# Patient Record
Sex: Male | Born: 1950
Health system: Southern US, Community
[De-identification: ages and names within clinical notes are randomized; demographics above are authoritative.]

## PROBLEM LIST (undated history)

## (undated) DIAGNOSIS — I739 Peripheral vascular disease, unspecified: Secondary | ICD-10-CM

## (undated) DIAGNOSIS — R059 Cough, unspecified: Secondary | ICD-10-CM

## (undated) DIAGNOSIS — IMO0002 Reserved for concepts with insufficient information to code with codable children: Secondary | ICD-10-CM

## (undated) DIAGNOSIS — I8290 Acute embolism and thrombosis of unspecified vein: Secondary | ICD-10-CM

## (undated) DIAGNOSIS — G629 Polyneuropathy, unspecified: Secondary | ICD-10-CM

## (undated) DIAGNOSIS — I10 Essential (primary) hypertension: Secondary | ICD-10-CM

## (undated) DIAGNOSIS — K219 Gastro-esophageal reflux disease without esophagitis: Secondary | ICD-10-CM

## (undated) DIAGNOSIS — I4891 Unspecified atrial fibrillation: Secondary | ICD-10-CM

## (undated) DIAGNOSIS — R05 Cough: Secondary | ICD-10-CM

## (undated) DIAGNOSIS — T7840XA Allergy, unspecified, initial encounter: Secondary | ICD-10-CM

## (undated) DIAGNOSIS — J449 Chronic obstructive pulmonary disease, unspecified: Secondary | ICD-10-CM

## (undated) DIAGNOSIS — F329 Major depressive disorder, single episode, unspecified: Secondary | ICD-10-CM

## (undated) DIAGNOSIS — K429 Umbilical hernia without obstruction or gangrene: Secondary | ICD-10-CM

## (undated) DIAGNOSIS — J309 Allergic rhinitis, unspecified: Secondary | ICD-10-CM

## (undated) DIAGNOSIS — J439 Emphysema, unspecified: Secondary | ICD-10-CM

## (undated) DIAGNOSIS — M21379 Foot drop, unspecified foot: Secondary | ICD-10-CM

## (undated) DIAGNOSIS — Z9889 Other specified postprocedural states: Secondary | ICD-10-CM

## (undated) DIAGNOSIS — F419 Anxiety disorder, unspecified: Secondary | ICD-10-CM

## (undated) DIAGNOSIS — F32A Depression, unspecified: Secondary | ICD-10-CM

## (undated) DIAGNOSIS — J45909 Unspecified asthma, uncomplicated: Secondary | ICD-10-CM

## (undated) DIAGNOSIS — R55 Syncope and collapse: Secondary | ICD-10-CM

## (undated) DIAGNOSIS — G709 Myoneural disorder, unspecified: Secondary | ICD-10-CM

## (undated) DIAGNOSIS — G8929 Other chronic pain: Secondary | ICD-10-CM

## (undated) DIAGNOSIS — J189 Pneumonia, unspecified organism: Secondary | ICD-10-CM

## (undated) DIAGNOSIS — R51 Headache: Secondary | ICD-10-CM

## (undated) DIAGNOSIS — I499 Cardiac arrhythmia, unspecified: Secondary | ICD-10-CM

## (undated) DIAGNOSIS — R112 Nausea with vomiting, unspecified: Secondary | ICD-10-CM

## (undated) DIAGNOSIS — M1990 Unspecified osteoarthritis, unspecified site: Secondary | ICD-10-CM

## (undated) DIAGNOSIS — R519 Headache, unspecified: Secondary | ICD-10-CM

## (undated) DIAGNOSIS — G5621 Lesion of ulnar nerve, right upper limb: Secondary | ICD-10-CM

## (undated) HISTORY — DX: Myoneural disorder, unspecified: G70.9

## (undated) HISTORY — DX: Allergy, unspecified, initial encounter: T78.40XA

## (undated) HISTORY — PX: HERNIA REPAIR: SHX51

## (undated) HISTORY — DX: Unspecified asthma, uncomplicated: J45.909

## (undated) HISTORY — DX: Acute embolism and thrombosis of unspecified vein: I82.90

## (undated) HISTORY — DX: Chronic obstructive pulmonary disease, unspecified: J44.9

## (undated) HISTORY — DX: Headache: R51

## (undated) HISTORY — DX: Depression, unspecified: F32.A

## (undated) HISTORY — PX: SHOULDER SURGERY: SHX246

## (undated) HISTORY — DX: Anxiety disorder, unspecified: F41.9

## (undated) HISTORY — DX: Emphysema, unspecified: J43.9

## (undated) HISTORY — DX: Major depressive disorder, single episode, unspecified: F32.9

## (undated) HISTORY — DX: Reserved for concepts with insufficient information to code with codable children: IMO0002

## (undated) HISTORY — DX: Headache, unspecified: R51.9

## (undated) HISTORY — DX: Other chronic pain: G89.29

## (undated) HISTORY — DX: Allergic rhinitis, unspecified: J30.9

---

## 2000-10-27 DIAGNOSIS — I8290 Acute embolism and thrombosis of unspecified vein: Secondary | ICD-10-CM

## 2000-10-27 HISTORY — PX: OTHER SURGICAL HISTORY: SHX169

## 2000-10-27 HISTORY — DX: Acute embolism and thrombosis of unspecified vein: I82.90

## 2000-12-18 ENCOUNTER — Ambulatory Visit (HOSPITAL_COMMUNITY): Admission: RE | Admit: 2000-12-18 | Discharge: 2000-12-18 | Payer: Self-pay | Admitting: Surgery

## 2000-12-18 ENCOUNTER — Encounter (INDEPENDENT_AMBULATORY_CARE_PROVIDER_SITE_OTHER): Payer: Self-pay | Admitting: Specialist

## 2000-12-18 ENCOUNTER — Encounter: Payer: Self-pay | Admitting: Surgery

## 2003-10-28 HISTORY — PX: ROTATOR CUFF REPAIR: SHX139

## 2004-11-01 ENCOUNTER — Ambulatory Visit: Payer: Self-pay | Admitting: Internal Medicine

## 2004-11-08 ENCOUNTER — Ambulatory Visit: Payer: Self-pay | Admitting: Internal Medicine

## 2004-11-15 ENCOUNTER — Ambulatory Visit: Payer: Self-pay | Admitting: Internal Medicine

## 2004-11-22 ENCOUNTER — Ambulatory Visit: Payer: Self-pay | Admitting: Internal Medicine

## 2004-11-29 ENCOUNTER — Ambulatory Visit: Payer: Self-pay | Admitting: Internal Medicine

## 2004-12-06 ENCOUNTER — Ambulatory Visit: Payer: Self-pay | Admitting: Internal Medicine

## 2004-12-13 ENCOUNTER — Ambulatory Visit: Payer: Self-pay | Admitting: Internal Medicine

## 2004-12-18 ENCOUNTER — Ambulatory Visit: Payer: Self-pay | Admitting: Internal Medicine

## 2004-12-26 ENCOUNTER — Ambulatory Visit: Payer: Self-pay | Admitting: Internal Medicine

## 2005-01-03 ENCOUNTER — Ambulatory Visit: Payer: Self-pay | Admitting: Internal Medicine

## 2005-01-10 ENCOUNTER — Ambulatory Visit: Payer: Self-pay | Admitting: Internal Medicine

## 2005-01-17 ENCOUNTER — Ambulatory Visit: Payer: Self-pay | Admitting: Internal Medicine

## 2005-01-23 ENCOUNTER — Ambulatory Visit: Payer: Self-pay | Admitting: Internal Medicine

## 2005-01-30 ENCOUNTER — Ambulatory Visit: Payer: Self-pay | Admitting: Internal Medicine

## 2005-02-06 ENCOUNTER — Ambulatory Visit: Payer: Self-pay | Admitting: Internal Medicine

## 2005-02-14 ENCOUNTER — Ambulatory Visit: Payer: Self-pay | Admitting: Internal Medicine

## 2005-02-21 ENCOUNTER — Ambulatory Visit: Payer: Self-pay | Admitting: Internal Medicine

## 2005-03-07 ENCOUNTER — Ambulatory Visit: Payer: Self-pay | Admitting: Internal Medicine

## 2005-03-09 ENCOUNTER — Emergency Department (HOSPITAL_COMMUNITY): Admission: EM | Admit: 2005-03-09 | Discharge: 2005-03-09 | Payer: Self-pay | Admitting: Emergency Medicine

## 2005-03-13 ENCOUNTER — Ambulatory Visit: Payer: Self-pay | Admitting: Internal Medicine

## 2005-03-28 ENCOUNTER — Ambulatory Visit: Payer: Self-pay | Admitting: Internal Medicine

## 2005-04-03 ENCOUNTER — Ambulatory Visit: Payer: Self-pay | Admitting: Internal Medicine

## 2005-04-10 ENCOUNTER — Ambulatory Visit: Payer: Self-pay | Admitting: Internal Medicine

## 2005-04-18 ENCOUNTER — Ambulatory Visit: Payer: Self-pay | Admitting: Internal Medicine

## 2005-04-22 ENCOUNTER — Ambulatory Visit: Payer: Self-pay | Admitting: Internal Medicine

## 2005-05-01 ENCOUNTER — Ambulatory Visit: Payer: Self-pay | Admitting: Internal Medicine

## 2005-05-02 ENCOUNTER — Ambulatory Visit: Payer: Self-pay | Admitting: Internal Medicine

## 2005-05-09 ENCOUNTER — Ambulatory Visit: Payer: Self-pay | Admitting: Internal Medicine

## 2005-05-15 ENCOUNTER — Ambulatory Visit: Payer: Self-pay | Admitting: Internal Medicine

## 2005-05-20 ENCOUNTER — Ambulatory Visit: Payer: Self-pay | Admitting: Internal Medicine

## 2005-05-30 ENCOUNTER — Ambulatory Visit: Payer: Self-pay | Admitting: Internal Medicine

## 2005-06-04 ENCOUNTER — Ambulatory Visit: Payer: Self-pay | Admitting: Internal Medicine

## 2005-06-20 ENCOUNTER — Ambulatory Visit: Payer: Self-pay | Admitting: Internal Medicine

## 2005-06-26 ENCOUNTER — Ambulatory Visit: Payer: Self-pay | Admitting: Internal Medicine

## 2005-07-04 ENCOUNTER — Ambulatory Visit: Payer: Self-pay | Admitting: Internal Medicine

## 2005-07-11 ENCOUNTER — Ambulatory Visit: Payer: Self-pay | Admitting: Internal Medicine

## 2005-07-18 ENCOUNTER — Ambulatory Visit: Payer: Self-pay | Admitting: Internal Medicine

## 2005-07-25 ENCOUNTER — Ambulatory Visit: Payer: Self-pay | Admitting: Internal Medicine

## 2005-08-01 ENCOUNTER — Ambulatory Visit: Payer: Self-pay | Admitting: Internal Medicine

## 2005-08-08 ENCOUNTER — Ambulatory Visit: Payer: Self-pay | Admitting: Internal Medicine

## 2005-08-15 ENCOUNTER — Ambulatory Visit: Payer: Self-pay | Admitting: Internal Medicine

## 2005-08-22 ENCOUNTER — Ambulatory Visit: Payer: Self-pay | Admitting: Internal Medicine

## 2005-08-29 ENCOUNTER — Ambulatory Visit: Payer: Self-pay | Admitting: Internal Medicine

## 2005-09-05 ENCOUNTER — Ambulatory Visit: Payer: Self-pay | Admitting: Internal Medicine

## 2005-09-11 ENCOUNTER — Ambulatory Visit: Payer: Self-pay | Admitting: Internal Medicine

## 2005-09-12 ENCOUNTER — Ambulatory Visit: Payer: Self-pay | Admitting: Internal Medicine

## 2005-09-26 ENCOUNTER — Ambulatory Visit: Payer: Self-pay | Admitting: Internal Medicine

## 2005-10-03 ENCOUNTER — Ambulatory Visit: Payer: Self-pay | Admitting: Internal Medicine

## 2005-10-10 ENCOUNTER — Ambulatory Visit: Payer: Self-pay | Admitting: Internal Medicine

## 2005-10-17 ENCOUNTER — Ambulatory Visit: Payer: Self-pay | Admitting: Internal Medicine

## 2005-10-28 ENCOUNTER — Ambulatory Visit: Payer: Self-pay | Admitting: Internal Medicine

## 2005-11-07 ENCOUNTER — Ambulatory Visit: Payer: Self-pay | Admitting: Internal Medicine

## 2005-11-14 ENCOUNTER — Ambulatory Visit: Payer: Self-pay | Admitting: Internal Medicine

## 2005-11-21 ENCOUNTER — Ambulatory Visit: Payer: Self-pay | Admitting: Internal Medicine

## 2005-11-28 ENCOUNTER — Ambulatory Visit: Payer: Self-pay | Admitting: Internal Medicine

## 2005-12-05 ENCOUNTER — Ambulatory Visit: Payer: Self-pay | Admitting: Internal Medicine

## 2005-12-12 ENCOUNTER — Ambulatory Visit: Payer: Self-pay | Admitting: Internal Medicine

## 2005-12-19 ENCOUNTER — Ambulatory Visit: Payer: Self-pay | Admitting: Internal Medicine

## 2005-12-26 ENCOUNTER — Ambulatory Visit: Payer: Self-pay | Admitting: Internal Medicine

## 2006-01-02 ENCOUNTER — Ambulatory Visit: Payer: Self-pay | Admitting: Internal Medicine

## 2006-01-09 ENCOUNTER — Ambulatory Visit: Payer: Self-pay | Admitting: Internal Medicine

## 2006-01-16 ENCOUNTER — Ambulatory Visit: Payer: Self-pay | Admitting: Internal Medicine

## 2006-01-23 ENCOUNTER — Ambulatory Visit: Payer: Self-pay | Admitting: Internal Medicine

## 2006-01-29 ENCOUNTER — Ambulatory Visit: Payer: Self-pay | Admitting: Internal Medicine

## 2006-02-02 ENCOUNTER — Ambulatory Visit: Payer: Self-pay | Admitting: Internal Medicine

## 2006-02-13 ENCOUNTER — Ambulatory Visit: Payer: Self-pay | Admitting: Internal Medicine

## 2006-02-20 ENCOUNTER — Ambulatory Visit: Payer: Self-pay | Admitting: Internal Medicine

## 2006-02-27 ENCOUNTER — Ambulatory Visit: Payer: Self-pay | Admitting: Internal Medicine

## 2006-03-06 ENCOUNTER — Ambulatory Visit: Payer: Self-pay | Admitting: Internal Medicine

## 2006-03-13 ENCOUNTER — Ambulatory Visit: Payer: Self-pay | Admitting: Internal Medicine

## 2006-03-20 ENCOUNTER — Ambulatory Visit: Payer: Self-pay | Admitting: Internal Medicine

## 2006-03-27 ENCOUNTER — Ambulatory Visit: Payer: Self-pay | Admitting: Internal Medicine

## 2006-04-03 ENCOUNTER — Ambulatory Visit: Payer: Self-pay | Admitting: Internal Medicine

## 2006-04-24 ENCOUNTER — Ambulatory Visit: Payer: Self-pay | Admitting: Internal Medicine

## 2006-05-01 ENCOUNTER — Ambulatory Visit: Payer: Self-pay | Admitting: Internal Medicine

## 2006-05-08 ENCOUNTER — Ambulatory Visit: Payer: Self-pay | Admitting: Internal Medicine

## 2006-05-15 ENCOUNTER — Ambulatory Visit: Payer: Self-pay | Admitting: Internal Medicine

## 2006-05-22 ENCOUNTER — Ambulatory Visit: Payer: Self-pay | Admitting: Internal Medicine

## 2006-05-28 ENCOUNTER — Ambulatory Visit: Payer: Self-pay | Admitting: Internal Medicine

## 2006-06-02 ENCOUNTER — Ambulatory Visit: Payer: Self-pay | Admitting: Internal Medicine

## 2006-06-12 ENCOUNTER — Ambulatory Visit: Payer: Self-pay | Admitting: Internal Medicine

## 2006-06-19 ENCOUNTER — Ambulatory Visit: Payer: Self-pay | Admitting: Internal Medicine

## 2006-06-26 ENCOUNTER — Ambulatory Visit: Payer: Self-pay | Admitting: Internal Medicine

## 2006-07-03 ENCOUNTER — Ambulatory Visit: Payer: Self-pay | Admitting: Internal Medicine

## 2006-07-10 ENCOUNTER — Ambulatory Visit: Payer: Self-pay | Admitting: Internal Medicine

## 2006-07-17 ENCOUNTER — Ambulatory Visit: Payer: Self-pay | Admitting: Internal Medicine

## 2006-07-24 ENCOUNTER — Ambulatory Visit: Payer: Self-pay | Admitting: Internal Medicine

## 2006-07-31 ENCOUNTER — Ambulatory Visit: Payer: Self-pay | Admitting: Internal Medicine

## 2006-08-07 ENCOUNTER — Ambulatory Visit: Payer: Self-pay | Admitting: Internal Medicine

## 2006-08-14 ENCOUNTER — Ambulatory Visit: Payer: Self-pay | Admitting: Internal Medicine

## 2006-08-21 ENCOUNTER — Ambulatory Visit: Payer: Self-pay | Admitting: Internal Medicine

## 2006-08-28 ENCOUNTER — Ambulatory Visit: Payer: Self-pay | Admitting: Internal Medicine

## 2006-09-04 ENCOUNTER — Ambulatory Visit: Payer: Self-pay | Admitting: Internal Medicine

## 2006-09-11 ENCOUNTER — Ambulatory Visit: Payer: Self-pay | Admitting: Internal Medicine

## 2006-09-18 ENCOUNTER — Ambulatory Visit: Payer: Self-pay | Admitting: Internal Medicine

## 2006-09-25 ENCOUNTER — Ambulatory Visit: Payer: Self-pay | Admitting: Internal Medicine

## 2006-10-02 ENCOUNTER — Ambulatory Visit: Payer: Self-pay | Admitting: Internal Medicine

## 2006-10-09 ENCOUNTER — Ambulatory Visit: Payer: Self-pay | Admitting: Internal Medicine

## 2006-10-15 ENCOUNTER — Ambulatory Visit: Payer: Self-pay | Admitting: Internal Medicine

## 2006-10-16 ENCOUNTER — Ambulatory Visit: Payer: Self-pay | Admitting: Internal Medicine

## 2006-10-23 ENCOUNTER — Ambulatory Visit: Payer: Self-pay | Admitting: Internal Medicine

## 2006-10-30 ENCOUNTER — Ambulatory Visit: Payer: Self-pay | Admitting: Internal Medicine

## 2006-11-06 ENCOUNTER — Encounter: Admission: RE | Admit: 2006-11-06 | Discharge: 2006-11-06 | Payer: Self-pay | Admitting: Orthopaedic Surgery

## 2006-11-06 ENCOUNTER — Ambulatory Visit: Payer: Self-pay | Admitting: Internal Medicine

## 2006-11-13 ENCOUNTER — Ambulatory Visit: Payer: Self-pay | Admitting: Internal Medicine

## 2006-11-20 ENCOUNTER — Ambulatory Visit: Payer: Self-pay | Admitting: Internal Medicine

## 2006-11-27 ENCOUNTER — Ambulatory Visit: Payer: Self-pay | Admitting: Internal Medicine

## 2006-12-01 ENCOUNTER — Ambulatory Visit: Payer: Self-pay | Admitting: Internal Medicine

## 2006-12-04 ENCOUNTER — Ambulatory Visit: Payer: Self-pay | Admitting: Internal Medicine

## 2006-12-10 ENCOUNTER — Ambulatory Visit: Payer: Self-pay | Admitting: Internal Medicine

## 2006-12-18 ENCOUNTER — Ambulatory Visit: Payer: Self-pay | Admitting: Internal Medicine

## 2006-12-25 ENCOUNTER — Ambulatory Visit: Payer: Self-pay | Admitting: Internal Medicine

## 2007-01-01 ENCOUNTER — Ambulatory Visit: Payer: Self-pay | Admitting: Internal Medicine

## 2007-01-08 ENCOUNTER — Ambulatory Visit: Payer: Self-pay | Admitting: Internal Medicine

## 2007-01-22 ENCOUNTER — Ambulatory Visit: Payer: Self-pay | Admitting: Internal Medicine

## 2007-01-29 ENCOUNTER — Ambulatory Visit: Payer: Self-pay | Admitting: Internal Medicine

## 2007-02-05 ENCOUNTER — Ambulatory Visit: Payer: Self-pay | Admitting: Internal Medicine

## 2007-02-11 ENCOUNTER — Ambulatory Visit: Payer: Self-pay | Admitting: Internal Medicine

## 2007-02-12 ENCOUNTER — Ambulatory Visit: Payer: Self-pay | Admitting: Internal Medicine

## 2007-02-19 ENCOUNTER — Ambulatory Visit: Payer: Self-pay | Admitting: Internal Medicine

## 2007-02-26 ENCOUNTER — Ambulatory Visit: Payer: Self-pay | Admitting: Internal Medicine

## 2007-03-05 ENCOUNTER — Ambulatory Visit: Payer: Self-pay | Admitting: Internal Medicine

## 2007-03-11 ENCOUNTER — Ambulatory Visit: Payer: Self-pay | Admitting: Internal Medicine

## 2007-03-19 ENCOUNTER — Ambulatory Visit: Payer: Self-pay | Admitting: Internal Medicine

## 2007-03-19 ENCOUNTER — Encounter: Admission: RE | Admit: 2007-03-19 | Discharge: 2007-03-19 | Payer: Self-pay | Admitting: Orthopaedic Surgery

## 2007-03-26 ENCOUNTER — Ambulatory Visit: Payer: Self-pay | Admitting: Internal Medicine

## 2007-04-02 ENCOUNTER — Ambulatory Visit: Payer: Self-pay | Admitting: Internal Medicine

## 2007-04-08 ENCOUNTER — Ambulatory Visit: Payer: Self-pay | Admitting: Internal Medicine

## 2007-04-16 ENCOUNTER — Ambulatory Visit: Payer: Self-pay | Admitting: Internal Medicine

## 2007-04-23 ENCOUNTER — Ambulatory Visit: Payer: Self-pay | Admitting: Internal Medicine

## 2007-04-29 ENCOUNTER — Ambulatory Visit: Payer: Self-pay | Admitting: Internal Medicine

## 2007-05-14 ENCOUNTER — Ambulatory Visit: Payer: Self-pay | Admitting: Internal Medicine

## 2007-05-20 ENCOUNTER — Ambulatory Visit: Payer: Self-pay | Admitting: Internal Medicine

## 2007-05-28 ENCOUNTER — Ambulatory Visit: Payer: Self-pay | Admitting: Internal Medicine

## 2007-06-04 ENCOUNTER — Ambulatory Visit: Payer: Self-pay | Admitting: Internal Medicine

## 2007-06-11 ENCOUNTER — Ambulatory Visit: Payer: Self-pay | Admitting: Internal Medicine

## 2007-06-17 ENCOUNTER — Ambulatory Visit: Payer: Self-pay | Admitting: Internal Medicine

## 2007-06-18 ENCOUNTER — Ambulatory Visit: Payer: Self-pay | Admitting: Internal Medicine

## 2007-06-24 ENCOUNTER — Ambulatory Visit: Payer: Self-pay | Admitting: Internal Medicine

## 2007-07-02 ENCOUNTER — Ambulatory Visit: Payer: Self-pay | Admitting: Internal Medicine

## 2007-07-09 ENCOUNTER — Ambulatory Visit: Payer: Self-pay | Admitting: Internal Medicine

## 2007-07-16 ENCOUNTER — Ambulatory Visit: Payer: Self-pay | Admitting: Internal Medicine

## 2007-07-22 ENCOUNTER — Ambulatory Visit: Payer: Self-pay | Admitting: Internal Medicine

## 2007-07-29 ENCOUNTER — Ambulatory Visit: Payer: Self-pay | Admitting: Internal Medicine

## 2007-08-05 ENCOUNTER — Ambulatory Visit: Payer: Self-pay | Admitting: Internal Medicine

## 2007-08-13 ENCOUNTER — Ambulatory Visit: Payer: Self-pay | Admitting: Internal Medicine

## 2007-08-18 DIAGNOSIS — J45909 Unspecified asthma, uncomplicated: Secondary | ICD-10-CM | POA: Insufficient documentation

## 2007-08-18 DIAGNOSIS — J309 Allergic rhinitis, unspecified: Secondary | ICD-10-CM | POA: Insufficient documentation

## 2007-08-19 ENCOUNTER — Ambulatory Visit: Payer: Self-pay | Admitting: Internal Medicine

## 2007-08-27 ENCOUNTER — Ambulatory Visit: Payer: Self-pay | Admitting: Internal Medicine

## 2007-09-02 ENCOUNTER — Ambulatory Visit: Payer: Self-pay | Admitting: Internal Medicine

## 2007-09-10 ENCOUNTER — Ambulatory Visit: Payer: Self-pay | Admitting: Internal Medicine

## 2007-09-15 ENCOUNTER — Ambulatory Visit: Payer: Self-pay | Admitting: Internal Medicine

## 2007-09-22 ENCOUNTER — Ambulatory Visit: Payer: Self-pay | Admitting: Internal Medicine

## 2007-10-01 ENCOUNTER — Ambulatory Visit: Payer: Self-pay | Admitting: Internal Medicine

## 2007-10-08 ENCOUNTER — Ambulatory Visit: Payer: Self-pay | Admitting: Internal Medicine

## 2007-10-15 ENCOUNTER — Ambulatory Visit: Payer: Self-pay | Admitting: Internal Medicine

## 2007-10-22 ENCOUNTER — Ambulatory Visit: Payer: Self-pay | Admitting: Internal Medicine

## 2007-10-25 ENCOUNTER — Ambulatory Visit: Payer: Self-pay | Admitting: Internal Medicine

## 2007-10-29 ENCOUNTER — Ambulatory Visit: Payer: Self-pay | Admitting: Internal Medicine

## 2007-11-05 ENCOUNTER — Ambulatory Visit: Payer: Self-pay | Admitting: Family Medicine

## 2007-11-05 ENCOUNTER — Ambulatory Visit: Payer: Self-pay | Admitting: Internal Medicine

## 2007-11-10 ENCOUNTER — Ambulatory Visit: Payer: Self-pay | Admitting: Internal Medicine

## 2007-11-19 ENCOUNTER — Ambulatory Visit: Payer: Self-pay | Admitting: Internal Medicine

## 2007-11-26 ENCOUNTER — Ambulatory Visit: Payer: Self-pay | Admitting: Internal Medicine

## 2007-12-03 ENCOUNTER — Ambulatory Visit: Payer: Self-pay | Admitting: Internal Medicine

## 2007-12-09 ENCOUNTER — Ambulatory Visit: Payer: Self-pay | Admitting: Internal Medicine

## 2007-12-17 ENCOUNTER — Ambulatory Visit: Payer: Self-pay | Admitting: Internal Medicine

## 2007-12-24 ENCOUNTER — Ambulatory Visit: Payer: Self-pay | Admitting: Internal Medicine

## 2007-12-31 ENCOUNTER — Ambulatory Visit: Payer: Self-pay | Admitting: Internal Medicine

## 2008-01-07 ENCOUNTER — Ambulatory Visit: Payer: Self-pay | Admitting: Internal Medicine

## 2008-01-14 ENCOUNTER — Ambulatory Visit: Payer: Self-pay | Admitting: Internal Medicine

## 2008-01-21 ENCOUNTER — Ambulatory Visit: Payer: Self-pay | Admitting: Internal Medicine

## 2008-01-25 ENCOUNTER — Ambulatory Visit: Payer: Self-pay | Admitting: Internal Medicine

## 2008-01-28 ENCOUNTER — Ambulatory Visit: Payer: Self-pay | Admitting: Internal Medicine

## 2008-01-30 DIAGNOSIS — J45909 Unspecified asthma, uncomplicated: Secondary | ICD-10-CM | POA: Insufficient documentation

## 2008-01-30 DIAGNOSIS — J449 Chronic obstructive pulmonary disease, unspecified: Secondary | ICD-10-CM | POA: Insufficient documentation

## 2008-02-03 ENCOUNTER — Ambulatory Visit: Payer: Self-pay | Admitting: Internal Medicine

## 2008-02-11 ENCOUNTER — Ambulatory Visit: Payer: Self-pay | Admitting: Internal Medicine

## 2008-02-15 ENCOUNTER — Ambulatory Visit: Payer: Self-pay | Admitting: Internal Medicine

## 2008-02-16 ENCOUNTER — Ambulatory Visit: Payer: Self-pay | Admitting: Internal Medicine

## 2008-02-18 ENCOUNTER — Ambulatory Visit: Payer: Self-pay | Admitting: Internal Medicine

## 2008-02-21 ENCOUNTER — Ambulatory Visit: Payer: Self-pay | Admitting: Internal Medicine

## 2008-02-25 ENCOUNTER — Ambulatory Visit: Payer: Self-pay | Admitting: Internal Medicine

## 2008-02-28 ENCOUNTER — Ambulatory Visit: Payer: Self-pay | Admitting: Internal Medicine

## 2008-03-03 ENCOUNTER — Ambulatory Visit: Payer: Self-pay | Admitting: Internal Medicine

## 2008-03-06 ENCOUNTER — Ambulatory Visit: Payer: Self-pay | Admitting: Internal Medicine

## 2008-03-10 ENCOUNTER — Ambulatory Visit: Payer: Self-pay | Admitting: Internal Medicine

## 2008-03-13 ENCOUNTER — Ambulatory Visit: Payer: Self-pay | Admitting: Internal Medicine

## 2008-03-17 ENCOUNTER — Ambulatory Visit: Payer: Self-pay | Admitting: Internal Medicine

## 2008-03-21 ENCOUNTER — Ambulatory Visit: Payer: Self-pay | Admitting: Internal Medicine

## 2008-03-24 ENCOUNTER — Ambulatory Visit: Payer: Self-pay | Admitting: Internal Medicine

## 2008-03-31 ENCOUNTER — Ambulatory Visit: Payer: Self-pay | Admitting: Internal Medicine

## 2008-04-03 ENCOUNTER — Ambulatory Visit: Payer: Self-pay | Admitting: Internal Medicine

## 2008-04-07 ENCOUNTER — Ambulatory Visit: Payer: Self-pay | Admitting: Internal Medicine

## 2008-04-10 ENCOUNTER — Ambulatory Visit: Payer: Self-pay | Admitting: Internal Medicine

## 2008-04-14 ENCOUNTER — Ambulatory Visit: Payer: Self-pay | Admitting: Internal Medicine

## 2008-04-18 ENCOUNTER — Ambulatory Visit: Payer: Self-pay | Admitting: Internal Medicine

## 2008-04-21 ENCOUNTER — Ambulatory Visit: Payer: Self-pay | Admitting: Internal Medicine

## 2008-04-24 ENCOUNTER — Ambulatory Visit: Payer: Self-pay | Admitting: Internal Medicine

## 2008-04-26 ENCOUNTER — Ambulatory Visit: Payer: Self-pay | Admitting: Internal Medicine

## 2008-04-27 ENCOUNTER — Ambulatory Visit: Payer: Self-pay | Admitting: Internal Medicine

## 2008-05-01 ENCOUNTER — Ambulatory Visit: Payer: Self-pay | Admitting: Internal Medicine

## 2008-05-05 ENCOUNTER — Ambulatory Visit: Payer: Self-pay | Admitting: Internal Medicine

## 2008-05-09 ENCOUNTER — Ambulatory Visit: Payer: Self-pay | Admitting: Internal Medicine

## 2008-05-12 ENCOUNTER — Ambulatory Visit: Payer: Self-pay | Admitting: Internal Medicine

## 2008-05-16 ENCOUNTER — Ambulatory Visit: Payer: Self-pay | Admitting: Internal Medicine

## 2008-05-19 ENCOUNTER — Ambulatory Visit: Payer: Self-pay | Admitting: Internal Medicine

## 2008-05-22 ENCOUNTER — Ambulatory Visit: Payer: Self-pay | Admitting: Internal Medicine

## 2008-05-25 ENCOUNTER — Ambulatory Visit: Payer: Self-pay | Admitting: Internal Medicine

## 2008-05-30 ENCOUNTER — Ambulatory Visit: Payer: Self-pay | Admitting: Internal Medicine

## 2008-06-02 ENCOUNTER — Ambulatory Visit: Payer: Self-pay | Admitting: Internal Medicine

## 2008-06-09 ENCOUNTER — Ambulatory Visit: Payer: Self-pay | Admitting: Internal Medicine

## 2008-06-16 ENCOUNTER — Ambulatory Visit: Payer: Self-pay | Admitting: Internal Medicine

## 2008-06-23 ENCOUNTER — Ambulatory Visit: Payer: Self-pay | Admitting: Internal Medicine

## 2008-06-30 ENCOUNTER — Ambulatory Visit: Payer: Self-pay | Admitting: Internal Medicine

## 2008-07-07 ENCOUNTER — Ambulatory Visit: Payer: Self-pay | Admitting: Internal Medicine

## 2008-07-14 ENCOUNTER — Ambulatory Visit: Payer: Self-pay | Admitting: Internal Medicine

## 2008-07-21 ENCOUNTER — Ambulatory Visit: Payer: Self-pay | Admitting: Internal Medicine

## 2008-07-28 ENCOUNTER — Ambulatory Visit: Payer: Self-pay | Admitting: Internal Medicine

## 2008-08-04 ENCOUNTER — Ambulatory Visit: Payer: Self-pay | Admitting: Internal Medicine

## 2008-08-11 ENCOUNTER — Ambulatory Visit: Payer: Self-pay | Admitting: Internal Medicine

## 2008-08-16 ENCOUNTER — Emergency Department (HOSPITAL_COMMUNITY): Admission: EM | Admit: 2008-08-16 | Discharge: 2008-08-17 | Payer: Self-pay | Admitting: Emergency Medicine

## 2008-08-18 ENCOUNTER — Ambulatory Visit: Payer: Self-pay | Admitting: Internal Medicine

## 2008-08-24 ENCOUNTER — Ambulatory Visit: Payer: Self-pay | Admitting: Internal Medicine

## 2008-08-25 ENCOUNTER — Ambulatory Visit: Payer: Self-pay | Admitting: Internal Medicine

## 2008-09-01 ENCOUNTER — Ambulatory Visit: Payer: Self-pay | Admitting: Internal Medicine

## 2008-09-08 ENCOUNTER — Ambulatory Visit: Payer: Self-pay | Admitting: Internal Medicine

## 2008-09-15 ENCOUNTER — Ambulatory Visit: Payer: Self-pay | Admitting: Internal Medicine

## 2008-09-22 ENCOUNTER — Ambulatory Visit: Payer: Self-pay | Admitting: Internal Medicine

## 2008-09-29 ENCOUNTER — Ambulatory Visit: Payer: Self-pay | Admitting: Pulmonary Disease

## 2008-09-29 ENCOUNTER — Ambulatory Visit: Payer: Self-pay | Admitting: Internal Medicine

## 2008-10-06 ENCOUNTER — Ambulatory Visit: Payer: Self-pay | Admitting: Internal Medicine

## 2008-10-13 ENCOUNTER — Ambulatory Visit: Payer: Self-pay | Admitting: Internal Medicine

## 2008-10-16 ENCOUNTER — Ambulatory Visit: Payer: Self-pay | Admitting: Internal Medicine

## 2008-10-30 ENCOUNTER — Ambulatory Visit: Payer: Self-pay | Admitting: Internal Medicine

## 2008-11-10 ENCOUNTER — Ambulatory Visit: Payer: Self-pay | Admitting: Internal Medicine

## 2008-11-16 ENCOUNTER — Ambulatory Visit: Payer: Self-pay | Admitting: Internal Medicine

## 2008-11-16 ENCOUNTER — Ambulatory Visit: Payer: Self-pay | Admitting: Family Medicine

## 2008-11-23 ENCOUNTER — Ambulatory Visit: Payer: Self-pay | Admitting: Internal Medicine

## 2008-11-28 ENCOUNTER — Ambulatory Visit: Payer: Self-pay | Admitting: Physical Medicine & Rehabilitation

## 2008-11-28 ENCOUNTER — Encounter
Admission: RE | Admit: 2008-11-28 | Discharge: 2009-02-26 | Payer: Self-pay | Admitting: Physical Medicine & Rehabilitation

## 2008-12-01 ENCOUNTER — Ambulatory Visit: Payer: Self-pay | Admitting: Internal Medicine

## 2008-12-05 ENCOUNTER — Encounter
Admission: RE | Admit: 2008-12-05 | Discharge: 2009-01-18 | Payer: Self-pay | Admitting: Physical Medicine & Rehabilitation

## 2008-12-07 ENCOUNTER — Ambulatory Visit: Payer: Self-pay | Admitting: Internal Medicine

## 2008-12-15 ENCOUNTER — Ambulatory Visit: Payer: Self-pay | Admitting: Internal Medicine

## 2008-12-18 ENCOUNTER — Ambulatory Visit: Payer: Self-pay | Admitting: Physical Medicine & Rehabilitation

## 2008-12-22 ENCOUNTER — Ambulatory Visit: Payer: Self-pay | Admitting: Internal Medicine

## 2008-12-28 ENCOUNTER — Ambulatory Visit: Payer: Self-pay | Admitting: Internal Medicine

## 2009-01-05 ENCOUNTER — Ambulatory Visit: Payer: Self-pay | Admitting: Internal Medicine

## 2009-01-08 ENCOUNTER — Ambulatory Visit: Payer: Self-pay | Admitting: Physical Medicine & Rehabilitation

## 2009-01-08 ENCOUNTER — Ambulatory Visit: Payer: Self-pay | Admitting: Internal Medicine

## 2009-01-12 ENCOUNTER — Ambulatory Visit: Payer: Self-pay | Admitting: Internal Medicine

## 2009-01-19 ENCOUNTER — Ambulatory Visit: Payer: Self-pay | Admitting: Internal Medicine

## 2009-02-02 ENCOUNTER — Ambulatory Visit: Payer: Self-pay | Admitting: Internal Medicine

## 2009-02-09 ENCOUNTER — Ambulatory Visit: Payer: Self-pay | Admitting: Internal Medicine

## 2009-02-12 ENCOUNTER — Ambulatory Visit: Payer: Self-pay | Admitting: Physical Medicine & Rehabilitation

## 2009-02-16 ENCOUNTER — Ambulatory Visit: Payer: Self-pay | Admitting: Internal Medicine

## 2009-03-02 ENCOUNTER — Ambulatory Visit: Payer: Self-pay | Admitting: Internal Medicine

## 2009-03-08 ENCOUNTER — Ambulatory Visit: Payer: Self-pay | Admitting: Internal Medicine

## 2009-03-13 ENCOUNTER — Encounter
Admission: RE | Admit: 2009-03-13 | Discharge: 2009-04-24 | Payer: Self-pay | Admitting: Physical Medicine & Rehabilitation

## 2009-03-15 ENCOUNTER — Ambulatory Visit: Payer: Self-pay | Admitting: Physical Medicine & Rehabilitation

## 2009-03-15 ENCOUNTER — Ambulatory Visit: Payer: Self-pay | Admitting: Internal Medicine

## 2009-03-23 ENCOUNTER — Ambulatory Visit: Payer: Self-pay | Admitting: Internal Medicine

## 2009-03-30 ENCOUNTER — Ambulatory Visit: Payer: Self-pay | Admitting: Internal Medicine

## 2009-04-05 ENCOUNTER — Ambulatory Visit: Payer: Self-pay | Admitting: Internal Medicine

## 2009-04-13 ENCOUNTER — Ambulatory Visit: Payer: Self-pay | Admitting: Internal Medicine

## 2009-04-13 ENCOUNTER — Ambulatory Visit: Payer: Self-pay | Admitting: Physical Medicine & Rehabilitation

## 2009-04-16 ENCOUNTER — Ambulatory Visit: Payer: Self-pay | Admitting: Internal Medicine

## 2009-04-16 DIAGNOSIS — R29818 Other symptoms and signs involving the nervous system: Secondary | ICD-10-CM

## 2009-04-16 DIAGNOSIS — G8929 Other chronic pain: Secondary | ICD-10-CM | POA: Insufficient documentation

## 2009-04-18 ENCOUNTER — Encounter: Payer: Self-pay | Admitting: Internal Medicine

## 2009-04-19 ENCOUNTER — Telehealth (INDEPENDENT_AMBULATORY_CARE_PROVIDER_SITE_OTHER): Payer: Self-pay | Admitting: *Deleted

## 2009-04-20 ENCOUNTER — Ambulatory Visit: Payer: Self-pay | Admitting: Internal Medicine

## 2009-04-26 ENCOUNTER — Ambulatory Visit: Payer: Self-pay | Admitting: Internal Medicine

## 2009-05-04 ENCOUNTER — Ambulatory Visit: Payer: Self-pay | Admitting: Internal Medicine

## 2009-05-11 ENCOUNTER — Ambulatory Visit: Payer: Self-pay | Admitting: Internal Medicine

## 2009-05-11 ENCOUNTER — Encounter
Admission: RE | Admit: 2009-05-11 | Discharge: 2009-08-09 | Payer: Self-pay | Admitting: Physical Medicine & Rehabilitation

## 2009-05-11 ENCOUNTER — Ambulatory Visit: Payer: Self-pay | Admitting: Physical Medicine & Rehabilitation

## 2009-05-14 ENCOUNTER — Ambulatory Visit: Payer: Self-pay | Admitting: Internal Medicine

## 2009-05-18 ENCOUNTER — Ambulatory Visit: Payer: Self-pay | Admitting: Internal Medicine

## 2009-05-25 ENCOUNTER — Ambulatory Visit: Payer: Self-pay | Admitting: Internal Medicine

## 2009-06-01 ENCOUNTER — Ambulatory Visit: Payer: Self-pay | Admitting: Internal Medicine

## 2009-06-08 ENCOUNTER — Ambulatory Visit: Payer: Self-pay | Admitting: Physical Medicine & Rehabilitation

## 2009-06-08 ENCOUNTER — Ambulatory Visit: Payer: Self-pay | Admitting: Internal Medicine

## 2009-06-15 ENCOUNTER — Ambulatory Visit: Payer: Self-pay | Admitting: Internal Medicine

## 2009-06-22 ENCOUNTER — Ambulatory Visit: Payer: Self-pay | Admitting: Internal Medicine

## 2009-06-29 ENCOUNTER — Ambulatory Visit: Payer: Self-pay | Admitting: Internal Medicine

## 2009-07-13 ENCOUNTER — Ambulatory Visit: Payer: Self-pay | Admitting: Physical Medicine & Rehabilitation

## 2009-07-13 ENCOUNTER — Ambulatory Visit: Payer: Self-pay | Admitting: Internal Medicine

## 2009-07-19 ENCOUNTER — Ambulatory Visit: Payer: Self-pay | Admitting: Internal Medicine

## 2009-07-27 ENCOUNTER — Ambulatory Visit: Payer: Self-pay | Admitting: Internal Medicine

## 2009-07-30 ENCOUNTER — Ambulatory Visit: Payer: Self-pay | Admitting: Internal Medicine

## 2009-08-03 ENCOUNTER — Ambulatory Visit: Payer: Self-pay | Admitting: Internal Medicine

## 2009-08-10 ENCOUNTER — Ambulatory Visit: Payer: Self-pay | Admitting: Internal Medicine

## 2009-08-17 ENCOUNTER — Ambulatory Visit: Payer: Self-pay | Admitting: Internal Medicine

## 2009-08-24 ENCOUNTER — Ambulatory Visit: Payer: Self-pay | Admitting: Family Medicine

## 2009-08-24 ENCOUNTER — Ambulatory Visit: Payer: Self-pay | Admitting: Internal Medicine

## 2009-08-31 ENCOUNTER — Ambulatory Visit: Payer: Self-pay | Admitting: Internal Medicine

## 2009-09-07 ENCOUNTER — Ambulatory Visit: Payer: Self-pay | Admitting: Internal Medicine

## 2009-09-14 ENCOUNTER — Ambulatory Visit: Payer: Self-pay | Admitting: Internal Medicine

## 2009-09-21 ENCOUNTER — Ambulatory Visit: Payer: Self-pay | Admitting: Internal Medicine

## 2009-09-28 ENCOUNTER — Ambulatory Visit: Payer: Self-pay | Admitting: Internal Medicine

## 2009-10-01 ENCOUNTER — Ambulatory Visit: Payer: Self-pay | Admitting: Internal Medicine

## 2009-10-05 ENCOUNTER — Encounter
Admission: RE | Admit: 2009-10-05 | Discharge: 2009-10-17 | Payer: Self-pay | Admitting: Physical Medicine & Rehabilitation

## 2009-10-08 ENCOUNTER — Ambulatory Visit: Payer: Self-pay | Admitting: Physical Medicine & Rehabilitation

## 2009-10-10 ENCOUNTER — Ambulatory Visit: Payer: Self-pay | Admitting: Internal Medicine

## 2009-10-17 ENCOUNTER — Ambulatory Visit: Payer: Self-pay | Admitting: Internal Medicine

## 2009-11-02 ENCOUNTER — Ambulatory Visit: Payer: Self-pay | Admitting: Internal Medicine

## 2009-11-05 ENCOUNTER — Telehealth: Payer: Self-pay | Admitting: Internal Medicine

## 2009-11-07 ENCOUNTER — Telehealth: Payer: Self-pay | Admitting: Internal Medicine

## 2009-11-07 ENCOUNTER — Telehealth (INDEPENDENT_AMBULATORY_CARE_PROVIDER_SITE_OTHER): Payer: Self-pay | Admitting: *Deleted

## 2009-11-07 ENCOUNTER — Ambulatory Visit: Payer: Self-pay | Admitting: Internal Medicine

## 2009-11-08 ENCOUNTER — Encounter
Admission: RE | Admit: 2009-11-08 | Discharge: 2010-02-06 | Payer: Self-pay | Admitting: Physical Medicine & Rehabilitation

## 2009-11-11 ENCOUNTER — Encounter: Payer: Self-pay | Admitting: Internal Medicine

## 2009-11-14 ENCOUNTER — Ambulatory Visit: Payer: Self-pay | Admitting: Internal Medicine

## 2009-11-14 ENCOUNTER — Ambulatory Visit: Payer: Self-pay | Admitting: Physical Medicine & Rehabilitation

## 2009-11-15 ENCOUNTER — Encounter: Payer: Self-pay | Admitting: Internal Medicine

## 2009-11-15 ENCOUNTER — Ambulatory Visit: Payer: Self-pay | Admitting: Family Medicine

## 2009-11-23 ENCOUNTER — Ambulatory Visit: Payer: Self-pay | Admitting: Internal Medicine

## 2009-11-30 ENCOUNTER — Ambulatory Visit: Payer: Self-pay | Admitting: Internal Medicine

## 2009-12-07 ENCOUNTER — Ambulatory Visit: Payer: Self-pay | Admitting: Internal Medicine

## 2009-12-12 ENCOUNTER — Ambulatory Visit: Payer: Self-pay | Admitting: Physical Medicine & Rehabilitation

## 2009-12-12 ENCOUNTER — Ambulatory Visit: Payer: Self-pay | Admitting: Internal Medicine

## 2009-12-21 ENCOUNTER — Ambulatory Visit: Payer: Self-pay | Admitting: Internal Medicine

## 2009-12-28 ENCOUNTER — Ambulatory Visit: Payer: Self-pay | Admitting: Internal Medicine

## 2010-01-04 ENCOUNTER — Ambulatory Visit: Payer: Self-pay | Admitting: Physical Medicine & Rehabilitation

## 2010-01-04 ENCOUNTER — Ambulatory Visit: Payer: Self-pay | Admitting: Internal Medicine

## 2010-01-11 ENCOUNTER — Ambulatory Visit: Payer: Self-pay | Admitting: Internal Medicine

## 2010-01-18 ENCOUNTER — Telehealth (INDEPENDENT_AMBULATORY_CARE_PROVIDER_SITE_OTHER): Payer: Self-pay | Admitting: *Deleted

## 2010-01-18 ENCOUNTER — Ambulatory Visit: Payer: Self-pay | Admitting: Internal Medicine

## 2010-01-25 ENCOUNTER — Ambulatory Visit: Payer: Self-pay | Admitting: Internal Medicine

## 2010-02-01 ENCOUNTER — Ambulatory Visit: Payer: Self-pay | Admitting: Internal Medicine

## 2010-02-08 ENCOUNTER — Ambulatory Visit: Payer: Self-pay | Admitting: Internal Medicine

## 2010-02-13 ENCOUNTER — Encounter
Admission: RE | Admit: 2010-02-13 | Discharge: 2010-05-08 | Payer: Self-pay | Admitting: Physical Medicine & Rehabilitation

## 2010-02-13 ENCOUNTER — Ambulatory Visit: Payer: Self-pay | Admitting: Physical Medicine & Rehabilitation

## 2010-02-13 ENCOUNTER — Encounter
Admission: RE | Admit: 2010-02-13 | Discharge: 2010-02-22 | Payer: Self-pay | Admitting: Physical Medicine and Rehabilitation

## 2010-02-14 ENCOUNTER — Ambulatory Visit: Payer: Self-pay | Admitting: Internal Medicine

## 2010-02-18 ENCOUNTER — Ambulatory Visit: Payer: Self-pay | Admitting: Internal Medicine

## 2010-02-20 ENCOUNTER — Ambulatory Visit: Payer: Self-pay | Admitting: Family Medicine

## 2010-02-20 ENCOUNTER — Telehealth (INDEPENDENT_AMBULATORY_CARE_PROVIDER_SITE_OTHER): Payer: Self-pay | Admitting: *Deleted

## 2010-02-22 ENCOUNTER — Ambulatory Visit: Payer: Self-pay | Admitting: Internal Medicine

## 2010-03-01 ENCOUNTER — Ambulatory Visit: Payer: Self-pay | Admitting: Internal Medicine

## 2010-03-08 ENCOUNTER — Ambulatory Visit: Payer: Self-pay | Admitting: Internal Medicine

## 2010-03-08 ENCOUNTER — Ambulatory Visit: Payer: Self-pay | Admitting: Physical Medicine & Rehabilitation

## 2010-03-26 ENCOUNTER — Ambulatory Visit: Payer: Self-pay | Admitting: Internal Medicine

## 2010-04-05 ENCOUNTER — Ambulatory Visit: Payer: Self-pay | Admitting: Internal Medicine

## 2010-04-10 ENCOUNTER — Ambulatory Visit: Payer: Self-pay | Admitting: Physical Medicine & Rehabilitation

## 2010-04-10 ENCOUNTER — Ambulatory Visit: Payer: Self-pay | Admitting: Internal Medicine

## 2010-04-19 ENCOUNTER — Ambulatory Visit: Payer: Self-pay | Admitting: Internal Medicine

## 2010-04-22 ENCOUNTER — Ambulatory Visit: Payer: Self-pay | Admitting: Family Medicine

## 2010-04-26 ENCOUNTER — Ambulatory Visit: Payer: Self-pay | Admitting: Internal Medicine

## 2010-05-08 ENCOUNTER — Encounter
Admission: RE | Admit: 2010-05-08 | Discharge: 2010-08-06 | Payer: Self-pay | Admitting: Physical Medicine & Rehabilitation

## 2010-05-09 ENCOUNTER — Ambulatory Visit: Payer: Self-pay | Admitting: Internal Medicine

## 2010-05-15 ENCOUNTER — Ambulatory Visit: Payer: Self-pay | Admitting: Physical Medicine & Rehabilitation

## 2010-05-15 ENCOUNTER — Ambulatory Visit: Payer: Self-pay | Admitting: Internal Medicine

## 2010-05-22 ENCOUNTER — Ambulatory Visit: Payer: Self-pay | Admitting: Family Medicine

## 2010-05-24 ENCOUNTER — Ambulatory Visit: Payer: Self-pay | Admitting: Internal Medicine

## 2010-05-30 ENCOUNTER — Ambulatory Visit: Payer: Self-pay | Admitting: Internal Medicine

## 2010-06-07 ENCOUNTER — Ambulatory Visit: Payer: Self-pay | Admitting: Internal Medicine

## 2010-06-14 ENCOUNTER — Ambulatory Visit: Payer: Self-pay | Admitting: Internal Medicine

## 2010-06-14 ENCOUNTER — Ambulatory Visit: Payer: Self-pay | Admitting: Physical Medicine & Rehabilitation

## 2010-06-21 ENCOUNTER — Ambulatory Visit: Payer: Self-pay | Admitting: Internal Medicine

## 2010-06-24 ENCOUNTER — Ambulatory Visit: Payer: Self-pay | Admitting: Internal Medicine

## 2010-07-04 ENCOUNTER — Ambulatory Visit: Payer: Self-pay | Admitting: Internal Medicine

## 2010-07-11 ENCOUNTER — Ambulatory Visit: Payer: Self-pay | Admitting: Internal Medicine

## 2010-07-17 ENCOUNTER — Ambulatory Visit: Payer: Self-pay | Admitting: Internal Medicine

## 2010-07-17 ENCOUNTER — Ambulatory Visit: Payer: Self-pay | Admitting: Physical Medicine & Rehabilitation

## 2010-08-02 ENCOUNTER — Ambulatory Visit: Payer: Self-pay | Admitting: Internal Medicine

## 2010-08-07 ENCOUNTER — Encounter
Admission: RE | Admit: 2010-08-07 | Discharge: 2010-11-05 | Payer: Self-pay | Source: Home / Self Care | Attending: Physical Medicine & Rehabilitation | Admitting: Physical Medicine & Rehabilitation

## 2010-08-09 ENCOUNTER — Ambulatory Visit: Payer: Self-pay | Admitting: Internal Medicine

## 2010-08-14 ENCOUNTER — Ambulatory Visit: Payer: Self-pay | Admitting: Physical Medicine & Rehabilitation

## 2010-08-16 ENCOUNTER — Ambulatory Visit: Payer: Self-pay | Admitting: Internal Medicine

## 2010-08-22 ENCOUNTER — Ambulatory Visit: Payer: Self-pay | Admitting: Internal Medicine

## 2010-08-22 ENCOUNTER — Telehealth: Payer: Self-pay | Admitting: Internal Medicine

## 2010-08-29 ENCOUNTER — Ambulatory Visit: Payer: Self-pay | Admitting: Internal Medicine

## 2010-09-06 ENCOUNTER — Ambulatory Visit: Payer: Self-pay | Admitting: Internal Medicine

## 2010-09-13 ENCOUNTER — Ambulatory Visit: Payer: Self-pay | Admitting: Internal Medicine

## 2010-09-13 ENCOUNTER — Ambulatory Visit: Payer: Self-pay | Admitting: Physical Medicine & Rehabilitation

## 2010-09-18 ENCOUNTER — Ambulatory Visit: Payer: Self-pay | Admitting: Internal Medicine

## 2010-09-26 ENCOUNTER — Ambulatory Visit: Payer: Self-pay | Admitting: Internal Medicine

## 2010-10-04 ENCOUNTER — Ambulatory Visit: Payer: Self-pay | Admitting: Internal Medicine

## 2010-10-09 ENCOUNTER — Telehealth (INDEPENDENT_AMBULATORY_CARE_PROVIDER_SITE_OTHER): Payer: Self-pay | Admitting: *Deleted

## 2010-10-11 ENCOUNTER — Ambulatory Visit: Payer: Self-pay | Admitting: Internal Medicine

## 2010-10-16 ENCOUNTER — Ambulatory Visit: Payer: Self-pay | Admitting: Physical Medicine & Rehabilitation

## 2010-10-22 ENCOUNTER — Ambulatory Visit: Payer: Self-pay | Admitting: Internal Medicine

## 2010-10-29 ENCOUNTER — Ambulatory Visit: Payer: Self-pay | Admitting: Internal Medicine

## 2010-11-09 ENCOUNTER — Ambulatory Visit: Payer: Self-pay | Admitting: Internal Medicine

## 2010-11-11 ENCOUNTER — Encounter
Admission: RE | Admit: 2010-11-11 | Discharge: 2010-11-26 | Payer: Self-pay | Source: Home / Self Care | Attending: Physical Medicine & Rehabilitation | Admitting: Physical Medicine & Rehabilitation

## 2010-11-13 ENCOUNTER — Ambulatory Visit: Payer: Self-pay | Admitting: Internal Medicine

## 2010-11-15 ENCOUNTER — Ambulatory Visit: Payer: Self-pay | Admitting: Internal Medicine

## 2010-11-17 ENCOUNTER — Encounter: Payer: Self-pay | Admitting: Orthopaedic Surgery

## 2010-11-21 ENCOUNTER — Ambulatory Visit: Payer: Self-pay | Admitting: Internal Medicine

## 2010-11-25 ENCOUNTER — Ambulatory Visit: Payer: Self-pay | Admitting: Internal Medicine
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
## 2010-11-26 NOTE — Assessment & Plan Note (Signed)
Summary: H1N1  Flu Vaccine Consent Questions     Do you have a history of severe allergic reactions to this vaccine? no    Any prior history of allergic reactions to egg and/or gelatin? no    Do you have a sensitivity to the preservative Thimersol? no    Do you have a past history of Guillan-Barre Syndrome? no    Do you currently have an acute febrile illness? no    Have you ever had a severe reaction to latex? no    Vaccine information given and explained to patient? yes    Are you currently pregnant? no   Do you have Asthma? no   Lot Number: [id number]   Exp Date:04/25/2009   Site Given Left Deltoid  Kristopher Gay CMA  October 06, 2008 12:22 PM               Current Allergies: No known allergies           ]

## 2010-11-26 NOTE — Miscellaneous (Signed)
Summary: Injection [employer] risk [employer]   Imported By: Sherian Rein 09/18/2010 14:07:25  _____________________________________________________________________  External Attachment:    Type:   Image     Comment:   External Document
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
## 2010-11-26 NOTE — Miscellaneous (Signed)
Summary: Rx for Allergy  Rx for Allergy   Imported By: Esmeralda Links D'jimraou 02/21/2008 14:13:42  _____________________________________________________________________  External Attachment:    Type:   Image     Comment:   External Document

## 2010-11-26 NOTE — Miscellaneous (Signed)
Summary: Injection Orders / [facility]    Injection Orders / [facility]    Imported By: Lennie Odor 03/26/2010 15:52:33  _____________________________________________________________________  External Attachment:    Type:   Image     Comment:   External Document

## 2010-11-26 NOTE — Medication Information (Signed)
Summary: Prior [facility] for Fexofenadine/[facility]  Prior [facility] for Fexofenadine/[facility]   Imported By: Sherian Rein 11/14/2009 12:04:56  _____________________________________________________________________  External Attachment:    Type:   Image     Comment:   External Document

## 2010-11-26 NOTE — Miscellaneous (Signed)
Summary: Routine Allergy Skin Test/[facility]  Routine Allergy Skin Test/[facility]   Imported By: Esmeralda Links D'jimraou 02/21/2008 14:14:20  _____________________________________________________________________  External Attachment:    Type:   Image     Comment:   External Document

## 2010-11-26 NOTE — Miscellaneous (Signed)
Summary: Injection Record / [facility]    Injection Record / [facility]    Imported By: Lennie Odor 07/17/2010 13:49:07  _____________________________________________________________________  External Attachment:    Type:   Image     Comment:   External Document
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
## 2010-11-26 NOTE — Progress Notes (Signed)
Summary: office notes  Phone Note From Other Clinic Call back at [phone]   Caller: Dr. Jola Babinski Office Call For: Kristopher Gay Summary of Call: Need office note from 02/14/2010 faxed to them asap - pt there.  [phone] Attn:  Anna Initial call taken by: Eugene Gavia,  February 20, 2010 11:16 AM  Follow-up for Phone Call        Faxed notes.//Juanita Follow-up by: Darletta Moll,  February 21, 2010 11:35 AM

## 2010-11-26 NOTE — Miscellaneous (Signed)
Summary: Injection Record/[facility]  Injection Record/[facility]   Imported By: Sherian Rein 03/19/2010 11:25:55  _____________________________________________________________________  External Attachment:    Type:   Image     Comment:   External Document

## 2010-11-26 NOTE — Progress Notes (Signed)
Summary: prescript  Phone Note Call from Patient   Caller: Patient Call For: young Summary of Call:  need refill for proair benzonatate 100mg  allegra 180 [facility] [phone] Initial call taken by: Rickard Patience,  November 05, 2009 3:15 PM    Prescriptions: ALLEGRA 180 MG TABS (FEXOFENADINE HCL) Take 1 tablet by mouth once a day  #30 x 2   Entered by:   Reynaldo Minium CMA   Authorized by:   Waymon Budge MD   Signed by:   Reynaldo Minium CMA on 11/05/2009   Method used:   Electronically to        [facility] [phone]* (retail)       [address]       [place], [place]  [address]       Ph: [phone]       Fax: [phone]   RxID:   [id number] BENZONATATE 100 MG CAPS (BENZONATATE) 1 or 2 four times a day as needed cough  #30 x 2   Entered by:   Reynaldo Minium CMA   Authorized by:   Waymon Budge MD   Signed by:   Reynaldo Minium CMA on 11/05/2009   Method used:   Electronically to        [facility] [phone]* (retail)       [address]       [place], [place]  [address]       Ph: [phone]       Fax: [phone]   RxID:   [id number] PROAIR HFA 108 (90 BASE) MCG/ACT AERS (ALBUTEROL SULFATE) Inhale 2 puffs up to four times daily as needed.  #1 inhaler x 2   Entered by:   Reynaldo Minium CMA   Authorized by:   Waymon Budge MD   Signed by:   Reynaldo Minium CMA on 11/05/2009   Method used:   Electronically to        [facility] [phone]* (retail)       [address]       [place], [place]  [address]       Ph: [phone]       Fax: [phone]   RxID:   [id number]

## 2010-11-26 NOTE — Miscellaneous (Signed)
Summary: Injection Record/[facility]  Injection Record/[facility]   Imported By: Sherian Rein 03/19/2010 08:38:02  _____________________________________________________________________  External Attachment:    Type:   Image     Comment:   External Document

## 2010-11-26 NOTE — Miscellaneous (Signed)
Summary: Injection record/[facility]  Injection record/[facility]   Imported By: Lester Rosebud 05/22/2010 09:30:53  _____________________________________________________________________  External Attachment:    Type:   Image     Comment:   External Document

## 2010-11-26 NOTE — Miscellaneous (Signed)
Summary: Orders Update pft charges  Clinical Lists Changes  Orders: Added new Service order of Carbon Monoxide diffusing w/capacity (94720) - Signed Added new Service order of Lung Volumes (94240) - Signed Added new Service order of Spirometry (Pre & Post) (94060) - Signed 
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
## 2010-11-26 NOTE — Progress Notes (Signed)
Summary: Records request from [employer]  Request for records received from [employer]. Request forwarded to [employer]. Wilder Glade  January 18, 2010 4:27 PM
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
## 2010-11-26 NOTE — Progress Notes (Signed)
Summary: Cal from patient requesting copies of his records  patient called stating that [facility] only received part of the records requested. Pam spoke with patient and advised that 25 pages were faxed to their office. Patient wanted to stop by and get a copy of the records that were faxed. Pam advised patient he would need to complete a Medical Release form.Dena Chavis  November 07, 2009 11:20 AM

## 2010-11-26 NOTE — Medication Information (Signed)
Summary: Tax adviser   Imported By: Valinda Hoar 11/15/2009 08:03:40  _____________________________________________________________________  External Attachment:    Type:   Image     Comment:   External Document
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
## 2010-11-26 NOTE — Progress Notes (Signed)
Summary: RX questions-lmtcbx2  Phone Note Other Incoming   Caller: Pt.came in for all.shot. Details for Reason: Ins. won't cover allegra 180. Summary of Call: Kristopher Gay came in for his allergy shot  today,wanting to know if Dr.Jaileigh Weimer can send his ins.co. a letter that allegra 180 works better; if he hasn't  already done so. If so please recommend an OTC antihistimine he can take. Pt's cb# [phone]. Initial call taken by: Kristopher Gay,  November 07, 2009 12:54 PM  Follow-up for Phone Call        Please let pt know we are working on PA and it can take up to 2 weeks to get an answer from his insurance company.Reynaldo Minium CMA  November 07, 2009 1:29 PM   [facility] to Advise pt he could try Zyrtec or Claritin OTC while he was waiting for PA to be approved. Kristopher Gay CMA  November 07, 2009 3:32 PM  LMTCBx2 . Kristopher Gay CMA  November 09, 2009 3:20 PM     Additional Follow-up for Phone Call Additional follow up Details #1::        pt advised.Kristopher Gay CMA  November 12, 2009 10:39 AM

## 2010-11-26 NOTE — Progress Notes (Signed)
Summary: refill on patanase  Phone Note Call from Patient Call back at Home Phone [phone]   Caller: Patient Call For: young Summary of Call: pt wants rx for patanase- this is working well so far per pt. [facility] [address]  Initial call taken by: Tivis Ringer, CNA,  August 22, 2010 3:10 PM  Follow-up for Phone Call        [facility] to give RX to patient.Reynaldo Minium CMA  August 23, 2010 9:42 AM  rx sent. pt aware. Carron Curie CMA  August 23, 2010 9:46 AM     New/Updated Medications: PATANASE 0.6 % SOLN (OLOPATADINE HCL) 1-2 puffs each nostril up to twice daily as needed Prescriptions: PATANASE 0.6 % SOLN (OLOPATADINE HCL) 1-2 puffs each nostril up to twice daily as needed  #1 x 3   Entered by:   Carron Curie CMA   Authorized by:   Waymon Budge MD   Signed by:   Carron Curie CMA on 08/23/2010   Method used:   Electronically to        [facility]. [phone]* (retail)       [address]       [place], [place]  [address]       Ph: [phone]       Fax: [phone]   RxID:   [id number]

## 2010-11-28 NOTE — Progress Notes (Signed)
Summary: rx request   Phone Note Call from Patient Call back at Home Phone [phone]   Caller: Patient Call For: young Summary of Call: pt wants a new rx for advair 250/50. doesn't like the dulera. he was given a sample of dulera at last ov to try. [facility] [address] [facility].  Initial call taken by: Tivis Ringer, CNA,  October 09, 2010 11:57 AM  Follow-up for Phone Call        last ov note 10.21.11 states:  Problem # 1:  ASTHMA (ICD-493.90) i question if advair is irritating throat- dry powder. We also discuss potential for reflux. Will try sample Dulera 100-5  LMOM TCB x1. Boone Master CNA/MA  October 09, 2010 2:41 PM   Additional Follow-up for Phone Call Additional follow up Details #1::        Patient does not feel that Advair irritates his throat, and symptoms are worse on Dulera.  Patient is adamant that he wants prescription for Advair immediately.  Pharmacy is [facility], [facility] [place], [phone]. Additional Follow-up by: Leonette Monarch,  October 10, 2010 11:03 AM    Additional Follow-up for Phone Call Additional follow up Details #2::    Per CDY-okay to give Advair 250/50 1 puff two times a day and RINSE MOUTH WELL AFTER USE with as needed refills. D/C Dulera.Reynaldo Minium CMA  October 10, 2010 11:22 AM    Pt is aware that RX has been sent.Reynaldo Minium CMA  October 10, 2010 11:31 AM   New/Updated Medications: ADVAIR DISKUS 250-50 MCG/DOSE  MISC (FLUTICASONE-SALMETEROL) take 1 puff two times a day Prescriptions: ADVAIR DISKUS 250-50 MCG/DOSE  MISC (FLUTICASONE-SALMETEROL) take 1 puff two times a day  #1 x 11   Entered by:   Reynaldo Minium CMA   Authorized by:   Waymon Budge MD   Signed by:   Reynaldo Minium CMA on 10/10/2010   Method used:   Electronically to        [facility]. [phone]* (retail)       [address]       [place], [place]  [address]       Ph: [phone]       Fax:  [phone]   RxID:   [id number]

## 2010-11-29 DIAGNOSIS — J301 Allergic rhinitis due to pollen: Secondary | ICD-10-CM

## 2010-12-06 DIAGNOSIS — J301 Allergic rhinitis due to pollen: Secondary | ICD-10-CM

## 2010-12-10 ENCOUNTER — Encounter: Payer: Medicare Other | Attending: Physical Medicine & Rehabilitation

## 2010-12-10 ENCOUNTER — Ambulatory Visit: Payer: Medicare Other | Admitting: Physical Medicine & Rehabilitation

## 2010-12-10 DIAGNOSIS — IMO0002 Reserved for concepts with insufficient information to code with codable children: Secondary | ICD-10-CM | POA: Insufficient documentation

## 2010-12-10 DIAGNOSIS — G5730 Lesion of lateral popliteal nerve, unspecified lower limb: Secondary | ICD-10-CM

## 2010-12-10 DIAGNOSIS — M47817 Spondylosis without myelopathy or radiculopathy, lumbosacral region: Secondary | ICD-10-CM

## 2010-12-10 DIAGNOSIS — F341 Dysthymic disorder: Secondary | ICD-10-CM | POA: Insufficient documentation

## 2010-12-12 ENCOUNTER — Ambulatory Visit (INDEPENDENT_AMBULATORY_CARE_PROVIDER_SITE_OTHER): Payer: Medicare Other

## 2010-12-12 DIAGNOSIS — J301 Allergic rhinitis due to pollen: Secondary | ICD-10-CM

## 2010-12-19 ENCOUNTER — Ambulatory Visit (INDEPENDENT_AMBULATORY_CARE_PROVIDER_SITE_OTHER): Payer: Medicare Other

## 2010-12-19 ENCOUNTER — Encounter: Payer: Self-pay | Admitting: Internal Medicine

## 2010-12-19 DIAGNOSIS — J301 Allergic rhinitis due to pollen: Secondary | ICD-10-CM

## 2010-12-26 ENCOUNTER — Ambulatory Visit (INDEPENDENT_AMBULATORY_CARE_PROVIDER_SITE_OTHER): Payer: Medicare Other

## 2010-12-26 ENCOUNTER — Encounter (INDEPENDENT_AMBULATORY_CARE_PROVIDER_SITE_OTHER): Payer: Self-pay | Admitting: *Deleted

## 2010-12-26 DIAGNOSIS — J301 Allergic rhinitis due to pollen: Secondary | ICD-10-CM

## 2010-12-26 DIAGNOSIS — J302 Other seasonal allergic rhinitis: Secondary | ICD-10-CM | POA: Insufficient documentation

## 2010-12-26 DIAGNOSIS — J3089 Other allergic rhinitis: Secondary | ICD-10-CM

## 2011-01-02 NOTE — Assessment & Plan Note (Signed)
Summary: allergy/cb  Nurse Visit   Allergies: No Known Drug Allergies  Orders Added: 1)  Allergy Injection (1) [95115] 

## 2011-01-03 ENCOUNTER — Encounter: Payer: Self-pay | Admitting: Internal Medicine

## 2011-01-03 ENCOUNTER — Ambulatory Visit (INDEPENDENT_AMBULATORY_CARE_PROVIDER_SITE_OTHER): Payer: Medicare Other

## 2011-01-03 DIAGNOSIS — J301 Allergic rhinitis due to pollen: Secondary | ICD-10-CM

## 2011-01-07 NOTE — Assessment & Plan Note (Signed)
Summary: ALLERGY/CB   Nurse Visit   Allergies: No Known Drug Allergies  Orders Added: 1)  Allergy Injection (1) [95115] 

## 2011-01-09 ENCOUNTER — Encounter: Payer: Self-pay | Admitting: Internal Medicine

## 2011-01-09 ENCOUNTER — Ambulatory Visit (INDEPENDENT_AMBULATORY_CARE_PROVIDER_SITE_OTHER): Payer: Medicare Other

## 2011-01-09 DIAGNOSIS — J301 Allergic rhinitis due to pollen: Secondary | ICD-10-CM

## 2011-01-14 NOTE — Assessment & Plan Note (Signed)
Summary: allergy/cb  Nurse Visit   Allergies: No Known Drug Allergies  Orders Added: 1)  Allergy Injection (1) [95115] 

## 2011-01-14 NOTE — Miscellaneous (Signed)
Summary: Injection [employer] account [employer]   Imported By: Sherian Rein 01/06/2011 14:34:57  _____________________________________________________________________  External Attachment:    Type:   Image     Comment:   External Document

## 2011-01-15 ENCOUNTER — Ambulatory Visit (INDEPENDENT_AMBULATORY_CARE_PROVIDER_SITE_OTHER): Payer: Medicare Other

## 2011-01-15 ENCOUNTER — Encounter: Payer: Medicare Other | Attending: Physical Medicine & Rehabilitation

## 2011-01-15 ENCOUNTER — Ambulatory Visit: Payer: Medicare Other

## 2011-01-15 DIAGNOSIS — G5730 Lesion of lateral popliteal nerve, unspecified lower limb: Secondary | ICD-10-CM | POA: Insufficient documentation

## 2011-01-15 DIAGNOSIS — F341 Dysthymic disorder: Secondary | ICD-10-CM | POA: Insufficient documentation

## 2011-01-15 DIAGNOSIS — M47817 Spondylosis without myelopathy or radiculopathy, lumbosacral region: Secondary | ICD-10-CM

## 2011-01-15 DIAGNOSIS — J301 Allergic rhinitis due to pollen: Secondary | ICD-10-CM

## 2011-01-15 DIAGNOSIS — IMO0002 Reserved for concepts with insufficient information to code with codable children: Secondary | ICD-10-CM

## 2011-01-15 DIAGNOSIS — M5137 Other intervertebral disc degeneration, lumbosacral region: Secondary | ICD-10-CM

## 2011-01-24 ENCOUNTER — Ambulatory Visit (INDEPENDENT_AMBULATORY_CARE_PROVIDER_SITE_OTHER): Payer: Medicare Other

## 2011-01-24 DIAGNOSIS — J301 Allergic rhinitis due to pollen: Secondary | ICD-10-CM

## 2011-01-30 ENCOUNTER — Ambulatory Visit (INDEPENDENT_AMBULATORY_CARE_PROVIDER_SITE_OTHER): Payer: Medicare Other

## 2011-01-30 DIAGNOSIS — J301 Allergic rhinitis due to pollen: Secondary | ICD-10-CM

## 2011-02-07 ENCOUNTER — Ambulatory Visit (INDEPENDENT_AMBULATORY_CARE_PROVIDER_SITE_OTHER): Payer: Medicare Other

## 2011-02-07 DIAGNOSIS — J309 Allergic rhinitis, unspecified: Secondary | ICD-10-CM

## 2011-02-12 ENCOUNTER — Encounter: Payer: Medicare Other | Attending: Neurosurgery | Admitting: Neurosurgery

## 2011-02-12 ENCOUNTER — Ambulatory Visit (INDEPENDENT_AMBULATORY_CARE_PROVIDER_SITE_OTHER): Payer: Medicare Other

## 2011-02-12 DIAGNOSIS — J309 Allergic rhinitis, unspecified: Secondary | ICD-10-CM

## 2011-02-12 DIAGNOSIS — G540 Brachial plexus disorders: Secondary | ICD-10-CM

## 2011-02-12 DIAGNOSIS — M216X9 Other acquired deformities of unspecified foot: Secondary | ICD-10-CM | POA: Insufficient documentation

## 2011-02-12 DIAGNOSIS — IMO0002 Reserved for concepts with insufficient information to code with codable children: Secondary | ICD-10-CM | POA: Insufficient documentation

## 2011-02-12 DIAGNOSIS — M47817 Spondylosis without myelopathy or radiculopathy, lumbosacral region: Secondary | ICD-10-CM

## 2011-02-12 DIAGNOSIS — G609 Hereditary and idiopathic neuropathy, unspecified: Secondary | ICD-10-CM | POA: Insufficient documentation

## 2011-02-13 NOTE — Assessment & Plan Note (Signed)
HISTORY:  This is a patient seen by Dr. Wynn Banker for sometime for an old work injury that has resolved in a chronic right L3 radiculitis.  He also has peroneal neuropathy, this was diagnosed by Dr. Thad Ranger of [facility].  He has no new problems today.  He has AFO for mild foot drop on the left side, nothing on the right.  REVIEW OF SYSTEMS:  Notable for average pain level to about 7.  He does have shoulder and arm pain as well as leg pain.  This problem is neuropathic in nature.  He has pain with walking, bending, standing, and most activities.  Pacing in therapy, rest, TENS unit, and medication tend to help.  Mobility, he uses a cane.  He does have AFO on the left. Functionality, he is disabled, has been since 2008.  Neuro/psych, he does report several things like numbness, tremors, tingling, trouble walking, spasms, dizziness, confusion, depression, anxiety, and suicidal thoughts, although he has not got a plan to commit suicide; otherwise, constitutionally has fever and chills, night sweats, nausea, constipation, abdominal pain, poor appetite, respiratory infection, some coughing, and wheezing.  PAST MEDICAL HISTORY:  Unchanged since last visit.  SOCIAL HISTORY:  He is single.  FAMILY HISTORY:  Unchanged.  PHYSICAL EXAMINATION:  VITAL SIGNS:  Blood pressure to be 148/84, pulse 71, respirations 20, O2 sats 97 on room air. NEUROLOGIC:  He seems to have 5/5 with dorsiflexion, plantar flexion on the right.  He does not want to check his iliopsoas, quadriceps due to his knee, some kind of injury he has there.  Left lower extremity not examined per his request.  Sensation appears to be intact.  We will go ahead and refill his Neurontin and Kadian today, Kadian is 30 mg one p.o. daily, given 30 with no refill and Neurontin is 300 mg two in the morning, one at lunch, two at night, 150 with 3 refills.  He will follow up with nurse in 1 month.  He is in agreement with this  plan. Questions were encouraged and answered.     Kadien Lineman L. Blima Dessert    RLW/MedQ D:  02/12/2011 14:01:27  T:  02/13/2011 02:03:44  Job #:  [id number]

## 2011-02-17 ENCOUNTER — Encounter: Payer: Self-pay | Admitting: Internal Medicine

## 2011-02-17 ENCOUNTER — Ambulatory Visit (INDEPENDENT_AMBULATORY_CARE_PROVIDER_SITE_OTHER): Payer: Medicare Other | Admitting: Internal Medicine

## 2011-02-17 VITALS — BP 132/80 | HR 77 | Ht 75.0 in | Wt 249.0 lb

## 2011-02-17 DIAGNOSIS — J301 Allergic rhinitis due to pollen: Secondary | ICD-10-CM

## 2011-02-17 DIAGNOSIS — J45909 Unspecified asthma, uncomplicated: Secondary | ICD-10-CM

## 2011-02-17 MED ORDER — FLUTICASONE PROPIONATE 50 MCG/ACT NA SUSP: 2.0000 | Freq: Every day | NASAL | Status: DC

## 2011-02-17 NOTE — Patient Instructions (Addendum)
Consider generic Allegra 180/ fexofenadine 180  Script sent for nasal steroid spray Flonase/ fluticasone  Consider trial of otc anti-inflammatory nasal spray  Nasalcrom/ cromolyn- follow directions on the box.

## 2011-02-17 NOTE — Progress Notes (Signed)
  Subjective:    Patient ID: Kristopher Gay, male    DOB: 1951/09/01, 60 y.o.   MRN: [id number]  HPI 60 yo never smoker followed for allergic rhinitis and allergic asthma, complicated by chronic musculoskeletal pain after an injury. Last here August 16, 2010. That note reviewed. He got through the winter ok, still limited by neurologic complaints. He reports a change in cough, deeper with some tightness over last 4 months. No change in perennial sinus drainage and occasional hoarseness. He continues allergy vaccine here at 1:10. 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 He continues Advair 250, reporting no difference with Advair 500 or with Dulera. Nasal sprays seem to work.  Needs refill tessalon, epipen and flonase.   Review of Systems See HPI Constitutional:   No weight loss, night sweats,  Fevers, chills, fatigue, lassitude. HEENT:Neck pains,  Difficulty swallowing,  Tooth/dental problems,  Sore throat,  CV:  No chest pain,  Orthopnea, PND, swelling in lower extremities, anasarca, dizziness, palpitations  GI  No heartburn, indigestion, abdominal pain, nausea, vomiting, diarrhea, change in bowel habits, loss of appetite  Resp: No shortness of breath with exertion or at rest.  No excess mucus,   No coughing up of blood.  No change in color of mucus. .  No chest wall deformity  Skin: no rash or lesions.  GU: no dysuria, change in color of urine, no urgency or frequency.  No flank pain.  MS:  No joint pain or swelling.  No decreased range of motion.  No back pain.  Psych:  No change in mood or affect. No depression or anxiety.  No memory loss.      Objective:   Physical Exam General- Alert, Oriented, Affect-appropriate, Distress- none acute  Skin- rash-none, lesions- none, excoriation- none  Lymphadenopathy- none  Head- atraumatic  Eyes- Gross vision intact, PERRLA, conjunctivae clear, secretions  Ears- Normal- Hearing, canals, Tm L ,   R ,  Nose-  Clear, No-  Septal dev, mucus, polyps, erosion, perforation   Throat- Mallampati II , mucosa clear , drainage- none, tonsils- atrophic  Neck- flexible , trachea midline, no stridor , thyroid nl, carotid no bruit  Chest - symmetrical excursion , unlabored     Heart/CV- RRR , no murmur , no gallop  , no rub, nl s1 s2                     - JVD- none , edema- none, stasis changes- none, varices- none     Lung- clear to P&A, wheeze- none, cough- none , dullness-none, rub- none     Chest wall- Abd- tender-no, distended-no, bowel sounds-present, HSM- no  Br/ Gen/ Rectal- Not done, not indicated  Extrem- cyanosis- none, clubbing, none, atrophy- none, strength- nl. Brace left lower leg.   Neuro- grossly intact to observation         Assessment & Plan:

## 2011-02-17 NOTE — Assessment & Plan Note (Signed)
Controlled on Advair 250 and his allergy regimen.

## 2011-02-17 NOTE — Assessment & Plan Note (Addendum)
He continues allergy vaccine and it has seemed to help in the past. It may be time soon to stop and see how he does off vaccine. I offered that today and he was hesitant in pollen season.Marland Kitchen He is somatically focused, and notes more drainage on certain days in pollen season. We are refilling the fluticasone nasal spray,leaving off Patanase for now. Educated about generics and Armed forces logistics/support/administrative officer.

## 2011-02-21 ENCOUNTER — Ambulatory Visit (INDEPENDENT_AMBULATORY_CARE_PROVIDER_SITE_OTHER): Payer: Medicare Other

## 2011-02-21 DIAGNOSIS — J309 Allergic rhinitis, unspecified: Secondary | ICD-10-CM

## 2011-02-28 ENCOUNTER — Ambulatory Visit (INDEPENDENT_AMBULATORY_CARE_PROVIDER_SITE_OTHER): Payer: Medicare Other

## 2011-02-28 DIAGNOSIS — J309 Allergic rhinitis, unspecified: Secondary | ICD-10-CM

## 2011-03-05 ENCOUNTER — Ambulatory Visit (INDEPENDENT_AMBULATORY_CARE_PROVIDER_SITE_OTHER): Payer: Medicare Other

## 2011-03-05 ENCOUNTER — Other Ambulatory Visit (INDEPENDENT_AMBULATORY_CARE_PROVIDER_SITE_OTHER): Payer: Medicare Other | Admitting: Family Medicine

## 2011-03-05 DIAGNOSIS — J309 Allergic rhinitis, unspecified: Secondary | ICD-10-CM

## 2011-03-05 DIAGNOSIS — Z79899 Other long term (current) drug therapy: Secondary | ICD-10-CM

## 2011-03-05 MED ORDER — LISINOPRIL-HYDROCHLOROTHIAZIDE 10-12.5 MG PO TABS: 1.0000 | ORAL_TABLET | Freq: Every day | ORAL | Status: DC
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
## 2011-03-11 NOTE — Assessment & Plan Note (Signed)
A 60 year old male who had a Worker's Comp injury with back pain, neck  pain, thoracic pain, multilevel spondylosis, cervical spine was noted,  lumbar spondylosis with some left L3 nerve root encroachment was seen on  MRI.  He has been complaining of bilateral lower extremity pain and has  noted to have a foot drop on my last examination.  We did do an EMG and  CV on January 08, 2009, which did reveal a left peroneal neuropathy at the  fibular head with both nerve conduction and EMG abnormalities.   He has had no change in his complaints at the current time.  His  Oswestry score remains 70, although his functional status by interview  appears to be better.   He is working 12 hours a week.   PHYSICAL EXAMINATION:  EXTREMITIES:  He has left foot drop noted with  ambulation particularly with heel walk.  GENERAL:  No acute distress.  Mood and affect appropriate.  NEUROLOGIC:  Sensation, mildly reduced to left dorsum of the foot for  the right side.   IMPRESSION:  1. Left peroneal neuropathy.  2. Has lumbosacral spondylosis with right L3 chronic radiculitis.  I      believe his main complaint is on the right side.   PLAN:  We will continue his fairly low-dose narcotic analgesic regimen  of Kadian 20 mg per day.  In addition, we will continue the Neurontin  300 mg 5 times per day.  We discussed other treatment options including  bracing as well as surgical referral for his peroneal neuropathy.  He  would like to try the bracing first.   I will see him back in followup in about another month.      Erick Colace, M.D.  Electronically Signed     AEK/MedQ  D:  02/12/2009 16:48:07  T:  02/13/2009 06:01:45  Job #:  [id number]

## 2011-03-11 NOTE — Assessment & Plan Note (Signed)
Mr. Bardon is a 60 year old male who had a workers' comp injury with a  back pain,  neck pain, thoracic pain, multilevel spondylosis cervical  spine was noted, lumbar spondylosis causing some left L3 nerve root  encroachment was seen on the MRI as well.  No changes further down in  the lumbar area.  He has been pretty well maintained on Kadian 20 mg per  day.  He has had appropriate pill counts.  He has had appropriate urine  drug screen.  He has had no signs of aberrant drug behavior.   PAST MEDICAL HISTORY:  Negative diabetes.  Negative thyroid disease.  Negative carpal tunnel syndrome.   EXAMINATION:  GENERAL:  No acute distress.  Mood and affect are  appropriate.  Gait has a mild left foot drop.  Average pain is 6/10.  Multifocal shoulders, hands, left and right lower extremities, his right  is more proximal thigh.   IMPRESSION:  1. Lumbosacral spondylosis with right L3 chronic radiculitis.  2. Left foot drop not explained by MRI findings involve in his      peroneal neuropathy.  3. Narcotic analgesic medications for chronic pain, on modest dose.      There is no signs of aberrant drug behavior using Neurontin for      neuropathic pain with increased dose being tolerated well.  We have      discussed medications.  We discussed further workup and also      injections at this point, he is not inclined to try any type of      injections, although I do think he would benefit from medial branch      blocks as well as right L3 transforaminal injection.   We will see him back in another month, monitor his compliance and his  treatment effect.      Erick Colace, M.D.  Electronically Signed     AEK/MedQ  D:  01/08/2009 10:25:33  T:  01/09/2009 00:49:30  Job #:  [id number]   cc:   Sharlot Gowda, M.D.  Fax: [phone]

## 2011-03-11 NOTE — Assessment & Plan Note (Signed)
HISTORY:  A 60 year old male who had a workers' comp injury in 2007,  multiple injuries, had back pain, thoracic pain, and some lumbar  spondylosis.  He has also had a footdrop and an EMG showed left peroneal  neuropathy.  He just received a AFO, but has not picked it up yet.  He  has been well controlled on his low-dose narcotic regimen of Kadian 20  mg per day as well as the Neurontin 300 five times per day for the  neuropathic pain.  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   REVIEW OF SYSTEMS:  Positive for numbness and tingling primarily in the  left lower extremity, but somewhat on the right lower extremity, trouble  walking, spasms, confusion, depression, anxiety, coughing, and shortness  of breath.  He does follow up with primary physician on his general  medical problems, Dr. Sharlot Gowda.   SOCIAL HISTORY:  Lives alone, single, nonsmoker, nondrinker.   FAMILY HISTORY:  High blood pressure and epilepsy.   PHYSICAL EXAMINATION:  VITAL SIGNS:  Blood pressure 150/75, pulse 77,  respirations 18, and O2 sat 97% on room air.  GENERAL:  No acute stress.  Orientation x3.  Affect alert.  Gait is with  a cane.  He has a steppage-type gait on left side.  His ankle  dorsiflexion is 3/5.  His sensation reduced in the dorsum of foot, but  intact on the little toe.   IMPRESSION:  1. Left peroneal neuropathy.  2. With footdrop and gait deviation.  3. Lumbosacral spondylosis without myelopathy.  Once again, I      discussed the option of injections which he does not want to try.   I will see him back in 3 months' and nursing visit in 1 month.      Erick Colace, M.D.  Electronically Signed     AEK/MedQ  D:  03/15/2009 09:09:06  T:  03/15/2009 21:47:59  Job #:  [id number]
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
## 2011-03-12 ENCOUNTER — Encounter: Payer: Medicare Other | Attending: Physical Medicine & Rehabilitation

## 2011-03-12 ENCOUNTER — Ambulatory Visit (INDEPENDENT_AMBULATORY_CARE_PROVIDER_SITE_OTHER): Payer: Medicare Other

## 2011-03-12 DIAGNOSIS — G5730 Lesion of lateral popliteal nerve, unspecified lower limb: Secondary | ICD-10-CM

## 2011-03-12 DIAGNOSIS — R209 Unspecified disturbances of skin sensation: Secondary | ICD-10-CM | POA: Insufficient documentation

## 2011-03-12 DIAGNOSIS — R259 Unspecified abnormal involuntary movements: Secondary | ICD-10-CM | POA: Insufficient documentation

## 2011-03-12 DIAGNOSIS — IMO0002 Reserved for concepts with insufficient information to code with codable children: Secondary | ICD-10-CM | POA: Insufficient documentation

## 2011-03-12 DIAGNOSIS — M62838 Other muscle spasm: Secondary | ICD-10-CM | POA: Insufficient documentation

## 2011-03-12 DIAGNOSIS — M25519 Pain in unspecified shoulder: Secondary | ICD-10-CM | POA: Insufficient documentation

## 2011-03-12 DIAGNOSIS — M79609 Pain in unspecified limb: Secondary | ICD-10-CM | POA: Insufficient documentation

## 2011-03-12 DIAGNOSIS — M5137 Other intervertebral disc degeneration, lumbosacral region: Secondary | ICD-10-CM

## 2011-03-12 DIAGNOSIS — M47817 Spondylosis without myelopathy or radiculopathy, lumbosacral region: Secondary | ICD-10-CM

## 2011-03-12 DIAGNOSIS — F341 Dysthymic disorder: Secondary | ICD-10-CM | POA: Insufficient documentation

## 2011-03-12 DIAGNOSIS — J309 Allergic rhinitis, unspecified: Secondary | ICD-10-CM

## 2011-03-14 NOTE — Assessment & Plan Note (Signed)
[facility]                             PULMONARY OFFICE NOTE   NAME:Kristopher Gay, Kristopher Gay                         MRN:          [id number]  DATE:12/01/2006                            DOB:          09-Nov-1950    PROBLEM:  1. Allergic asthma.  2. Allergic rhinitis.   HISTORY:  He continues vaccine at 1/10 and we talked today about  duration of vaccine therapy, goals, and management with the decision  that we would bring him back for retesting.  His original testing was in  2004.  He is getting over a mild cold which triggered increased wheezing  and thinks that overall through this fall and winter he has been  gradually a little bit more congested.  He has not recognized reflux and  did increase his Prilosec as directed.  He also retried Singulair with  no benefit.  He is very sensitive to nonspecific odors and smokes.  He  has never gotten a flu vaccine.  Spiriva had not helped.   MEDICATIONS:  1. Allergy vaccine.  2. Advair 250/50.  3. Vitamins.  4. Albuterol.  5. P.r.n. Allegra.   ALLERGIES:  No medication allergy.   OBJECTIVE:  VITAL SIGNS:  Weight 208 pounds, blood pressure 114/76,  pulse regular and 67, room air saturation 95%.  LUNGS:  There is mild inspiratory and expiratory wheezing.  GENERAL:  This is a muscular, large man in no distress.  NECK;  There is no neck vein distention or stridor.  No adenopathy.  HEENT:  No nasal congestion.  HEART:  Heart sounds are regular without murmur.   IMPRESSION:  1. Allergic rhinitis.  2. Mild exacerbation of allergic asthma, probably with a viral      component currently.   PLAN:  He is going to try Symbicort 160/4.5 two puffs b.i.d., replacing  Advair for comparison, and will return as able for allergy retesting.     [name]. Maple Hudson, MD, Tonny Bollman, FACP  Electronically Signed    CDY/MedQ  DD: 12/01/2006  DT: 12/01/2006  Job #: [id number]   cc:   Gabriel Earing, M.D.

## 2011-03-14 NOTE — Op Note (Signed)
[place]. [facility]  Patient:    Kristopher Gay, Kristopher Gay                         MRN: [id number] Proc. Date: 12/18/00 Adm. Date:  [id number] Attending:  Macario Carls                           Operative Report  PREOPERATIVE DIAGNOSIS:  Subacute thrombophlebitis of the greater saphenous system and the lesser saphenous system in the left lower extremity.  POSTOPERATIVE DIAGNOSIS:  Subacute thrombophlebitis of the greater saphenous system and the lesser saphenous system in the left lower extremity.  OPERATION:  Stripping of the greater and lesser saphenous vein.  SURGEON:  Theresia Majors. Tanda Rockers, M.D.  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  This process continued, became excruciatingly painful associated with some edema.  He was seen on multiple times in the office with a Duplex scan showing thrombosed greater saphenous vein segments, as well as lesser saphenous vein segments.  The deep system was completely normal on multiple examinations using Duplex technology.  Due to his progressive pain and we have recommended that the patient undergo stripping of his subacute thrombophlebitic veins.  TECHNIQUE:  Patient was taken to the operating room, placed on the table in supine position.  Anesthesia was achieved with a spinal technique.  The left lower extremity was prepped with Betadine soap, painted with Betadine solution and sterile drapes were applied.  Multiple transverse incisions were made through which the thrombosed segments of large serpiginous veins were extracted using a blunt and sharp technique.  The distal portion of saphenous vein became supple.  This was divided between hemoclips.  Proximally above the knee in the junction of the proximal and medial third, saphenous vein was identified and appeared to be spared from disease  and this again was divided between hemoclips.  The vein was removed in total using a series of transverse incisions guided by blunt finger dissection.  At the level of the lesser saphenous system, the patient was rotated laterally and a counter incision was made and a thrombosed segment of this vessel was identified and excised using a similar technique.  The tracts of the excised vein were irrigated with antibiotic-containing solution.  Hemorrhage was well-controlled.  The access incisions were reapproximated with solitary horizontal mattress sutures.  A sterile compressive dressing was applied.  The patient tolerated the procedures well and was transferred from the operating room to recovery room in satisfactory condition with stable vital signs. DD:  12/18/00 TD:  12/18/00 Job: [id number]

## 2011-03-14 NOTE — Assessment & Plan Note (Signed)
[facility]                               PULMONARY OFFICE NOTE   NAME:Kristopher Gay, Kristopher Gay                         MRN:          [id number]  DATE:06/02/2006                            DOB:          May 12, 1951    PROBLEM LIST:  1.  Asthma.  2.  Allergic rhinitis.   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  He means he has a persistent  mild sense of chest congestion.  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  He  also recognizes frequent reflux, which we have discussed.  He wakes up  occasionally at night, but cannot tell why.  He had hurt his back and  shoulder, which is limiting him some in his general activities, and he has  put on some weight.  He had previously failed a trial of Spiriva and of  Singulair rather remotely.  We discussed maintenance options.  He continues  his allergy vaccine at 1:10, which he gets here with no problems.   MEDICATIONS:  1.  Advair 250/50.  2.  Albuterol rescue inhaler, used about once every other day or so.  3.  Allegra 60 mg.   ALLERGIES:  No medication allergy.   OBJECTIVE:  VITAL SIGNS:  Weight 270, representing a 9 pound increase in the  past year.  He was a muscular man, now putting on a little extra weight. BP  122/72 , pulse regular 71, room air saturation 95%.  ENT:  Eyes, nose and throat are clear now.  There is no pharyngeal erythema  and voice quality is normal.  LUNGS:  Quiet and clear.  CARDIAC:  Heart sounds are regular without murmur or gallop.   IMPRESSION:  1.  Asthma.  2.  I suspect a component of reflux contributing to some of his sense of      chest congestion.  3.  We discussed the possibility that part of the current problem is related      to air quality.   PLAN:  1.  He is going to retry Singulair samples 10 mg daily for 2 weeks.  2.   Daily Prilosec OTC for 1 month, with reflux precautions.  3.  We refilled his routine meds, sticking with Advair 250/50, but giving a      sample of Advair      500/50 to use 1 b.i.d. for 2 weeks.  We discussed the steroid and black      box issues.  4.  Schedule return in 6 months, earlier p.r.n.                                   [name]. Maple Hudson, MD, [facility], FACP   CDY/MedQ  DD:  06/02/2006  DT:  06/03/2006  Job #:  [id number]   cc:   Gabriel Earing, MD

## 2011-03-14 NOTE — Assessment & Plan Note (Signed)
PRIMARY CARE PHYSICIAN:  Sharlot Gowda, M.D.   NEUROLOGIST:  Marolyn Hammock. Thad Ranger, MD   A 60 year old male who has a remote workers' comp injury dating back to  2005, which has been subsequently settled.  He has had left rotator cuff  tear as well as cervical spondylosis and potentially some cervical  radiculitis in the past.  From the pain management standpoint, he has  been quite stable over time, he is really just on a small dose of Kadian  long-acting, morphine 20 mg q.24 h.  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  He  does have a peroneal neuropathy, which was of unclear etiology, but has  benefited from a left AFO.  His MRI only showed some left L3 nerve root  encroachment and nothing on the right side.   He has had no new problems since I last saw him.  For review of systems,  please see health and history form.  He has pertinent positive  depression, anxiety, confusion, dizziness, trouble walking, numbness,  tingling, constipation, coughing, wheezing, and night sweats.   SOCIAL HISTORY:  Lives alone.   FAMILY HISTORY:  High blood pressure, epilepsy, and cancer.   His blood pressure today is elevated at 170/91, pulse 89, respirations  20, and O2 sat 98% on room air.  Well-developed, well-nourished male in  no acute distress.  Neck has good range of motion.  Upper extremity  strength is normal.  Lower extremity strength is normal.  He does have  some varicosities, left lower extremity and some pitting edema from the  brace in the left leg.   Gait shows a well compensated gait using the AFO.   IMPRESSION:  Lumbosacral neuritis, radiculitis, lumbosacral disk  disorder, and lumbar spondylosis as well as history of cervical  spondylosis.   PLAN:  Continue with his current medications, keeping him reasonably  active.  He has his own ideas about his activity level, states that he  really wants to lay down at certain  times during the day and this is  really why he cannot work that he needs to lay down from time to time,  sitting just does not help, neither does standing.  I have emphasized  need to continue his lumbar stabilization program, which he states he  does.  I will see him back in 3 months and nursing visit in 2 months.  Monitor compliance.  Random urine drug screens.      Erick Colace, M.D.  Electronically Signed     AEK/MedQ  D:  07/13/2009 14:01:46  T:  07/14/2009 04:33:36  Job #:  [id number]   cc:   Sharolyn Douglas, M.D.  Fax: [phone]

## 2011-03-21 ENCOUNTER — Ambulatory Visit (INDEPENDENT_AMBULATORY_CARE_PROVIDER_SITE_OTHER): Payer: Medicare Other

## 2011-03-21 DIAGNOSIS — J309 Allergic rhinitis, unspecified: Secondary | ICD-10-CM

## 2011-03-28 ENCOUNTER — Ambulatory Visit (INDEPENDENT_AMBULATORY_CARE_PROVIDER_SITE_OTHER): Payer: Medicare Other

## 2011-03-28 DIAGNOSIS — J309 Allergic rhinitis, unspecified: Secondary | ICD-10-CM

## 2011-04-04 ENCOUNTER — Ambulatory Visit (INDEPENDENT_AMBULATORY_CARE_PROVIDER_SITE_OTHER): Payer: Medicare Other

## 2011-04-04 DIAGNOSIS — J309 Allergic rhinitis, unspecified: Secondary | ICD-10-CM

## 2011-04-11 ENCOUNTER — Ambulatory Visit (INDEPENDENT_AMBULATORY_CARE_PROVIDER_SITE_OTHER): Payer: Medicare Other

## 2011-04-11 DIAGNOSIS — J309 Allergic rhinitis, unspecified: Secondary | ICD-10-CM

## 2011-04-16 ENCOUNTER — Encounter: Payer: Medicare Other | Attending: Physical Medicine & Rehabilitation

## 2011-04-16 DIAGNOSIS — IMO0002 Reserved for concepts with insufficient information to code with codable children: Secondary | ICD-10-CM | POA: Insufficient documentation

## 2011-04-16 DIAGNOSIS — M62838 Other muscle spasm: Secondary | ICD-10-CM | POA: Insufficient documentation

## 2011-04-16 DIAGNOSIS — G609 Hereditary and idiopathic neuropathy, unspecified: Secondary | ICD-10-CM | POA: Insufficient documentation

## 2011-04-16 DIAGNOSIS — M216X9 Other acquired deformities of unspecified foot: Secondary | ICD-10-CM | POA: Insufficient documentation

## 2011-04-16 DIAGNOSIS — M79609 Pain in unspecified limb: Secondary | ICD-10-CM | POA: Insufficient documentation

## 2011-04-16 DIAGNOSIS — R209 Unspecified disturbances of skin sensation: Secondary | ICD-10-CM | POA: Insufficient documentation

## 2011-04-16 DIAGNOSIS — R42 Dizziness and giddiness: Secondary | ICD-10-CM | POA: Insufficient documentation

## 2011-04-16 DIAGNOSIS — M5137 Other intervertebral disc degeneration, lumbosacral region: Secondary | ICD-10-CM

## 2011-04-16 DIAGNOSIS — M47817 Spondylosis without myelopathy or radiculopathy, lumbosacral region: Secondary | ICD-10-CM

## 2011-04-16 DIAGNOSIS — M25519 Pain in unspecified shoulder: Secondary | ICD-10-CM | POA: Insufficient documentation

## 2011-04-16 DIAGNOSIS — G5730 Lesion of lateral popliteal nerve, unspecified lower limb: Secondary | ICD-10-CM

## 2011-04-16 DIAGNOSIS — K5900 Constipation, unspecified: Secondary | ICD-10-CM | POA: Insufficient documentation

## 2011-04-16 DIAGNOSIS — R110 Nausea: Secondary | ICD-10-CM | POA: Insufficient documentation

## 2011-04-18 ENCOUNTER — Ambulatory Visit (INDEPENDENT_AMBULATORY_CARE_PROVIDER_SITE_OTHER): Payer: Medicare Other

## 2011-04-18 DIAGNOSIS — J309 Allergic rhinitis, unspecified: Secondary | ICD-10-CM

## 2011-04-21 ENCOUNTER — Encounter: Payer: Self-pay | Admitting: Internal Medicine

## 2011-04-25 ENCOUNTER — Ambulatory Visit (INDEPENDENT_AMBULATORY_CARE_PROVIDER_SITE_OTHER): Payer: Medicare Other

## 2011-04-25 DIAGNOSIS — J309 Allergic rhinitis, unspecified: Secondary | ICD-10-CM

## 2011-05-01 ENCOUNTER — Ambulatory Visit (INDEPENDENT_AMBULATORY_CARE_PROVIDER_SITE_OTHER): Payer: Medicare Other

## 2011-05-01 DIAGNOSIS — J309 Allergic rhinitis, unspecified: Secondary | ICD-10-CM

## 2011-05-08 ENCOUNTER — Encounter: Payer: Medicare Other | Attending: Neurosurgery

## 2011-05-08 ENCOUNTER — Ambulatory Visit: Payer: Medicare Other | Admitting: Physical Medicine & Rehabilitation

## 2011-05-08 ENCOUNTER — Ambulatory Visit (INDEPENDENT_AMBULATORY_CARE_PROVIDER_SITE_OTHER): Payer: Medicare Other

## 2011-05-08 DIAGNOSIS — J309 Allergic rhinitis, unspecified: Secondary | ICD-10-CM

## 2011-05-08 DIAGNOSIS — IMO0002 Reserved for concepts with insufficient information to code with codable children: Secondary | ICD-10-CM | POA: Insufficient documentation

## 2011-05-08 DIAGNOSIS — M216X9 Other acquired deformities of unspecified foot: Secondary | ICD-10-CM | POA: Insufficient documentation

## 2011-05-08 DIAGNOSIS — M47817 Spondylosis without myelopathy or radiculopathy, lumbosacral region: Secondary | ICD-10-CM

## 2011-05-08 DIAGNOSIS — G609 Hereditary and idiopathic neuropathy, unspecified: Secondary | ICD-10-CM | POA: Insufficient documentation

## 2011-05-08 NOTE — Assessment & Plan Note (Signed)
A 60 year old male with history of chronic low back pain as well as a peroneal neuropathy left lower extremity.  He has complained of increasing pain on the right side of the low back radiating into the thigh area.  He has not had any falls.  No trauma.  He states he has had this on and off in the past and in fact I have recommended diagnostic medial branch blocks in the past.  The patient has been followed in this office for 2 years.  We recommended medial branch blocks.  The patient was not interested in that time.  The patient now requesting an epidural injection which he states he was offered in the past at another office. His last MRI was in 2008.  He states he has had x-rays since that time at [facility].  He has an orthopedic appointment this Monday.  His previous MRI showed L4-5 disk potentially impinging upon L4 and L5 nerve roots.  The patient denies any pain that goes beyond the knee.  He has diffuse numbness and tingling.  He has been evaluated for peripheral neuropathy in the past.  His pain is rated as 8/10.  It tends to get better when he flexes forward worsened when he flexes backward or extends backward.  Walking tolerance is about 5 minutes.  He is independent with all of his ADLs, needs some help with household duties and shopping.  REVIEW OF SYSTEMS:  Basically positive.  He marks suicidal thoughts, but no plan.  It is more of a passive ideation from time to time.  He sees his primary care physician for other medical issues including asthma and hypertension.  He also sees Pulmonology.  Blood pressure 150/77, pulse 84, respirations 16 and O2 sat 95% on room air.  Well-developed, well-nourished male, in no acute distress.  Mood and affect are appropriate.  His back has minor tenderness in the lumbosacral junction.  His straight leg raise is negative.  He has normal deep tendon reflex right knee mildly diminished right ankle.  Sensation is diffusely reduced in the  upper and lower extremities nothing dermatomal.  IMPRESSION:  Lumbosacral spondylosis.  I think this is the main problem he is dealing with.  I would recommend diagnostic/therapeutic medial branch blocks.  He has received some information on this.  He is going to consider this after he talks with Dr. Alveda Reasons from spine surgery.  I really do not think anything suggest radiculopathy at this point, although he is at risk.  I would not recommend MRI at this point unless he develops pain that goes beyond the knee.  He is not complaining of any weakness, bowel or bladder problems.  I will schedule for medial branch blocks, but once again he states he is not sure if he wants to go through with this until he talks with his orthopedic surgeon.  I have written out his Kadian 30 mg to be filled prior to May 16, 2011.     Erick Colace, M.D. Electronically Signed    AEK/MedQ D:  05/08/2011 09:22:18  T:  05/08/2011 09:49:02  Job #:  [id number]  cc:   Nelda Severe, MD Fax: [phone]  Sharlot Gowda, M.D. Fax: [phone]

## 2011-05-16 ENCOUNTER — Ambulatory Visit (INDEPENDENT_AMBULATORY_CARE_PROVIDER_SITE_OTHER): Payer: Medicare Other

## 2011-05-16 DIAGNOSIS — J309 Allergic rhinitis, unspecified: Secondary | ICD-10-CM

## 2011-05-22 ENCOUNTER — Ambulatory Visit (INDEPENDENT_AMBULATORY_CARE_PROVIDER_SITE_OTHER): Payer: Medicare Other

## 2011-05-22 DIAGNOSIS — J309 Allergic rhinitis, unspecified: Secondary | ICD-10-CM

## 2011-05-29 ENCOUNTER — Encounter: Payer: Medicare Other | Admitting: Physical Medicine & Rehabilitation

## 2011-05-29 ENCOUNTER — Encounter: Payer: Medicare Other | Attending: Neurosurgery

## 2011-05-29 ENCOUNTER — Ambulatory Visit (INDEPENDENT_AMBULATORY_CARE_PROVIDER_SITE_OTHER): Payer: Medicare Other

## 2011-05-29 DIAGNOSIS — J309 Allergic rhinitis, unspecified: Secondary | ICD-10-CM

## 2011-05-29 DIAGNOSIS — M5137 Other intervertebral disc degeneration, lumbosacral region: Secondary | ICD-10-CM

## 2011-05-29 DIAGNOSIS — G609 Hereditary and idiopathic neuropathy, unspecified: Secondary | ICD-10-CM | POA: Insufficient documentation

## 2011-05-29 DIAGNOSIS — IMO0002 Reserved for concepts with insufficient information to code with codable children: Secondary | ICD-10-CM | POA: Insufficient documentation

## 2011-05-29 DIAGNOSIS — M216X9 Other acquired deformities of unspecified foot: Secondary | ICD-10-CM | POA: Insufficient documentation

## 2011-05-29 DIAGNOSIS — M47817 Spondylosis without myelopathy or radiculopathy, lumbosacral region: Secondary | ICD-10-CM

## 2011-05-29 NOTE — Assessment & Plan Note (Signed)
CHIEF COMPLAINT:  Back pain.  A 60 year old male with chronic low back pain.  He has undergone Orthopedic Surgery evaluation, Dr. Alveda Reasons.  He brought films with him today which I reviewed.  This shows evidence of dextroconvex as well as dextrorotatory scoliosis along with decreased disk bases L1 through S1. He discussed surgery with Dr. Alveda Reasons, but decided he did not want to pursue this option.  The patient continues with pain management.  His meds include Kadian 30 mg per day as well as gabapentin 600 q.a.m., 300 q.p.m., and 600 nightly.  He was scheduled for medial branch block today.  IMPRESSION:  Lumbar degenerative disk as well as lumbar spondylosis.  As I discussed with the patient I cannot tell him exactly how much of his pain is coming from his disk versus his facets certainly with his scoliosis that does disposing to facet mediated pain.  We can reschedule the medial branch blocks once he has a driver available to him.  I will continue his Kadian and gabapentin, discussed with the patient, agrees with plan.  I also discussed the results of the x-ray.     Erick Colace, M.D. Electronically Signed    AEK/MedQ D:  05/29/2011 09:42:26  T:  05/29/2011 09:54:32  Job #:  [id number]  cc:   Nelda Severe, MD Fax: [phone]

## 2011-06-06 ENCOUNTER — Ambulatory Visit (INDEPENDENT_AMBULATORY_CARE_PROVIDER_SITE_OTHER): Payer: Medicare Other

## 2011-06-06 DIAGNOSIS — J309 Allergic rhinitis, unspecified: Secondary | ICD-10-CM

## 2011-06-12 ENCOUNTER — Ambulatory Visit (INDEPENDENT_AMBULATORY_CARE_PROVIDER_SITE_OTHER): Payer: Medicare Other

## 2011-06-12 DIAGNOSIS — J309 Allergic rhinitis, unspecified: Secondary | ICD-10-CM

## 2011-06-19 ENCOUNTER — Ambulatory Visit (INDEPENDENT_AMBULATORY_CARE_PROVIDER_SITE_OTHER): Payer: Medicare Other

## 2011-06-19 DIAGNOSIS — J309 Allergic rhinitis, unspecified: Secondary | ICD-10-CM

## 2011-06-23 ENCOUNTER — Encounter: Payer: Medicare Other | Admitting: Physical Medicine & Rehabilitation

## 2011-06-26 ENCOUNTER — Ambulatory Visit (INDEPENDENT_AMBULATORY_CARE_PROVIDER_SITE_OTHER): Payer: Medicare Other

## 2011-06-26 DIAGNOSIS — J309 Allergic rhinitis, unspecified: Secondary | ICD-10-CM

## 2011-07-04 ENCOUNTER — Ambulatory Visit (INDEPENDENT_AMBULATORY_CARE_PROVIDER_SITE_OTHER): Payer: Medicare Other

## 2011-07-04 DIAGNOSIS — J309 Allergic rhinitis, unspecified: Secondary | ICD-10-CM

## 2011-07-07 ENCOUNTER — Ambulatory Visit (INDEPENDENT_AMBULATORY_CARE_PROVIDER_SITE_OTHER): Payer: Medicare Other

## 2011-07-07 DIAGNOSIS — J309 Allergic rhinitis, unspecified: Secondary | ICD-10-CM

## 2011-07-11 ENCOUNTER — Ambulatory Visit (INDEPENDENT_AMBULATORY_CARE_PROVIDER_SITE_OTHER): Payer: Medicare Other

## 2011-07-11 DIAGNOSIS — J309 Allergic rhinitis, unspecified: Secondary | ICD-10-CM

## 2011-07-17 ENCOUNTER — Encounter: Payer: Medicare Other | Attending: Neurosurgery

## 2011-07-17 ENCOUNTER — Encounter: Payer: Medicare Other | Admitting: Physical Medicine & Rehabilitation

## 2011-07-17 DIAGNOSIS — G609 Hereditary and idiopathic neuropathy, unspecified: Secondary | ICD-10-CM | POA: Insufficient documentation

## 2011-07-17 DIAGNOSIS — IMO0002 Reserved for concepts with insufficient information to code with codable children: Secondary | ICD-10-CM | POA: Insufficient documentation

## 2011-07-17 DIAGNOSIS — M216X9 Other acquired deformities of unspecified foot: Secondary | ICD-10-CM | POA: Insufficient documentation

## 2011-07-17 DIAGNOSIS — M47817 Spondylosis without myelopathy or radiculopathy, lumbosacral region: Secondary | ICD-10-CM

## 2011-07-17 NOTE — Procedures (Signed)
Kristopher Gay, Kristopher Gay NO.:  [id number]  MEDICAL RECORD NO.:  [id number]           PATIENT TYPE:  O  LOCATION:  [facility]                          FACILITY:  [facility]  PHYSICIAN:  Erick Colace, M.D.DATE OF BIRTH:  09/03/1951  DATE OF PROCEDURE: DATE OF DISCHARGE:                              OPERATIVE REPORT  Bilateral L5 dorsal ramus, bilateral L4 and bilateral L3 medial branch blocks, this is under fluoroscopic guidance.  INDICATION:  Lumbar spondylosis without myelopathy.  Informed consent was obtained after describing risks and benefits of the procedure with the patient.  These include bleeding, bruising and infection.  He elects to proceed and has given written consent.  The patient placed prone on fluoroscopy table.  Betadine prep, sterile drape 25-gauge inch and half needle was used to anesthetize the skin and subcu tissue.  After time-out was taken to ensure proper patient and procedure.  Then a, 22-gauge, 3-1/2-inch spinal needle was inserted under fluoroscopic guidance first starting at the left S1 SAP sacroiliac junction, bone contact made, confirmed with Omnipaque 180 x 0.5 mL demonstrating no intravascular uptake.  Then, 1.5 mL of solution of 1 mL of 4 mg/mL dexamethasone and 2 mL of 2% MPF lidocaine were injected. Then, a left L5 SAP transverse process junction targeted, bone contact made.  Omnipaque 180 x 0.5 mL demonstrated no intravascular uptake, then 0.5 mL of the dexamethasone lidocaine solution was injected in the left L4, SAP transverse process junction targeted, bone contact made. Omnipaque 180 x 0.5 mL demonstrated no intravascular uptake, then 0.5 mL of the dexamethasone lidocaine solution was injected.  The same procedure was repeated on the right side using same needle injectate and technique.  The patient tolerated the procedure well.  Pre and post injection vitals stable.  Post injection instructions given.     Erick Colace,  M.D. Electronically Signed    AEK/MEDQ  D:  07/17/2011 13:03:41  T:  07/17/2011 13:21:21  Job:  [id number]  cc:   Nelda Severe, MD Fax: [phone]

## 2011-07-18 ENCOUNTER — Ambulatory Visit (INDEPENDENT_AMBULATORY_CARE_PROVIDER_SITE_OTHER): Payer: Medicare Other

## 2011-07-18 DIAGNOSIS — J309 Allergic rhinitis, unspecified: Secondary | ICD-10-CM

## 2011-07-25 ENCOUNTER — Ambulatory Visit (INDEPENDENT_AMBULATORY_CARE_PROVIDER_SITE_OTHER): Payer: Medicare Other

## 2011-07-25 DIAGNOSIS — J309 Allergic rhinitis, unspecified: Secondary | ICD-10-CM

## 2011-07-31 ENCOUNTER — Ambulatory Visit (INDEPENDENT_AMBULATORY_CARE_PROVIDER_SITE_OTHER): Payer: Medicare Other

## 2011-07-31 DIAGNOSIS — J309 Allergic rhinitis, unspecified: Secondary | ICD-10-CM

## 2011-08-04 ENCOUNTER — Encounter: Payer: Self-pay | Admitting: Internal Medicine

## 2011-08-08 ENCOUNTER — Ambulatory Visit: Payer: Medicare Other | Admitting: Physical Medicine & Rehabilitation

## 2011-08-08 ENCOUNTER — Ambulatory Visit (INDEPENDENT_AMBULATORY_CARE_PROVIDER_SITE_OTHER): Payer: Medicare Other

## 2011-08-08 ENCOUNTER — Encounter: Payer: Medicare Other | Attending: Neurosurgery

## 2011-08-08 DIAGNOSIS — Z23 Encounter for immunization: Secondary | ICD-10-CM

## 2011-08-08 DIAGNOSIS — J309 Allergic rhinitis, unspecified: Secondary | ICD-10-CM

## 2011-08-08 DIAGNOSIS — IMO0002 Reserved for concepts with insufficient information to code with codable children: Secondary | ICD-10-CM | POA: Insufficient documentation

## 2011-08-08 DIAGNOSIS — G609 Hereditary and idiopathic neuropathy, unspecified: Secondary | ICD-10-CM | POA: Insufficient documentation

## 2011-08-08 DIAGNOSIS — M216X9 Other acquired deformities of unspecified foot: Secondary | ICD-10-CM | POA: Insufficient documentation

## 2011-08-08 DIAGNOSIS — M47817 Spondylosis without myelopathy or radiculopathy, lumbosacral region: Secondary | ICD-10-CM

## 2011-08-08 NOTE — Assessment & Plan Note (Signed)
REASON FOR VISIT:  Low back pain.  HISTORY:  A 60 year old male with chronic low back pain as well as lower extremity pain.  He has history of peroneal neuropathy.  On the left, he has had medial branch block, bilateral L5 dorsal ramus, bilateral L4 and bilateral L3 medial branches under fluoroscopic guidance on July 17, 2011.  He has had good relief in that he no longer has a sharp pain that really stops him in his tracks anymore.  He is quite pleased about this.  Sometimes his pain did radiate down to his knee and radiating right knee pain, is no longer present as well.  He has had no other new medical problems.  He did state he increased his vitamin D supplement to 3000 use per day.  I looked this up for him, this is within range for most common conditions.  His average pain is 6/10.  Pain improves with rest, medication, TENS, therapy, change of positioning.  Walking tolerance 5 minutes, he does not climb steps, she does drive but short distances.  REVIEW OF SYSTEMS:  Basically positive and includes depression, anxiety, and suicidal thoughts.  However, he has no active plan on the suicidal thought more of a passive ideation that it has been in the past.  Overall, he is quite encouraged.  OBJECTIVE:  VITAL SIGNS:  Blood pressure 147/60, pulse 68, respirations 16, and O2 sat 97% on room air. GENERAL:  No acute distress.  Mood and affect appropriate. MUSCULOSKELETAL:  His back has no tenderness to palpation except at the lumbosacral junction.  He has pain with extension of his lumbar spine, but not with flexion.  He has full flexion and range of motion.  His straight leg raising test is negative.  Lower extremity strength is normal with exception of left ankle dorsiflexor.  IMPRESSION:  Lumbar spondylosis without myelopathy.  He responded nicely to medial branch blocks.  As I discussed with the patient, I really cannot predict how long it will last.  It may last another  week or another month or more.  I will see him back in 2 months and [facility] in 1 month.  We may think about reducing his Kadian to 20 mg.     Erick Colace, M.D. Electronically Signed    AEK/MedQ D:  08/08/2011 12:00:40  T:  08/08/2011 13:19:06  Job #:  [id number]  cc:   Nelda Severe, MD Fax: [phone]

## 2011-08-14 ENCOUNTER — Ambulatory Visit (INDEPENDENT_AMBULATORY_CARE_PROVIDER_SITE_OTHER): Payer: Medicare Other

## 2011-08-14 DIAGNOSIS — J309 Allergic rhinitis, unspecified: Secondary | ICD-10-CM

## 2011-08-18 ENCOUNTER — Ambulatory Visit: Payer: Medicare Other | Admitting: Internal Medicine

## 2011-08-21 ENCOUNTER — Ambulatory Visit (INDEPENDENT_AMBULATORY_CARE_PROVIDER_SITE_OTHER): Payer: Medicare Other

## 2011-08-21 DIAGNOSIS — J309 Allergic rhinitis, unspecified: Secondary | ICD-10-CM

## 2011-08-28 ENCOUNTER — Ambulatory Visit (INDEPENDENT_AMBULATORY_CARE_PROVIDER_SITE_OTHER): Payer: Medicare Other

## 2011-08-28 DIAGNOSIS — J309 Allergic rhinitis, unspecified: Secondary | ICD-10-CM

## 2011-09-04 ENCOUNTER — Ambulatory Visit (INDEPENDENT_AMBULATORY_CARE_PROVIDER_SITE_OTHER): Payer: Medicare Other

## 2011-09-04 DIAGNOSIS — J309 Allergic rhinitis, unspecified: Secondary | ICD-10-CM

## 2011-09-11 ENCOUNTER — Ambulatory Visit (INDEPENDENT_AMBULATORY_CARE_PROVIDER_SITE_OTHER): Payer: Medicare Other

## 2011-09-11 DIAGNOSIS — J309 Allergic rhinitis, unspecified: Secondary | ICD-10-CM

## 2011-09-16 ENCOUNTER — Encounter: Payer: Medicare Other | Attending: Neurosurgery | Admitting: Neurosurgery

## 2011-09-16 DIAGNOSIS — M47817 Spondylosis without myelopathy or radiculopathy, lumbosacral region: Secondary | ICD-10-CM

## 2011-09-16 DIAGNOSIS — M545 Low back pain, unspecified: Secondary | ICD-10-CM | POA: Insufficient documentation

## 2011-09-16 DIAGNOSIS — M79609 Pain in unspecified limb: Secondary | ICD-10-CM | POA: Insufficient documentation

## 2011-09-16 DIAGNOSIS — G894 Chronic pain syndrome: Secondary | ICD-10-CM

## 2011-09-16 NOTE — Assessment & Plan Note (Signed)
This is a patient of Dr. Wynn Banker', seen for bilateral hand, low back, and lower extremity pain.  He rates his pain as a 6, sharp, burning, dull, stabbing, tingling, aching, constant pain.  General activity level is a 9.  Pain is worse at night.  Sleep patterns are poor.  All activities aggravate.  Rest, therapy, pacing medication, TENS unit help. He uses a cane and leg brace for ambulation.  He does not climb steps. He can drive and walk about 5 minutes at a time.  He is unemployed, needs some help with household duties.  REVIEW OF SYSTEMS:  Notable for difficulties described above as well as widespread other problems such as fevers, night sweats, weight fluctuations, weakness, paresthesias, tingling, trouble walking, spasms, confusion, depression, anxiety, abdominal pain, nausea, constipation, respiratory problems, limb swelling, coughing, shortness of breath, wheezing.  He has had suicidal thoughts, nothing that active, he is encouraged to go to the hospital it that worsens.  PAST MEDICAL HISTORY:  Unchanged.  SOCIAL HISTORY:  Unchanged.  FAMILY HISTORY:  Unchanged.  PHYSICAL EXAMINATION:  VITAL SIGNS:  His blood pressure is 141/78, pulse 66, respirations 18, O2 sat is 96 on room air. CONSTITUTIONAL:  He is within normal limits.  He is alert and oriented x3.  He has a significant limp using a single-point cane and a leg brace. NEUROLOGIC:  The patient has fairly good range of motion in the lumbar spine.  Diminished strength With the left ankle dorsiflexor, otherwise his strength and sensation are intact.  IMPRESSION:  Lumbar spondylosis without myelopathy.  PLAN:  Refill Kadian 30 mg 1 p.o. daily, 30, brand name only, no refills.  His questions were encouraged and answered.  We will see him back in a month.     Sher Shampine L. Blima Dessert Electronically Signed    RLW/MedQ D:  09/16/2011 12:59:44  T:  09/16/2011 21:13:25  Job #:  [id number]

## 2011-09-19 ENCOUNTER — Ambulatory Visit (INDEPENDENT_AMBULATORY_CARE_PROVIDER_SITE_OTHER): Payer: Medicare Other

## 2011-09-19 ENCOUNTER — Encounter: Payer: Self-pay | Admitting: Internal Medicine

## 2011-09-19 ENCOUNTER — Ambulatory Visit (INDEPENDENT_AMBULATORY_CARE_PROVIDER_SITE_OTHER): Payer: Medicare Other | Admitting: Internal Medicine

## 2011-09-19 VITALS — BP 118/72 | HR 78 | Ht 75.0 in | Wt 265.4 lb

## 2011-09-19 DIAGNOSIS — J45909 Unspecified asthma, uncomplicated: Secondary | ICD-10-CM

## 2011-09-19 DIAGNOSIS — J309 Allergic rhinitis, unspecified: Secondary | ICD-10-CM

## 2011-09-19 DIAGNOSIS — J301 Allergic rhinitis due to pollen: Secondary | ICD-10-CM

## 2011-09-19 MED ORDER — FLUTICASONE-SALMETEROL 500-50 MCG/DOSE IN AEPB: 1.0000 | INHALATION_SPRAY | Freq: Two times a day (BID) | RESPIRATORY_TRACT | Status: DC

## 2011-09-19 NOTE — Progress Notes (Signed)
Patient ID: Kristopher Gay, male    DOB: 1951-06-07, 60 y.o.   MRN: [id number]  HPI 60 yo never smoker followed for allergic rhinitis and allergic asthma, complicated by chronic musculoskeletal pain after an injury. Last here August 16, 2010. That note reviewed. He got through the winter ok, still limited by neurologic complaints. He reports a change in cough, deeper with some tightness over last 4 months. No change in perennial sinus drainage and occasional hoarseness. He continues allergy vaccine here at 1:10. 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 He continues Advair 250, reporting no difference with Advair 500 or with Dulera. Nasal sprays seem to work.  Needs refill tessalon, epipen and flonase.   09/19/11-59 yo never smoker followed for allergic rhinitis and allergic asthma, complicated by chronic musculoskeletal pain after an injury. He has noted cough and wheeze more often with cool weather, and some shortness of breath which may be worse. Nothing dramatic. Singing triggers chest tightness. Cold air makes his nose run. Pain management has told him his substernal pain is related to his airways although I have felt it was more likely musculoskeletal. He has been using Advair 250 but wants to try going back to Advair 500 which we discussed.  Review of Systems See HPI Constitutional:   No-   weight loss, night sweats, fevers, chills, fatigue, lassitude. HEENT:   No-  headaches, difficulty swallowing, tooth/dental problems, sore throat,       No-  sneezing, itching, ear ache, nasal congestion, +post nasal drip,  CV:  No-   chest pain, orthopnea, PND, swelling in lower extremities, anasarca,                                  dizziness, palpitations Resp: +  shortness of breath with exertion, not at rest.              No-   productive cough,  No non-productive cough,  No- coughing up of blood.              No-   change in color of mucus.  + wheezing.   Skin: No-   rash  or lesions. GI:  No-   heartburn, indigestion, abdominal pain, nausea, vomiting, diarrhea,                 change in bowel habits, loss of appetite GU: No-   dysuria, change in color of urine, no urgency or frequency.  No- flank pain. MS:  No- acute  joint pain or swelling.  + decreased range of motion.  + back pain. Neuro-     nothing unusual Psych:  No- change in mood or affect. No depression or anxiety.  No memory loss.      Objective:   Physical Exam General- Alert, Oriented, Affect-appropriate, Distress- none acute Skin- rash-none, lesions- none, excoriation- none Lymphadenopathy- none Head- atraumatic            Eyes- Gross vision intact, PERRLA, conjunctivae clear secretions            Ears- Hearing, canals-normal            Nose- Clear, no-Septal dev, mucus, polyps, erosion, perforation             Throat- Mallampati II , mucosa clear , drainage- none, tonsils- atrophic Neck- flexible , trachea midline, no stridor , thyroid nl, carotid no bruit  Chest - symmetrical excursion , unlabored           Heart/CV- RRR , no murmur , no gallop  , no rub, nl s1 s2                           - JVD- none , edema- none, stasis changes- none, varices- none           Lung- clear to P&A, wheeze- none, cough- none , dullness-none, rub- none           Chest wall-  Abd- tender-no, distended-no, bowel sounds-present, HSM- no Br/ Gen/ Rectal- Not done, not indicated Extrem- cyanosis- none, clubbing, none, atrophy- none, brace left lower leg. Neuro- grossly intact to observation

## 2011-09-19 NOTE — Patient Instructions (Signed)
Second Advair script- this one for the 500 strength, so you have either 250 or 500 available to you.

## 2011-09-21 NOTE — Assessment & Plan Note (Signed)
I don't hear wheezing and he does not cough while here today. We discussed steroid absorption and high-strength Advair. We decided to let him have both the 250 and 500 strengths so that he can switch back and forth as he feels he needs.

## 2011-09-21 NOTE — Assessment & Plan Note (Signed)
He continues allergy vaccine and is currently satisfied that it has helped him. We have reviewed supplementation with antihistamines and nasal sprays as appropriate.

## 2011-09-26 ENCOUNTER — Ambulatory Visit (INDEPENDENT_AMBULATORY_CARE_PROVIDER_SITE_OTHER): Payer: Medicare Other

## 2011-09-26 DIAGNOSIS — J309 Allergic rhinitis, unspecified: Secondary | ICD-10-CM

## 2011-10-02 ENCOUNTER — Ambulatory Visit (INDEPENDENT_AMBULATORY_CARE_PROVIDER_SITE_OTHER): Payer: Medicare Other

## 2011-10-02 DIAGNOSIS — J309 Allergic rhinitis, unspecified: Secondary | ICD-10-CM

## 2011-10-09 ENCOUNTER — Ambulatory Visit (INDEPENDENT_AMBULATORY_CARE_PROVIDER_SITE_OTHER): Payer: Medicare Other

## 2011-10-09 DIAGNOSIS — J309 Allergic rhinitis, unspecified: Secondary | ICD-10-CM

## 2011-10-14 ENCOUNTER — Encounter: Payer: Medicare Other | Attending: Neurosurgery

## 2011-10-14 ENCOUNTER — Ambulatory Visit: Payer: Medicare Other | Admitting: Physical Medicine & Rehabilitation

## 2011-10-14 DIAGNOSIS — M216X9 Other acquired deformities of unspecified foot: Secondary | ICD-10-CM | POA: Insufficient documentation

## 2011-10-14 DIAGNOSIS — G609 Hereditary and idiopathic neuropathy, unspecified: Secondary | ICD-10-CM | POA: Insufficient documentation

## 2011-10-14 DIAGNOSIS — M47817 Spondylosis without myelopathy or radiculopathy, lumbosacral region: Secondary | ICD-10-CM

## 2011-10-14 DIAGNOSIS — IMO0002 Reserved for concepts with insufficient information to code with codable children: Secondary | ICD-10-CM | POA: Insufficient documentation

## 2011-10-15 NOTE — Assessment & Plan Note (Signed)
The patient of Dr. Wynn Banker seen for bilateral hand and low back pain and lower extremity pain.  There is no change in his pain with 6, it is sharp burning, dull stabbing, tingling, aching pain.  General activity level is 9.  Pain is worse at night.  Sleep patterns are poor.  All activities aggravate rest, therapy, pacing medication, TENS unit helps. He walks with a cane at times.  He does drive and climb steps and then walk about 5 minutes at a time and he does need help with household duties.  REVIEW OF SYSTEMS:  Notable for difficulties as above as well as some weight fluctuations, night sweats, constipation, abdominal pain, nausea, respiratory infections, limb swelling, coughing, wheezing, shortness of breath, paresthesias, dizziness, spasms, trouble walking, confusion, depression, anxiety.  Does have suicidal thoughts.  He has been encouraged to follow with the ED or his primary care if that worsens.  PAST MEDICAL HISTORY, SOCIAL HISTORY, FAMILY HISTORY:  Unchanged.  PHYSICAL EXAMINATION:  VITAL SIGNS:  His blood pressure was not obtained.  His pulse 79, respirations 18, O2 sats 94 on room air. MUSCULOSKELETAL:  His motor strength sensation are intact. CONSTITUTIONALLY:  He is within normal limits.  He is alert and oriented x3, has a slight limp.  ASSESSMENT:  Lumbar spondylosis without myelopathy.  PLAN:  Refill Kadian, he wants to go back to 20 mg 1 p.o. daily 30 with no refill.  His questions were encouraged and answered.  We will see him back in a month.     Jacquetta Polhamus L. Blima Dessert Electronically Signed    RLW/MedQ D:  10/14/2011 10:54:54  T:  10/15/2011 04:27:37  Job #:  [id number]

## 2011-10-16 ENCOUNTER — Ambulatory Visit (INDEPENDENT_AMBULATORY_CARE_PROVIDER_SITE_OTHER): Payer: Medicare Other

## 2011-10-16 DIAGNOSIS — J309 Allergic rhinitis, unspecified: Secondary | ICD-10-CM

## 2011-10-22 ENCOUNTER — Ambulatory Visit (INDEPENDENT_AMBULATORY_CARE_PROVIDER_SITE_OTHER): Payer: Medicare Other

## 2011-10-22 DIAGNOSIS — J309 Allergic rhinitis, unspecified: Secondary | ICD-10-CM

## 2011-10-30 ENCOUNTER — Ambulatory Visit (INDEPENDENT_AMBULATORY_CARE_PROVIDER_SITE_OTHER): Payer: Medicare Other

## 2011-10-30 DIAGNOSIS — J309 Allergic rhinitis, unspecified: Secondary | ICD-10-CM

## 2011-10-31 ENCOUNTER — Ambulatory Visit (INDEPENDENT_AMBULATORY_CARE_PROVIDER_SITE_OTHER): Payer: Medicare Other

## 2011-10-31 DIAGNOSIS — J309 Allergic rhinitis, unspecified: Secondary | ICD-10-CM

## 2011-11-03 ENCOUNTER — Encounter: Payer: Self-pay | Admitting: Internal Medicine

## 2011-11-07 ENCOUNTER — Ambulatory Visit (INDEPENDENT_AMBULATORY_CARE_PROVIDER_SITE_OTHER): Payer: Medicare Other

## 2011-11-07 DIAGNOSIS — J309 Allergic rhinitis, unspecified: Secondary | ICD-10-CM

## 2011-11-14 ENCOUNTER — Ambulatory Visit (INDEPENDENT_AMBULATORY_CARE_PROVIDER_SITE_OTHER): Payer: Medicare Other

## 2011-11-14 ENCOUNTER — Encounter: Payer: Medicare Other | Attending: Neurosurgery | Admitting: Neurosurgery

## 2011-11-14 DIAGNOSIS — M47817 Spondylosis without myelopathy or radiculopathy, lumbosacral region: Secondary | ICD-10-CM

## 2011-11-14 DIAGNOSIS — J309 Allergic rhinitis, unspecified: Secondary | ICD-10-CM

## 2011-11-14 DIAGNOSIS — G894 Chronic pain syndrome: Secondary | ICD-10-CM

## 2011-11-14 DIAGNOSIS — M545 Low back pain, unspecified: Secondary | ICD-10-CM | POA: Insufficient documentation

## 2011-11-14 DIAGNOSIS — M79609 Pain in unspecified limb: Secondary | ICD-10-CM | POA: Insufficient documentation

## 2011-11-15 NOTE — Assessment & Plan Note (Signed)
This is patient of Dr. Wynn Banker, seen for bilateral hand, low back pain, and lower extremity pain.  He reports no change in his pain.  It is 6, sharp, burning, dull stabbing pain.  General activity level is 9. Pain is worse at night.  All activities aggravate; rest, therapy, pacing, TENS unit, medication helps.  Mobility, he walks with leg brace. He does not climb steps.  He does drive and walk about 5 minutes at a time.  Functionally, he is on disability and needs help with household duties.  REVIEW OF SYSTEMS:  Notable for difficulties as described above as well as paresthesias, spasms, dizziness, depression  confusion, anxiety, suicidal thoughts, abdominal pain, constipation, nausea.  He is encouraged to see his primary care ED if his suicidal thoughts worsen. Past UDS and pill counts consistent.  Past medical history, social history, family history are unchanged.  PHYSICAL EXAMINATION:  VITAL SIGNS:  His blood pressure is 137/77, pulse 69, respirations 18, O2 sats 93 on room air. MUSCULOSKELETAL:  Motor strength and sensation are intact.  NEUROLOGIC: Constitutionally, he is within normal limits.  He is alert and oriented x3, has a brace on his left leg, does walk with significant limp.  ASSESSMENT:  Lumbar spondylosis without myelopathy.  PLAN:  Refill Kadian 20 mg 1 p.o. daily 30, with no refills.  Questions were encouraged and answered.  He will follow up here in a month.     Ronesha Heenan L. Blima Dessert Electronically Signed    RLW/MedQ D:  11/14/2011 12:53:47  T:  11/15/2011 04:26:50  Job #:  [id number]

## 2011-11-17 ENCOUNTER — Ambulatory Visit (INDEPENDENT_AMBULATORY_CARE_PROVIDER_SITE_OTHER): Payer: Medicare Other | Admitting: Family Medicine

## 2011-11-17 ENCOUNTER — Other Ambulatory Visit: Payer: Self-pay | Admitting: Family Medicine

## 2011-11-17 ENCOUNTER — Encounter: Payer: Self-pay | Admitting: Family Medicine

## 2011-11-17 VITALS — BP 132/78 | HR 72 | Ht 75.0 in | Wt 260.0 lb

## 2011-11-17 DIAGNOSIS — M549 Dorsalgia, unspecified: Secondary | ICD-10-CM

## 2011-11-17 DIAGNOSIS — R6882 Decreased libido: Secondary | ICD-10-CM

## 2011-11-17 DIAGNOSIS — G8929 Other chronic pain: Secondary | ICD-10-CM | POA: Insufficient documentation

## 2011-11-17 LAB — TESTOSTERONE: Testosterone: 508.4 ng/dL (ref 250–890)

## 2011-11-17 NOTE — Patient Instructions (Signed)
I will call you tomorrow with the results of the blood work 

## 2011-11-17 NOTE — Progress Notes (Signed)
  Subjective:    Patient ID: Kristopher Gay, male    DOB: 1951/01/12, 61 y.o.   MRN: [id number]  HPI He is here for consultation. He states that he stopped taking his blood pressure medicine in August for concerns over liver needle issues. He has lost weight and has been monitoring his blood pressure which has always been low since then. He notes decreased libido, energy and stamina. He is concerned that this could be from his chronic back pain meds. He continues to be followed by the pain clinic. He continues on medications listed in the chart.  Review of Systems     Objective:   Physical Exam Alert and in no distress otherwise not examined       Assessment & Plan:   1. Chronic back pain    2. Libido, decreased  Testosterone   no further need for blood pressure medicine at this time. I will await the results of the testosterone. Explained that we might need to check this number again.

## 2011-11-18 ENCOUNTER — Institutional Professional Consult (permissible substitution): Payer: Medicare Other | Admitting: Family Medicine

## 2011-11-18 LAB — TSH: TSH: 1.97 u[IU]/mL (ref 0.350–4.500)

## 2011-11-21 ENCOUNTER — Ambulatory Visit (INDEPENDENT_AMBULATORY_CARE_PROVIDER_SITE_OTHER): Payer: Medicare Other

## 2011-11-21 DIAGNOSIS — J309 Allergic rhinitis, unspecified: Secondary | ICD-10-CM

## 2011-11-24 ENCOUNTER — Encounter: Payer: Self-pay | Admitting: Family Medicine

## 2011-11-24 ENCOUNTER — Ambulatory Visit (INDEPENDENT_AMBULATORY_CARE_PROVIDER_SITE_OTHER): Payer: Medicare Other | Admitting: Family Medicine

## 2011-11-24 VITALS — BP 120/68 | HR 78 | Ht 74.0 in | Wt 265.0 lb

## 2011-11-24 DIAGNOSIS — N529 Male erectile dysfunction, unspecified: Secondary | ICD-10-CM

## 2011-11-24 NOTE — Progress Notes (Signed)
  Subjective:    Patient ID: Kristopher Gay, male    DOB: 1951/10/26, 61 y.o.   MRN: [id number]  HPI He is here for consult concerning continued difficulty with fatigue. He now thinks her fatigue is mainly related to the pain medications and a chronic back pain he is having. He also recently had a testosterone level which was in the normal range. He continues to have difficulty with erectile dysfunction. He has stopped taking his blood pressure medication and states her blood pressures have been  in the normal range.   Review of Systems     Objective:   Physical Exam Alert and in no distress otherwise not examined       Assessment & Plan:   1. ED (erectile dysfunction)    I discussed testosterone and erectile dysfunction with him as well as his blood pressure. We will continue to monitor his blood pressure. A sample of Levitra was given with instructions on proper use and possible side effects including back pain and headache. Also explained the fact that he might not need this on a regular basis. He will keep me informed.

## 2011-11-27 ENCOUNTER — Ambulatory Visit (INDEPENDENT_AMBULATORY_CARE_PROVIDER_SITE_OTHER): Payer: Medicare Other

## 2011-11-27 DIAGNOSIS — J309 Allergic rhinitis, unspecified: Secondary | ICD-10-CM

## 2011-12-04 ENCOUNTER — Ambulatory Visit (INDEPENDENT_AMBULATORY_CARE_PROVIDER_SITE_OTHER): Payer: Medicare Other

## 2011-12-04 DIAGNOSIS — J309 Allergic rhinitis, unspecified: Secondary | ICD-10-CM

## 2011-12-10 ENCOUNTER — Telehealth: Payer: Self-pay | Admitting: Internal Medicine

## 2011-12-10 NOTE — Telephone Encounter (Signed)
I have tried calling 4 times today and the phone has stayed busy

## 2011-12-10 NOTE — Telephone Encounter (Signed)
Med is up front

## 2011-12-10 NOTE — Telephone Encounter (Signed)
Give him a sample of Cialis 20 mg

## 2011-12-11 NOTE — Telephone Encounter (Signed)
LEFT MESSAGE THAT SAMPLES ARE UP FRONT

## 2011-12-12 ENCOUNTER — Encounter: Payer: Medicare Other | Attending: Neurosurgery

## 2011-12-12 ENCOUNTER — Ambulatory Visit (INDEPENDENT_AMBULATORY_CARE_PROVIDER_SITE_OTHER): Payer: Medicare Other

## 2011-12-12 DIAGNOSIS — M79609 Pain in unspecified limb: Secondary | ICD-10-CM | POA: Insufficient documentation

## 2011-12-12 DIAGNOSIS — M545 Low back pain, unspecified: Secondary | ICD-10-CM | POA: Insufficient documentation

## 2011-12-12 DIAGNOSIS — G894 Chronic pain syndrome: Secondary | ICD-10-CM

## 2011-12-12 DIAGNOSIS — J309 Allergic rhinitis, unspecified: Secondary | ICD-10-CM

## 2011-12-12 DIAGNOSIS — IMO0002 Reserved for concepts with insufficient information to code with codable children: Secondary | ICD-10-CM

## 2011-12-12 DIAGNOSIS — M47817 Spondylosis without myelopathy or radiculopathy, lumbosacral region: Secondary | ICD-10-CM | POA: Insufficient documentation

## 2011-12-12 DIAGNOSIS — M5137 Other intervertebral disc degeneration, lumbosacral region: Secondary | ICD-10-CM

## 2011-12-12 DIAGNOSIS — G5730 Lesion of lateral popliteal nerve, unspecified lower limb: Secondary | ICD-10-CM

## 2011-12-19 ENCOUNTER — Telehealth: Payer: Self-pay | Admitting: Family Medicine

## 2011-12-19 ENCOUNTER — Ambulatory Visit (INDEPENDENT_AMBULATORY_CARE_PROVIDER_SITE_OTHER): Payer: Medicare Other

## 2011-12-19 DIAGNOSIS — J309 Allergic rhinitis, unspecified: Secondary | ICD-10-CM

## 2011-12-19 NOTE — Telephone Encounter (Signed)
Called pt and left message we have sample of Viagra up front for him to try.  Pt had advised ok to leave message.

## 2011-12-19 NOTE — Telephone Encounter (Signed)
Pt called and said Levitra did not work, Cialis did some good.  He said you would call him in a rx.  Please let him know if you can strenghten meds or try something else. To [phone] and wants rx called to [facility]  [phone].  Pt said ok to leave message on voice mail.

## 2011-12-19 NOTE — Telephone Encounter (Signed)
Left message for pt to return my call so I can find out if he has tried Viagra. If he has not then I can leave him a sample, if he has tried then I will call in Cialis.

## 2011-12-19 NOTE — Telephone Encounter (Signed)
Give him a sample of Viagra if he has not tried it. If he has then call in cialis 20 mg  #6 with PRN refill

## 2011-12-26 ENCOUNTER — Telehealth: Payer: Self-pay | Admitting: Physical Medicine & Rehabilitation

## 2011-12-26 ENCOUNTER — Ambulatory Visit (INDEPENDENT_AMBULATORY_CARE_PROVIDER_SITE_OTHER): Payer: Medicare Other

## 2011-12-26 DIAGNOSIS — M545 Low back pain: Secondary | ICD-10-CM

## 2011-12-26 DIAGNOSIS — J309 Allergic rhinitis, unspecified: Secondary | ICD-10-CM

## 2011-12-26 NOTE — Telephone Encounter (Signed)
Please advise, thanks.

## 2011-12-26 NOTE — Telephone Encounter (Signed)
rx for TENS supplies written

## 2011-12-26 NOTE — Telephone Encounter (Signed)
Patient called stating [facility] script has expired for TENS unit supplies.

## 2011-12-29 ENCOUNTER — Telehealth: Payer: Self-pay | Admitting: *Deleted

## 2011-12-29 NOTE — Telephone Encounter (Signed)
Kristopher Gay returned my call and said for [place] to fax to [facility]. He said that they were supposed to fax [place] information about it last week.

## 2011-12-29 NOTE — Telephone Encounter (Signed)
Left voice mail message that we had his TENS unit supplies rx for him to come get, or we could mail or fax but he would have to let us know what he wanted [place] to do with it.

## 2012-01-02 ENCOUNTER — Ambulatory Visit (INDEPENDENT_AMBULATORY_CARE_PROVIDER_SITE_OTHER): Payer: Medicare Other

## 2012-01-02 DIAGNOSIS — J309 Allergic rhinitis, unspecified: Secondary | ICD-10-CM

## 2012-01-05 ENCOUNTER — Telehealth: Payer: Self-pay | Admitting: Internal Medicine

## 2012-01-05 MED ORDER — SILDENAFIL CITRATE 100 MG PO TABS: 100.0000 mg | ORAL_TABLET | Freq: Every day | ORAL | Status: AC | PRN

## 2012-01-05 NOTE — Telephone Encounter (Signed)
Viagra is working the best. This will be called in.

## 2012-01-09 ENCOUNTER — Ambulatory Visit (INDEPENDENT_AMBULATORY_CARE_PROVIDER_SITE_OTHER): Payer: Medicare Other

## 2012-01-09 DIAGNOSIS — J309 Allergic rhinitis, unspecified: Secondary | ICD-10-CM

## 2012-01-13 ENCOUNTER — Encounter: Payer: Medicare Other | Attending: Neurosurgery

## 2012-01-13 ENCOUNTER — Encounter: Payer: Self-pay | Admitting: Physical Medicine & Rehabilitation

## 2012-01-13 ENCOUNTER — Ambulatory Visit (HOSPITAL_BASED_OUTPATIENT_CLINIC_OR_DEPARTMENT_OTHER): Payer: Medicare Other | Admitting: Physical Medicine & Rehabilitation

## 2012-01-13 VITALS — BP 149/85 | HR 76 | Resp 16 | Ht 75.0 in | Wt 268.6 lb

## 2012-01-13 DIAGNOSIS — G5730 Lesion of lateral popliteal nerve, unspecified lower limb: Secondary | ICD-10-CM

## 2012-01-13 DIAGNOSIS — G609 Hereditary and idiopathic neuropathy, unspecified: Secondary | ICD-10-CM | POA: Insufficient documentation

## 2012-01-13 DIAGNOSIS — M216X9 Other acquired deformities of unspecified foot: Secondary | ICD-10-CM | POA: Insufficient documentation

## 2012-01-13 DIAGNOSIS — M47817 Spondylosis without myelopathy or radiculopathy, lumbosacral region: Secondary | ICD-10-CM

## 2012-01-13 DIAGNOSIS — IMO0002 Reserved for concepts with insufficient information to code with codable children: Secondary | ICD-10-CM | POA: Insufficient documentation

## 2012-01-13 MED ORDER — MORPHINE SULFATE ER 20 MG PO CP24: 20.0000 mg | ORAL_CAPSULE | Freq: Every day | ORAL | Status: DC

## 2012-01-13 NOTE — Progress Notes (Signed)
Subjective:    Patient ID: Kristopher Gay, male    DOB: 13-May-1951, 61 y.o.   MRN: [id number]  HPI HISTORY: A 61 year old male with chronic low back pain as well as lower  extremity pain. He has history of peroneal neuropathy. On the left, he  has had medial branch block, bilateral L5 dorsal ramus, bilateral L4 and  bilateral L3 medial branches under fluoroscopic guidance on July 17, 2011. He has had good relief in that he no longer has a sharp pain  that really stops him in his tracks anymore. He is quite pleased about  this. Sometimes his pain did radiate down to his knee and radiating  right knee pain, is no longer present as well.  Pain Inventory Average Pain 6 Pain Right Now 8 My pain is constant, sharp, burning, dull, stabbing, tingling and aching  In the last 24 hours, has pain interfered with the following? General activity 9 Relation with others 9 Enjoyment of life 9 What TIME of day is your pain at its worst? night Sleep (in general) Poor  Pain is worse with: walking, bending, sitting, inactivity, standing and some activites Pain improves with: rest, therapy/exercise, pacing activities, medication and TENS Relief from Meds: 5  Mobility use a cane how many minutes can you walk? 5 ability to climb steps?  no do you drive?  yes  Function disabled: date disabled 2008 I need assistance with the following:  household duties  Neuro/Psych weakness numbness tremor tingling trouble walking spasms dizziness confusion depression anxiety suicidal thoughts States he does not have a plan but is something he thinks about from time to time  Prior Studies Any changes since last visit?  no  Physicians involved in your care Primary care Dr Drinda Butts  Neurologist Dr Thad Ranger Neurosurgeon Dr Alveda Reasons Pulmonologist Dr Fannie Knee      Review of Systems  Constitutional: Positive for chills, diaphoresis and unexpected weight change.       Night sweats and weight  gain  Respiratory: Positive for cough, shortness of breath and wheezing.        Respiratory infections  Cardiovascular: Positive for leg swelling.  Gastrointestinal: Positive for nausea, abdominal pain and constipation.  Musculoskeletal: Positive for back pain.       Pain low back and neck radiates to legs and hands. Foot drop left   Neurological: Positive for dizziness, tremors, weakness, numbness and headaches.  Psychiatric/Behavioral: Positive for suicidal ideas and dysphoric mood. The patient is nervous/anxious.        Denies plan with suicidal thoughts  All other systems reviewed and are negative.       Objective:   Physical Exam  Constitutional: [name], place, and time. He appears well-developed and well-nourished.  Musculoskeletal:       Lumbar back: He exhibits decreased range of motion and pain. He exhibits no tenderness and no edema.  Neurological: He is alert and oriented to person, place, and time. He has normal strength. A sensory deficit is present.  Reflex Scores:      Tricep reflexes are 1+ on the right side and 1+ on the left side.      Bicep reflexes are 1+ on the right side and 1+ on the left side.      Brachioradialis reflexes are 1+ on the right side and 1+ on the left side.      Patellar reflexes are 0 on the right side and 0 on the left side.  Achilles reflexes are 0 on the right side and 0 on the left side.      Decreased sensation to pinprick in all fingers except the left index Strength is good with the exception of left ankle dorsiflexor rated as 3 minus/5  Psychiatric: [name] His behavior is normal. Judgment and thought content normal.       Assessment & Plan:  1. Lumbar spondylosis with chronic low back pain. Has responded well to lumbar medial branch blocks. Continues to have relief of the very sharp grabbing pain. Continues to have his constant pain. Will continue the Kadian 20 mg per day. In the past he  has tried higher doses but this has resulted in more side effects.  2. Left fibular neuropathy as well as a more diffuse polyneuropathy.

## 2012-01-13 NOTE — Patient Instructions (Signed)
Facet Block A facet block is an injection procedure used to numb nerves near a spinal joint (facet). The injection usually includes a medicine like Novacaine (anesthetic) and a steroid medicine (similar to cortisone). The injections are made directly into the facet joint of the back. They are used for patients with several types of neck or back pain problems (such as worsening arthritis or persistent pain after surgery) that have not been helped with anti-inflammatory medications, exercise programs, physical therapy, and other forms of pain management. Multiple injections may be needed depending on how many joints are involved.  A facet block procedure can be helpful with diagnosis as well as providing therapeutic pain relief. One of three things may happen after the procedure:  The pain does not go away. This can mean that the pain is probably not coming from blocked facet joints. This information is helpful with diagnosis.   The pain goes away and stays away for a few hours but the original pain comes back and does not get better again. This information is also helpful with diagnosis. It can mean that pain is probably coming from the joints; but the steroid was not helpful for longer term pain control.   The pain goes away after the block, then returns later that day, and then gets better again over the next few days. This can mean that the block was helpful for pain control and the steroid had a longer lasting effect.  If there is good, lasting benefit from the injections, the block may be repeated from 3 to 5 times. If there is good relief but it is only of short-term benefit, other procedures (such as radiofrequency lesioning) may be considered.  Note: The procedure cannot be performed if you have an active infection, a lesion on or near the area of injection, flu, cold, fever, very high blood pressure or if you are on blood thinners. Please make your doctor aware of any of these conditions. This is  for your safety!  LET YOUR CAREGIVER KNOW ABOUT:   Allergies.   Medications taken including herbs, eye drops, over the counter medications, and creams   Use of steroids (by mouth or creams).   Possible pregnancy, if applicable.   Previous problems with anesthetics or Novocaine.   History of blood clots.   History of bleeding or blood problems.   Previous surgery, particularly of the neck and/or back   Other health problems.  RISKS AND COMPLICATIONS These are very uncommon but include:  Bleeding.   Injury to a nerve near the injection site.   Weakness or numbness in areas controlled by nerves near the injection site.   Infection.   Pain at the site of the injection.   Temporary fluid retention in those who are prone to this problem.   Allergic reaction to anesthetics or medicines used during the procedure.  Diabetics may have a temporary increase in their blood sugar after any surgical procedure, especially if steroids are used. Stinging/burning of the numbing medicine is the most uncomfortable part of the procedure; however every person's response to any procedure is individual.  BEFORE THE PROCEDURE   Your caregiver will provide instructions about stopping any medication before the procedure.   Unless advised otherwise, if the injections are in your neck, you may take your medications as usual with a sip of water but do not eat or drink for 6 hours before the procedure.   Unless advised otherwise, you may eat, drink and take your medications as   usual on the day of the procedure (both before and after) if the injections are to be in your lower back.   There is no other specific preparation necessary unless advised otherwise.  PROCEDURE After checking your blood pressure, the procedure will be done in the x-ray (fluoroscopy) room while lying on your stomach. For procedures in the neck, an intravenous line is usually started. The back is then cleansed with an antiseptic  soap. Sterile drapes are placed in this area. The skin is numbed with a local anesthetic. This is felt as a stinging or burning sensation. Using x-ray guidance, needles are then advanced to the appropriate locations. Once the needles are in the proper location, the anesthetic and steroid is injected through the needles and the needles are removed. The skin is then cleansed and bandages are applied. Blood pressure will be checked again, and you will be discharged to leave with your ride after your caregiver says it is okay to go.  AFTER THE PROCEDURE  You may not drive for the remainder of the day after your procedure. An adult must be present to drive you home or to go with you in a taxi or on public transportation. The procedure will be canceled if you do not have a responsible adult with you! This is for your safety.  HOME CARE INSTRUCTIONS   The bandages noted above can be removed on the morning after the procedure.   Resume medications according to your caregiver's instructions.   No heat is to be used near or over the injected area(s) for the remainder of the day.   No tub bath or soaking in water (such as a pool, jacuzzi, etc.) for the remainder of the day.   Some local tenderness may be experienced for a couple of days after the injection. Using an ice pack three or four times a day will help this.   Keep track of the amount of pain relief as well as how long the pain relief lasted.  SEEK MEDICAL CARE IF:   There is drainage from the injection site.   Pain is not controlled with medications prescribed.   There is significant bleeding or swelling.  SEEK IMMEDIATE MEDICAL CARE IF:   You develop a fever of 101 F (38.3 C) or greater.   Worsening pain, swelling, and/or red streaking develops in the skin around the injection site.   Severe pain develops and cannot be controlled with medications prescribed.   You develop any headache, stiff neck, nausea, vomiting, or your eyes become  very sensitive to light.   Weakness or paralysis develops in arms or legs not present before the procedure.   You develop difficulty urinating or difficulty breathing.  Document Released: 03/04/2007 Document Revised: 10/02/2011 Document Reviewed: 02/22/2009 [employer] Patient Information 2012 [employer]. 

## 2012-01-15 ENCOUNTER — Other Ambulatory Visit: Payer: Self-pay | Admitting: Internal Medicine

## 2012-01-16 ENCOUNTER — Ambulatory Visit (INDEPENDENT_AMBULATORY_CARE_PROVIDER_SITE_OTHER): Payer: Medicare Other

## 2012-01-16 DIAGNOSIS — J309 Allergic rhinitis, unspecified: Secondary | ICD-10-CM

## 2012-01-22 ENCOUNTER — Ambulatory Visit (INDEPENDENT_AMBULATORY_CARE_PROVIDER_SITE_OTHER): Payer: Medicare Other

## 2012-01-22 DIAGNOSIS — J309 Allergic rhinitis, unspecified: Secondary | ICD-10-CM

## 2012-01-26 ENCOUNTER — Encounter: Payer: Self-pay | Admitting: Internal Medicine

## 2012-01-30 ENCOUNTER — Ambulatory Visit (INDEPENDENT_AMBULATORY_CARE_PROVIDER_SITE_OTHER): Payer: Medicare Other

## 2012-01-30 DIAGNOSIS — J309 Allergic rhinitis, unspecified: Secondary | ICD-10-CM

## 2012-02-05 ENCOUNTER — Ambulatory Visit (INDEPENDENT_AMBULATORY_CARE_PROVIDER_SITE_OTHER): Payer: Medicare Other

## 2012-02-05 DIAGNOSIS — J309 Allergic rhinitis, unspecified: Secondary | ICD-10-CM

## 2012-02-12 ENCOUNTER — Ambulatory Visit (INDEPENDENT_AMBULATORY_CARE_PROVIDER_SITE_OTHER): Payer: Medicare Other

## 2012-02-12 ENCOUNTER — Telehealth: Payer: Self-pay

## 2012-02-12 ENCOUNTER — Encounter: Payer: Medicare Other | Attending: Physical Medicine & Rehabilitation | Admitting: *Deleted

## 2012-02-12 VITALS — BP 137/77 | HR 73 | Resp 18 | Ht 75.0 in | Wt 272.0 lb

## 2012-02-12 DIAGNOSIS — G8929 Other chronic pain: Secondary | ICD-10-CM

## 2012-02-12 DIAGNOSIS — G609 Hereditary and idiopathic neuropathy, unspecified: Secondary | ICD-10-CM | POA: Insufficient documentation

## 2012-02-12 DIAGNOSIS — M47817 Spondylosis without myelopathy or radiculopathy, lumbosacral region: Secondary | ICD-10-CM | POA: Insufficient documentation

## 2012-02-12 DIAGNOSIS — R29818 Other symptoms and signs involving the nervous system: Secondary | ICD-10-CM

## 2012-02-12 DIAGNOSIS — J309 Allergic rhinitis, unspecified: Secondary | ICD-10-CM

## 2012-02-12 DIAGNOSIS — M549 Dorsalgia, unspecified: Secondary | ICD-10-CM

## 2012-02-12 NOTE — Telephone Encounter (Signed)
Lm for pt to call office to clarify if he has a suicidal plan, per [place].

## 2012-02-12 NOTE — Progress Notes (Signed)
  Subjective:    Patient ID: Kristopher Gay, male    DOB: 1951/02/05, 61 y.o.   MRN: [id number]  HPI Pain Inventory Average Pain 6 Pain Right Now 6 My pain is constant, sharp, burning, dull, stabbing, tingling and aching  In the last 24 hours, has pain interfered with the following? General activity 9 Relation with others 9 Enjoyment of life 9 What TIME of day is your pain at its worst? night Sleep (in general) Poor  Pain is worse with: walking, bending, sitting, inactivity, standing and some activites Pain improves with: rest, therapy/exercise, pacing activities, medication and TENS Relief from Meds: 5  Mobility use a cane how many minutes can you walk? 5 min ability to climb steps?  no do you drive?  yes  Function disabled: date disabled 02/02/07  Neuro/Psych weakness numbness tremor tingling trouble walking spasms dizziness confusion depression anxiety suicidal thoughts  Prior Studies x-rays nerve study  Physicians involved in your care Any changes since last visit?  no        Review of Systems  Constitutional: Positive for fever, chills and unexpected weight change.  Gastrointestinal: Positive for nausea and abdominal pain.  Neurological: Positive for dizziness, tremors, weakness and numbness.  Psychiatric/Behavioral: Positive for suicidal ideas and dysphoric mood. The patient is nervous/anxious.        Objective:   Physical Exam        Assessment & Plan:

## 2012-02-12 NOTE — Progress Notes (Signed)
Pt pain is about the same 5-6.  Pt advised to keep his medications in a safe secure location. Pt says there is nothing we can do for his suicidal thoughts.  "When I am dead you will know"  Offered help.  Pt refused at this time.

## 2012-02-13 ENCOUNTER — Telehealth: Payer: Self-pay

## 2012-02-13 NOTE — Telephone Encounter (Signed)
Pt does not have a suicidal plan, he just goes through mood swings.

## 2012-02-13 NOTE — Telephone Encounter (Signed)
Called pt to find out if he has a suicidal plan.  Waiting for call back.

## 2012-02-16 ENCOUNTER — Other Ambulatory Visit: Payer: Self-pay | Admitting: *Deleted

## 2012-02-16 MED ORDER — GABAPENTIN 300 MG PO CAPS: 300.0000 mg | ORAL_CAPSULE | Freq: Three times a day (TID) | ORAL | Status: DC

## 2012-02-27 ENCOUNTER — Ambulatory Visit (INDEPENDENT_AMBULATORY_CARE_PROVIDER_SITE_OTHER): Payer: Medicare Other

## 2012-02-27 DIAGNOSIS — J309 Allergic rhinitis, unspecified: Secondary | ICD-10-CM

## 2012-03-01 ENCOUNTER — Ambulatory Visit (INDEPENDENT_AMBULATORY_CARE_PROVIDER_SITE_OTHER): Payer: Medicare Other

## 2012-03-01 DIAGNOSIS — J309 Allergic rhinitis, unspecified: Secondary | ICD-10-CM

## 2012-03-04 ENCOUNTER — Ambulatory Visit (INDEPENDENT_AMBULATORY_CARE_PROVIDER_SITE_OTHER): Payer: Medicare Other

## 2012-03-04 DIAGNOSIS — J309 Allergic rhinitis, unspecified: Secondary | ICD-10-CM

## 2012-03-12 ENCOUNTER — Ambulatory Visit (INDEPENDENT_AMBULATORY_CARE_PROVIDER_SITE_OTHER): Payer: Medicare Other

## 2012-03-12 ENCOUNTER — Encounter: Payer: Medicare Other | Attending: Neurosurgery

## 2012-03-12 ENCOUNTER — Ambulatory Visit (HOSPITAL_BASED_OUTPATIENT_CLINIC_OR_DEPARTMENT_OTHER): Payer: Medicare Other | Admitting: Physical Medicine & Rehabilitation

## 2012-03-12 ENCOUNTER — Encounter: Payer: Self-pay | Admitting: Physical Medicine & Rehabilitation

## 2012-03-12 VITALS — BP 160/89 | HR 74 | Resp 16 | Ht 75.0 in | Wt 281.0 lb

## 2012-03-12 DIAGNOSIS — M216X9 Other acquired deformities of unspecified foot: Secondary | ICD-10-CM | POA: Insufficient documentation

## 2012-03-12 DIAGNOSIS — G5732 Lesion of lateral popliteal nerve, left lower limb: Secondary | ICD-10-CM

## 2012-03-12 DIAGNOSIS — G5730 Lesion of lateral popliteal nerve, unspecified lower limb: Secondary | ICD-10-CM

## 2012-03-12 DIAGNOSIS — M47817 Spondylosis without myelopathy or radiculopathy, lumbosacral region: Secondary | ICD-10-CM

## 2012-03-12 DIAGNOSIS — IMO0002 Reserved for concepts with insufficient information to code with codable children: Secondary | ICD-10-CM | POA: Insufficient documentation

## 2012-03-12 DIAGNOSIS — J309 Allergic rhinitis, unspecified: Secondary | ICD-10-CM

## 2012-03-12 DIAGNOSIS — G609 Hereditary and idiopathic neuropathy, unspecified: Secondary | ICD-10-CM | POA: Insufficient documentation

## 2012-03-12 DIAGNOSIS — S8410XA Injury of peroneal nerve at lower leg level, unspecified leg, initial encounter: Secondary | ICD-10-CM | POA: Insufficient documentation

## 2012-03-12 MED ORDER — MORPHINE SULFATE ER 20 MG PO CP24: 20.0000 mg | ORAL_CAPSULE | Freq: Every day | ORAL | Status: DC

## 2012-03-12 NOTE — Patient Instructions (Signed)
Please walk as much as possible stay out of bed. PA visit next month Continue current medications, do not drink alcohol with this medicine

## 2012-03-12 NOTE — Progress Notes (Signed)
  Subjective:    Patient ID: Kristopher Gay, male    DOB: 09/05/1951, 61 y.o.   MRN: [id number]  HPI Gaining weight , not exercising Pain Inventory Average Pain 6 Pain Right Now 6 My pain is constant, sharp, burning, dull, stabbing, tingling and aching  In the last 24 hours, has pain interfered with the following? General activity 9 Relation with others 9 Enjoyment of life 9 What TIME of day is your pain at its worst? night Sleep (in general) Poor  Pain is worse with: walking, bending, sitting, inactivity, standing and some activites Pain improves with: rest, pacing activities, medication and TENS Relief from Meds: 5  Mobility use a cane ability to climb steps?  no do you drive?  yes  Function retired I need assistance with the following:  dressing and household duties  Neuro/Psych weakness numbness tremor tingling trouble walking spasms dizziness confusion depression anxiety suicidal thoughts  Prior Studies Any changes since last visit?  no  Physicians involved in your care Any changes since last visit?  no  Review of Systems  Constitutional: Positive for chills, fatigue and unexpected weight change.  HENT: Negative.   Eyes: Negative.   Respiratory: Positive for cough, shortness of breath and wheezing.   Cardiovascular: Negative.   Gastrointestinal: Positive for nausea, abdominal pain and constipation.  Genitourinary: Negative.   Musculoskeletal: Positive for gait problem.  Neurological: Positive for dizziness, tremors, weakness and numbness.  Psychiatric/Behavioral: Positive for suicidal ideas.       Objective:   Physical Exam  Constitutional: [name], place, and time. He appears well-developed and well-nourished.  Musculoskeletal:       Lumbar back: He exhibits decreased range of motion.       Limited external rotation  Reduced L ankle DF strength otherwise normal  Neurological: He is alert and oriented to person, place, and  time.  Psychiatric: [name]          Assessment & Plan:  1.  Lumbar spondylosis without myelopathy can reinject it back pain gets worse again. Patient will PA next visit. Go over some exercise to encourage weight loss

## 2012-03-19 ENCOUNTER — Encounter: Payer: Self-pay | Admitting: Internal Medicine

## 2012-03-19 ENCOUNTER — Ambulatory Visit (INDEPENDENT_AMBULATORY_CARE_PROVIDER_SITE_OTHER): Payer: Medicare Other | Admitting: Internal Medicine

## 2012-03-19 ENCOUNTER — Ambulatory Visit (INDEPENDENT_AMBULATORY_CARE_PROVIDER_SITE_OTHER): Payer: Medicare Other

## 2012-03-19 VITALS — BP 140/90 | HR 78 | Ht 75.0 in | Wt 283.0 lb

## 2012-03-19 DIAGNOSIS — J309 Allergic rhinitis, unspecified: Secondary | ICD-10-CM

## 2012-03-19 DIAGNOSIS — J45909 Unspecified asthma, uncomplicated: Secondary | ICD-10-CM

## 2012-03-19 DIAGNOSIS — K219 Gastro-esophageal reflux disease without esophagitis: Secondary | ICD-10-CM

## 2012-03-19 DIAGNOSIS — J301 Allergic rhinitis due to pollen: Secondary | ICD-10-CM

## 2012-03-19 DIAGNOSIS — J45998 Other asthma: Secondary | ICD-10-CM

## 2012-03-19 NOTE — Patient Instructions (Signed)
Sample Dymista nasal spray    2 sprays each nostril once daily at bedtime.   This would be instead of your flonase or patanase sprays for now.  Consider trying an otc acid blocker like omeprazole twice daily before breakfast and supper, daily for one month. See what aggressive acid control does for your hoarseness, even though you can't feel reflux.

## 2012-03-19 NOTE — Progress Notes (Signed)
Patient ID: Kristopher Gay, male    DOB: 1951-04-14, 61 y.o.   MRN: [id number]  HPI 61 yo never smoker followed for allergic rhinitis and allergic asthma, complicated by chronic musculoskeletal pain after an injury. Last here August 16, 2010. That note reviewed. He got through the winter ok, still limited by neurologic complaints. He reports a change in cough, deeper with some tightness over last 4 months. No change in perennial sinus drainage and occasional hoarseness. He continues allergy vaccine here at 1:10. 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 He continues Advair 250, reporting no difference with Advair 500 or with Dulera. Nasal sprays seem to work.  Needs refill tessalon, epipen and flonase.   09/19/11-59 yo never smoker followed for allergic rhinitis and allergic asthma, complicated by chronic musculoskeletal pain after an injury. He has noted cough and wheeze more often with cool weather, and some shortness of breath which may be worse. Nothing dramatic. Singing triggers chest tightness. Cold air makes his nose run. Pain management has told him his substernal pain is related to his airways although I have felt it was more likely musculoskeletal. He has been using Advair 250 but wants to try going back to Advair 500 which we discussed.  03/19/12- 36 yo never smoker followed for allergic rhinitis and allergic asthma, complicated by chronic musculoskeletal pain after an injury. Coughing-productive-gray in color; still on vaccine He blames pain in his legs today increased blood pressure. Continues allergy vaccine "not a bad spring". 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 He chose to stay on Advair 500. We discussed the effect of a dry powder steroid inhaler on vocal quality. Rarely needs a rescue inhaler. We discussed trying Dulera again with a spacer to see if that would help his throat.  Review of Systems-See HPI Constitutional:   No-   weight loss, night  sweats, fevers, chills, fatigue, lassitude. HEENT:   No-  headaches, difficulty swallowing, tooth/dental problems, sore throat,       + sneezing, itching, ear ache, nasal congestion, +post nasal drip,  CV:  No-   chest pain, orthopnea, PND, swelling in lower extremities, anasarca,  dizziness, palpitations Resp: +  shortness of breath with exertion, not at rest.              No-   productive cough,  No non-productive cough,  No- coughing up of blood.              No-   change in color of mucus.  + wheezing.   Skin: No-   rash or lesions. GI:  No-   heartburn, indigestion, abdominal pain, nausea, vomiting,  GU:  MS:  No- acute  joint pain or swelling.  + decreased range of motion.  + back pain. Neuro-     nothing unusual Psych:  No- change in mood or affect. No depression or anxiety.  No memory loss  Objective:   Physical Exam General- Alert, Oriented, Affect-appropriate, Distress- none acute Skin- rash-none, lesions- none, excoriation- none Lymphadenopathy- none Head- atraumatic            Eyes- Gross vision intact, PERRLA, conjunctivae clear secretions            Ears- Hearing, canals-normal            Nose- Clear, no-Septal dev, mucus, polyps, erosion, perforation             Throat- Mallampati II , mucosa cobblestoned , drainage-  none, tonsils- atrophic Neck- flexible , trachea midline, no stridor , thyroid nl, carotid no bruit Chest - symmetrical excursion , unlabored           Heart/CV- RRR , no murmur , no gallop  , no rub, nl s1 s2                           - JVD- none , edema- none, stasis changes- none, varices- none           Lung- clear to P&A, wheeze- none, cough- none , dullness-none, rub- none           Chest wall-  Abd-  Br/ Gen/ Rectal- Not done, not indicated Extrem- cyanosis- none, clubbing, none, atrophy- none, brace left lower leg/ cane. Neuro- grossly intact to observation

## 2012-03-25 DIAGNOSIS — K219 Gastro-esophageal reflux disease without esophagitis: Secondary | ICD-10-CM | POA: Insufficient documentation

## 2012-03-25 NOTE — Assessment & Plan Note (Signed)
Plan-he will continue Advair 500 but I have suggested a trial of to [facility] 100 with a spacer tube to see if that relieves his hoarseness

## 2012-03-25 NOTE — Assessment & Plan Note (Signed)
His pharynx looks cobblestoned suggestive of reflux irritation. Plan-recommend trial of twice daily acid blocker before meals

## 2012-03-25 NOTE — Assessment & Plan Note (Signed)
He is satisfied that allergy vaccine continues to help and says the spring was not bad. Plan-try sample Dymista

## 2012-03-26 ENCOUNTER — Ambulatory Visit (INDEPENDENT_AMBULATORY_CARE_PROVIDER_SITE_OTHER): Payer: Medicare Other

## 2012-03-26 DIAGNOSIS — J309 Allergic rhinitis, unspecified: Secondary | ICD-10-CM

## 2012-04-02 ENCOUNTER — Ambulatory Visit (INDEPENDENT_AMBULATORY_CARE_PROVIDER_SITE_OTHER): Payer: Medicare Other

## 2012-04-02 DIAGNOSIS — J309 Allergic rhinitis, unspecified: Secondary | ICD-10-CM

## 2012-04-09 ENCOUNTER — Ambulatory Visit (INDEPENDENT_AMBULATORY_CARE_PROVIDER_SITE_OTHER): Payer: Medicare Other

## 2012-04-09 DIAGNOSIS — J309 Allergic rhinitis, unspecified: Secondary | ICD-10-CM

## 2012-04-13 ENCOUNTER — Encounter: Payer: Medicare Other | Attending: Physical Medicine & Rehabilitation | Admitting: Physical Medicine and Rehabilitation

## 2012-04-13 ENCOUNTER — Encounter: Payer: Self-pay | Admitting: Physical Medicine and Rehabilitation

## 2012-04-13 VITALS — BP 143/86 | HR 79 | Resp 14 | Ht 75.0 in | Wt 281.0 lb

## 2012-04-13 DIAGNOSIS — R209 Unspecified disturbances of skin sensation: Secondary | ICD-10-CM | POA: Insufficient documentation

## 2012-04-13 DIAGNOSIS — M79609 Pain in unspecified limb: Secondary | ICD-10-CM | POA: Insufficient documentation

## 2012-04-13 DIAGNOSIS — G8929 Other chronic pain: Secondary | ICD-10-CM | POA: Insufficient documentation

## 2012-04-13 DIAGNOSIS — M25569 Pain in unspecified knee: Secondary | ICD-10-CM

## 2012-04-13 DIAGNOSIS — G5730 Lesion of lateral popliteal nerve, unspecified lower limb: Secondary | ICD-10-CM | POA: Insufficient documentation

## 2012-04-13 DIAGNOSIS — M545 Low back pain, unspecified: Secondary | ICD-10-CM

## 2012-04-13 DIAGNOSIS — M25561 Pain in right knee: Secondary | ICD-10-CM

## 2012-04-13 DIAGNOSIS — M47817 Spondylosis without myelopathy or radiculopathy, lumbosacral region: Secondary | ICD-10-CM | POA: Insufficient documentation

## 2012-04-13 DIAGNOSIS — R29898 Other symptoms and signs involving the musculoskeletal system: Secondary | ICD-10-CM | POA: Insufficient documentation

## 2012-04-13 MED ORDER — GABAPENTIN 300 MG PO CAPS: 300.0000 mg | ORAL_CAPSULE | Freq: Three times a day (TID) | ORAL | Status: DC

## 2012-04-13 MED ORDER — MORPHINE SULFATE ER 20 MG PO CP24: 20.0000 mg | ORAL_CAPSULE | Freq: Every day | ORAL | Status: DC

## 2012-04-13 NOTE — Patient Instructions (Signed)
Continue with exercising, continue with medication 
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
## 2012-04-16 ENCOUNTER — Ambulatory Visit (INDEPENDENT_AMBULATORY_CARE_PROVIDER_SITE_OTHER): Payer: Medicare Other

## 2012-04-16 DIAGNOSIS — J309 Allergic rhinitis, unspecified: Secondary | ICD-10-CM

## 2012-04-23 ENCOUNTER — Ambulatory Visit (INDEPENDENT_AMBULATORY_CARE_PROVIDER_SITE_OTHER): Payer: Medicare Other

## 2012-04-23 DIAGNOSIS — J309 Allergic rhinitis, unspecified: Secondary | ICD-10-CM

## 2012-04-30 ENCOUNTER — Ambulatory Visit (INDEPENDENT_AMBULATORY_CARE_PROVIDER_SITE_OTHER): Payer: Medicare Other

## 2012-04-30 DIAGNOSIS — J309 Allergic rhinitis, unspecified: Secondary | ICD-10-CM

## 2012-05-07 ENCOUNTER — Ambulatory Visit (INDEPENDENT_AMBULATORY_CARE_PROVIDER_SITE_OTHER): Payer: Medicare Other

## 2012-05-07 DIAGNOSIS — J309 Allergic rhinitis, unspecified: Secondary | ICD-10-CM

## 2012-05-12 ENCOUNTER — Encounter: Payer: Self-pay | Admitting: Internal Medicine

## 2012-05-14 ENCOUNTER — Ambulatory Visit (INDEPENDENT_AMBULATORY_CARE_PROVIDER_SITE_OTHER): Payer: Medicare Other

## 2012-05-14 ENCOUNTER — Encounter: Payer: Self-pay | Admitting: Physical Medicine and Rehabilitation

## 2012-05-14 ENCOUNTER — Encounter: Payer: Medicare Other | Attending: Physical Medicine & Rehabilitation | Admitting: Physical Medicine and Rehabilitation

## 2012-05-14 VITALS — BP 145/76 | HR 71 | Resp 14 | Ht 75.0 in | Wt 278.0 lb

## 2012-05-14 DIAGNOSIS — M25569 Pain in unspecified knee: Secondary | ICD-10-CM | POA: Insufficient documentation

## 2012-05-14 DIAGNOSIS — G5730 Lesion of lateral popliteal nerve, unspecified lower limb: Secondary | ICD-10-CM | POA: Insufficient documentation

## 2012-05-14 DIAGNOSIS — J309 Allergic rhinitis, unspecified: Secondary | ICD-10-CM

## 2012-05-14 DIAGNOSIS — M79609 Pain in unspecified limb: Secondary | ICD-10-CM | POA: Insufficient documentation

## 2012-05-14 DIAGNOSIS — M47816 Spondylosis without myelopathy or radiculopathy, lumbar region: Secondary | ICD-10-CM

## 2012-05-14 DIAGNOSIS — G8929 Other chronic pain: Secondary | ICD-10-CM

## 2012-05-14 DIAGNOSIS — M47817 Spondylosis without myelopathy or radiculopathy, lumbosacral region: Secondary | ICD-10-CM

## 2012-05-14 DIAGNOSIS — M545 Low back pain: Secondary | ICD-10-CM

## 2012-05-14 DIAGNOSIS — R29898 Other symptoms and signs involving the musculoskeletal system: Secondary | ICD-10-CM | POA: Insufficient documentation

## 2012-05-14 MED ORDER — MORPHINE SULFATE ER 20 MG PO CP24: 20.0000 mg | ORAL_CAPSULE | Freq: Every day | ORAL | Status: DC

## 2012-05-14 NOTE — Patient Instructions (Signed)
Continue with exercising, continue with trying loosing weight.
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
## 2012-05-20 ENCOUNTER — Ambulatory Visit (INDEPENDENT_AMBULATORY_CARE_PROVIDER_SITE_OTHER): Payer: Medicare Other

## 2012-05-20 DIAGNOSIS — J309 Allergic rhinitis, unspecified: Secondary | ICD-10-CM

## 2012-05-28 ENCOUNTER — Ambulatory Visit (INDEPENDENT_AMBULATORY_CARE_PROVIDER_SITE_OTHER): Payer: Medicare Other

## 2012-05-28 DIAGNOSIS — J309 Allergic rhinitis, unspecified: Secondary | ICD-10-CM

## 2012-06-04 ENCOUNTER — Ambulatory Visit (INDEPENDENT_AMBULATORY_CARE_PROVIDER_SITE_OTHER): Payer: Medicare Other

## 2012-06-04 DIAGNOSIS — J309 Allergic rhinitis, unspecified: Secondary | ICD-10-CM

## 2012-06-11 ENCOUNTER — Ambulatory Visit (INDEPENDENT_AMBULATORY_CARE_PROVIDER_SITE_OTHER): Payer: Medicare Other

## 2012-06-11 ENCOUNTER — Encounter: Payer: Self-pay | Admitting: Physical Medicine and Rehabilitation

## 2012-06-11 ENCOUNTER — Encounter
Payer: Medicare Other | Attending: Physical Medicine and Rehabilitation | Admitting: Physical Medicine and Rehabilitation

## 2012-06-11 ENCOUNTER — Other Ambulatory Visit: Payer: Self-pay | Admitting: Physical Medicine and Rehabilitation

## 2012-06-11 VITALS — BP 142/75 | HR 73 | Resp 14 | Ht 73.0 in | Wt 278.0 lb

## 2012-06-11 DIAGNOSIS — M47812 Spondylosis without myelopathy or radiculopathy, cervical region: Secondary | ICD-10-CM | POA: Insufficient documentation

## 2012-06-11 DIAGNOSIS — M545 Low back pain, unspecified: Secondary | ICD-10-CM

## 2012-06-11 DIAGNOSIS — M25569 Pain in unspecified knee: Secondary | ICD-10-CM | POA: Insufficient documentation

## 2012-06-11 DIAGNOSIS — J309 Allergic rhinitis, unspecified: Secondary | ICD-10-CM

## 2012-06-11 DIAGNOSIS — G8929 Other chronic pain: Secondary | ICD-10-CM | POA: Insufficient documentation

## 2012-06-11 DIAGNOSIS — G5730 Lesion of lateral popliteal nerve, unspecified lower limb: Secondary | ICD-10-CM | POA: Insufficient documentation

## 2012-06-11 DIAGNOSIS — M549 Dorsalgia, unspecified: Secondary | ICD-10-CM

## 2012-06-11 DIAGNOSIS — M542 Cervicalgia: Secondary | ICD-10-CM

## 2012-06-11 MED ORDER — MORPHINE SULFATE ER 20 MG PO CP24: 20.0000 mg | ORAL_CAPSULE | Freq: Every day | ORAL | Status: DC
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
## 2012-06-11 NOTE — Patient Instructions (Signed)
Continue with your exercises. 

## 2012-06-12 LAB — OPIATES/OPIOIDS (LC/MS-MS)

## 2012-06-18 ENCOUNTER — Ambulatory Visit (INDEPENDENT_AMBULATORY_CARE_PROVIDER_SITE_OTHER): Payer: Medicare Other

## 2012-06-18 DIAGNOSIS — J309 Allergic rhinitis, unspecified: Secondary | ICD-10-CM

## 2012-06-25 ENCOUNTER — Ambulatory Visit (INDEPENDENT_AMBULATORY_CARE_PROVIDER_SITE_OTHER): Payer: Medicare Other

## 2012-06-25 DIAGNOSIS — J309 Allergic rhinitis, unspecified: Secondary | ICD-10-CM

## 2012-07-01 ENCOUNTER — Ambulatory Visit (INDEPENDENT_AMBULATORY_CARE_PROVIDER_SITE_OTHER): Payer: Medicare Other

## 2012-07-01 DIAGNOSIS — J309 Allergic rhinitis, unspecified: Secondary | ICD-10-CM

## 2012-07-02 ENCOUNTER — Ambulatory Visit (INDEPENDENT_AMBULATORY_CARE_PROVIDER_SITE_OTHER): Payer: Medicare Other | Admitting: Internal Medicine

## 2012-07-02 ENCOUNTER — Ambulatory Visit: Payer: Medicare Other | Admitting: Family Medicine

## 2012-07-02 VITALS — BP 156/83 | HR 73 | Temp 98.0°F | Resp 16 | Ht 75.0 in | Wt 275.4 lb

## 2012-07-02 DIAGNOSIS — T7840XA Allergy, unspecified, initial encounter: Secondary | ICD-10-CM

## 2012-07-02 DIAGNOSIS — T63461A Toxic effect of venom of wasps, accidental (unintentional), initial encounter: Secondary | ICD-10-CM

## 2012-07-02 DIAGNOSIS — IMO0001 Reserved for inherently not codable concepts without codable children: Secondary | ICD-10-CM | POA: Insufficient documentation

## 2012-07-02 MED ORDER — METHYLPREDNISOLONE ACETATE 80 MG/ML IJ SUSP
80.0000 mg | Freq: Once | INTRAMUSCULAR | Status: AC
  Administered 2012-07-02: 80 mg via INTRAMUSCULAR

## 2012-07-02 MED ORDER — PREDNISONE 20 MG PO TABS: ORAL_TABLET | ORAL | Status: AC

## 2012-07-02 NOTE — Progress Notes (Signed)
Subjective: Got stung by a yellow jacket on his right temple yesterday. A little later he got stung by 1 or his right hand. It stung him again on his right hand this afternoon. Between last night and this morning his right forearm is swollen up considerably. He went to see Dr. Maple Hudson who treated him with this loratadine and Depo-Medrol. He is concerned because it is the sole of swallow is swollen and uncomfortable.  Objective very swollen right hand, puffy all the way around, to near the elbow. He also has swelling on the right temple and below his right eye. Chest clear. Heart regular.  Assessment: Yellow jacket stings with severe local reactions.  Plan: Decide going to prednisone taper in addition to the shot he got. He continues to do take the Benadryl as per Dr. Roxy Cedar directions.  Return or go to the ER if getting worse. Try to his questions.

## 2012-07-02 NOTE — Patient Instructions (Addendum)
Continue using the treatments that Dr. Maple Hudson began earlier today. Benadryl and icing.  We will give additional prednisone which he should take as directed on the label.  If you have a bee sting or yellowjacket sting start reacting very suddenly use your EpiPen and see a physician immediately.

## 2012-07-02 NOTE — Assessment & Plan Note (Signed)
Plan depo, allegra, cool compresses

## 2012-07-02 NOTE — Progress Notes (Signed)
   Patient ID: Kristopher Gay, male    DOB: February 05, 1951, 61 y.o.   MRN: [id number]  HPI 61 yo never smoker followed for allergic rhinitis and allergic asthma, complicated by chronic musculoskeletal pain after an injury. Last here August 16, 2010. That note reviewed. He got through the winter ok, still limited by neurologic complaints. He reports a change in cough, deeper with some tightness over last 4 months. No change in perennial sinus drainage and occasional hoarseness. He continues allergy vaccine here at 1:10. 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 He continues Advair 250, reporting no difference with Advair 500 or with Dulera. Nasal sprays seem to work.  Needs refill tessalon, epipen and flonase.   09/19/11-59 yo never smoker followed for allergic rhinitis and allergic asthma, complicated by chronic musculoskeletal pain after an injury. He has noted cough and wheeze more often with cool weather, and some shortness of breath which may be worse. Nothing dramatic. Singing triggers chest tightness. Cold air makes his nose run. Pain management has told him his substernal pain is related to his airways although I have felt it was more likely musculoskeletal. He has been using Advair 250 but wants to try going back to Advair 500 which we discussed.  03/19/12- 14 yo never smoker followed for allergic rhinitis and allergic asthma, complicated by chronic musculoskeletal pain after an injury. Coughing-productive-gray in color; still on vaccine He blames pain in his legs today increased blood pressure. Continues allergy vaccine "not a bad spring". 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 He chose to stay on Advair 500. We discussed the effect of a dry powder steroid inhaler on vocal quality. Rarely needs a rescue inhaler. We discussed trying Dulera again with a spacer to see if that would help his throat.  07/02/12-  6 yo never smoker followed for allergic rhinitis and allergic  asthma, complicated by chronic musculoskeletal pain after an injury. ACUTE visit- stung yesterday on right temple and back of right hand by yellow jackets. Today has tense warm edema across dorsum of right hand and smaller, similar area R temple. No systemic reaction. Plan- depomedrol, sample allegra, cool compresses.

## 2012-07-02 NOTE — Patient Instructions (Addendum)
Depo 80  Sample allegra  Cool compresses.

## 2012-07-05 ENCOUNTER — Ambulatory Visit (INDEPENDENT_AMBULATORY_CARE_PROVIDER_SITE_OTHER): Payer: Medicare Other

## 2012-07-05 DIAGNOSIS — J309 Allergic rhinitis, unspecified: Secondary | ICD-10-CM

## 2012-07-08 ENCOUNTER — Ambulatory Visit (INDEPENDENT_AMBULATORY_CARE_PROVIDER_SITE_OTHER): Payer: Medicare Other

## 2012-07-08 DIAGNOSIS — J309 Allergic rhinitis, unspecified: Secondary | ICD-10-CM

## 2012-07-11 ENCOUNTER — Encounter: Payer: Self-pay | Admitting: Internal Medicine

## 2012-07-16 ENCOUNTER — Ambulatory Visit (INDEPENDENT_AMBULATORY_CARE_PROVIDER_SITE_OTHER): Payer: Medicare Other

## 2012-07-16 ENCOUNTER — Encounter
Payer: Medicare Other | Attending: Physical Medicine and Rehabilitation | Admitting: Physical Medicine and Rehabilitation

## 2012-07-16 ENCOUNTER — Encounter: Payer: Self-pay | Admitting: Physical Medicine and Rehabilitation

## 2012-07-16 VITALS — BP 134/70 | HR 73 | Resp 14 | Ht 75.0 in | Wt 274.0 lb

## 2012-07-16 DIAGNOSIS — M47816 Spondylosis without myelopathy or radiculopathy, lumbar region: Secondary | ICD-10-CM

## 2012-07-16 DIAGNOSIS — G8929 Other chronic pain: Secondary | ICD-10-CM | POA: Insufficient documentation

## 2012-07-16 DIAGNOSIS — R209 Unspecified disturbances of skin sensation: Secondary | ICD-10-CM | POA: Insufficient documentation

## 2012-07-16 DIAGNOSIS — M25569 Pain in unspecified knee: Secondary | ICD-10-CM | POA: Insufficient documentation

## 2012-07-16 DIAGNOSIS — R29898 Other symptoms and signs involving the musculoskeletal system: Secondary | ICD-10-CM | POA: Insufficient documentation

## 2012-07-16 DIAGNOSIS — M47817 Spondylosis without myelopathy or radiculopathy, lumbosacral region: Secondary | ICD-10-CM | POA: Insufficient documentation

## 2012-07-16 DIAGNOSIS — J309 Allergic rhinitis, unspecified: Secondary | ICD-10-CM

## 2012-07-16 DIAGNOSIS — G5780 Other specified mononeuropathies of unspecified lower limb: Secondary | ICD-10-CM | POA: Insufficient documentation

## 2012-07-16 DIAGNOSIS — M545 Low back pain, unspecified: Secondary | ICD-10-CM | POA: Insufficient documentation

## 2012-07-16 MED ORDER — GABAPENTIN 300 MG PO CAPS: 300.0000 mg | ORAL_CAPSULE | Freq: Three times a day (TID) | ORAL | Status: DC

## 2012-07-16 MED ORDER — MORPHINE SULFATE ER 20 MG PO CP24: 20.0000 mg | ORAL_CAPSULE | Freq: Every day | ORAL | Status: DC

## 2012-07-16 NOTE — Patient Instructions (Signed)
Continue with staying active and working out .
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
## 2012-07-23 ENCOUNTER — Ambulatory Visit (INDEPENDENT_AMBULATORY_CARE_PROVIDER_SITE_OTHER): Payer: Medicare Other

## 2012-07-23 DIAGNOSIS — J309 Allergic rhinitis, unspecified: Secondary | ICD-10-CM

## 2012-07-30 ENCOUNTER — Ambulatory Visit (INDEPENDENT_AMBULATORY_CARE_PROVIDER_SITE_OTHER): Payer: Medicare Other

## 2012-07-30 DIAGNOSIS — J309 Allergic rhinitis, unspecified: Secondary | ICD-10-CM

## 2012-08-02 ENCOUNTER — Encounter: Payer: Self-pay | Admitting: Internal Medicine

## 2012-08-06 ENCOUNTER — Ambulatory Visit (INDEPENDENT_AMBULATORY_CARE_PROVIDER_SITE_OTHER): Payer: Medicare Other

## 2012-08-06 DIAGNOSIS — J309 Allergic rhinitis, unspecified: Secondary | ICD-10-CM

## 2012-08-13 ENCOUNTER — Ambulatory Visit (INDEPENDENT_AMBULATORY_CARE_PROVIDER_SITE_OTHER): Payer: Medicare Other

## 2012-08-13 ENCOUNTER — Encounter
Payer: Medicare Other | Attending: Physical Medicine and Rehabilitation | Admitting: Physical Medicine and Rehabilitation

## 2012-08-13 ENCOUNTER — Encounter: Payer: Self-pay | Admitting: Physical Medicine and Rehabilitation

## 2012-08-13 VITALS — BP 145/83 | HR 66 | Resp 14 | Ht 75.0 in | Wt 278.0 lb

## 2012-08-13 DIAGNOSIS — M545 Low back pain, unspecified: Secondary | ICD-10-CM | POA: Insufficient documentation

## 2012-08-13 DIAGNOSIS — R209 Unspecified disturbances of skin sensation: Secondary | ICD-10-CM | POA: Insufficient documentation

## 2012-08-13 DIAGNOSIS — M47817 Spondylosis without myelopathy or radiculopathy, lumbosacral region: Secondary | ICD-10-CM

## 2012-08-13 DIAGNOSIS — M25569 Pain in unspecified knee: Secondary | ICD-10-CM | POA: Insufficient documentation

## 2012-08-13 DIAGNOSIS — M79609 Pain in unspecified limb: Secondary | ICD-10-CM | POA: Insufficient documentation

## 2012-08-13 DIAGNOSIS — G5730 Lesion of lateral popliteal nerve, unspecified lower limb: Secondary | ICD-10-CM | POA: Insufficient documentation

## 2012-08-13 DIAGNOSIS — J309 Allergic rhinitis, unspecified: Secondary | ICD-10-CM

## 2012-08-13 DIAGNOSIS — R29898 Other symptoms and signs involving the musculoskeletal system: Secondary | ICD-10-CM | POA: Insufficient documentation

## 2012-08-13 DIAGNOSIS — G8929 Other chronic pain: Secondary | ICD-10-CM | POA: Insufficient documentation

## 2012-08-13 MED ORDER — MORPHINE SULFATE ER 20 MG PO CP24: 20.0000 mg | ORAL_CAPSULE | Freq: Every day | ORAL | Status: DC
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
## 2012-08-13 NOTE — Patient Instructions (Signed)
Try to stay as active as possible , continue with your exercises. 

## 2012-08-19 ENCOUNTER — Other Ambulatory Visit: Payer: Self-pay | Admitting: *Deleted

## 2012-08-19 MED ORDER — FLUTICASONE-SALMETEROL 500-50 MCG/DOSE IN AEPB: 1.0000 | INHALATION_SPRAY | Freq: Two times a day (BID) | RESPIRATORY_TRACT | Status: DC

## 2012-08-20 ENCOUNTER — Ambulatory Visit (INDEPENDENT_AMBULATORY_CARE_PROVIDER_SITE_OTHER): Payer: Medicare Other

## 2012-08-20 DIAGNOSIS — J309 Allergic rhinitis, unspecified: Secondary | ICD-10-CM

## 2012-08-26 ENCOUNTER — Ambulatory Visit (INDEPENDENT_AMBULATORY_CARE_PROVIDER_SITE_OTHER): Payer: Medicare Other

## 2012-08-26 DIAGNOSIS — J309 Allergic rhinitis, unspecified: Secondary | ICD-10-CM

## 2012-09-03 ENCOUNTER — Ambulatory Visit (INDEPENDENT_AMBULATORY_CARE_PROVIDER_SITE_OTHER): Payer: Medicare Other

## 2012-09-03 DIAGNOSIS — J309 Allergic rhinitis, unspecified: Secondary | ICD-10-CM

## 2012-09-09 ENCOUNTER — Ambulatory Visit (INDEPENDENT_AMBULATORY_CARE_PROVIDER_SITE_OTHER): Payer: Medicare Other

## 2012-09-09 DIAGNOSIS — J309 Allergic rhinitis, unspecified: Secondary | ICD-10-CM

## 2012-09-13 ENCOUNTER — Encounter
Payer: Medicare Other | Attending: Physical Medicine and Rehabilitation | Admitting: Physical Medicine and Rehabilitation

## 2012-09-13 ENCOUNTER — Encounter: Payer: Self-pay | Admitting: Physical Medicine and Rehabilitation

## 2012-09-13 VITALS — BP 157/89 | HR 70 | Resp 14 | Ht 75.0 in | Wt 280.4 lb

## 2012-09-13 DIAGNOSIS — M25569 Pain in unspecified knee: Secondary | ICD-10-CM | POA: Insufficient documentation

## 2012-09-13 DIAGNOSIS — M47817 Spondylosis without myelopathy or radiculopathy, lumbosacral region: Secondary | ICD-10-CM | POA: Insufficient documentation

## 2012-09-13 DIAGNOSIS — M545 Low back pain, unspecified: Secondary | ICD-10-CM | POA: Insufficient documentation

## 2012-09-13 DIAGNOSIS — G8929 Other chronic pain: Secondary | ICD-10-CM | POA: Insufficient documentation

## 2012-09-13 DIAGNOSIS — G5780 Other specified mononeuropathies of unspecified lower limb: Secondary | ICD-10-CM | POA: Insufficient documentation

## 2012-09-13 DIAGNOSIS — R209 Unspecified disturbances of skin sensation: Secondary | ICD-10-CM | POA: Insufficient documentation

## 2012-09-13 MED ORDER — MELOXICAM 15 MG PO TABS: 15.0000 mg | ORAL_TABLET | Freq: Every day | ORAL | Status: DC

## 2012-09-13 MED ORDER — MORPHINE SULFATE ER 20 MG PO CP24: 20.0000 mg | ORAL_CAPSULE | Freq: Every day | ORAL | Status: DC

## 2012-09-13 NOTE — Patient Instructions (Signed)
Try to stay as active as tolerated 
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
## 2012-09-17 ENCOUNTER — Ambulatory Visit (INDEPENDENT_AMBULATORY_CARE_PROVIDER_SITE_OTHER): Payer: Medicare Other

## 2012-09-17 DIAGNOSIS — J309 Allergic rhinitis, unspecified: Secondary | ICD-10-CM

## 2012-09-22 ENCOUNTER — Other Ambulatory Visit: Payer: Self-pay

## 2012-09-22 MED ORDER — GABAPENTIN 300 MG PO CAPS: 300.0000 mg | ORAL_CAPSULE | Freq: Three times a day (TID) | ORAL | Status: DC

## 2012-09-24 ENCOUNTER — Ambulatory Visit (INDEPENDENT_AMBULATORY_CARE_PROVIDER_SITE_OTHER): Payer: Medicare Other | Admitting: Internal Medicine

## 2012-09-24 ENCOUNTER — Encounter: Payer: Self-pay | Admitting: Internal Medicine

## 2012-09-24 ENCOUNTER — Other Ambulatory Visit: Payer: Medicare Other

## 2012-09-24 ENCOUNTER — Ambulatory Visit (INDEPENDENT_AMBULATORY_CARE_PROVIDER_SITE_OTHER)
Admission: RE | Admit: 2012-09-24 | Discharge: 2012-09-24 | Disposition: A | Discharge status: Home / Self Care | Payer: Medicare Other | Source: Ambulatory Visit | Attending: Internal Medicine | Admitting: Internal Medicine

## 2012-09-24 ENCOUNTER — Ambulatory Visit: Payer: Medicare Other | Admitting: Internal Medicine

## 2012-09-24 VITALS — BP 126/78 | HR 67 | Ht 75.0 in | Wt 283.0 lb

## 2012-09-24 DIAGNOSIS — J45998 Other asthma: Secondary | ICD-10-CM

## 2012-09-24 DIAGNOSIS — IMO0001 Reserved for inherently not codable concepts without codable children: Secondary | ICD-10-CM

## 2012-09-24 DIAGNOSIS — T63481A Toxic effect of venom of other arthropod, accidental (unintentional), initial encounter: Secondary | ICD-10-CM

## 2012-09-24 DIAGNOSIS — J45909 Unspecified asthma, uncomplicated: Secondary | ICD-10-CM

## 2012-09-24 DIAGNOSIS — Z23 Encounter for immunization: Secondary | ICD-10-CM

## 2012-09-24 DIAGNOSIS — Z91038 Other insect allergy status: Secondary | ICD-10-CM

## 2012-09-24 DIAGNOSIS — T6391XA Toxic effect of contact with unspecified venomous animal, accidental (unintentional), initial encounter: Secondary | ICD-10-CM

## 2012-09-24 DIAGNOSIS — J301 Allergic rhinitis due to pollen: Secondary | ICD-10-CM

## 2012-09-24 MED ORDER — BENZONATATE 100 MG PO CAPS: 100.0000 mg | ORAL_CAPSULE | Freq: Four times a day (QID) | ORAL | Status: AC | PRN

## 2012-09-24 MED ORDER — ALBUTEROL SULFATE HFA 108 (90 BASE) MCG/ACT IN AERS: 2.0000 | INHALATION_SPRAY | RESPIRATORY_TRACT | Status: DC | PRN

## 2012-09-24 MED ORDER — EPINEPHRINE 0.3 MG/0.3ML IJ DEVI: 0.3000 mg | Freq: Once | INTRAMUSCULAR | Status: AC

## 2012-09-24 MED ORDER — FLUTICASONE-SALMETEROL 500-50 MCG/DOSE IN AEPB: 1.0000 | INHALATION_SPRAY | Freq: Two times a day (BID) | RESPIRATORY_TRACT | Status: DC

## 2012-09-24 MED ORDER — FLUTICASONE PROPIONATE 50 MCG/ACT NA SUSP: 2.0000 | Freq: Every day | NASAL | Status: DC

## 2012-09-24 MED ORDER — OLOPATADINE HCL 0.6 % NA SOLN: 1.0000 | Freq: Two times a day (BID) | NASAL | Status: DC | PRN

## 2012-09-24 NOTE — Progress Notes (Signed)
Patient ID: Kristopher Gay, male    DOB: 1950/12/01, 61 y.o.   MRN: [id number]  HPI 61 yo never smoker followed for allergic rhinitis and allergic asthma, complicated by chronic musculoskeletal pain after an injury. Last here August 16, 2010. That note reviewed. He got through the winter ok, still limited by neurologic complaints. He reports a change in cough, deeper with some tightness over last 4 months. No change in perennial sinus drainage and occasional hoarseness. He continues allergy vaccine here at 1:10. 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 He continues Advair 250, reporting no difference with Advair 500 or with Dulera. Nasal sprays seem to work.  Needs refill tessalon, epipen and flonase.   09/19/11-59 yo never smoker followed for allergic rhinitis and allergic asthma, complicated by chronic musculoskeletal pain after an injury. He has noted cough and wheeze more often with cool weather, and some shortness of breath which may be worse. Nothing dramatic. Singing triggers chest tightness. Cold air makes his nose run. Pain management has told him his substernal pain is related to his airways although I have felt it was more likely musculoskeletal. He has been using Advair 250 but wants to try going back to Advair 500 which we discussed.  03/19/12- 89 yo never smoker followed for allergic rhinitis and allergic asthma, complicated by chronic musculoskeletal pain after an injury. Coughing-productive-gray in color; still on vaccine He blames pain in his legs today increased blood pressure. Continues allergy vaccine "not a bad spring". 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 He chose to stay on Advair 500. We discussed the effect of a dry powder steroid inhaler on vocal quality. Rarely needs a rescue inhaler. We discussed trying Dulera again with a spacer to see if that would help his throat.  07/02/12-  72 yo never smoker followed for allergic rhinitis and allergic  asthma, complicated by chronic musculoskeletal pain after an injury. ACUTE visit- stung yesterday on right temple and back of right hand by yellow jackets. Today has tense warm edema across dorsum of right hand and smaller, similar area R temple. No systemic reaction. Plan- depomedrol, sample allegra, cool compresses.   09/24/12-   79 yo never smoker followed for allergic rhinitis and allergic asthma, complicated by chronic musculoskeletal pain after an injury. FOLLOWS FOR: states that he has been having "coughing attacks" for about 3 months. He vomits when he coughs too much. He describes hard coughing paroxysms, wet or dry cough, scant clear mucus. Not clear if he refluxes first but sometimes coughing leads to retching. Also not clear what triggers this cough. He complains of a sense of persistent mild chest congestion at rest and says breathing is "an effort" with occasional wheeze noted. He continues to be followed at pain management where he says oxygen saturation sometimes gets into the 80s there. He preferred Advair after trying Dulera and Symbicort. He describes history of what may have been systemic reaction to multiple yellow jacket stings in the past. Details unclear.  ROS-see HPI Constitutional:   No-   weight loss, night sweats, fevers, chills, fatigue, lassitude. HEENT:   No-  headaches, difficulty swallowing, tooth/dental problems, sore throat,       No-  sneezing, itching, ear ache, nasal congestion, post nasal drip,  CV:  No-   chest pain, orthopnea, PND, swelling in lower extremities, anasarca,  dizziness, palpitations Resp: No-   shortness of breath with exertion or at rest.  No-   productive cough,  No non-productive cough,  No- coughing up of blood.              No-   change in color of mucus.  No- wheezing.   Skin: No-   rash or lesions. GI:  No-   heartburn, indigestion, abdominal pain, nausea, vomiting,  GU: . MS: +  joint pain or swelling.  No- decreased  range of motion.  + back pain. Neuro-     nothing unusual Psych:  No- change in mood or affect. + depression or anxiety.  No memory loss.   OBJ- Physical Exam General- Alert, Oriented, Affect-appropriate, Distress- none acute Skin- rash-none, lesions- none, excoriation- none Lymphadenopathy- none Head- atraumatic            Eyes- Gross vision intact, PERRLA, conjunctivae and secretions clear            Ears- Hearing, canals-normal            Nose- Clear, no-Septal dev, mucus, polyps, erosion, perforation             Throat- Mallampati II , mucosa clear , drainage- none, tonsils- atrophic Neck- flexible , trachea midline, no stridor , thyroid nl, carotid no bruit Chest - symmetrical excursion , unlabored           Heart/CV- RRR , no murmur , no gallop  , no rub, nl s1 s2                           - JVD- none , edema- none, stasis changes- none, varices- none           Lung- clear to P&A, wheeze- none, cough+ once , dullness-none, rub- none           Chest wall-  Abd-  Br/ Gen/ Rectal- Not done, not indicated Extrem- cyanosis- none, clubbing, none, atrophy- none, strength- muscular. Brace on left leg Neuro- grossly intact to observation

## 2012-09-24 NOTE — Patient Instructions (Addendum)
Scripts refilled  We can continue allergy vaccine  Flu vax  Order CXR dx Asthma with bronchitis  Order lab- stinging insect IgE allergy panel

## 2012-09-28 ENCOUNTER — Other Ambulatory Visit: Payer: Self-pay | Admitting: Internal Medicine

## 2012-09-28 DIAGNOSIS — I272 Pulmonary hypertension, unspecified: Secondary | ICD-10-CM

## 2012-09-28 LAB — ALLERGEN HYMENOPTERA PANEL
Honey Bee IgE: 0.56 kU/L — ABNORMAL HIGH
Paper Wasp IgE: 3.21 kU/L — ABNORMAL HIGH
White Hornet IgE: 3.68 kU/L — ABNORMAL HIGH
Yellow Hornet IgE: 1.23 kU/L — ABNORMAL HIGH
Yellow Jacket IgE: 4.26 kU/L — ABNORMAL HIGH

## 2012-09-28 NOTE — Progress Notes (Signed)
Quick Note:  Pt aware of results and willing to have 2D Echo. Order placed. ______

## 2012-09-29 ENCOUNTER — Ambulatory Visit (HOSPITAL_COMMUNITY): Payer: Medicare Other | Attending: Internal Medicine | Admitting: Radiology

## 2012-09-29 DIAGNOSIS — I272 Pulmonary hypertension, unspecified: Secondary | ICD-10-CM

## 2012-09-29 DIAGNOSIS — J40 Bronchitis, not specified as acute or chronic: Secondary | ICD-10-CM | POA: Insufficient documentation

## 2012-09-29 DIAGNOSIS — I369 Nonrheumatic tricuspid valve disorder, unspecified: Secondary | ICD-10-CM

## 2012-09-29 DIAGNOSIS — E669 Obesity, unspecified: Secondary | ICD-10-CM | POA: Insufficient documentation

## 2012-09-29 DIAGNOSIS — R9389 Abnormal findings on diagnostic imaging of other specified body structures: Secondary | ICD-10-CM | POA: Insufficient documentation

## 2012-09-29 DIAGNOSIS — I517 Cardiomegaly: Secondary | ICD-10-CM | POA: Insufficient documentation

## 2012-09-29 DIAGNOSIS — J45909 Unspecified asthma, uncomplicated: Secondary | ICD-10-CM | POA: Insufficient documentation

## 2012-09-29 DIAGNOSIS — I079 Rheumatic tricuspid valve disease, unspecified: Secondary | ICD-10-CM | POA: Insufficient documentation

## 2012-09-29 NOTE — Progress Notes (Signed)
Echocardiogram performed.  

## 2012-09-30 ENCOUNTER — Ambulatory Visit (INDEPENDENT_AMBULATORY_CARE_PROVIDER_SITE_OTHER): Payer: Medicare Other

## 2012-09-30 DIAGNOSIS — J309 Allergic rhinitis, unspecified: Secondary | ICD-10-CM

## 2012-10-01 NOTE — Progress Notes (Signed)
Quick Note:  Pt aware of results. ______ 

## 2012-10-06 ENCOUNTER — Encounter
Payer: Medicare Other | Attending: Physical Medicine and Rehabilitation | Admitting: Physical Medicine and Rehabilitation

## 2012-10-06 ENCOUNTER — Encounter: Payer: Self-pay | Admitting: Physical Medicine and Rehabilitation

## 2012-10-06 DIAGNOSIS — G8929 Other chronic pain: Secondary | ICD-10-CM | POA: Insufficient documentation

## 2012-10-06 DIAGNOSIS — M545 Low back pain, unspecified: Secondary | ICD-10-CM | POA: Insufficient documentation

## 2012-10-06 DIAGNOSIS — M47817 Spondylosis without myelopathy or radiculopathy, lumbosacral region: Secondary | ICD-10-CM | POA: Insufficient documentation

## 2012-10-06 DIAGNOSIS — M79609 Pain in unspecified limb: Secondary | ICD-10-CM | POA: Insufficient documentation

## 2012-10-06 DIAGNOSIS — M25569 Pain in unspecified knee: Secondary | ICD-10-CM | POA: Insufficient documentation

## 2012-10-06 DIAGNOSIS — G5730 Lesion of lateral popliteal nerve, unspecified lower limb: Secondary | ICD-10-CM | POA: Insufficient documentation

## 2012-10-06 MED ORDER — MORPHINE SULFATE ER 20 MG PO CP24: 20.0000 mg | ORAL_CAPSULE | Freq: Every day | ORAL | Status: DC

## 2012-10-06 MED ORDER — MELOXICAM 15 MG PO TABS: 15.0000 mg | ORAL_TABLET | Freq: Every day | ORAL | Status: DC

## 2012-10-06 NOTE — Assessment & Plan Note (Signed)
I'm not sure if cough represents cough equivalent asthma or reflux-induced cough. We have reviewed reflux precautions. Plan-chest x-ray, refill Advair and rescue inhaler as discussed.

## 2012-10-06 NOTE — Assessment & Plan Note (Signed)
Plan-discussed avoidance. Discussed systemic versus local reactions. Venom allergy profile

## 2012-10-06 NOTE — Patient Instructions (Signed)
Continue with staying as active as tolerated 

## 2012-10-06 NOTE — Assessment & Plan Note (Signed)
He continues allergy vaccine with discussion. Flu vaccine, refill meds including EpiPen
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
## 2012-10-08 ENCOUNTER — Ambulatory Visit (INDEPENDENT_AMBULATORY_CARE_PROVIDER_SITE_OTHER): Payer: Medicare Other

## 2012-10-08 DIAGNOSIS — J309 Allergic rhinitis, unspecified: Secondary | ICD-10-CM

## 2012-10-14 ENCOUNTER — Ambulatory Visit (INDEPENDENT_AMBULATORY_CARE_PROVIDER_SITE_OTHER): Payer: Medicare Other

## 2012-10-14 DIAGNOSIS — J309 Allergic rhinitis, unspecified: Secondary | ICD-10-CM

## 2012-10-21 ENCOUNTER — Ambulatory Visit (INDEPENDENT_AMBULATORY_CARE_PROVIDER_SITE_OTHER): Payer: Medicare Other

## 2012-10-21 ENCOUNTER — Other Ambulatory Visit: Payer: Self-pay

## 2012-10-21 DIAGNOSIS — J309 Allergic rhinitis, unspecified: Secondary | ICD-10-CM

## 2012-10-21 MED ORDER — MELOXICAM 15 MG PO TABS: 15.0000 mg | ORAL_TABLET | Freq: Every day | ORAL | Status: DC

## 2012-10-29 ENCOUNTER — Ambulatory Visit (INDEPENDENT_AMBULATORY_CARE_PROVIDER_SITE_OTHER): Payer: Medicare Other

## 2012-10-29 DIAGNOSIS — J309 Allergic rhinitis, unspecified: Secondary | ICD-10-CM

## 2012-11-04 ENCOUNTER — Ambulatory Visit (INDEPENDENT_AMBULATORY_CARE_PROVIDER_SITE_OTHER): Payer: Medicare Other

## 2012-11-04 DIAGNOSIS — J309 Allergic rhinitis, unspecified: Secondary | ICD-10-CM

## 2012-11-05 ENCOUNTER — Ambulatory Visit (INDEPENDENT_AMBULATORY_CARE_PROVIDER_SITE_OTHER): Payer: Medicare Other

## 2012-11-05 DIAGNOSIS — J309 Allergic rhinitis, unspecified: Secondary | ICD-10-CM

## 2012-11-11 ENCOUNTER — Encounter: Payer: Self-pay | Admitting: Physical Medicine and Rehabilitation

## 2012-11-11 ENCOUNTER — Encounter
Payer: Medicare Other | Attending: Physical Medicine and Rehabilitation | Admitting: Physical Medicine and Rehabilitation

## 2012-11-11 ENCOUNTER — Ambulatory Visit (INDEPENDENT_AMBULATORY_CARE_PROVIDER_SITE_OTHER): Payer: Medicare Other

## 2012-11-11 VITALS — BP 155/91 | HR 71 | Resp 14 | Ht 73.0 in | Wt 289.0 lb

## 2012-11-11 DIAGNOSIS — M545 Low back pain, unspecified: Secondary | ICD-10-CM | POA: Insufficient documentation

## 2012-11-11 DIAGNOSIS — G5730 Lesion of lateral popliteal nerve, unspecified lower limb: Secondary | ICD-10-CM | POA: Insufficient documentation

## 2012-11-11 DIAGNOSIS — M549 Dorsalgia, unspecified: Secondary | ICD-10-CM

## 2012-11-11 DIAGNOSIS — G8929 Other chronic pain: Secondary | ICD-10-CM | POA: Insufficient documentation

## 2012-11-11 DIAGNOSIS — J309 Allergic rhinitis, unspecified: Secondary | ICD-10-CM

## 2012-11-11 DIAGNOSIS — G609 Hereditary and idiopathic neuropathy, unspecified: Secondary | ICD-10-CM | POA: Insufficient documentation

## 2012-11-11 DIAGNOSIS — Z5181 Encounter for therapeutic drug level monitoring: Secondary | ICD-10-CM

## 2012-11-11 DIAGNOSIS — M47817 Spondylosis without myelopathy or radiculopathy, lumbosacral region: Secondary | ICD-10-CM | POA: Insufficient documentation

## 2012-11-11 MED ORDER — MORPHINE SULFATE ER 20 MG PO CP24: 20.0000 mg | ORAL_CAPSULE | Freq: Every day | ORAL | Status: DC
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
## 2012-11-11 NOTE — Patient Instructions (Signed)
Continue with staying as active as pain allows

## 2012-11-18 ENCOUNTER — Ambulatory Visit (INDEPENDENT_AMBULATORY_CARE_PROVIDER_SITE_OTHER): Payer: Medicare Other

## 2012-11-18 DIAGNOSIS — J309 Allergic rhinitis, unspecified: Secondary | ICD-10-CM

## 2012-11-19 ENCOUNTER — Ambulatory Visit: Payer: Medicare Other

## 2012-11-25 ENCOUNTER — Ambulatory Visit: Payer: Medicare Other

## 2012-11-26 ENCOUNTER — Ambulatory Visit (INDEPENDENT_AMBULATORY_CARE_PROVIDER_SITE_OTHER): Payer: Medicare Other

## 2012-11-26 DIAGNOSIS — J309 Allergic rhinitis, unspecified: Secondary | ICD-10-CM

## 2012-12-03 ENCOUNTER — Ambulatory Visit (INDEPENDENT_AMBULATORY_CARE_PROVIDER_SITE_OTHER): Payer: Medicare Other

## 2012-12-03 DIAGNOSIS — J309 Allergic rhinitis, unspecified: Secondary | ICD-10-CM

## 2012-12-09 ENCOUNTER — Ambulatory Visit: Payer: Medicare Other

## 2012-12-17 ENCOUNTER — Ambulatory Visit (INDEPENDENT_AMBULATORY_CARE_PROVIDER_SITE_OTHER): Payer: Medicare Other

## 2012-12-17 ENCOUNTER — Encounter
Payer: Medicare Other | Attending: Physical Medicine and Rehabilitation | Admitting: Physical Medicine and Rehabilitation

## 2012-12-17 ENCOUNTER — Encounter: Payer: Self-pay | Admitting: Physical Medicine and Rehabilitation

## 2012-12-17 VITALS — BP 143/71 | HR 70 | Resp 14 | Ht 73.0 in | Wt 287.0 lb

## 2012-12-17 DIAGNOSIS — G8929 Other chronic pain: Secondary | ICD-10-CM

## 2012-12-17 DIAGNOSIS — M25569 Pain in unspecified knee: Secondary | ICD-10-CM | POA: Insufficient documentation

## 2012-12-17 DIAGNOSIS — G5730 Lesion of lateral popliteal nerve, unspecified lower limb: Secondary | ICD-10-CM | POA: Insufficient documentation

## 2012-12-17 DIAGNOSIS — M47817 Spondylosis without myelopathy or radiculopathy, lumbosacral region: Secondary | ICD-10-CM

## 2012-12-17 DIAGNOSIS — M549 Dorsalgia, unspecified: Secondary | ICD-10-CM

## 2012-12-17 DIAGNOSIS — M545 Low back pain, unspecified: Secondary | ICD-10-CM | POA: Insufficient documentation

## 2012-12-17 DIAGNOSIS — J309 Allergic rhinitis, unspecified: Secondary | ICD-10-CM

## 2012-12-17 MED ORDER — MORPHINE SULFATE ER 20 MG PO CP24: 20.0000 mg | ORAL_CAPSULE | Freq: Every day | ORAL | Status: DC

## 2012-12-17 NOTE — Patient Instructions (Signed)
Continue with staying as active as tolerated 
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
## 2012-12-24 ENCOUNTER — Ambulatory Visit (INDEPENDENT_AMBULATORY_CARE_PROVIDER_SITE_OTHER): Payer: Medicare Other

## 2012-12-24 ENCOUNTER — Other Ambulatory Visit: Payer: Self-pay

## 2012-12-24 DIAGNOSIS — J309 Allergic rhinitis, unspecified: Secondary | ICD-10-CM

## 2012-12-24 MED ORDER — GABAPENTIN 300 MG PO CAPS: 300.0000 mg | ORAL_CAPSULE | Freq: Three times a day (TID) | ORAL | Status: DC

## 2012-12-30 ENCOUNTER — Ambulatory Visit (INDEPENDENT_AMBULATORY_CARE_PROVIDER_SITE_OTHER): Payer: Medicare Other

## 2012-12-30 DIAGNOSIS — J309 Allergic rhinitis, unspecified: Secondary | ICD-10-CM

## 2013-01-06 ENCOUNTER — Ambulatory Visit (INDEPENDENT_AMBULATORY_CARE_PROVIDER_SITE_OTHER): Payer: Medicare Other

## 2013-01-06 DIAGNOSIS — J309 Allergic rhinitis, unspecified: Secondary | ICD-10-CM

## 2013-01-11 ENCOUNTER — Ambulatory Visit: Payer: Self-pay

## 2013-01-13 ENCOUNTER — Encounter: Payer: Self-pay | Admitting: Physical Medicine and Rehabilitation

## 2013-01-13 ENCOUNTER — Ambulatory Visit (INDEPENDENT_AMBULATORY_CARE_PROVIDER_SITE_OTHER): Payer: Medicare Other

## 2013-01-13 ENCOUNTER — Encounter
Payer: Medicare Other | Attending: Physical Medicine and Rehabilitation | Admitting: Physical Medicine and Rehabilitation

## 2013-01-13 VITALS — BP 155/84 | HR 79 | Resp 14 | Ht 73.0 in | Wt 285.4 lb

## 2013-01-13 DIAGNOSIS — G5730 Lesion of lateral popliteal nerve, unspecified lower limb: Secondary | ICD-10-CM | POA: Insufficient documentation

## 2013-01-13 DIAGNOSIS — M545 Low back pain, unspecified: Secondary | ICD-10-CM | POA: Insufficient documentation

## 2013-01-13 DIAGNOSIS — X58XXXA Exposure to other specified factors, initial encounter: Secondary | ICD-10-CM | POA: Insufficient documentation

## 2013-01-13 DIAGNOSIS — M25569 Pain in unspecified knee: Secondary | ICD-10-CM | POA: Insufficient documentation

## 2013-01-13 DIAGNOSIS — M47817 Spondylosis without myelopathy or radiculopathy, lumbosacral region: Secondary | ICD-10-CM | POA: Insufficient documentation

## 2013-01-13 DIAGNOSIS — R209 Unspecified disturbances of skin sensation: Secondary | ICD-10-CM | POA: Insufficient documentation

## 2013-01-13 DIAGNOSIS — R29898 Other symptoms and signs involving the musculoskeletal system: Secondary | ICD-10-CM | POA: Insufficient documentation

## 2013-01-13 DIAGNOSIS — M259 Joint disorder, unspecified: Secondary | ICD-10-CM | POA: Insufficient documentation

## 2013-01-13 DIAGNOSIS — J309 Allergic rhinitis, unspecified: Secondary | ICD-10-CM

## 2013-01-13 DIAGNOSIS — S93409A Sprain of unspecified ligament of unspecified ankle, initial encounter: Secondary | ICD-10-CM | POA: Insufficient documentation

## 2013-01-13 DIAGNOSIS — G8929 Other chronic pain: Secondary | ICD-10-CM | POA: Insufficient documentation

## 2013-01-13 MED ORDER — MORPHINE SULFATE ER 20 MG PO CP24: 20.0000 mg | ORAL_CAPSULE | Freq: Every day | ORAL | Status: DC

## 2013-01-13 NOTE — Patient Instructions (Signed)
Continue with your exercise and walking program 
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
## 2013-01-20 ENCOUNTER — Ambulatory Visit: Payer: Medicare Other

## 2013-01-21 ENCOUNTER — Ambulatory Visit (INDEPENDENT_AMBULATORY_CARE_PROVIDER_SITE_OTHER): Payer: Medicare Other

## 2013-01-21 DIAGNOSIS — J309 Allergic rhinitis, unspecified: Secondary | ICD-10-CM

## 2013-01-27 ENCOUNTER — Ambulatory Visit (INDEPENDENT_AMBULATORY_CARE_PROVIDER_SITE_OTHER): Payer: Medicare Other

## 2013-01-27 DIAGNOSIS — J309 Allergic rhinitis, unspecified: Secondary | ICD-10-CM

## 2013-02-03 ENCOUNTER — Ambulatory Visit (INDEPENDENT_AMBULATORY_CARE_PROVIDER_SITE_OTHER): Payer: Medicare Other

## 2013-02-03 DIAGNOSIS — J309 Allergic rhinitis, unspecified: Secondary | ICD-10-CM

## 2013-02-04 ENCOUNTER — Telehealth: Payer: Self-pay

## 2013-02-04 NOTE — Telephone Encounter (Signed)
PT STATES HE HAVE BEEN SEEN ON A W/C AND WOULD LIKE TO COME BY AND PICK UP A COPY OF HIS XRAYS. PLEASE CALL [phone] IS HOPING HE CAN COME BY BY TUESDAY

## 2013-02-07 ENCOUNTER — Encounter: Payer: Self-pay | Admitting: Internal Medicine

## 2013-02-09 NOTE — Telephone Encounter (Signed)
Pt is calling back about getting copy of his x-rays. He called Friday and he states that he still hasn't heard anything back. Call back [phone]

## 2013-02-09 NOTE — Telephone Encounter (Signed)
Request given to xray 

## 2013-02-10 ENCOUNTER — Ambulatory Visit (INDEPENDENT_AMBULATORY_CARE_PROVIDER_SITE_OTHER): Payer: Medicare Other

## 2013-02-10 ENCOUNTER — Encounter: Payer: Self-pay | Admitting: Physical Medicine and Rehabilitation

## 2013-02-10 ENCOUNTER — Encounter
Payer: Medicare Other | Attending: Physical Medicine and Rehabilitation | Admitting: Physical Medicine and Rehabilitation

## 2013-02-10 VITALS — BP 129/85 | HR 63 | Resp 14 | Ht 73.0 in | Wt 281.0 lb

## 2013-02-10 DIAGNOSIS — M545 Low back pain, unspecified: Secondary | ICD-10-CM | POA: Insufficient documentation

## 2013-02-10 DIAGNOSIS — G8929 Other chronic pain: Secondary | ICD-10-CM | POA: Insufficient documentation

## 2013-02-10 DIAGNOSIS — M47817 Spondylosis without myelopathy or radiculopathy, lumbosacral region: Secondary | ICD-10-CM | POA: Insufficient documentation

## 2013-02-10 DIAGNOSIS — M549 Dorsalgia, unspecified: Secondary | ICD-10-CM

## 2013-02-10 DIAGNOSIS — J309 Allergic rhinitis, unspecified: Secondary | ICD-10-CM

## 2013-02-10 DIAGNOSIS — G5730 Lesion of lateral popliteal nerve, unspecified lower limb: Secondary | ICD-10-CM | POA: Insufficient documentation

## 2013-02-10 DIAGNOSIS — G609 Hereditary and idiopathic neuropathy, unspecified: Secondary | ICD-10-CM | POA: Insufficient documentation

## 2013-02-10 MED ORDER — MORPHINE SULFATE ER 20 MG PO CP24: 20.0000 mg | ORAL_CAPSULE | Freq: Every day | ORAL | Status: DC
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
## 2013-02-10 NOTE — Patient Instructions (Signed)
Continue with your exercise program 

## 2013-02-17 ENCOUNTER — Ambulatory Visit (INDEPENDENT_AMBULATORY_CARE_PROVIDER_SITE_OTHER): Payer: Medicare Other

## 2013-02-17 DIAGNOSIS — J309 Allergic rhinitis, unspecified: Secondary | ICD-10-CM

## 2013-02-24 ENCOUNTER — Ambulatory Visit: Payer: Medicare Other

## 2013-02-25 ENCOUNTER — Ambulatory Visit (INDEPENDENT_AMBULATORY_CARE_PROVIDER_SITE_OTHER): Payer: Medicare Other

## 2013-02-25 DIAGNOSIS — J309 Allergic rhinitis, unspecified: Secondary | ICD-10-CM

## 2013-03-03 ENCOUNTER — Ambulatory Visit (INDEPENDENT_AMBULATORY_CARE_PROVIDER_SITE_OTHER): Payer: Medicare Other

## 2013-03-03 DIAGNOSIS — J309 Allergic rhinitis, unspecified: Secondary | ICD-10-CM

## 2013-03-10 ENCOUNTER — Ambulatory Visit: Payer: Medicare Other

## 2013-03-11 ENCOUNTER — Encounter: Payer: Self-pay | Admitting: Physical Medicine and Rehabilitation

## 2013-03-11 ENCOUNTER — Encounter
Payer: Medicare Other | Attending: Physical Medicine and Rehabilitation | Admitting: Physical Medicine and Rehabilitation

## 2013-03-11 ENCOUNTER — Ambulatory Visit (INDEPENDENT_AMBULATORY_CARE_PROVIDER_SITE_OTHER): Payer: Medicare Other

## 2013-03-11 VITALS — BP 143/89 | HR 69 | Resp 14 | Ht 73.0 in | Wt 275.0 lb

## 2013-03-11 DIAGNOSIS — G5730 Lesion of lateral popliteal nerve, unspecified lower limb: Secondary | ICD-10-CM | POA: Insufficient documentation

## 2013-03-11 DIAGNOSIS — S93499A Sprain of other ligament of unspecified ankle, initial encounter: Secondary | ICD-10-CM | POA: Insufficient documentation

## 2013-03-11 DIAGNOSIS — M25569 Pain in unspecified knee: Secondary | ICD-10-CM | POA: Insufficient documentation

## 2013-03-11 DIAGNOSIS — J309 Allergic rhinitis, unspecified: Secondary | ICD-10-CM

## 2013-03-11 DIAGNOSIS — G8929 Other chronic pain: Secondary | ICD-10-CM | POA: Insufficient documentation

## 2013-03-11 DIAGNOSIS — G609 Hereditary and idiopathic neuropathy, unspecified: Secondary | ICD-10-CM | POA: Insufficient documentation

## 2013-03-11 DIAGNOSIS — X58XXXA Exposure to other specified factors, initial encounter: Secondary | ICD-10-CM | POA: Insufficient documentation

## 2013-03-11 DIAGNOSIS — M47817 Spondylosis without myelopathy or radiculopathy, lumbosacral region: Secondary | ICD-10-CM

## 2013-03-11 DIAGNOSIS — R55 Syncope and collapse: Secondary | ICD-10-CM | POA: Insufficient documentation

## 2013-03-11 DIAGNOSIS — M549 Dorsalgia, unspecified: Secondary | ICD-10-CM

## 2013-03-11 DIAGNOSIS — S96819A Strain of other specified muscles and tendons at ankle and foot level, unspecified foot, initial encounter: Secondary | ICD-10-CM | POA: Insufficient documentation

## 2013-03-11 MED ORDER — MORPHINE SULFATE ER 20 MG PO CP24: 20.0000 mg | ORAL_CAPSULE | Freq: Every day | ORAL | Status: DC

## 2013-03-11 NOTE — Patient Instructions (Signed)
Continue with your PT as pain allows.
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
## 2013-03-16 ENCOUNTER — Ambulatory Visit (INDEPENDENT_AMBULATORY_CARE_PROVIDER_SITE_OTHER): Payer: Medicare Other

## 2013-03-16 DIAGNOSIS — J309 Allergic rhinitis, unspecified: Secondary | ICD-10-CM

## 2013-03-18 ENCOUNTER — Ambulatory Visit (INDEPENDENT_AMBULATORY_CARE_PROVIDER_SITE_OTHER): Payer: Medicare Other

## 2013-03-18 DIAGNOSIS — J309 Allergic rhinitis, unspecified: Secondary | ICD-10-CM

## 2013-03-24 ENCOUNTER — Ambulatory Visit (INDEPENDENT_AMBULATORY_CARE_PROVIDER_SITE_OTHER): Payer: Medicare Other | Admitting: Internal Medicine

## 2013-03-24 ENCOUNTER — Encounter: Payer: Self-pay | Admitting: Internal Medicine

## 2013-03-24 ENCOUNTER — Ambulatory Visit (INDEPENDENT_AMBULATORY_CARE_PROVIDER_SITE_OTHER): Payer: Medicare Other

## 2013-03-24 VITALS — BP 124/78 | HR 67 | Ht 75.0 in | Wt 271.0 lb

## 2013-03-24 DIAGNOSIS — J309 Allergic rhinitis, unspecified: Secondary | ICD-10-CM

## 2013-03-24 DIAGNOSIS — R29818 Other symptoms and signs involving the nervous system: Secondary | ICD-10-CM

## 2013-03-24 DIAGNOSIS — J45998 Other asthma: Secondary | ICD-10-CM

## 2013-03-24 DIAGNOSIS — J301 Allergic rhinitis due to pollen: Secondary | ICD-10-CM

## 2013-03-24 DIAGNOSIS — J45909 Unspecified asthma, uncomplicated: Secondary | ICD-10-CM

## 2013-03-24 MED ORDER — FLUTICASONE FUROATE-VILANTEROL 100-25 MCG/INH IN AEPB: 1.0000 | INHALATION_SPRAY | Freq: Every day | RESPIRATORY_TRACT | Status: DC

## 2013-03-24 NOTE — Progress Notes (Signed)
Patient ID: Kristopher Gay, male    DOB: 09/18/1951, 62 y.o.   MRN: [id number]  HPI 62 yo never smoker followed for allergic rhinitis and allergic asthma, complicated by chronic musculoskeletal pain after an injury. Last here August 16, 2010. That note reviewed. He got through the winter ok, still limited by neurologic complaints. He reports a change in cough, deeper with some tightness over last 4 months. No change in perennial sinus drainage and occasional hoarseness. He continues allergy vaccine here at 1:10. 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 He continues Advair 250, reporting no difference with Advair 500 or with Dulera. Nasal sprays seem to work.  Needs refill tessalon, epipen and flonase.   09/19/11-62 yo never smoker followed for allergic rhinitis and allergic asthma, complicated by chronic musculoskeletal pain after an injury. He has noted cough and wheeze more often with cool weather, and some shortness of breath which may be worse. Nothing dramatic. Singing triggers chest tightness. Cold air makes his nose run. Pain management has told him his substernal pain is related to his airways although I have felt it was more likely musculoskeletal. He has been using Advair 250 but wants to try going back to Advair 500 which we discussed.  03/19/12- 62 yo never smoker followed for allergic rhinitis and allergic asthma, complicated by chronic musculoskeletal pain after an injury. Coughing-productive-gray in color; still on vaccine He blames pain in his legs today increased blood pressure. Continues allergy vaccine "not a bad spring". 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 He chose to stay on Advair 500. We discussed the effect of a dry powder steroid inhaler on vocal quality. Rarely needs a rescue inhaler. We discussed trying Dulera again with a spacer to see if that would help his throat.  07/02/12-  62 yo never smoker followed for allergic rhinitis and allergic  asthma, complicated by chronic musculoskeletal pain after an injury. ACUTE visit- stung yesterday on right temple and back of right hand by yellow jackets. Today has tense warm edema across dorsum of right hand and smaller, similar area R temple. No systemic reaction. Plan- depomedrol, sample allegra, cool compresses.   09/24/12-   62 yo never smoker followed for allergic rhinitis and allergic asthma, complicated by chronic musculoskeletal pain after an injury. FOLLOWS FOR: states that he has been having "coughing attacks" for about 3 months. He vomits when he coughs too much. He describes hard coughing paroxysms, wet or dry cough, scant clear mucus. Not clear if he refluxes first but sometimes coughing leads to retching. Also not clear what triggers this cough. He complains of a sense of persistent mild chest congestion at rest and says breathing is "an effort" with occasional wheeze noted. He continues to be followed at pain management where he says oxygen saturation sometimes gets into the 80s there. He preferred Advair after trying Dulera and Symbicort. He describes history of what may have been systemic reaction to multiple yellow jacket stings in the past. Details unclear.  03/24/13- 62 yo never smoker followed for allergic rhinitis and allergic asthma, complicated by chronic musculoskeletal pain after an injury. FOLLOWS FOR: pain in center chest area-doesn't feel full like congestion.   Still on allergy vaccine 1:10 GH and no reactions.  Blames spring pollen for some increase in his chronic sense of right upper anterior chest discomfort, noticed mostly while mowing his lawn with a self-propelled mower. Discomfort is increased with deep breath and he says it is  clearly related to his breathing, not the amount of physical effort. He continues chronic pain clinic followup, polyneuropathy. CXR 09/28/12 IMPRESSION:  1. Hyperexpanded lungs and chronic bronchitic change without  definite acute  cardiopulmonary disease.  2. Borderline enlarged cardiac silhouette with prominence of the  central pulmonary vasculature, nonspecific but may be seen in the  setting of pulmonary arterial hypertension. Further evaluation  with cardiac echo may be performed as clinically indicated.  Original Report Authenticated By: Tacey Ruiz, MD 2D Echo-09/29/12-EF 65-70, mild LVH, grade 1 diastolic dysfunction, mild bi-atrial dilatation without pulmonary hypertension.  ROS-see HPI Constitutional:   No-   weight loss, night sweats, fevers, chills, fatigue, lassitude. HEENT:   No-  headaches, difficulty swallowing, tooth/dental problems, sore throat,       No-  sneezing, itching, ear ache, nasal congestion, post nasal drip,  CV:  +chest pain, no-orthopnea, PND, swelling in lower extremities, anasarca,  dizziness, palpitations Resp: No-   shortness of breath with exertion or at rest.              No-   productive cough,  No non-productive cough,  No- coughing up of blood.              No-   change in color of mucus.  No- wheezing.   Skin: No-   rash or lesions. GI:  No-   heartburn, indigestion, abdominal pain, nausea, vomiting,  GU: . MS: +  joint pain or swelling.  No- decreased range of motion.  + back pain. Neuro-     nothing unusual Psych:  No- change in mood or affect. + depression or anxiety.  No memory loss.   OBJ- Physical Exam General- Alert, Oriented, Affect-appropriate, Distress- none acute Skin- rash-none, lesions- none, excoriation- none Lymphadenopathy- none Head- atraumatic            Eyes- Gross vision intact, PERRLA, conjunctivae and secretions clear            Ears- Hearing, canals-normal            Nose- Clear, no-Septal dev, mucus, polyps, erosion, perforation             Throat- Mallampati II , mucosa clear , drainage- none, tonsils- atrophic Neck- flexible , trachea midline, no stridor , thyroid nl, carotid no bruit Chest - symmetrical excursion , unlabored            Heart/CV- RRR , no murmur , no gallop  , no rub, nl s1 s2                           - JVD- none , edema- none, stasis changes- none, varices- none           Lung- clear to P&A, wheeze- none, cough-none , dullness-none, rub- none           Chest wall- not obviously tender Abd-  Br/ Gen/ Rectal- Not done, not indicated Extrem- cyanosis- none, clubbing, none, atrophy- none, strength- muscular. Brace on left leg Neuro- grossly intact to observation

## 2013-03-24 NOTE — Patient Instructions (Addendum)
Sample Breo Ellipta     1 puff then rinse mouth one time daily     Try this instead of Advair. When it runs out, go back to Advair  Please call as needed

## 2013-03-25 ENCOUNTER — Ambulatory Visit: Payer: Medicare Other

## 2013-04-01 ENCOUNTER — Ambulatory Visit (INDEPENDENT_AMBULATORY_CARE_PROVIDER_SITE_OTHER): Payer: Medicare Other

## 2013-04-01 DIAGNOSIS — J309 Allergic rhinitis, unspecified: Secondary | ICD-10-CM

## 2013-04-03 NOTE — Assessment & Plan Note (Signed)
Try sample Breo Ellipta. Watch for effect on his right upper anterior chest discomfort which he thinks is related to his breathing.

## 2013-04-03 NOTE — Assessment & Plan Note (Signed)
We can continue allergy vaccine

## 2013-04-03 NOTE — Assessment & Plan Note (Signed)
Atypical chest pain

## 2013-04-08 ENCOUNTER — Ambulatory Visit (INDEPENDENT_AMBULATORY_CARE_PROVIDER_SITE_OTHER): Payer: Medicare Other

## 2013-04-08 DIAGNOSIS — J309 Allergic rhinitis, unspecified: Secondary | ICD-10-CM

## 2013-04-15 ENCOUNTER — Encounter: Payer: Self-pay | Admitting: Physical Medicine and Rehabilitation

## 2013-04-15 ENCOUNTER — Encounter
Payer: Medicare Other | Attending: Physical Medicine and Rehabilitation | Admitting: Physical Medicine and Rehabilitation

## 2013-04-15 ENCOUNTER — Ambulatory Visit (INDEPENDENT_AMBULATORY_CARE_PROVIDER_SITE_OTHER): Payer: Medicare Other

## 2013-04-15 VITALS — BP 150/80 | HR 68 | Resp 16 | Ht 75.0 in | Wt 270.0 lb

## 2013-04-15 DIAGNOSIS — G8929 Other chronic pain: Secondary | ICD-10-CM | POA: Insufficient documentation

## 2013-04-15 DIAGNOSIS — Z79899 Other long term (current) drug therapy: Secondary | ICD-10-CM

## 2013-04-15 DIAGNOSIS — M47817 Spondylosis without myelopathy or radiculopathy, lumbosacral region: Secondary | ICD-10-CM

## 2013-04-15 DIAGNOSIS — G609 Hereditary and idiopathic neuropathy, unspecified: Secondary | ICD-10-CM | POA: Insufficient documentation

## 2013-04-15 DIAGNOSIS — M47812 Spondylosis without myelopathy or radiculopathy, cervical region: Secondary | ICD-10-CM | POA: Insufficient documentation

## 2013-04-15 DIAGNOSIS — M79609 Pain in unspecified limb: Secondary | ICD-10-CM | POA: Insufficient documentation

## 2013-04-15 DIAGNOSIS — J309 Allergic rhinitis, unspecified: Secondary | ICD-10-CM

## 2013-04-15 DIAGNOSIS — M25569 Pain in unspecified knee: Secondary | ICD-10-CM | POA: Insufficient documentation

## 2013-04-15 DIAGNOSIS — Z5181 Encounter for therapeutic drug level monitoring: Secondary | ICD-10-CM

## 2013-04-15 DIAGNOSIS — M545 Low back pain, unspecified: Secondary | ICD-10-CM | POA: Insufficient documentation

## 2013-04-15 DIAGNOSIS — G5730 Lesion of lateral popliteal nerve, unspecified lower limb: Secondary | ICD-10-CM | POA: Insufficient documentation

## 2013-04-15 DIAGNOSIS — M719 Bursopathy, unspecified: Secondary | ICD-10-CM | POA: Insufficient documentation

## 2013-04-15 DIAGNOSIS — W08XXXA Fall from other furniture, initial encounter: Secondary | ICD-10-CM | POA: Insufficient documentation

## 2013-04-15 DIAGNOSIS — R55 Syncope and collapse: Secondary | ICD-10-CM | POA: Insufficient documentation

## 2013-04-15 DIAGNOSIS — R209 Unspecified disturbances of skin sensation: Secondary | ICD-10-CM | POA: Insufficient documentation

## 2013-04-15 DIAGNOSIS — M67919 Unspecified disorder of synovium and tendon, unspecified shoulder: Secondary | ICD-10-CM | POA: Insufficient documentation

## 2013-04-15 DIAGNOSIS — R29898 Other symptoms and signs involving the musculoskeletal system: Secondary | ICD-10-CM | POA: Insufficient documentation

## 2013-04-15 MED ORDER — MORPHINE SULFATE ER 20 MG PO CP24: 20.0000 mg | ORAL_CAPSULE | Freq: Every day | ORAL | Status: DC
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
## 2013-04-15 NOTE — Patient Instructions (Signed)
Continue with your exercise program as pain permits.

## 2013-04-18 ENCOUNTER — Telehealth: Payer: Self-pay

## 2013-04-18 NOTE — Telephone Encounter (Signed)
Pt would like a copy of x-rays that were done on March 4th, 2014. Best# [phone]

## 2013-04-19 NOTE — Telephone Encounter (Signed)
Request given to xray 

## 2013-04-21 ENCOUNTER — Ambulatory Visit (INDEPENDENT_AMBULATORY_CARE_PROVIDER_SITE_OTHER): Payer: Medicare Other

## 2013-04-21 DIAGNOSIS — J309 Allergic rhinitis, unspecified: Secondary | ICD-10-CM

## 2013-04-22 ENCOUNTER — Ambulatory Visit: Payer: Medicare Other

## 2013-04-28 ENCOUNTER — Ambulatory Visit (INDEPENDENT_AMBULATORY_CARE_PROVIDER_SITE_OTHER): Payer: Medicare Other

## 2013-04-28 DIAGNOSIS — J309 Allergic rhinitis, unspecified: Secondary | ICD-10-CM

## 2013-05-06 ENCOUNTER — Ambulatory Visit (INDEPENDENT_AMBULATORY_CARE_PROVIDER_SITE_OTHER): Payer: Medicare Other

## 2013-05-06 DIAGNOSIS — J309 Allergic rhinitis, unspecified: Secondary | ICD-10-CM

## 2013-05-12 ENCOUNTER — Encounter: Payer: Self-pay | Admitting: Physical Medicine and Rehabilitation

## 2013-05-12 ENCOUNTER — Encounter
Payer: Medicare Other | Attending: Physical Medicine and Rehabilitation | Admitting: Physical Medicine and Rehabilitation

## 2013-05-12 ENCOUNTER — Ambulatory Visit (INDEPENDENT_AMBULATORY_CARE_PROVIDER_SITE_OTHER): Payer: Medicare Other

## 2013-05-12 VITALS — BP 124/71 | HR 65 | Resp 14 | Ht 75.0 in | Wt 275.0 lb

## 2013-05-12 DIAGNOSIS — M25569 Pain in unspecified knee: Secondary | ICD-10-CM | POA: Insufficient documentation

## 2013-05-12 DIAGNOSIS — S93409A Sprain of unspecified ligament of unspecified ankle, initial encounter: Secondary | ICD-10-CM | POA: Insufficient documentation

## 2013-05-12 DIAGNOSIS — W19XXXA Unspecified fall, initial encounter: Secondary | ICD-10-CM | POA: Insufficient documentation

## 2013-05-12 DIAGNOSIS — M47817 Spondylosis without myelopathy or radiculopathy, lumbosacral region: Secondary | ICD-10-CM | POA: Insufficient documentation

## 2013-05-12 DIAGNOSIS — M25559 Pain in unspecified hip: Secondary | ICD-10-CM | POA: Insufficient documentation

## 2013-05-12 DIAGNOSIS — G8929 Other chronic pain: Secondary | ICD-10-CM | POA: Insufficient documentation

## 2013-05-12 DIAGNOSIS — G5730 Lesion of lateral popliteal nerve, unspecified lower limb: Secondary | ICD-10-CM | POA: Insufficient documentation

## 2013-05-12 DIAGNOSIS — M629 Disorder of muscle, unspecified: Secondary | ICD-10-CM | POA: Insufficient documentation

## 2013-05-12 DIAGNOSIS — M25511 Pain in right shoulder: Secondary | ICD-10-CM

## 2013-05-12 DIAGNOSIS — M2420 Disorder of ligament, unspecified site: Secondary | ICD-10-CM | POA: Insufficient documentation

## 2013-05-12 DIAGNOSIS — J309 Allergic rhinitis, unspecified: Secondary | ICD-10-CM

## 2013-05-12 DIAGNOSIS — M25519 Pain in unspecified shoulder: Secondary | ICD-10-CM

## 2013-05-12 MED ORDER — MORPHINE SULFATE ER 20 MG PO CP24: 20.0000 mg | ORAL_CAPSULE | Freq: Every day | ORAL | Status: DC

## 2013-05-12 MED ORDER — MELOXICAM 15 MG PO TABS: 15.0000 mg | ORAL_TABLET | Freq: Every day | ORAL | Status: DC

## 2013-05-12 NOTE — Patient Instructions (Addendum)
Try to stay as active as tolerated. Do not take the mobic and the etodolac, you can only take one of those anti inflammatories !
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
## 2013-05-19 ENCOUNTER — Ambulatory Visit: Payer: Medicare Other

## 2013-05-20 ENCOUNTER — Ambulatory Visit (INDEPENDENT_AMBULATORY_CARE_PROVIDER_SITE_OTHER): Payer: Medicare Other

## 2013-05-20 ENCOUNTER — Telehealth: Payer: Self-pay | Admitting: Internal Medicine

## 2013-05-20 DIAGNOSIS — J309 Allergic rhinitis, unspecified: Secondary | ICD-10-CM

## 2013-05-20 NOTE — Telephone Encounter (Signed)
There is no place like that that I know of.His best bet is to have a friend stay with him

## 2013-05-20 NOTE — Telephone Encounter (Signed)
Pt notified of [facility] recommendations

## 2013-05-20 NOTE — Telephone Encounter (Signed)
Pt states that he hurt his shoulder back at work in February and they sent him to Dr. Turner Daniels. They are saying he needs surgery but Dr. Turner Daniels won't approve him to stay over night and he doesn't want to stay home by himself so he wants to know where he can go to stay after the surgery so he can have some help. This is a worker's comp thing. But he said if he could find a place he would pay for it.

## 2013-05-20 NOTE — Telephone Encounter (Signed)
Called pt but phone just rang and rang 

## 2013-05-27 ENCOUNTER — Ambulatory Visit (INDEPENDENT_AMBULATORY_CARE_PROVIDER_SITE_OTHER): Payer: Medicare Other

## 2013-05-27 DIAGNOSIS — J309 Allergic rhinitis, unspecified: Secondary | ICD-10-CM

## 2013-06-03 ENCOUNTER — Ambulatory Visit (INDEPENDENT_AMBULATORY_CARE_PROVIDER_SITE_OTHER): Payer: Medicare Other

## 2013-06-03 DIAGNOSIS — J309 Allergic rhinitis, unspecified: Secondary | ICD-10-CM

## 2013-06-10 ENCOUNTER — Encounter
Payer: Medicare Other | Attending: Physical Medicine and Rehabilitation | Admitting: Physical Medicine and Rehabilitation

## 2013-06-10 ENCOUNTER — Encounter: Payer: Self-pay | Admitting: Physical Medicine and Rehabilitation

## 2013-06-10 ENCOUNTER — Ambulatory Visit (INDEPENDENT_AMBULATORY_CARE_PROVIDER_SITE_OTHER): Payer: Medicare Other

## 2013-06-10 VITALS — BP 163/79 | HR 91 | Resp 14 | Ht 75.0 in | Wt 275.2 lb

## 2013-06-10 DIAGNOSIS — G609 Hereditary and idiopathic neuropathy, unspecified: Secondary | ICD-10-CM | POA: Insufficient documentation

## 2013-06-10 DIAGNOSIS — M79609 Pain in unspecified limb: Secondary | ICD-10-CM | POA: Insufficient documentation

## 2013-06-10 DIAGNOSIS — X58XXXA Exposure to other specified factors, initial encounter: Secondary | ICD-10-CM | POA: Insufficient documentation

## 2013-06-10 DIAGNOSIS — M549 Dorsalgia, unspecified: Secondary | ICD-10-CM

## 2013-06-10 DIAGNOSIS — M47817 Spondylosis without myelopathy or radiculopathy, lumbosacral region: Secondary | ICD-10-CM

## 2013-06-10 DIAGNOSIS — G5730 Lesion of lateral popliteal nerve, unspecified lower limb: Secondary | ICD-10-CM | POA: Insufficient documentation

## 2013-06-10 DIAGNOSIS — J309 Allergic rhinitis, unspecified: Secondary | ICD-10-CM

## 2013-06-10 DIAGNOSIS — M25569 Pain in unspecified knee: Secondary | ICD-10-CM | POA: Insufficient documentation

## 2013-06-10 DIAGNOSIS — G8929 Other chronic pain: Secondary | ICD-10-CM

## 2013-06-10 DIAGNOSIS — S93409A Sprain of unspecified ligament of unspecified ankle, initial encounter: Secondary | ICD-10-CM | POA: Insufficient documentation

## 2013-06-10 DIAGNOSIS — M779 Enthesopathy, unspecified: Secondary | ICD-10-CM | POA: Insufficient documentation

## 2013-06-10 MED ORDER — MORPHINE SULFATE ER 20 MG PO CP24: 20.0000 mg | ORAL_CAPSULE | Freq: Every day | ORAL | Status: DC
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
## 2013-06-10 NOTE — Patient Instructions (Signed)
Stay as active as tolerated. 

## 2013-06-16 ENCOUNTER — Other Ambulatory Visit: Payer: Self-pay | Admitting: Physical Medicine and Rehabilitation

## 2013-06-17 ENCOUNTER — Ambulatory Visit (INDEPENDENT_AMBULATORY_CARE_PROVIDER_SITE_OTHER): Payer: Medicare Other

## 2013-06-17 DIAGNOSIS — J309 Allergic rhinitis, unspecified: Secondary | ICD-10-CM

## 2013-06-24 ENCOUNTER — Ambulatory Visit (INDEPENDENT_AMBULATORY_CARE_PROVIDER_SITE_OTHER): Payer: Medicare Other

## 2013-06-24 DIAGNOSIS — J309 Allergic rhinitis, unspecified: Secondary | ICD-10-CM

## 2013-07-01 ENCOUNTER — Ambulatory Visit (INDEPENDENT_AMBULATORY_CARE_PROVIDER_SITE_OTHER): Payer: Medicare Other

## 2013-07-01 DIAGNOSIS — J309 Allergic rhinitis, unspecified: Secondary | ICD-10-CM

## 2013-07-08 ENCOUNTER — Ambulatory Visit (INDEPENDENT_AMBULATORY_CARE_PROVIDER_SITE_OTHER): Payer: Medicare Other

## 2013-07-08 DIAGNOSIS — J309 Allergic rhinitis, unspecified: Secondary | ICD-10-CM

## 2013-07-14 ENCOUNTER — Encounter
Payer: Medicare Other | Attending: Physical Medicine and Rehabilitation | Admitting: Physical Medicine and Rehabilitation

## 2013-07-14 ENCOUNTER — Ambulatory Visit (INDEPENDENT_AMBULATORY_CARE_PROVIDER_SITE_OTHER): Payer: Medicare Other

## 2013-07-14 ENCOUNTER — Encounter: Payer: Self-pay | Admitting: Physical Medicine and Rehabilitation

## 2013-07-14 VITALS — BP 134/80 | HR 62 | Resp 14 | Ht 75.0 in | Wt 279.0 lb

## 2013-07-14 DIAGNOSIS — M25569 Pain in unspecified knee: Secondary | ICD-10-CM | POA: Insufficient documentation

## 2013-07-14 DIAGNOSIS — G609 Hereditary and idiopathic neuropathy, unspecified: Secondary | ICD-10-CM | POA: Insufficient documentation

## 2013-07-14 DIAGNOSIS — M79609 Pain in unspecified limb: Secondary | ICD-10-CM | POA: Insufficient documentation

## 2013-07-14 DIAGNOSIS — S93409A Sprain of unspecified ligament of unspecified ankle, initial encounter: Secondary | ICD-10-CM | POA: Insufficient documentation

## 2013-07-14 DIAGNOSIS — M779 Enthesopathy, unspecified: Secondary | ICD-10-CM | POA: Insufficient documentation

## 2013-07-14 DIAGNOSIS — G8929 Other chronic pain: Secondary | ICD-10-CM | POA: Insufficient documentation

## 2013-07-14 DIAGNOSIS — M545 Low back pain, unspecified: Secondary | ICD-10-CM | POA: Insufficient documentation

## 2013-07-14 DIAGNOSIS — R209 Unspecified disturbances of skin sensation: Secondary | ICD-10-CM | POA: Insufficient documentation

## 2013-07-14 DIAGNOSIS — J309 Allergic rhinitis, unspecified: Secondary | ICD-10-CM

## 2013-07-14 DIAGNOSIS — X58XXXA Exposure to other specified factors, initial encounter: Secondary | ICD-10-CM | POA: Insufficient documentation

## 2013-07-14 DIAGNOSIS — M47817 Spondylosis without myelopathy or radiculopathy, lumbosacral region: Secondary | ICD-10-CM | POA: Insufficient documentation

## 2013-07-14 MED ORDER — GABAPENTIN 300 MG PO CAPS: ORAL_CAPSULE | ORAL | Status: DC

## 2013-07-14 MED ORDER — MORPHINE SULFATE ER 20 MG PO CP24: 20.0000 mg | ORAL_CAPSULE | Freq: Every day | ORAL | Status: DC

## 2013-07-14 NOTE — Patient Instructions (Signed)
Continue with your exercise and walking program as tolerated. 
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
## 2013-07-15 ENCOUNTER — Ambulatory Visit (INDEPENDENT_AMBULATORY_CARE_PROVIDER_SITE_OTHER): Payer: Medicare Other

## 2013-07-15 DIAGNOSIS — J309 Allergic rhinitis, unspecified: Secondary | ICD-10-CM

## 2013-07-22 ENCOUNTER — Ambulatory Visit (INDEPENDENT_AMBULATORY_CARE_PROVIDER_SITE_OTHER): Payer: Medicare Other

## 2013-07-22 DIAGNOSIS — J309 Allergic rhinitis, unspecified: Secondary | ICD-10-CM

## 2013-07-29 ENCOUNTER — Ambulatory Visit (INDEPENDENT_AMBULATORY_CARE_PROVIDER_SITE_OTHER): Payer: Medicare Other

## 2013-07-29 DIAGNOSIS — J309 Allergic rhinitis, unspecified: Secondary | ICD-10-CM

## 2013-08-05 ENCOUNTER — Ambulatory Visit (INDEPENDENT_AMBULATORY_CARE_PROVIDER_SITE_OTHER): Payer: Medicare Other

## 2013-08-05 DIAGNOSIS — J309 Allergic rhinitis, unspecified: Secondary | ICD-10-CM

## 2013-08-05 DIAGNOSIS — Z23 Encounter for immunization: Secondary | ICD-10-CM

## 2013-08-12 ENCOUNTER — Ambulatory Visit (INDEPENDENT_AMBULATORY_CARE_PROVIDER_SITE_OTHER): Payer: Medicare Other

## 2013-08-12 DIAGNOSIS — J309 Allergic rhinitis, unspecified: Secondary | ICD-10-CM

## 2013-08-16 ENCOUNTER — Encounter: Payer: Self-pay | Admitting: Physical Medicine and Rehabilitation

## 2013-08-16 ENCOUNTER — Encounter
Payer: Medicare Other | Attending: Physical Medicine and Rehabilitation | Admitting: Physical Medicine and Rehabilitation

## 2013-08-16 VITALS — BP 132/60 | HR 61 | Resp 14 | Ht 75.0 in | Wt 278.0 lb

## 2013-08-16 DIAGNOSIS — M779 Enthesopathy, unspecified: Secondary | ICD-10-CM | POA: Insufficient documentation

## 2013-08-16 DIAGNOSIS — Z79899 Other long term (current) drug therapy: Secondary | ICD-10-CM | POA: Insufficient documentation

## 2013-08-16 DIAGNOSIS — IMO0001 Reserved for inherently not codable concepts without codable children: Secondary | ICD-10-CM

## 2013-08-16 DIAGNOSIS — M47817 Spondylosis without myelopathy or radiculopathy, lumbosacral region: Secondary | ICD-10-CM | POA: Insufficient documentation

## 2013-08-16 DIAGNOSIS — X58XXXA Exposure to other specified factors, initial encounter: Secondary | ICD-10-CM | POA: Insufficient documentation

## 2013-08-16 DIAGNOSIS — G8929 Other chronic pain: Secondary | ICD-10-CM | POA: Insufficient documentation

## 2013-08-16 DIAGNOSIS — M25569 Pain in unspecified knee: Secondary | ICD-10-CM | POA: Insufficient documentation

## 2013-08-16 DIAGNOSIS — G5730 Lesion of lateral popliteal nerve, unspecified lower limb: Secondary | ICD-10-CM | POA: Insufficient documentation

## 2013-08-16 DIAGNOSIS — M7918 Myalgia, other site: Secondary | ICD-10-CM

## 2013-08-16 DIAGNOSIS — S93409A Sprain of unspecified ligament of unspecified ankle, initial encounter: Secondary | ICD-10-CM | POA: Insufficient documentation

## 2013-08-16 DIAGNOSIS — G609 Hereditary and idiopathic neuropathy, unspecified: Secondary | ICD-10-CM | POA: Insufficient documentation

## 2013-08-16 MED ORDER — MORPHINE SULFATE ER 20 MG PO CP24: 20.0000 mg | ORAL_CAPSULE | Freq: Every day | ORAL | Status: DC
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
## 2013-08-16 NOTE — Patient Instructions (Signed)
Continue with your exercise program as tolerated 

## 2013-08-19 ENCOUNTER — Ambulatory Visit (INDEPENDENT_AMBULATORY_CARE_PROVIDER_SITE_OTHER): Payer: Medicare Other

## 2013-08-19 DIAGNOSIS — J309 Allergic rhinitis, unspecified: Secondary | ICD-10-CM

## 2013-08-26 ENCOUNTER — Ambulatory Visit (INDEPENDENT_AMBULATORY_CARE_PROVIDER_SITE_OTHER): Payer: Medicare Other

## 2013-08-26 DIAGNOSIS — J309 Allergic rhinitis, unspecified: Secondary | ICD-10-CM

## 2013-09-09 ENCOUNTER — Ambulatory Visit (INDEPENDENT_AMBULATORY_CARE_PROVIDER_SITE_OTHER): Payer: Medicare Other

## 2013-09-09 DIAGNOSIS — J309 Allergic rhinitis, unspecified: Secondary | ICD-10-CM

## 2013-09-14 ENCOUNTER — Encounter
Payer: Medicare Other | Attending: Physical Medicine and Rehabilitation | Admitting: Physical Medicine and Rehabilitation

## 2013-09-14 ENCOUNTER — Encounter: Payer: Self-pay | Admitting: Physical Medicine and Rehabilitation

## 2013-09-14 VITALS — BP 138/85 | HR 71 | Resp 14 | Ht 75.0 in | Wt 277.0 lb

## 2013-09-14 DIAGNOSIS — G5730 Lesion of lateral popliteal nerve, unspecified lower limb: Secondary | ICD-10-CM | POA: Insufficient documentation

## 2013-09-14 DIAGNOSIS — IMO0001 Reserved for inherently not codable concepts without codable children: Secondary | ICD-10-CM

## 2013-09-14 DIAGNOSIS — M25569 Pain in unspecified knee: Secondary | ICD-10-CM | POA: Insufficient documentation

## 2013-09-14 DIAGNOSIS — Z79899 Other long term (current) drug therapy: Secondary | ICD-10-CM | POA: Insufficient documentation

## 2013-09-14 DIAGNOSIS — M25519 Pain in unspecified shoulder: Secondary | ICD-10-CM | POA: Insufficient documentation

## 2013-09-14 DIAGNOSIS — Z5181 Encounter for therapeutic drug level monitoring: Secondary | ICD-10-CM

## 2013-09-14 DIAGNOSIS — G609 Hereditary and idiopathic neuropathy, unspecified: Secondary | ICD-10-CM | POA: Insufficient documentation

## 2013-09-14 DIAGNOSIS — G8929 Other chronic pain: Secondary | ICD-10-CM | POA: Insufficient documentation

## 2013-09-14 DIAGNOSIS — M47817 Spondylosis without myelopathy or radiculopathy, lumbosacral region: Secondary | ICD-10-CM | POA: Insufficient documentation
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
## 2013-09-14 NOTE — Patient Instructions (Signed)
Follow the instructions of your surgeon, follow up with your surgeon

## 2013-09-16 ENCOUNTER — Ambulatory Visit (INDEPENDENT_AMBULATORY_CARE_PROVIDER_SITE_OTHER): Payer: Medicare Other

## 2013-09-16 DIAGNOSIS — J309 Allergic rhinitis, unspecified: Secondary | ICD-10-CM

## 2013-09-23 ENCOUNTER — Ambulatory Visit (INDEPENDENT_AMBULATORY_CARE_PROVIDER_SITE_OTHER): Payer: Medicare Other

## 2013-09-23 DIAGNOSIS — J309 Allergic rhinitis, unspecified: Secondary | ICD-10-CM

## 2013-09-24 ENCOUNTER — Other Ambulatory Visit: Payer: Self-pay | Admitting: Internal Medicine

## 2013-09-29 ENCOUNTER — Ambulatory Visit (INDEPENDENT_AMBULATORY_CARE_PROVIDER_SITE_OTHER): Payer: Medicare Other | Admitting: Internal Medicine

## 2013-09-29 ENCOUNTER — Encounter: Payer: Self-pay | Admitting: Internal Medicine

## 2013-09-29 ENCOUNTER — Ambulatory Visit (INDEPENDENT_AMBULATORY_CARE_PROVIDER_SITE_OTHER): Payer: Medicare Other

## 2013-09-29 VITALS — BP 124/80 | HR 67 | Ht 75.0 in | Wt 277.0 lb

## 2013-09-29 DIAGNOSIS — J45998 Other asthma: Secondary | ICD-10-CM

## 2013-09-29 DIAGNOSIS — J45909 Unspecified asthma, uncomplicated: Secondary | ICD-10-CM

## 2013-09-29 DIAGNOSIS — J301 Allergic rhinitis due to pollen: Secondary | ICD-10-CM

## 2013-09-29 DIAGNOSIS — J309 Allergic rhinitis, unspecified: Secondary | ICD-10-CM

## 2013-09-29 MED ORDER — BENZONATATE 100 MG PO CAPS: 200.0000 mg | ORAL_CAPSULE | Freq: Three times a day (TID) | ORAL | Status: DC | PRN

## 2013-09-29 MED ORDER — FLUTICASONE-SALMETEROL 500-50 MCG/DOSE IN AEPB: INHALATION_SPRAY | RESPIRATORY_TRACT | Status: DC

## 2013-09-29 MED ORDER — ALBUTEROL SULFATE HFA 108 (90 BASE) MCG/ACT IN AERS: 2.0000 | INHALATION_SPRAY | RESPIRATORY_TRACT | Status: DC | PRN

## 2013-09-29 NOTE — Progress Notes (Signed)
Patient ID: Kristopher Gay, male    DOB: 09/18/1951, 62 y.o.   MRN: [id number]  HPI 62 yo never smoker followed for allergic rhinitis and allergic asthma, complicated by chronic musculoskeletal pain after an injury. Last here August 16, 2010. That note reviewed. He got through the winter ok, still limited by neurologic complaints. He reports a change in cough, deeper with some tightness over last 4 months. No change in perennial sinus drainage and occasional hoarseness. He continues allergy vaccine here at 1:10. 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 He continues Advair 250, reporting no difference with Advair 500 or with Dulera. Nasal sprays seem to work.  Needs refill tessalon, epipen and flonase.   09/19/11-59 yo never smoker followed for allergic rhinitis and allergic asthma, complicated by chronic musculoskeletal pain after an injury. He has noted cough and wheeze more often with cool weather, and some shortness of breath which may be worse. Nothing dramatic. Singing triggers chest tightness. Cold air makes his nose run. Pain management has told him his substernal pain is related to his airways although I have felt it was more likely musculoskeletal. He has been using Advair 250 but wants to try going back to Advair 500 which we discussed.  03/19/12- 81 yo never smoker followed for allergic rhinitis and allergic asthma, complicated by chronic musculoskeletal pain after an injury. Coughing-productive-gray in color; still on vaccine He blames pain in his legs today increased blood pressure. Continues allergy vaccine "not a bad spring". 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 He chose to stay on Advair 500. We discussed the effect of a dry powder steroid inhaler on vocal quality. Rarely needs a rescue inhaler. We discussed trying Dulera again with a spacer to see if that would help his throat.  07/02/12-  45 yo never smoker followed for allergic rhinitis and allergic  asthma, complicated by chronic musculoskeletal pain after an injury. ACUTE visit- stung yesterday on right temple and back of right hand by yellow jackets. Today has tense warm edema across dorsum of right hand and smaller, similar area R temple. No systemic reaction. Plan- depomedrol, sample allegra, cool compresses.   09/24/12-   45 yo never smoker followed for allergic rhinitis and allergic asthma, complicated by chronic musculoskeletal pain after an injury. FOLLOWS FOR: states that he has been having "coughing attacks" for about 3 months. He vomits when he coughs too much. He describes hard coughing paroxysms, wet or dry cough, scant clear mucus. Not clear if he refluxes first but sometimes coughing leads to retching. Also not clear what triggers this cough. He complains of a sense of persistent mild chest congestion at rest and says breathing is "an effort" with occasional wheeze noted. He continues to be followed at pain management where he says oxygen saturation sometimes gets into the 80s there. He preferred Advair after trying Dulera and Symbicort. He describes history of what may have been systemic reaction to multiple yellow jacket stings in the past. Details unclear.  03/24/13- 46 yo never smoker followed for allergic rhinitis and allergic asthma, complicated by chronic musculoskeletal pain after an injury. FOLLOWS FOR: pain in center chest area-doesn't feel full like congestion.   Still on allergy vaccine 1:10 GH and no reactions.  Blames spring pollen for some increase in his chronic sense of right upper anterior chest discomfort, noticed mostly while mowing his lawn with a self-propelled mower. Discomfort is increased with deep breath and he says it is  clearly related to his breathing, not the amount of physical effort. He continues chronic pain clinic followup, polyneuropathy. CXR 09/28/12 IMPRESSION:  1. Hyperexpanded lungs and chronic bronchitic change without  definite acute  cardiopulmonary disease.  2. Borderline enlarged cardiac silhouette with prominence of the  central pulmonary vasculature, nonspecific but may be seen in the  setting of pulmonary arterial hypertension. Further evaluation  with cardiac echo may be performed as clinically indicated.  Original Report Authenticated By: Tacey Ruiz, MD 2D Echo-09/29/12-EF 65-70, mild LVH, grade 1 diastolic dysfunction, mild bi-atrial dilatation without pulmonary hypertension.  09/29/13- 68 yo never smoker followed for allergic rhinitis and allergic asthma, complicated by chronic musculoskeletal pain after an injury. FOLLOWS FOR: still on Allergy vaccine 1:10 GH and doing well; has had flare up of congestion,sore throat, drainage-took Allegra and helped. Needs refills on all medications. Had flu vaccine but worries that he might have the flu because of 2 or 3 days of watery rhinorrhea, sore throat, dry cough and sneeze. Low-grade fever.  ROS-see HPI Constitutional:   No-   weight loss, night sweats, fevers, chills, fatigue, lassitude. HEENT:   No-  headaches, difficulty swallowing, tooth/dental problems, +sore throat,       +sneezing, itching, ear ache, nasal congestion, +post nasal drip,  CV:  No-chest pain, no-orthopnea, PND, swelling in lower extremities, anasarca,  dizziness, palpitations Resp: No-   shortness of breath with exertion or at rest.              No-   productive cough,  + non-productive cough,  No- coughing up of blood.              No-   change in color of mucus.  No- wheezing.   Skin: No-   rash or lesions. GI:  No-   heartburn, indigestion, abdominal pain, nausea, vomiting,  GU: . MS: +  joint pain or swelling.  No- decreased range of motion.  + back pain. Neuro-     nothing unusual Psych:  No- change in mood or affect. + depression or anxiety.  No memory loss.   OBJ- Physical Exam General- Alert, Oriented, Affect-appropriate, Distress- none acute Skin- rash-none, lesions- none,  excoriation- none Lymphadenopathy- none Head- atraumatic            Eyes- Gross vision intact, PERRLA, conjunctivae and secretions clear            Ears- Hearing, canals-normal            Nose- Clear, no-Septal dev, mucus, polyps, erosion, perforation             Throat- Mallampati II , mucosa clear(throat lozenges) , drainage- none, tonsils- atrophic Neck- flexible , trachea midline, no stridor , thyroid nl, carotid no bruit Chest - symmetrical excursion , unlabored           Heart/CV- RRR , no murmur , no gallop  , no rub, nl s1 s2                           - JVD- none , edema- none, stasis changes- none, varices- none           Lung- clear to P&A, wheeze- none, cough-none , dullness-none, rub- none           Chest wall- not obviously tender Abd-  Br/ Gen/ Rectal- Not done, not indicated Extrem- cyanosis- none, clubbing, none, atrophy- none, strength- muscular. +boot left foot Neuro-  grossly intact to observation

## 2013-09-29 NOTE — Patient Instructions (Signed)
Refill scripts sent.  We can continue allergy vaccine

## 2013-10-07 ENCOUNTER — Ambulatory Visit: Payer: Medicare Other

## 2013-10-14 ENCOUNTER — Ambulatory Visit (INDEPENDENT_AMBULATORY_CARE_PROVIDER_SITE_OTHER): Payer: Medicare Other

## 2013-10-14 DIAGNOSIS — J309 Allergic rhinitis, unspecified: Secondary | ICD-10-CM

## 2013-10-18 NOTE — Assessment & Plan Note (Addendum)
Acute exacerbation consistent with viral infection Plan-managed conservatively with supportive care for now. Maintain routine medications.

## 2013-10-18 NOTE — Assessment & Plan Note (Signed)
Plan-continue allergy vaccine 1:10 GH. Antihistamines as needed.

## 2013-10-21 ENCOUNTER — Ambulatory Visit (INDEPENDENT_AMBULATORY_CARE_PROVIDER_SITE_OTHER): Payer: Medicare Other

## 2013-10-21 DIAGNOSIS — J309 Allergic rhinitis, unspecified: Secondary | ICD-10-CM

## 2013-10-28 ENCOUNTER — Ambulatory Visit (INDEPENDENT_AMBULATORY_CARE_PROVIDER_SITE_OTHER): Payer: Medicare Other

## 2013-10-28 DIAGNOSIS — J309 Allergic rhinitis, unspecified: Secondary | ICD-10-CM

## 2013-11-04 ENCOUNTER — Ambulatory Visit (INDEPENDENT_AMBULATORY_CARE_PROVIDER_SITE_OTHER): Payer: Medicare Other

## 2013-11-04 DIAGNOSIS — J309 Allergic rhinitis, unspecified: Secondary | ICD-10-CM

## 2013-11-11 ENCOUNTER — Ambulatory Visit (INDEPENDENT_AMBULATORY_CARE_PROVIDER_SITE_OTHER): Payer: Medicare Other

## 2013-11-11 DIAGNOSIS — J309 Allergic rhinitis, unspecified: Secondary | ICD-10-CM

## 2013-11-17 ENCOUNTER — Encounter: Payer: Self-pay | Admitting: Internal Medicine

## 2013-11-18 ENCOUNTER — Ambulatory Visit (INDEPENDENT_AMBULATORY_CARE_PROVIDER_SITE_OTHER): Payer: Medicare Other

## 2013-11-18 DIAGNOSIS — J309 Allergic rhinitis, unspecified: Secondary | ICD-10-CM

## 2013-11-21 ENCOUNTER — Ambulatory Visit (INDEPENDENT_AMBULATORY_CARE_PROVIDER_SITE_OTHER): Payer: Medicare Other

## 2013-11-21 DIAGNOSIS — J309 Allergic rhinitis, unspecified: Secondary | ICD-10-CM

## 2013-11-24 ENCOUNTER — Encounter: Payer: Self-pay | Admitting: Physical Medicine & Rehabilitation

## 2013-11-24 ENCOUNTER — Ambulatory Visit (HOSPITAL_BASED_OUTPATIENT_CLINIC_OR_DEPARTMENT_OTHER): Payer: Medicare Other | Admitting: Physical Medicine & Rehabilitation

## 2013-11-24 ENCOUNTER — Encounter: Payer: Medicare Other | Attending: Physical Medicine and Rehabilitation

## 2013-11-24 ENCOUNTER — Ambulatory Visit (INDEPENDENT_AMBULATORY_CARE_PROVIDER_SITE_OTHER): Payer: Medicare Other

## 2013-11-24 VITALS — BP 149/76 | HR 82 | Resp 14 | Ht 75.0 in | Wt 286.0 lb

## 2013-11-24 DIAGNOSIS — G8918 Other acute postprocedural pain: Secondary | ICD-10-CM

## 2013-11-24 DIAGNOSIS — M549 Dorsalgia, unspecified: Secondary | ICD-10-CM

## 2013-11-24 DIAGNOSIS — G609 Hereditary and idiopathic neuropathy, unspecified: Secondary | ICD-10-CM | POA: Insufficient documentation

## 2013-11-24 DIAGNOSIS — G8929 Other chronic pain: Secondary | ICD-10-CM

## 2013-11-24 DIAGNOSIS — Z79899 Other long term (current) drug therapy: Secondary | ICD-10-CM | POA: Insufficient documentation

## 2013-11-24 DIAGNOSIS — M47817 Spondylosis without myelopathy or radiculopathy, lumbosacral region: Secondary | ICD-10-CM | POA: Insufficient documentation

## 2013-11-24 DIAGNOSIS — J309 Allergic rhinitis, unspecified: Secondary | ICD-10-CM

## 2013-11-24 DIAGNOSIS — S43429A Sprain of unspecified rotator cuff capsule, initial encounter: Secondary | ICD-10-CM

## 2013-11-24 DIAGNOSIS — M25519 Pain in unspecified shoulder: Secondary | ICD-10-CM | POA: Insufficient documentation

## 2013-11-24 DIAGNOSIS — G5730 Lesion of lateral popliteal nerve, unspecified lower limb: Secondary | ICD-10-CM | POA: Insufficient documentation

## 2013-11-24 DIAGNOSIS — M25569 Pain in unspecified knee: Secondary | ICD-10-CM | POA: Insufficient documentation

## 2013-11-24 MED ORDER — GABAPENTIN 300 MG PO CAPS: ORAL_CAPSULE | ORAL | Status: DC

## 2013-11-24 MED ORDER — HYDROCODONE-ACETAMINOPHEN 10-325 MG PO TABS: 1.0000 | ORAL_TABLET | Freq: Three times a day (TID) | ORAL | Status: DC

## 2013-11-24 MED ORDER — CELECOXIB 200 MG PO CAPS: 200.0000 mg | ORAL_CAPSULE | Freq: Every day | ORAL | Status: DC
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
## 2013-11-24 NOTE — Patient Instructions (Signed)
Resume gabapentin

## 2013-11-25 ENCOUNTER — Ambulatory Visit: Payer: Medicare Other

## 2013-12-01 ENCOUNTER — Ambulatory Visit: Payer: Medicare Other

## 2013-12-02 ENCOUNTER — Ambulatory Visit (INDEPENDENT_AMBULATORY_CARE_PROVIDER_SITE_OTHER): Payer: Medicare Other

## 2013-12-02 DIAGNOSIS — J309 Allergic rhinitis, unspecified: Secondary | ICD-10-CM

## 2013-12-09 ENCOUNTER — Ambulatory Visit (INDEPENDENT_AMBULATORY_CARE_PROVIDER_SITE_OTHER): Payer: Medicare Other

## 2013-12-09 DIAGNOSIS — J309 Allergic rhinitis, unspecified: Secondary | ICD-10-CM

## 2013-12-12 ENCOUNTER — Ambulatory Visit: Payer: Medicare Other

## 2013-12-15 ENCOUNTER — Ambulatory Visit (INDEPENDENT_AMBULATORY_CARE_PROVIDER_SITE_OTHER): Payer: Medicare Other

## 2013-12-15 DIAGNOSIS — J309 Allergic rhinitis, unspecified: Secondary | ICD-10-CM

## 2013-12-20 ENCOUNTER — Other Ambulatory Visit: Payer: Self-pay | Admitting: *Deleted

## 2013-12-20 MED ORDER — HYDROCODONE-ACETAMINOPHEN 10-325 MG PO TABS: 1.0000 | ORAL_TABLET | Freq: Three times a day (TID) | ORAL | Status: DC

## 2013-12-20 MED ORDER — MORPHINE SULFATE ER 20 MG PO CP24: 20.0000 mg | ORAL_CAPSULE | Freq: Every day | ORAL | Status: DC

## 2013-12-20 NOTE — Telephone Encounter (Signed)
RX printed early for controlled medication for the visit with RN on 12/21/13 (to be signed by MD) 

## 2013-12-21 ENCOUNTER — Ambulatory Visit (INDEPENDENT_AMBULATORY_CARE_PROVIDER_SITE_OTHER): Payer: Medicare Other

## 2013-12-21 ENCOUNTER — Encounter: Payer: Self-pay | Admitting: *Deleted

## 2013-12-21 ENCOUNTER — Telehealth: Payer: Self-pay | Admitting: *Deleted

## 2013-12-21 ENCOUNTER — Encounter: Payer: Medicare Other | Attending: Physical Medicine & Rehabilitation | Admitting: *Deleted

## 2013-12-21 DIAGNOSIS — M545 Low back pain, unspecified: Secondary | ICD-10-CM | POA: Insufficient documentation

## 2013-12-21 DIAGNOSIS — G8929 Other chronic pain: Secondary | ICD-10-CM

## 2013-12-21 DIAGNOSIS — M79609 Pain in unspecified limb: Secondary | ICD-10-CM | POA: Insufficient documentation

## 2013-12-21 DIAGNOSIS — Z79899 Other long term (current) drug therapy: Secondary | ICD-10-CM | POA: Insufficient documentation

## 2013-12-21 DIAGNOSIS — G5790 Unspecified mononeuropathy of unspecified lower limb: Secondary | ICD-10-CM | POA: Insufficient documentation

## 2013-12-21 DIAGNOSIS — J309 Allergic rhinitis, unspecified: Secondary | ICD-10-CM

## 2013-12-21 DIAGNOSIS — M25569 Pain in unspecified knee: Secondary | ICD-10-CM | POA: Insufficient documentation

## 2013-12-21 DIAGNOSIS — M549 Dorsalgia, unspecified: Secondary | ICD-10-CM

## 2013-12-21 DIAGNOSIS — M47817 Spondylosis without myelopathy or radiculopathy, lumbosacral region: Secondary | ICD-10-CM | POA: Insufficient documentation

## 2013-12-21 DIAGNOSIS — Z9181 History of falling: Secondary | ICD-10-CM | POA: Insufficient documentation

## 2013-12-21 DIAGNOSIS — M25519 Pain in unspecified shoulder: Secondary | ICD-10-CM | POA: Insufficient documentation

## 2013-12-21 NOTE — Patient Instructions (Addendum)
Follow up one month Dr Letta Pate to discuss medications

## 2013-12-21 NOTE — Telephone Encounter (Signed)
Kristopher Gay was in to see me today and he is telling me that he has not been released by orthopedics except to come here for pain meds.  He is telling me that he is having more pain because the physical therapy is increasing intensity and this is making his shoulder hurt more.Marland KitchenHe says that the orthopedic was giving him more pain medication letting him take 4-6 pills per day and that we have cut him back and it is just not helping the pain.  I had a great deal of difficulty translating what he was trying to tell me ( according to him)   and feel he needs to be seen by Dr Letta Pate next month. In the meantime, he is requesting an increase in his medications. I had also printed a prescription for the kadian that he had on his list and has always taken but did not give him the rx because I saw in the note he was to remain only on short acting for now. Please advise

## 2013-12-21 NOTE — Progress Notes (Signed)
Here for pill count and medication refills. Hydrocodone 10/325 # 90 Fill date 11/24/13   Today NV# 12     I had printed  his Kadian because he was previously on it but I did not give him the rx because Dr Letta Pate opted to only put him on short acting Norco for now.  He is dissatisfied with the amount he is getting because he was able to take 4-6 tablets per day with ortho and we are restricting him from doing this. He feels like the flexibility should be there to take more if you need it- its not like he is going to take too much.  I explained to him that the thought is nice but the actual truth is that many who are prescribed narcotics cannot be given that flexibility because they will take more and too much and this has led to an alarming increase in OD in the [place] which is partially why the hydrocodone is now a CII instead of a CIII. I was not going to be able to address his issues with increasing medication and he became increasingly frustrated with me trying to repeat back  what he was telling me about his situation.  He felt I was not understanding. I told him I would inform Dr Letta Pate of his wish to have more medication but that it would not be addressed until Monday and in the meantime they could schedule his next appt with Dr Letta Pate since I am unable to make changes.

## 2013-12-22 NOTE — Telephone Encounter (Signed)
May instruct increase hydrocodone to QID

## 2013-12-23 ENCOUNTER — Ambulatory Visit: Payer: Medicare Other

## 2013-12-23 NOTE — Telephone Encounter (Signed)
I spoke with Kristopher Gay and informed him that he can increase his hydrocodone to 4 times per day from the tid and to call when he needs a refill.

## 2013-12-23 NOTE — Telephone Encounter (Signed)
Left VM to return call to office.  He may increase his hydrocodone to qid and call when he is needing a refill since he will be out early.

## 2013-12-30 ENCOUNTER — Ambulatory Visit (INDEPENDENT_AMBULATORY_CARE_PROVIDER_SITE_OTHER): Payer: Medicare Other

## 2013-12-30 DIAGNOSIS — J309 Allergic rhinitis, unspecified: Secondary | ICD-10-CM

## 2014-01-06 ENCOUNTER — Ambulatory Visit (INDEPENDENT_AMBULATORY_CARE_PROVIDER_SITE_OTHER): Payer: Medicare Other

## 2014-01-06 DIAGNOSIS — J309 Allergic rhinitis, unspecified: Secondary | ICD-10-CM

## 2014-01-12 ENCOUNTER — Ambulatory Visit (INDEPENDENT_AMBULATORY_CARE_PROVIDER_SITE_OTHER): Payer: Medicare Other

## 2014-01-12 DIAGNOSIS — J309 Allergic rhinitis, unspecified: Secondary | ICD-10-CM

## 2014-01-13 ENCOUNTER — Ambulatory Visit: Payer: Medicare Other

## 2014-01-16 ENCOUNTER — Ambulatory Visit (HOSPITAL_BASED_OUTPATIENT_CLINIC_OR_DEPARTMENT_OTHER): Payer: Medicare Other | Admitting: Physical Medicine & Rehabilitation

## 2014-01-16 ENCOUNTER — Encounter: Payer: Medicare Other | Attending: Physical Medicine & Rehabilitation

## 2014-01-16 ENCOUNTER — Encounter: Payer: Self-pay | Admitting: Physical Medicine & Rehabilitation

## 2014-01-16 VITALS — BP 132/80 | HR 72 | Resp 14 | Ht 75.0 in | Wt 283.0 lb

## 2014-01-16 DIAGNOSIS — G8928 Other chronic postprocedural pain: Secondary | ICD-10-CM | POA: Insufficient documentation

## 2014-01-16 DIAGNOSIS — M47817 Spondylosis without myelopathy or radiculopathy, lumbosacral region: Secondary | ICD-10-CM | POA: Insufficient documentation

## 2014-01-16 DIAGNOSIS — M25569 Pain in unspecified knee: Secondary | ICD-10-CM | POA: Insufficient documentation

## 2014-01-16 DIAGNOSIS — Z79899 Other long term (current) drug therapy: Secondary | ICD-10-CM | POA: Insufficient documentation

## 2014-01-16 DIAGNOSIS — M25519 Pain in unspecified shoulder: Secondary | ICD-10-CM | POA: Insufficient documentation

## 2014-01-16 DIAGNOSIS — G8929 Other chronic pain: Secondary | ICD-10-CM | POA: Insufficient documentation

## 2014-01-16 DIAGNOSIS — G609 Hereditary and idiopathic neuropathy, unspecified: Secondary | ICD-10-CM | POA: Insufficient documentation

## 2014-01-16 DIAGNOSIS — G5730 Lesion of lateral popliteal nerve, unspecified lower limb: Secondary | ICD-10-CM | POA: Insufficient documentation

## 2014-01-16 MED ORDER — HYDROCODONE-ACETAMINOPHEN 10-325 MG PO TABS: 1.0000 | ORAL_TABLET | Freq: Three times a day (TID) | ORAL | Status: DC

## 2014-01-16 MED ORDER — CELECOXIB 200 MG PO CAPS: 200.0000 mg | ORAL_CAPSULE | Freq: Every day | ORAL | Status: DC

## 2014-01-16 MED ORDER — GABAPENTIN 300 MG PO CAPS: ORAL_CAPSULE | ORAL | Status: DC

## 2014-01-16 NOTE — Patient Instructions (Signed)
You are on the equivalent of 60mg  of morphine a day
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
## 2014-01-19 ENCOUNTER — Ambulatory Visit (INDEPENDENT_AMBULATORY_CARE_PROVIDER_SITE_OTHER): Payer: Medicare Other

## 2014-01-19 DIAGNOSIS — J309 Allergic rhinitis, unspecified: Secondary | ICD-10-CM

## 2014-01-26 ENCOUNTER — Ambulatory Visit (INDEPENDENT_AMBULATORY_CARE_PROVIDER_SITE_OTHER): Payer: Medicare Other

## 2014-01-26 DIAGNOSIS — J309 Allergic rhinitis, unspecified: Secondary | ICD-10-CM

## 2014-02-03 ENCOUNTER — Ambulatory Visit (INDEPENDENT_AMBULATORY_CARE_PROVIDER_SITE_OTHER): Payer: Medicare Other

## 2014-02-03 DIAGNOSIS — J309 Allergic rhinitis, unspecified: Secondary | ICD-10-CM

## 2014-02-10 ENCOUNTER — Ambulatory Visit (INDEPENDENT_AMBULATORY_CARE_PROVIDER_SITE_OTHER): Payer: Medicare Other

## 2014-02-10 DIAGNOSIS — J309 Allergic rhinitis, unspecified: Secondary | ICD-10-CM

## 2014-02-17 ENCOUNTER — Ambulatory Visit (INDEPENDENT_AMBULATORY_CARE_PROVIDER_SITE_OTHER): Payer: Medicare Other

## 2014-02-17 DIAGNOSIS — J309 Allergic rhinitis, unspecified: Secondary | ICD-10-CM

## 2014-02-20 ENCOUNTER — Encounter: Payer: Self-pay | Admitting: Physical Medicine & Rehabilitation

## 2014-02-20 ENCOUNTER — Encounter: Payer: Medicare Other | Attending: Physical Medicine & Rehabilitation

## 2014-02-20 ENCOUNTER — Ambulatory Visit (HOSPITAL_BASED_OUTPATIENT_CLINIC_OR_DEPARTMENT_OTHER): Payer: Medicare Other | Admitting: Physical Medicine & Rehabilitation

## 2014-02-20 VITALS — BP 147/81 | HR 76 | Resp 14 | Ht 75.0 in | Wt 285.0 lb

## 2014-02-20 DIAGNOSIS — S8410XA Injury of peroneal nerve at lower leg level, unspecified leg, initial encounter: Secondary | ICD-10-CM

## 2014-02-20 DIAGNOSIS — Z79899 Other long term (current) drug therapy: Secondary | ICD-10-CM | POA: Insufficient documentation

## 2014-02-20 DIAGNOSIS — M25519 Pain in unspecified shoulder: Secondary | ICD-10-CM | POA: Insufficient documentation

## 2014-02-20 DIAGNOSIS — M25569 Pain in unspecified knee: Secondary | ICD-10-CM | POA: Insufficient documentation

## 2014-02-20 DIAGNOSIS — M549 Dorsalgia, unspecified: Secondary | ICD-10-CM

## 2014-02-20 DIAGNOSIS — G609 Hereditary and idiopathic neuropathy, unspecified: Secondary | ICD-10-CM | POA: Insufficient documentation

## 2014-02-20 DIAGNOSIS — G5730 Lesion of lateral popliteal nerve, unspecified lower limb: Secondary | ICD-10-CM | POA: Insufficient documentation

## 2014-02-20 DIAGNOSIS — M47817 Spondylosis without myelopathy or radiculopathy, lumbosacral region: Secondary | ICD-10-CM

## 2014-02-20 DIAGNOSIS — G8929 Other chronic pain: Secondary | ICD-10-CM

## 2014-02-20 MED ORDER — HYDROCODONE-ACETAMINOPHEN 10-325 MG PO TABS: 1.0000 | ORAL_TABLET | Freq: Three times a day (TID) | ORAL | Status: DC
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
## 2014-02-20 NOTE — Patient Instructions (Signed)
If you end up with an excess amount of hydrocodone at next month visit we will reduce your monthly number of pills  Ask you orthopedist about the details of your surgery as well as any other restrictions for your activity

## 2014-02-24 ENCOUNTER — Ambulatory Visit (INDEPENDENT_AMBULATORY_CARE_PROVIDER_SITE_OTHER): Payer: Medicare Other

## 2014-02-24 DIAGNOSIS — J309 Allergic rhinitis, unspecified: Secondary | ICD-10-CM

## 2014-03-02 ENCOUNTER — Ambulatory Visit (INDEPENDENT_AMBULATORY_CARE_PROVIDER_SITE_OTHER): Payer: Medicare Other

## 2014-03-02 DIAGNOSIS — J309 Allergic rhinitis, unspecified: Secondary | ICD-10-CM

## 2014-03-03 ENCOUNTER — Ambulatory Visit: Payer: Medicare Other

## 2014-03-17 ENCOUNTER — Ambulatory Visit (INDEPENDENT_AMBULATORY_CARE_PROVIDER_SITE_OTHER): Payer: Medicare Other

## 2014-03-17 ENCOUNTER — Encounter: Payer: Self-pay | Admitting: Registered Nurse

## 2014-03-17 ENCOUNTER — Other Ambulatory Visit: Payer: Self-pay | Admitting: Physical Medicine & Rehabilitation

## 2014-03-17 ENCOUNTER — Encounter: Payer: Medicare Other | Attending: Physical Medicine & Rehabilitation | Admitting: Registered Nurse

## 2014-03-17 VITALS — BP 133/66 | HR 70 | Resp 14 | Wt 285.0 lb

## 2014-03-17 DIAGNOSIS — M549 Dorsalgia, unspecified: Secondary | ICD-10-CM

## 2014-03-17 DIAGNOSIS — J309 Allergic rhinitis, unspecified: Secondary | ICD-10-CM

## 2014-03-17 DIAGNOSIS — Z5181 Encounter for therapeutic drug level monitoring: Secondary | ICD-10-CM

## 2014-03-17 DIAGNOSIS — M47817 Spondylosis without myelopathy or radiculopathy, lumbosacral region: Secondary | ICD-10-CM

## 2014-03-17 DIAGNOSIS — Z79899 Other long term (current) drug therapy: Secondary | ICD-10-CM

## 2014-03-17 DIAGNOSIS — G8929 Other chronic pain: Secondary | ICD-10-CM

## 2014-03-17 DIAGNOSIS — S8410XA Injury of peroneal nerve at lower leg level, unspecified leg, initial encounter: Secondary | ICD-10-CM

## 2014-03-17 MED ORDER — GABAPENTIN 300 MG PO CAPS: ORAL_CAPSULE | ORAL | Status: DC

## 2014-03-17 MED ORDER — HYDROCODONE-ACETAMINOPHEN 10-325 MG PO TABS: 1.0000 | ORAL_TABLET | Freq: Four times a day (QID) | ORAL | Status: DC | PRN

## 2014-03-17 MED ORDER — CELECOXIB 200 MG PO CAPS: 200.0000 mg | ORAL_CAPSULE | Freq: Every day | ORAL | Status: DC
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
## 2014-03-24 ENCOUNTER — Ambulatory Visit (INDEPENDENT_AMBULATORY_CARE_PROVIDER_SITE_OTHER): Payer: Medicare Other

## 2014-03-24 DIAGNOSIS — J309 Allergic rhinitis, unspecified: Secondary | ICD-10-CM

## 2014-03-27 NOTE — Progress Notes (Signed)
Urine drug screen from 03/17/2014 was consistent. 

## 2014-03-28 ENCOUNTER — Encounter: Payer: Self-pay | Admitting: Internal Medicine

## 2014-03-28 ENCOUNTER — Ambulatory Visit (INDEPENDENT_AMBULATORY_CARE_PROVIDER_SITE_OTHER): Payer: Medicare Other | Admitting: Internal Medicine

## 2014-03-28 VITALS — BP 132/80 | HR 64 | Ht 75.0 in | Wt 286.2 lb

## 2014-03-28 DIAGNOSIS — J45998 Other asthma: Secondary | ICD-10-CM

## 2014-03-28 DIAGNOSIS — K219 Gastro-esophageal reflux disease without esophagitis: Secondary | ICD-10-CM

## 2014-03-28 DIAGNOSIS — J301 Allergic rhinitis due to pollen: Secondary | ICD-10-CM

## 2014-03-28 DIAGNOSIS — K224 Dyskinesia of esophagus: Secondary | ICD-10-CM

## 2014-03-28 DIAGNOSIS — J45909 Unspecified asthma, uncomplicated: Secondary | ICD-10-CM

## 2014-03-28 MED ORDER — UMECLIDINIUM-VILANTEROL 62.5-25 MCG/INH IN AEPB: INHALATION_SPRAY | RESPIRATORY_TRACT | Status: DC

## 2014-03-28 MED ORDER — AZELASTINE-FLUTICASONE 137-50 MCG/ACT NA SUSP: NASAL | Status: DC

## 2014-03-28 NOTE — Assessment & Plan Note (Signed)
Continues allergy vaccine Plan- Try Dymista insteadx of occasional Flonase

## 2014-03-28 NOTE — Progress Notes (Signed)
Patient ID: OLIVE MOTYKA, male    DOB: 09/18/1951, 63 y.o.   MRN: [id number]  HPI 63 yo never smoker followed for allergic rhinitis and allergic asthma, complicated by chronic musculoskeletal pain after an injury. Last here August 16, 2010. That note reviewed. He got through the winter ok, still limited by neurologic complaints. He reports a change in cough, deeper with some tightness over last 4 months. No change in perennial sinus drainage and occasional hoarseness. He continues allergy vaccine here at 1:10. 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 He continues Advair 250, reporting no difference with Advair 500 or with Dulera. Nasal sprays seem to work.  Needs refill tessalon, epipen and flonase.   09/19/11-63 yo never smoker followed for allergic rhinitis and allergic asthma, complicated by chronic musculoskeletal pain after an injury. He has noted cough and wheeze more often with cool weather, and some shortness of breath which may be worse. Nothing dramatic. Singing triggers chest tightness. Cold air makes his nose run. Pain management has told him his substernal pain is related to his airways although I have felt it was more likely musculoskeletal. He has been using Advair 250 but wants to try going back to Advair 500 which we discussed.  03/19/12- 63 yo never smoker followed for allergic rhinitis and allergic asthma, complicated by chronic musculoskeletal pain after an injury. Coughing-productive-gray in color; still on vaccine He blames pain in his legs today increased blood pressure. Continues allergy vaccine "not a bad spring". 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 He chose to stay on Advair 500. We discussed the effect of a dry powder steroid inhaler on vocal quality. Rarely needs a rescue inhaler. We discussed trying Dulera again with a spacer to see if that would help his throat.  07/02/12-  63 yo never smoker followed for allergic rhinitis and allergic  asthma, complicated by chronic musculoskeletal pain after an injury. ACUTE visit- stung yesterday on right temple and back of right hand by yellow jackets. Today has tense warm edema across dorsum of right hand and smaller, similar area R temple. No systemic reaction. Plan- depomedrol, sample allegra, cool compresses.   09/24/12-   63 yo never smoker followed for allergic rhinitis and allergic asthma, complicated by chronic musculoskeletal pain after an injury. FOLLOWS FOR: states that he has been having "coughing attacks" for about 3 months. He vomits when he coughs too much. He describes hard coughing paroxysms, wet or dry cough, scant clear mucus. Not clear if he refluxes first but sometimes coughing leads to retching. Also not clear what triggers this cough. He complains of a sense of persistent mild chest congestion at rest and says breathing is "an effort" with occasional wheeze noted. He continues to be followed at pain management where he says oxygen saturation sometimes gets into the 80s there. He preferred Advair after trying Dulera and Symbicort. He describes history of what may have been systemic reaction to multiple yellow jacket stings in the past. Details unclear.  03/24/13- 63 yo never smoker followed for allergic rhinitis and allergic asthma, complicated by chronic musculoskeletal pain after an injury. FOLLOWS FOR: pain in center chest area-doesn't feel full like congestion.   Still on allergy vaccine 1:10 GH and no reactions.  Blames spring pollen for some increase in his chronic sense of right upper anterior chest discomfort, noticed mostly while mowing his lawn with a self-propelled mower. Discomfort is increased with deep breath and he says it is  clearly related to his breathing, not the amount of physical effort. He continues chronic pain clinic followup, polyneuropathy. CXR 09/28/12 IMPRESSION:  1. Hyperexpanded lungs and chronic bronchitic change without  definite acute  cardiopulmonary disease.  2. Borderline enlarged cardiac silhouette with prominence of the  central pulmonary vasculature, nonspecific but may be seen in the  setting of pulmonary arterial hypertension. Further evaluation  with cardiac echo may be performed as clinically indicated.  Original Report Authenticated By: Jake Seats, MD 2D Echo-09/29/12-EF 65-70, mild LVH, grade 1 diastolic dysfunction, mild bi-atrial dilatation without pulmonary hypertension.  09/29/13- 63 yo never smoker followed for allergic rhinitis and allergic asthma, complicated by chronic musculoskeletal pain after an injury. FOLLOWS FOR: still on Allergy vaccine 1:10 GH and doing well; has had flare up of congestion,sore throat, drainage-took Allegra and helped. Needs refills on all medications. Had flu vaccine but worries that he might have the flu because of 2 or 3 days of watery rhinorrhea, sore throat, dry cough and sneeze. Low-grade fever.  03/28/14- 63 yo never smoker followed for allergic rhinitis and allergic asthma/ bronchitis, complicated by chronic musculoskeletal pain after an injury, GERD. FOLLOWS FOR: still on vaccine and wonders if it helps but does not want to come off of it out of fear that he will get bad. Runny nose, itchy and watery eyes. Allergy vaccine 1:10 GH Morning cough and postnasal drip bothering more now than during Spring pollen. Some wheeze or spasm upper substernal area. Occasional  Food hangup upper third esophagus. Strangles easily.  Increased pain meds after R shoulder surgery (? Intubation for GOT).   ROS-see HPI Constitutional:   No-   weight loss, night sweats, fevers, chills, fatigue, lassitude. HEENT:   No-  headaches, difficulty swallowing, tooth/dental problems, +sore throat,       +sneezing, itching, ear ache, nasal congestion, +post nasal drip,  CV:  No-chest pain, no-orthopnea, PND, swelling in lower extremities, anasarca,  dizziness, palpitations Resp: No-   shortness of breath  with exertion or at rest.              No-   productive cough,  + non-productive cough,  No- coughing up of blood.              No-   change in color of mucus.  +wheezing.   Skin: No-   rash or lesions. GI:  No-   heartburn, indigestion, abdominal pain, nausea, vomiting,  GU: . MS: +  joint pain or swelling.  No- decreased range of motion.  + back pain. Neuro-     nothing unusual Psych:  No- change in mood or affect. + depression or anxiety.  No memory loss.  OBJ- Physical Exam General- Alert, Oriented, Affect-appropriate, Distress- none acute. Big/ muscular Skin- rash-none, lesions- none, excoriation- none Lymphadenopathy- none Head- atraumatic            Eyes- Gross vision intact, PERRLA, conjunctivae and secretions clear            Ears- Hearing, canals-normal            Nose- Clear, no-Septal dev, mucus, polyps, erosion, perforation             Throat- Mallampati II , mucosa clear(throat lozenges) , drainage- none, tonsils- atrophic Neck- flexible , trachea midline, no stridor , thyroid nl, carotid no bruit Chest - symmetrical excursion , unlabored           Heart/CV- RRR , no murmur , no gallop  ,  no rub, nl s1 s2                           - JVD- none , edema- none, stasis changes- none, varices- none           Lung- clear to P&A, wheeze- none, cough+loose bronchitic , dullness-none, rub- none           Chest wall- not obviously tender Abd-  Br/ Gen/ Rectal- Not done, not indicated Extrem- cyanosis- none, clubbing, none, atrophy- none, strength- muscular. +splint L lower leg. Neuro- grossly intact to observation

## 2014-03-28 NOTE — Patient Instructions (Addendum)
Sample Anoro inhaler- Try one puff ONCE daily instead of Advair. When the Anoro sample runs out, go back to Advair or call for script.  Sample Dymista nasal spray   1-2  puffs each nostril once daily at bedtime   Try this instead of Flonase  Order- Standard Barium swallow- question esophageal dysmotility, reflux
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
## 2014-03-28 NOTE — Assessment & Plan Note (Signed)
Plan- Try Anoro instead of Advarir 500, for comparison

## 2014-03-29 ENCOUNTER — Ambulatory Visit (INDEPENDENT_AMBULATORY_CARE_PROVIDER_SITE_OTHER): Payer: Medicare Other

## 2014-03-29 DIAGNOSIS — J309 Allergic rhinitis, unspecified: Secondary | ICD-10-CM

## 2014-03-30 ENCOUNTER — Ambulatory Visit (INDEPENDENT_AMBULATORY_CARE_PROVIDER_SITE_OTHER): Payer: Medicare Other

## 2014-03-30 DIAGNOSIS — J309 Allergic rhinitis, unspecified: Secondary | ICD-10-CM

## 2014-03-31 ENCOUNTER — Ambulatory Visit: Payer: Medicare Other

## 2014-04-04 ENCOUNTER — Ambulatory Visit (HOSPITAL_COMMUNITY)
Admission: RE | Admit: 2014-04-04 | Discharge: 2014-04-04 | Disposition: A | Discharge status: Home / Self Care | Payer: Medicare Other | Source: Ambulatory Visit | Attending: Internal Medicine | Admitting: Internal Medicine

## 2014-04-04 DIAGNOSIS — K224 Dyskinesia of esophagus: Secondary | ICD-10-CM

## 2014-04-04 DIAGNOSIS — K219 Gastro-esophageal reflux disease without esophagitis: Secondary | ICD-10-CM | POA: Insufficient documentation

## 2014-04-04 DIAGNOSIS — R1310 Dysphagia, unspecified: Secondary | ICD-10-CM | POA: Insufficient documentation

## 2014-04-07 ENCOUNTER — Ambulatory Visit (INDEPENDENT_AMBULATORY_CARE_PROVIDER_SITE_OTHER): Payer: Medicare Other

## 2014-04-07 DIAGNOSIS — J309 Allergic rhinitis, unspecified: Secondary | ICD-10-CM

## 2014-04-14 ENCOUNTER — Ambulatory Visit (INDEPENDENT_AMBULATORY_CARE_PROVIDER_SITE_OTHER): Payer: Medicare Other

## 2014-04-14 DIAGNOSIS — J309 Allergic rhinitis, unspecified: Secondary | ICD-10-CM

## 2014-04-20 ENCOUNTER — Encounter: Payer: Medicare Other | Attending: Physical Medicine & Rehabilitation | Admitting: Registered Nurse

## 2014-04-20 ENCOUNTER — Encounter: Payer: Self-pay | Admitting: Registered Nurse

## 2014-04-20 VITALS — BP 132/77 | HR 60 | Resp 16 | Ht 75.0 in | Wt 285.0 lb

## 2014-04-20 DIAGNOSIS — G8929 Other chronic pain: Secondary | ICD-10-CM | POA: Insufficient documentation

## 2014-04-20 DIAGNOSIS — M47817 Spondylosis without myelopathy or radiculopathy, lumbosacral region: Secondary | ICD-10-CM

## 2014-04-20 DIAGNOSIS — IMO0002 Reserved for concepts with insufficient information to code with codable children: Secondary | ICD-10-CM

## 2014-04-20 DIAGNOSIS — S8410XS Injury of peroneal nerve at lower leg level, unspecified leg, sequela: Secondary | ICD-10-CM

## 2014-04-20 DIAGNOSIS — Z5181 Encounter for therapeutic drug level monitoring: Secondary | ICD-10-CM | POA: Insufficient documentation

## 2014-04-20 DIAGNOSIS — M549 Dorsalgia, unspecified: Secondary | ICD-10-CM

## 2014-04-20 DIAGNOSIS — Z79899 Other long term (current) drug therapy: Secondary | ICD-10-CM

## 2014-04-20 DIAGNOSIS — S8490XS Injury of unspecified nerve at lower leg level, unspecified leg, sequela: Secondary | ICD-10-CM

## 2014-04-20 MED ORDER — CELECOXIB 200 MG PO CAPS: 200.0000 mg | ORAL_CAPSULE | Freq: Every day | ORAL | Status: DC

## 2014-04-20 MED ORDER — GABAPENTIN 300 MG PO CAPS: ORAL_CAPSULE | ORAL | Status: DC

## 2014-04-20 MED ORDER — HYDROCODONE-ACETAMINOPHEN 10-325 MG PO TABS: 1.0000 | ORAL_TABLET | Freq: Four times a day (QID) | ORAL | Status: DC | PRN
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
## 2014-04-21 ENCOUNTER — Ambulatory Visit (INDEPENDENT_AMBULATORY_CARE_PROVIDER_SITE_OTHER): Payer: Medicare Other

## 2014-04-21 DIAGNOSIS — J309 Allergic rhinitis, unspecified: Secondary | ICD-10-CM

## 2014-04-27 ENCOUNTER — Ambulatory Visit (INDEPENDENT_AMBULATORY_CARE_PROVIDER_SITE_OTHER): Payer: Medicare Other

## 2014-04-27 DIAGNOSIS — J309 Allergic rhinitis, unspecified: Secondary | ICD-10-CM

## 2014-05-05 ENCOUNTER — Ambulatory Visit (INDEPENDENT_AMBULATORY_CARE_PROVIDER_SITE_OTHER): Payer: Medicare Other

## 2014-05-05 DIAGNOSIS — J309 Allergic rhinitis, unspecified: Secondary | ICD-10-CM

## 2014-05-12 ENCOUNTER — Ambulatory Visit (INDEPENDENT_AMBULATORY_CARE_PROVIDER_SITE_OTHER): Payer: Medicare Other

## 2014-05-12 DIAGNOSIS — J309 Allergic rhinitis, unspecified: Secondary | ICD-10-CM

## 2014-05-18 ENCOUNTER — Encounter: Payer: Medicare Other | Attending: Physical Medicine & Rehabilitation | Admitting: Registered Nurse

## 2014-05-18 ENCOUNTER — Encounter: Payer: Self-pay | Admitting: Registered Nurse

## 2014-05-18 VITALS — BP 125/73 | HR 78 | Resp 14 | Wt 275.6 lb

## 2014-05-18 DIAGNOSIS — M549 Dorsalgia, unspecified: Secondary | ICD-10-CM | POA: Diagnosis present

## 2014-05-18 DIAGNOSIS — S8490XS Injury of unspecified nerve at lower leg level, unspecified leg, sequela: Secondary | ICD-10-CM

## 2014-05-18 DIAGNOSIS — Z5181 Encounter for therapeutic drug level monitoring: Secondary | ICD-10-CM

## 2014-05-18 DIAGNOSIS — Z79899 Other long term (current) drug therapy: Secondary | ICD-10-CM | POA: Diagnosis present

## 2014-05-18 DIAGNOSIS — S346XXS Injury of peripheral nerve(s) at abdomen, lower back and pelvis level, sequela: Secondary | ICD-10-CM

## 2014-05-18 DIAGNOSIS — IMO0002 Reserved for concepts with insufficient information to code with codable children: Secondary | ICD-10-CM

## 2014-05-18 DIAGNOSIS — M47817 Spondylosis without myelopathy or radiculopathy, lumbosacral region: Secondary | ICD-10-CM

## 2014-05-18 DIAGNOSIS — G8929 Other chronic pain: Secondary | ICD-10-CM | POA: Diagnosis present

## 2014-05-18 DIAGNOSIS — S8410XS Injury of peroneal nerve at lower leg level, unspecified leg, sequela: Secondary | ICD-10-CM

## 2014-05-18 MED ORDER — CELECOXIB 200 MG PO CAPS: 200.0000 mg | ORAL_CAPSULE | Freq: Every day | ORAL | Status: DC

## 2014-05-18 MED ORDER — HYDROCODONE-ACETAMINOPHEN 10-325 MG PO TABS: 1.0000 | ORAL_TABLET | Freq: Four times a day (QID) | ORAL | Status: DC | PRN
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
## 2014-05-19 ENCOUNTER — Ambulatory Visit (INDEPENDENT_AMBULATORY_CARE_PROVIDER_SITE_OTHER): Payer: Medicare Other

## 2014-05-19 DIAGNOSIS — J309 Allergic rhinitis, unspecified: Secondary | ICD-10-CM

## 2014-05-26 ENCOUNTER — Ambulatory Visit (INDEPENDENT_AMBULATORY_CARE_PROVIDER_SITE_OTHER): Payer: Medicare Other

## 2014-05-26 DIAGNOSIS — J309 Allergic rhinitis, unspecified: Secondary | ICD-10-CM

## 2014-06-02 ENCOUNTER — Ambulatory Visit (INDEPENDENT_AMBULATORY_CARE_PROVIDER_SITE_OTHER): Payer: Medicare Other

## 2014-06-02 DIAGNOSIS — J309 Allergic rhinitis, unspecified: Secondary | ICD-10-CM

## 2014-06-06 ENCOUNTER — Encounter: Payer: Self-pay | Admitting: Internal Medicine

## 2014-06-09 ENCOUNTER — Ambulatory Visit (INDEPENDENT_AMBULATORY_CARE_PROVIDER_SITE_OTHER): Payer: Medicare Other

## 2014-06-09 DIAGNOSIS — J309 Allergic rhinitis, unspecified: Secondary | ICD-10-CM

## 2014-06-15 ENCOUNTER — Encounter: Payer: Medicare Other | Admitting: Registered Nurse

## 2014-06-15 ENCOUNTER — Other Ambulatory Visit: Payer: Self-pay

## 2014-06-15 ENCOUNTER — Ambulatory Visit (INDEPENDENT_AMBULATORY_CARE_PROVIDER_SITE_OTHER): Payer: Medicare Other

## 2014-06-15 DIAGNOSIS — J309 Allergic rhinitis, unspecified: Secondary | ICD-10-CM

## 2014-06-15 MED ORDER — HYDROCODONE-ACETAMINOPHEN 10-325 MG PO TABS: 1.0000 | ORAL_TABLET | Freq: Four times a day (QID) | ORAL | Status: DC | PRN

## 2014-06-15 NOTE — Telephone Encounter (Signed)
Patient was late for appt. Appt was rescheduled. RX printed for [place] to sign.

## 2014-06-20 ENCOUNTER — Encounter: Payer: Self-pay | Admitting: Registered Nurse

## 2014-06-20 ENCOUNTER — Encounter: Payer: Medicare Other | Attending: Physical Medicine & Rehabilitation | Admitting: Registered Nurse

## 2014-06-20 VITALS — BP 137/68 | HR 65 | Resp 14 | Wt 268.0 lb

## 2014-06-20 DIAGNOSIS — M549 Dorsalgia, unspecified: Secondary | ICD-10-CM

## 2014-06-20 DIAGNOSIS — Z5181 Encounter for therapeutic drug level monitoring: Secondary | ICD-10-CM

## 2014-06-20 DIAGNOSIS — Z79899 Other long term (current) drug therapy: Secondary | ICD-10-CM | POA: Diagnosis present

## 2014-06-20 DIAGNOSIS — M47817 Spondylosis without myelopathy or radiculopathy, lumbosacral region: Secondary | ICD-10-CM

## 2014-06-20 DIAGNOSIS — IMO0002 Reserved for concepts with insufficient information to code with codable children: Secondary | ICD-10-CM

## 2014-06-20 DIAGNOSIS — G8929 Other chronic pain: Secondary | ICD-10-CM | POA: Diagnosis present

## 2014-06-20 DIAGNOSIS — S8410XS Injury of peroneal nerve at lower leg level, unspecified leg, sequela: Secondary | ICD-10-CM

## 2014-06-20 DIAGNOSIS — S346XXS Injury of peripheral nerve(s) at abdomen, lower back and pelvis level, sequela: Secondary | ICD-10-CM

## 2014-06-20 MED ORDER — GABAPENTIN 300 MG PO CAPS: ORAL_CAPSULE | ORAL | Status: DC
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
## 2014-06-23 ENCOUNTER — Ambulatory Visit (INDEPENDENT_AMBULATORY_CARE_PROVIDER_SITE_OTHER): Payer: Medicare Other

## 2014-06-23 DIAGNOSIS — J309 Allergic rhinitis, unspecified: Secondary | ICD-10-CM

## 2014-06-30 ENCOUNTER — Ambulatory Visit (INDEPENDENT_AMBULATORY_CARE_PROVIDER_SITE_OTHER): Payer: Medicare Other

## 2014-06-30 DIAGNOSIS — J309 Allergic rhinitis, unspecified: Secondary | ICD-10-CM

## 2014-07-07 ENCOUNTER — Ambulatory Visit (INDEPENDENT_AMBULATORY_CARE_PROVIDER_SITE_OTHER): Payer: Medicare Other

## 2014-07-07 DIAGNOSIS — J309 Allergic rhinitis, unspecified: Secondary | ICD-10-CM

## 2014-07-14 ENCOUNTER — Ambulatory Visit (INDEPENDENT_AMBULATORY_CARE_PROVIDER_SITE_OTHER): Payer: Medicare Other

## 2014-07-14 DIAGNOSIS — J309 Allergic rhinitis, unspecified: Secondary | ICD-10-CM

## 2014-07-20 ENCOUNTER — Encounter: Payer: Medicare Other | Attending: Physical Medicine & Rehabilitation | Admitting: Registered Nurse

## 2014-07-20 ENCOUNTER — Ambulatory Visit (INDEPENDENT_AMBULATORY_CARE_PROVIDER_SITE_OTHER): Payer: Medicare Other

## 2014-07-20 ENCOUNTER — Encounter: Payer: Self-pay | Admitting: Registered Nurse

## 2014-07-20 VITALS — BP 161/86 | HR 68 | Resp 14 | Wt 264.0 lb

## 2014-07-20 DIAGNOSIS — Z5181 Encounter for therapeutic drug level monitoring: Secondary | ICD-10-CM | POA: Insufficient documentation

## 2014-07-20 DIAGNOSIS — G8929 Other chronic pain: Secondary | ICD-10-CM | POA: Diagnosis present

## 2014-07-20 DIAGNOSIS — S8410XS Injury of peroneal nerve at lower leg level, unspecified leg, sequela: Secondary | ICD-10-CM

## 2014-07-20 DIAGNOSIS — M47817 Spondylosis without myelopathy or radiculopathy, lumbosacral region: Secondary | ICD-10-CM

## 2014-07-20 DIAGNOSIS — Z79899 Other long term (current) drug therapy: Secondary | ICD-10-CM | POA: Insufficient documentation

## 2014-07-20 DIAGNOSIS — IMO0002 Reserved for concepts with insufficient information to code with codable children: Secondary | ICD-10-CM

## 2014-07-20 DIAGNOSIS — S346XXS Injury of peripheral nerve(s) at abdomen, lower back and pelvis level, sequela: Secondary | ICD-10-CM

## 2014-07-20 DIAGNOSIS — J309 Allergic rhinitis, unspecified: Secondary | ICD-10-CM

## 2014-07-20 DIAGNOSIS — M549 Dorsalgia, unspecified: Secondary | ICD-10-CM | POA: Insufficient documentation

## 2014-07-20 MED ORDER — CELECOXIB 200 MG PO CAPS: 200.0000 mg | ORAL_CAPSULE | Freq: Every day | ORAL | Status: DC

## 2014-07-20 MED ORDER — HYDROCODONE-ACETAMINOPHEN 10-325 MG PO TABS: 1.0000 | ORAL_TABLET | Freq: Four times a day (QID) | ORAL | Status: DC | PRN
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
## 2014-07-24 ENCOUNTER — Ambulatory Visit (INDEPENDENT_AMBULATORY_CARE_PROVIDER_SITE_OTHER): Payer: Medicare Other

## 2014-07-24 DIAGNOSIS — J309 Allergic rhinitis, unspecified: Secondary | ICD-10-CM

## 2014-07-28 ENCOUNTER — Ambulatory Visit (INDEPENDENT_AMBULATORY_CARE_PROVIDER_SITE_OTHER): Payer: Medicare Other

## 2014-07-28 DIAGNOSIS — J309 Allergic rhinitis, unspecified: Secondary | ICD-10-CM

## 2014-07-31 ENCOUNTER — Encounter: Payer: Self-pay | Admitting: Gastroenterology

## 2014-07-31 ENCOUNTER — Encounter: Payer: Self-pay | Admitting: Internal Medicine

## 2014-07-31 ENCOUNTER — Ambulatory Visit (INDEPENDENT_AMBULATORY_CARE_PROVIDER_SITE_OTHER): Payer: Medicare Other | Admitting: Internal Medicine

## 2014-07-31 VITALS — BP 120/82 | HR 65 | Ht 74.0 in | Wt 271.2 lb

## 2014-07-31 DIAGNOSIS — J45998 Other asthma: Secondary | ICD-10-CM

## 2014-07-31 DIAGNOSIS — K219 Gastro-esophageal reflux disease without esophagitis: Secondary | ICD-10-CM

## 2014-07-31 DIAGNOSIS — Z23 Encounter for immunization: Secondary | ICD-10-CM

## 2014-07-31 DIAGNOSIS — J301 Allergic rhinitis due to pollen: Secondary | ICD-10-CM

## 2014-07-31 NOTE — Assessment & Plan Note (Signed)
Continues allergy vaccine 1:10

## 2014-07-31 NOTE — Assessment & Plan Note (Signed)
Currently well controlled Plan-flu vaccine

## 2014-07-31 NOTE — Progress Notes (Signed)
Patient ID: Kristopher Gay, male    DOB: 09/18/1951, 63 y.o.   MRN: [id number]  HPI 63 yo never smoker followed for allergic rhinitis and allergic asthma, complicated by chronic musculoskeletal pain after an injury. Last here August 16, 2010. That note reviewed. He got through the winter ok, still limited by neurologic complaints. He reports a change in cough, deeper with some tightness over last 4 months. No change in perennial sinus drainage and occasional hoarseness. He continues allergy vaccine here at 1:10. 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 He continues Advair 250, reporting no difference with Advair 500 or with Dulera. Nasal sprays seem to work.  Needs refill tessalon, epipen and flonase.   09/19/11-59 yo never smoker followed for allergic rhinitis and allergic asthma, complicated by chronic musculoskeletal pain after an injury. He has noted cough and wheeze more often with cool weather, and some shortness of breath which may be worse. Nothing dramatic. Singing triggers chest tightness. Cold air makes his nose run. Pain management has told him his substernal pain is related to his airways although I have felt it was more likely musculoskeletal. He has been using Advair 250 but wants to try going back to Advair 500 which we discussed.  03/19/12- 81 yo never smoker followed for allergic rhinitis and allergic asthma, complicated by chronic musculoskeletal pain after an injury. Coughing-productive-gray in color; still on vaccine He blames pain in his legs today increased blood pressure. Continues allergy vaccine "not a bad spring". 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 He chose to stay on Advair 500. We discussed the effect of a dry powder steroid inhaler on vocal quality. Rarely needs a rescue inhaler. We discussed trying Dulera again with a spacer to see if that would help his throat.  07/02/12-  45 yo never smoker followed for allergic rhinitis and allergic  asthma, complicated by chronic musculoskeletal pain after an injury. ACUTE visit- stung yesterday on right temple and back of right hand by yellow jackets. Today has tense warm edema across dorsum of right hand and smaller, similar area R temple. No systemic reaction. Plan- depomedrol, sample allegra, cool compresses.   09/24/12-   45 yo never smoker followed for allergic rhinitis and allergic asthma, complicated by chronic musculoskeletal pain after an injury. FOLLOWS FOR: states that he has been having "coughing attacks" for about 3 months. He vomits when he coughs too much. He describes hard coughing paroxysms, wet or dry cough, scant clear mucus. Not clear if he refluxes first but sometimes coughing leads to retching. Also not clear what triggers this cough. He complains of a sense of persistent mild chest congestion at rest and says breathing is "an effort" with occasional wheeze noted. He continues to be followed at pain management where he says oxygen saturation sometimes gets into the 80s there. He preferred Advair after trying Dulera and Symbicort. He describes history of what may have been systemic reaction to multiple yellow jacket stings in the past. Details unclear.  03/24/13- 46 yo never smoker followed for allergic rhinitis and allergic asthma, complicated by chronic musculoskeletal pain after an injury. FOLLOWS FOR: pain in center chest area-doesn't feel full like congestion.   Still on allergy vaccine 1:10 GH and no reactions.  Blames spring pollen for some increase in his chronic sense of right upper anterior chest discomfort, noticed mostly while mowing his lawn with a self-propelled mower. Discomfort is increased with deep breath and he says it is  clearly related to his breathing, not the amount of physical effort. He continues chronic pain clinic followup, polyneuropathy. CXR 09/28/12 IMPRESSION:  1. Hyperexpanded lungs and chronic bronchitic change without  definite acute  cardiopulmonary disease.  2. Borderline enlarged cardiac silhouette with prominence of the  central pulmonary vasculature, nonspecific but may be seen in the  setting of pulmonary arterial hypertension. Further evaluation  with cardiac echo may be performed as clinically indicated.  Original Report Authenticated By: Jake Seats, MD 2D Echo-09/29/12-EF 65-70, mild LVH, grade 1 diastolic dysfunction, mild bi-atrial dilatation without pulmonary hypertension.  09/29/13- 69 yo never smoker followed for allergic rhinitis and allergic asthma, complicated by chronic musculoskeletal pain after an injury. FOLLOWS FOR: still on Allergy vaccine 1:10 GH and doing well; has had flare up of congestion,sore throat, drainage-took Allegra and helped. Needs refills on all medications. Had flu vaccine but worries that he might have the flu because of 2 or 3 days of watery rhinorrhea, sore throat, dry cough and sneeze. Low-grade fever.  03/28/14- 65 yo never smoker followed for allergic rhinitis and allergic asthma/ bronchitis, complicated by chronic musculoskeletal pain after an injury, GERD. FOLLOWS FOR: still on vaccine and wonders if it helps but does not want to come off of it out of fear that he will get bad. Runny nose, itchy and watery eyes. Allergy vaccine 1:10 GH Morning cough and postnasal drip bothering more now than during Spring pollen. Some wheeze or spasm upper substernal area. Occasional  Food hangup upper third esophagus. Strangles easily.  Increased pain meds after R shoulder surgery (? Intubation for GOT).   07/31/14-63 yo never smoker followed for allergic rhinitis and allergic asthma/ bronchitis, complicated by chronic musculoskeletal pain after an injury, GERD. FOLLOW FOR: coughing up yellow mucus, chest pain/tightness - had barium swallow done in June 2015, was diagnosed with GERD, still having trouble with the chest tightness despite taking Omeprazole. Substernal discomfort, sometimes feels like  reflux, present much of the time but worse with overexertion and bending. Omeprazole was insufficient help. Barium Swallow 04/04/14 IMPRESSION:  1. No esophageal stricture or upper esophageal web. Mild dysmotility  distal esophagus with tertiary contractions. No hiatal hernia.  2. Moderate gastroesophageal reflux to the level of mid esophagus.  Electronically Signed  By: Lahoma Crocker M.D.  On: 04/04/2014 14:51   ROS-see HPI Constitutional:   No-   weight loss, night sweats, fevers, chills, fatigue, lassitude. HEENT:   No-  headaches, difficulty swallowing, tooth/dental problems, +sore throat,       +sneezing, itching, ear ache, nasal congestion, +post nasal drip,  CV:  +chest pain, no-orthopnea, PND, swelling in lower extremities, anasarca,  dizziness, palpitations Resp: No-   shortness of breath with exertion or at rest.              No-   productive cough,  + non-productive cough,  No- coughing up of blood.              No-   change in color of mucus.  +wheezing.   Skin: No-   rash or lesions. GI:  No-   heartburn, indigestion, abdominal pain, nausea, vomiting,  GU: . MS: +  joint pain or swelling.  No- decreased range of motion.  + back pain. Neuro-     nothing unusual Psych:  No- change in mood or affect. + depression or anxiety.  No memory loss.  OBJ- Physical Exam General- Alert, Oriented, Affect-appropriate, Distress- none acute. Big/ muscular Skin- rash-none, lesions- none,  excoriation- none Lymphadenopathy- none Head- atraumatic            Eyes- Gross vision intact, PERRLA, conjunctivae and secretions clear            Ears- Hearing, canals-normal            Nose- Clear, no-Septal dev, mucus, polyps, erosion, perforation             Throat- Mallampati II , mucosa clear(throat lozenges) , drainage- none, tonsils- atrophic Neck- flexible , trachea midline, no stridor , thyroid nl, carotid no bruit Chest - symmetrical excursion , unlabored           Heart/CV- RRR , no murmur , no  gallop  , no rub, nl s1 s2                           - JVD- none , edema- none, stasis changes- none, varices- none           Lung- + coarse, wheeze- none, cough+slight , dullness-none, rub- none           Chest wall- not obviously tender Abd-  Br/ Gen/ Rectal- Not done, not indicated Extrem- cyanosis- none, clubbing, none, atrophy- none, strength- muscular. +splint L lower leg. Neuro- grossly intact to observation

## 2014-07-31 NOTE — Patient Instructions (Signed)
Order- flu vax  Order- referral to Gastroenterology- dx GERD   For the gastro-esophageal reflux "GERD"- try not to lie down for an hour after you eat                                                                       See if you can elevate the head of your bed about 2-3 inches relative to the foot of the bed. We want the slope to help keep stomach acid down in your stomach

## 2014-07-31 NOTE — Assessment & Plan Note (Signed)
Barium swallow definitely demonstrates reflux which may be the basis for his substernal discomfort and an important trigger of occasional asthmatic bronchitis. Short trial of OTC omeprazole did not help. Plan- referral to gastroenterology for evaluation of GERD confirmed on barium swallow and associated with persistent substernal discomfort and occasional exacerbations of asthmatic bronchitis

## 2014-08-04 ENCOUNTER — Ambulatory Visit (INDEPENDENT_AMBULATORY_CARE_PROVIDER_SITE_OTHER): Payer: Medicare Other

## 2014-08-04 DIAGNOSIS — J309 Allergic rhinitis, unspecified: Secondary | ICD-10-CM

## 2014-08-11 ENCOUNTER — Ambulatory Visit (INDEPENDENT_AMBULATORY_CARE_PROVIDER_SITE_OTHER): Payer: Medicare Other

## 2014-08-11 DIAGNOSIS — J309 Allergic rhinitis, unspecified: Secondary | ICD-10-CM

## 2014-08-18 ENCOUNTER — Encounter: Payer: Medicare Other | Attending: Physical Medicine & Rehabilitation | Admitting: *Deleted

## 2014-08-18 ENCOUNTER — Other Ambulatory Visit: Payer: Self-pay | Admitting: *Deleted

## 2014-08-18 ENCOUNTER — Ambulatory Visit (INDEPENDENT_AMBULATORY_CARE_PROVIDER_SITE_OTHER): Payer: Medicare Other

## 2014-08-18 ENCOUNTER — Encounter: Payer: Medicare Other | Admitting: Registered Nurse

## 2014-08-18 VITALS — BP 127/78 | HR 57 | Resp 14 | Ht 74.0 in | Wt 264.0 lb

## 2014-08-18 DIAGNOSIS — Z79899 Other long term (current) drug therapy: Secondary | ICD-10-CM | POA: Insufficient documentation

## 2014-08-18 DIAGNOSIS — Z5181 Encounter for therapeutic drug level monitoring: Secondary | ICD-10-CM | POA: Insufficient documentation

## 2014-08-18 DIAGNOSIS — J309 Allergic rhinitis, unspecified: Secondary | ICD-10-CM

## 2014-08-18 DIAGNOSIS — M549 Dorsalgia, unspecified: Secondary | ICD-10-CM

## 2014-08-18 DIAGNOSIS — G8929 Other chronic pain: Secondary | ICD-10-CM

## 2014-08-18 MED ORDER — HYDROCODONE-ACETAMINOPHEN 10-325 MG PO TABS: 1.0000 | ORAL_TABLET | Freq: Four times a day (QID) | ORAL | Status: DC | PRN

## 2014-08-18 NOTE — Progress Notes (Signed)
Here for pill count and medication refills. Pill count appropriate.Refill given.  VSS    Pain level: 7  No other changes.  Return in one month for refill with NP.

## 2014-08-18 NOTE — Telephone Encounter (Signed)
Narcotic rx printed for MD to sign for RN med refill visit 

## 2014-08-25 ENCOUNTER — Ambulatory Visit (INDEPENDENT_AMBULATORY_CARE_PROVIDER_SITE_OTHER): Payer: Medicare Other

## 2014-08-25 DIAGNOSIS — J309 Allergic rhinitis, unspecified: Secondary | ICD-10-CM

## 2014-08-28 ENCOUNTER — Encounter: Payer: Self-pay | Admitting: Internal Medicine

## 2014-08-30 ENCOUNTER — Encounter: Payer: Self-pay | Admitting: Internal Medicine

## 2014-09-01 ENCOUNTER — Ambulatory Visit (INDEPENDENT_AMBULATORY_CARE_PROVIDER_SITE_OTHER): Payer: Medicare Other

## 2014-09-01 DIAGNOSIS — J309 Allergic rhinitis, unspecified: Secondary | ICD-10-CM

## 2014-09-08 ENCOUNTER — Ambulatory Visit (INDEPENDENT_AMBULATORY_CARE_PROVIDER_SITE_OTHER): Payer: Medicare Other

## 2014-09-08 DIAGNOSIS — J309 Allergic rhinitis, unspecified: Secondary | ICD-10-CM

## 2014-09-14 ENCOUNTER — Encounter: Payer: Self-pay | Admitting: Gastroenterology

## 2014-09-14 ENCOUNTER — Ambulatory Visit (INDEPENDENT_AMBULATORY_CARE_PROVIDER_SITE_OTHER): Payer: Medicare Other | Admitting: Gastroenterology

## 2014-09-14 VITALS — BP 114/80 | HR 60 | Ht 73.0 in | Wt 260.1 lb

## 2014-09-14 DIAGNOSIS — Z01810 Encounter for preprocedural cardiovascular examination: Secondary | ICD-10-CM | POA: Insufficient documentation

## 2014-09-14 DIAGNOSIS — R1314 Dysphagia, pharyngoesophageal phase: Secondary | ICD-10-CM | POA: Insufficient documentation

## 2014-09-14 DIAGNOSIS — Z1211 Encounter for screening for malignant neoplasm of colon: Secondary | ICD-10-CM

## 2014-09-14 DIAGNOSIS — K219 Gastro-esophageal reflux disease without esophagitis: Secondary | ICD-10-CM

## 2014-09-14 MED ORDER — PANTOPRAZOLE SODIUM 40 MG PO TBEC: 40.0000 mg | DELAYED_RELEASE_TABLET | Freq: Every day | ORAL | Status: DC

## 2014-09-14 NOTE — Assessment & Plan Note (Signed)
Although barium swallow does not demonstrate a frank stricture, patient subjectively complains of dysphagia.  He has mild esophageal dysmotility but I do not think that's a significant factor.  Recommendations #1 upper endoscopy with dilation as indicated

## 2014-09-14 NOTE — Progress Notes (Signed)
_                                                                                                                History of Present Illness:  [name] is a pleasant 63 year old male referred for evaluation of dysphagia and heartburn.  He complains of burning chest discomfort both spontaneously and with swallowing.  He also complains of dysphagia to solids.  Barium swallow, which I reviewed, demonstrated reflux to the midportion of the esophagus and a few tertiary contractions.  No frank stricture was seen.  He took omeprazole with minimal improvement.  He is currently on no medication.  He's been on Celebrex over the past year.  He also complains of occasional spasm in the muscles of his chest.   Past Medical History  Diagnosis Date  . Unspecified asthma(493.90)   . Allergic rhinitis, cause unspecified   . Extrinsic asthma, unspecified   . DDD (degenerative disc disease)   . Neuromuscular disorder   . Anxiety   . Chronic headaches   . Depression   . COPD (chronic obstructive pulmonary disease)   . Blood clot in vein     left calf   Past Surgical History  Procedure Laterality Date  . Dvt and vein stripping left calf without pe  2002  . Rotator cuff repair Left 2005    detached; left shoulder-reattached  . Shoulder surgery Right 2014    lateral tendon torn   family history includes Breast cancer in his sister; Epilepsy in his brother; Pancreatic cancer in his mother. Current Outpatient Prescriptions  Medication Sig Dispense Refill  . albuterol (PROAIR HFA) 108 (90 BASE) MCG/ACT inhaler Inhale 2 puffs into the lungs every 4 (four) hours as needed for wheezing or shortness of breath. 1 Inhaler prn  . Ascorbic Acid (VITAMIN C) 1000 MG tablet Take 1,000 mg by mouth 2 (two) times daily.      . celecoxib (CELEBREX) 200 MG capsule Take 1 capsule (200 mg total) by mouth daily. 30 capsule 2  . Cholecalciferol (VITAMIN D-3) 5000 UNITS TABS Take 1 tablet by mouth daily.        Marland Kitchen DHEA 25 MG CAPS Take 1 capsule by mouth daily.      . fexofenadine (ALLEGRA) 180 MG tablet Take 180 mg by mouth daily.      . Fluticasone-Salmeterol (ADVAIR DISKUS) 500-50 MCG/DOSE AEPB 1 puff then rinse mouth, twice daily 1 each PRN  . gabapentin (NEURONTIN) 300 MG capsule TAKE 2 CAPSULES BY MOUTH EVERY MORNING, 1 IN THE AFTERNOON AND 2 EVERY NIGHT AT BEDTIME 150 capsule 2  . Glucosamine-Chondroit-Vit C-Mn (GLUCOSAMINE 1500 COMPLEX) CAPS Take 1 capsule by mouth daily.      Marland Kitchen HYDROcodone-acetaminophen (NORCO) 10-325 MG per tablet Take 1 tablet by mouth every 6 (six) hours as needed. 120 tablet 0  . Multiple Vitamin (MULTIVITAMIN) tablet Take 1 tablet by mouth daily.      . Omega-3 Fatty Acids (FISH OIL) 1000 MG CAPS Take 1 capsule by mouth daily.      Marland Kitchen  vitamin E (VITAMIN E) 400 UNIT capsule Take 400 Units by mouth 2 (two) times daily.      . benzonatate (TESSALON) 100 MG capsule Take 2 capsules (200 mg total) by mouth 3 (three) times daily as needed for cough. Take 1-2 by mouth QID prn for cough 20 capsule prn  . EPINEPHrine (EPIPEN 2-PAK) 0.3 mg/0.3 mL SOAJ injection Inject 0.3 mg into the muscle once.    . etodolac (LODINE) 400 MG tablet Take 400 mg by mouth 2 (two) times daily.    . fluticasone (FLONASE) 50 MCG/ACT nasal spray Place 2 sprays into the nose daily. (Patient taking differently: Place 2 sprays into the nose as needed. ) 16 g prn  . Olopatadine HCl (PATANASE) 0.6 % SOLN Place 2 drops (2 puffs total) into the nose 2 (two) times daily as needed. 1 Bottle prn   No current facility-administered medications for this visit.   Allergies as of 09/14/2014 - Review Complete 09/14/2014  Allergen Reaction Noted  . Bee venom  07/16/2012  . Seasonal ic [cholestatin]  06/11/2012    reports that he has never smoked. He has never used smokeless tobacco. He reports that he does not drink alcohol or use illicit drugs.   Review of Systems: He suffers from chronic pain from his back.  He has a  left foot drop Pertinent positive and negative review of systems were noted in the above HPI section. All other review of systems were otherwise negative.  Vital signs were reviewed in today's medical record Physical Exam: General: Well developed , well nourished, no acute distress Skin: anicteric Head: Normocephalic and atraumatic Eyes:  sclerae anicteric, EOMI Ears: Normal auditory acuity Mouth: No deformity or lesions Neck: Supple, no masses or thyromegaly Lungs: Clear throughout to auscultation Heart: Regular rate and rhythm; no murmurs, rubs or bruits Abdomen: Soft, non tender and non distended. No masses, hepatosplenomegaly or hernias noted. Normal Bowel sounds Rectal:deferred Musculoskeletal: Symmetrical with no gross deformities  Skin: No lesions on visible extremities Pulses:  Normal pulses noted Extremities: No clubbing, cyanosis, edema or deformities noted Neurological: Alert oriented x 4, grossly nonfocal Cervical Nodes:  No significant cervical adenopathy Inguinal Nodes: No significant inguinal adenopathy Psychological:  Alert and cooperative. Normal mood and affect  See Assessment and Plan under Problem List

## 2014-09-14 NOTE — Patient Instructions (Signed)
You have been scheduled for an endoscopy. Please follow written instructions given to you at your visit today. If you use inhalers (even only as needed), please bring them with you on the day of your procedure. Your physician has requested that you go to www.startemmi.com and enter the access code given to you at your visit today. This web site gives a general overview about your procedure. However, you should still follow specific instructions given to you by our office regarding your preparation for the procedure.  It has been recommended to you by your physician that you have a(n) COLONOSCOPY completed. Per your request, we did not schedule the procedure(s) today. Please contact our office at [phone] should you decide to have the procedure completed. 

## 2014-09-14 NOTE — Assessment & Plan Note (Signed)
He shouldn't failed therapy with omeprazole.  Plan trial of Protonix 40 mg daily, antireflux measures

## 2014-09-14 NOTE — Assessment & Plan Note (Signed)
Plan screening colonoscopy 

## 2014-09-15 ENCOUNTER — Ambulatory Visit (INDEPENDENT_AMBULATORY_CARE_PROVIDER_SITE_OTHER): Payer: Medicare Other

## 2014-09-15 ENCOUNTER — Encounter: Payer: Self-pay | Admitting: Registered Nurse

## 2014-09-15 ENCOUNTER — Encounter: Payer: Medicare Other | Attending: Physical Medicine & Rehabilitation | Admitting: Registered Nurse

## 2014-09-15 VITALS — BP 132/80 | HR 58 | Resp 14 | Wt 259.6 lb

## 2014-09-15 DIAGNOSIS — M545 Low back pain: Secondary | ICD-10-CM

## 2014-09-15 DIAGNOSIS — Z5181 Encounter for therapeutic drug level monitoring: Secondary | ICD-10-CM | POA: Diagnosis not present

## 2014-09-15 DIAGNOSIS — M47817 Spondylosis without myelopathy or radiculopathy, lumbosacral region: Secondary | ICD-10-CM

## 2014-09-15 DIAGNOSIS — J309 Allergic rhinitis, unspecified: Secondary | ICD-10-CM

## 2014-09-15 DIAGNOSIS — S8410XS Injury of peroneal nerve at lower leg level, unspecified leg, sequela: Secondary | ICD-10-CM

## 2014-09-15 DIAGNOSIS — Z79899 Other long term (current) drug therapy: Secondary | ICD-10-CM | POA: Diagnosis present

## 2014-09-15 DIAGNOSIS — G8929 Other chronic pain: Secondary | ICD-10-CM

## 2014-09-15 MED ORDER — CELECOXIB 200 MG PO CAPS: 200.0000 mg | ORAL_CAPSULE | Freq: Every day | ORAL | Status: DC

## 2014-09-15 MED ORDER — HYDROCODONE-ACETAMINOPHEN 10-325 MG PO TABS: 1.0000 | ORAL_TABLET | Freq: Four times a day (QID) | ORAL | Status: DC | PRN

## 2014-09-15 MED ORDER — GABAPENTIN 300 MG PO CAPS: ORAL_CAPSULE | ORAL | Status: DC
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
## 2014-09-22 ENCOUNTER — Ambulatory Visit (INDEPENDENT_AMBULATORY_CARE_PROVIDER_SITE_OTHER): Payer: Medicare Other

## 2014-09-22 DIAGNOSIS — J309 Allergic rhinitis, unspecified: Secondary | ICD-10-CM

## 2014-09-28 ENCOUNTER — Encounter: Payer: Medicare Other | Admitting: Gastroenterology

## 2014-09-29 ENCOUNTER — Ambulatory Visit (INDEPENDENT_AMBULATORY_CARE_PROVIDER_SITE_OTHER): Payer: Medicare Other

## 2014-09-29 DIAGNOSIS — J309 Allergic rhinitis, unspecified: Secondary | ICD-10-CM

## 2014-10-01 ENCOUNTER — Other Ambulatory Visit: Payer: Self-pay | Admitting: Internal Medicine

## 2014-10-06 ENCOUNTER — Ambulatory Visit (INDEPENDENT_AMBULATORY_CARE_PROVIDER_SITE_OTHER): Payer: Medicare Other

## 2014-10-06 DIAGNOSIS — J309 Allergic rhinitis, unspecified: Secondary | ICD-10-CM

## 2014-10-13 ENCOUNTER — Ambulatory Visit (INDEPENDENT_AMBULATORY_CARE_PROVIDER_SITE_OTHER): Payer: Medicare Other

## 2014-10-13 DIAGNOSIS — J309 Allergic rhinitis, unspecified: Secondary | ICD-10-CM

## 2014-10-17 ENCOUNTER — Encounter: Payer: Self-pay | Admitting: Registered Nurse

## 2014-10-17 ENCOUNTER — Other Ambulatory Visit: Payer: Self-pay | Admitting: Physical Medicine & Rehabilitation

## 2014-10-17 ENCOUNTER — Encounter: Payer: Medicare Other | Attending: Physical Medicine & Rehabilitation | Admitting: Registered Nurse

## 2014-10-17 VITALS — BP 144/76 | HR 66 | Resp 14

## 2014-10-17 DIAGNOSIS — S8410XS Injury of peroneal nerve at lower leg level, unspecified leg, sequela: Secondary | ICD-10-CM

## 2014-10-17 DIAGNOSIS — Z5181 Encounter for therapeutic drug level monitoring: Secondary | ICD-10-CM | POA: Insufficient documentation

## 2014-10-17 DIAGNOSIS — G8929 Other chronic pain: Secondary | ICD-10-CM

## 2014-10-17 DIAGNOSIS — Z79899 Other long term (current) drug therapy: Secondary | ICD-10-CM | POA: Diagnosis present

## 2014-10-17 DIAGNOSIS — G894 Chronic pain syndrome: Secondary | ICD-10-CM

## 2014-10-17 DIAGNOSIS — M545 Low back pain: Secondary | ICD-10-CM

## 2014-10-17 DIAGNOSIS — M47817 Spondylosis without myelopathy or radiculopathy, lumbosacral region: Secondary | ICD-10-CM

## 2014-10-17 MED ORDER — HYDROCODONE-ACETAMINOPHEN 10-325 MG PO TABS: 1.0000 | ORAL_TABLET | Freq: Four times a day (QID) | ORAL | Status: DC | PRN
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
## 2014-10-18 LAB — PMP ALCOHOL METABOLITE (ETG): Ethyl Glucuronide (EtG): NEGATIVE ng/mL

## 2014-10-19 ENCOUNTER — Ambulatory Visit (INDEPENDENT_AMBULATORY_CARE_PROVIDER_SITE_OTHER): Payer: Medicare Other

## 2014-10-19 DIAGNOSIS — J309 Allergic rhinitis, unspecified: Secondary | ICD-10-CM

## 2014-10-22 LAB — OPIATES/OPIOIDS (LC/MS-MS)
Codeine Urine: NEGATIVE ng/mL (ref <50)
Hydrocodone: 144 ng/mL (ref <50)
Hydromorphone: NEGATIVE ng/mL — AB (ref <50)
Morphine Urine: NEGATIVE ng/mL (ref <50)
Norhydrocodone, Ur: 129 ng/mL (ref <50)
Noroxycodone, Ur: NEGATIVE ng/mL (ref <50)
Oxycodone, ur: NEGATIVE ng/mL (ref <50)
Oxymorphone: NEGATIVE ng/mL (ref <50)

## 2014-10-25 LAB — PRESCRIPTION MONITORING PROFILE (SOLSTAS)
Amphetamine/Meth: NEGATIVE ng/mL
Barbiturate Screen, Urine: NEGATIVE ng/mL
Benzodiazepine Screen, Urine: NEGATIVE ng/mL
Buprenorphine, Urine: NEGATIVE ng/mL
Cannabinoid Scrn, Ur: NEGATIVE ng/mL
Carisoprodol, Urine: NEGATIVE ng/mL
Cocaine Metabolites: NEGATIVE ng/mL
Creatinine, Urine: 214.7 mg/dL (ref >20.0)
Fentanyl, Ur: NEGATIVE ng/mL
MDMA URINE: NEGATIVE ng/mL
Meperidine, Ur: NEGATIVE ng/mL
Methadone Screen, Urine: NEGATIVE ng/mL
Nitrites, Initial: NEGATIVE ug/mL
Oxycodone Screen, Ur: NEGATIVE ng/mL
Propoxyphene: NEGATIVE ng/mL
Tapentadol, urine: NEGATIVE ng/mL
Tramadol Scrn, Ur: NEGATIVE ng/mL
Zolpidem, Urine: NEGATIVE ng/mL
pH, Initial: 5.6 pH (ref 4.5–8.9)

## 2014-10-26 ENCOUNTER — Ambulatory Visit (INDEPENDENT_AMBULATORY_CARE_PROVIDER_SITE_OTHER): Payer: Medicare Other

## 2014-10-26 DIAGNOSIS — J309 Allergic rhinitis, unspecified: Secondary | ICD-10-CM

## 2014-11-02 ENCOUNTER — Ambulatory Visit (INDEPENDENT_AMBULATORY_CARE_PROVIDER_SITE_OTHER): Payer: Medicare Other

## 2014-11-02 DIAGNOSIS — J309 Allergic rhinitis, unspecified: Secondary | ICD-10-CM

## 2014-11-02 NOTE — Progress Notes (Signed)
Urine drug screen for this encounter is consistent for prescribed medication 

## 2014-11-03 ENCOUNTER — Ambulatory Visit (INDEPENDENT_AMBULATORY_CARE_PROVIDER_SITE_OTHER): Payer: Medicare Other

## 2014-11-03 DIAGNOSIS — J309 Allergic rhinitis, unspecified: Secondary | ICD-10-CM

## 2014-11-10 ENCOUNTER — Ambulatory Visit (INDEPENDENT_AMBULATORY_CARE_PROVIDER_SITE_OTHER): Payer: Medicare Other

## 2014-11-10 DIAGNOSIS — J309 Allergic rhinitis, unspecified: Secondary | ICD-10-CM

## 2014-11-15 ENCOUNTER — Encounter: Payer: Medicare Other | Attending: Physical Medicine & Rehabilitation | Admitting: Registered Nurse

## 2014-11-15 ENCOUNTER — Encounter: Payer: Self-pay | Admitting: Registered Nurse

## 2014-11-15 VITALS — BP 134/78 | HR 66 | Resp 14

## 2014-11-15 DIAGNOSIS — M47817 Spondylosis without myelopathy or radiculopathy, lumbosacral region: Secondary | ICD-10-CM

## 2014-11-15 DIAGNOSIS — S8410XS Injury of peroneal nerve at lower leg level, unspecified leg, sequela: Secondary | ICD-10-CM

## 2014-11-15 DIAGNOSIS — Z79899 Other long term (current) drug therapy: Secondary | ICD-10-CM | POA: Insufficient documentation

## 2014-11-15 DIAGNOSIS — Z5181 Encounter for therapeutic drug level monitoring: Secondary | ICD-10-CM | POA: Diagnosis present

## 2014-11-15 DIAGNOSIS — G8929 Other chronic pain: Secondary | ICD-10-CM

## 2014-11-15 DIAGNOSIS — M545 Low back pain: Secondary | ICD-10-CM

## 2014-11-15 MED ORDER — GABAPENTIN 300 MG PO CAPS
ORAL_CAPSULE | ORAL | Status: DC
Start: 2014-11-15 — End: 2015-01-16

## 2014-11-15 MED ORDER — CELECOXIB 200 MG PO CAPS: 200.0000 mg | ORAL_CAPSULE | Freq: Every day | ORAL | Status: DC

## 2014-11-15 MED ORDER — HYDROCODONE-ACETAMINOPHEN 10-325 MG PO TABS: 1.0000 | ORAL_TABLET | Freq: Four times a day (QID) | ORAL | Status: DC | PRN
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
## 2014-11-16 ENCOUNTER — Ambulatory Visit (INDEPENDENT_AMBULATORY_CARE_PROVIDER_SITE_OTHER): Payer: Medicare Other

## 2014-11-16 DIAGNOSIS — J309 Allergic rhinitis, unspecified: Secondary | ICD-10-CM

## 2014-11-17 ENCOUNTER — Ambulatory Visit: Payer: Medicare Other

## 2014-11-24 ENCOUNTER — Ambulatory Visit (INDEPENDENT_AMBULATORY_CARE_PROVIDER_SITE_OTHER): Payer: Medicare Other

## 2014-11-24 DIAGNOSIS — J309 Allergic rhinitis, unspecified: Secondary | ICD-10-CM

## 2014-12-01 ENCOUNTER — Ambulatory Visit (INDEPENDENT_AMBULATORY_CARE_PROVIDER_SITE_OTHER): Payer: Medicare Other

## 2014-12-01 DIAGNOSIS — J309 Allergic rhinitis, unspecified: Secondary | ICD-10-CM

## 2014-12-04 ENCOUNTER — Encounter: Payer: Self-pay | Admitting: Internal Medicine

## 2014-12-04 ENCOUNTER — Ambulatory Visit (INDEPENDENT_AMBULATORY_CARE_PROVIDER_SITE_OTHER): Payer: Medicare Other | Admitting: Internal Medicine

## 2014-12-04 VITALS — BP 102/66 | HR 64 | Ht 72.0 in | Wt 252.0 lb

## 2014-12-04 DIAGNOSIS — J301 Allergic rhinitis due to pollen: Secondary | ICD-10-CM

## 2014-12-04 DIAGNOSIS — J449 Chronic obstructive pulmonary disease, unspecified: Secondary | ICD-10-CM

## 2014-12-04 DIAGNOSIS — K219 Gastro-esophageal reflux disease without esophagitis: Secondary | ICD-10-CM

## 2014-12-04 MED ORDER — OLOPATADINE HCL 0.6 % NA SOLN: 1.0000 | Freq: Two times a day (BID) | NASAL | Status: DC | PRN

## 2014-12-04 NOTE — Patient Instructions (Addendum)
We can continue allergy vaccine 1:10 GH  Refill script sent for olopatadine (Patanase) nasal spray for allergies  You can pick up otc fluticasone (Flonase) nasal spray and use that also, if needed.  You can let us know if you would like [place] to send in a script for Flonase.

## 2014-12-04 NOTE — Progress Notes (Signed)
Patient ID: OLIVE MOTYKA, male    DOB: 09/18/1951, 64 y.o.   MRN: [id number]  HPI 64 yo never smoker followed for allergic rhinitis and allergic asthma, complicated by chronic musculoskeletal pain after an injury. Last here August 16, 2010. That note reviewed. He got through the winter ok, still limited by neurologic complaints. He reports a change in cough, deeper with some tightness over last 4 months. No change in perennial sinus drainage and occasional hoarseness. He continues allergy vaccine here at 1:10. 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 He continues Advair 250, reporting no difference with Advair 500 or with Dulera. Nasal sprays seem to work.  Needs refill tessalon, epipen and flonase.   09/19/11-59 yo never smoker followed for allergic rhinitis and allergic asthma, complicated by chronic musculoskeletal pain after an injury. He has noted cough and wheeze more often with cool weather, and some shortness of breath which may be worse. Nothing dramatic. Singing triggers chest tightness. Cold air makes his nose run. Pain management has told him his substernal pain is related to his airways although I have felt it was more likely musculoskeletal. He has been using Advair 250 but wants to try going back to Advair 500 which we discussed.  03/19/12- 81 yo never smoker followed for allergic rhinitis and allergic asthma, complicated by chronic musculoskeletal pain after an injury. Coughing-productive-gray in color; still on vaccine He blames pain in his legs today increased blood pressure. Continues allergy vaccine "not a bad spring". 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 He chose to stay on Advair 500. We discussed the effect of a dry powder steroid inhaler on vocal quality. Rarely needs a rescue inhaler. We discussed trying Dulera again with a spacer to see if that would help his throat.  07/02/12-  45 yo never smoker followed for allergic rhinitis and allergic  asthma, complicated by chronic musculoskeletal pain after an injury. ACUTE visit- stung yesterday on right temple and back of right hand by yellow jackets. Today has tense warm edema across dorsum of right hand and smaller, similar area R temple. No systemic reaction. Plan- depomedrol, sample allegra, cool compresses.   09/24/12-   45 yo never smoker followed for allergic rhinitis and allergic asthma, complicated by chronic musculoskeletal pain after an injury. FOLLOWS FOR: states that he has been having "coughing attacks" for about 3 months. He vomits when he coughs too much. He describes hard coughing paroxysms, wet or dry cough, scant clear mucus. Not clear if he refluxes first but sometimes coughing leads to retching. Also not clear what triggers this cough. He complains of a sense of persistent mild chest congestion at rest and says breathing is "an effort" with occasional wheeze noted. He continues to be followed at pain management where he says oxygen saturation sometimes gets into the 80s there. He preferred Advair after trying Dulera and Symbicort. He describes history of what may have been systemic reaction to multiple yellow jacket stings in the past. Details unclear.  03/24/13- 46 yo never smoker followed for allergic rhinitis and allergic asthma, complicated by chronic musculoskeletal pain after an injury. FOLLOWS FOR: pain in center chest area-doesn't feel full like congestion.   Still on allergy vaccine 1:10 GH and no reactions.  Blames spring pollen for some increase in his chronic sense of right upper anterior chest discomfort, noticed mostly while mowing his lawn with a self-propelled mower. Discomfort is increased with deep breath and he says it is  clearly related to his breathing, not the amount of physical effort. He continues chronic pain clinic followup, polyneuropathy. CXR 09/28/12 IMPRESSION:  1. Hyperexpanded lungs and chronic bronchitic change without  definite acute  cardiopulmonary disease.  2. Borderline enlarged cardiac silhouette with prominence of the  central pulmonary vasculature, nonspecific but may be seen in the  setting of pulmonary arterial hypertension. Further evaluation  with cardiac echo may be performed as clinically indicated.  Original Report Authenticated By: Jake Seats, MD 2D Echo-09/29/12-EF 65-70, mild LVH, grade 1 diastolic dysfunction, mild bi-atrial dilatation without pulmonary hypertension.  09/29/13- 64 yo never smoker followed for allergic rhinitis and allergic asthma, complicated by chronic musculoskeletal pain after an injury. FOLLOWS FOR: still on Allergy vaccine 1:10 GH and doing well; has had flare up of congestion,sore throat, drainage-took Allegra and helped. Needs refills on all medications. Had flu vaccine but worries that he might have the flu because of 2 or 3 days of watery rhinorrhea, sore throat, dry cough and sneeze. Low-grade fever.  03/28/14- 21 yo never smoker followed for allergic rhinitis and allergic asthma/ bronchitis, complicated by chronic musculoskeletal pain after an injury, GERD. FOLLOWS FOR: still on vaccine and wonders if it helps but does not want to come off of it out of fear that he will get bad. Runny nose, itchy and watery eyes. Allergy vaccine 1:10 GH Morning cough and postnasal drip bothering more now than during Spring pollen. Some wheeze or spasm upper substernal area. Occasional  Food hangup upper third esophagus. Strangles easily.  Increased pain meds after R shoulder surgery (? Intubation for GOT).   07/31/14-63 yo never smoker followed for allergic rhinitis and allergic asthma/ bronchitis, complicated by chronic musculoskeletal pain after an injury, GERD. FOLLOW FOR: coughing up yellow mucus, chest pain/tightness - had barium swallow done in June 2015, was diagnosed with GERD, still having trouble with the chest tightness despite taking Omeprazole. Substernal discomfort, sometimes feels like  reflux, present much of the time but worse with overexertion and bending. Omeprazole was insufficient help. Barium Swallow 04/04/14 IMPRESSION:  1. No esophageal stricture or upper esophageal web. Mild dysmotility  distal esophagus with tertiary contractions. No hiatal hernia.  2. Moderate gastroesophageal reflux to the level of mid esophagus.  Electronically Signed  By: Lahoma Crocker M.D.  On: 04/04/2014 14:51   12/04/14- 47 yo never smoker followed for allergic rhinitis and allergic asthma/ bronchitis, complicated by chronic musculoskeletal pain after an injury, GERD. FOLLOWS FOR: pt did see gastro and was prescribed Protonix 40 mg. He has seen a difference. Pt c/o cough with grayish yellow mucus, wheezing and chest tightness. Pt has had some sob. He never had endoscopy recommended by Dr Deatra Ina, but is using the Protonix. No change so far in chronic cough. Continues allergy vaccine 1:10 GH. Frequent rhinorrhea triggered especially by cold air.  ROS-see HPI Constitutional:   No-   weight loss, night sweats, fevers, chills, fatigue, lassitude. HEENT:   No-  headaches, difficulty swallowing, tooth/dental problems, +sore throat,       +sneezing, itching, ear ache, nasal congestion, +post nasal drip,  CV:  +chest pain, no-orthopnea, PND, swelling in lower extremities, anasarca,  dizziness, palpitations Resp: No-   shortness of breath with exertion or at rest.              No-   productive cough,  + non-productive cough,  No- coughing up of blood.              No-  change in color of mucus.  +wheezing.   Skin: No-   rash or lesions. GI:  No-   heartburn, indigestion, abdominal pain, nausea, vomiting,  GU: . MS: +  joint pain or swelling.  No- decreased range of motion.  + back pain. Neuro-     nothing unusual Psych:  No- change in mood or affect. + depression or anxiety.  No memory loss.  OBJ- Physical Exam General- Alert, Oriented, Affect-appropriate, Distress- none acute. Big/ muscular Skin-  rash-none, lesions- none, excoriation- none Lymphadenopathy- none Head- atraumatic            Eyes- Gross vision intact, PERRLA, conjunctivae and secretions clear            Ears- Hearing, canals-normal            Nose- Clear, no-Septal dev, mucus, polyps, erosion, perforation             Throat- Mallampati II , mucosa clear(throat lozenges) , drainage- none, tonsils- atrophic Neck- flexible , trachea midline, no stridor , thyroid nl, carotid no bruit Chest - symmetrical excursion , unlabored           Heart/CV- RRR , no murmur , no gallop  , no rub, nl s1 s2                           - JVD- none , edema- none, stasis changes- none, varices- none           Lung- clear, wheeze- none, cough-none , dullness-none, rub- none           Chest wall- not obviously tender Abd-  Br/ Gen/ Rectal- Not done, not indicated Extrem- cyanosis- none, clubbing, none, atrophy- none, strength- muscular. +splint L lower leg, cane Neuro- grossly intact to observation

## 2014-12-08 ENCOUNTER — Ambulatory Visit (INDEPENDENT_AMBULATORY_CARE_PROVIDER_SITE_OTHER): Payer: Medicare Other

## 2014-12-08 DIAGNOSIS — J309 Allergic rhinitis, unspecified: Secondary | ICD-10-CM

## 2014-12-10 NOTE — Assessment & Plan Note (Signed)
Plan-continue allergy vaccine. Watch response to acid blocker therapy for GERD

## 2014-12-10 NOTE — Assessment & Plan Note (Signed)
Continues allergy vaccine. Discussed antihistamine and nasal steroid

## 2014-12-10 NOTE — Assessment & Plan Note (Signed)
Now using a proton pump inhibitor per GI/Dr. Deatra Ina

## 2014-12-15 ENCOUNTER — Ambulatory Visit (INDEPENDENT_AMBULATORY_CARE_PROVIDER_SITE_OTHER): Payer: Medicare Other

## 2014-12-15 DIAGNOSIS — J309 Allergic rhinitis, unspecified: Secondary | ICD-10-CM

## 2014-12-19 ENCOUNTER — Encounter: Payer: Self-pay | Admitting: Registered Nurse

## 2014-12-19 ENCOUNTER — Encounter: Payer: Medicare Other | Attending: Physical Medicine & Rehabilitation | Admitting: Registered Nurse

## 2014-12-19 VITALS — BP 123/68 | HR 64 | Resp 14

## 2014-12-19 DIAGNOSIS — M47817 Spondylosis without myelopathy or radiculopathy, lumbosacral region: Secondary | ICD-10-CM

## 2014-12-19 DIAGNOSIS — Z5181 Encounter for therapeutic drug level monitoring: Secondary | ICD-10-CM

## 2014-12-19 DIAGNOSIS — M545 Low back pain, unspecified: Secondary | ICD-10-CM

## 2014-12-19 DIAGNOSIS — S8410XS Injury of peroneal nerve at lower leg level, unspecified leg, sequela: Secondary | ICD-10-CM

## 2014-12-19 DIAGNOSIS — Z79899 Other long term (current) drug therapy: Secondary | ICD-10-CM | POA: Diagnosis present

## 2014-12-19 DIAGNOSIS — G8929 Other chronic pain: Secondary | ICD-10-CM

## 2014-12-19 MED ORDER — HYDROCODONE-ACETAMINOPHEN 10-325 MG PO TABS: 1.0000 | ORAL_TABLET | Freq: Four times a day (QID) | ORAL | Status: DC | PRN
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
## 2014-12-22 ENCOUNTER — Ambulatory Visit (INDEPENDENT_AMBULATORY_CARE_PROVIDER_SITE_OTHER): Payer: Medicare Other

## 2014-12-22 DIAGNOSIS — J309 Allergic rhinitis, unspecified: Secondary | ICD-10-CM

## 2014-12-29 ENCOUNTER — Ambulatory Visit (INDEPENDENT_AMBULATORY_CARE_PROVIDER_SITE_OTHER): Payer: Medicare Other

## 2014-12-29 DIAGNOSIS — J309 Allergic rhinitis, unspecified: Secondary | ICD-10-CM

## 2015-01-05 ENCOUNTER — Ambulatory Visit (INDEPENDENT_AMBULATORY_CARE_PROVIDER_SITE_OTHER): Payer: Medicare Other

## 2015-01-05 DIAGNOSIS — J309 Allergic rhinitis, unspecified: Secondary | ICD-10-CM

## 2015-01-12 ENCOUNTER — Ambulatory Visit (INDEPENDENT_AMBULATORY_CARE_PROVIDER_SITE_OTHER): Payer: Medicare Other

## 2015-01-12 DIAGNOSIS — J309 Allergic rhinitis, unspecified: Secondary | ICD-10-CM

## 2015-01-16 ENCOUNTER — Encounter: Payer: Self-pay | Admitting: Registered Nurse

## 2015-01-16 ENCOUNTER — Encounter: Payer: Medicare Other | Attending: Physical Medicine & Rehabilitation | Admitting: Registered Nurse

## 2015-01-16 VITALS — BP 141/82 | HR 66 | Resp 14

## 2015-01-16 DIAGNOSIS — G8929 Other chronic pain: Secondary | ICD-10-CM

## 2015-01-16 DIAGNOSIS — Z5181 Encounter for therapeutic drug level monitoring: Secondary | ICD-10-CM | POA: Insufficient documentation

## 2015-01-16 DIAGNOSIS — M47817 Spondylosis without myelopathy or radiculopathy, lumbosacral region: Secondary | ICD-10-CM

## 2015-01-16 DIAGNOSIS — S8410XS Injury of peroneal nerve at lower leg level, unspecified leg, sequela: Secondary | ICD-10-CM | POA: Diagnosis not present

## 2015-01-16 DIAGNOSIS — Z79899 Other long term (current) drug therapy: Secondary | ICD-10-CM | POA: Diagnosis present

## 2015-01-16 DIAGNOSIS — M545 Low back pain, unspecified: Secondary | ICD-10-CM

## 2015-01-16 MED ORDER — HYDROCODONE-ACETAMINOPHEN 10-325 MG PO TABS: 1.0000 | ORAL_TABLET | Freq: Four times a day (QID) | ORAL | Status: DC | PRN

## 2015-01-16 MED ORDER — CELECOXIB 200 MG PO CAPS: 200.0000 mg | ORAL_CAPSULE | Freq: Every day | ORAL | Status: DC

## 2015-01-16 MED ORDER — GABAPENTIN 300 MG PO CAPS: ORAL_CAPSULE | ORAL | Status: DC
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
## 2015-01-18 ENCOUNTER — Ambulatory Visit: Payer: Medicare Other

## 2015-01-26 ENCOUNTER — Ambulatory Visit (INDEPENDENT_AMBULATORY_CARE_PROVIDER_SITE_OTHER): Payer: Medicare Other

## 2015-01-26 DIAGNOSIS — J309 Allergic rhinitis, unspecified: Secondary | ICD-10-CM | POA: Diagnosis not present

## 2015-02-02 ENCOUNTER — Ambulatory Visit (INDEPENDENT_AMBULATORY_CARE_PROVIDER_SITE_OTHER): Payer: Medicare Other

## 2015-02-02 DIAGNOSIS — J309 Allergic rhinitis, unspecified: Secondary | ICD-10-CM | POA: Diagnosis not present

## 2015-02-09 ENCOUNTER — Ambulatory Visit (INDEPENDENT_AMBULATORY_CARE_PROVIDER_SITE_OTHER): Payer: Medicare Other

## 2015-02-09 DIAGNOSIS — J309 Allergic rhinitis, unspecified: Secondary | ICD-10-CM

## 2015-02-15 ENCOUNTER — Ambulatory Visit (HOSPITAL_BASED_OUTPATIENT_CLINIC_OR_DEPARTMENT_OTHER): Payer: Medicare Other | Admitting: Physical Medicine & Rehabilitation

## 2015-02-15 ENCOUNTER — Ambulatory Visit (INDEPENDENT_AMBULATORY_CARE_PROVIDER_SITE_OTHER): Payer: Medicare Other

## 2015-02-15 ENCOUNTER — Encounter: Payer: Self-pay | Admitting: Physical Medicine & Rehabilitation

## 2015-02-15 ENCOUNTER — Other Ambulatory Visit: Payer: Self-pay | Admitting: Physical Medicine & Rehabilitation

## 2015-02-15 ENCOUNTER — Encounter: Payer: Medicare Other | Attending: Physical Medicine & Rehabilitation

## 2015-02-15 VITALS — BP 134/66 | HR 68 | Resp 14

## 2015-02-15 DIAGNOSIS — Z5181 Encounter for therapeutic drug level monitoring: Secondary | ICD-10-CM | POA: Insufficient documentation

## 2015-02-15 DIAGNOSIS — Z79899 Other long term (current) drug therapy: Secondary | ICD-10-CM | POA: Insufficient documentation

## 2015-02-15 DIAGNOSIS — M549 Dorsalgia, unspecified: Secondary | ICD-10-CM

## 2015-02-15 DIAGNOSIS — G894 Chronic pain syndrome: Secondary | ICD-10-CM | POA: Diagnosis not present

## 2015-02-15 DIAGNOSIS — J309 Allergic rhinitis, unspecified: Secondary | ICD-10-CM | POA: Diagnosis not present

## 2015-02-15 DIAGNOSIS — S8410XS Injury of peroneal nerve at lower leg level, unspecified leg, sequela: Secondary | ICD-10-CM

## 2015-02-15 DIAGNOSIS — G8929 Other chronic pain: Secondary | ICD-10-CM

## 2015-02-15 MED ORDER — HYDROCODONE-ACETAMINOPHEN 10-325 MG PO TABS: 1.0000 | ORAL_TABLET | Freq: Four times a day (QID) | ORAL | Status: DC | PRN

## 2015-02-15 NOTE — Patient Instructions (Addendum)
Continue home exercises, recommend walking outdoors 10 minutes 3 times per day
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
## 2015-02-16 LAB — PMP ALCOHOL METABOLITE (ETG): Ethyl Glucuronide (EtG): NEGATIVE ng/mL

## 2015-02-20 LAB — OPIATES/OPIOIDS (LC/MS-MS)
Codeine Urine: NEGATIVE ng/mL (ref <50)
Hydrocodone: 1428 ng/mL (ref <50)
Hydromorphone: 332 ng/mL (ref <50)
Morphine Urine: NEGATIVE ng/mL (ref <50)
Norhydrocodone, Ur: 1714 ng/mL (ref <50)
Noroxycodone, Ur: NEGATIVE ng/mL (ref <50)
Oxycodone, ur: NEGATIVE ng/mL (ref <50)
Oxymorphone: NEGATIVE ng/mL (ref <50)

## 2015-02-21 LAB — PRESCRIPTION MONITORING PROFILE (SOLSTAS)
Amphetamine/Meth: NEGATIVE ng/mL
Barbiturate Screen, Urine: NEGATIVE ng/mL
Benzodiazepine Screen, Urine: NEGATIVE ng/mL
Buprenorphine, Urine: NEGATIVE ng/mL
Cannabinoid Scrn, Ur: NEGATIVE ng/mL
Carisoprodol, Urine: NEGATIVE ng/mL
Cocaine Metabolites: NEGATIVE ng/mL
Creatinine, Urine: 152.13 mg/dL (ref >20.0)
Fentanyl, Ur: NEGATIVE ng/mL
MDMA URINE: NEGATIVE ng/mL
Meperidine, Ur: NEGATIVE ng/mL
Methadone Screen, Urine: NEGATIVE ng/mL
Nitrites, Initial: NEGATIVE ug/mL
Oxycodone Screen, Ur: NEGATIVE ng/mL
Propoxyphene: NEGATIVE ng/mL
Tapentadol, urine: NEGATIVE ng/mL
Tramadol Scrn, Ur: NEGATIVE ng/mL
Zolpidem, Urine: NEGATIVE ng/mL
pH, Initial: 5.4 pH (ref 4.5–8.9)

## 2015-02-23 ENCOUNTER — Ambulatory Visit (INDEPENDENT_AMBULATORY_CARE_PROVIDER_SITE_OTHER): Payer: Medicare Other

## 2015-02-23 DIAGNOSIS — J309 Allergic rhinitis, unspecified: Secondary | ICD-10-CM

## 2015-03-02 ENCOUNTER — Ambulatory Visit (INDEPENDENT_AMBULATORY_CARE_PROVIDER_SITE_OTHER): Payer: Medicare Other

## 2015-03-02 DIAGNOSIS — J309 Allergic rhinitis, unspecified: Secondary | ICD-10-CM

## 2015-03-08 ENCOUNTER — Ambulatory Visit (INDEPENDENT_AMBULATORY_CARE_PROVIDER_SITE_OTHER): Payer: Medicare Other

## 2015-03-08 DIAGNOSIS — J309 Allergic rhinitis, unspecified: Secondary | ICD-10-CM | POA: Diagnosis not present

## 2015-03-09 ENCOUNTER — Encounter: Payer: Self-pay | Admitting: Internal Medicine

## 2015-03-13 NOTE — Progress Notes (Signed)
Urine drug screen for this encounter is consistent for prescribed medication 

## 2015-03-14 ENCOUNTER — Encounter: Payer: Medicare Other | Attending: Physical Medicine & Rehabilitation | Admitting: Registered Nurse

## 2015-03-14 ENCOUNTER — Encounter: Payer: Self-pay | Admitting: Registered Nurse

## 2015-03-14 VITALS — BP 116/63 | HR 73 | Resp 16

## 2015-03-14 DIAGNOSIS — M47817 Spondylosis without myelopathy or radiculopathy, lumbosacral region: Secondary | ICD-10-CM

## 2015-03-14 DIAGNOSIS — Z5181 Encounter for therapeutic drug level monitoring: Secondary | ICD-10-CM

## 2015-03-14 DIAGNOSIS — S8410XS Injury of peroneal nerve at lower leg level, unspecified leg, sequela: Secondary | ICD-10-CM | POA: Diagnosis not present

## 2015-03-14 DIAGNOSIS — Z79899 Other long term (current) drug therapy: Secondary | ICD-10-CM | POA: Diagnosis present

## 2015-03-14 DIAGNOSIS — M25561 Pain in right knee: Secondary | ICD-10-CM

## 2015-03-14 DIAGNOSIS — G894 Chronic pain syndrome: Secondary | ICD-10-CM

## 2015-03-14 MED ORDER — GABAPENTIN 300 MG PO CAPS: ORAL_CAPSULE | ORAL | Status: DC

## 2015-03-14 MED ORDER — CELECOXIB 200 MG PO CAPS: 200.0000 mg | ORAL_CAPSULE | Freq: Every day | ORAL | Status: DC

## 2015-03-14 MED ORDER — HYDROCODONE-ACETAMINOPHEN 10-325 MG PO TABS: 1.0000 | ORAL_TABLET | Freq: Four times a day (QID) | ORAL | Status: DC | PRN
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
## 2015-03-16 ENCOUNTER — Ambulatory Visit (INDEPENDENT_AMBULATORY_CARE_PROVIDER_SITE_OTHER): Payer: Medicare Other

## 2015-03-16 DIAGNOSIS — J309 Allergic rhinitis, unspecified: Secondary | ICD-10-CM | POA: Diagnosis not present

## 2015-03-23 ENCOUNTER — Ambulatory Visit (INDEPENDENT_AMBULATORY_CARE_PROVIDER_SITE_OTHER): Payer: Medicare Other

## 2015-03-23 DIAGNOSIS — J309 Allergic rhinitis, unspecified: Secondary | ICD-10-CM | POA: Diagnosis not present

## 2015-03-30 ENCOUNTER — Ambulatory Visit (INDEPENDENT_AMBULATORY_CARE_PROVIDER_SITE_OTHER): Payer: Medicare Other

## 2015-03-30 DIAGNOSIS — J309 Allergic rhinitis, unspecified: Secondary | ICD-10-CM

## 2015-04-06 ENCOUNTER — Ambulatory Visit (INDEPENDENT_AMBULATORY_CARE_PROVIDER_SITE_OTHER): Payer: Medicare Other

## 2015-04-06 DIAGNOSIS — J309 Allergic rhinitis, unspecified: Secondary | ICD-10-CM | POA: Diagnosis not present

## 2015-04-13 ENCOUNTER — Ambulatory Visit (INDEPENDENT_AMBULATORY_CARE_PROVIDER_SITE_OTHER): Payer: Medicare Other

## 2015-04-13 DIAGNOSIS — J309 Allergic rhinitis, unspecified: Secondary | ICD-10-CM

## 2015-04-17 ENCOUNTER — Encounter: Payer: Medicare Other | Attending: Physical Medicine & Rehabilitation | Admitting: Registered Nurse

## 2015-04-17 ENCOUNTER — Encounter: Payer: Self-pay | Admitting: Registered Nurse

## 2015-04-17 VITALS — BP 133/74 | HR 59 | Resp 16

## 2015-04-17 DIAGNOSIS — S8410XS Injury of peroneal nerve at lower leg level, unspecified leg, sequela: Secondary | ICD-10-CM | POA: Diagnosis not present

## 2015-04-17 DIAGNOSIS — Z5181 Encounter for therapeutic drug level monitoring: Secondary | ICD-10-CM

## 2015-04-17 DIAGNOSIS — M25561 Pain in right knee: Secondary | ICD-10-CM | POA: Diagnosis not present

## 2015-04-17 DIAGNOSIS — M47817 Spondylosis without myelopathy or radiculopathy, lumbosacral region: Secondary | ICD-10-CM | POA: Diagnosis not present

## 2015-04-17 DIAGNOSIS — G894 Chronic pain syndrome: Secondary | ICD-10-CM

## 2015-04-17 DIAGNOSIS — Z79899 Other long term (current) drug therapy: Secondary | ICD-10-CM

## 2015-04-17 DIAGNOSIS — M542 Cervicalgia: Secondary | ICD-10-CM

## 2015-04-17 MED ORDER — HYDROCODONE-ACETAMINOPHEN 10-325 MG PO TABS: 1.0000 | ORAL_TABLET | Freq: Four times a day (QID) | ORAL | Status: DC | PRN
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
## 2015-04-20 ENCOUNTER — Ambulatory Visit (INDEPENDENT_AMBULATORY_CARE_PROVIDER_SITE_OTHER): Payer: Medicare Other

## 2015-04-20 DIAGNOSIS — J309 Allergic rhinitis, unspecified: Secondary | ICD-10-CM | POA: Diagnosis not present

## 2015-04-27 ENCOUNTER — Ambulatory Visit (INDEPENDENT_AMBULATORY_CARE_PROVIDER_SITE_OTHER): Payer: Medicare Other

## 2015-04-27 DIAGNOSIS — J309 Allergic rhinitis, unspecified: Secondary | ICD-10-CM | POA: Diagnosis not present

## 2015-05-04 ENCOUNTER — Ambulatory Visit (INDEPENDENT_AMBULATORY_CARE_PROVIDER_SITE_OTHER): Payer: Medicare Other

## 2015-05-04 DIAGNOSIS — J309 Allergic rhinitis, unspecified: Secondary | ICD-10-CM

## 2015-05-11 ENCOUNTER — Ambulatory Visit (INDEPENDENT_AMBULATORY_CARE_PROVIDER_SITE_OTHER): Payer: Medicare Other

## 2015-05-11 DIAGNOSIS — J309 Allergic rhinitis, unspecified: Secondary | ICD-10-CM | POA: Diagnosis not present

## 2015-05-18 ENCOUNTER — Ambulatory Visit (INDEPENDENT_AMBULATORY_CARE_PROVIDER_SITE_OTHER): Payer: Medicare Other

## 2015-05-18 DIAGNOSIS — J309 Allergic rhinitis, unspecified: Secondary | ICD-10-CM | POA: Diagnosis not present

## 2015-05-21 ENCOUNTER — Telehealth: Payer: Self-pay | Admitting: Gastroenterology

## 2015-05-21 NOTE — Telephone Encounter (Signed)
Patient states he suddenly have throat pain when swallowing food 06/18/15. Each day has been worse until today he doesn't want to swallow solids and doesn't feel like he can. He is getting liquids down.  He was seen last year for this problem. He started on Pantoprazole and was scheduled for an EGD. He cancelled the EGD but continued the Pantoprazole for 2 months. At that time he was feeling better and decided to not continue the Pantoprazole. He had been okay up until now. He purchased Omeprazole OTC over the weekend. He is not improving with this.  Appointment scheduled for evaluation tomorrow.

## 2015-05-22 ENCOUNTER — Encounter: Payer: Self-pay | Admitting: Gastroenterology

## 2015-05-22 ENCOUNTER — Ambulatory Visit (INDEPENDENT_AMBULATORY_CARE_PROVIDER_SITE_OTHER): Payer: Medicare Other | Admitting: Gastroenterology

## 2015-05-22 ENCOUNTER — Ambulatory Visit: Payer: Medicare Other | Admitting: Nurse Practitioner

## 2015-05-22 VITALS — BP 112/80 | HR 78 | Ht 74.0 in | Wt 225.4 lb

## 2015-05-22 DIAGNOSIS — R1310 Dysphagia, unspecified: Secondary | ICD-10-CM | POA: Diagnosis not present

## 2015-05-22 DIAGNOSIS — R1314 Dysphagia, pharyngoesophageal phase: Secondary | ICD-10-CM | POA: Diagnosis not present

## 2015-05-22 MED ORDER — PANTOPRAZOLE SODIUM 40 MG PO TBEC: DELAYED_RELEASE_TABLET | ORAL | Status: DC

## 2015-05-22 NOTE — Patient Instructions (Addendum)
We have sent medications to your pharmacy for you to pick up at your convenience.  Purchase mylanta over the counter and begin taking as needed.   Call back for ongoing symptoms.

## 2015-05-22 NOTE — Progress Notes (Signed)
     05/22/2015 Kristopher Gay [id number] [birth year]   History of Present Illness:  This is a 64 year old male who is known to Dr. Deatra Ina.  He was seen by Dr. Deatra Ina in 08/2014 for complaints of dysphagia and was scheduled for EGD as well as screening colonoscopy, however, patient canceled the procedures.  Says that he took the pantoprazole 40 mg daily for a few months and was doing well so decided not to proceed.  He has now been off of PPI since January or February.  Then, this past Friday he said that he started having pain and burning in his lower esophagus when eating/drinking.  Became quite severe on Sunday and he is has only been able to eat small amounts of oatmeal, etc.  He started omeprazole 20 mg OTC a couple of days ago at the recommendation of the pharmacist.  He is not taking any other of his medications since this began, but was taking celebrex daily and also has etodolac BID on his medication list.  A previous barium esophagram in 03/2014 showed mild dysmotility with tertiary contractions and moderate GERD to the level of the mid-esophagus.  He says that this began after eating some tortilla chips on Friday and wonders if one could have scratched his esophagus.  Denies fever or SOB.   Current Medications, Allergies, Past Medical History, Past Surgical History, Family History and Social History were reviewed in [employer] record.   Physical Exam: BP 112/80 mmHg  Pulse 78  Ht 6\' 2"  (1.88 m)  Wt 225 lb 6 oz (102.229 kg)  BMI 28.92 kg/m2  SpO2 98% General: Well developed white male in no acute distress Head: Normocephalic and atraumatic Eyes:  Sclerae anicteric, conjunctiva pink  Ears: Normal auditory acuity Lungs: Clear throughout to auscultation Heart: Regular rate and rhythm Abdomen: Soft, non-distended.  Normal bowel sounds.  Mild-moderate epigastric TTP without R/R/G. Musculoskeletal: Symmetrical with no gross deformities  Extremities: No edema    Neurological: Alert oriented x 4, grossly non-focal Psychological:  Alert and cooperative. Normal mood and affect  Assessment and Recommendations: -Dysphagia and odynophagia:  Had similar issues, although not as severe previously in 2015.  Was recommended that he have and EGD (and a screening colonoscopy) at that time but patient cancelled procedures since he had improvement on PPI.  Today we discussed rescheduling , EGD.  Patient once again declined and opted to try restarting pantoprazole 40 mg daily once again.  He will restart that and can take Maalox/Mylanta prn for now as well.  He will call back to schedule procedure if symptoms worse or persist despite the medication, particularly over the next couple of days.  Could consider carafate suspension or viscous lidocaine for acute symptomatic treatment as well.  ? If symptoms are all due to esophagitis/reflux or if he developed some pill esophagitis or had some minor trauma (? tortilla chip scratched his esophagus).

## 2015-05-24 NOTE — Progress Notes (Signed)
Reviewed and agree with management. Robert D. Kaplan, M.D., FACG  

## 2015-05-25 ENCOUNTER — Ambulatory Visit (INDEPENDENT_AMBULATORY_CARE_PROVIDER_SITE_OTHER): Payer: Medicare Other

## 2015-05-25 DIAGNOSIS — J309 Allergic rhinitis, unspecified: Secondary | ICD-10-CM

## 2015-05-28 ENCOUNTER — Encounter: Payer: Medicare Other | Admitting: Registered Nurse

## 2015-06-01 ENCOUNTER — Ambulatory Visit (INDEPENDENT_AMBULATORY_CARE_PROVIDER_SITE_OTHER): Payer: Medicare Other

## 2015-06-01 DIAGNOSIS — J309 Allergic rhinitis, unspecified: Secondary | ICD-10-CM | POA: Diagnosis not present

## 2015-06-04 ENCOUNTER — Ambulatory Visit: Payer: Medicare Other | Admitting: Internal Medicine

## 2015-06-08 ENCOUNTER — Ambulatory Visit (INDEPENDENT_AMBULATORY_CARE_PROVIDER_SITE_OTHER): Payer: Medicare Other

## 2015-06-08 DIAGNOSIS — J309 Allergic rhinitis, unspecified: Secondary | ICD-10-CM

## 2015-06-13 ENCOUNTER — Encounter: Payer: Self-pay | Admitting: Registered Nurse

## 2015-06-13 ENCOUNTER — Telehealth: Payer: Self-pay | Admitting: Internal Medicine

## 2015-06-13 ENCOUNTER — Encounter: Payer: Medicare Other | Attending: Physical Medicine & Rehabilitation | Admitting: Registered Nurse

## 2015-06-13 ENCOUNTER — Ambulatory Visit (INDEPENDENT_AMBULATORY_CARE_PROVIDER_SITE_OTHER): Payer: Medicare Other

## 2015-06-13 VITALS — BP 121/66 | HR 65 | Resp 14

## 2015-06-13 DIAGNOSIS — S8410XS Injury of peroneal nerve at lower leg level, unspecified leg, sequela: Secondary | ICD-10-CM

## 2015-06-13 DIAGNOSIS — Z5181 Encounter for therapeutic drug level monitoring: Secondary | ICD-10-CM | POA: Diagnosis not present

## 2015-06-13 DIAGNOSIS — J309 Allergic rhinitis, unspecified: Secondary | ICD-10-CM

## 2015-06-13 DIAGNOSIS — G894 Chronic pain syndrome: Secondary | ICD-10-CM

## 2015-06-13 DIAGNOSIS — M47817 Spondylosis without myelopathy or radiculopathy, lumbosacral region: Secondary | ICD-10-CM | POA: Diagnosis not present

## 2015-06-13 DIAGNOSIS — Z79899 Other long term (current) drug therapy: Secondary | ICD-10-CM | POA: Diagnosis present

## 2015-06-13 MED ORDER — HYDROCODONE-ACETAMINOPHEN 10-325 MG PO TABS: 1.0000 | ORAL_TABLET | Freq: Four times a day (QID) | ORAL | Status: DC | PRN

## 2015-06-13 MED ORDER — CELECOXIB 200 MG PO CAPS
200.0000 mg | ORAL_CAPSULE | Freq: Every day | ORAL | Status: DC
Start: 2015-06-13 — End: 2015-09-17

## 2015-06-13 MED ORDER — GABAPENTIN 300 MG PO CAPS: ORAL_CAPSULE | ORAL | Status: DC
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
## 2015-06-14 ENCOUNTER — Encounter: Payer: Self-pay | Admitting: Internal Medicine

## 2015-06-14 ENCOUNTER — Other Ambulatory Visit: Payer: Self-pay | Admitting: Physical Medicine & Rehabilitation

## 2015-06-14 ENCOUNTER — Ambulatory Visit (INDEPENDENT_AMBULATORY_CARE_PROVIDER_SITE_OTHER): Payer: Medicare Other

## 2015-06-14 ENCOUNTER — Telehealth: Payer: Self-pay | Admitting: Internal Medicine

## 2015-06-14 DIAGNOSIS — J309 Allergic rhinitis, unspecified: Secondary | ICD-10-CM

## 2015-06-14 NOTE — Telephone Encounter (Signed)
Date Mixed: 06/14/15 Vial: 1 Strength: 1:10 Here/Mail/Pick Up: here Mixed By: tbs
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
## 2015-06-15 ENCOUNTER — Encounter: Payer: Self-pay | Admitting: Internal Medicine

## 2015-06-15 LAB — PMP ALCOHOL METABOLITE (ETG): Ethyl Glucuronide (EtG): NEGATIVE ng/mL

## 2015-06-18 LAB — PRESCRIPTION MONITORING PROFILE (SOLSTAS)
Amphetamine/Meth: NEGATIVE ng/mL
Barbiturate Screen, Urine: NEGATIVE ng/mL
Benzodiazepine Screen, Urine: NEGATIVE ng/mL
Buprenorphine, Urine: NEGATIVE ng/mL
Cannabinoid Scrn, Ur: NEGATIVE ng/mL
Carisoprodol, Urine: NEGATIVE ng/mL
Cocaine Metabolites: NEGATIVE ng/mL
Creatinine, Urine: 180.55 mg/dL (ref >20.0)
Fentanyl, Ur: NEGATIVE ng/mL
MDMA URINE: NEGATIVE ng/mL
Meperidine, Ur: NEGATIVE ng/mL
Methadone Screen, Urine: NEGATIVE ng/mL
Nitrites, Initial: NEGATIVE ug/mL
Oxycodone Screen, Ur: NEGATIVE ng/mL
Propoxyphene: NEGATIVE ng/mL
Tapentadol, urine: NEGATIVE ng/mL
Tramadol Scrn, Ur: NEGATIVE ng/mL
Zolpidem, Urine: NEGATIVE ng/mL
pH, Initial: 5.7 pH (ref 4.5–8.9)

## 2015-06-18 LAB — OPIATES/OPIOIDS (LC/MS-MS)
Codeine Urine: NEGATIVE ng/mL (ref <50)
Hydrocodone: 1075 ng/mL (ref <50)
Hydromorphone: 189 ng/mL (ref <50)
Morphine Urine: NEGATIVE ng/mL (ref <50)
Norhydrocodone, Ur: 1094 ng/mL (ref <50)
Noroxycodone, Ur: NEGATIVE ng/mL (ref <50)
Oxycodone, ur: NEGATIVE ng/mL (ref <50)
Oxymorphone: NEGATIVE ng/mL (ref <50)

## 2015-06-22 ENCOUNTER — Ambulatory Visit (INDEPENDENT_AMBULATORY_CARE_PROVIDER_SITE_OTHER): Payer: Medicare Other

## 2015-06-22 DIAGNOSIS — J309 Allergic rhinitis, unspecified: Secondary | ICD-10-CM

## 2015-06-29 ENCOUNTER — Ambulatory Visit (INDEPENDENT_AMBULATORY_CARE_PROVIDER_SITE_OTHER): Payer: Medicare Other

## 2015-06-29 DIAGNOSIS — J309 Allergic rhinitis, unspecified: Secondary | ICD-10-CM | POA: Diagnosis not present

## 2015-07-04 NOTE — Progress Notes (Signed)
Urine drug screen for this encounter is consistent for prescribed medication 

## 2015-07-06 ENCOUNTER — Ambulatory Visit (INDEPENDENT_AMBULATORY_CARE_PROVIDER_SITE_OTHER): Payer: Medicare Other

## 2015-07-06 DIAGNOSIS — J309 Allergic rhinitis, unspecified: Secondary | ICD-10-CM | POA: Diagnosis not present

## 2015-07-13 ENCOUNTER — Ambulatory Visit (INDEPENDENT_AMBULATORY_CARE_PROVIDER_SITE_OTHER): Payer: Medicare Other

## 2015-07-13 DIAGNOSIS — J309 Allergic rhinitis, unspecified: Secondary | ICD-10-CM | POA: Diagnosis not present

## 2015-07-19 ENCOUNTER — Encounter: Payer: Self-pay | Admitting: Registered Nurse

## 2015-07-19 ENCOUNTER — Ambulatory Visit (INDEPENDENT_AMBULATORY_CARE_PROVIDER_SITE_OTHER): Payer: Medicare Other

## 2015-07-19 ENCOUNTER — Encounter: Payer: Medicare Other | Attending: Physical Medicine & Rehabilitation | Admitting: Registered Nurse

## 2015-07-19 VITALS — BP 128/68 | HR 83

## 2015-07-19 DIAGNOSIS — S8410XS Injury of peroneal nerve at lower leg level, unspecified leg, sequela: Secondary | ICD-10-CM

## 2015-07-19 DIAGNOSIS — G894 Chronic pain syndrome: Secondary | ICD-10-CM

## 2015-07-19 DIAGNOSIS — Z79899 Other long term (current) drug therapy: Secondary | ICD-10-CM | POA: Diagnosis present

## 2015-07-19 DIAGNOSIS — Z5181 Encounter for therapeutic drug level monitoring: Secondary | ICD-10-CM

## 2015-07-19 DIAGNOSIS — M47817 Spondylosis without myelopathy or radiculopathy, lumbosacral region: Secondary | ICD-10-CM | POA: Diagnosis not present

## 2015-07-19 DIAGNOSIS — J309 Allergic rhinitis, unspecified: Secondary | ICD-10-CM | POA: Diagnosis not present

## 2015-07-19 MED ORDER — HYDROCODONE-ACETAMINOPHEN 10-325 MG PO TABS: 1.0000 | ORAL_TABLET | Freq: Four times a day (QID) | ORAL | Status: DC | PRN
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
## 2015-07-27 ENCOUNTER — Ambulatory Visit (INDEPENDENT_AMBULATORY_CARE_PROVIDER_SITE_OTHER): Payer: Medicare Other

## 2015-07-27 DIAGNOSIS — J309 Allergic rhinitis, unspecified: Secondary | ICD-10-CM

## 2015-07-27 DIAGNOSIS — Z23 Encounter for immunization: Secondary | ICD-10-CM

## 2015-08-03 ENCOUNTER — Ambulatory Visit (INDEPENDENT_AMBULATORY_CARE_PROVIDER_SITE_OTHER): Payer: Medicare Other

## 2015-08-03 DIAGNOSIS — J309 Allergic rhinitis, unspecified: Secondary | ICD-10-CM

## 2015-08-10 ENCOUNTER — Ambulatory Visit (INDEPENDENT_AMBULATORY_CARE_PROVIDER_SITE_OTHER): Payer: Medicare Other

## 2015-08-10 DIAGNOSIS — J309 Allergic rhinitis, unspecified: Secondary | ICD-10-CM | POA: Diagnosis not present

## 2015-08-16 ENCOUNTER — Encounter: Payer: Medicare Other | Attending: Physical Medicine & Rehabilitation | Admitting: Registered Nurse

## 2015-08-16 ENCOUNTER — Encounter: Payer: Self-pay | Admitting: Registered Nurse

## 2015-08-16 VITALS — BP 131/67 | HR 67 | Resp 14

## 2015-08-16 DIAGNOSIS — M542 Cervicalgia: Secondary | ICD-10-CM

## 2015-08-16 DIAGNOSIS — Z5181 Encounter for therapeutic drug level monitoring: Secondary | ICD-10-CM | POA: Insufficient documentation

## 2015-08-16 DIAGNOSIS — S8410XS Injury of peroneal nerve at lower leg level, unspecified leg, sequela: Secondary | ICD-10-CM | POA: Diagnosis not present

## 2015-08-16 DIAGNOSIS — M25561 Pain in right knee: Secondary | ICD-10-CM

## 2015-08-16 DIAGNOSIS — Z79899 Other long term (current) drug therapy: Secondary | ICD-10-CM | POA: Insufficient documentation

## 2015-08-16 DIAGNOSIS — M47817 Spondylosis without myelopathy or radiculopathy, lumbosacral region: Secondary | ICD-10-CM

## 2015-08-16 DIAGNOSIS — G894 Chronic pain syndrome: Secondary | ICD-10-CM | POA: Diagnosis not present

## 2015-08-16 MED ORDER — HYDROCODONE-ACETAMINOPHEN 10-325 MG PO TABS: 1.0000 | ORAL_TABLET | Freq: Four times a day (QID) | ORAL | Status: DC | PRN
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
## 2015-08-17 ENCOUNTER — Ambulatory Visit: Payer: Medicare Other | Admitting: Registered Nurse

## 2015-08-17 ENCOUNTER — Ambulatory Visit (INDEPENDENT_AMBULATORY_CARE_PROVIDER_SITE_OTHER): Payer: Medicare Other

## 2015-08-17 DIAGNOSIS — J309 Allergic rhinitis, unspecified: Secondary | ICD-10-CM | POA: Diagnosis not present

## 2015-08-21 ENCOUNTER — Ambulatory Visit (INDEPENDENT_AMBULATORY_CARE_PROVIDER_SITE_OTHER): Payer: Medicare Other

## 2015-08-21 ENCOUNTER — Ambulatory Visit (INDEPENDENT_AMBULATORY_CARE_PROVIDER_SITE_OTHER): Payer: Medicare Other | Admitting: Family Medicine

## 2015-08-21 VITALS — BP 108/70 | HR 61 | Temp 97.7°F | Resp 16 | Ht 74.0 in | Wt 227.2 lb

## 2015-08-21 DIAGNOSIS — M25522 Pain in left elbow: Secondary | ICD-10-CM | POA: Diagnosis not present

## 2015-08-21 DIAGNOSIS — R079 Chest pain, unspecified: Secondary | ICD-10-CM

## 2015-08-21 DIAGNOSIS — J441 Chronic obstructive pulmonary disease with (acute) exacerbation: Secondary | ICD-10-CM

## 2015-08-21 DIAGNOSIS — M25512 Pain in left shoulder: Secondary | ICD-10-CM | POA: Diagnosis not present

## 2015-08-21 NOTE — Progress Notes (Signed)
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 He's had his left shoulder repair for rotator cuff in his right shoulder repaired for labral tear.  Patient comes in today because [name] on his left side. [name], particularly with any movement, since that time. He has developed left shoulder pain several hours after the injury and also complains of left chest pain with tenderness in the pectoral region.  Patient is on chronic Vicodin for his degenerative disc disease in his lumbar spine.   Current outpatient prescriptions:  .  ADVAIR DISKUS 500-50 MCG/DOSE AEPB, USE ONE INHALATION BY MOUTH TWICE DAILY. RINCE MOUTH, Disp: 1 each, Rfl: PRN .  Ascorbic Acid (VITAMIN C) 1000 MG tablet, Take 1,000 mg by mouth 2 (two) times daily.  , Disp: , Rfl:  .  Cholecalciferol (VITAMIN D-3) 5000 UNITS TABS, Take 1 tablet by mouth daily.  , Disp: , Rfl:  .  DHEA 25 MG CAPS, Take 1 capsule by mouth daily.  , Disp: , Rfl:  .  gabapentin (NEURONTIN) 300 MG capsule, TAKE 2 CAPSULES BY MOUTH EVERY MORNING, 1 IN THE AFTERNOON AND 2 EVERY NIGHT AT BEDTIME, Disp: 150 capsule, Rfl: 2 .  Glucosamine-Chondroit-Vit C-Mn (GLUCOSAMINE 1500 COMPLEX) CAPS, Take 1 capsule by mouth daily.  , Disp: , Rfl:  .  HYDROcodone-acetaminophen (NORCO) 10-325 MG tablet, Take 1 tablet by mouth every 6 (six) hours as needed., Disp: 120 tablet, Rfl: 0 .  Multiple Vitamin (MULTIVITAMIN) tablet, Take 1 tablet by mouth daily.  , Disp: , Rfl:  .  Omega-3 Fatty Acids (FISH OIL) 1000 MG CAPS, Take 1 capsule by mouth daily.  , Disp: , Rfl:  .  pantoprazole (PROTONIX) 40 MG tablet, Take 1 tablet 30 minutes prior to breakfast., Disp: 30 tablet, Rfl: 3 .  vitamin E (VITAMIN E) 400 UNIT capsule, Take 400 Units by mouth 2 (two) times daily.  , Disp: , Rfl:  .  albuterol (PROAIR HFA) 108 (90 BASE) MCG/ACT  inhaler, Inhale 2 puffs into the lungs every 4 (four) hours as needed for wheezing or shortness of breath., Disp: 1 Inhaler, Rfl: prn .  benzonatate (TESSALON) 100 MG capsule, Take 2 capsules (200 mg total) by mouth 3 (three) times daily as needed for cough. Take 1-2 by mouth QID prn for cough (Patient not taking: Reported on 08/21/2015), Disp: 20 capsule, Rfl: prn .  celecoxib (CELEBREX) 200 MG capsule, Take 1 capsule (200 mg total) by mouth daily. (Patient not taking: Reported on 08/21/2015), Disp: 30 capsule, Rfl: 2 .  EPINEPHrine (EPIPEN 2-PAK) 0.3 mg/0.3 mL SOAJ injection, Inject 0.3 mg into the muscle once., Disp: , Rfl:  .  etodolac (LODINE) 400 MG tablet, Take 400 mg by mouth 2 (two) times daily., Disp: , Rfl:  .  fluticasone (FLONASE) 50 MCG/ACT nasal spray, Place 2 sprays into the nose daily. (Patient taking differently: Place 2 sprays into the nose as needed. ), Disp: 16 g, Rfl: prn .  NONFORMULARY OR COMPOUNDED ITEM, Allergy Vaccine 1:10 Given at [facility], Disp: , Rfl:  .  Olopatadine HCl (PATANASE) 0.6 % SOLN, Place 1-2 drops (1-2 puffs total) into the nose 2 (two) times daily as needed. (Patient not taking: Reported on 08/21/2015), Disp: 1 Bottle, Rfl: prn   Past Medical History  Diagnosis Date  . Unspecified asthma(493.90)   . Allergic rhinitis, cause unspecified   .  Extrinsic asthma, unspecified   . DDD (degenerative disc disease)   . Neuromuscular disorder ([place])   . Anxiety   . Chronic headaches   . Depression   . COPD (chronic obstructive pulmonary disease) ([place])   . Blood clot in vein     left calf  . Allergy   . Emphysema of lung ([place])     Objective: Patient is alert and articulate. He is holding his left arm in flexion at 90. He uses a cane.  BP 108/70 mmHg  Pulse 61  Temp(Src) 97.7 F (36.5 C) (Oral)  Resp 16  Ht 6\' 2"  (1.88 m)  Wt 227 lb 3.2 oz (103.057 kg)  BMI 29.16 kg/m2  SpO2 98% Left elbow: Patient has point tenderness in the olecranon area  as well as the proximal brachial radialis Left shoulder: No bony abnormality or ecchymosis, patient has some pain in the anterior joint line Chest: Bilateral ronchi, tender over the lateral margin of the pectoralis major Heart: Regular no murmur  [facility] reading (PRIMARY) by  Dr. Joseph Gay:  Negative chest except for some peribronchial cuffing. Left elbow shows extremely severe arthritis but no fracture and no effusion Left shoulder negative  Assessment:  Patient has the worse case of arthritis in his left elbow that I have ever seen. The other areas look okay. I'm concerned about him getting a fused elbow.  Plan: Patient is being referred to [place] and getting a sling today. He will restart his Celebrex and continue on his Vicodin for pain. I'm referring him an order that we prevent a frozen elbow.  Signed, Kristopher Gay.D.

## 2015-08-24 ENCOUNTER — Ambulatory Visit (INDEPENDENT_AMBULATORY_CARE_PROVIDER_SITE_OTHER): Payer: Medicare Other

## 2015-08-24 DIAGNOSIS — J309 Allergic rhinitis, unspecified: Secondary | ICD-10-CM | POA: Diagnosis not present

## 2015-08-31 ENCOUNTER — Ambulatory Visit (INDEPENDENT_AMBULATORY_CARE_PROVIDER_SITE_OTHER): Payer: Medicare Other

## 2015-08-31 DIAGNOSIS — J309 Allergic rhinitis, unspecified: Secondary | ICD-10-CM | POA: Diagnosis not present

## 2015-09-03 ENCOUNTER — Encounter: Payer: Self-pay | Admitting: Internal Medicine

## 2015-09-07 ENCOUNTER — Ambulatory Visit (INDEPENDENT_AMBULATORY_CARE_PROVIDER_SITE_OTHER): Payer: Medicare Other

## 2015-09-07 DIAGNOSIS — J309 Allergic rhinitis, unspecified: Secondary | ICD-10-CM | POA: Diagnosis not present

## 2015-09-14 ENCOUNTER — Ambulatory Visit (INDEPENDENT_AMBULATORY_CARE_PROVIDER_SITE_OTHER): Payer: Medicare Other

## 2015-09-14 DIAGNOSIS — J309 Allergic rhinitis, unspecified: Secondary | ICD-10-CM | POA: Diagnosis not present

## 2015-09-17 ENCOUNTER — Encounter: Payer: Self-pay | Admitting: Registered Nurse

## 2015-09-17 ENCOUNTER — Encounter: Payer: Medicare Other | Attending: Physical Medicine & Rehabilitation | Admitting: Registered Nurse

## 2015-09-17 VITALS — BP 119/57 | HR 64

## 2015-09-17 DIAGNOSIS — S8410XS Injury of peroneal nerve at lower leg level, unspecified leg, sequela: Secondary | ICD-10-CM | POA: Diagnosis not present

## 2015-09-17 DIAGNOSIS — M791 Myalgia: Secondary | ICD-10-CM | POA: Diagnosis not present

## 2015-09-17 DIAGNOSIS — M542 Cervicalgia: Secondary | ICD-10-CM

## 2015-09-17 DIAGNOSIS — M47817 Spondylosis without myelopathy or radiculopathy, lumbosacral region: Secondary | ICD-10-CM

## 2015-09-17 DIAGNOSIS — Z79899 Other long term (current) drug therapy: Secondary | ICD-10-CM | POA: Diagnosis present

## 2015-09-17 DIAGNOSIS — Z5181 Encounter for therapeutic drug level monitoring: Secondary | ICD-10-CM | POA: Diagnosis present

## 2015-09-17 DIAGNOSIS — M609 Myositis, unspecified: Secondary | ICD-10-CM

## 2015-09-17 DIAGNOSIS — IMO0001 Reserved for inherently not codable concepts without codable children: Secondary | ICD-10-CM

## 2015-09-17 DIAGNOSIS — G894 Chronic pain syndrome: Secondary | ICD-10-CM

## 2015-09-17 MED ORDER — CELECOXIB 200 MG PO CAPS: 200.0000 mg | ORAL_CAPSULE | Freq: Every day | ORAL | Status: DC

## 2015-09-17 MED ORDER — HYDROCODONE-ACETAMINOPHEN 10-325 MG PO TABS: 1.0000 | ORAL_TABLET | Freq: Four times a day (QID) | ORAL | Status: DC | PRN

## 2015-09-17 MED ORDER — GABAPENTIN 300 MG PO CAPS: ORAL_CAPSULE | ORAL | Status: DC

## 2015-09-18 ENCOUNTER — Encounter: Payer: Self-pay | Admitting: Registered Nurse
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
## 2015-09-21 ENCOUNTER — Ambulatory Visit (INDEPENDENT_AMBULATORY_CARE_PROVIDER_SITE_OTHER): Payer: Medicare Other

## 2015-09-21 DIAGNOSIS — J309 Allergic rhinitis, unspecified: Secondary | ICD-10-CM | POA: Diagnosis not present

## 2015-09-28 ENCOUNTER — Ambulatory Visit (INDEPENDENT_AMBULATORY_CARE_PROVIDER_SITE_OTHER): Payer: Medicare Other

## 2015-09-28 DIAGNOSIS — J309 Allergic rhinitis, unspecified: Secondary | ICD-10-CM | POA: Diagnosis not present

## 2015-10-05 ENCOUNTER — Ambulatory Visit (INDEPENDENT_AMBULATORY_CARE_PROVIDER_SITE_OTHER): Payer: Medicare Other

## 2015-10-05 DIAGNOSIS — J309 Allergic rhinitis, unspecified: Secondary | ICD-10-CM | POA: Diagnosis not present

## 2015-10-12 ENCOUNTER — Ambulatory Visit (HOSPITAL_BASED_OUTPATIENT_CLINIC_OR_DEPARTMENT_OTHER): Payer: Medicare Other | Admitting: Physical Medicine & Rehabilitation

## 2015-10-12 ENCOUNTER — Encounter: Payer: Medicare Other | Attending: Physical Medicine & Rehabilitation

## 2015-10-12 ENCOUNTER — Ambulatory Visit (INDEPENDENT_AMBULATORY_CARE_PROVIDER_SITE_OTHER): Payer: Medicare Other

## 2015-10-12 ENCOUNTER — Encounter: Payer: Self-pay | Admitting: Physical Medicine & Rehabilitation

## 2015-10-12 VITALS — BP 129/72 | HR 69 | Resp 14

## 2015-10-12 DIAGNOSIS — M47817 Spondylosis without myelopathy or radiculopathy, lumbosacral region: Secondary | ICD-10-CM | POA: Diagnosis not present

## 2015-10-12 DIAGNOSIS — Z5181 Encounter for therapeutic drug level monitoring: Secondary | ICD-10-CM | POA: Diagnosis not present

## 2015-10-12 DIAGNOSIS — G894 Chronic pain syndrome: Secondary | ICD-10-CM | POA: Diagnosis not present

## 2015-10-12 DIAGNOSIS — Z79899 Other long term (current) drug therapy: Secondary | ICD-10-CM | POA: Diagnosis present

## 2015-10-12 DIAGNOSIS — S8410XS Injury of peroneal nerve at lower leg level, unspecified leg, sequela: Secondary | ICD-10-CM

## 2015-10-12 DIAGNOSIS — J309 Allergic rhinitis, unspecified: Secondary | ICD-10-CM | POA: Diagnosis not present

## 2015-10-12 MED ORDER — HYDROCODONE-ACETAMINOPHEN 10-325 MG PO TABS: 1.0000 | ORAL_TABLET | Freq: Four times a day (QID) | ORAL | Status: DC | PRN
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
## 2015-10-12 NOTE — Patient Instructions (Signed)

## 2015-10-15 ENCOUNTER — Other Ambulatory Visit: Payer: Self-pay | Admitting: Internal Medicine

## 2015-10-15 ENCOUNTER — Encounter: Payer: Self-pay | Admitting: Internal Medicine

## 2015-10-16 ENCOUNTER — Telehealth: Payer: Self-pay | Admitting: Internal Medicine

## 2015-10-16 NOTE — Telephone Encounter (Signed)
Allergy Serum Extract Date Mixed: 10/16/15 Vial: 1 Strength: 1:10 Here/Mail/Pick Up: here Mixed By: tbs Last OV: 12/04/14 Pending OV: 11/12/15

## 2015-10-17 DIAGNOSIS — J309 Allergic rhinitis, unspecified: Secondary | ICD-10-CM | POA: Diagnosis not present

## 2015-10-19 ENCOUNTER — Ambulatory Visit (INDEPENDENT_AMBULATORY_CARE_PROVIDER_SITE_OTHER): Payer: Medicare Other

## 2015-10-19 DIAGNOSIS — J309 Allergic rhinitis, unspecified: Secondary | ICD-10-CM | POA: Diagnosis not present

## 2015-10-26 ENCOUNTER — Ambulatory Visit (INDEPENDENT_AMBULATORY_CARE_PROVIDER_SITE_OTHER): Payer: Medicare Other

## 2015-10-26 DIAGNOSIS — J309 Allergic rhinitis, unspecified: Secondary | ICD-10-CM | POA: Diagnosis not present

## 2015-11-02 ENCOUNTER — Ambulatory Visit (INDEPENDENT_AMBULATORY_CARE_PROVIDER_SITE_OTHER): Payer: Medicare Other

## 2015-11-02 DIAGNOSIS — J309 Allergic rhinitis, unspecified: Secondary | ICD-10-CM | POA: Diagnosis not present

## 2015-11-08 ENCOUNTER — Ambulatory Visit (INDEPENDENT_AMBULATORY_CARE_PROVIDER_SITE_OTHER): Payer: Medicare Other

## 2015-11-08 DIAGNOSIS — J309 Allergic rhinitis, unspecified: Secondary | ICD-10-CM

## 2015-11-12 ENCOUNTER — Ambulatory Visit (INDEPENDENT_AMBULATORY_CARE_PROVIDER_SITE_OTHER): Payer: Medicare Other | Admitting: Internal Medicine

## 2015-11-12 ENCOUNTER — Encounter: Payer: Self-pay | Admitting: Internal Medicine

## 2015-11-12 VITALS — BP 120/76 | HR 77 | Ht 74.0 in | Wt 221.4 lb

## 2015-11-12 DIAGNOSIS — J45909 Unspecified asthma, uncomplicated: Secondary | ICD-10-CM | POA: Diagnosis not present

## 2015-11-12 DIAGNOSIS — J449 Chronic obstructive pulmonary disease, unspecified: Secondary | ICD-10-CM

## 2015-11-12 DIAGNOSIS — J301 Allergic rhinitis due to pollen: Secondary | ICD-10-CM

## 2015-11-12 MED ORDER — FLUTICASONE-SALMETEROL 500-50 MCG/DOSE IN AEPB: INHALATION_SPRAY | RESPIRATORY_TRACT | Status: DC

## 2015-11-12 MED ORDER — FLUTICASONE PROPIONATE 50 MCG/ACT NA SUSP: NASAL | Status: DC

## 2015-11-12 MED ORDER — ALBUTEROL SULFATE HFA 108 (90 BASE) MCG/ACT IN AERS: 2.0000 | INHALATION_SPRAY | RESPIRATORY_TRACT | Status: DC | PRN

## 2015-11-12 NOTE — Progress Notes (Signed)
Patient ID: Kristopher Gay, male    DOB: 09/18/1951, 65 y.o.   MRN: [id number]  HPI 65 yo never smoker followed for allergic rhinitis and allergic asthma, complicated by chronic musculoskeletal pain after an injury. Last here August 16, 2010. That note reviewed. He got through the winter ok, still limited by neurologic complaints. He reports a change in cough, deeper with some tightness over last 4 months. No change in perennial sinus drainage and occasional hoarseness. He continues allergy vaccine here at 1:10. 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 He continues Advair 250, reporting no difference with Advair 500 or with Dulera. Nasal sprays seem to work.  Needs refill tessalon, epipen and flonase.   09/19/11-59 yo never smoker followed for allergic rhinitis and allergic asthma, complicated by chronic musculoskeletal pain after an injury. He has noted cough and wheeze more often with cool weather, and some shortness of breath which may be worse. Nothing dramatic. Singing triggers chest tightness. Cold air makes his nose run. Pain management has told him his substernal pain is related to his airways although I have felt it was more likely musculoskeletal. He has been using Advair 250 but wants to try going back to Advair 500 which we discussed.  03/19/12- 81 yo never smoker followed for allergic rhinitis and allergic asthma, complicated by chronic musculoskeletal pain after an injury. Coughing-productive-gray in color; still on vaccine He blames pain in his legs today increased blood pressure. Continues allergy vaccine "not a bad spring". 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 He chose to stay on Advair 500. We discussed the effect of a dry powder steroid inhaler on vocal quality. Rarely needs a rescue inhaler. We discussed trying Dulera again with a spacer to see if that would help his throat.  07/02/12-  45 yo never smoker followed for allergic rhinitis and allergic  asthma, complicated by chronic musculoskeletal pain after an injury. ACUTE visit- stung yesterday on right temple and back of right hand by yellow jackets. Today has tense warm edema across dorsum of right hand and smaller, similar area R temple. No systemic reaction. Plan- depomedrol, sample allegra, cool compresses.   09/24/12-   45 yo never smoker followed for allergic rhinitis and allergic asthma, complicated by chronic musculoskeletal pain after an injury. FOLLOWS FOR: states that he has been having "coughing attacks" for about 3 months. He vomits when he coughs too much. He describes hard coughing paroxysms, wet or dry cough, scant clear mucus. Not clear if he refluxes first but sometimes coughing leads to retching. Also not clear what triggers this cough. He complains of a sense of persistent mild chest congestion at rest and says breathing is "an effort" with occasional wheeze noted. He continues to be followed at pain management where he says oxygen saturation sometimes gets into the 80s there. He preferred Advair after trying Dulera and Symbicort. He describes history of what may have been systemic reaction to multiple yellow jacket stings in the past. Details unclear.  03/24/13- 46 yo never smoker followed for allergic rhinitis and allergic asthma, complicated by chronic musculoskeletal pain after an injury. FOLLOWS FOR: pain in center chest area-doesn't feel full like congestion.   Still on allergy vaccine 1:10 GH and no reactions.  Blames spring pollen for some increase in his chronic sense of right upper anterior chest discomfort, noticed mostly while mowing his lawn with a self-propelled mower. Discomfort is increased with deep breath and he says it is  clearly related to his breathing, not the amount of physical effort. He continues chronic pain clinic followup, polyneuropathy. CXR 09/28/12 IMPRESSION:  1. Hyperexpanded lungs and chronic bronchitic change without  definite acute  cardiopulmonary disease.  2. Borderline enlarged cardiac silhouette with prominence of the  central pulmonary vasculature, nonspecific but may be seen in the  setting of pulmonary arterial hypertension. Further evaluation  with cardiac echo may be performed as clinically indicated.  Original Report Authenticated By: Jake Seats, MD 2D Echo-09/29/12-EF 65-70, mild LVH, grade 1 diastolic dysfunction, mild bi-atrial dilatation without pulmonary hypertension.  09/29/13- 88 yo never smoker followed for allergic rhinitis and allergic asthma, complicated by chronic musculoskeletal pain after an injury. FOLLOWS FOR: still on Allergy vaccine 1:10 GH and doing well; has had flare up of congestion,sore throat, drainage-took Allegra and helped. Needs refills on all medications. Had flu vaccine but worries that he might have the flu because of 2 or 3 days of watery rhinorrhea, sore throat, dry cough and sneeze. Low-grade fever.  03/28/14- 84 yo never smoker followed for allergic rhinitis and allergic asthma/ bronchitis, complicated by chronic musculoskeletal pain after an injury, GERD. FOLLOWS FOR: still on vaccine and wonders if it helps but does not want to come off of it out of fear that he will get bad. Runny nose, itchy and watery eyes. Allergy vaccine 1:10 GH Morning cough and postnasal drip bothering more now than during Spring pollen. Some wheeze or spasm upper substernal area. Occasional  Food hangup upper third esophagus. Strangles easily.  Increased pain meds after R shoulder surgery (? Intubation for GOT).   07/31/14-63 yo never smoker followed for allergic rhinitis and allergic asthma/ bronchitis, complicated by chronic musculoskeletal pain after an injury, GERD. FOLLOW FOR: coughing up yellow mucus, chest pain/tightness - had barium swallow done in June 2015, was diagnosed with GERD, still having trouble with the chest tightness despite taking Omeprazole. Substernal discomfort, sometimes feels like  reflux, present much of the time but worse with overexertion and bending. Omeprazole was insufficient help. Barium Swallow 04/04/14 IMPRESSION:  1. No esophageal stricture or upper esophageal web. Mild dysmotility  distal esophagus with tertiary contractions. No hiatal hernia.  2. Moderate gastroesophageal reflux to the level of mid esophagus.  Electronically Signed  By: Lahoma Crocker M.D.  On: 04/04/2014 14:51   12/04/14- 64 yo never smoker followed for allergic rhinitis and allergic asthma/ bronchitis, complicated by chronic musculoskeletal pain after an injury, GERD. FOLLOWS FOR: pt did see gastro and was prescribed Protonix 40 mg. He has seen a difference. Pt c/o cough with grayish yellow mucus, wheezing and chest tightness. Pt has had some sob. He never had endoscopy recommended by Dr Deatra Ina, but is using the Protonix. No change so far in chronic cough. Continues allergy vaccine 1:10 GH. Frequent rhinorrhea triggered especially by cold air.  11/12/2015-65 year old male never smoker followed for allergic rhinitis, allergic asthma/bronchitis, complicated by chronic musculoskeletal pain after an injury, GERD Allergy vaccine 1:10 GH FOLLOWS FOR: Still on allergy vaccine; PND,itchy, watery eyes. Coughing-productive-gray in color-early in the morning time. Recent nasal congestion without obvious respiratory infection. Using a chainsaw yesterday and detailed a lot of dust. Is using his Advair regularly and pro air 2 or 3 times daily for this. CXR 08/21/2015 IMPRESSION: No edema or consolidation. Slight bibasilar atelectatic change. Electronically Signed  By: Lowella Grip III M.D.  On: 08/21/2015 11:30  ROS-see HPI Constitutional:   No-   weight loss, night sweats, fevers, chills, fatigue, lassitude. HEENT:  No-  headaches, difficulty swallowing, tooth/dental problems, +sore throat,       +sneezing, itching, ear ache, nasal congestion, +post nasal drip,  CV:  +chest pain, no-orthopnea,  PND, swelling in lower extremities, anasarca,  dizziness, palpitations Resp: No-   shortness of breath with exertion or at rest.              No-   productive cough,  + non-productive cough,  No- coughing up of blood.              No-   change in color of mucus.  +wheezing.   Skin: No-   rash or lesions. GI:  No-   heartburn, indigestion, abdominal pain, nausea, vomiting,  GU: . MS: +  joint pain or swelling.  No- decreased range of motion.  + back pain. Neuro-     nothing unusual Psych:  No- change in mood or affect. + depression or anxiety.  No memory loss.  OBJ- Physical Exam General- Alert, Oriented, Affect-appropriate, Distress- none acute. Big/ muscular Skin- rash-none, lesions- none, excoriation- none Lymphadenopathy- none Head- atraumatic            Eyes- Gross vision intact, PERRLA, conjunctivae and secretions clear            Ears- Hearing, canals-normal            Nose- Clear, no-Septal dev, mucus, polyps, erosion, perforation             Throat- Mallampati II , mucosa clear(throat lozenges) , drainage- none, tonsils- atrophic Neck- flexible , trachea midline, no stridor , thyroid nl, carotid no bruit Chest - symmetrical excursion , unlabored           Heart/CV- RRR , no murmur , no gallop  , no rub, nl s1 s2                           - JVD- none , edema- none, stasis changes- none, varices- none           Lung- clear, wheeze + + , dullness-none, rub- none           Chest wall- not obviously tender Abd-  Br/ Gen/ Rectal- Not done, not indicated Extrem- cyanosis- none, clubbing, none, atrophy- none, strength- muscular. + Brace L lower leg, + cane Neuro- grossly intact to observation

## 2015-11-12 NOTE — Patient Instructions (Signed)
Scripts refilled, including Advair sent  Please call if your increased wheeze and chest congestion today don't clear up

## 2015-11-15 ENCOUNTER — Ambulatory Visit (INDEPENDENT_AMBULATORY_CARE_PROVIDER_SITE_OTHER): Payer: Medicare Other

## 2015-11-15 DIAGNOSIS — J309 Allergic rhinitis, unspecified: Secondary | ICD-10-CM

## 2015-11-16 ENCOUNTER — Encounter: Payer: Medicare Other | Attending: Physical Medicine & Rehabilitation | Admitting: Registered Nurse

## 2015-11-16 ENCOUNTER — Encounter: Payer: Self-pay | Admitting: Registered Nurse

## 2015-11-16 VITALS — BP 120/62 | HR 65 | Resp 14

## 2015-11-16 DIAGNOSIS — M47817 Spondylosis without myelopathy or radiculopathy, lumbosacral region: Secondary | ICD-10-CM | POA: Diagnosis not present

## 2015-11-16 DIAGNOSIS — G894 Chronic pain syndrome: Secondary | ICD-10-CM | POA: Diagnosis not present

## 2015-11-16 DIAGNOSIS — IMO0001 Reserved for inherently not codable concepts without codable children: Secondary | ICD-10-CM

## 2015-11-16 DIAGNOSIS — Z79899 Other long term (current) drug therapy: Secondary | ICD-10-CM

## 2015-11-16 DIAGNOSIS — S8410XS Injury of peroneal nerve at lower leg level, unspecified leg, sequela: Secondary | ICD-10-CM

## 2015-11-16 DIAGNOSIS — Z5181 Encounter for therapeutic drug level monitoring: Secondary | ICD-10-CM

## 2015-11-16 DIAGNOSIS — M791 Myalgia: Secondary | ICD-10-CM

## 2015-11-16 DIAGNOSIS — M609 Myositis, unspecified: Secondary | ICD-10-CM

## 2015-11-16 MED ORDER — CELECOXIB 200 MG PO CAPS: 200.0000 mg | ORAL_CAPSULE | Freq: Every day | ORAL | Status: DC

## 2015-11-16 MED ORDER — HYDROCODONE-ACETAMINOPHEN 10-325 MG PO TABS: 1.0000 | ORAL_TABLET | Freq: Four times a day (QID) | ORAL | Status: DC | PRN

## 2015-11-16 MED ORDER — GABAPENTIN 300 MG PO CAPS: ORAL_CAPSULE | ORAL | Status: DC
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
## 2015-11-22 ENCOUNTER — Ambulatory Visit (INDEPENDENT_AMBULATORY_CARE_PROVIDER_SITE_OTHER): Payer: Medicare Other

## 2015-11-22 DIAGNOSIS — J309 Allergic rhinitis, unspecified: Secondary | ICD-10-CM

## 2015-11-23 LAB — TOXASSURE SELECT,+ANTIDEPR,UR: PDF: 0

## 2015-11-23 NOTE — Progress Notes (Signed)
Urine drug screen for this encounter is consistent for prescribed medication 

## 2015-11-29 ENCOUNTER — Ambulatory Visit (INDEPENDENT_AMBULATORY_CARE_PROVIDER_SITE_OTHER): Payer: Medicare Other

## 2015-11-29 DIAGNOSIS — J309 Allergic rhinitis, unspecified: Secondary | ICD-10-CM

## 2015-12-02 NOTE — Assessment & Plan Note (Signed)
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

## 2015-12-02 NOTE — Assessment & Plan Note (Signed)
Mild exacerbation of nasal congestion, so far without obvious infection. Suggested Flonase

## 2015-12-06 ENCOUNTER — Ambulatory Visit (INDEPENDENT_AMBULATORY_CARE_PROVIDER_SITE_OTHER): Payer: Medicare Other

## 2015-12-06 DIAGNOSIS — J309 Allergic rhinitis, unspecified: Secondary | ICD-10-CM | POA: Diagnosis not present

## 2015-12-13 ENCOUNTER — Ambulatory Visit (INDEPENDENT_AMBULATORY_CARE_PROVIDER_SITE_OTHER): Payer: Medicare Other

## 2015-12-13 ENCOUNTER — Ambulatory Visit (INDEPENDENT_AMBULATORY_CARE_PROVIDER_SITE_OTHER): Payer: Medicare Other | Admitting: Family Medicine

## 2015-12-13 VITALS — BP 120/80 | HR 64 | Temp 98.5°F | Resp 20 | Ht 74.0 in | Wt 218.2 lb

## 2015-12-13 DIAGNOSIS — J309 Allergic rhinitis, unspecified: Secondary | ICD-10-CM | POA: Diagnosis not present

## 2015-12-13 DIAGNOSIS — R1031 Right lower quadrant pain: Secondary | ICD-10-CM

## 2015-12-13 LAB — POCT CBC
Granulocyte percent: 77.4 %G (ref 37–80)
HCT, POC: 43.8 % (ref 43.5–53.7)
Hemoglobin: 15.3 g/dL (ref 14.1–18.1)
Lymph, poc: 1.6 (ref 0.6–3.4)
MCH, POC: 31.9 pg — AB (ref 27–31.2)
MCHC: 34.9 g/dL (ref 31.8–35.4)
MCV: 91.6 fL (ref 80–97)
MID (cbc): 0.5 (ref 0–0.9)
MPV: 8.4 fL (ref 0–99.8)
POC Granulocyte: 7.3 — AB (ref 2–6.9)
POC LYMPH PERCENT: 17.5 %L (ref 10–50)
POC MID %: 5.1 %M (ref 0–12)
Platelet Count, POC: 126 10*3/uL — AB (ref 142–424)
RBC: 4.78 M/uL (ref 4.69–6.13)
RDW, POC: 13.1 %
WBC: 9.4 10*3/uL (ref 4.6–10.2)

## 2015-12-13 LAB — POC MICROSCOPIC URINALYSIS (UMFC): Mucus: ABSENT

## 2015-12-13 LAB — POCT URINALYSIS DIP (MANUAL ENTRY)
Bilirubin, UA: NEGATIVE
Blood, UA: NEGATIVE
Glucose, UA: NEGATIVE
Ketones, POC UA: NEGATIVE
Leukocytes, UA: NEGATIVE
Nitrite, UA: NEGATIVE
Protein Ur, POC: NEGATIVE
Spec Grav, UA: 1.02
Urobilinogen, UA: 0.2
pH, UA: 6

## 2015-12-13 NOTE — Patient Instructions (Signed)
-   Please use Miralax once daily today and tomorrow. We will be working on scheduling a CT which should happen Saturday or Sunday. If it has not happened by then, please call us and let us know.  Abdominal Pain, Adult Many things can cause abdominal pain. Usually, abdominal pain is not caused by a disease and will improve without treatment. It can often be observed and treated at home. Your health care provider will do a physical exam and possibly order blood tests and X-rays to help determine the seriousness of your pain. However, in many cases, more time must pass before a clear cause of the pain can be found. Before that point, your health care provider may not know if you need more testing or further treatment. HOME CARE INSTRUCTIONS Monitor your abdominal pain for any changes. The following actions may help to alleviate any discomfort you are experiencing:  Only take over-the-counter or prescription medicines as directed by your health care provider.  Do not take laxatives unless directed to do so by your health care provider.  Try a clear liquid diet (broth, tea, or water) as directed by your health care provider. Slowly move to a bland diet as tolerated. SEEK MEDICAL CARE IF:  You have unexplained abdominal pain.  You have abdominal pain associated with nausea or diarrhea.  You have pain when you urinate or have a bowel movement.  You experience abdominal pain that wakes you in the night.  You have abdominal pain that is worsened or improved by eating food.  You have abdominal pain that is worsened with eating fatty foods.  You have a fever. SEEK IMMEDIATE MEDICAL CARE IF:  Your pain does not go away within 2 hours.  You keep throwing up (vomiting).  Your pain is felt only in portions of the abdomen, such as the right side or the left lower portion of the abdomen.  You pass bloody or black tarry stools. MAKE SURE YOU:  Understand these instructions.  Will watch your  condition.  Will get help right away if you are not doing well or get worse.   This information is not intended to replace advice given to you by your health care provider. Make sure you discuss any questions you have with your health care provider.   Document Released: 07/23/2005 Document Revised: 07/04/2015 Document Reviewed: 06/22/2013 Elsevier Interactive Patient Education [employer].

## 2015-12-13 NOTE — Progress Notes (Signed)
MRN: [id number] DOB: May 29, 1951  Subjective:   Kristopher Gay is a 65 y.o. male presenting for chief complaint of other  Reports ~5 day history of RLQ pain sometimes radiates to his RUQ. Admits having constipation this morning, strained to have a bowel movement. Denies fever, n/v, diarrhea, bloody stools, trauma, history of belly surgeries, heavy lifting. Patient is disabled due to DDD, takes opioid and gabapentin for this. He has never had a colonoscopy. He is not opposed to doing this but did not want an endoscopy done.  Kristopher Gay is currently taking celecoxib, hydrocodone, gabapentin, Protonix, Advair Diskus.   Kristopher Gay  has a past medical history of Unspecified asthma(493.90); Allergic rhinitis, cause unspecified; Extrinsic asthma, unspecified; DDD (degenerative disc disease); Neuromuscular disorder ([place]); Anxiety; Chronic headaches; Depression; COPD (chronic obstructive pulmonary disease) ([place]); Blood clot in vein; Allergy; and Emphysema of lung ([place]). Also  has past surgical history that includes DVT and vein stripping left calf without PE (2002); Rotator cuff repair (Left, 2005); and Shoulder surgery (Right, 2014).  Objective:   Vitals: BP 120/80 mmHg  Pulse 64  Temp(Src) 98.5 F (36.9 C) (Oral)  Resp 20  Ht 6\' 2"  (1.88 m)  Wt 218 lb 3.2 oz (98.975 kg)  BMI 28.00 kg/m2  SpO2 97%  Physical Exam  Constitutional: [name], place, and time. He appears well-developed and well-nourished.  HENT:  Mouth/Throat: Oropharynx is clear and moist.  Eyes: Right eye exhibits no discharge. Left eye exhibits no discharge. No scleral icterus.  Cardiovascular: Normal rate, regular rhythm and intact distal pulses.  Exam reveals no gallop and no friction rub.   No murmur heard. Pulmonary/Chest: No respiratory distress. He has no wheezes. He has no rales.  Abdominal: Soft. Bowel sounds are normal. He exhibits no distension and no mass. There is tenderness (right sided, worst over RLQ with  deep palpation).  Negative Rovsing, Psoas sign.  Neurological: He is alert and oriented to person, place, and time.  Skin: Skin is warm and dry.   Results for orders placed or performed in visit on 12/13/15 (from the past 24 hour(s))  POCT CBC     Status: Abnormal   Collection Time: 12/13/15  7:00 PM  Result Value Ref Range   WBC 9.4 4.6 - 10.2 K/uL   Lymph, poc 1.6 0.6 - 3.4   POC LYMPH PERCENT 17.5 10 - 50 %L   MID (cbc) 0.5 0 - 0.9   POC MID % 5.1 0 - 12 %M   POC Granulocyte 7.3 (A) 2 - 6.9   Granulocyte percent 77.4 37 - 80 %G   RBC 4.78 4.69 - 6.13 M/uL   Hemoglobin 15.3 14.1 - 18.1 g/dL   HCT, POC 43.8 43.5 - 53.7 %   MCV 91.6 80 - 97 fL   MCH, POC 31.9 (A) 27 - 31.2 pg   MCHC 34.9 31.8 - 35.4 g/dL   RDW, POC 13.1 %   Platelet Count, POC 126 (A) 142 - 424 K/uL   MPV 8.4 0 - 99.8 fL  POCT urinalysis dipstick     Status: Normal   Collection Time: 12/13/15  7:02 PM  Result Value Ref Range   Color, UA yellow yellow   Clarity, UA clear clear   Glucose, UA negative negative   Bilirubin, UA negative negative   Ketones, POC UA negative negative   Spec Grav, UA 1.020    Blood, UA negative negative   pH, UA 6.0    Protein  Ur, POC negative negative   Urobilinogen, UA 0.2    Nitrite, UA Negative Negative   Leukocytes, UA Negative Negative  POCT Microscopic Urinalysis ([facility])     Status: Abnormal   Collection Time: 12/13/15  7:02 PM  Result Value Ref Range   WBC,UR,HPF,POC Few (A) None WBC/hpf   RBC,UR,HPF,POC Few (A) None RBC/hpf   Bacteria None None, Too numerous to count   Mucus Absent Absent   Epithelial Cells, UR Per Microscopy Few (A) None, Too numerous to count cells/hpf   Dg Abd 1 View  12/13/2015  CLINICAL DATA:  Right lower quadrant abdominal pain EXAM: ABDOMEN - 1 VIEW COMPARISON:  None. FINDINGS: Scattered large and small bowel gas is noted. Fecal material is noted throughout the right colon. The spleen appears enlarged. Correlation with physical exam is  recommended. Degenerative changes of the lumbar spine are seen. IMPRESSION: Changes suggestive of splenomegaly Nonobstructive bowel gas pattern. Electronically Signed   By: Inez Catalina M.D.   On: 12/13/2015 18:48   Assessment and Plan :   This case was precepted with Dr. Carlota Raspberry.  1. RLQ abdominal pain - Unclear source, CT pending. Recommended patient use Miralax daily to address any possible constipation given his chronic opioid use. Labs pending. Patient to call us if he has not been scheduled for CT by Sunday.  Jaynee Eagles, PA-C [facility] [phone] 12/13/2015 6:07 PM

## 2015-12-14 LAB — COMPREHENSIVE METABOLIC PANEL
ALT: 25 U/L (ref 9–46)
AST: 25 U/L (ref 10–35)
Albumin: 3.9 g/dL (ref 3.6–5.1)
Alkaline Phosphatase: 70 U/L (ref 40–115)
BUN: 22 mg/dL (ref 7–25)
CO2: 27 mmol/L (ref 20–31)
Calcium: 9 mg/dL (ref 8.6–10.3)
Chloride: 102 mmol/L (ref 98–110)
Creat: 1.08 mg/dL (ref 0.70–1.25)
Glucose, Bld: 95 mg/dL (ref 65–99)
Potassium: 4.5 mmol/L (ref 3.5–5.3)
Sodium: 138 mmol/L (ref 135–146)
Total Bilirubin: 0.6 mg/dL (ref 0.2–1.2)
Total Protein: 6.7 g/dL (ref 6.1–8.1)

## 2015-12-14 LAB — LIPASE: Lipase: 31 U/L (ref 7–60)

## 2015-12-17 ENCOUNTER — Telehealth: Payer: Self-pay

## 2015-12-17 ENCOUNTER — Encounter: Payer: Medicare Other | Attending: Physical Medicine & Rehabilitation | Admitting: Registered Nurse

## 2015-12-17 ENCOUNTER — Encounter: Payer: Self-pay | Admitting: Registered Nurse

## 2015-12-17 VITALS — BP 144/68 | HR 70

## 2015-12-17 DIAGNOSIS — G894 Chronic pain syndrome: Secondary | ICD-10-CM

## 2015-12-17 DIAGNOSIS — Z5181 Encounter for therapeutic drug level monitoring: Secondary | ICD-10-CM

## 2015-12-17 DIAGNOSIS — M47817 Spondylosis without myelopathy or radiculopathy, lumbosacral region: Secondary | ICD-10-CM

## 2015-12-17 DIAGNOSIS — S8410XS Injury of peroneal nerve at lower leg level, unspecified leg, sequela: Secondary | ICD-10-CM | POA: Diagnosis not present

## 2015-12-17 DIAGNOSIS — M791 Myalgia: Secondary | ICD-10-CM | POA: Diagnosis not present

## 2015-12-17 DIAGNOSIS — Z79899 Other long term (current) drug therapy: Secondary | ICD-10-CM | POA: Diagnosis present

## 2015-12-17 DIAGNOSIS — IMO0001 Reserved for inherently not codable concepts without codable children: Secondary | ICD-10-CM

## 2015-12-17 DIAGNOSIS — M609 Myositis, unspecified: Secondary | ICD-10-CM

## 2015-12-17 MED ORDER — HYDROCODONE-ACETAMINOPHEN 10-325 MG PO TABS: 1.0000 | ORAL_TABLET | Freq: Four times a day (QID) | ORAL | Status: DC | PRN

## 2015-12-17 MED ORDER — GABAPENTIN 300 MG PO CAPS: ORAL_CAPSULE | ORAL | Status: DC

## 2015-12-17 MED ORDER — CELECOXIB 200 MG PO CAPS: 200.0000 mg | ORAL_CAPSULE | Freq: Every day | ORAL | Status: DC

## 2015-12-17 NOTE — Telephone Encounter (Signed)
Pt is returning our call about his labs, states that a voicemail was left but ended before the message was complete  Best number [phone]
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
## 2015-12-18 NOTE — Telephone Encounter (Signed)
Patient came by and spoke with [facility] on 12/17/15 and received his lab results at that time.

## 2015-12-19 ENCOUNTER — Ambulatory Visit (INDEPENDENT_AMBULATORY_CARE_PROVIDER_SITE_OTHER): Payer: Medicare Other | Admitting: Family Medicine

## 2015-12-19 ENCOUNTER — Other Ambulatory Visit: Payer: Self-pay | Admitting: Urgent Care

## 2015-12-19 ENCOUNTER — Inpatient Hospital Stay: Admission: RE | Admit: 2015-12-19 | Payer: Medicare Other | Source: Ambulatory Visit

## 2015-12-19 ENCOUNTER — Ambulatory Visit
Admission: RE | Admit: 2015-12-19 | Discharge: 2015-12-19 | Disposition: A | Discharge status: Home / Self Care | Payer: Medicare Other | Source: Ambulatory Visit | Attending: Urgent Care | Admitting: Urgent Care

## 2015-12-19 ENCOUNTER — Telehealth: Payer: Self-pay

## 2015-12-19 VITALS — BP 138/77 | HR 66 | Temp 98.8°F | Resp 12 | Wt 219.0 lb

## 2015-12-19 DIAGNOSIS — D3502 Benign neoplasm of left adrenal gland: Secondary | ICD-10-CM | POA: Diagnosis not present

## 2015-12-19 DIAGNOSIS — N400 Enlarged prostate without lower urinary tract symptoms: Secondary | ICD-10-CM | POA: Diagnosis not present

## 2015-12-19 DIAGNOSIS — R1031 Right lower quadrant pain: Secondary | ICD-10-CM

## 2015-12-19 DIAGNOSIS — K5900 Constipation, unspecified: Secondary | ICD-10-CM | POA: Diagnosis not present

## 2015-12-19 DIAGNOSIS — M419 Scoliosis, unspecified: Secondary | ICD-10-CM

## 2015-12-19 DIAGNOSIS — M4126 Other idiopathic scoliosis, lumbar region: Secondary | ICD-10-CM | POA: Diagnosis not present

## 2015-12-19 MED ORDER — IOPAMIDOL (ISOVUE-300) INJECTION 61%
100.0000 mL | Freq: Once | INTRAVENOUS | Status: AC | PRN
  Administered 2015-12-19: 125 mL via INTRAVENOUS

## 2015-12-19 NOTE — Patient Instructions (Signed)
I recommend doing a miralax clean-out. Put 14 (not kidding) doses of miralax (polyethylene glycol) into 64 oz of any clear, non-carbonated liquid (apple juice, gatorade, water) and drink this WITHIN 24 HOURS!!!! For the next 2d, plan to stay home and just hang around the toilet as you will hopefully be having 8 BM/day of LIQUID stool.  If the diarrhea occurs soon after starting process without passing a significant stool volume or if you are having any fecal incontinence you should keep going as this may be the initial miralax washing around the larger stools.  Full Liquid Diet A full liquid diet may be used:   To help you transition from a clear liquid diet to a soft diet.   When your body is healing and can only tolerate foods that are easy to digest.  Before or after certain a procedure, test, or surgery (such as stomach or intestinal surgeries).   If you have trouble swallowing or chewing.  A full liquid diet includes fluids and foods that are liquid or will become liquid at room temperature. The full liquid diet gives you the proteins, fluids, salts, and minerals that you need for energy. If you continue this diet for more than 72 hours, talk to your health care provider about how many calories you need to consume. If you continue the diet for more than 5 days, talk to your health care provider about taking a multivitamin or a nutritional supplement. WHAT DO I NEED TO KNOW ABOUT A FULL LIQUID DIET?  You may have any liquid.  You may have any food that becomes a liquid at room temperature. The food is considered a liquid if it can be poured off a spoon at room temperature.  Drink one serving of citrus or vitamin C-enriched fruit juice daily. WHAT FOODS CAN I EAT? Grains Any grain food that can be pureed in soup (such as crackers, pasta, and rice). Hot cereal (such as farina or oatmeal) that has been blended. Talk to your health care provider or dietitian about these  foods. Vegetables Pulp-free tomato or vegetable juice. Vegetables pureed in soup.  Fruits Fruit juice, including nectars and juices with pulp. Meats and Other Protein Sources Eggs in custard, eggnog mix, and eggs used in ice cream or pudding. Strained meats, like in baby food, may be allowed. Consult your health care provider.  Dairy Milk and milk-based beverages, including milk shakes and instant breakfast mixes. Smooth yogurt. Pureed cottage cheese. Avoid these foods if they are not well tolerated. Beverages All beverages, including liquid nutritional supplements. Ask your health care provider if you can have carbonated beverages. They may not be well tolerated. Condiments Iodized salt, pepper, spices, and flavorings. Cocoa powder. Vinegar, ketchup, yellow mustard, smooth sauces (such as hollandaise, cheese sauce, or white sauce), and soy sauce. Sweets and Desserts Custard, smooth pudding. Flavored gelatin. Tapioca, junket. Plain ice cream, sherbet, fruit ices. Frozen ice pops, frozen fudge pops, pudding pops, and other frozen bars with cream. Syrups, including chocolate syrup. Sugar, honey, jelly.  Fats and Oils Margarine, butter, cream, sour cream, and oils. Other Broth and cream soups. Strained, broth-based soups. The items listed above may not be a complete list of recommended foods or beverages. Contact your dietitian for more options.  WHAT FOODS CAN I NOT EAT? Grains All breads. Grains are not allowed unless they are pureed into soup. Vegetables Vegetables are not allowed unless they are juiced, or cooked and pureed into soup. Fruits Fruits are not allowed unless they  are juiced. Meats and Other Protein Sources Any meat or fish. Cooked or raw eggs. Nut butters.  Dairy Cheese.  Condiments Stone ground mustards. Fats and Oils Fats that are coarse or chunky. Sweets and Desserts Ice cream or other frozen desserts that have any solids in them or on top, such as nuts, chocolate  chips, and pieces of cookies. Cakes. Cookies. Candy. Others Soups with chunks or pieces in them. The items listed above may not be a complete list of foods and beverages to avoid. Contact your dietitian for more information.   This information is not intended to replace advice given to you by your health care provider. Make sure you discuss any questions you have with your health care provider.   Document Released: 10/13/2005 Document Revised: 10/18/2013 Document Reviewed: 08/18/2013 Elsevier Interactive Patient Education 2016 [employer].   Constipation, Adult Constipation is when a person has fewer than three bowel movements a week, has difficulty having a bowel movement, or has stools that are dry, hard, or larger than normal. As people grow older, constipation is more common. A low-fiber diet, not taking in enough fluids, and taking certain medicines may make constipation worse.  CAUSES   Certain medicines, such as antidepressants, pain medicine, iron supplements, antacids, and water pills.   Certain diseases, such as diabetes, irritable bowel syndrome (IBS), thyroid disease, or depression.   Not drinking enough water.   Not eating enough fiber-rich foods.   Stress or travel.   Lack of physical activity or exercise.   Ignoring the urge to have a bowel movement.   Using laxatives too much.  SIGNS AND SYMPTOMS   Having fewer than three bowel movements a week.   Straining to have a bowel movement.   Having stools that are hard, dry, or larger than normal.   Feeling full or bloated.   Pain in the lower abdomen.   Not feeling relief after having a bowel movement.  DIAGNOSIS  Your health care provider will take a medical history and perform a physical exam. Further testing may be done for severe constipation. Some tests may include:  A barium enema X-ray to examine your rectum, colon, and, sometimes, your small intestine.   A sigmoidoscopy to examine  your lower colon.   A colonoscopy to examine your entire colon. TREATMENT  Treatment will depend on the severity of your constipation and what is causing it. Some dietary treatments include drinking more fluids and eating more fiber-rich foods. Lifestyle treatments may include regular exercise. If these diet and lifestyle recommendations do not help, your health care provider may recommend taking over-the-counter laxative medicines to help you have bowel movements. Prescription medicines may be prescribed if over-the-counter medicines do not work.  HOME CARE INSTRUCTIONS   Eat foods that have a lot of fiber, such as fruits, vegetables, whole grains, and beans.  Limit foods high in fat and processed sugars, such as french fries, hamburgers, cookies, candies, and soda.   A fiber supplement may be added to your diet if you cannot get enough fiber from foods.   Drink enough fluids to keep your urine clear or pale yellow.   Exercise regularly or as directed by your health care provider.   Go to the restroom when you have the urge to go. Do not hold it.   Only take over-the-counter or prescription medicines as directed by your health care provider. Do not take other medicines for constipation without talking to your health care provider first.  SEEK IMMEDIATE  MEDICAL CARE IF:   You have bright red blood in your stool.   Your constipation lasts for more than 4 days or gets worse.   You have abdominal or rectal pain.   You have thin, pencil-like stools.   You have unexplained weight loss. MAKE SURE YOU:   Understand these instructions.  Will watch your condition.  Will get help right away if you are not doing well or get worse.   This information is not intended to replace advice given to you by your health care provider. Make sure you discuss any questions you have with your health care provider.   Document Released: 07/11/2004 Document Revised: 11/03/2014 Document  Reviewed: 07/25/2013 Elsevier Interactive Patient Education 2016 [employer].  About Constipation  Constipation Overview Constipation is the most common gastrointestinal complaint - about 4 million Americans experience constipation and make 2.5 million physician visits a year to get help for the problem.  Constipation can occur when the colon absorbs too much water, the colon's muscle contraction is slow or sluggish, and/or there is delayed transit time through the colon.  The result is stool that is hard and dry.  Indicators of constipation include straining during bowel movements greater than 25% of the time, having fewer than three bowel movements per week, and/or the feeling of incomplete evacuation.  There are established guidelines ([place] II ) for defining constipation. A person needs to have two or more of the following symptoms for at least 12 weeks (not necessarily consecutive) in the preceding 12 months: . Straining in  greater than 25% of bowel movements . Lumpy or hard stools in greater than 25% of bowel movements . Sensation of incomplete emptying in greater than 25% of bowel movements . Sensation of anorectal obstruction/blockade in greater than 25% of bowel movements . Manual maneuvers to help empty greater than 25% of bowel movements (e.g., digital evacuation, support of the pelvic floor)  . Less than  3 bowel movements/week . Loose stools are not present, and criteria for irritable bowel syndrome are insufficient  Common Causes of Constipation . Lack of fiber in your diet . Lack of physical activity . Medications, including iron and calcium supplements  . Dairy intake . Dehydration . Abuse of laxatives  Travel  Irritable Bowel Syndrome  Pregnancy  Luteal phase of menstruation (after ovulation and before menses)  Colorectal problems  Intestinal Dysfunction  Treating Constipation  There are several ways of treating constipation, including changes to diet and  exercise, use of laxatives, adjustments to the pelvic floor, and scheduled toileting.  These treatments include: . increasing fiber and fluids in the diet  . increasing physical activity . learning muscle coordination   learning proper toileting techniques and toileting modifications   designing and sticking  to a toileting schedule     2007, [employer] Doc.22

## 2015-12-19 NOTE — Telephone Encounter (Signed)
Pt has more questions regarding his CT scan. Have Freida Busman to contact him back  [phone]

## 2015-12-19 NOTE — Progress Notes (Signed)
Subjective:    Patient ID: Kristopher Gay, male    DOB: 1951/04/07, 65 y.o.   MRN: [id number] Chief Complaint  Patient presents with  . Constipation    HPI  Patient was seen 6d ago by my colleague Bess Harvest PA-C for acute RLQ abdominal pain.  Very thorough work-up including labs, xray and CT scan today when pain persisted. Pt tried a dose for miralax for several days but did not help at all and then tried a dulcolax po also without effect. His abd pain is worsening - he is quite uncomfortable and is willing to attempt just about anything to get relief. Remembers when her was here many years ago, Dr. Laney Pastor gave him instructions on how to do a complete clean-out - like when one prepares for a colonoscopy - and he would like to try the same thing but doesn't remember what it was.  He does not feel like her has an urge to defecate or that anything is stuck at his rectum. He has never used any suppositories or enemas.  He does not follow closely with his PCP and does not remember when his last psa was. No urinary complaints.  Past Medical History  Diagnosis Date  . Unspecified asthma(493.90)   . Allergic rhinitis, cause unspecified   . Extrinsic asthma, unspecified   . DDD (degenerative disc disease)   . Neuromuscular disorder ([place])   . Anxiety   . Chronic headaches   . Depression   . COPD (chronic obstructive pulmonary disease) ([place])   . Blood clot in vein     left calf  . Allergy   . Emphysema of lung [facility])    Current Outpatient Prescriptions on File Prior to Visit  Medication Sig Dispense Refill  . albuterol (PROAIR HFA) 108 (90 Base) MCG/ACT inhaler Inhale 2 puffs into the lungs every 4 (four) hours as needed for wheezing or shortness of breath. 1 Inhaler prn  . celecoxib (CELEBREX) 200 MG capsule Take 1 capsule (200 mg total) by mouth daily. 30 capsule 2  . EPINEPHrine (EPIPEN 2-PAK) 0.3 mg/0.3 mL SOAJ injection Inject 0.3 mg into the muscle once. Reported on 12/13/2015    .  Fluticasone-Salmeterol (ADVAIR DISKUS) 500-50 MCG/DOSE AEPB Inhale 1 puff, then rinse mouth, twice daily 60 each 12  . gabapentin (NEURONTIN) 300 MG capsule TAKE 2 CAPSULES BY MOUTH EVERY MORNING, 1 IN THE AFTERNOON AND 2 EVERY NIGHT AT BEDTIME 150 capsule 2  . HYDROcodone-acetaminophen (NORCO) 10-325 MG tablet Take 1 tablet by mouth every 6 (six) hours as needed. 120 tablet 0  . Ascorbic Acid (VITAMIN C) 1000 MG tablet Take 1,000 mg by mouth 2 (two) times daily. Reported on 12/19/2015    . Cholecalciferol (VITAMIN D-3) 5000 UNITS TABS Take 1 tablet by mouth daily. Reported on 12/19/2015    . DHEA 25 MG CAPS Take 1 capsule by mouth daily. Reported on 12/19/2015    . etodolac (LODINE) 400 MG tablet Take 400 mg by mouth 2 (two) times daily. Reported on 12/19/2015    . Glucosamine-Chondroit-Vit C-Mn (GLUCOSAMINE 1500 COMPLEX) CAPS Take 1 capsule by mouth daily. Reported on 12/19/2015    . Multiple Vitamin (MULTIVITAMIN) tablet Take 1 tablet by mouth daily. Reported on 12/19/2015    . Omega-3 Fatty Acids (FISH OIL) 1000 MG CAPS Take 1 capsule by mouth daily. Reported on 12/19/2015    . pantoprazole (PROTONIX) 40 MG tablet Take 1 tablet 30 minutes prior to breakfast. (Patient not taking: Reported on 12/19/2015) 30  tablet 3  . vitamin E (VITAMIN E) 400 UNIT capsule Take 400 Units by mouth 2 (two) times daily. Reported on 12/19/2015     No current facility-administered medications on file prior to visit.   Allergies  Allergen Reactions  . Bee Venom   . Seasonal Ic [Cholestatin]     Environmental Allergies     Review of Systems  Constitutional: Positive for activity change and appetite change. Negative for fever, chills, diaphoresis and unexpected weight change.  Gastrointestinal: Positive for abdominal pain, constipation and abdominal distention. Negative for nausea, vomiting, diarrhea, blood in stool and anal bleeding.  Genitourinary: Negative for urgency, frequency, hematuria and difficulty urinating.    Musculoskeletal: Positive for back pain and arthralgias.  Skin: Negative for color change and pallor.  Hematological: Negative for adenopathy.  Psychiatric/Behavioral: Positive for sleep disturbance.       Objective:  BP 138/77 mmHg  Pulse 66  Temp(Src) 98.8 F (37.1 C) (Oral)  Resp 12  Wt 219 lb (99.338 kg)  SpO2 97%  Physical Exam  Constitutional: [name], place, and time. He appears well-developed and well-nourished. No distress.  HENT:  Head: Normocephalic and atraumatic.  Eyes: No scleral icterus.  Pulmonary/Chest: Effort normal.  Genitourinary: Prostate normal. Rectal exam shows tenderness. Rectal exam shows no external hemorrhoid, no fissure, no mass and anal tone normal. Prostate is not enlarged and not tender.  No stool in vault  Neurological: He is alert and oriented to person, place, and time.  Skin: Skin is warm and dry. He is not diaphoretic.  Psychiatric: [name] His behavior is normal.          Assessment & Plan:   1. Constipation, unspecified constipation type   Start miralax cleanout - 14 doses within 24 hrs - see AVS.  Recommend liquid diet for the next 2d until cleanout has been successful  2.  Adrenal adenoma - Left gland slightly enlarged - thought likely incidental but rec ct w/o contract or MRI to confirm.  3.  Lumbar scoliosis with diffuse DDD - disabled and on chronic pain medicine?  4.  Slightly prominence of prostate gland noted on pelvic CT - nml on exam today, f/u with PCP to check PSA  Delman Cheadle, MD MPH

## 2015-12-20 ENCOUNTER — Ambulatory Visit (INDEPENDENT_AMBULATORY_CARE_PROVIDER_SITE_OTHER): Payer: Medicare Other

## 2015-12-20 DIAGNOSIS — J309 Allergic rhinitis, unspecified: Secondary | ICD-10-CM

## 2015-12-20 NOTE — Telephone Encounter (Signed)
[name]  Please see previous message 

## 2015-12-21 ENCOUNTER — Telehealth: Payer: Self-pay

## 2015-12-21 DIAGNOSIS — R935 Abnormal findings on diagnostic imaging of other abdominal regions, including retroperitoneum: Secondary | ICD-10-CM

## 2015-12-21 NOTE — Telephone Encounter (Signed)
Kristopher Gay can you please advise on this?

## 2015-12-21 NOTE — Telephone Encounter (Signed)
Dr. Brigitte Pulse saw the patient last but we have both worked with him regarding his RLQ pain. I will put the order in. The CT finding for his enlarged left adrenal gland was incidental and not the source of his belly pain as suggested by the doctor interpreting the CT. Also, this is not an emergent/urgent case and certainly won't provide [place] with a definitive explanation for his right sided pain so he has to wait to get a call for when the CT is scheduled. Lastly, I will recommend he continue follow up with his PCP after this.

## 2015-12-21 NOTE — Telephone Encounter (Signed)
Patient was seen in clinic on 12/19/2015.

## 2015-12-21 NOTE — Telephone Encounter (Signed)
The patient called to ask about a CT scan that was supposed to have been ordered yesterday.  It does not appear to have been placed yet.  He had a CT Abd & Pelvis yesterday, and it is showed an abnormality in the adrenal gland.  A recommendation was made for a CT scan of that area (noted below).  Please advise.  Placing as high priority, as the patient states that he will continue to call until it is done.  2. Enlargement of the left adrenal gland measuring 2.4 x 2.6 cm. This probably represents and incidental adrenal adenoma, but unenhanced CT or MRI to this region would be recommended to confirm that diagnosis.

## 2015-12-22 ENCOUNTER — Encounter (HOSPITAL_COMMUNITY): Payer: Self-pay | Admitting: Emergency Medicine

## 2015-12-22 ENCOUNTER — Emergency Department (HOSPITAL_COMMUNITY)
Admission: EM | Admit: 2015-12-22 | Discharge: 2015-12-22 | Disposition: A | Discharge status: Home / Self Care | Payer: Medicare Other | Attending: Emergency Medicine | Admitting: Emergency Medicine

## 2015-12-22 DIAGNOSIS — F419 Anxiety disorder, unspecified: Secondary | ICD-10-CM | POA: Diagnosis not present

## 2015-12-22 DIAGNOSIS — G8929 Other chronic pain: Secondary | ICD-10-CM | POA: Insufficient documentation

## 2015-12-22 DIAGNOSIS — Z8739 Personal history of other diseases of the musculoskeletal system and connective tissue: Secondary | ICD-10-CM | POA: Insufficient documentation

## 2015-12-22 DIAGNOSIS — J449 Chronic obstructive pulmonary disease, unspecified: Secondary | ICD-10-CM | POA: Diagnosis not present

## 2015-12-22 DIAGNOSIS — K5900 Constipation, unspecified: Secondary | ICD-10-CM | POA: Diagnosis not present

## 2015-12-22 DIAGNOSIS — F329 Major depressive disorder, single episode, unspecified: Secondary | ICD-10-CM | POA: Insufficient documentation

## 2015-12-22 DIAGNOSIS — R1031 Right lower quadrant pain: Secondary | ICD-10-CM | POA: Diagnosis present

## 2015-12-22 DIAGNOSIS — Z791 Long term (current) use of non-steroidal anti-inflammatories (NSAID): Secondary | ICD-10-CM | POA: Diagnosis not present

## 2015-12-22 DIAGNOSIS — Z7951 Long term (current) use of inhaled steroids: Secondary | ICD-10-CM | POA: Diagnosis not present

## 2015-12-22 DIAGNOSIS — Z86718 Personal history of other venous thrombosis and embolism: Secondary | ICD-10-CM | POA: Insufficient documentation

## 2015-12-22 DIAGNOSIS — Z79899 Other long term (current) drug therapy: Secondary | ICD-10-CM | POA: Diagnosis not present

## 2015-12-22 MED ORDER — FLEET ENEMA 7-19 GM/118ML RE ENEM
1.0000 | ENEMA | Freq: Once | RECTAL | Status: AC
  Administered 2015-12-22: 1 via RECTAL
  Filled 2015-12-22: qty 1

## 2015-12-22 MED ORDER — MAGNESIUM CITRATE PO SOLN: 1.0000 | Freq: Once | ORAL | Status: DC

## 2015-12-22 MED ORDER — MILK AND MOLASSES ENEMA
1.0000 | Freq: Once | RECTAL | Status: AC
  Administered 2015-12-22: 250 mL via RECTAL
  Filled 2015-12-22: qty 250

## 2015-12-22 MED ORDER — MAGNESIUM CITRATE PO SOLN
1.0000 | Freq: Once | ORAL | Status: AC
  Administered 2015-12-22: 1 via ORAL
  Filled 2015-12-22: qty 296

## 2015-12-22 NOTE — Telephone Encounter (Signed)
Called pt he did not answer so I left a VM to let him know to try magnesium citrate today and repeat 14 doses of miralax tomorrow.  Please call and check in on pt.

## 2015-12-22 NOTE — ED Notes (Signed)
Pt laying in bed with a bed side commode at bedside. Pt has call bell and was instructed to ring his bell when he felt he was ready to attempt to have a BM.

## 2015-12-22 NOTE — ED Notes (Signed)
PA made aware very small amount of stool after 2nd enema.

## 2015-12-22 NOTE — ED Notes (Signed)
Pt c/o abdominal pain onset since last Sunday. Pt seen at an urgent care and had CT scan. Pt c/o constipation and has tried Miralax without relief.

## 2015-12-22 NOTE — Telephone Encounter (Signed)
Pt called answering service  re ongoing constipation - I received the call at 8 a.m. This morning so did not call pt back since office was open - message was that the miralax was not working.  If he is still constipated I recommend he drink a bottle of magnesium citrate today and then retry the miralax cleanout again tomorrow. He needs to be a low fiber, mainly liquid diet so he is not contributing to the constipation until it is relieved.

## 2015-12-22 NOTE — Discharge Instructions (Signed)
Constipation, Adult Constipation is when a person has fewer than three bowel movements a week, has difficulty having a bowel movement, or has stools that are dry, hard, or larger than normal. As people grow older, constipation is more common. A low-fiber diet, not taking in enough fluids, and taking certain medicines may make constipation worse.  CAUSES   Certain medicines, such as antidepressants, pain medicine, iron supplements, antacids, and water pills.   Certain diseases, such as diabetes, irritable bowel syndrome (IBS), thyroid disease, or depression.   Not drinking enough water.   Not eating enough fiber-rich foods.   Stress or travel.   Lack of physical activity or exercise.   Ignoring the urge to have a bowel movement.   Using laxatives too much.  SIGNS AND SYMPTOMS   Having fewer than three bowel movements a week.   Straining to have a bowel movement.   Having stools that are hard, dry, or larger than normal.   Feeling full or bloated.   Pain in the lower abdomen.   Not feeling relief after having a bowel movement.  DIAGNOSIS  Your health care provider will take a medical history and perform a physical exam. Further testing may be done for severe constipation. Some tests may include:  A barium enema X-ray to examine your rectum, colon, and, sometimes, your small intestine.   A sigmoidoscopy to examine your lower colon.   A colonoscopy to examine your entire colon. TREATMENT  Treatment will depend on the severity of your constipation and what is causing it. Some dietary treatments include drinking more fluids and eating more fiber-rich foods. Lifestyle treatments may include regular exercise. If these diet and lifestyle recommendations do not help, your health care provider may recommend taking over-the-counter laxative medicines to help you have bowel movements. Prescription medicines may be prescribed if over-the-counter medicines do not work.    HOME CARE INSTRUCTIONS   Eat foods that have a lot of fiber, such as fruits, vegetables, whole grains, and beans.  Limit foods high in fat and processed sugars, such as french fries, hamburgers, cookies, candies, and soda.   A fiber supplement may be added to your diet if you cannot get enough fiber from foods.   Drink enough fluids to keep your urine clear or pale yellow.   Exercise regularly or as directed by your health care provider.   Go to the restroom when you have the urge to go. Do not hold it.   Only take over-the-counter or prescription medicines as directed by your health care provider. Do not take other medicines for constipation without talking to your health care provider first.  [place] IF:   You have bright red blood in your stool.   Your constipation lasts for more than 4 days or gets worse.   You have abdominal or rectal pain.   You have thin, pencil-like stools.   You have unexplained weight loss. MAKE SURE YOU:   Understand these instructions.  Will watch your condition.  Will get help right away if you are not doing well or get worse.   This information is not intended to replace advice given to you by your health care provider. Make sure you discuss any questions you have with your health care provider.   Follow-up with gastroenterology for reevaluation of your symptoms. Take magnesium citrate for constipation. Avoid use of narcotics. Encouraged high fiber diet. Return to the emergency department if you experience severe worsening of her symptoms, lack of  bowel movement or passing gas in the next 2 days, severe increase in pain, fever, vomiting, increased weakness.

## 2015-12-22 NOTE — ED Provider Notes (Signed)
CSN: [id number]     Arrival date & time 12/22/15  1051 History   First MD Initiated Contact with Patient 12/22/15 1115     Chief Complaint  Patient presents with  . Abdominal Pain     (Consider location/radiation/quality/duration/timing/severity/associated sxs/prior Treatment) Patient is a 65 y.o. male presenting with abdominal pain.  Abdominal Pain    Kristopher Gay is a 65 y.o M with a pmhx of COPD who presents to the emergency Department complaining of constipation and abdominal pain. Patient states that for the last week and a half he has been experiencing right lower quadrant abdominal pain and constipation. Pt has associated early satiety and bloating. Pt states that he has been eating less over the last week due to this. He has been seen by urgent care multiple times for this issue. He had a CT abdomen performed 2 days ago which revealed moderate stool retention throughout the colon. No sign of appendicitis, hernia, diverticulitis or other acute abdominal etiology. Small adrenal mass was noted on CT, he has an MRI scheduled to further evaluate this as an outpatient. Patient's been taking one cap full Of MiraLAX a day for the last week with no relief of his symptoms. Patient had a very small bowel movement yesterday. He is passing flatus. No nausea or vomiting. No fever. Patient had blood work performed at urgent care which was also normal.  Past Medical History  Diagnosis Date  . Unspecified asthma(493.90)   . Allergic rhinitis, cause unspecified   . Extrinsic asthma, unspecified   . DDD (degenerative disc disease)   . Neuromuscular disorder ([place])   . Anxiety   . Chronic headaches   . Depression   . COPD (chronic obstructive pulmonary disease) ([place])   . Blood clot in vein     left calf  . Allergy   . Emphysema of lung [facility])    Past Surgical History  Procedure Laterality Date  . Dvt and vein stripping left calf without pe  2002  . Rotator cuff repair Left 2005    detached; left  shoulder-reattached  . Shoulder surgery Right 2014    lateral tendon torn   Family History  Problem Relation Age of Onset  . Pancreatic cancer Mother   . Breast cancer Sister   . Epilepsy Brother    Social History  Substance Use Topics  . Smoking status: Never Smoker   . Smokeless tobacco: Never Used  . Alcohol Use: No    Review of Systems  Gastrointestinal: Positive for abdominal pain.  All other systems reviewed and are negative.     Allergies  Bee venom and Seasonal ic  Home Medications   Prior to Admission medications   Medication Sig Start Date End Date Taking? Authorizing Provider  albuterol (PROAIR HFA) 108 (90 Base) MCG/ACT inhaler Inhale 2 puffs into the lungs every 4 (four) hours as needed for wheezing or shortness of breath. 11/12/15 12/25/17 Yes [name], MD  Ascorbic Acid (VITAMIN C) 1000 MG tablet Take 1,000 mg by mouth 2 (two) times daily. Reported on 12/19/2015   Yes Historical Provider, MD  celecoxib (CELEBREX) 200 MG capsule Take 1 capsule (200 mg total) by mouth daily. 12/17/15  Yes Bayard Hugger, NP  Cholecalciferol (VITAMIN D-3) 5000 UNITS TABS Take 1 tablet by mouth daily. Reported on 12/19/2015   Yes Historical Provider, MD  DHEA 25 MG CAPS Take 1 capsule by mouth daily. Reported on 12/19/2015   Yes Historical Provider, MD  EPINEPHrine (EPIPEN 2-PAK)  0.3 mg/0.3 mL SOAJ injection Inject 0.3 mg into the muscle once. Reported on 12/13/2015   Yes Historical Provider, MD  etodolac (LODINE) 400 MG tablet Take 400 mg by mouth 2 (two) times daily. Reported on 12/19/2015   Yes Historical Provider, MD  Fluticasone-Salmeterol (ADVAIR DISKUS) 500-50 MCG/DOSE AEPB Inhale 1 puff, then rinse mouth, twice daily 11/12/15  Yes [name] Young, MD  gabapentin (NEURONTIN) 300 MG capsule TAKE 2 CAPSULES BY MOUTH EVERY MORNING, 1 IN THE AFTERNOON AND 2 EVERY NIGHT AT BEDTIME 12/17/15  Yes Bayard Hugger, NP  Glucosamine-Chondroit-Vit C-Mn (GLUCOSAMINE 1500 COMPLEX) CAPS Take 1  capsule by mouth daily. Reported on 12/19/2015   Yes Historical Provider, MD  HYDROcodone-acetaminophen (NORCO) 10-325 MG tablet Take 1 tablet by mouth every 6 (six) hours as needed. 12/17/15  Yes Bayard Hugger, NP  Multiple Vitamin (MULTIVITAMIN) tablet Take 1 tablet by mouth daily. Reported on 12/19/2015   Yes Historical Provider, MD  Omega-3 Fatty Acids (FISH OIL) 1000 MG CAPS Take 1 capsule by mouth daily. Reported on 12/19/2015   Yes Historical Provider, MD  vitamin E (VITAMIN E) 400 UNIT capsule Take 400 Units by mouth 2 (two) times daily. Reported on 12/19/2015   Yes Historical Provider, MD  pantoprazole (PROTONIX) 40 MG tablet Take 1 tablet 30 minutes prior to breakfast. Patient not taking: Reported on 12/19/2015 05/22/15   Janett Billow D Zehr, PA-C   BP 149/92 mmHg  Pulse 64  Temp(Src) 98.4 F (36.9 C) (Oral)  Resp 20  SpO2 98% Physical Exam  Constitutional: [name], place, and time. He appears well-developed and well-nourished. No distress.  HENT:  Head: Normocephalic and atraumatic.  Mouth/Throat: No oropharyngeal exudate.  Eyes: Conjunctivae and EOM are normal. Pupils are equal, round, and reactive to light. Right eye exhibits no discharge. Left eye exhibits no discharge. No scleral icterus.  Cardiovascular: Normal rate, regular rhythm, normal heart sounds and intact distal pulses.  Exam reveals no gallop and no friction rub.   No murmur heard. Pulmonary/Chest: Effort normal and breath sounds normal. No respiratory distress. He has no wheezes. He has no rales. He exhibits no tenderness.  Abdominal: Soft. Bowel sounds are normal. He exhibits no distension. There is tenderness ( mild RLQ TTP). There is no guarding.  Genitourinary: Rectum normal.  No fecal impaction on rectal exam.  Musculoskeletal: Normal range of motion. He exhibits no edema.  Neurological: He is alert and oriented to person, place, and time.  Skin: Skin is warm and dry. No rash noted. He is not  diaphoretic. No erythema. No pallor.  Psychiatric: [name] His behavior is normal.  Nursing note and vitals reviewed.   ED Course  Procedures (including critical care time) Labs Review Labs Reviewed - No data to display  Imaging Review No results found. I have personally reviewed and evaluated these images and lab results as part of my medical decision-making.   EKG Interpretation None      MDM   Final diagnoses:  Constipation, unspecified constipation type    65 y.o M presents to the ED c/o constipation over the last 2 weeks. Last BM was yesterday, but was very small. He is still passing flatus. Pt has been seen by Urgent care multiple times for same symptoms in the last 2 weeks. CT abd pelvis obtained 2 days ago which revealed the appendix and terminal ileum are unremarkable. Moderate amount of feces throughout the entire colon. No SBO or hernia.  Pt also had blood work performed at UC 2 days ago which I reviewed and is wnl. No electrolyte abnormalities. Hgb stable. No leukocytosis or renal impairment. Will not repeat today. No fecal impaction noted on rectal exam. Pt given FLEET enema in ED with very minimal stool output. Pt also given magnesium citrate and milk of molasses enema without relief. Discussed with pt that we can admit him to the hospital for high dose soap suds enemas. Pt declines admission and would like to try to continue drinking miralax and will try an additional bottle of magnesium citrate at home. Discussed with pt that if he does not have improvement in his symptoms in 2 days to return to the ED as pt is eating less due to his early satiety. Return precautions outlined in patient discharge instructions.   Patient was discussed with and seen by Dr. Regenia Skeeter who agrees with the treatment plan.      Dondra Spry [place], PA-C 12/23/15 2031  Sherwood Gambler, MD 12/24/15 [phone]

## 2015-12-22 NOTE — ED Notes (Signed)
PA sam made aware pt had BM. Mostly liquid with small amount of stool.

## 2015-12-27 ENCOUNTER — Telehealth: Payer: Self-pay

## 2015-12-27 ENCOUNTER — Ambulatory Visit: Payer: Medicare Other | Admitting: Gastroenterology

## 2015-12-27 ENCOUNTER — Ambulatory Visit (INDEPENDENT_AMBULATORY_CARE_PROVIDER_SITE_OTHER): Payer: Medicare Other

## 2015-12-27 DIAGNOSIS — J309 Allergic rhinitis, unspecified: Secondary | ICD-10-CM

## 2015-12-27 NOTE — Telephone Encounter (Signed)
Doc of the Day FYI Patient walks in with concerns about abdominal discomfort and bowel movements. Please see his recent visits and encounters since 12/13/15. He reports today he continues to not have formed bowel movements and "feels there is still something in there". He has maintained himself on a full liquid diet (juices, protein drinks and Ensure). He has liquid dark colored bowel movements but states "there is not very much coming out." He has not had any fevers, bloody bowel movements or vomiting. At first he declined an appointment, then he allowed to be scheduled 01/01/15. I encouraged him to try a soft diet and not to take any more Mag Citrate at this time. He will return to the ER if he acutely worsens.

## 2015-12-28 NOTE — Telephone Encounter (Signed)
Notes reviewed. He has had a normal CBC and CT scan abdomen showed significant stool burden. I do think he warrants a colonoscopy, as per notes I am not sure if he has ever had one and is overdue for his screening. Otherwise one of his physicians recommended a miralax bowel prep for his constipation. Has he tried this yet? If not would recommend it if he has not tried it yet although it sounds like he is producing stool on a daily basis. He will otherwise be seen on 3/7

## 2015-12-28 NOTE — Telephone Encounter (Signed)
Yes. I failed to document the use of Miralax. He has done the miralax purge as instructed by Dr Brigitte Pulse.

## 2016-01-01 ENCOUNTER — Encounter: Payer: Self-pay | Admitting: Gastroenterology

## 2016-01-01 ENCOUNTER — Ambulatory Visit (INDEPENDENT_AMBULATORY_CARE_PROVIDER_SITE_OTHER): Payer: Medicare Other | Admitting: Gastroenterology

## 2016-01-01 VITALS — BP 132/82 | HR 64 | Ht 74.0 in | Wt 219.0 lb

## 2016-01-01 DIAGNOSIS — G894 Chronic pain syndrome: Secondary | ICD-10-CM

## 2016-01-01 DIAGNOSIS — K5901 Slow transit constipation: Secondary | ICD-10-CM

## 2016-01-01 DIAGNOSIS — R1011 Right upper quadrant pain: Secondary | ICD-10-CM | POA: Diagnosis not present

## 2016-01-01 DIAGNOSIS — R1032 Left lower quadrant pain: Secondary | ICD-10-CM

## 2016-01-01 NOTE — Progress Notes (Signed)
[facility] Progress Note  Chief Complaint: Abdominal pain and constipation  Subjective History:  This is a sick visit for patient previously seen for dysphagia in both 2015 and 2016. 2015 and 2016 visits for dysphagia - declined EGD x 2. Prior barium swallow showed dysmotility and GERD  He is here now complaining of right sided and occasional left-sided abdominal pain. He is been to the urgent care twice this month in the ED on [address], all for the same symptoms. He had severe constipation and decided to stop his hydrocodone. He had crampy right-sided abdominal pain that is sometimes worse bending over. Records indicate that he was given multiple treatments for constipation which eventually worked after the ED visit. He said no rectal bleeding. Says his now somewhat afraid to eat, he still has some right upper quadrant or left upper quadrant pain usually with bending over. CT scan abdomen and pelvis was unremarkable except for an enlarged prostate and a possible left adrenal adenoma. He has never had a colonoscopy, and is reluctant to have one now because he lives alone and is not sure he can get a ride. ROS: Cardiovascular:  no chest pain Respiratory: no dyspnea  The patient's Past Medical, Family and Social History were reviewed and are on file in the EMR.  Objective:  Med list reviewed  Vital signs in last 24 hrs: Filed Vitals:   01/01/16 0828  BP: 132/82  Pulse: 64    Physical Exam Still wearing ED wrist band, because "I might need to go back". He is not acutely ill appearing.  Odd affect  HEENT: sclera anicteric, oral mucosa moist without lesions  Neck: supple, no thyromegaly, JVD or lymphadenopathy  Cardiac: RRR without murmurs, S1S2 heard, no peripheral edema  Pulm: clear to auscultation bilaterally, normal RR and effort noted  Abdomen: soft, minimal right upper quadrant tenderness, distractible, with active bowel sounds. No guarding or palpable  hepatosplenomegaly  Skin; warm and dry, no jaundice or rash  Recent Labs: Lab Results  Component Value Date   WBC 9.4 12/13/2015   HGB 15.3 12/13/2015   HCT 43.8 12/13/2015   MCV 91.6 12/13/2015     Radiologic studies:  See CT scan abdomen and pelvis report  @ASSESSMENTPLANBEGIN @ Assessment: Encounter Diagnoses  Name Primary?  . Slow transit constipation Yes  . RUQ pain   . LLQ abdominal pain   . Chronic pain syndrome    He appears to have persistent functional abdominal pain after recent narcotic-induced constipation. CT scan shows nothing structural obstructive inflammatory.  There is no red flag symptoms such as rectal bleeding or weight loss.   Plan: Increase MiraLAX to twice daily, and liberalize diet. He was offered a colonoscopy, he would like to think about it some more. Over half of the 25 minute encounter was spent in counseling and coordination of care.   Topics discussed: Causes of chronic constipation, difficult treatments, need for colonoscopy to rule out neoplasia (although that likelihood seems low in this case with a recent CT scan normal.)    Nelida Meuse III

## 2016-01-01 NOTE — Patient Instructions (Signed)
It has been recommended to you by your physician that you have a(n) colonoscopy completed. Per your request, we did not schedule the procedure(s) today. Please contact our office at [phone] should you decide to have the procedure completed.   Thank you for choosing [facility]  Dr Wilfrid Lund III

## 2016-01-03 ENCOUNTER — Ambulatory Visit (INDEPENDENT_AMBULATORY_CARE_PROVIDER_SITE_OTHER): Payer: Medicare Other | Admitting: *Deleted

## 2016-01-03 DIAGNOSIS — J309 Allergic rhinitis, unspecified: Secondary | ICD-10-CM | POA: Diagnosis not present

## 2016-01-10 ENCOUNTER — Ambulatory Visit (INDEPENDENT_AMBULATORY_CARE_PROVIDER_SITE_OTHER): Payer: Medicare Other | Admitting: *Deleted

## 2016-01-10 DIAGNOSIS — J309 Allergic rhinitis, unspecified: Secondary | ICD-10-CM | POA: Diagnosis not present

## 2016-01-11 ENCOUNTER — Ambulatory Visit
Admission: RE | Admit: 2016-01-11 | Discharge: 2016-01-11 | Disposition: A | Discharge status: Home / Self Care | Payer: Medicare Other | Source: Ambulatory Visit | Attending: Urgent Care | Admitting: Urgent Care

## 2016-01-11 DIAGNOSIS — R935 Abnormal findings on diagnostic imaging of other abdominal regions, including retroperitoneum: Secondary | ICD-10-CM

## 2016-01-14 ENCOUNTER — Encounter: Payer: Medicare Other | Attending: Physical Medicine & Rehabilitation | Admitting: Registered Nurse

## 2016-01-14 ENCOUNTER — Encounter: Payer: Self-pay | Admitting: Registered Nurse

## 2016-01-14 VITALS — BP 139/74 | HR 63

## 2016-01-14 DIAGNOSIS — M609 Myositis, unspecified: Secondary | ICD-10-CM

## 2016-01-14 DIAGNOSIS — Z79899 Other long term (current) drug therapy: Secondary | ICD-10-CM | POA: Insufficient documentation

## 2016-01-14 DIAGNOSIS — S8410XS Injury of peroneal nerve at lower leg level, unspecified leg, sequela: Secondary | ICD-10-CM

## 2016-01-14 DIAGNOSIS — Z5181 Encounter for therapeutic drug level monitoring: Secondary | ICD-10-CM | POA: Insufficient documentation

## 2016-01-14 DIAGNOSIS — IMO0001 Reserved for inherently not codable concepts without codable children: Secondary | ICD-10-CM

## 2016-01-14 DIAGNOSIS — G894 Chronic pain syndrome: Secondary | ICD-10-CM | POA: Diagnosis not present

## 2016-01-14 DIAGNOSIS — M47817 Spondylosis without myelopathy or radiculopathy, lumbosacral region: Secondary | ICD-10-CM

## 2016-01-14 DIAGNOSIS — M791 Myalgia: Secondary | ICD-10-CM | POA: Diagnosis not present

## 2016-01-14 MED ORDER — HYDROCODONE-ACETAMINOPHEN 10-325 MG PO TABS: 1.0000 | ORAL_TABLET | Freq: Four times a day (QID) | ORAL | Status: DC | PRN
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
## 2016-01-17 ENCOUNTER — Ambulatory Visit (INDEPENDENT_AMBULATORY_CARE_PROVIDER_SITE_OTHER): Payer: Medicare Other | Admitting: *Deleted

## 2016-01-17 DIAGNOSIS — J309 Allergic rhinitis, unspecified: Secondary | ICD-10-CM | POA: Diagnosis not present

## 2016-01-21 ENCOUNTER — Telehealth: Payer: Self-pay

## 2016-01-21 ENCOUNTER — Telehealth: Payer: Self-pay | Admitting: Physical Medicine & Rehabilitation

## 2016-01-21 NOTE — Telephone Encounter (Signed)
° °  Patient is requesting his latest MRI results. He is afraid it is cancer and wants to know the results now.   [facility]   Call patient [phone]

## 2016-01-21 NOTE — Telephone Encounter (Signed)
Patient is needing a new prescription to get his Tens unit supplies, they have run out and needs the order sent to [facility].  Any questions please call patient at [phone].

## 2016-01-21 NOTE — Telephone Encounter (Signed)
Kristopher Gay,   I see you ordered CT 03/17, not sure if he meant CT? See CT report and please advise. Thanks

## 2016-01-22 NOTE — Telephone Encounter (Signed)
Jason faxed RX and called patient to let him know it had been done.

## 2016-01-23 ENCOUNTER — Other Ambulatory Visit: Payer: Self-pay | Admitting: Urgent Care

## 2016-01-23 DIAGNOSIS — D3502 Benign neoplasm of left adrenal gland: Secondary | ICD-10-CM

## 2016-01-23 NOTE — Telephone Encounter (Signed)
Reviewed results with patient. Referral to endocrinology pending. Patient declined to pursue further work up of his prostate.

## 2016-01-24 ENCOUNTER — Ambulatory Visit (INDEPENDENT_AMBULATORY_CARE_PROVIDER_SITE_OTHER): Payer: Medicare Other | Admitting: *Deleted

## 2016-01-24 DIAGNOSIS — J309 Allergic rhinitis, unspecified: Secondary | ICD-10-CM | POA: Diagnosis not present

## 2016-01-31 ENCOUNTER — Ambulatory Visit: Payer: Medicare Other

## 2016-01-31 ENCOUNTER — Ambulatory Visit (INDEPENDENT_AMBULATORY_CARE_PROVIDER_SITE_OTHER): Payer: Medicare Other | Admitting: *Deleted

## 2016-01-31 DIAGNOSIS — J309 Allergic rhinitis, unspecified: Secondary | ICD-10-CM

## 2016-02-07 ENCOUNTER — Ambulatory Visit (INDEPENDENT_AMBULATORY_CARE_PROVIDER_SITE_OTHER): Payer: Medicare Other | Admitting: *Deleted

## 2016-02-07 DIAGNOSIS — J309 Allergic rhinitis, unspecified: Secondary | ICD-10-CM

## 2016-02-12 ENCOUNTER — Telehealth: Payer: Self-pay | Admitting: Physical Medicine & Rehabilitation

## 2016-02-12 NOTE — Telephone Encounter (Signed)
Patient called again about prescription for his Tens unit supplies--refaxed over the script and added patient's diagnosis codes to it (fax number [phone])

## 2016-02-14 ENCOUNTER — Telehealth: Payer: Self-pay | Admitting: *Deleted

## 2016-02-14 ENCOUNTER — Ambulatory Visit (INDEPENDENT_AMBULATORY_CARE_PROVIDER_SITE_OTHER): Payer: Medicare Other | Admitting: *Deleted

## 2016-02-14 DIAGNOSIS — J309 Allergic rhinitis, unspecified: Secondary | ICD-10-CM | POA: Diagnosis not present

## 2016-02-14 NOTE — Telephone Encounter (Signed)
Allergy Serum Extract Date Mixed: 02/14/16 Vial: 1 Strength: 1:10 Here/Mail/Pick Up: here Mixed By: tbs Last OV: 11/12/15 Pending OV: 05/12/16

## 2016-02-18 ENCOUNTER — Encounter: Payer: Self-pay | Admitting: Registered Nurse

## 2016-02-18 ENCOUNTER — Encounter: Payer: Medicare Other | Attending: Physical Medicine & Rehabilitation | Admitting: Registered Nurse

## 2016-02-18 VITALS — BP 151/70 | HR 66

## 2016-02-18 DIAGNOSIS — M47817 Spondylosis without myelopathy or radiculopathy, lumbosacral region: Secondary | ICD-10-CM

## 2016-02-18 DIAGNOSIS — M791 Myalgia: Secondary | ICD-10-CM

## 2016-02-18 DIAGNOSIS — Z5181 Encounter for therapeutic drug level monitoring: Secondary | ICD-10-CM | POA: Insufficient documentation

## 2016-02-18 DIAGNOSIS — Z79899 Other long term (current) drug therapy: Secondary | ICD-10-CM | POA: Insufficient documentation

## 2016-02-18 DIAGNOSIS — M609 Myositis, unspecified: Secondary | ICD-10-CM

## 2016-02-18 DIAGNOSIS — S8410XS Injury of peroneal nerve at lower leg level, unspecified leg, sequela: Secondary | ICD-10-CM | POA: Diagnosis not present

## 2016-02-18 DIAGNOSIS — G894 Chronic pain syndrome: Secondary | ICD-10-CM | POA: Diagnosis not present

## 2016-02-18 DIAGNOSIS — IMO0001 Reserved for inherently not codable concepts without codable children: Secondary | ICD-10-CM

## 2016-02-18 MED ORDER — HYDROCODONE-ACETAMINOPHEN 10-325 MG PO TABS: 1.0000 | ORAL_TABLET | Freq: Four times a day (QID) | ORAL | Status: DC | PRN
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
## 2016-02-20 ENCOUNTER — Encounter: Payer: Self-pay | Admitting: Endocrinology

## 2016-02-20 ENCOUNTER — Ambulatory Visit (INDEPENDENT_AMBULATORY_CARE_PROVIDER_SITE_OTHER): Payer: Medicare Other | Admitting: Endocrinology

## 2016-02-20 VITALS — BP 130/68 | HR 58 | Temp 98.4°F | Resp 14 | Ht 74.0 in | Wt 215.2 lb

## 2016-02-20 DIAGNOSIS — D3500 Benign neoplasm of unspecified adrenal gland: Secondary | ICD-10-CM | POA: Insufficient documentation

## 2016-02-20 DIAGNOSIS — D3502 Benign neoplasm of left adrenal gland: Secondary | ICD-10-CM | POA: Diagnosis not present

## 2016-02-20 MED ORDER — DEXAMETHASONE 1 MG PO TABS: ORAL_TABLET | ORAL | Status: DC
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
## 2016-02-20 NOTE — Patient Instructions (Signed)
blood tests are requested for you today.  We'll let you know about the results. you should do a "dexamethasone suppression test."  for this, you would take dexamethasone 1 mg at 10 pm, then come in for a "cortisol" blood test the next morning before 9 am.  you do not need to be fasting for this test. Let's also check a 24-HR urine test, and: Recheck the CT in a few months.  If these tests are fine, no further testing is needed.

## 2016-02-21 ENCOUNTER — Ambulatory Visit (INDEPENDENT_AMBULATORY_CARE_PROVIDER_SITE_OTHER): Payer: Medicare Other | Admitting: *Deleted

## 2016-02-21 DIAGNOSIS — J309 Allergic rhinitis, unspecified: Secondary | ICD-10-CM | POA: Diagnosis not present

## 2016-02-28 ENCOUNTER — Ambulatory Visit (INDEPENDENT_AMBULATORY_CARE_PROVIDER_SITE_OTHER): Payer: Medicare Other

## 2016-02-28 DIAGNOSIS — J309 Allergic rhinitis, unspecified: Secondary | ICD-10-CM | POA: Diagnosis not present

## 2016-02-29 LAB — ALDOSTERONE + RENIN ACTIVITY W/ RATIO
ALDO / PRA Ratio: 2.2 Ratio (ref 0.9–28.9)
Aldosterone: 3 ng/dL
PRA LC/MS/MS: 1.39 ng/mL/h (ref 0.25–5.82)

## 2016-03-06 ENCOUNTER — Ambulatory Visit (INDEPENDENT_AMBULATORY_CARE_PROVIDER_SITE_OTHER): Payer: Medicare Other | Admitting: *Deleted

## 2016-03-06 DIAGNOSIS — J309 Allergic rhinitis, unspecified: Secondary | ICD-10-CM

## 2016-03-07 ENCOUNTER — Other Ambulatory Visit (INDEPENDENT_AMBULATORY_CARE_PROVIDER_SITE_OTHER): Payer: Medicare Other

## 2016-03-07 DIAGNOSIS — D3502 Benign neoplasm of left adrenal gland: Secondary | ICD-10-CM | POA: Diagnosis not present

## 2016-03-07 LAB — CORTISOL: Cortisol, Plasma: 1.3 ug/dL

## 2016-03-11 LAB — CATECHOLAMINES, FRACTIONATED, URINE, 24 HOUR
Calculated Total (E+NE): 78 mcg/24 h (ref 26–121)
Creatinine, Urine mg/day-CATEUR: 1.65 g/(24.h) (ref 0.63–2.50)
Dopamine, 24 hr Urine: 212 mcg/24 h (ref 52–480)
Norepinephrine, 24 hr Ur: 78 mcg/24 h (ref 15–100)
Total Volume - CF 24Hr U: 2400 mL

## 2016-03-11 LAB — METANEPHRINES, URINE, 24 HOUR
Metaneph Total, Ur: 232 mcg/24 h (ref 224–832)
Metanephrines, Ur: 70 mcg/24 h — ABNORMAL LOW (ref 90–315)
Normetanephrine, 24H Ur: 162 mcg/24 h (ref 122–676)

## 2016-03-13 ENCOUNTER — Ambulatory Visit (INDEPENDENT_AMBULATORY_CARE_PROVIDER_SITE_OTHER): Payer: Medicare Other | Admitting: *Deleted

## 2016-03-13 ENCOUNTER — Encounter: Payer: Self-pay | Admitting: Internal Medicine

## 2016-03-13 DIAGNOSIS — J309 Allergic rhinitis, unspecified: Secondary | ICD-10-CM | POA: Diagnosis not present

## 2016-03-14 ENCOUNTER — Encounter: Payer: Self-pay | Admitting: Registered Nurse

## 2016-03-14 ENCOUNTER — Encounter: Payer: Medicare Other | Attending: Physical Medicine & Rehabilitation | Admitting: Registered Nurse

## 2016-03-14 VITALS — BP 113/66 | HR 62 | Resp 15

## 2016-03-14 DIAGNOSIS — Z5181 Encounter for therapeutic drug level monitoring: Secondary | ICD-10-CM | POA: Diagnosis present

## 2016-03-14 DIAGNOSIS — Z79899 Other long term (current) drug therapy: Secondary | ICD-10-CM | POA: Diagnosis not present

## 2016-03-14 DIAGNOSIS — M25561 Pain in right knee: Secondary | ICD-10-CM

## 2016-03-14 DIAGNOSIS — S8410XS Injury of peroneal nerve at lower leg level, unspecified leg, sequela: Secondary | ICD-10-CM

## 2016-03-14 DIAGNOSIS — M791 Myalgia: Secondary | ICD-10-CM

## 2016-03-14 DIAGNOSIS — M609 Myositis, unspecified: Secondary | ICD-10-CM

## 2016-03-14 DIAGNOSIS — M25562 Pain in left knee: Secondary | ICD-10-CM

## 2016-03-14 DIAGNOSIS — G894 Chronic pain syndrome: Secondary | ICD-10-CM

## 2016-03-14 DIAGNOSIS — IMO0001 Reserved for inherently not codable concepts without codable children: Secondary | ICD-10-CM

## 2016-03-14 DIAGNOSIS — M47817 Spondylosis without myelopathy or radiculopathy, lumbosacral region: Secondary | ICD-10-CM

## 2016-03-14 MED ORDER — HYDROCODONE-ACETAMINOPHEN 10-325 MG PO TABS: 1.0000 | ORAL_TABLET | Freq: Four times a day (QID) | ORAL | Status: DC | PRN

## 2016-03-14 MED ORDER — GABAPENTIN 300 MG PO CAPS: ORAL_CAPSULE | ORAL | Status: DC

## 2016-03-14 MED ORDER — CELECOXIB 200 MG PO CAPS: 200.0000 mg | ORAL_CAPSULE | Freq: Every day | ORAL | Status: DC
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
## 2016-03-20 ENCOUNTER — Ambulatory Visit (INDEPENDENT_AMBULATORY_CARE_PROVIDER_SITE_OTHER): Payer: Medicare Other | Admitting: *Deleted

## 2016-03-20 DIAGNOSIS — J309 Allergic rhinitis, unspecified: Secondary | ICD-10-CM

## 2016-03-22 LAB — TOXASSURE SELECT,+ANTIDEPR,UR

## 2016-03-27 ENCOUNTER — Ambulatory Visit (INDEPENDENT_AMBULATORY_CARE_PROVIDER_SITE_OTHER): Payer: Medicare Other | Admitting: *Deleted

## 2016-03-27 DIAGNOSIS — J309 Allergic rhinitis, unspecified: Secondary | ICD-10-CM

## 2016-03-27 NOTE — Progress Notes (Signed)
Urine drug screen for this encounter is consistent for prescribed medication 

## 2016-04-03 ENCOUNTER — Ambulatory Visit (INDEPENDENT_AMBULATORY_CARE_PROVIDER_SITE_OTHER): Payer: Medicare Other | Admitting: *Deleted

## 2016-04-03 DIAGNOSIS — J309 Allergic rhinitis, unspecified: Secondary | ICD-10-CM

## 2016-04-10 ENCOUNTER — Ambulatory Visit (INDEPENDENT_AMBULATORY_CARE_PROVIDER_SITE_OTHER): Payer: Medicare Other | Admitting: *Deleted

## 2016-04-10 DIAGNOSIS — J309 Allergic rhinitis, unspecified: Secondary | ICD-10-CM

## 2016-04-14 ENCOUNTER — Ambulatory Visit: Payer: Medicare Other | Admitting: Physical Medicine & Rehabilitation

## 2016-04-15 ENCOUNTER — Encounter: Payer: Self-pay | Admitting: Registered Nurse

## 2016-04-15 ENCOUNTER — Encounter: Payer: Medicare Other | Attending: Physical Medicine & Rehabilitation | Admitting: Registered Nurse

## 2016-04-15 VITALS — BP 144/78 | HR 64

## 2016-04-15 DIAGNOSIS — Z79899 Other long term (current) drug therapy: Secondary | ICD-10-CM | POA: Insufficient documentation

## 2016-04-15 DIAGNOSIS — M25561 Pain in right knee: Secondary | ICD-10-CM

## 2016-04-15 DIAGNOSIS — M47817 Spondylosis without myelopathy or radiculopathy, lumbosacral region: Secondary | ICD-10-CM

## 2016-04-15 DIAGNOSIS — M25562 Pain in left knee: Secondary | ICD-10-CM

## 2016-04-15 DIAGNOSIS — G894 Chronic pain syndrome: Secondary | ICD-10-CM | POA: Diagnosis not present

## 2016-04-15 DIAGNOSIS — Z5181 Encounter for therapeutic drug level monitoring: Secondary | ICD-10-CM | POA: Insufficient documentation

## 2016-04-15 DIAGNOSIS — S8410XS Injury of peroneal nerve at lower leg level, unspecified leg, sequela: Secondary | ICD-10-CM | POA: Diagnosis not present

## 2016-04-15 DIAGNOSIS — M609 Myositis, unspecified: Secondary | ICD-10-CM

## 2016-04-15 DIAGNOSIS — IMO0001 Reserved for inherently not codable concepts without codable children: Secondary | ICD-10-CM

## 2016-04-15 DIAGNOSIS — M791 Myalgia: Secondary | ICD-10-CM

## 2016-04-15 MED ORDER — HYDROCODONE-ACETAMINOPHEN 10-325 MG PO TABS: 1.0000 | ORAL_TABLET | Freq: Four times a day (QID) | ORAL | Status: DC | PRN

## 2016-04-17 ENCOUNTER — Encounter: Payer: Self-pay | Admitting: Registered Nurse

## 2016-04-17 ENCOUNTER — Ambulatory Visit (INDEPENDENT_AMBULATORY_CARE_PROVIDER_SITE_OTHER): Payer: Medicare Other | Admitting: *Deleted

## 2016-04-17 DIAGNOSIS — J309 Allergic rhinitis, unspecified: Secondary | ICD-10-CM
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
## 2016-04-25 ENCOUNTER — Ambulatory Visit (INDEPENDENT_AMBULATORY_CARE_PROVIDER_SITE_OTHER): Payer: Medicare Other | Admitting: *Deleted

## 2016-04-25 DIAGNOSIS — J309 Allergic rhinitis, unspecified: Secondary | ICD-10-CM | POA: Diagnosis not present

## 2016-05-02 ENCOUNTER — Ambulatory Visit (INDEPENDENT_AMBULATORY_CARE_PROVIDER_SITE_OTHER): Payer: Medicare Other | Admitting: *Deleted

## 2016-05-02 DIAGNOSIS — J309 Allergic rhinitis, unspecified: Secondary | ICD-10-CM | POA: Diagnosis not present

## 2016-05-09 ENCOUNTER — Ambulatory Visit (INDEPENDENT_AMBULATORY_CARE_PROVIDER_SITE_OTHER): Payer: Medicare Other | Admitting: *Deleted

## 2016-05-09 DIAGNOSIS — J309 Allergic rhinitis, unspecified: Secondary | ICD-10-CM | POA: Diagnosis not present

## 2016-05-12 ENCOUNTER — Ambulatory Visit: Payer: Medicare Other | Admitting: Internal Medicine

## 2016-05-12 ENCOUNTER — Ambulatory Visit (INDEPENDENT_AMBULATORY_CARE_PROVIDER_SITE_OTHER): Payer: Medicare Other | Admitting: Internal Medicine

## 2016-05-12 ENCOUNTER — Encounter: Payer: Self-pay | Admitting: Internal Medicine

## 2016-05-12 VITALS — BP 120/80 | HR 65 | Ht 74.0 in | Wt 235.2 lb

## 2016-05-12 DIAGNOSIS — K219 Gastro-esophageal reflux disease without esophagitis: Secondary | ICD-10-CM | POA: Diagnosis not present

## 2016-05-12 DIAGNOSIS — J3089 Other allergic rhinitis: Secondary | ICD-10-CM

## 2016-05-12 DIAGNOSIS — J309 Allergic rhinitis, unspecified: Secondary | ICD-10-CM

## 2016-05-12 DIAGNOSIS — J302 Other seasonal allergic rhinitis: Secondary | ICD-10-CM

## 2016-05-12 DIAGNOSIS — R918 Other nonspecific abnormal finding of lung field: Secondary | ICD-10-CM

## 2016-05-12 DIAGNOSIS — J449 Chronic obstructive pulmonary disease, unspecified: Secondary | ICD-10-CM | POA: Diagnosis not present

## 2016-05-12 DIAGNOSIS — J45909 Unspecified asthma, uncomplicated: Secondary | ICD-10-CM

## 2016-05-12 MED ORDER — AMOXICILLIN-POT CLAVULANATE 875-125 MG PO TABS: 1.0000 | ORAL_TABLET | Freq: Two times a day (BID) | ORAL | Status: DC

## 2016-05-12 MED ORDER — FLUTICASONE PROPIONATE 50 MCG/ACT NA SUSP: 1.0000 | Freq: Two times a day (BID) | NASAL | Status: DC | PRN

## 2016-05-12 NOTE — Progress Notes (Signed)
Patient ID: Kristopher Gay, male    DOB: 1951/06/20, 65 y.o.   MRN: [id number]  HPI  07/31/14-63 yo never smoker followed for allergic rhinitis and allergic asthma/ bronchitis, complicated by chronic musculoskeletal pain after an injury, GERD. FOLLOW FOR: coughing up yellow mucus, chest pain/tightness - had barium swallow done in June 2015, was diagnosed with GERD, still having trouble with the chest tightness despite taking Omeprazole. Substernal discomfort, sometimes feels like reflux, present much of the time but worse with overexertion and bending. Omeprazole was insufficient help. Barium Swallow 04/04/14 IMPRESSION:  1. No esophageal stricture or upper esophageal web. Mild dysmotility  distal esophagus with tertiary contractions. No hiatal hernia.  2. Moderate gastroesophageal reflux to the level of mid esophagus.  Electronically Signed  By: Lahoma Crocker M.D.  On: 04/04/2014 14:51   12/04/14- 31 yo never smoker followed for allergic rhinitis and allergic asthma/ bronchitis, complicated by chronic musculoskeletal pain after an injury, GERD. FOLLOWS FOR: pt did see gastro and was prescribed Protonix 40 mg. He has seen a difference. Pt c/o cough with grayish yellow mucus, wheezing and chest tightness. Pt has had some sob. He never had endoscopy recommended by Dr Deatra Ina, but is using the Protonix. No change so far in chronic cough. Continues allergy vaccine 1:10 GH. Frequent rhinorrhea triggered especially by cold air.  11/12/2015-65 year old male never smoker followed for allergic rhinitis, allergic asthma/bronchitis, complicated by chronic musculoskeletal pain after an injury, GERD/confirmed reflux, grade 1 diastolic dysfunction Allergy vaccine 1:10 GH FOLLOWS FOR: Still on allergy vaccine; PND,itchy, watery eyes. Coughing-productive-gray in color-early in the morning time. Recent nasal congestion without obvious respiratory infection. Using a chainsaw yesterday and detailed a lot of dust. Is  using his Advair regularly and pro air 2 or 3 times daily for this. CXR 08/21/2015 IMPRESSION: No edema or consolidation. Slight bibasilar atelectatic change. Electronically Signed  By: Lowella Grip III M.D.  On: 08/21/2015 11:30  05/12/2016-65 year old male never smoker followed for Allergic rhinitis, allergic asthma/bronchitis, complicated by chronic musculoskeletal pain after an injury, GERD/confirmed reflux, grade 1 diastolic dysfunction, degenerative disc disease Allergy vaccine 1:10 GH FOLLOW FOR: allergies are not doing well.  he wants to go over CT Abdomen.  Showed thickness in lungs. CT abdomen 01/11/2016-IMPRESSION: 1. 2.2 cm left adrenal adenoma. 2. New bronchial wall thickening and nodular opacities in the right lower lobe likely reflecting bronchiolitis. Nasal congestion-needs refill steroid nasal spray. Chest congestion with persistent cough, thick clear mucus. 3 months. Worse with the exertion of mowing grass. Denies fever, sweat, blood or purulent sputum. We reviewed CT abdomen done for abdominal pain and incidentally showing lower zone bronchial thickening and small nodules. This pattern may been present on most recent chest x-rays.  ROS-see HPI Constitutional:   No-   weight loss, night sweats, fevers, chills, fatigue, lassitude. HEENT:   No-  headaches, difficulty swallowing, tooth/dental problems, +sore throat,       +sneezing, itching, ear ache, + nasal congestion, +post nasal drip,  CV:  +chest pain, no-orthopnea, PND, swelling in lower extremities, anasarca,  dizziness, palpitations Resp: No-   shortness of breath with exertion or at rest.             + productive cough,  + non-productive cough,  No- coughing up of blood.              No-   change in color of mucus.  +wheezing.   Skin: No-   rash or lesions. GI:  No-   heartburn,  indigestion, abdominal pain, nausea, vomiting,  GU: . MS: +  joint pain or swelling.  No- decreased range of motion.  + back  pain. Neuro-     nothing unusual Psych:  No- change in mood or affect. + depression or anxiety.  No memory loss.  OBJ- Physical Exam General- Alert, Oriented, Affect-appropriate, Distress- none acute. Big/ muscular Skin- rash-none, lesions- none, excoriation- none Lymphadenopathy- none Head- atraumatic            Eyes- Gross vision intact, PERRLA, conjunctivae and secretions clear            Ears- Hearing, canals-normal            Nose- Clear, no-Septal dev, mucus, polyps, erosion, perforation             Throat- Mallampati II , mucosa clear(throat lozenges) , drainage- none, tonsils- atrophic Neck- flexible , trachea midline, no stridor , thyroid nl, carotid no bruit Chest - symmetrical excursion , unlabored           Heart/CV- RRR , no murmur , no gallop  , no rub, nl s1 s2                           - JVD- none , edema- none, stasis changes- none, varices- none           Lung- + bibasilar rhonchi and squeaks with little wheeze , dullness-none, rub- none           Chest wall- not obviously tender Abd-  Br/ Gen/ Rectal- Not done, not indicated Extrem- cyanosis- none, clubbing, none, atrophy- none, strength- muscular. + Brace L lower leg, + cane Neuro- grossly intact to observation

## 2016-05-12 NOTE — Patient Instructions (Addendum)
Script sent - augmentin antibiotic for bronchitis  Order- CT chest no contrast- add to CT abd and pelvis already scheduled at [facility] for July 26 at 1030 AM   Dx lung nodules

## 2016-05-12 NOTE — Assessment & Plan Note (Signed)
CT abdomen 01/11/2016 indicated lower lung zone bronchial thickening and micronodules. Strongly suspect recurrent aspiration with chronic bronchitis and probably some bronchiectasis. Plan-add CT chest to pending CT abdomen

## 2016-05-12 NOTE — Assessment & Plan Note (Signed)
Okay to continue allergy vaccine for now while our allergy program is running. Plan-refill Flonase

## 2016-05-12 NOTE — Assessment & Plan Note (Signed)
He doesn't feel reflux events, but previously demonstrated and probably associated with occasional aspiration. Plan-emphasis on reflux precautions

## 2016-05-15 ENCOUNTER — Encounter: Payer: Self-pay | Admitting: Physical Medicine & Rehabilitation

## 2016-05-15 ENCOUNTER — Encounter: Payer: Medicare Other | Attending: Physical Medicine & Rehabilitation

## 2016-05-15 ENCOUNTER — Ambulatory Visit (INDEPENDENT_AMBULATORY_CARE_PROVIDER_SITE_OTHER): Payer: Medicare Other

## 2016-05-15 ENCOUNTER — Ambulatory Visit (HOSPITAL_BASED_OUTPATIENT_CLINIC_OR_DEPARTMENT_OTHER): Payer: Medicare Other | Admitting: Physical Medicine & Rehabilitation

## 2016-05-15 VITALS — BP 121/75 | HR 60 | Resp 14

## 2016-05-15 DIAGNOSIS — G8929 Other chronic pain: Secondary | ICD-10-CM

## 2016-05-15 DIAGNOSIS — M47817 Spondylosis without myelopathy or radiculopathy, lumbosacral region: Secondary | ICD-10-CM | POA: Diagnosis not present

## 2016-05-15 DIAGNOSIS — G894 Chronic pain syndrome: Secondary | ICD-10-CM

## 2016-05-15 DIAGNOSIS — M25561 Pain in right knee: Secondary | ICD-10-CM

## 2016-05-15 DIAGNOSIS — J309 Allergic rhinitis, unspecified: Secondary | ICD-10-CM

## 2016-05-15 DIAGNOSIS — M25511 Pain in right shoulder: Secondary | ICD-10-CM

## 2016-05-15 DIAGNOSIS — Z5181 Encounter for therapeutic drug level monitoring: Secondary | ICD-10-CM | POA: Insufficient documentation

## 2016-05-15 DIAGNOSIS — Z79899 Other long term (current) drug therapy: Secondary | ICD-10-CM | POA: Diagnosis present

## 2016-05-15 MED ORDER — HYDROCODONE-ACETAMINOPHEN 10-325 MG PO TABS: 1.0000 | ORAL_TABLET | Freq: Four times a day (QID) | ORAL | Status: DC | PRN
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
## 2016-05-15 NOTE — Patient Instructions (Signed)
Hylan G-F 20 intra-articular injection What is this medicine? HYLAN G-F 20 (HI lan G F 20) is used to treat osteoarthritis of the knee. It lubricates and cushions the joint, reducing pain in the knee. This medicine may be used for other purposes; ask your health care provider or pharmacist if you have questions. What should I tell my health care provider before I take this medicine? They need to know if you have any of these conditions: -severe knee inflammation -skin conditions or sensitivity -skin or joint infection -venous stasis -an unusual or allergic reaction to hylan G-F 20, hyaluronan (sodium hyaluronate), eggs, other medicines, foods, dyes, or preservatives -pregnant or trying to get pregnant -breast-feeding How should I use this medicine? This medicine is for injection into the knee joint. It is given by a health care professional in a hospital or clinic setting. Talk to your pediatrician regarding the use of this medicine in children. This medicine is not approved for use in children. Overdosage: If you think you have taken too much of this medicine contact a poison control center or emergency room at once. NOTE: This medicine is only for you. Do not share this medicine with others. What if I miss a dose? Keep appointments for follow-up doses as directed. For Synvisc, you will need weekly injections for 3 doses. It is important not to miss your dose. If you will receive Synvisc-One, then only 1 injection will be needed. Call your doctor or health care professional if you are unable to keep an appointment. What may interact with this medicine? Do not take this medicine with any of the following medications: -other injections for the joint like steroids or anesthetics -certain skin disinfectants like benzalkonium chloride This list may not describe all possible interactions. Give your health care provider a list of all the medicines, herbs, non-prescription drugs, or dietary  supplements you use. Also tell them if you smoke, drink alcohol, or use illegal drugs. Some items may interact with your medicine. What should I watch for while using this medicine? Tell your doctor or healthcare professional if your symptoms do not start to get better or if they get worse. Your condition will be monitored carefully while you are receiving this medicine. Most persons get pain relief for up to 6 months after treatment. Avoid strenuous activities (high-impact sports, jogging) or major weight-bearing activities for 48 hours after the injection. What side effects may I notice from receiving this medicine? Side effects that you should report to your doctor or health care professional as soon as possible: -allergic reactions like skin rash, itching or hives, swelling of the face, lips, or tongue -difficulty breathing -fever or chills -severe joint pain or swelling -unusual bleeding or bruising Side effects that usually do not require medical attention (Report these to your doctor or health care professional if they continue or are bothersome.): -dizziness -flushing -general ill feeling or flu-like symptoms -headache -minor joint pain or swelling -muscle pain or cramps -pain, redness, irritation or bruising at site of injection This list may not describe all possible side effects. Call your doctor for medical advice about side effects. You may report side effects to FDA at 1-800-FDA-1088. Where should I keep my medicine? This drug is given in a hospital or clinic and will not be stored at home. NOTE: This sheet is a summary. It may not cover all possible information. If you have questions about this medicine, talk to your doctor, pharmacist, or health care provider.    2016, Elsevier/Gold  Standard. (2010-05-30 16:39:59)

## 2016-05-16 ENCOUNTER — Ambulatory Visit: Payer: Medicare Other

## 2016-05-21 ENCOUNTER — Ambulatory Visit (INDEPENDENT_AMBULATORY_CARE_PROVIDER_SITE_OTHER)
Admission: RE | Admit: 2016-05-21 | Discharge: 2016-05-21 | Disposition: A | Discharge status: Home / Self Care | Payer: Medicare Other | Source: Ambulatory Visit | Attending: Internal Medicine | Admitting: Internal Medicine

## 2016-05-21 ENCOUNTER — Ambulatory Visit (INDEPENDENT_AMBULATORY_CARE_PROVIDER_SITE_OTHER)
Admission: RE | Admit: 2016-05-21 | Discharge: 2016-05-21 | Disposition: A | Discharge status: Home / Self Care | Payer: Medicare Other | Source: Ambulatory Visit | Attending: Endocrinology | Admitting: Endocrinology

## 2016-05-21 DIAGNOSIS — R918 Other nonspecific abnormal finding of lung field: Secondary | ICD-10-CM | POA: Diagnosis not present

## 2016-05-21 DIAGNOSIS — D3502 Benign neoplasm of left adrenal gland: Secondary | ICD-10-CM | POA: Diagnosis not present

## 2016-05-22 ENCOUNTER — Ambulatory Visit (INDEPENDENT_AMBULATORY_CARE_PROVIDER_SITE_OTHER): Payer: Medicare Other | Admitting: *Deleted

## 2016-05-22 DIAGNOSIS — J309 Allergic rhinitis, unspecified: Secondary | ICD-10-CM

## 2016-05-29 ENCOUNTER — Ambulatory Visit (INDEPENDENT_AMBULATORY_CARE_PROVIDER_SITE_OTHER): Payer: Medicare Other | Admitting: *Deleted

## 2016-05-29 ENCOUNTER — Telehealth: Payer: Self-pay | Admitting: Internal Medicine

## 2016-05-29 DIAGNOSIS — J309 Allergic rhinitis, unspecified: Secondary | ICD-10-CM

## 2016-05-29 DIAGNOSIS — R918 Other nonspecific abnormal finding of lung field: Secondary | ICD-10-CM

## 2016-05-29 MED ORDER — AMOXICILLIN-POT CLAVULANATE 875-125 MG PO TABS: 1.0000 | ORAL_TABLET | Freq: Two times a day (BID) | ORAL | 0 refills | Status: DC

## 2016-05-29 NOTE — Telephone Encounter (Signed)
Spoke with the pt and notified of recs per CDY  He verbalized understanding and rx was sent to pharm  Nothing further needed

## 2016-05-29 NOTE — Telephone Encounter (Signed)
Spoke with the pt  He is c/o cough with yellow sputum and CP for the past wk  He states that his chest feels tight, but then gets very painful when he uses his advair and has to take a deep breath  He denies any SOB or wheezing  Last seen 05/12/16  Pending appt 09/23/16  Allergies  Allergen Reactions  . Bee Venom   . Seasonal Ic [Cholestatin]     Environmental Allergies   Current Outpatient Prescriptions on File Prior to Visit  Medication Sig Dispense Refill  . albuterol (PROVENTIL HFA) 108 (90 Base) MCG/ACT inhaler Inhale into the lungs every 6 (six) hours as needed for wheezing or shortness of breath.    Marland Kitchen amoxicillin-clavulanate (AUGMENTIN) 875-125 MG tablet Take 1 tablet by mouth 2 (two) times daily. 14 tablet 0  . ascorbic acid (VITAMIN C) 1000 MG tablet Take 1,000 mg by mouth daily. As needed    . celecoxib (CELEBREX) 200 MG capsule Take 1 capsule (200 mg total) by mouth daily. 30 capsule 2  . Cholecalciferol (VITAMIN D3) 5000 units CAPS Take by mouth.    . dexamethasone (DECADRON) 1 MG tablet Take at 9-10 pm, the night before blood test 1 tablet 0  . DHEA 25 MG CAPS Take 1 capsule by mouth daily. Reported on 12/19/2015    . EPINEPHrine (EPIPEN 2-PAK) 0.3 mg/0.3 mL SOAJ injection Inject 0.3 mg into the muscle once. Reported on 12/13/2015    . etodolac (LODINE) 400 MG tablet Take 400 mg by mouth 2 (two) times daily. Reported on 05/12/2016    . fluticasone (FLONASE) 50 MCG/ACT nasal spray Place 1-2 sprays into both nostrils 2 (two) times daily as needed for allergies or rhinitis. 16 g 12  . Fluticasone-Salmeterol (ADVAIR DISKUS) 500-50 MCG/DOSE AEPB Inhale 1 puff, then rinse mouth, twice daily 60 each 12  . gabapentin (NEURONTIN) 300 MG capsule TAKE 2 CAPSULES BY MOUTH EVERY MORNING, 1 IN THE AFTERNOON AND 2 EVERY NIGHT AT BEDTIME 150 capsule 2  . Glucosamine-Chondroit-Vit C-Mn (GLUCOSAMINE 1500 COMPLEX) CAPS Take 1 capsule by mouth daily. Reported on 12/19/2015    .  HYDROcodone-acetaminophen (NORCO) 10-325 MG tablet Take 1 tablet by mouth every 6 (six) hours as needed. 120 tablet 0  . magnesium citrate SOLN Take 296 mLs (1 Bottle total) by mouth once. 195 mL 0  . Multiple Vitamin (MULTIVITAMIN) tablet Take 1 tablet by mouth daily. Reported on 12/19/2015    . Olopatadine HCl (PATANASE) 0.6 % SOLN Place 1-2 drops into the nose 2 (two) times daily.    . Omega-3 Fatty Acids (FISH OIL) 1000 MG CAPS Take 1 capsule by mouth daily. Reported on 12/19/2015    . pantoprazole (PROTONIX) 40 MG tablet Take 1 tablet 30 minutes prior to breakfast. 30 tablet 3  . vitamin E (VITAMIN E) 400 UNIT capsule Take 400 Units by mouth 2 (two) times daily. Reported on 12/19/2015     No current facility-administered medications on file prior to visit.

## 2016-05-29 NOTE — Telephone Encounter (Signed)
Suggest refill last antibiotic to last for 10 days

## 2016-06-05 ENCOUNTER — Ambulatory Visit (INDEPENDENT_AMBULATORY_CARE_PROVIDER_SITE_OTHER): Payer: Medicare Other | Admitting: *Deleted

## 2016-06-05 DIAGNOSIS — J309 Allergic rhinitis, unspecified: Secondary | ICD-10-CM

## 2016-06-13 ENCOUNTER — Encounter: Payer: Medicare Other | Attending: Physical Medicine & Rehabilitation | Admitting: Registered Nurse

## 2016-06-13 ENCOUNTER — Encounter: Payer: Self-pay | Admitting: Registered Nurse

## 2016-06-13 ENCOUNTER — Ambulatory Visit (INDEPENDENT_AMBULATORY_CARE_PROVIDER_SITE_OTHER): Payer: Medicare Other | Admitting: *Deleted

## 2016-06-13 VITALS — BP 121/74 | HR 68 | Resp 16

## 2016-06-13 DIAGNOSIS — Z5181 Encounter for therapeutic drug level monitoring: Secondary | ICD-10-CM | POA: Insufficient documentation

## 2016-06-13 DIAGNOSIS — M25521 Pain in right elbow: Secondary | ICD-10-CM

## 2016-06-13 DIAGNOSIS — IMO0001 Reserved for inherently not codable concepts without codable children: Secondary | ICD-10-CM

## 2016-06-13 DIAGNOSIS — M25511 Pain in right shoulder: Secondary | ICD-10-CM

## 2016-06-13 DIAGNOSIS — J309 Allergic rhinitis, unspecified: Secondary | ICD-10-CM | POA: Diagnosis not present

## 2016-06-13 DIAGNOSIS — M609 Myositis, unspecified: Secondary | ICD-10-CM

## 2016-06-13 DIAGNOSIS — Z79899 Other long term (current) drug therapy: Secondary | ICD-10-CM | POA: Diagnosis present

## 2016-06-13 DIAGNOSIS — S8410XS Injury of peroneal nerve at lower leg level, unspecified leg, sequela: Secondary | ICD-10-CM | POA: Diagnosis not present

## 2016-06-13 DIAGNOSIS — M47817 Spondylosis without myelopathy or radiculopathy, lumbosacral region: Secondary | ICD-10-CM | POA: Diagnosis not present

## 2016-06-13 DIAGNOSIS — M25561 Pain in right knee: Secondary | ICD-10-CM

## 2016-06-13 DIAGNOSIS — M791 Myalgia: Secondary | ICD-10-CM

## 2016-06-13 DIAGNOSIS — G894 Chronic pain syndrome: Secondary | ICD-10-CM

## 2016-06-13 DIAGNOSIS — G8929 Other chronic pain: Secondary | ICD-10-CM

## 2016-06-13 MED ORDER — HYDROCODONE-ACETAMINOPHEN 10-325 MG PO TABS: 1.0000 | ORAL_TABLET | Freq: Four times a day (QID) | ORAL | 0 refills | Status: DC | PRN

## 2016-06-13 MED ORDER — GABAPENTIN 300 MG PO CAPS: ORAL_CAPSULE | ORAL | 3 refills | Status: DC

## 2016-06-13 MED ORDER — CELECOXIB 200 MG PO CAPS: 200.0000 mg | ORAL_CAPSULE | Freq: Every day | ORAL | 3 refills | Status: DC
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
## 2016-06-17 DIAGNOSIS — J309 Allergic rhinitis, unspecified: Secondary | ICD-10-CM | POA: Diagnosis not present

## 2016-06-20 ENCOUNTER — Ambulatory Visit (INDEPENDENT_AMBULATORY_CARE_PROVIDER_SITE_OTHER): Payer: Medicare Other | Admitting: *Deleted

## 2016-06-20 DIAGNOSIS — J309 Allergic rhinitis, unspecified: Secondary | ICD-10-CM | POA: Diagnosis not present

## 2016-06-27 ENCOUNTER — Ambulatory Visit (INDEPENDENT_AMBULATORY_CARE_PROVIDER_SITE_OTHER): Payer: Medicare Other | Admitting: *Deleted

## 2016-06-27 DIAGNOSIS — J309 Allergic rhinitis, unspecified: Secondary | ICD-10-CM

## 2016-07-04 ENCOUNTER — Ambulatory Visit (INDEPENDENT_AMBULATORY_CARE_PROVIDER_SITE_OTHER): Payer: Medicare Other | Admitting: *Deleted

## 2016-07-04 DIAGNOSIS — J309 Allergic rhinitis, unspecified: Secondary | ICD-10-CM | POA: Diagnosis not present

## 2016-07-11 ENCOUNTER — Ambulatory Visit (INDEPENDENT_AMBULATORY_CARE_PROVIDER_SITE_OTHER): Payer: Medicare Other

## 2016-07-11 DIAGNOSIS — J309 Allergic rhinitis, unspecified: Secondary | ICD-10-CM

## 2016-07-16 ENCOUNTER — Encounter: Payer: Medicare Other | Attending: Physical Medicine & Rehabilitation | Admitting: Registered Nurse

## 2016-07-16 ENCOUNTER — Encounter: Payer: Self-pay | Admitting: Registered Nurse

## 2016-07-16 VITALS — BP 124/75 | HR 65

## 2016-07-16 DIAGNOSIS — S8410XS Injury of peroneal nerve at lower leg level, unspecified leg, sequela: Secondary | ICD-10-CM

## 2016-07-16 DIAGNOSIS — M659 Synovitis and tenosynovitis, unspecified: Secondary | ICD-10-CM

## 2016-07-16 DIAGNOSIS — Z5181 Encounter for therapeutic drug level monitoring: Secondary | ICD-10-CM | POA: Insufficient documentation

## 2016-07-16 DIAGNOSIS — Z79899 Other long term (current) drug therapy: Secondary | ICD-10-CM | POA: Diagnosis present

## 2016-07-16 DIAGNOSIS — M25561 Pain in right knee: Secondary | ICD-10-CM

## 2016-07-16 DIAGNOSIS — M791 Myalgia: Secondary | ICD-10-CM

## 2016-07-16 DIAGNOSIS — M47817 Spondylosis without myelopathy or radiculopathy, lumbosacral region: Secondary | ICD-10-CM

## 2016-07-16 DIAGNOSIS — G894 Chronic pain syndrome: Secondary | ICD-10-CM

## 2016-07-16 DIAGNOSIS — M25511 Pain in right shoulder: Secondary | ICD-10-CM | POA: Diagnosis not present

## 2016-07-16 DIAGNOSIS — M609 Myositis, unspecified: Secondary | ICD-10-CM

## 2016-07-16 DIAGNOSIS — IMO0001 Reserved for inherently not codable concepts without codable children: Secondary | ICD-10-CM

## 2016-07-16 DIAGNOSIS — M25521 Pain in right elbow: Secondary | ICD-10-CM

## 2016-07-16 DIAGNOSIS — G8929 Other chronic pain: Secondary | ICD-10-CM

## 2016-07-16 DIAGNOSIS — M778 Other enthesopathies, not elsewhere classified: Secondary | ICD-10-CM

## 2016-07-16 MED ORDER — HYDROCODONE-ACETAMINOPHEN 10-325 MG PO TABS: 1.0000 | ORAL_TABLET | Freq: Four times a day (QID) | ORAL | 0 refills | Status: DC | PRN
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
## 2016-07-18 ENCOUNTER — Ambulatory Visit (INDEPENDENT_AMBULATORY_CARE_PROVIDER_SITE_OTHER): Payer: Medicare Other | Admitting: *Deleted

## 2016-07-18 DIAGNOSIS — J309 Allergic rhinitis, unspecified: Secondary | ICD-10-CM | POA: Diagnosis not present

## 2016-07-24 LAB — TOXASSURE SELECT,+ANTIDEPR,UR

## 2016-07-25 ENCOUNTER — Ambulatory Visit (INDEPENDENT_AMBULATORY_CARE_PROVIDER_SITE_OTHER): Payer: Medicare Other

## 2016-07-25 DIAGNOSIS — J309 Allergic rhinitis, unspecified: Secondary | ICD-10-CM | POA: Diagnosis not present

## 2016-07-28 NOTE — Progress Notes (Signed)
Urine drug screen for this encounter is consistent for prescribed medication 

## 2016-08-01 ENCOUNTER — Ambulatory Visit (INDEPENDENT_AMBULATORY_CARE_PROVIDER_SITE_OTHER): Payer: Medicare Other | Admitting: *Deleted

## 2016-08-01 DIAGNOSIS — J309 Allergic rhinitis, unspecified: Secondary | ICD-10-CM

## 2016-08-07 ENCOUNTER — Ambulatory Visit: Payer: Medicare Other

## 2016-08-08 ENCOUNTER — Ambulatory Visit (INDEPENDENT_AMBULATORY_CARE_PROVIDER_SITE_OTHER): Payer: Medicare Other

## 2016-08-08 DIAGNOSIS — J309 Allergic rhinitis, unspecified: Secondary | ICD-10-CM

## 2016-08-14 ENCOUNTER — Encounter: Payer: Medicare Other | Attending: Physical Medicine & Rehabilitation | Admitting: Registered Nurse

## 2016-08-14 ENCOUNTER — Encounter: Payer: Self-pay | Admitting: Registered Nurse

## 2016-08-14 ENCOUNTER — Ambulatory Visit (INDEPENDENT_AMBULATORY_CARE_PROVIDER_SITE_OTHER): Payer: Medicare Other | Admitting: *Deleted

## 2016-08-14 VITALS — BP 126/67 | HR 69 | Resp 14

## 2016-08-14 DIAGNOSIS — J309 Allergic rhinitis, unspecified: Secondary | ICD-10-CM | POA: Diagnosis not present

## 2016-08-14 DIAGNOSIS — M25511 Pain in right shoulder: Secondary | ICD-10-CM | POA: Diagnosis not present

## 2016-08-14 DIAGNOSIS — S8410XS Injury of peroneal nerve at lower leg level, unspecified leg, sequela: Secondary | ICD-10-CM | POA: Diagnosis not present

## 2016-08-14 DIAGNOSIS — Z79899 Other long term (current) drug therapy: Secondary | ICD-10-CM

## 2016-08-14 DIAGNOSIS — G894 Chronic pain syndrome: Secondary | ICD-10-CM

## 2016-08-14 DIAGNOSIS — M47817 Spondylosis without myelopathy or radiculopathy, lumbosacral region: Secondary | ICD-10-CM | POA: Diagnosis not present

## 2016-08-14 DIAGNOSIS — G8929 Other chronic pain: Secondary | ICD-10-CM

## 2016-08-14 DIAGNOSIS — Z5181 Encounter for therapeutic drug level monitoring: Secondary | ICD-10-CM

## 2016-08-14 DIAGNOSIS — M778 Other enthesopathies, not elsewhere classified: Secondary | ICD-10-CM

## 2016-08-14 DIAGNOSIS — M25561 Pain in right knee: Secondary | ICD-10-CM | POA: Diagnosis not present

## 2016-08-14 MED ORDER — HYDROCODONE-ACETAMINOPHEN 10-325 MG PO TABS: 1.0000 | ORAL_TABLET | Freq: Four times a day (QID) | ORAL | 0 refills | Status: DC | PRN
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
## 2016-08-22 ENCOUNTER — Ambulatory Visit (INDEPENDENT_AMBULATORY_CARE_PROVIDER_SITE_OTHER): Payer: Medicare Other | Admitting: *Deleted

## 2016-08-22 DIAGNOSIS — J309 Allergic rhinitis, unspecified: Secondary | ICD-10-CM | POA: Diagnosis not present

## 2016-08-29 ENCOUNTER — Ambulatory Visit (INDEPENDENT_AMBULATORY_CARE_PROVIDER_SITE_OTHER): Payer: Medicare Other | Admitting: *Deleted

## 2016-08-29 DIAGNOSIS — J309 Allergic rhinitis, unspecified: Secondary | ICD-10-CM

## 2016-09-05 ENCOUNTER — Ambulatory Visit (INDEPENDENT_AMBULATORY_CARE_PROVIDER_SITE_OTHER): Payer: Medicare Other | Admitting: *Deleted

## 2016-09-05 ENCOUNTER — Ambulatory Visit (INDEPENDENT_AMBULATORY_CARE_PROVIDER_SITE_OTHER): Payer: Medicare Other

## 2016-09-05 DIAGNOSIS — J309 Allergic rhinitis, unspecified: Secondary | ICD-10-CM

## 2016-09-05 DIAGNOSIS — Z23 Encounter for immunization: Secondary | ICD-10-CM | POA: Diagnosis not present

## 2016-09-12 ENCOUNTER — Ambulatory Visit (HOSPITAL_BASED_OUTPATIENT_CLINIC_OR_DEPARTMENT_OTHER): Payer: Medicare Other | Admitting: Physical Medicine & Rehabilitation

## 2016-09-12 ENCOUNTER — Ambulatory Visit (INDEPENDENT_AMBULATORY_CARE_PROVIDER_SITE_OTHER): Payer: Medicare Other | Admitting: *Deleted

## 2016-09-12 ENCOUNTER — Encounter: Payer: Self-pay | Admitting: Physical Medicine & Rehabilitation

## 2016-09-12 ENCOUNTER — Encounter: Payer: Medicare Other | Attending: Physical Medicine & Rehabilitation

## 2016-09-12 VITALS — BP 142/79 | HR 75 | Resp 14

## 2016-09-12 DIAGNOSIS — M47817 Spondylosis without myelopathy or radiculopathy, lumbosacral region: Secondary | ICD-10-CM

## 2016-09-12 DIAGNOSIS — M25511 Pain in right shoulder: Secondary | ICD-10-CM | POA: Diagnosis not present

## 2016-09-12 DIAGNOSIS — Z79899 Other long term (current) drug therapy: Secondary | ICD-10-CM | POA: Insufficient documentation

## 2016-09-12 DIAGNOSIS — G8929 Other chronic pain: Secondary | ICD-10-CM | POA: Diagnosis not present

## 2016-09-12 DIAGNOSIS — M25561 Pain in right knee: Secondary | ICD-10-CM

## 2016-09-12 DIAGNOSIS — Z5181 Encounter for therapeutic drug level monitoring: Secondary | ICD-10-CM | POA: Insufficient documentation

## 2016-09-12 DIAGNOSIS — J309 Allergic rhinitis, unspecified: Secondary | ICD-10-CM

## 2016-09-12 MED ORDER — HYDROCODONE-ACETAMINOPHEN 10-325 MG PO TABS: 1.0000 | ORAL_TABLET | Freq: Four times a day (QID) | ORAL | 0 refills | Status: DC | PRN

## 2016-09-12 NOTE — Patient Instructions (Signed)
Please follow-up with your primary physician. If your blood pressure remains elevated greater than 140 over 80  Follow-up with the orthopedic surgeon regarding your knee pain on the right side as well as right shoulder pain.
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
## 2016-09-19 ENCOUNTER — Ambulatory Visit (INDEPENDENT_AMBULATORY_CARE_PROVIDER_SITE_OTHER): Payer: Medicare Other | Admitting: *Deleted

## 2016-09-19 DIAGNOSIS — J309 Allergic rhinitis, unspecified: Secondary | ICD-10-CM | POA: Diagnosis not present

## 2016-09-23 ENCOUNTER — Encounter: Payer: Self-pay | Admitting: Internal Medicine

## 2016-09-23 ENCOUNTER — Ambulatory Visit (INDEPENDENT_AMBULATORY_CARE_PROVIDER_SITE_OTHER): Payer: Medicare Other | Admitting: Internal Medicine

## 2016-09-23 VITALS — BP 128/78 | HR 58 | Ht 74.0 in | Wt 242.2 lb

## 2016-09-23 DIAGNOSIS — J4489 Other specified chronic obstructive pulmonary disease: Secondary | ICD-10-CM

## 2016-09-23 DIAGNOSIS — J3089 Other allergic rhinitis: Secondary | ICD-10-CM | POA: Diagnosis not present

## 2016-09-23 DIAGNOSIS — K219 Gastro-esophageal reflux disease without esophagitis: Secondary | ICD-10-CM

## 2016-09-23 DIAGNOSIS — J302 Other seasonal allergic rhinitis: Secondary | ICD-10-CM

## 2016-09-23 DIAGNOSIS — J4540 Moderate persistent asthma, uncomplicated: Secondary | ICD-10-CM

## 2016-09-23 DIAGNOSIS — R918 Other nonspecific abnormal finding of lung field: Secondary | ICD-10-CM

## 2016-09-23 DIAGNOSIS — J449 Chronic obstructive pulmonary disease, unspecified: Secondary | ICD-10-CM

## 2016-09-23 MED ORDER — DOXYCYCLINE HYCLATE 100 MG PO TABS
100.0000 mg | ORAL_TABLET | Freq: Two times a day (BID) | ORAL | 1 refills | Status: DC
Start: 2016-09-23 — End: 2017-02-26

## 2016-09-23 MED ORDER — FLUTICASONE PROPIONATE 50 MCG/ACT NA SUSP: 1.0000 | Freq: Two times a day (BID) | NASAL | 12 refills | Status: DC | PRN

## 2016-09-23 MED ORDER — FLUTICASONE-SALMETEROL 500-50 MCG/DOSE IN AEPB: INHALATION_SPRAY | RESPIRATORY_TRACT | 12 refills | Status: DC

## 2016-09-23 NOTE — Assessment & Plan Note (Signed)
Reminded ongoing strict attention to reflux precautions

## 2016-09-23 NOTE — Assessment & Plan Note (Signed)
He has mild to moderate persistent asthmatic bronchitis. There is an allergy component but I suspect recurrent low-grade aspiration consistent with his GERD and demonstrated reflux. Plan-reemphasize reflux precautions. Try doxycycline. Refill Advair and Flonase. Consider trial of LABA/LAMA/ICS

## 2016-09-23 NOTE — Patient Instructions (Signed)
Order- office spirometry      Dx asthmatic bronchitis moderate persistent  Printed script for [facility] for Advair and for doxycycline antibiotic

## 2016-09-23 NOTE — Progress Notes (Signed)
Patient ID: Kristopher Gay, male    DOB: 01/05/1951, 65 y.o.   MRN: [id number]  HPI  M never smoker followed for allergic rhinitis and allergic asthma/ bronchitis, complicated by chronic musculoskeletal pain after an injury, GERD Barium Swallow 04/04/14.2 Moderate gastroesophageal reflux to the level of mid esophagus CT chest 01/11/2016-IMPRESSION: 1. 2.2 cm left adrenal adenoma.ower lobe  2. New bronchial wall thickening and nodular opacities in the right likely reflecting bronchiolitis    05/12/2016-65 year old male never smoker followed for Allergic rhinitis, allergic asthma/bronchitis, complicated by chronic musculoskeletal pain after an injury, GERD/confirmed reflux, grade 1 diastolic dysfunction, degenerative disc disease Allergy vaccine 1:10 [facility] FOLLOW FOR: allergies are not doing well.  he wants to go over CT Abdomen.  Showed thickness in lungs. Nasal congestion-needs refill steroid nasal spray. Chest congestion with persistent cough, thick clear mucus. 3 months. Worse with the exertion of mowing grass. Denies fever, sweat, blood or purulent sputum. We reviewed CT abdomen done for abdominal pain and incidentally showing lower zone bronchial thickening and small nodules. This pattern may been present on most recent chest x-rays.  09/23/2016-65 year old male never smoker followed for Allergic rhinitis, allergic asthma/bronchitis, complicated by chronic musculoskeletal pain after injury, GERD/reflux, grade 1 diastolic dysfunction, degenerative disc disease Allergy Vaccine 1:10 GH FOLLOWS FOR: Still on vaccine and no reactions. Wheezing, cough-productive-gray in color. Pt states he has noticed his O2 levels have dropped lower than usual-stays above 90%. He routinely expects to have some cough and wheeze antibiotic after last visit helped for about 2 weeks that he began coughing again and repeat treatment did not make a difference. Usually nonproductive. No fever or sweat. CT chest 05/22/2016 On  lung window image, much of the peribronchial vascular nodularity noted vertical in the right lower lobe on the prior CT has cleared. Linear scarring and/or atelectasis remains in both lower lobes posteriorly. No suspicious lung nodule is seen. No parenchymal infiltrate is seen. There is no evidence of pleural effusion. The central airway is patent. The thoracic vertebrae are in normal alignment with degenerative change diffusely. No compression deformity is seen. On soft tissue window images, the thyroid gland is unremarkable. On this unenhanced study, only small mediastinal lymph nodes are present. The mid ascending thoracic aorta measures 38 mm in Diameter. Office Spirometry 09/23/2016-mild restriction of exhaled volume. FVC 3.75/71%, FEV1 3.01/76%, ratio 0.80, FEF 25-75% 2.96/95%.  ROS-see HPI Constitutional:   No-   weight loss, night sweats, fevers, chills, fatigue, lassitude. HEENT:   No-  headaches, difficulty swallowing, tooth/dental problems, +sore throat,       +sneezing, itching, ear ache, + nasal congestion, +post nasal drip,  CV:  +chest pain, no-orthopnea, PND, swelling in lower extremities, anasarca,  dizziness, palpitations Resp: No-   shortness of breath with exertion or at rest.             + productive cough,  + non-productive cough,  No- coughing up of blood.              No-   change in color of mucus.  +wheezing.   Skin: No-   rash or lesions. GI:  No-   heartburn, indigestion, abdominal pain, nausea, vomiting,  GU: . MS: +  joint pain or swelling.  No- decreased range of motion.  + back pain. Neuro-     nothing unusual Psych:  No- change in mood or affect. + depression or anxiety.  No memory loss.  OBJ- Physical Exam General- Alert, Oriented, Affect-appropriate, Distress- none  acute. Big/ muscular Skin- rash-none, lesions- none, excoriation- none Lymphadenopathy- none Head- atraumatic            Eyes- Gross vision intact, PERRLA, conjunctivae and secretions  clear            Ears- Hearing, canals-normal            Nose- Clear, no-Septal dev, mucus, polyps, erosion, perforation             Throat- Mallampati II , mucosa clear(throat lozenges) , drainage- none, tonsils- atrophic Neck- flexible , trachea midline, no stridor , thyroid nl, carotid no bruit Chest - symmetrical excursion , unlabored           Heart/CV- RRR , no murmur , no gallop  , no rub, nl s1 s2                           - JVD- none , edema- none, stasis changes- none, varices- none           Lung- + bibasilar rhonchi and squeaks with little wheeze , dullness-none, rub- none           Chest wall- not obviously tender Abd-  Br/ Gen/ Rectal- Not done, not indicated Extrem- cyanosis- none, clubbing, none, atrophy- none, strength- muscular. + Brace L lower leg, + cane Neuro- grossly intact to observation

## 2016-09-23 NOTE — Assessment & Plan Note (Signed)
We will be closing the allergy vaccine program at this office is high slow down some. I am recommending he use up and stop current vaccine supply then watch. If necessary he can associate with one of the other allergy programs in town.

## 2016-09-26 ENCOUNTER — Ambulatory Visit (INDEPENDENT_AMBULATORY_CARE_PROVIDER_SITE_OTHER): Payer: Medicare Other | Admitting: *Deleted

## 2016-09-26 DIAGNOSIS — J309 Allergic rhinitis, unspecified: Secondary | ICD-10-CM

## 2016-10-03 ENCOUNTER — Ambulatory Visit: Payer: Medicare Other

## 2016-10-09 ENCOUNTER — Telehealth: Payer: Self-pay | Admitting: Internal Medicine

## 2016-10-09 NOTE — Telephone Encounter (Signed)
Will forward to Alroy Bailiff to make sure Allergy Rx-serum mix is sent [facility]. Dr Bruna Potter group is able to see our records in [facility].

## 2016-10-10 NOTE — Telephone Encounter (Signed)
Faxed contents of vaccine to [facility] as requested. Will leave encounter open to give their office plenty of time to see if they received the fax.

## 2016-10-13 ENCOUNTER — Ambulatory Visit (HOSPITAL_BASED_OUTPATIENT_CLINIC_OR_DEPARTMENT_OTHER): Payer: Medicare Other | Admitting: Physical Medicine & Rehabilitation

## 2016-10-13 ENCOUNTER — Encounter: Payer: Self-pay | Admitting: Physical Medicine & Rehabilitation

## 2016-10-13 ENCOUNTER — Encounter: Payer: Medicare Other | Attending: Physical Medicine & Rehabilitation

## 2016-10-13 VITALS — BP 144/77 | HR 74

## 2016-10-13 DIAGNOSIS — G8929 Other chronic pain: Secondary | ICD-10-CM | POA: Diagnosis not present

## 2016-10-13 DIAGNOSIS — M25511 Pain in right shoulder: Secondary | ICD-10-CM

## 2016-10-13 DIAGNOSIS — M25561 Pain in right knee: Secondary | ICD-10-CM | POA: Diagnosis not present

## 2016-10-13 DIAGNOSIS — M47817 Spondylosis without myelopathy or radiculopathy, lumbosacral region: Secondary | ICD-10-CM

## 2016-10-13 DIAGNOSIS — Z79899 Other long term (current) drug therapy: Secondary | ICD-10-CM | POA: Diagnosis present

## 2016-10-13 DIAGNOSIS — Z5181 Encounter for therapeutic drug level monitoring: Secondary | ICD-10-CM | POA: Insufficient documentation

## 2016-10-13 MED ORDER — HYDROCODONE-ACETAMINOPHEN 10-325 MG PO TABS: 1.0000 | ORAL_TABLET | Freq: Four times a day (QID) | ORAL | 0 refills | Status: DC | PRN

## 2016-10-13 MED ORDER — GABAPENTIN 300 MG PO CAPS: ORAL_CAPSULE | ORAL | 3 refills | Status: DC

## 2016-10-13 MED ORDER — CELECOXIB 200 MG PO CAPS: 200.0000 mg | ORAL_CAPSULE | Freq: Every day | ORAL | 3 refills | Status: DC

## 2016-10-13 NOTE — Patient Instructions (Signed)
You can reduce  medication dose if you'd like to find out whether this may be causing some slowness of thinking, please discuss with Zella Ball next month
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
## 2016-10-15 NOTE — Telephone Encounter (Signed)
Apparently [facility] [place] received the fax I sent.(requested paperwork) I haven't heard from them. Nothing further needed. Closing.

## 2016-10-16 ENCOUNTER — Encounter: Payer: Self-pay | Admitting: Allergy

## 2016-10-16 ENCOUNTER — Ambulatory Visit (INDEPENDENT_AMBULATORY_CARE_PROVIDER_SITE_OTHER): Payer: Medicare Other | Admitting: Allergy

## 2016-10-16 VITALS — BP 124/72 | HR 64 | Temp 97.7°F | Resp 15 | Ht 71.5 in | Wt 240.4 lb

## 2016-10-16 DIAGNOSIS — J301 Allergic rhinitis due to pollen: Secondary | ICD-10-CM | POA: Diagnosis not present

## 2016-10-16 DIAGNOSIS — J4540 Moderate persistent asthma, uncomplicated: Secondary | ICD-10-CM | POA: Diagnosis not present

## 2016-10-16 MED ORDER — MONTELUKAST SODIUM 10 MG PO TABS: 10.0000 mg | ORAL_TABLET | Freq: Every day | ORAL | 5 refills | Status: DC

## 2016-10-16 NOTE — Progress Notes (Signed)
New Patient Note  RE: [name] MRN: [id number] DOB: [birth year] Date of Office Visit: 10/16/2016  Referring provider: No ref. provider found  Primary care provider: No PCP Per Patient Dr. Naaman Plummer  Chief Complaint:  Resume allergy shots  History of present illness: Kristopher Gay is a 65 y.o. male presenting today for consultation for allergen immunotherapy.   He was on allergy shots thru Dr. Annamaria Boots who will no longer be providing allergen immunotherapy.  He presents today to establish care in order to continue allergen immunotherapy.  He stopped allergy shot about 3 weeks ago.   He did not have any more vials with Dr. Janee Morn office and they were not going to make new ones for him.  He has had worsening of his runny nose and sinus pain since stopping injection.   He also takes flonase 1-2 sprays in each nostril daily.  He takes allegra daily for the past 6 weeks.    He has been on allergy shots since 2002 receiving weekly injections.  Records provided from Dr. Janee Morn office indicate that he was receiving one shot that included grass, tree, dust mite and Cladosporium at a 1:10 concentration at dose 0.5 ML  He reports having SOB and wheezing as well as chest tightness.  He is on Advair diskus 500-50 that he takes twice a day.  He uses albuterol several times a month.  He plans to continue his asthma care with Dr. Annamaria Boots.     Review of systems: Review of Systems  Constitutional: Negative for chills, fever and malaise/fatigue.  HENT: Positive for congestion and sinus pain. Negative for ear pain, nosebleeds and sore throat.   Eyes: Negative for discharge and redness.  Respiratory: Negative for cough, shortness of breath and wheezing.   Cardiovascular: Negative for chest pain.  Gastrointestinal: Negative for heartburn, nausea and vomiting.  Skin: Negative for itching and rash.    All other systems negative unless noted above in HPI  Past medical history: Past Medical History:    Diagnosis Date  . Allergic rhinitis, cause unspecified   . Allergy   . Anxiety   . Blood clot in vein    left calf  . Chronic headaches   . COPD (chronic obstructive pulmonary disease) ([place])   . DDD (degenerative disc disease)   . Depression   . Emphysema of lung ([place])   . Extrinsic asthma, unspecified   . Neuromuscular disorder ([place])   . Unspecified asthma(493.90)     Past surgical history: Past Surgical History:  Procedure Laterality Date  . DVT and vein stripping left calf without PE  2002  . ROTATOR CUFF REPAIR Left 2005   detached; left shoulder-reattached  . SHOULDER SURGERY Right 2014   lateral tendon torn    Family history:  Family History  Problem Relation Age of Onset  . Pancreatic cancer Mother   . Breast cancer Sister   . Epilepsy Brother   . Adrenal disorder Neg Hx     Social history: He lives in a home with carpeting with gas heating and central cooling. There are no pets in the home. He is a retired Pharmacist, hospital.   Medication List: Allergies as of 10/16/2016      Reactions   Bee Venom Swelling   Seasonal Ic [cholestatin]    Environmental Allergies      Medication List       Accurate as of 10/16/16 11:59 PM. Always use your most recent med list.  ascorbic acid 1000 MG tablet Commonly known as:  VITAMIN C Take 1,000 mg by mouth daily. As needed   celecoxib 200 MG capsule Commonly known as:  CELEBREX Take 1 capsule (200 mg total) by mouth daily.   dexamethasone 1 MG tablet Commonly known as:  DECADRON Take at 9-10 pm, the night before blood test   DHEA 25 MG Caps Take 1 capsule by mouth daily. Reported on 12/19/2015   doxycycline 100 MG tablet Commonly known as:  VIBRA-TABS Take 1 tablet (100 mg total) by mouth 2 (two) times daily.   EPIPEN 2-PAK 0.3 mg/0.3 mL Soaj injection Generic drug:  EPINEPHrine Inject 0.3 mg into the muscle once. Reported on 12/13/2015   Fish Oil 1000 MG Caps Take 1 capsule by mouth daily. Reported  on 12/19/2015   fluticasone 50 MCG/ACT nasal spray Commonly known as:  FLONASE Place 1-2 sprays into both nostrils 2 (two) times daily as needed for allergies or rhinitis.   Fluticasone-Salmeterol 500-50 MCG/DOSE Aepb Commonly known as:  ADVAIR DISKUS Inhale 1 puff, then rinse mouth, twice daily   gabapentin 300 MG capsule Commonly known as:  NEURONTIN TAKE 2 CAPSULES BY MOUTH EVERY MORNING, 1 IN THE AFTERNOON AND 2 EVERY NIGHT AT BEDTIME   GLUCOSAMINE 1500 COMPLEX Caps Take 1 capsule by mouth daily. Reported on 12/19/2015   HYDROcodone-acetaminophen 10-325 MG tablet Commonly known as:  NORCO Take 1 tablet by mouth every 6 (six) hours as needed.   magnesium citrate Soln Take 296 mLs (1 Bottle total) by mouth once.   montelukast 10 MG tablet Commonly known as:  SINGULAIR Take 1 tablet (10 mg total) by mouth at bedtime.   multivitamin tablet Take 1 tablet by mouth daily. Reported on 12/19/2015   pantoprazole 40 MG tablet Commonly known as:  PROTONIX Take 1 tablet 30 minutes prior to breakfast.   PATANASE 0.6 % Soln Generic drug:  Olopatadine HCl Place 1-2 drops into the nose 2 (two) times daily.   PROVENTIL HFA 108 (90 Base) MCG/ACT inhaler Generic drug:  albuterol Inhale into the lungs every 6 (six) hours as needed for wheezing or shortness of breath.   Vitamin D3 5000 units Caps Take by mouth.   vitamin E 400 UNIT capsule Generic drug:  vitamin E Take 400 Units by mouth 2 (two) times daily. Reported on 12/19/2015       Known medication allergies: Allergies  Allergen Reactions  . Bee Venom Swelling  . Seasonal Ic [Cholestatin]     Environmental Allergies     Physical examination: Blood pressure 124/72, pulse 64, temperature 97.7 F (36.5 C), temperature source Oral, resp. rate 15, height 5' 11.5" (1.816 m), weight 240 lb 6.4 oz (109 kg), SpO2 96 %.  General: Alert, interactive, in no acute distress. HEENT: TMs pearly gray, turbinates moderately edematous  with clear discharge, post-pharynx non erythematous. Neck: Supple without lymphadenopathy. Lungs: Clear to auscultation without wheezing, rhonchi or rales. {no increased work of breathing. CV: Normal S1, S2 without murmurs. Abdomen: Nondistended, nontender. Skin: Warm and dry, without lesions or rashes. Extremities:  No clubbing, cyanosis or edema. Neuro:   Grossly intact.  Diagnositics/Labs: Spirometry: FEV1: 3.01L  85%, FVC: 3.72L  83%, ratio consistent with Nonobstructive pattern  Allergy testing: unable to perform due to recent antihistamine use  Assessment and plan:   Allergic rhinitis  - . He would like to resume allergen immunotherapy.  In order to not have him start over from beginning with immunotherapy we are awaiting records from Dr.  Young's ofice with his immunotherapy prescription. Once we have info from his previous immunotherapy provider Iwill be able to make a new immunotherapy prescription for him.   As he is already 3 weeks from his last injection and likely will need several more weeks to make his vials will likely start him back at 0.1 ml.  He also will need 2 vials for pollens and another for mold and dust mite.    - will obtain environmental allergen profile.   If we need to base his immunotherapy off of his current allergen status will obtain labs. - allergen immunotherapy (allergy shot) information provided today - continue your Allegra 180mg  daily, Flonase 2 sprays each nostril daily - start Singulair 10mg  daily to help control your allergy symptoms while awaiting restart of your allergy shots.   - we will be in contact with you once we have additional information from Dr. Janee Morn office.   - immunotherapy consent signed  Asthma - continue your current asthma regimen of Advair and as needed albuterol as per Dr. Annamaria Boots  Follow-up 3 months   I appreciate the opportunity to take part in Nikki's care. Please do not hesitate to contact me with  questions.  Sincerely,   Prudy Feeler, MD Allergy/Immunology [facility] [place]

## 2016-10-16 NOTE — Patient Instructions (Addendum)
 -   will obtain environmental allergen profile   - we will continue to determine the make-up of your allergy shots from Dr. Annamaria Boots and if it is comparable to the extracts that we have we will be able to make your vials.    - allergen immunotherapy (allergy shot) information provided today  - continue your Allegra 180mg  daily, Flonase 2 sprays each nostril daily  - start Singulair 10mg  daily to help control your allergy symptoms while awaiting restart of your allergy shots.    - we will be in contact with you once we have additional information from Dr. Janee Morn office.    - continue your current asthma regimen of Advair and as needed albuterol  Follow-up 3 months

## 2016-10-21 LAB — CP584 ZONE 3
Allergen, A. alternata, m6: <0.1 kU/L
Allergen, Black Locust, Acacia9: <0.1 kU/L
Allergen, C. Herbarum, M2: <0.1 kU/L
Allergen, Cedar tree, t12: <0.1 kU/L
Allergen, Comm Silver Birch, t9: <0.1 kU/L
Allergen, D pternoyssinus,d7: <0.1 kU/L
Allergen, Mucor Racemosus, M4: <0.1 kU/L
Allergen, Mulberry, t76: <0.1 kU/L
Allergen, Oak,t7: <0.1 kU/L
Allergen, P. notatum, m1: <0.1 kU/L
Allergen, S. Botryosum, m10: <0.1 kU/L
Aspergillus fumigatus, m3: <0.1 kU/L
Bahia Grass: <0.1 kU/L
Bermuda Grass: <0.1 kU/L
Box Elder IgE: <0.1 kU/L
Cat Dander: <0.1 kU/L
Cockroach: <0.1 kU/L
Common Ragweed: <0.1 kU/L
D. farinae: <0.1 kU/L
Dog Dander: <0.1 kU/L
Elm IgE: <0.1 kU/L
Johnson Grass: <0.1 kU/L
Meadow Grass: <0.1 kU/L
Nettle: <0.1 kU/L
Pecan/Hickory Tree IgE: <0.1 kU/L
Plantain: <0.1 kU/L
Rough Pigweed  IgE: <0.1 kU/L

## 2016-11-12 ENCOUNTER — Encounter: Payer: Medicare Other | Admitting: Registered Nurse

## 2016-11-13 ENCOUNTER — Telehealth: Payer: Self-pay | Admitting: Physical Medicine & Rehabilitation

## 2016-11-13 ENCOUNTER — Ambulatory Visit: Payer: Medicare Other | Admitting: Allergy

## 2016-11-13 NOTE — Telephone Encounter (Signed)
Phoned to reschedule appt  - needs hydrocodone to last until jan 25th appt w Zella Ball putting  In basket he will pick up Friday jan 19th in office

## 2016-11-14 ENCOUNTER — Encounter: Payer: Self-pay | Admitting: Registered Nurse

## 2016-11-14 ENCOUNTER — Telehealth: Payer: Self-pay | Admitting: Registered Nurse

## 2016-11-14 ENCOUNTER — Encounter: Payer: Medicare Other | Attending: Physical Medicine & Rehabilitation | Admitting: Registered Nurse

## 2016-11-14 VITALS — BP 146/82 | HR 68

## 2016-11-14 DIAGNOSIS — Z79899 Other long term (current) drug therapy: Secondary | ICD-10-CM | POA: Diagnosis present

## 2016-11-14 DIAGNOSIS — M25511 Pain in right shoulder: Secondary | ICD-10-CM | POA: Diagnosis not present

## 2016-11-14 DIAGNOSIS — S8410XS Injury of peroneal nerve at lower leg level, unspecified leg, sequela: Secondary | ICD-10-CM

## 2016-11-14 DIAGNOSIS — M25561 Pain in right knee: Secondary | ICD-10-CM

## 2016-11-14 DIAGNOSIS — Z5181 Encounter for therapeutic drug level monitoring: Secondary | ICD-10-CM | POA: Diagnosis present

## 2016-11-14 DIAGNOSIS — G8929 Other chronic pain: Secondary | ICD-10-CM

## 2016-11-14 DIAGNOSIS — M47817 Spondylosis without myelopathy or radiculopathy, lumbosacral region: Secondary | ICD-10-CM

## 2016-11-14 DIAGNOSIS — G894 Chronic pain syndrome: Secondary | ICD-10-CM

## 2016-11-14 MED ORDER — HYDROCODONE-ACETAMINOPHEN 10-325 MG PO TABS: 1.0000 | ORAL_TABLET | Freq: Four times a day (QID) | ORAL | 0 refills | Status: DC | PRN

## 2016-11-14 NOTE — Telephone Encounter (Signed)
Return Kristopher Gay call, According to [facility] he picked up his Hydrocodone on 10/13/2016. Placed a call to Kristopher Gay that he can be seen today, and we can cancel his next week appointment. Awaiting a return the call.
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
## 2016-11-18 ENCOUNTER — Ambulatory Visit (INDEPENDENT_AMBULATORY_CARE_PROVIDER_SITE_OTHER): Payer: Medicare Other | Admitting: Allergy and Immunology

## 2016-11-18 ENCOUNTER — Other Ambulatory Visit: Payer: Self-pay | Admitting: Allergy

## 2016-11-18 ENCOUNTER — Encounter: Payer: Medicare Other | Admitting: Allergy and Immunology

## 2016-11-18 ENCOUNTER — Encounter: Payer: Self-pay | Admitting: Allergy and Immunology

## 2016-11-18 VITALS — BP 138/90 | HR 60 | Resp 18

## 2016-11-18 DIAGNOSIS — J301 Allergic rhinitis due to pollen: Secondary | ICD-10-CM | POA: Diagnosis not present

## 2016-11-18 DIAGNOSIS — J3089 Other allergic rhinitis: Secondary | ICD-10-CM

## 2016-11-18 DIAGNOSIS — J4540 Moderate persistent asthma, uncomplicated: Secondary | ICD-10-CM

## 2016-11-18 NOTE — Progress Notes (Signed)
Kristopher Gay returns to this clinic that immunotherapy performed. He demonstrated hypersensitivity against house dust mite and perennial mold. He was given instructions on how to perform allergen avoidance measures. He elected to start a course immunotherapy directed against these aeroallergens. He will follow-up with Dr. Nelva Bush in approximately 8 weeks for further evaluation.

## 2016-11-19 NOTE — Progress Notes (Signed)
Vials to be made 11-09-16.  jm

## 2016-11-20 ENCOUNTER — Ambulatory Visit: Payer: Medicare Other | Admitting: Registered Nurse

## 2016-11-21 ENCOUNTER — Encounter: Payer: Self-pay | Admitting: Allergy and Immunology

## 2016-11-21 DIAGNOSIS — J3089 Other allergic rhinitis: Secondary | ICD-10-CM | POA: Diagnosis not present

## 2016-11-21 LAB — TOXASSURE SELECT,+ANTIDEPR,UR

## 2016-11-24 NOTE — Progress Notes (Signed)
Urine drug screen for this encounter is consistent for prescribed medication 

## 2016-12-02 ENCOUNTER — Ambulatory Visit (INDEPENDENT_AMBULATORY_CARE_PROVIDER_SITE_OTHER): Payer: Medicare Other

## 2016-12-02 DIAGNOSIS — J309 Allergic rhinitis, unspecified: Secondary | ICD-10-CM

## 2016-12-02 NOTE — Progress Notes (Signed)
Immunotherapy   Patient Details  Name: Kristopher Gay MRN: [id number] Date of Birth: 17-Dec-1950  12/02/2016  Kristopher Gay started injections Following schedule: B  Frequency:ONCE WEEKLY Epi-Pen:YES Consent signed and patient instructions given.   Kristopher Gay 12/02/2016, 3:06 PM

## 2016-12-11 ENCOUNTER — Ambulatory Visit (INDEPENDENT_AMBULATORY_CARE_PROVIDER_SITE_OTHER): Payer: Medicare Other

## 2016-12-11 ENCOUNTER — Encounter: Payer: Self-pay | Admitting: Registered Nurse

## 2016-12-11 ENCOUNTER — Encounter: Payer: Medicare Other | Attending: Physical Medicine & Rehabilitation | Admitting: Registered Nurse

## 2016-12-11 ENCOUNTER — Other Ambulatory Visit: Payer: Self-pay | Admitting: Internal Medicine

## 2016-12-11 VITALS — BP 121/74 | HR 70

## 2016-12-11 DIAGNOSIS — J309 Allergic rhinitis, unspecified: Secondary | ICD-10-CM

## 2016-12-11 DIAGNOSIS — G894 Chronic pain syndrome: Secondary | ICD-10-CM

## 2016-12-11 DIAGNOSIS — M47817 Spondylosis without myelopathy or radiculopathy, lumbosacral region: Secondary | ICD-10-CM | POA: Diagnosis not present

## 2016-12-11 DIAGNOSIS — M25552 Pain in left hip: Secondary | ICD-10-CM

## 2016-12-11 DIAGNOSIS — M25561 Pain in right knee: Secondary | ICD-10-CM

## 2016-12-11 DIAGNOSIS — Z5181 Encounter for therapeutic drug level monitoring: Secondary | ICD-10-CM | POA: Insufficient documentation

## 2016-12-11 DIAGNOSIS — S8410XS Injury of peroneal nerve at lower leg level, unspecified leg, sequela: Secondary | ICD-10-CM

## 2016-12-11 DIAGNOSIS — G8929 Other chronic pain: Secondary | ICD-10-CM

## 2016-12-11 DIAGNOSIS — Z79899 Other long term (current) drug therapy: Secondary | ICD-10-CM | POA: Diagnosis present

## 2016-12-11 DIAGNOSIS — M25511 Pain in right shoulder: Secondary | ICD-10-CM | POA: Diagnosis not present

## 2016-12-11 MED ORDER — HYDROCODONE-ACETAMINOPHEN 10-325 MG PO TABS: 1.0000 | ORAL_TABLET | Freq: Four times a day (QID) | ORAL | 0 refills | Status: DC | PRN
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
## 2016-12-12 ENCOUNTER — Encounter: Payer: Self-pay | Admitting: Registered Nurse

## 2016-12-12 ENCOUNTER — Telehealth: Payer: Self-pay | Admitting: Registered Nurse

## 2016-12-12 NOTE — Telephone Encounter (Signed)
On 12/12/2016 the [place] was reviewed no conflict was seen on the [place] with multiple prescribers. Kristopher Gay has a signed narcotic contract with our office. If there were any discrepancies this would have been reported to his physician.

## 2016-12-18 ENCOUNTER — Ambulatory Visit (INDEPENDENT_AMBULATORY_CARE_PROVIDER_SITE_OTHER): Payer: Medicare Other | Admitting: *Deleted

## 2016-12-18 DIAGNOSIS — J301 Allergic rhinitis due to pollen: Secondary | ICD-10-CM

## 2016-12-25 ENCOUNTER — Ambulatory Visit (INDEPENDENT_AMBULATORY_CARE_PROVIDER_SITE_OTHER): Payer: Medicare Other | Admitting: *Deleted

## 2016-12-25 DIAGNOSIS — J309 Allergic rhinitis, unspecified: Secondary | ICD-10-CM

## 2017-01-01 ENCOUNTER — Ambulatory Visit (INDEPENDENT_AMBULATORY_CARE_PROVIDER_SITE_OTHER): Payer: Medicare Other

## 2017-01-01 DIAGNOSIS — J309 Allergic rhinitis, unspecified: Secondary | ICD-10-CM

## 2017-01-07 ENCOUNTER — Encounter: Payer: Medicare Other | Attending: Physical Medicine & Rehabilitation | Admitting: Registered Nurse

## 2017-01-07 ENCOUNTER — Ambulatory Visit (INDEPENDENT_AMBULATORY_CARE_PROVIDER_SITE_OTHER): Payer: Medicare Other

## 2017-01-07 ENCOUNTER — Encounter: Payer: Self-pay | Admitting: Registered Nurse

## 2017-01-07 VITALS — BP 131/80 | HR 58 | Resp 14

## 2017-01-07 DIAGNOSIS — G894 Chronic pain syndrome: Secondary | ICD-10-CM

## 2017-01-07 DIAGNOSIS — J309 Allergic rhinitis, unspecified: Secondary | ICD-10-CM | POA: Diagnosis not present

## 2017-01-07 DIAGNOSIS — Z79899 Other long term (current) drug therapy: Secondary | ICD-10-CM

## 2017-01-07 DIAGNOSIS — Z5181 Encounter for therapeutic drug level monitoring: Secondary | ICD-10-CM

## 2017-01-07 DIAGNOSIS — M25511 Pain in right shoulder: Secondary | ICD-10-CM

## 2017-01-07 DIAGNOSIS — M25561 Pain in right knee: Secondary | ICD-10-CM | POA: Diagnosis not present

## 2017-01-07 DIAGNOSIS — M47817 Spondylosis without myelopathy or radiculopathy, lumbosacral region: Secondary | ICD-10-CM | POA: Diagnosis not present

## 2017-01-07 DIAGNOSIS — S8410XS Injury of peroneal nerve at lower leg level, unspecified leg, sequela: Secondary | ICD-10-CM | POA: Diagnosis not present

## 2017-01-07 DIAGNOSIS — G8929 Other chronic pain: Secondary | ICD-10-CM

## 2017-01-07 MED ORDER — HYDROCODONE-ACETAMINOPHEN 10-325 MG PO TABS: 1.0000 | ORAL_TABLET | Freq: Four times a day (QID) | ORAL | 0 refills | Status: DC | PRN
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
## 2017-01-15 ENCOUNTER — Ambulatory Visit (INDEPENDENT_AMBULATORY_CARE_PROVIDER_SITE_OTHER): Payer: Medicare Other | Admitting: *Deleted

## 2017-01-15 DIAGNOSIS — J309 Allergic rhinitis, unspecified: Secondary | ICD-10-CM | POA: Diagnosis not present

## 2017-01-16 ENCOUNTER — Ambulatory Visit: Payer: Medicare Other | Admitting: Allergy

## 2017-01-22 ENCOUNTER — Ambulatory Visit (INDEPENDENT_AMBULATORY_CARE_PROVIDER_SITE_OTHER): Payer: Medicare Other | Admitting: *Deleted

## 2017-01-22 DIAGNOSIS — J309 Allergic rhinitis, unspecified: Secondary | ICD-10-CM

## 2017-01-29 ENCOUNTER — Ambulatory Visit (INDEPENDENT_AMBULATORY_CARE_PROVIDER_SITE_OTHER): Payer: Medicare Other | Admitting: *Deleted

## 2017-01-29 DIAGNOSIS — J309 Allergic rhinitis, unspecified: Secondary | ICD-10-CM

## 2017-02-05 ENCOUNTER — Ambulatory Visit (INDEPENDENT_AMBULATORY_CARE_PROVIDER_SITE_OTHER): Payer: Medicare Other | Admitting: *Deleted

## 2017-02-05 DIAGNOSIS — J309 Allergic rhinitis, unspecified: Secondary | ICD-10-CM | POA: Diagnosis not present

## 2017-02-09 ENCOUNTER — Encounter: Payer: Self-pay | Admitting: Registered Nurse

## 2017-02-09 ENCOUNTER — Other Ambulatory Visit: Payer: Self-pay | Admitting: Physical Medicine & Rehabilitation

## 2017-02-09 ENCOUNTER — Encounter: Payer: Medicare Other | Attending: Physical Medicine & Rehabilitation | Admitting: Registered Nurse

## 2017-02-09 VITALS — BP 138/80 | HR 83

## 2017-02-09 DIAGNOSIS — Z5181 Encounter for therapeutic drug level monitoring: Secondary | ICD-10-CM | POA: Diagnosis present

## 2017-02-09 DIAGNOSIS — G894 Chronic pain syndrome: Secondary | ICD-10-CM

## 2017-02-09 DIAGNOSIS — M549 Dorsalgia, unspecified: Secondary | ICD-10-CM | POA: Diagnosis not present

## 2017-02-09 DIAGNOSIS — M25511 Pain in right shoulder: Secondary | ICD-10-CM | POA: Diagnosis not present

## 2017-02-09 DIAGNOSIS — G609 Hereditary and idiopathic neuropathy, unspecified: Secondary | ICD-10-CM

## 2017-02-09 DIAGNOSIS — Z79899 Other long term (current) drug therapy: Secondary | ICD-10-CM | POA: Diagnosis present

## 2017-02-09 DIAGNOSIS — S8410XS Injury of peroneal nerve at lower leg level, unspecified leg, sequela: Secondary | ICD-10-CM | POA: Diagnosis not present

## 2017-02-09 DIAGNOSIS — G8929 Other chronic pain: Secondary | ICD-10-CM

## 2017-02-09 DIAGNOSIS — M25512 Pain in left shoulder: Secondary | ICD-10-CM

## 2017-02-09 DIAGNOSIS — M47817 Spondylosis without myelopathy or radiculopathy, lumbosacral region: Secondary | ICD-10-CM | POA: Diagnosis not present

## 2017-02-09 MED ORDER — HYDROCODONE-ACETAMINOPHEN 10-325 MG PO TABS: 1.0000 | ORAL_TABLET | Freq: Four times a day (QID) | ORAL | 0 refills | Status: DC | PRN
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
## 2017-02-12 ENCOUNTER — Ambulatory Visit (INDEPENDENT_AMBULATORY_CARE_PROVIDER_SITE_OTHER): Payer: Medicare Other

## 2017-02-12 DIAGNOSIS — J309 Allergic rhinitis, unspecified: Secondary | ICD-10-CM

## 2017-02-19 ENCOUNTER — Ambulatory Visit (INDEPENDENT_AMBULATORY_CARE_PROVIDER_SITE_OTHER): Payer: Medicare Other | Admitting: *Deleted

## 2017-02-19 DIAGNOSIS — J309 Allergic rhinitis, unspecified: Secondary | ICD-10-CM | POA: Diagnosis not present

## 2017-02-26 ENCOUNTER — Ambulatory Visit (INDEPENDENT_AMBULATORY_CARE_PROVIDER_SITE_OTHER): Payer: Medicare Other | Admitting: Allergy

## 2017-02-26 ENCOUNTER — Encounter: Payer: Self-pay | Admitting: Allergy

## 2017-02-26 VITALS — BP 122/82 | HR 70 | Resp 18

## 2017-02-26 DIAGNOSIS — R21 Rash and other nonspecific skin eruption: Secondary | ICD-10-CM | POA: Diagnosis not present

## 2017-02-26 DIAGNOSIS — J4540 Moderate persistent asthma, uncomplicated: Secondary | ICD-10-CM | POA: Diagnosis not present

## 2017-02-26 DIAGNOSIS — J3089 Other allergic rhinitis: Secondary | ICD-10-CM

## 2017-02-26 NOTE — Progress Notes (Signed)
Follow-up Note  RE: Kristopher Gay MRN: [id number] DOB: 22-Oct-1951 Date of Office Visit: 02/26/2017   History of present illness: Kristopher Gay is a 66 y.o. male presenting today for follow-up of allergic rhinitis. He also has a history of asthma and is followed by Dr. Annamaria Boots.   He was last seen on 10/16/16 by myself.  He is on allergen immunotherapy that he tolerates well without any local or systemic reactions. She has access to an EpiPen. His biggest concern today is that he has been having a "urological rash" that he has seen a urologist for. He states he has had urine testing as well as blood testing it was recommended to use a topical cream afor this rash. The rash is not improved. He is concerned that the rash might be related to Singulair.  He started the Singulair in December and the rash started in April.  He has been off singulair for the past month and rash has not improved.   He also ask if the rash could be related to his allergy shots.   He is planning to get a second opinion with another urologist in the next coming weeks.   He does state that his asthma symptoms are a bit worse since he is off of Singulair. He is having more cough but does not feel the need to use his albuterol. He continues on Advair. He does also take Allegra and Flonase daily.    Review of systems: Review of Systems  Constitutional: Negative for chills, fever and malaise/fatigue.  HENT: Positive for congestion. Negative for ear discharge, ear pain, nosebleeds, sinus pain, sore throat and tinnitus.   Eyes: Negative for discharge and redness.  Respiratory: Positive for cough. Negative for shortness of breath and wheezing.   Cardiovascular: Negative for chest pain.  Gastrointestinal: Negative for abdominal pain, heartburn, nausea and vomiting.  Genitourinary: Negative for dysuria.  Musculoskeletal: Negative for joint pain and myalgias.  Skin: Positive for itching and rash.  Neurological: Negative for  headaches.    All other systems negative unless noted above in HPI  Past medical/social/surgical/family history have been reviewed and are unchanged unless specifically indicated below.  No changes  Medication List: Allergies as of 02/26/2017      Reactions   Bee Venom Swelling   Molds & Smuts    Seasonal Ic [cholestatin]    Environmental Allergies      Medication List       Accurate as of 02/26/17  5:37 PM. Always use your most recent med list.          ADVAIR DISKUS 500-50 MCG/DOSE Aepb Generic drug:  Fluticasone-Salmeterol INHALE 1 PUFF BY MOUTH TWICE DAILY, THEN RINSE MOUTH   ascorbic acid 1000 MG tablet Commonly known as:  VITAMIN C Take 1,000 mg by mouth daily. As needed   celecoxib 200 MG capsule Commonly known as:  CELEBREX TAKE 1 CAPSULE(200 MG) BY MOUTH DAILY   DHEA 25 MG Caps Take 1 capsule by mouth daily. Reported on 12/19/2015   EPIPEN 2-PAK 0.3 mg/0.3 mL Soaj injection Generic drug:  EPINEPHrine Inject 0.3 mg into the muscle once. Reported on 12/13/2015   Fish Oil 1000 MG Caps Take 1 capsule by mouth daily. Reported on 12/19/2015   fluticasone 50 MCG/ACT nasal spray Commonly known as:  FLONASE Place 1-2 sprays into both nostrils 2 (two) times daily as needed for allergies or rhinitis.   gabapentin 300 MG capsule Commonly known as:  NEURONTIN TAKE 2  CAPSULES BY MOUTH EVERY MORNING, 1 CAPSULE EVERY AFTERNOON AND 2 CAPSULES EVERY NIGHT AT BEDTIME   GLUCOSAMINE 1500 COMPLEX Caps Take 1 capsule by mouth daily. Reported on 12/19/2015   HYDROcodone-acetaminophen 10-325 MG tablet Commonly known as:  NORCO Take 1 tablet by mouth every 6 (six) hours as needed.   multivitamin tablet Take 1 tablet by mouth daily. Reported on 12/19/2015   nystatin-triamcinolone cream Commonly known as:  MYCOLOG II APP EXT AA BID IN THE MORNING AND IN THE EVE   pantoprazole 40 MG tablet Commonly known as:  PROTONIX Take 1 tablet 30 minutes prior to breakfast.     PATANASE 0.6 % Soln Generic drug:  Olopatadine HCl Place 1-2 drops into the nose 2 (two) times daily.   PROVENTIL HFA 108 (90 Base) MCG/ACT inhaler Generic drug:  albuterol Inhale into the lungs every 6 (six) hours as needed for wheezing or shortness of breath.   Vitamin D3 5000 units Caps Take by mouth.   vitamin E 400 UNIT capsule Generic drug:  vitamin E Take 400 Units by mouth 2 (two) times daily. Reported on 12/19/2015       Known medication allergies: Allergies  Allergen Reactions  . Bee Venom Swelling  . Molds & Smuts   . Seasonal Ic [Cholestatin]     Environmental Allergies     Physical examination: Blood pressure 122/82, pulse 70, resp. rate 18, SpO2 95 %.  General: Alert, interactive, in no acute distress. HEENT: TMs pearly gray, turbinates mildly edematous without discharge, post-pharynx non erythematous. Neck: Supple without lymphadenopathy. Lungs: Clear to auscultation without wheezing, rhonchi or rales. {no increased work of breathing. CV: Normal S1, S2 without murmurs. Abdomen: Nondistended, nontender. Skin: Warm and dry, without lesions or rashes. GU exam deferred Extremities:  No clubbing, cyanosis or edema. Neuro:   Grossly intact.  Diagnositics/Labs: None today  Assessment and plan:   Allergic rhinitis - continue allergen immunotherapy (allergy shots) - ok to stay off of Singulair until you have your second opinion with urologist regarding rash.    I do not feel your rash is drug rash related to Singulair or allergy shots as rash has not improved with discontinuation of singulair.   Recommended he resume Singulair as he has improvement in his asthma with use however he reports he is nervous with resuming and would like to have second opinion regarding his rash before resuming.   - continue your Allegra 180mg  daily, Flonase 2 sprays each nostril daily  Asthma - he is followed by Dr. Annamaria Boots with upcoming appointment - continue Advair and as needed  albuterol and follow-up with Dr. Annamaria Boots for your asthma care  Rash  - as above do not feel his rash is related to singulair use or allergen immunotherapy  - can increase Allegra to twice a day to better itch control.   - defer to urology for topical treatment options.    Follow-up 6 months or sooner if needed  I appreciate the opportunity to take part in Weslie's care. Please do not hesitate to contact me with questions.  Sincerely,   Prudy Feeler, MD Allergy/Immunology [facility]

## 2017-02-26 NOTE — Patient Instructions (Signed)
-   continue allergen immunotherapy (allergy shot)  - ok to stay off of Singulair until you have your second opinion with urologist regarding rash.    I do not feel your rash is drug rash related to Singulair or allergy shots.    - continue your Allegra 180mg  daily, Flonase 2 sprays each nostril daily  - continue Advair and as needed albuterol and follow-up with Dr. Annamaria Boots for your asthma care   Follow-up 6 months or sooner if needed

## 2017-03-05 ENCOUNTER — Ambulatory Visit (INDEPENDENT_AMBULATORY_CARE_PROVIDER_SITE_OTHER): Payer: Medicare Other | Admitting: *Deleted

## 2017-03-05 DIAGNOSIS — J309 Allergic rhinitis, unspecified: Secondary | ICD-10-CM

## 2017-03-11 ENCOUNTER — Ambulatory Visit (INDEPENDENT_AMBULATORY_CARE_PROVIDER_SITE_OTHER): Payer: Medicare Other

## 2017-03-11 ENCOUNTER — Encounter: Payer: Self-pay | Admitting: Registered Nurse

## 2017-03-11 ENCOUNTER — Encounter: Payer: Medicare Other | Attending: Physical Medicine & Rehabilitation | Admitting: Registered Nurse

## 2017-03-11 ENCOUNTER — Telehealth: Payer: Self-pay | Admitting: Registered Nurse

## 2017-03-11 VITALS — BP 146/79 | HR 74

## 2017-03-11 DIAGNOSIS — M549 Dorsalgia, unspecified: Secondary | ICD-10-CM

## 2017-03-11 DIAGNOSIS — G8929 Other chronic pain: Secondary | ICD-10-CM

## 2017-03-11 DIAGNOSIS — S8410XS Injury of peroneal nerve at lower leg level, unspecified leg, sequela: Secondary | ICD-10-CM

## 2017-03-11 DIAGNOSIS — M25511 Pain in right shoulder: Secondary | ICD-10-CM

## 2017-03-11 DIAGNOSIS — M25512 Pain in left shoulder: Secondary | ICD-10-CM

## 2017-03-11 DIAGNOSIS — M25562 Pain in left knee: Secondary | ICD-10-CM

## 2017-03-11 DIAGNOSIS — Z79899 Other long term (current) drug therapy: Secondary | ICD-10-CM | POA: Diagnosis present

## 2017-03-11 DIAGNOSIS — Z5181 Encounter for therapeutic drug level monitoring: Secondary | ICD-10-CM | POA: Diagnosis not present

## 2017-03-11 DIAGNOSIS — M47817 Spondylosis without myelopathy or radiculopathy, lumbosacral region: Secondary | ICD-10-CM | POA: Diagnosis not present

## 2017-03-11 DIAGNOSIS — G609 Hereditary and idiopathic neuropathy, unspecified: Secondary | ICD-10-CM

## 2017-03-11 DIAGNOSIS — G894 Chronic pain syndrome: Secondary | ICD-10-CM

## 2017-03-11 DIAGNOSIS — M25561 Pain in right knee: Secondary | ICD-10-CM

## 2017-03-11 DIAGNOSIS — J309 Allergic rhinitis, unspecified: Secondary | ICD-10-CM | POA: Diagnosis not present

## 2017-03-11 MED ORDER — HYDROCODONE-ACETAMINOPHEN 10-325 MG PO TABS: 1.0000 | ORAL_TABLET | Freq: Four times a day (QID) | ORAL | 0 refills | Status: DC | PRN
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
## 2017-03-11 NOTE — Telephone Encounter (Signed)
On 03/11/2017 the  [place] was reviewed no conflict was seen on the [place] with multiple prescribers. Kristopher Gay has a signed narcotic contract with our office. If there were any discrepancies this would have been reported to his physician.

## 2017-03-17 ENCOUNTER — Telehealth: Payer: Self-pay | Admitting: *Deleted

## 2017-03-17 LAB — TOXASSURE SELECT,+ANTIDEPR,UR

## 2017-03-17 NOTE — Telephone Encounter (Signed)
Urine drug screen for this encounter is consistent for prescribed medication 

## 2017-03-19 ENCOUNTER — Ambulatory Visit (INDEPENDENT_AMBULATORY_CARE_PROVIDER_SITE_OTHER): Payer: Medicare Other | Admitting: *Deleted

## 2017-03-19 DIAGNOSIS — J309 Allergic rhinitis, unspecified: Secondary | ICD-10-CM

## 2017-03-20 ENCOUNTER — Ambulatory Visit (INDEPENDENT_AMBULATORY_CARE_PROVIDER_SITE_OTHER): Payer: Medicare Other | Admitting: Internal Medicine

## 2017-03-20 ENCOUNTER — Telehealth: Payer: Self-pay | Admitting: Gastroenterology

## 2017-03-20 ENCOUNTER — Telehealth: Payer: Self-pay | Admitting: Family Medicine

## 2017-03-20 ENCOUNTER — Encounter: Payer: Self-pay | Admitting: Internal Medicine

## 2017-03-20 DIAGNOSIS — K219 Gastro-esophageal reflux disease without esophagitis: Secondary | ICD-10-CM

## 2017-03-20 DIAGNOSIS — J449 Chronic obstructive pulmonary disease, unspecified: Secondary | ICD-10-CM | POA: Diagnosis not present

## 2017-03-20 DIAGNOSIS — J302 Other seasonal allergic rhinitis: Secondary | ICD-10-CM

## 2017-03-20 DIAGNOSIS — J3089 Other allergic rhinitis: Secondary | ICD-10-CM

## 2017-03-20 NOTE — Telephone Encounter (Signed)
Spoke to patient, let him know that he either needs to contact his PCP or the MD that ordered the CT and he will need to be referred to a general surgeon to repair the umbilical hernia. Patient was concerned about blockages, he did state that he did have a bm today. Tried to reassure patient that his bowels are working. Patient states that his pulmonologist told him to contact our office.

## 2017-03-20 NOTE — Telephone Encounter (Signed)
Pt states [facility] found hernia and was told to follow up with you his family doctor for recommendations, said they send records to you.  Wants to know what he should do now?

## 2017-03-20 NOTE — Patient Instructions (Signed)
Ok to continue present meds  Please call as needed 

## 2017-03-20 NOTE — Telephone Encounter (Signed)
Left message for patient that he needs to call back. Our physicians do not repair hernia's. He needs to contact his doctor who ordered the CT and will need to be referred to a general surgeon not a gastroenterologist. Appointment cancelled for here.

## 2017-03-20 NOTE — Progress Notes (Signed)
---------------  Patient ID: Kristopher Gay, male    DOB: [birth year], 66 y.o.   MRN: [id number]  HPI  M never smoker followed for allergic rhinitis and allergic asthma/ bronchitis, complicated by chronic musculoskeletal pain after an injury, GERD/ reflux Barium Swallow 04/04/14.2 Moderate gastroesophageal reflux to the level of mid esophagus CT chest 01/11/2016-IMPRESSION: 1. 2.2 cm left adrenal adenoma.ower lobe  2. New bronchial wall thickening and nodular opacities in the right likely reflecting bronchiolitis Office Spirometry 09/23/2016-mild restriction of exhaled volume. FVC 3.75/71%, FEV1 3.01/76%, ratio 0.80, FEF 25-75% 2.96/95%. ------------------------------------------------------------------------------------------------------------------- 09/23/2016-66 year old male never smoker followed for Allergic rhinitis, allergic asthma/bronchitis, complicated by chronic musculoskeletal pain after injury, GERD/reflux, grade 1 diastolic dysfunction, degenerative disc disease Allergy Vaccine 1:10 GH FOLLOWS FOR: Still on vaccine and no reactions. Wheezing, cough-productive-gray in color. Pt states he has noticed his O2 levels have dropped lower than usual-stays above 90%. He routinely expects to have some cough and wheeze antibiotic after last visit helped for about 2 weeks that he began coughing again and repeat treatment did not make a difference. Usually nonproductive. No fever or sweat. CT chest 05/22/2016 On lung window image, much of the peribronchial vascular nodularity noted vertical in the right lower lobe on the prior CT has cleared. Linear scarring and/or atelectasis remains in both lower lobes posteriorly. No suspicious lung nodule is seen. No parenchymal infiltrate is seen. There is no evidence of pleural effusion. The central airway is patent. The thoracic vertebrae are in normal alignment with degenerative change diffusely. No compression deformity is seen. On soft tissue window  images, the thyroid gland is unremarkable. On this unenhanced study, only small mediastinal lymph nodes are present. The mid ascending thoracic aorta measures 38 mm in Diameter. Office Spirometry 09/23/2016-mild restriction of exhaled volume. FVC 3.75/71%, FEV1 3.01/76%, ratio 0.80, FEF 25-75% 2.96/95%.  03/20/17-66 year old male never smoker followed for Allergic rhinitis, allergic asthma/bronchitis, complicated by chronic musculoskeletal pain after injury, GERD/reflux, grade 1 diastolic dysfunction, degenerative disc disease FOLLOWS FOR:Pt states he was stopped on allergy vaccine and retested-now is on dust mites and mold vaccine only( Dr Neldon Mc) . Pt is having sob and cough as well. Pt was put on Singulair 10mg  qd since December-stopped due to side effects. Allergy Profile 10/16/16- Neg-no elevation for local environmental allergens Questionable neurologic problem related to Singulair so he stopped it. Some wheeze and frequent nonproductive cough. Continues Advair 500, occasional rescue. He complains of an umbilical hernia and I suggested he revisit GI.  ROS-see HPI Constitutional:   No-   weight loss, night sweats, fevers, chills, fatigue, lassitude. HEENT:   No-  headaches, difficulty swallowing, tooth/dental problems, +sore throat,       +sneezing, itching, ear ache, + nasal congestion, +post nasal drip,  CV:  +chest pain, no-orthopnea, PND, swelling in lower extremities, anasarca,  dizziness, palpitations Resp: No-   shortness of breath with exertion or at rest.             + productive cough,  + non-productive cough,  No- coughing up of blood.              No-   change in color of mucus.  +wheezing.   Skin: No-   rash or lesions. GI:  No-   heartburn, indigestion, abdominal pain, nausea, vomiting,  GU: . MS: +  joint pain or swelling.  No- decreased range of motion.  + back pain. Neuro-     nothing unusual Psych:  No- change in mood or affect. +  depression or anxiety.  No memory  loss.  OBJ- Physical Exam General- Alert, Oriented, Affect-appropriate, Distress- none acute. Big/ muscular Skin- rash-none, lesions- none, excoriation- none Lymphadenopathy- none Head- atraumatic            Eyes- Gross vision intact, PERRLA, conjunctivae and secretions clear            Ears- Hearing, canals-normal            Nose- Clear, no-Septal dev, mucus, polyps, erosion, perforation             Throat- Mallampati II , mucosa clear(throat lozenges) , drainage- none, tonsils- atrophic Neck- flexible , trachea midline, no stridor , thyroid nl, carotid no bruit Chest - symmetrical excursion , unlabored           Heart/CV- RRR , no murmur , no gallop  , no rub, nl s1 s2                           - JVD- none , edema- none, stasis changes- none, varices- none           Lung- + clear , dullness-none, rub- none, cough-none           Chest wall- not obviously tender Abd-  Br/ Gen/ Rectal- Not done, not indicated Extrem- cyanosis- none, clubbing, none, atrophy- none, strength- muscular. + Brace L lower leg, + cane Neuro- grossly intact to observation

## 2017-03-21 NOTE — Assessment & Plan Note (Signed)
Known reflux with potential to aggravate his chronic cough. Importance of maintaining strict reflux precautions was emphasized.

## 2017-03-21 NOTE — Assessment & Plan Note (Signed)
Has not been retested and restarted on allergy vaccine by his allergy office.

## 2017-03-21 NOTE — Assessment & Plan Note (Signed)
Control is adequate. Urologic symptoms or not a common complaint with Singulair so I suggested that he could retry it if he wanted. He felt Singulair was helpful for his breathing. Total IgE and eosinophil counts can be reassessed as appropriate. For now I don't think he is a candidate for Xolair or other biologic therapy.

## 2017-03-23 NOTE — Telephone Encounter (Signed)
It looks like he switched to Dr Brigitte Pulse

## 2017-03-24 NOTE — Telephone Encounter (Signed)
Pt will come in Wednesday afternoon for visit.

## 2017-03-25 ENCOUNTER — Ambulatory Visit (INDEPENDENT_AMBULATORY_CARE_PROVIDER_SITE_OTHER): Payer: Medicare Other | Admitting: Family Medicine

## 2017-03-25 ENCOUNTER — Ambulatory Visit
Admission: RE | Admit: 2017-03-25 | Discharge: 2017-03-25 | Disposition: A | Discharge status: Home / Self Care | Payer: Medicare Other | Source: Ambulatory Visit | Attending: Family Medicine | Admitting: Family Medicine

## 2017-03-25 ENCOUNTER — Encounter: Payer: Self-pay | Admitting: Family Medicine

## 2017-03-25 VITALS — BP 140/92 | HR 77 | Ht 75.0 in | Wt 242.0 lb

## 2017-03-25 DIAGNOSIS — K5903 Drug induced constipation: Secondary | ICD-10-CM

## 2017-03-25 NOTE — Progress Notes (Signed)
   Subjective:    Patient ID: Kristopher Gay, male    DOB: 1951/07/12, 66 y.o.   MRN: [id number]  HPI He is here for consult concerning constipation and hernia. He apparently has had difficulty dealing with constipation to the point where he had have a cleanout due to constipation. Since then he has been using MiraLAX but states that he is now having loose watery stools and is concerned about possible fecalith. Also he had a recent CT scan which did show evidence of bilateral fat filled inguinal hernias and thinks this might be related to his distress. He also has a previous history of chronic pain medication use and has been on codeine for quite some time.  Review of Systems     Objective:   Physical Exam Alert and in no distress otherwise not examined. I did explain that his stool pattern could possibly be related to that and will get an x-ray concerning that.       Assessment & Plan:  Drug-induced constipation - Plan: DG Abd 1 View  The x-ray showed no excessive amount of stool. I explained this to him and recommended that he actually cut back on his MiraLAX to half dosing to see if this will give him more formed stool as opposed to loose stools. Also explained the fact that his hernias are not causing any obstruction symptoms and at this point no further intervention will be needed concerning that. It was difficult to get my point request to him as he seemed to avoid he made his mind up is to exactly what was going on. Did encourage him to call me back if he has further concerns.AAAAAAAAAAAAAAAAAAAAAAAAAAAAAAAAAAAAAAAAAAAAAAAAAAAAAAAAAAAAAAAAAAAAAAAA[id number]

## 2017-03-26 ENCOUNTER — Ambulatory Visit (INDEPENDENT_AMBULATORY_CARE_PROVIDER_SITE_OTHER): Payer: Medicare Other | Admitting: *Deleted

## 2017-03-26 DIAGNOSIS — J309 Allergic rhinitis, unspecified: Secondary | ICD-10-CM | POA: Diagnosis not present

## 2017-04-01 ENCOUNTER — Encounter: Payer: Self-pay | Admitting: Family Medicine

## 2017-04-01 ENCOUNTER — Ambulatory Visit: Payer: Medicare Other | Admitting: Gastroenterology

## 2017-04-01 ENCOUNTER — Telehealth: Payer: Self-pay | Admitting: Family Medicine

## 2017-04-01 NOTE — Telephone Encounter (Signed)
Called pt to follow up on him per Dr. Redmond School, pt states he's still not doing good, GI issues maybe little better, still don't feel normal, states stomach churning, has heaviness.  Please advise.

## 2017-04-02 ENCOUNTER — Ambulatory Visit (INDEPENDENT_AMBULATORY_CARE_PROVIDER_SITE_OTHER): Payer: Medicare Other | Admitting: *Deleted

## 2017-04-02 ENCOUNTER — Telehealth: Payer: Self-pay | Admitting: Family Medicine

## 2017-04-02 ENCOUNTER — Other Ambulatory Visit: Payer: Self-pay

## 2017-04-02 DIAGNOSIS — J309 Allergic rhinitis, unspecified: Secondary | ICD-10-CM

## 2017-04-02 DIAGNOSIS — K5903 Drug induced constipation: Secondary | ICD-10-CM

## 2017-04-02 NOTE — Telephone Encounter (Signed)
Pt left me a message on voice mail that he wanted to talk with me regarding the phone call he received today.  Called pt back [phone] reached his voice mail.  Advised I would try him again Monday, if urgent call back tomorrow and speak with Cheri his CMA.

## 2017-04-02 NOTE — Telephone Encounter (Signed)
Let him know that I think it would best to see GI again

## 2017-04-02 NOTE — Telephone Encounter (Signed)
Left word to word message on pt voicemail

## 2017-04-03 NOTE — Telephone Encounter (Signed)
Patient had called earlier yesterday to get advise from [place] about his stomach I called pt left him a message Dr.Lalonde wanted him to go back and see his GI. He did call me back and informed me he had to have a referral back to GI I told him no problem I would take care of it and they will call him to give him an appointment so I dont know if this is between the calls we had or not

## 2017-04-09 ENCOUNTER — Ambulatory Visit (INDEPENDENT_AMBULATORY_CARE_PROVIDER_SITE_OTHER): Payer: Medicare Other | Admitting: *Deleted

## 2017-04-09 DIAGNOSIS — J309 Allergic rhinitis, unspecified: Secondary | ICD-10-CM

## 2017-04-13 ENCOUNTER — Encounter: Payer: Medicare Other | Attending: Physical Medicine & Rehabilitation

## 2017-04-13 ENCOUNTER — Encounter: Payer: Self-pay | Admitting: Physical Medicine & Rehabilitation

## 2017-04-13 ENCOUNTER — Ambulatory Visit (HOSPITAL_BASED_OUTPATIENT_CLINIC_OR_DEPARTMENT_OTHER): Payer: Medicare Other | Admitting: Physical Medicine & Rehabilitation

## 2017-04-13 VITALS — BP 136/79 | HR 67

## 2017-04-13 DIAGNOSIS — Z79899 Other long term (current) drug therapy: Secondary | ICD-10-CM | POA: Diagnosis present

## 2017-04-13 DIAGNOSIS — Z5181 Encounter for therapeutic drug level monitoring: Secondary | ICD-10-CM | POA: Insufficient documentation

## 2017-04-13 DIAGNOSIS — M47817 Spondylosis without myelopathy or radiculopathy, lumbosacral region: Secondary | ICD-10-CM

## 2017-04-13 MED ORDER — GABAPENTIN 300 MG PO CAPS: ORAL_CAPSULE | ORAL | 2 refills | Status: DC

## 2017-04-13 MED ORDER — CELECOXIB 200 MG PO CAPS: ORAL_CAPSULE | ORAL | 2 refills | Status: DC

## 2017-04-13 NOTE — Patient Instructions (Signed)
Restart Gabapentin  Tramadol may be good option for less constipating opiate
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
## 2017-04-16 ENCOUNTER — Ambulatory Visit (INDEPENDENT_AMBULATORY_CARE_PROVIDER_SITE_OTHER): Payer: Medicare Other

## 2017-04-16 DIAGNOSIS — J309 Allergic rhinitis, unspecified: Secondary | ICD-10-CM

## 2017-04-21 ENCOUNTER — Encounter: Payer: Self-pay | Admitting: Gastroenterology

## 2017-04-21 ENCOUNTER — Ambulatory Visit (INDEPENDENT_AMBULATORY_CARE_PROVIDER_SITE_OTHER): Payer: Medicare Other | Admitting: Gastroenterology

## 2017-04-21 VITALS — BP 134/80 | HR 68 | Ht 73.0 in | Wt 245.1 lb

## 2017-04-21 DIAGNOSIS — K439 Ventral hernia without obstruction or gangrene: Secondary | ICD-10-CM | POA: Diagnosis not present

## 2017-04-21 DIAGNOSIS — K5900 Constipation, unspecified: Secondary | ICD-10-CM

## 2017-04-21 DIAGNOSIS — R1084 Generalized abdominal pain: Secondary | ICD-10-CM | POA: Diagnosis not present

## 2017-04-21 NOTE — Progress Notes (Addendum)
[facility] Progress Note  Chief Complaint: Constipation and abdominal pain  Subjective  History:  This is a 66 year old man I last saw in March 2017. At that time he had a prolonged episode of abdominal pain after acute severe constipation that he attributed to some medicines after surgery. He reports that he was not troubled with chronic constipation after that until about 2 months ago. He is a somewhat tangential historian. Kristopher Gay describes having some pelvic pain that he thought was urologic in nature. He saw to local urologist, and the second one apparently did a CT scan of the abdomen and pelvis without contrast in mid May. This was unrevealing for source of urologic problems, and it showed a large ventral hernia with an adjacent loop of colon. Kristopher Gay reports taking MiraLAX over the last couple of months at still has feelings of incomplete evacuation. He saw primary care who felt the hernia did not need surgical intervention, but he is still concerned about the symptoms. He wants to know why he has constipation and why he cannot have surgery for the hernia. Kristopher Gay denies rectal bleeding. He declined to have a colonoscopy when I saw him last year, and he still does not want to have one at this point. He feels "I'm having so much trouble, I think that would just make my system worse".  ROS: Cardiovascular:  no chest pain Respiratory: no dyspnea He has chronic back pain, and feels that this hernia with the pain is causing is keeping him from doing certain abdominal exercises he needs to strengthen his back.  The patient's Past Medical, Family and Social History were reviewed and are on file in the EMR.  Objective:  Med list reviewed  Vital signs in last 24 hrs: Vitals:   04/21/17 1556  BP: 134/80  Pulse: 68    Physical Exam  Antalgic gait. He is well-appearing somewhat restricted affect, no acute distress and good muscle mass  HEENT: sclera anicteric, oral mucosa moist  without lesions  Neck: supple, no thyromegaly, JVD or lymphadenopathy  Cardiac: RRR without murmurs, S1S2 heard, no peripheral edema  Pulm: clear to auscultation bilaterally, normal RR and effort noted  Abdomen: soft, mild tenderness over the midline, with active bowel sounds. No guarding or palpable hepatosplenomegaly. Hernia becomes apparent when he sits up somewhat.  Skin; warm and dry, no jaundice or rash  Radiologic studies:  Noncontrast CT scan abdomen and pelvis reported as noted above. Report is available but the images cannot be reviewed because it was done at outside imaging center.  @ASSESSMENTPLANBEGIN @ Assessment: Encounter Diagnoses  Name Primary?  . Constipation, unspecified constipation type Yes  . Ventral hernia without obstruction or gangrene   . Generalized abdominal pain    Recent onset constipation. It does not seem likely to be obstructive given this recent CT scan findings, although no oral contrast was used. There is no evidence of colonic dilatation. I again offered him a colonoscopy to rule out obstruction or other neoplastic and serious pathology. He again declines a colonoscopy for the reasons mentioned above. He asked for some help relieving the constipation, and I gave him samples of Linzess 145 mg once daily for the next 10-12 days. I have told him that loose stool tends to be the most common side effect.  [name] felt strongly about a surgical opinion regarding this hernia. He believes that the hernia is the cause of the constipation, though I have told him I doubt that to be true. Per  his request, I referred him to the local surgical practice for evaluation. I strongly suggested that he go to the imaging center with a recent CT was done and get a copy of that study on a disc to bring with him to the surgical appointment.  If he see changes mind regarding a colonoscopy, orifice surgeon should require it prior to considerations of hernia surgery, I will gladly  revisit that.  Total time 30 minutes, over half spent in counseling and coordination of care.   Kristopher Gay III   CC: Jill Alexanders, MD

## 2017-04-21 NOTE — Patient Instructions (Signed)
If you are age 66 or older, your body mass index should be between 23-30. Your Body mass index is 32.34 kg/m. If this is out of the aforementioned range listed, please consider follow up with your Primary Care Provider.  If you are age 11 or younger, your body mass index should be between 19-25. Your Body mass index is 32.34 kg/m. If this is out of the aformentioned range listed, please consider follow up with your Primary Care Provider.   Samples of this drug were given to the patient, quantity 12 capsules, Lot Number [id number], exp 4/19:  Linzess, 173mcg  We will send your records to [facility].  If you do not hear from them within 2 weeks please give [place] a call at [phone].  Thank you for choosing [facility]  Dr Wilfrid Lund III

## 2017-04-22 ENCOUNTER — Telehealth: Payer: Self-pay

## 2017-04-22 NOTE — Telephone Encounter (Signed)
[facility] appointment has been scheduled for 04-30-2017 @ 345pm with Dr Kieth Brightly.

## 2017-04-22 NOTE — Telephone Encounter (Signed)
Records have been faxed to [facility] for a NP referral for ventral hernia. Will await for appointment info.

## 2017-04-23 ENCOUNTER — Ambulatory Visit (INDEPENDENT_AMBULATORY_CARE_PROVIDER_SITE_OTHER): Payer: Medicare Other | Admitting: *Deleted

## 2017-04-23 DIAGNOSIS — J309 Allergic rhinitis, unspecified: Secondary | ICD-10-CM

## 2017-04-27 ENCOUNTER — Telehealth: Payer: Self-pay | Admitting: Physical Medicine & Rehabilitation

## 2017-04-27 NOTE — Telephone Encounter (Signed)
[place] TO CLEAR TO BE NSF

## 2017-04-30 ENCOUNTER — Ambulatory Visit (INDEPENDENT_AMBULATORY_CARE_PROVIDER_SITE_OTHER): Payer: Medicare Other | Admitting: *Deleted

## 2017-04-30 DIAGNOSIS — J309 Allergic rhinitis, unspecified: Secondary | ICD-10-CM

## 2017-05-01 ENCOUNTER — Telehealth: Payer: Self-pay | Admitting: Gastroenterology

## 2017-05-01 MED ORDER — LINACLOTIDE 145 MCG PO CAPS: 145.0000 ug | ORAL_CAPSULE | Freq: Every day | ORAL | 3 refills | Status: DC

## 2017-05-01 NOTE — Telephone Encounter (Signed)
Request for Linzess 145 mcg one a day. Pt states the samples are working well. Would like RX.

## 2017-05-07 ENCOUNTER — Ambulatory Visit (INDEPENDENT_AMBULATORY_CARE_PROVIDER_SITE_OTHER): Payer: Medicare Other | Admitting: *Deleted

## 2017-05-07 DIAGNOSIS — J309 Allergic rhinitis, unspecified: Secondary | ICD-10-CM

## 2017-05-11 ENCOUNTER — Telehealth: Payer: Self-pay | Admitting: Gastroenterology

## 2017-05-11 NOTE — Telephone Encounter (Signed)
I rec'd the consult note from the surgeon.  Seems he recommended a colonoscopy prior to considering any hernia surgery. Kristopher Gay was not interested in a colonoscopy when I last saw him.  I am making the offer again if he has changed his mind.  Please let me know what he thinks.

## 2017-05-14 ENCOUNTER — Ambulatory Visit (INDEPENDENT_AMBULATORY_CARE_PROVIDER_SITE_OTHER): Payer: Medicare Other | Admitting: *Deleted

## 2017-05-14 DIAGNOSIS — J309 Allergic rhinitis, unspecified: Secondary | ICD-10-CM | POA: Diagnosis not present

## 2017-05-14 NOTE — Telephone Encounter (Signed)
Left a message to return call.  

## 2017-05-21 ENCOUNTER — Ambulatory Visit (INDEPENDENT_AMBULATORY_CARE_PROVIDER_SITE_OTHER): Payer: Medicare Other | Admitting: *Deleted

## 2017-05-21 DIAGNOSIS — J309 Allergic rhinitis, unspecified: Secondary | ICD-10-CM | POA: Diagnosis not present

## 2017-05-21 NOTE — Telephone Encounter (Signed)
Understood, thanks for the update. No further action needed at this time.

## 2017-05-21 NOTE — Telephone Encounter (Signed)
Spoke to Kristopher Gay today, he at this times still wishes to pass on the colonoscopy screening. He doesn't recall the surgeon saying he had to have a colonoscopy before the hernia repair. He will follow up with the surgeon to discuss. He has also stopped the Linzess as it didn't agree with him. He has been using maloxx and is having no constipation at this time.

## 2017-05-27 ENCOUNTER — Ambulatory Visit (INDEPENDENT_AMBULATORY_CARE_PROVIDER_SITE_OTHER): Payer: Medicare Other

## 2017-05-27 ENCOUNTER — Encounter: Payer: Self-pay | Admitting: Gastroenterology

## 2017-05-27 DIAGNOSIS — J309 Allergic rhinitis, unspecified: Secondary | ICD-10-CM | POA: Diagnosis not present

## 2017-05-28 DIAGNOSIS — J3089 Other allergic rhinitis: Secondary | ICD-10-CM | POA: Diagnosis not present

## 2017-05-28 NOTE — Progress Notes (Signed)
VIAL EXP 05-28-18

## 2017-06-04 ENCOUNTER — Ambulatory Visit (INDEPENDENT_AMBULATORY_CARE_PROVIDER_SITE_OTHER): Payer: Medicare Other | Admitting: *Deleted

## 2017-06-04 DIAGNOSIS — J309 Allergic rhinitis, unspecified: Secondary | ICD-10-CM | POA: Diagnosis not present

## 2017-06-11 ENCOUNTER — Ambulatory Visit (INDEPENDENT_AMBULATORY_CARE_PROVIDER_SITE_OTHER): Payer: Medicare Other | Admitting: *Deleted

## 2017-06-11 DIAGNOSIS — J309 Allergic rhinitis, unspecified: Secondary | ICD-10-CM

## 2017-06-15 ENCOUNTER — Encounter: Payer: Medicare Other | Admitting: Gastroenterology

## 2017-06-16 HISTORY — PX: OTHER SURGICAL HISTORY: SHX169

## 2017-06-16 LAB — HM COLONOSCOPY

## 2017-06-18 ENCOUNTER — Ambulatory Visit (INDEPENDENT_AMBULATORY_CARE_PROVIDER_SITE_OTHER): Payer: Medicare Other | Admitting: *Deleted

## 2017-06-18 DIAGNOSIS — J309 Allergic rhinitis, unspecified: Secondary | ICD-10-CM

## 2017-06-25 ENCOUNTER — Ambulatory Visit (INDEPENDENT_AMBULATORY_CARE_PROVIDER_SITE_OTHER): Payer: Medicare Other | Admitting: *Deleted

## 2017-06-25 DIAGNOSIS — J309 Allergic rhinitis, unspecified: Secondary | ICD-10-CM | POA: Diagnosis not present

## 2017-07-02 ENCOUNTER — Ambulatory Visit (INDEPENDENT_AMBULATORY_CARE_PROVIDER_SITE_OTHER): Payer: Medicare Other | Admitting: *Deleted

## 2017-07-02 DIAGNOSIS — J309 Allergic rhinitis, unspecified: Secondary | ICD-10-CM | POA: Diagnosis not present

## 2017-07-03 ENCOUNTER — Encounter: Payer: Self-pay | Admitting: General Surgery

## 2017-07-09 ENCOUNTER — Ambulatory Visit (INDEPENDENT_AMBULATORY_CARE_PROVIDER_SITE_OTHER): Payer: Medicare Other | Admitting: *Deleted

## 2017-07-09 DIAGNOSIS — J309 Allergic rhinitis, unspecified: Secondary | ICD-10-CM

## 2017-07-14 NOTE — Progress Notes (Signed)
Need orders in epic.  Preop on 9/20/

## 2017-07-14 NOTE — Patient Instructions (Addendum)
Kristopher Gay  07/14/2017   Your procedure is scheduled on: 07/20/2017    Report to [facility]  Entrance Take [place]  elevators to 3rd floor to  [place] at   1230pm    Call this number if you have problems the morning of surgery [phone]    Remember: ONLY 1 PERSON MAY GO WITH YOU TO SHORT STAY TO GET  READY MORNING OF YOUR SURGERY.  Do not eat food after midnite.  May have clear liquids from 12 midnite until 0830am then nothing by mouth.      Take these medicines the morning of surgery with A SIP OF WATER: ADVAIR                                You may not have any metal on your body including hair pins and              piercings  Do not wear jewelry, , lotions, powders or perfumes, deodorant                           Men may shave face and neck.   Do not bring valuables to the hospital. [place].  Contacts, dentures or bridgework may not be worn into surgery.  Leave suitcase in the car. After surgery it may be brought to your room.                     Please read over the following fact sheets you were given: _____________________________________________________________________             [facility] - Preparing for Surgery Before surgery, you can play an important role.  Because skin is not sterile, your skin needs to be as free of germs as possible.  You can reduce the number of germs on your skin by washing with CHG (chlorahexidine gluconate) soap before surgery.  CHG is an antiseptic cleaner which kills germs and bonds with the skin to continue killing germs even after washing. Please DO NOT use if you have an allergy to CHG or antibacterial soaps.  If your skin becomes reddened/irritated stop using the CHG and inform your nurse when you arrive at Short Stay. Do not shave (including legs and underarms) for at least 48 hours prior to the first CHG shower.  You may shave your  face/neck. Please follow these instructions carefully:  1.  Shower with CHG Soap the night before surgery and the  morning of Surgery.  2.  If you choose to wash your hair, wash your hair first as usual with your  normal  shampoo.  3.  After you shampoo, rinse your hair and body thoroughly to remove the  shampoo.                           4.  Use CHG as you would any other liquid soap.  You can apply chg directly  to the skin and wash                       Gently with a scrungie or clean washcloth.  5.  Apply the CHG Soap to your body ONLY FROM THE NECK DOWN.   Do not use on face/ open                           Wound or open sores. Avoid contact with eyes, ears mouth and genitals (private parts).                       Wash face,  Genitals (private parts) with your normal soap.             6.  Wash thoroughly, paying special attention to the area where your surgery  will be performed.  7.  Thoroughly rinse your body with warm water from the neck down.  8.  DO NOT shower/wash with your normal soap after using and rinsing off  the CHG Soap.                9.  Pat yourself dry with a clean towel.            10.  Wear clean pajamas.            11.  Place clean sheets on your bed the night of your first shower and do not  sleep with pets. Day of Surgery : Do not apply any lotions/deodorants the morning of surgery.  Please wear clean clothes to the hospital/surgery center.  FAILURE TO FOLLOW THESE INSTRUCTIONS MAY RESULT IN THE CANCELLATION OF YOUR SURGERY PATIENT SIGNATURE_________________________________  NURSE SIGNATURE__________________________________  ________________________________________________________________________    CLEAR LIQUID DIET   Foods Allowed                                                                     Foods Excluded  Coffee and tea, regular and decaf                             liquids that you cannot  Plain Jell-O in any flavor                                              see through such as: Fruit ices (not with fruit pulp)                                     milk, soups, orange juice  Iced Popsicles                                    All solid food Carbonated beverages, regular and diet                                    Cranberry, grape and apple juices Sports drinks like Gatorade Lightly seasoned clear broth or consume(fat free) Sugar, honey syrup  Sample Menu Breakfast  Lunch                                     Supper Cranberry juice                    Beef broth                            Chicken broth Jell-O                                     Grape juice                           Apple juice Coffee or tea                        Jell-O                                      Popsicle                                                Coffee or tea                        Coffee or tea  _____________________________________________________________________

## 2017-07-16 ENCOUNTER — Other Ambulatory Visit (HOSPITAL_COMMUNITY): Payer: Medicare Other

## 2017-07-16 ENCOUNTER — Encounter (HOSPITAL_COMMUNITY)
Admission: RE | Admit: 2017-07-16 | Discharge: 2017-07-16 | Disposition: A | Discharge status: Home / Self Care | Payer: Medicare Other | Source: Ambulatory Visit | Attending: General Surgery | Admitting: General Surgery

## 2017-07-16 ENCOUNTER — Ambulatory Visit: Payer: Self-pay | Admitting: General Surgery

## 2017-07-16 ENCOUNTER — Encounter (INDEPENDENT_AMBULATORY_CARE_PROVIDER_SITE_OTHER): Payer: Self-pay

## 2017-07-16 ENCOUNTER — Encounter (HOSPITAL_COMMUNITY): Payer: Self-pay

## 2017-07-16 DIAGNOSIS — Z01812 Encounter for preprocedural laboratory examination: Secondary | ICD-10-CM | POA: Insufficient documentation

## 2017-07-16 DIAGNOSIS — K4020 Bilateral inguinal hernia, without obstruction or gangrene, not specified as recurrent: Secondary | ICD-10-CM | POA: Insufficient documentation

## 2017-07-16 DIAGNOSIS — Z01818 Encounter for other preprocedural examination: Secondary | ICD-10-CM | POA: Insufficient documentation

## 2017-07-16 DIAGNOSIS — R109 Unspecified abdominal pain: Secondary | ICD-10-CM | POA: Insufficient documentation

## 2017-07-16 DIAGNOSIS — I10 Essential (primary) hypertension: Secondary | ICD-10-CM | POA: Insufficient documentation

## 2017-07-16 HISTORY — DX: Cough, unspecified: R05.9

## 2017-07-16 HISTORY — DX: Foot drop, unspecified foot: M21.379

## 2017-07-16 HISTORY — DX: Unspecified osteoarthritis, unspecified site: M19.90

## 2017-07-16 HISTORY — DX: Cough: R05

## 2017-07-16 HISTORY — DX: Essential (primary) hypertension: I10

## 2017-07-16 HISTORY — DX: Peripheral vascular disease, unspecified: I73.9

## 2017-07-16 HISTORY — DX: Gastro-esophageal reflux disease without esophagitis: K21.9

## 2017-07-16 HISTORY — DX: Umbilical hernia without obstruction or gangrene: K42.9

## 2017-07-16 HISTORY — DX: Polyneuropathy, unspecified: G62.9

## 2017-07-16 HISTORY — DX: Pneumonia, unspecified organism: J18.9

## 2017-07-16 LAB — CBC WITH DIFFERENTIAL/PLATELET
Basophils Absolute: 0 10*3/uL (ref 0.0–0.1)
Basophils Relative: 1 %
Eosinophils Absolute: 0.1 10*3/uL (ref 0.0–0.7)
Eosinophils Relative: 1 %
HCT: 43.2 % (ref 39.0–52.0)
Hemoglobin: 14.8 g/dL (ref 13.0–17.0)
Lymphocytes Relative: 25 %
Lymphs Abs: 2.1 10*3/uL (ref 0.7–4.0)
MCH: 30.9 pg (ref 26.0–34.0)
MCHC: 34.3 g/dL (ref 30.0–36.0)
MCV: 90.2 fL (ref 78.0–100.0)
Monocytes Absolute: 0.6 10*3/uL (ref 0.1–1.0)
Monocytes Relative: 8 %
Neutro Abs: 5.5 10*3/uL (ref 1.7–7.7)
Neutrophils Relative %: 65 %
Platelets: 173 10*3/uL (ref 150–400)
RBC: 4.79 MIL/uL (ref 4.22–5.81)
RDW: 13.3 % (ref 11.5–15.5)
WBC: 8.3 10*3/uL (ref 4.0–10.5)

## 2017-07-16 LAB — BASIC METABOLIC PANEL
Anion gap: 8 (ref 5–15)
BUN: 24 mg/dL — ABNORMAL HIGH (ref 6–20)
CO2: 24 mmol/L (ref 22–32)
Calcium: 9.3 mg/dL (ref 8.9–10.3)
Chloride: 108 mmol/L (ref 101–111)
Creatinine, Ser: 1.1 mg/dL (ref 0.61–1.24)
GFR calc Af Amer: >60 mL/min (ref >60)
GFR calc non Af Amer: >60 mL/min (ref >60)
Glucose, Bld: 104 mg/dL — ABNORMAL HIGH (ref 65–99)
Potassium: 4.2 mmol/L (ref 3.5–5.1)
Sodium: 140 mmol/L (ref 135–145)

## 2017-07-20 ENCOUNTER — Encounter (HOSPITAL_COMMUNITY): Payer: Self-pay | Admitting: *Deleted

## 2017-07-20 ENCOUNTER — Observation Stay (HOSPITAL_COMMUNITY)
Admission: RE | Admit: 2017-07-20 | Discharge: 2017-07-21 | Disposition: A | Discharge status: Home / Self Care | Payer: Medicare Other | Source: Ambulatory Visit | Attending: General Surgery | Admitting: General Surgery

## 2017-07-20 ENCOUNTER — Ambulatory Visit (HOSPITAL_COMMUNITY): Payer: Medicare Other

## 2017-07-20 ENCOUNTER — Encounter (HOSPITAL_COMMUNITY)
Admission: RE | Disposition: A | Discharge status: Home / Self Care | Payer: Self-pay | Source: Ambulatory Visit | Attending: General Surgery

## 2017-07-20 DIAGNOSIS — I8392 Asymptomatic varicose veins of left lower extremity: Secondary | ICD-10-CM | POA: Diagnosis not present

## 2017-07-20 DIAGNOSIS — K439 Ventral hernia without obstruction or gangrene: Secondary | ICD-10-CM | POA: Diagnosis present

## 2017-07-20 DIAGNOSIS — G894 Chronic pain syndrome: Secondary | ICD-10-CM | POA: Diagnosis not present

## 2017-07-20 DIAGNOSIS — K429 Umbilical hernia without obstruction or gangrene: Secondary | ICD-10-CM | POA: Insufficient documentation

## 2017-07-20 DIAGNOSIS — K660 Peritoneal adhesions (postprocedural) (postinfection): Secondary | ICD-10-CM | POA: Insufficient documentation

## 2017-07-20 DIAGNOSIS — M549 Dorsalgia, unspecified: Secondary | ICD-10-CM | POA: Diagnosis not present

## 2017-07-20 DIAGNOSIS — Z888 Allergy status to other drugs, medicaments and biological substances status: Secondary | ICD-10-CM | POA: Insufficient documentation

## 2017-07-20 DIAGNOSIS — K4020 Bilateral inguinal hernia, without obstruction or gangrene, not specified as recurrent: Secondary | ICD-10-CM | POA: Diagnosis not present

## 2017-07-20 DIAGNOSIS — M47899 Other spondylosis, site unspecified: Secondary | ICD-10-CM | POA: Diagnosis not present

## 2017-07-20 DIAGNOSIS — Z79899 Other long term (current) drug therapy: Secondary | ICD-10-CM | POA: Insufficient documentation

## 2017-07-20 DIAGNOSIS — J449 Chronic obstructive pulmonary disease, unspecified: Secondary | ICD-10-CM | POA: Insufficient documentation

## 2017-07-20 HISTORY — PX: LAPAROSCOPY: SHX197

## 2017-07-20 HISTORY — PX: INGUINAL HERNIA REPAIR: SHX194

## 2017-07-20 LAB — CBC
HCT: 41.2 % (ref 39.0–52.0)
Hemoglobin: 14.3 g/dL (ref 13.0–17.0)
MCH: 31.2 pg (ref 26.0–34.0)
MCHC: 34.7 g/dL (ref 30.0–36.0)
MCV: 90 fL (ref 78.0–100.0)
Platelets: 130 10*3/uL — ABNORMAL LOW (ref 150–400)
RBC: 4.58 MIL/uL (ref 4.22–5.81)
RDW: 13.1 % (ref 11.5–15.5)
WBC: 12.8 10*3/uL — ABNORMAL HIGH (ref 4.0–10.5)

## 2017-07-20 LAB — CREATININE, SERUM
Creatinine, Ser: 1.03 mg/dL (ref 0.61–1.24)
GFR calc Af Amer: >60 mL/min (ref >60)
GFR calc non Af Amer: >60 mL/min (ref >60)

## 2017-07-20 SURGERY — LAPAROSCOPY, DIAGNOSTIC
Anesthesia: General

## 2017-07-20 MED ORDER — SUFENTANIL CITRATE 50 MCG/ML IV SOLN
INTRAVENOUS | Status: DC | PRN
  Administered 2017-07-20: 20 ug via INTRAVENOUS
  Administered 2017-07-20 (×2): 10 ug via INTRAVENOUS

## 2017-07-20 MED ORDER — DOCUSATE SODIUM 100 MG PO CAPS: 100.0000 mg | ORAL_CAPSULE | Freq: Two times a day (BID) | ORAL | 0 refills | Status: DC

## 2017-07-20 MED ORDER — OXYCODONE HCL 5 MG PO TABS: 5.0000 mg | ORAL_TABLET | Freq: Four times a day (QID) | ORAL | 0 refills | Status: DC | PRN

## 2017-07-20 MED ORDER — SUFENTANIL CITRATE 50 MCG/ML IV SOLN
INTRAVENOUS | Status: AC
  Filled 2017-07-20: qty 1

## 2017-07-20 MED ORDER — HYDROMORPHONE HCL-NACL 0.5-0.9 MG/ML-% IV SOSY
PREFILLED_SYRINGE | INTRAVENOUS | Status: AC
  Filled 2017-07-20: qty 4

## 2017-07-20 MED ORDER — GABAPENTIN 300 MG PO CAPS
300.0000 mg | ORAL_CAPSULE | ORAL | Status: AC
  Administered 2017-07-20: 300 mg via ORAL
  Filled 2017-07-20: qty 1

## 2017-07-20 MED ORDER — CELECOXIB 200 MG PO CAPS
200.0000 mg | ORAL_CAPSULE | Freq: Two times a day (BID) | ORAL | Status: DC
  Administered 2017-07-20 – 2017-07-21 (×2): 200 mg via ORAL
  Filled 2017-07-20 (×2): qty 1

## 2017-07-20 MED ORDER — ONDANSETRON 4 MG PO TBDP: 4.0000 mg | ORAL_TABLET | Freq: Four times a day (QID) | ORAL | Status: DC | PRN

## 2017-07-20 MED ORDER — HYDROMORPHONE HCL-NACL 0.5-0.9 MG/ML-% IV SOSY
0.2500 mg | PREFILLED_SYRINGE | INTRAVENOUS | Status: DC | PRN
  Administered 2017-07-20 (×4): 0.5 mg via INTRAVENOUS

## 2017-07-20 MED ORDER — PROPOFOL 10 MG/ML IV BOLUS
INTRAVENOUS | Status: AC
  Filled 2017-07-20: qty 20

## 2017-07-20 MED ORDER — TRAMADOL HCL 50 MG PO TABS: 50.0000 mg | ORAL_TABLET | Freq: Four times a day (QID) | ORAL | Status: DC | PRN

## 2017-07-20 MED ORDER — ONDANSETRON HCL 4 MG/2ML IJ SOLN
INTRAMUSCULAR | Status: AC
Start: 2017-07-20 — End: 2017-07-20
  Filled 2017-07-20: qty 2

## 2017-07-20 MED ORDER — LACTATED RINGERS IV SOLN
INTRAVENOUS | Status: DC
  Administered 2017-07-20: 22:00:00 via INTRAVENOUS

## 2017-07-20 MED ORDER — MORPHINE SULFATE (PF) 2 MG/ML IV SOLN
2.0000 mg | INTRAVENOUS | Status: DC | PRN
  Administered 2017-07-20 – 2017-07-21 (×3): 2 mg via INTRAVENOUS
  Filled 2017-07-20 (×3): qty 1

## 2017-07-20 MED ORDER — DOCUSATE SODIUM 100 MG PO CAPS
100.0000 mg | ORAL_CAPSULE | Freq: Two times a day (BID) | ORAL | Status: DC
  Administered 2017-07-20 – 2017-07-21 (×2): 100 mg via ORAL
  Filled 2017-07-20 (×2): qty 1

## 2017-07-20 MED ORDER — ACETAMINOPHEN 500 MG PO TABS
1000.0000 mg | ORAL_TABLET | ORAL | Status: AC
  Administered 2017-07-20: 1000 mg via ORAL
  Filled 2017-07-20: qty 2

## 2017-07-20 MED ORDER — SUGAMMADEX SODIUM 200 MG/2ML IV SOLN
INTRAVENOUS | Status: DC | PRN
  Administered 2017-07-20: 250 mg via INTRAVENOUS

## 2017-07-20 MED ORDER — DIPHENHYDRAMINE HCL 50 MG/ML IJ SOLN: 12.5000 mg | Freq: Four times a day (QID) | INTRAMUSCULAR | Status: DC | PRN

## 2017-07-20 MED ORDER — BUPIVACAINE-EPINEPHRINE (PF) 0.25% -1:200000 IJ SOLN
INTRAMUSCULAR | Status: AC
  Filled 2017-07-20: qty 30

## 2017-07-20 MED ORDER — PROMETHAZINE HCL 25 MG/ML IJ SOLN: 6.2500 mg | INTRAMUSCULAR | Status: DC | PRN

## 2017-07-20 MED ORDER — LACTATED RINGERS IV SOLN
INTRAVENOUS | Status: DC
  Administered 2017-07-20 (×2): via INTRAVENOUS

## 2017-07-20 MED ORDER — DEXAMETHASONE SODIUM PHOSPHATE 10 MG/ML IJ SOLN
INTRAMUSCULAR | Status: DC | PRN
  Administered 2017-07-20: 10 mg via INTRAVENOUS

## 2017-07-20 MED ORDER — SODIUM CHLORIDE 0.9 % IJ SOLN
INTRAMUSCULAR | Status: AC
Start: 2017-07-20 — End: 2017-07-20
  Filled 2017-07-20: qty 10

## 2017-07-20 MED ORDER — CHLORHEXIDINE GLUCONATE CLOTH 2 % EX PADS
6.0000 | MEDICATED_PAD | Freq: Once | CUTANEOUS | Status: AC
  Administered 2017-07-20: 6 via TOPICAL

## 2017-07-20 MED ORDER — ROCURONIUM BROMIDE 50 MG/5ML IV SOSY
PREFILLED_SYRINGE | INTRAVENOUS | Status: AC
  Filled 2017-07-20: qty 5

## 2017-07-20 MED ORDER — MIDAZOLAM HCL 2 MG/2ML IJ SOLN
INTRAMUSCULAR | Status: DC | PRN
  Administered 2017-07-20: 2 mg via INTRAVENOUS

## 2017-07-20 MED ORDER — ONDANSETRON HCL 4 MG/2ML IJ SOLN
4.0000 mg | Freq: Four times a day (QID) | INTRAMUSCULAR | Status: DC | PRN
  Administered 2017-07-20: 4 mg via INTRAVENOUS
  Filled 2017-07-20: qty 2

## 2017-07-20 MED ORDER — ONDANSETRON HCL 4 MG/2ML IJ SOLN
INTRAMUSCULAR | Status: DC | PRN
  Administered 2017-07-20: 4 mg via INTRAVENOUS

## 2017-07-20 MED ORDER — KETOROLAC TROMETHAMINE 30 MG/ML IJ SOLN
INTRAMUSCULAR | Status: DC | PRN
  Administered 2017-07-20: 30 mg via INTRAVENOUS
  Administered 2017-07-20: 30 mg via INTRAMUSCULAR

## 2017-07-20 MED ORDER — DIPHENHYDRAMINE HCL 12.5 MG/5ML PO ELIX: 12.5000 mg | ORAL_SOLUTION | Freq: Four times a day (QID) | ORAL | Status: DC | PRN

## 2017-07-20 MED ORDER — LIDOCAINE 2% (20 MG/ML) 5 ML SYRINGE
INTRAMUSCULAR | Status: DC | PRN
  Administered 2017-07-20: 100 mg via INTRAVENOUS

## 2017-07-20 MED ORDER — BUPIVACAINE LIPOSOME 1.3 % IJ SUSP
20.0000 mL | Freq: Once | INTRAMUSCULAR | Status: AC
  Administered 2017-07-20: 20 mL
  Filled 2017-07-20: qty 20

## 2017-07-20 MED ORDER — BUPIVACAINE-EPINEPHRINE 0.25% -1:200000 IJ SOLN
INTRAMUSCULAR | Status: DC | PRN
  Administered 2017-07-20: 30 mL

## 2017-07-20 MED ORDER — IBUPROFEN 800 MG PO TABS: 800.0000 mg | ORAL_TABLET | Freq: Three times a day (TID) | ORAL | 0 refills | Status: DC | PRN

## 2017-07-20 MED ORDER — PROPOFOL 10 MG/ML IV BOLUS
INTRAVENOUS | Status: DC | PRN
  Administered 2017-07-20: 200 mg via INTRAVENOUS

## 2017-07-20 MED ORDER — POLYETHYLENE GLYCOL 3350 17 G PO PACK
17.0000 g | PACK | Freq: Every day | ORAL | Status: DC | PRN
  Administered 2017-07-21: 17 g via ORAL
  Filled 2017-07-20: qty 1

## 2017-07-20 MED ORDER — HYDRALAZINE HCL 20 MG/ML IJ SOLN: 10.0000 mg | INTRAMUSCULAR | Status: DC | PRN

## 2017-07-20 MED ORDER — DEXAMETHASONE SODIUM PHOSPHATE 10 MG/ML IJ SOLN
INTRAMUSCULAR | Status: AC
  Filled 2017-07-20: qty 1

## 2017-07-20 MED ORDER — GABAPENTIN 300 MG PO CAPS
300.0000 mg | ORAL_CAPSULE | Freq: Two times a day (BID) | ORAL | Status: DC
  Administered 2017-07-20 – 2017-07-21 (×2): 300 mg via ORAL
  Filled 2017-07-20 (×2): qty 1

## 2017-07-20 MED ORDER — ROCURONIUM BROMIDE 10 MG/ML (PF) SYRINGE
PREFILLED_SYRINGE | INTRAVENOUS | Status: DC | PRN
  Administered 2017-07-20 (×3): 10 mg via INTRAVENOUS
  Administered 2017-07-20: 50 mg via INTRAVENOUS
  Administered 2017-07-20: 20 mg via INTRAVENOUS

## 2017-07-20 MED ORDER — SUGAMMADEX SODIUM 200 MG/2ML IV SOLN
INTRAVENOUS | Status: AC
  Filled 2017-07-20: qty 2

## 2017-07-20 MED ORDER — OXYCODONE HCL 5 MG PO TABS
5.0000 mg | ORAL_TABLET | ORAL | Status: DC | PRN
  Administered 2017-07-21: 5 mg via ORAL
  Filled 2017-07-20: qty 1

## 2017-07-20 MED ORDER — LIDOCAINE 2% (20 MG/ML) 5 ML SYRINGE
INTRAMUSCULAR | Status: AC
  Filled 2017-07-20: qty 5

## 2017-07-20 MED ORDER — PNEUMOCOCCAL VAC POLYVALENT 25 MCG/0.5ML IJ INJ
0.5000 mL | INJECTION | INTRAMUSCULAR | Status: DC
  Filled 2017-07-20: qty 0.5

## 2017-07-20 MED ORDER — SUCCINYLCHOLINE CHLORIDE 200 MG/10ML IV SOSY
PREFILLED_SYRINGE | INTRAVENOUS | Status: AC
  Filled 2017-07-20: qty 10

## 2017-07-20 MED ORDER — CEFAZOLIN SODIUM-DEXTROSE 2-4 GM/100ML-% IV SOLN
2.0000 g | INTRAVENOUS | Status: AC
Start: 2017-07-20 — End: 2017-07-20
  Administered 2017-07-20: 2 g via INTRAVENOUS
  Filled 2017-07-20: qty 100

## 2017-07-20 MED ORDER — HEPARIN SODIUM (PORCINE) 5000 UNIT/ML IJ SOLN
5000.0000 [IU] | Freq: Three times a day (TID) | INTRAMUSCULAR | Status: DC
  Administered 2017-07-20 – 2017-07-21 (×3): 5000 [IU] via SUBCUTANEOUS
  Filled 2017-07-20 (×3): qty 1

## 2017-07-20 MED ORDER — MIDAZOLAM HCL 2 MG/2ML IJ SOLN
INTRAMUSCULAR | Status: AC
  Filled 2017-07-20: qty 2

## 2017-07-20 MED ORDER — EPHEDRINE SULFATE-NACL 50-0.9 MG/10ML-% IV SOSY
PREFILLED_SYRINGE | INTRAVENOUS | Status: DC | PRN
  Administered 2017-07-20: 15 mg via INTRAVENOUS

## 2017-07-20 SURGICAL SUPPLY — 51 items
ADH SKN CLS APL DERMABOND .7 (GAUZE/BANDAGES/DRESSINGS)
APL SKNCLS STERI-STRIP NONHPOA (GAUZE/BANDAGES/DRESSINGS) ×2
APPLIER CLIP 5 13 M/L LIGAMAX5 (MISCELLANEOUS)
APR CLP MED LRG 5 ANG JAW (MISCELLANEOUS)
BANDAGE ADH SHEER 1  50/CT (GAUZE/BANDAGES/DRESSINGS) ×12 IMPLANT
BENZOIN TINCTURE PRP APPL 2/3 (GAUZE/BANDAGES/DRESSINGS) ×4 IMPLANT
CABLE HIGH FREQUENCY MONO STRZ (ELECTRODE) ×4 IMPLANT
CHLORAPREP W/TINT 26ML (MISCELLANEOUS) ×4 IMPLANT
CLIP APPLIE 5 13 M/L LIGAMAX5 (MISCELLANEOUS) IMPLANT
CLOSURE WOUND 1/2 X4 (GAUZE/BANDAGES/DRESSINGS) ×1
COVER SURGICAL LIGHT HANDLE (MISCELLANEOUS) ×4 IMPLANT
DECANTER SPIKE VIAL GLASS SM (MISCELLANEOUS) ×4 IMPLANT
DERMABOND ADVANCED (GAUZE/BANDAGES/DRESSINGS)
DERMABOND ADVANCED .7 DNX12 (GAUZE/BANDAGES/DRESSINGS) IMPLANT
DEVICE PMI PUNCTURE CLOSURE (MISCELLANEOUS) ×2 IMPLANT
DEVICE SECURE STRAP 25 ABSORB (INSTRUMENTS) ×2 IMPLANT
DRAIN CHANNEL 19F RND (DRAIN) IMPLANT
ELECT REM PT RETURN 15FT ADLT (MISCELLANEOUS) ×4 IMPLANT
EVACUATOR SILICONE 100CC (DRAIN) IMPLANT
GAUZE SPONGE 2X2 8PLY STRL LF (GAUZE/BANDAGES/DRESSINGS) IMPLANT
GLOVE BIO SURGEON STRL SZ7 (GLOVE) ×4 IMPLANT
GLOVE BIOGEL PI IND STRL 7.0 (GLOVE) ×2 IMPLANT
GLOVE BIOGEL PI INDICATOR 7.0 (GLOVE) ×2
GLOVE SURG SS PI 7.0 STRL IVOR (GLOVE) ×8 IMPLANT
GOWN STRL REUS W/TWL LRG LVL3 (GOWN DISPOSABLE) ×4 IMPLANT
GOWN STRL REUS W/TWL XL LVL3 (GOWN DISPOSABLE) ×8 IMPLANT
KIT BASIN OR (CUSTOM PROCEDURE TRAY) ×4 IMPLANT
MESH 3DMAX LIGHT 4.8X6.7 LT XL (Mesh General) ×2 IMPLANT
MESH 3DMAX LIGHT 4.8X6.7 RT XL (Mesh General) ×2 IMPLANT
SCISSORS LAP 5X35 DISP (ENDOMECHANICALS) IMPLANT
SET IRRIG TUBING LAPAROSCOPIC (IRRIGATION / IRRIGATOR) ×2 IMPLANT
SHEARS HARMONIC ACE PLUS 36CM (ENDOMECHANICALS) ×2 IMPLANT
SLEEVE XCEL OPT CAN 5 100 (ENDOMECHANICALS) ×6 IMPLANT
SPONGE GAUZE 2X2 STER 10/PKG (GAUZE/BANDAGES/DRESSINGS) ×2
STRIP CLOSURE SKIN 1/2X4 (GAUZE/BANDAGES/DRESSINGS) ×3 IMPLANT
SUT ETHILON 2 0 PS N (SUTURE) IMPLANT
SUT MNCRL AB 4-0 PS2 18 (SUTURE) ×4 IMPLANT
SUT SILK 2 0 (SUTURE)
SUT SILK 2 0 SH CR/8 (SUTURE) IMPLANT
SUT SILK 2-0 18XBRD TIE 12 (SUTURE) IMPLANT
SUT SILK 3 0 (SUTURE)
SUT SILK 3 0 SH CR/8 (SUTURE) IMPLANT
SUT SILK 3-0 18XBRD TIE 12 (SUTURE) IMPLANT
SUT VICRYL 0 UR6 27IN ABS (SUTURE) ×2 IMPLANT
TOWEL OR 17X26 10 PK STRL BLUE (TOWEL DISPOSABLE) ×4 IMPLANT
TOWEL OR NON WOVEN STRL DISP B (DISPOSABLE) ×4 IMPLANT
TRAY LAPAROSCOPIC (CUSTOM PROCEDURE TRAY) ×4 IMPLANT
TROCAR BLADELESS OPT 5 100 (ENDOMECHANICALS) ×4 IMPLANT
TROCAR CANNULA W/PORT DUAL 5MM (MISCELLANEOUS) ×4 IMPLANT
TROCAR XCEL 12X100 BLDLESS (ENDOMECHANICALS) ×4 IMPLANT
TUBING INSUF HEATED (TUBING) ×4 IMPLANT

## 2017-07-20 NOTE — Anesthesia Procedure Notes (Signed)
Procedure Name: Intubation Date/Time: 07/20/2017 1:37 PM Performed by: Sharlette Dense Pre-anesthesia Checklist: Patient identified, Emergency Drugs available, Suction available and Patient being monitored Patient Re-evaluated:Patient Re-evaluated prior to induction Oxygen Delivery Method: Circle system utilized Preoxygenation: Pre-oxygenation with 100% oxygen Induction Type: IV induction Ventilation: Mask ventilation without difficulty Laryngoscope Size: Mac and 4 Grade View: Grade I Tube type: Oral Tube size: 8.0 mm Number of attempts: 1 Airway Equipment and Method: Stylet and Oral airway Placement Confirmation: ETT inserted through vocal cords under direct vision,  positive ETCO2 and breath sounds checked- equal and bilateral Secured at: 23 cm Tube secured with: Tape Dental Injury: Teeth and Oropharynx as per pre-operative assessment  Comments: Intubation performed by Colen Darling SRNA

## 2017-07-20 NOTE — Op Note (Signed)
Preop diagnosis: bilateral inguinal hernia  Postop diagnosis: bilateral indirect inguinal hernia, adhesive disease, small umbilical hernia  Procedure: laparoscopic Bilateral inguinal hernia repair with mesh, primary umbilical hernia repair, adhesiolysis, diagnostic laparoscopy  Surgeon: Gurney Maxin, M.D.  Asst: none  Anesthesia: Gen.   Indications for procedure: Kristopher Gay is a 66 y.o. male with symptoms of pain and Bilateral inguinal hernia(s). After discussing risks, alternatives and benefits he decided on laparoscopic repair and was brought to day surgery for repair.  Description of procedure: The patient was brought into the operative suite, placed supine. Anesthesia was administered with endotracheal tube. Patient was strapped in place. The patient was prepped and draped in the usual sterile fashion.  Next, a left subcostal small incision was made an a 4mm optical entry trocar was used to gain access to the peritoneal cavity. Pneumoperitoneum was applied with high flow and low pressure. Laparoscope was reinserted to confirm placement. On initial observation, small indirect inguinal hernias were seen on each side. There was some adhesive disease in the right lower quadrant, and a 23mm umbilical hernia was seen. A vertical incision was made through the umbilical skin and a 65m trocar introduced through the hernia. 1 36mm trocar was placed in the left lower quadrant and 1 51mm trocar was placed in the right lower quadrant.   Next, the adhesions of the cecum to the abdominal wall were taken down with harmonic scalpel. The appendix was identified and was normal. The small intestine was then run retrograde and showed no abnormalities. The colon was inspected and there were a few adhesions of the transverse colon to the gallbladder, these were partially taken down with harmonic scalpel.  Next, the peritoneum was incised with harmonic scalpel and blunt dissection was used to create a peritoneal  flap by freeing it away from the rectus muscles.  Next I began the dissection identifying the right ASIS laterally and then working back medially removing the filmy tissue and adhesions of the peritoneum to the abdominal wall. Hernia sac was completely dissected out of the canal. Vas deference and contents of the cord were safely dissected away of the hernia sac. This was repeated for the left side  A right 3D max extra large light weight mesh was inserted and tacked medially to the lacunar ligament. Additional tacks were used in the lateral and anterior areas. The mesh was positioned flat and directly up against the direct and indirect areas.   A left 3D max extra large light weight mesh was inserted and tacked medially to the lacunar ligament. Additional tacks were used in the lateral and anterior areas. The mesh was positioned flat and directly up against the direct and indirect areas. The peritoneum was tacked back up to the abdominal wall ensuring the mesh sat in a flat position. One hole in the peritoneum was present on the right side and the peritoneum was tacked to cover the hole to protect the visceral contents from the mesh.  3 0 vicryl sutures were used to close the 18mm fascia defect with PMI suture passer.   The remainder of the Exparel mix was infused along the line of the inguinal ligament deep to the fascia. All skin incisions were closed with 4-0 monocryl subcu stitch. The patient awoke from anesthesia and was brought to PACU in stable condition.  Findings: bilateral indirect inguinal hernia, 68mm umbilical hernia, adhesions of the cecum to the abdominal wall, small amount adhesive disease around gallbladder, normal small intestine, normal appendix  Specimen: none  Blood loss: 40 ml  Local anesthesia: 50 ml Exparel:Marcaine mix  Complications: none  Implant: extra large left and extra large right Bard 3D max light mesh  Gurney Maxin, M.D. General, Bariatric, & Minimally  Invasive Surgery [facility], [place] 3:36 PM 07/20/2017

## 2017-07-20 NOTE — Transfer of Care (Signed)
Immediate Anesthesia Transfer of Care Note  Patient: Kristopher Gay  Procedure(s) Performed: Procedure(s): LAPAROSCOPY DIAGNOSTIC (N/A) LAPAROSCOPIC BILATERAL INGUINAL HERNIA REPAIR WITH MESH, UMBILICAL HERNIA REPAIR AND LYSIS OF ADHESIONS (Bilateral)  Patient Location: PACU  Anesthesia Type:General  Level of Consciousness: sedated  Airway & Oxygen Therapy: Patient Spontanous Breathing and Patient connected to face mask oxygen  Post-op Assessment: Report given to RN and Post -op Vital signs reviewed and stable  Post vital signs: Reviewed and stable  Last Vitals:  Vitals:   07/20/17 1146  BP: (!) 175/91  Pulse: 84  Resp: 18  Temp: 37.1 C  SpO2: 96%    Last Pain:  Vitals:   07/20/17 1226  TempSrc:   PainSc: 6       Patients Stated Pain Goal: 4 (09/47/09 6283)  Complications: No apparent anesthesia complications

## 2017-07-20 NOTE — Anesthesia Preprocedure Evaluation (Addendum)
Anesthesia Evaluation  Patient identified by MRN, date of birth, ID band Patient awake    Reviewed: Allergy & Precautions, NPO status , Patient's Chart, lab work & pertinent test results  Airway Mallampati: II  TM Distance: >3 FB Neck ROM: Full    Dental  (+) Dental Advisory Given   Pulmonary asthma , COPD,    breath sounds clear to auscultation       Cardiovascular + Peripheral Vascular Disease   Rhythm:Regular Rate:Normal     Neuro/Psych Anxiety Depression  Neuromuscular disease    GI/Hepatic Neg liver ROS, GERD  ,  Endo/Other    Renal/GU      Musculoskeletal   Abdominal   Peds  Hematology   Anesthesia Other Findings   Reproductive/Obstetrics                            Lab Results  Component Value Date   WBC 8.3 07/16/2017   HGB 14.8 07/16/2017   HCT 43.2 07/16/2017   MCV 90.2 07/16/2017   PLT 173 07/16/2017   Lab Results  Component Value Date   CREATININE 1.10 07/16/2017   BUN 24 (H) 07/16/2017   NA 140 07/16/2017   K 4.2 07/16/2017   CL 108 07/16/2017   CO2 24 07/16/2017    Anesthesia Physical Anesthesia Plan  ASA: III  Anesthesia Plan: General   Post-op Pain Management:    Induction: Intravenous  PONV Risk Score and Plan: 3 and Ondansetron, Dexamethasone, Midazolam and Treatment may vary due to age or medical condition  Airway Management Planned: Oral ETT  Additional Equipment:   Intra-op Plan:   Post-operative Plan: Extubation in OR  Informed Consent: I have reviewed the patients History and Physical, chart, labs and discussed the procedure including the risks, benefits and alternatives for the proposed anesthesia with the patient or authorized representative who has indicated his/her understanding and acceptance.   Dental advisory given  Plan Discussed with: CRNA  Anesthesia Plan Comments:        Anesthesia Quick Evaluation

## 2017-07-20 NOTE — H&P (Signed)
Kristopher Gay is an 66 y.o. male.   Chief Complaint: abdominal pain HPI: 66 yo male with chronic abdominal pain found to have multiple hernias on CT presents for hernia repair.  Past Medical History:  Diagnosis Date  . Allergic rhinitis, cause unspecified   . Allergy   . Anxiety   . Blood clot in vein 2002   left calf SUPERFICIAL SLOT REMOVED THEN PART OF VEIN REMOVED  . Chronic headaches   . Complication of anesthesia   . COPD (chronic obstructive pulmonary disease) ([place])   . Cough    X 1 YEAR NO FEVER CLEAR SPUTUM  . DDD (degenerative disc disease)   . Depression   . DJD (degenerative joint disease)    ALL THE WAY DOWN SPINE  . Dropfoot    LEFT , NO BRACE WORN NOW  . Emphysema of lung ([place])   . Extrinsic asthma, unspecified   . GERD (gastroesophageal reflux disease)    OCC NO MEDS FOR  . Hypertension    STOPPED HTN MEDS UNTIL 2011, THEN HAD WEIGHT LOSS, NO MEDS SINCE  . Neuromuscular disorder ([place])   . Neuropathy    POLYNEUOPATHOPATHY FEET AND LEGS  . Peripheral vascular disease ([facility])    VARICOSE VEINS LEFT LEG  . Pneumonia AS CHILD  . Umbilical hernia   . Unspecified asthma(493.90)     Past Surgical History:  Procedure Laterality Date  . COLONSCOPY  06/16/2017  . ROTATOR CUFF REPAIR Left 2005   detached; left shoulder-reattached  . SHOULDER SURGERY Right    laBRAL  tendon torn  . SUPERFICIAL LEFT LEG VEIN STRIPPING WITH SMALL SUPERFICIAL CLOT  2002    Family History  Problem Relation Age of Onset  . Pancreatic cancer Mother   . Breast cancer Sister   . Epilepsy Brother   . Adrenal disorder Neg Hx    Social History:  reports that he has never smoked. He has never used smokeless tobacco. He reports that he does not drink alcohol or use drugs.  Allergies:  Allergies  Allergen Reactions  . Bee Venom Swelling  . Linzess [Linaclotide] Other (See Comments)    Abdominal pain  . Molds & Smuts   . Seasonal Ic [Cholestatin]     Environmental Allergies     Medications Prior to Admission  Medication Sig Dispense Refill  . ADVAIR DISKUS 500-50 MCG/DOSE AEPB INHALE 1 PUFF BY MOUTH TWICE DAILY, THEN RINSE MOUTH 1 each 9  . ascorbic acid (VITAMIN C) 1000 MG tablet Take 1,000 mg by mouth 2 (two) times daily.     . Calcium-Magnesium-Vitamin D (CALCIUM 1200+D3 PO) Take 1 tablet by mouth 2 (two) times daily.    . celecoxib (CELEBREX) 200 MG capsule TAKE 1 CAPSULE(200 MG) BY MOUTH DAILY 30 capsule 2  . Cholecalciferol (VITAMIN D3) 2000 units TABS Take 1 tablet by mouth 2 (two) times daily.     Marland Kitchen DHEA 25 MG CAPS Take 1 capsule by mouth daily. Reported on 12/19/2015    . EPINEPHrine (EPIPEN 2-PAK) 0.3 mg/0.3 mL SOAJ injection Inject 0.3 mg into the muscle once. Reported on 12/13/2015    . Glucosamine-Chondroit-Vit C-Mn (GLUCOSAMINE 1500 COMPLEX) CAPS Take 1 capsule by mouth 2 (two) times daily. Reported on 12/19/2015    . montelukast (SINGULAIR) 10 MG tablet Take 10 mg by mouth at bedtime.    . Multiple Vitamin (MULTIVITAMIN) tablet Take 1 tablet by mouth daily. Reported on 12/19/2015    . Omega-3 Fatty Acids (FISH OIL) 1000 MG  CAPS Take 1 capsule by mouth 2 (two) times daily. Reported on 12/19/2015    . vitamin B-12 (CYANOCOBALAMIN) 500 MCG tablet Take 500 mcg by mouth daily.    . vitamin E (VITAMIN E) 400 UNIT capsule Take 400 Units by mouth 2 (two) times daily. Reported on 12/19/2015      No results found. However, due to the size of the patient record, not all encounters were searched. Please check Results Review for a complete set of results. No results found.  Review of Systems  Constitutional: Negative for chills and fever.  HENT: Negative for hearing loss.   Eyes: Negative for blurred vision and double vision.  Respiratory: Negative for cough and hemoptysis.   Cardiovascular: Negative for chest pain and palpitations.  Gastrointestinal: Positive for abdominal pain, constipation and nausea. Negative for vomiting.  Genitourinary: Negative for  dysuria and urgency.  Musculoskeletal: Negative for myalgias and neck pain.  Skin: Negative for itching and rash.  Neurological: Negative for dizziness, tingling and headaches.  Endo/Heme/Allergies: Does not bruise/bleed easily.  Psychiatric/Behavioral: Negative for depression and suicidal ideas.    Blood pressure (!) 175/91, pulse 84, temperature 98.7 F (37.1 C), temperature source Oral, resp. rate 18, height 6\' 2"  (1.88 m), weight 110.7 kg (244 lb), SpO2 96 %. Physical Exam  Vitals reviewed. Constitutional: [name], place, and time. He appears well-developed and well-nourished.  HENT:  Head: Normocephalic and atraumatic.  Eyes: Pupils are equal, round, and reactive to light. Conjunctivae and EOM are normal.  Neck: Normal range of motion. Neck supple.  Cardiovascular: Normal rate and regular rhythm.   Respiratory: Effort normal and breath sounds normal.  GI: Soft. Bowel sounds are normal. He exhibits no distension. There is tenderness.  Musculoskeletal: Normal range of motion.  Neurological: He is alert and oriented to person, place, and time.  Skin: Skin is warm and dry.  Psychiatric: [name] His behavior is normal.     Assessment/Plan 66 yo male with bilateral inguinal hernia and generalized abdominal pain -diagnostic laparoscopy with bilateral inguinal hernia repair  Mickeal Skinner, MD 07/20/2017, 1:14 PM

## 2017-07-20 NOTE — Anesthesia Postprocedure Evaluation (Signed)
Anesthesia Post Note  Patient: Kristopher Gay  Procedure(s) Performed: Procedure(s) (LRB): LAPAROSCOPY DIAGNOSTIC (N/A) LAPAROSCOPIC BILATERAL INGUINAL HERNIA REPAIR WITH MESH, UMBILICAL HERNIA REPAIR AND LYSIS OF ADHESIONS (Bilateral)     Patient location during evaluation: PACU Anesthesia Type: General Level of consciousness: awake and alert Pain management: pain level controlled Vital Signs Assessment: post-procedure vital signs reviewed and stable Respiratory status: spontaneous breathing, nonlabored ventilation, respiratory function stable and patient connected to nasal cannula oxygen Cardiovascular status: blood pressure returned to baseline and stable Postop Assessment: no apparent nausea or vomiting Anesthetic complications: no    Last Vitals:  Vitals:   07/20/17 1645 07/20/17 1654  BP: 133/79 137/85  Pulse: 66 68  Resp: 13 13  Temp:  36.7 C  SpO2: 92% 95%    Last Pain:  Vitals:   07/20/17 1654  TempSrc:   PainSc: 6                  Tiajuana Amass

## 2017-07-21 ENCOUNTER — Encounter (HOSPITAL_COMMUNITY): Payer: Self-pay | Admitting: General Surgery

## 2017-07-21 DIAGNOSIS — K4020 Bilateral inguinal hernia, without obstruction or gangrene, not specified as recurrent: Secondary | ICD-10-CM | POA: Diagnosis not present

## 2017-07-21 MED ORDER — MOMETASONE FURO-FORMOTEROL FUM 200-5 MCG/ACT IN AERO
2.0000 | INHALATION_SPRAY | Freq: Two times a day (BID) | RESPIRATORY_TRACT | Status: DC
  Administered 2017-07-21 (×2): 2 via RESPIRATORY_TRACT
  Filled 2017-07-21: qty 8.8

## 2017-07-21 MED ORDER — MONTELUKAST SODIUM 10 MG PO TABS
10.0000 mg | ORAL_TABLET | Freq: Every day | ORAL | Status: DC
  Administered 2017-07-21: 10 mg via ORAL
  Filled 2017-07-21: qty 1

## 2017-07-21 MED ORDER — KETOROLAC TROMETHAMINE 15 MG/ML IJ SOLN
15.0000 mg | Freq: Three times a day (TID) | INTRAMUSCULAR | Status: DC
  Administered 2017-07-21: 15 mg via INTRAVENOUS
  Filled 2017-07-21: qty 1

## 2017-07-21 NOTE — Progress Notes (Signed)
Pt was discharged home today. Instructions were reviewed with patient, and questions were answered. Pt was taken to main entrance via wheelchair by NT.  

## 2017-07-21 NOTE — Care Management Obs Status (Signed)
[facility] NOTIFICATION   Patient Details  Name: Kristopher Gay MRN: [id number] Date of Birth: 12-01-1950   Medicare Observation Status Notification Given:  Yes    Guadalupe Maple, RN 07/21/2017, 1:43 PM

## 2017-07-21 NOTE — Evaluation (Signed)
Physical Therapy Evaluation Patient Details Name: Kristopher Gay MRN: [id number] DOB: 01-Feb-1951 Today's Date: 07/21/2017   History of Present Illness  66 yo male s/p lap bilateral inguinal hernia repair with mesh, umbilical hernia repair 01/25/01.   Clinical Impression  On eval, pt required Min assist for mobility. He walked ~115 feet with his straight cane. Pt had a LOB x 3 during PT session. He c/o 9/10 pain with activity. Discussed f/u care and assistive devices during session. He refuses f/u HHPT and RW use despite acknowledging that he is unsteady. He feels LOB is medication related. Recommend daily ambulation with nursing supervision and use of RW for safety, if pt will agree to it. He stated he plans to d/c home once he feels ready.     Follow Up Recommendations Home health PT (pt refuses f/u PT)    Equipment Recommendations  Rolling walker with 5" wheels (pt refuses RW)    Recommendations for Other Services OT consult     Precautions / Restrictions Precautions Precautions: Fall Precaution Comments: wears L AFO for foot drop at baseline-did not wear on eval Restrictions Weight Bearing Restrictions: No      Mobility  Bed Mobility Overal bed mobility: Modified Independent   Rolling: Modified independent (Device/Increase time) Sidelying to sit: HOB elevated;Modified independent (Device/Increase time)          Transfers Overall transfer level: Needs assistance Equipment used: Straight cane Transfers: Sit to/from Stand Sit to Stand: Min guard         General transfer comment: close guard for safety. Pt moves quickly.   Ambulation/Gait Ambulation/Gait assistance: Min assist Ambulation Distance (Feet): 115 Feet Assistive device: Straight cane Gait Pattern/deviations: Step-through pattern;Decreased stride length;Staggering left;Drifts right/left;Staggering right     General Gait Details: LOB x 2 during ambulation-required small amount of external assist from  therapist to prevent fall. Distance limited by pain. Discussed using a RW-pt refuses.   Stairs            Wheelchair Mobility    Modified Rankin (Stroke Patients Only)       Balance Overall balance assessment: Needs assistance;History of Falls         Standing balance support: Single extremity supported Standing balance-Leahy Scale: Poor Standing balance comment: LOB x 3-once during static standing and twice during ambulation. Pt feels LOB is medication related                             Pertinent Vitals/Pain Pain Assessment: 0-10 Pain Score: 9  Pain Location: surgical sites Pain Intervention(s): Limited activity within patient's tolerance;Monitored during session    [place] expects to be discharged to:: Private residence Living Arrangements: Alone   Type of Home: House Home Access: Stairs to enter   Technical brewer of Steps: 1 Home Layout: One level Home Equipment: Cane - single point      Prior Function Level of Independence: Independent with assistive device(s)         Comments: uses cane for balance issues     Hand Dominance        Extremity/Trunk Assessment   Upper Extremity Assessment Upper Extremity Assessment: Overall WFL for tasks assessed    Lower Extremity Assessment Lower Extremity Assessment: Generalized weakness    Cervical / Trunk Assessment Cervical / Trunk Assessment: Normal  Communication   Communication: No difficulties  Cognition Arousal/Alertness: Awake/alert Behavior During Therapy: WFL for tasks assessed/performed Overall Cognitive Status: Within Functional Limits  for tasks assessed                                        General Comments      Exercises     Assessment/Plan    PT Assessment Patient needs continued PT services  PT Problem List Decreased mobility;Decreased activity tolerance;Pain;Decreased balance       PT Treatment Interventions Gait  training;Therapeutic activities;Therapeutic exercise;DME instruction;Patient/family education;Balance training;Functional mobility training    PT Goals (Current goals can be found in the Care Plan section)  Acute Rehab PT Goals Patient Stated Goal: home PT Goal Formulation: With patient Time For Goal Achievement: 08/04/17 Potential to Achieve Goals: Good    Frequency Min 3X/week   Barriers to discharge        Co-evaluation               AM-PAC PT "6 Clicks" Daily Activity  Outcome Measure Difficulty turning over in bed (including adjusting bedclothes, sheets and blankets)?: A Little Difficulty moving from lying on back to sitting on the side of the bed? : A Little Difficulty sitting down on and standing up from a chair with arms (e.g., wheelchair, bedside commode, etc,.)?: A Little Help needed moving to and from a bed to chair (including a wheelchair)?: A Little Help needed walking in hospital room?: A Little Help needed climbing 3-5 steps with a railing? : A Little 6 Click Score: 18    End of Session   Activity Tolerance: Patient limited by pain Patient left:  (standing in room receiving respiratory tx-he declined getting back to bed)   PT Visit Diagnosis: Muscle weakness (generalized) (M62.81);Difficulty in walking, not elsewhere classified (R26.2);Pain Pain - part of body:  (abdomen)    Time: 8182-9937 PT Time Calculation (min) (ACUTE ONLY): 29 min   Charges:   PT Evaluation $PT Eval Low Complexity: 1 Low     PT G Codes:   PT G-Codes **NOT FOR INPATIENT CLASS** Functional Assessment Tool Used: AM-PAC 6 Clicks Basic Mobility;Clinical judgement Functional Limitation: Mobility: Walking and moving around Mobility: Walking and Moving Around Current Status ([id number]): At least 20 percent but less than 40 percent impaired, limited or restricted Mobility: Walking and Moving Around Goal Status [phone]): At least 1 percent but less than 20 percent impaired, limited or  restricted      Weston Anna, MPT Pager: [phone]

## 2017-07-21 NOTE — Progress Notes (Signed)
  Progress Note: General Surgery Service   Assessment/Plan: Patient Active Problem List   Diagnosis Date Noted  . Hernia of abdominal wall 07/20/2017  . Chronic right shoulder pain 05/15/2016  . Right knee pain 05/15/2016  . Adrenal adenoma 02/20/2016  . Chronic pain syndrome 10/12/2015  . Odynophagia 05/22/2015  . Dysphagia, pharyngoesophageal phase 09/14/2014  . Colon cancer screening 09/14/2014  . Other chronic postoperative pain 01/16/2014  . Allergy, insect bite 07/02/2012  . GERD (gastroesophageal reflux disease) 03/25/2012  . 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 Lumbosacral spondylosis 01/13/2012  . Chronic back pain 11/17/2011  . Seasonal and perennial allergic rhinitis 12/26/2010  . MUSCULOSKELETAL PAIN 04/16/2009  . Asthmatic bronchitis , chronic ([place]) 01/30/2008   s/p Procedure(s): LAPAROSCOPY DIAGNOSTIC LAPAROSCOPIC BILATERAL INGUINAL HERNIA REPAIR WITH MESH, UMBILICAL HERNIA REPAIR AND LYSIS OF ADHESIONS 07/20/2017 -continue supportive care -PT consult -patient can go home whenever he is ready    LOS: 0 days  Chief Complaint/Subjective: Moderate abdominal pain, some nausea on ambulation  Objective: Vital signs in last 24 hours: Temp:  [97.4 F (36.3 C)-98.7 F (37.1 C)] 97.4 F (36.3 C) (09/25 7341) Pulse Rate:  [54-84] 54 (09/25 0613) Resp:  [13-19] 15 (09/25 9379) BP: (115-175)/(61-91) 115/67 (09/25 0613) SpO2:  [92 %-100 %] 100 % (09/25 0240) Weight:  [110.7 kg (244 lb)] 110.7 kg (244 lb) (09/24 1146) Last BM Date: 07/20/17  Intake/Output from previous day: 09/24 0701 - 09/25 0700 In: 2971.3 [P.O.:450; I.V.:2521.3] Out: 450 [Urine:400; Blood:50] Intake/Output this shift: Total I/O In: 228.8 [P.O.:180; I.V.:48.8] Out: -   Lungs: CTAb  Cardiovascular: bradycardic  Abd: soft, ATTP, minimal seep through  Extremities: no edema  Neuro: AOx4  Lab Results: CBC   Recent Labs  07/20/17 1759  WBC 12.8*  HGB 14.3  HCT 41.2  PLT 130*    BMET  Recent Labs  07/20/17 1759  CREATININE 1.03   PT/INR No results for input(s): LABPROT, INR in the last 72 hours. ABG No results for input(s): PHART, HCO3 in the last 72 hours.  Invalid input(s): PCO2, PO2  Studies/Results:  Anti-infectives: Anti-infectives    Start     Dose/Rate Route Frequency Ordered Stop   07/20/17 1142  ceFAZolin (ANCEF) IVPB 2g/100 mL premix     2 g 200 mL/hr over 30 Minutes Intravenous On call to O.R. 07/20/17 1142 07/20/17 1400      Medications: Scheduled Meds: . celecoxib  200 mg Oral BID  . docusate sodium  100 mg Oral BID  . gabapentin  300 mg Oral BID  . heparin subcutaneous  5,000 Units Subcutaneous Q8H  . mometasone-formoterol  2 puff Inhalation BID  . montelukast  10 mg Oral QHS  . pneumococcal 23 valent vaccine  0.5 mL Intramuscular Tomorrow-1000   Continuous Infusions: . lactated ringers 75 mL/hr at 07/20/17 2134   PRN Meds:.diphenhydrAMINE **OR** diphenhydrAMINE, hydrALAZINE, morphine injection, ondansetron **OR** ondansetron (ZOFRAN) IV, oxyCODONE, polyethylene glycol, traMADol  Mickeal Skinner, MD Pg# [phone] [facility]

## 2017-07-23 ENCOUNTER — Encounter (HOSPITAL_COMMUNITY): Payer: Self-pay | Admitting: *Deleted

## 2017-07-23 ENCOUNTER — Emergency Department (HOSPITAL_COMMUNITY)
Admission: EM | Admit: 2017-07-23 | Discharge: 2017-07-23 | Disposition: A | Discharge status: Home / Self Care | Payer: Medicare Other | Attending: Emergency Medicine | Admitting: Emergency Medicine

## 2017-07-23 ENCOUNTER — Emergency Department (HOSPITAL_COMMUNITY): Payer: Medicare Other

## 2017-07-23 DIAGNOSIS — Z79899 Other long term (current) drug therapy: Secondary | ICD-10-CM | POA: Insufficient documentation

## 2017-07-23 DIAGNOSIS — N50811 Right testicular pain: Secondary | ICD-10-CM | POA: Insufficient documentation

## 2017-07-23 DIAGNOSIS — J449 Chronic obstructive pulmonary disease, unspecified: Secondary | ICD-10-CM | POA: Insufficient documentation

## 2017-07-23 DIAGNOSIS — D696 Thrombocytopenia, unspecified: Secondary | ICD-10-CM | POA: Insufficient documentation

## 2017-07-23 LAB — COMPREHENSIVE METABOLIC PANEL
ALT: 19 U/L (ref 17–63)
AST: 25 U/L (ref 15–41)
Albumin: 3.7 g/dL (ref 3.5–5.0)
Alkaline Phosphatase: 73 U/L (ref 38–126)
Anion gap: 7 (ref 5–15)
BUN: 15 mg/dL (ref 6–20)
CO2: 26 mmol/L (ref 22–32)
Calcium: 9.2 mg/dL (ref 8.9–10.3)
Chloride: 105 mmol/L (ref 101–111)
Creatinine, Ser: 0.94 mg/dL (ref 0.61–1.24)
GFR calc Af Amer: >60 mL/min (ref >60)
GFR calc non Af Amer: >60 mL/min (ref >60)
Glucose, Bld: 108 mg/dL — ABNORMAL HIGH (ref 65–99)
Potassium: 4.1 mmol/L (ref 3.5–5.1)
Sodium: 138 mmol/L (ref 135–145)
Total Bilirubin: 1.3 mg/dL — ABNORMAL HIGH (ref 0.3–1.2)
Total Protein: 6.7 g/dL (ref 6.5–8.1)

## 2017-07-23 LAB — CBC WITH DIFFERENTIAL/PLATELET
Basophils Absolute: 0 10*3/uL (ref 0.0–0.1)
Basophils Relative: 0 %
Eosinophils Absolute: 0.1 10*3/uL (ref 0.0–0.7)
Eosinophils Relative: 2 %
HCT: 40.7 % (ref 39.0–52.0)
Hemoglobin: 14.1 g/dL (ref 13.0–17.0)
Lymphocytes Relative: 18 %
Lymphs Abs: 1.4 10*3/uL (ref 0.7–4.0)
MCH: 31.3 pg (ref 26.0–34.0)
MCHC: 34.6 g/dL (ref 30.0–36.0)
MCV: 90.4 fL (ref 78.0–100.0)
Monocytes Absolute: 1 10*3/uL (ref 0.1–1.0)
Monocytes Relative: 12 %
Neutro Abs: 5.4 10*3/uL (ref 1.7–7.7)
Neutrophils Relative %: 68 %
Platelets: 122 10*3/uL — ABNORMAL LOW (ref 150–400)
RBC: 4.5 MIL/uL (ref 4.22–5.81)
RDW: 13.3 % (ref 11.5–15.5)
WBC: 7.9 10*3/uL (ref 4.0–10.5)

## 2017-07-23 LAB — URINALYSIS, ROUTINE W REFLEX MICROSCOPIC
Bilirubin Urine: NEGATIVE
Glucose, UA: NEGATIVE mg/dL
Hgb urine dipstick: NEGATIVE
Ketones, ur: NEGATIVE mg/dL
Leukocytes, UA: NEGATIVE
Nitrite: NEGATIVE
Protein, ur: NEGATIVE mg/dL
Specific Gravity, Urine: 1.015 (ref 1.005–1.030)
pH: 5 (ref 5.0–8.0)

## 2017-07-23 NOTE — ED Provider Notes (Signed)
[facility] DEPT Provider Note   CSN: [id number] Arrival date & time: 07/23/17  0147     History   Chief Complaint Chief Complaint  Patient presents with  . Testicle Pain    HPI Kristopher Gay is a 66 y.o. male.  The history is provided by the patient.  he had laparoscopic hernia repairs done 3 days ago, discharged from the hospital yesterday. He states he had been constipated, so took several doses of MiraLAX with successful bowel evacuation. Tonight, he noted that his right testicle was turning black and is painful. He rates pain at 7/10. He denies fever or chills. It is worse with movement, nothing makes it better.He has been trying to avoid taking narcotics because of constipation. He denies any difficulty urinating. He is concerned about possible gangrene, possible injury to the testicle during surgery.  Past Medical History:  Diagnosis Date  . Allergic rhinitis, cause unspecified   . Allergy   . Anxiety   . Blood clot in vein 2002   left calf SUPERFICIAL SLOT REMOVED THEN PART OF VEIN REMOVED  . Chronic headaches   . Complication of anesthesia   . COPD (chronic obstructive pulmonary disease) ([place])   . Cough    X 1 YEAR NO FEVER CLEAR SPUTUM  . DDD (degenerative disc disease)   . Depression   . DJD (degenerative joint disease)    ALL THE WAY DOWN SPINE  . Dropfoot    LEFT , NO BRACE WORN NOW  . Emphysema of lung ([place])   . Extrinsic asthma, unspecified   . GERD (gastroesophageal reflux disease)    OCC NO MEDS FOR  . Hypertension    STOPPED HTN MEDS UNTIL 2011, THEN HAD WEIGHT LOSS, NO MEDS SINCE  . Neuromuscular disorder ([place])   . Neuropathy    POLYNEUOPATHOPATHY FEET AND LEGS  . Peripheral vascular disease ([facility])    VARICOSE VEINS LEFT LEG  . Pneumonia AS CHILD  . Umbilical hernia   . Unspecified asthma(493.90)     Patient Active Problem List   Diagnosis Date Noted  . Hernia of abdominal wall 07/20/2017  . Chronic right shoulder pain 05/15/2016  . Right  knee pain 05/15/2016  . Adrenal adenoma 02/20/2016  . Chronic pain syndrome 10/12/2015  . Odynophagia 05/22/2015  . Dysphagia, pharyngoesophageal phase 09/14/2014  . Colon cancer screening 09/14/2014  . Other chronic postoperative pain 01/16/2014  . Allergy, insect bite 07/02/2012  . GERD (gastroesophageal reflux disease) 03/25/2012  . 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 Lumbosacral spondylosis 01/13/2012  . Chronic back pain 11/17/2011  . Seasonal and perennial allergic rhinitis 12/26/2010  . MUSCULOSKELETAL PAIN 04/16/2009  . Asthmatic bronchitis , chronic ([place]) 01/30/2008    Past Surgical History:  Procedure Laterality Date  . COLONSCOPY  06/16/2017  . INGUINAL HERNIA REPAIR Bilateral 07/20/2017   Procedure: LAPAROSCOPIC BILATERAL INGUINAL HERNIA REPAIR WITH MESH, UMBILICAL HERNIA REPAIR AND LYSIS OF ADHESIONS;  Surgeon: [name], [name], MD;  Location: [facility];  Service: General;  Laterality: Bilateral;  . LAPAROSCOPY N/A 07/20/2017   Procedure: LAPAROSCOPY DIAGNOSTIC;  Surgeon: Kieth Brightly [name], MD;  Location: [facility];  Service: General;  Laterality: N/A;  . ROTATOR CUFF REPAIR Left 2005   detached; left shoulder-reattached  . SHOULDER SURGERY Right    laBRAL  tendon torn  . SUPERFICIAL LEFT LEG VEIN STRIPPING WITH SMALL SUPERFICIAL CLOT  2002       Home Medications    Prior to Admission medications   Medication Sig  Start Date End Date Taking? Authorizing Provider  ADVAIR DISKUS 500-50 MCG/DOSE AEPB INHALE 1 PUFF BY MOUTH TWICE DAILY, THEN RINSE MOUTH 12/11/16   Young, Tarri Fuller D, MD  ascorbic acid (VITAMIN C) 1000 MG tablet Take 1,000 mg by mouth 2 (two) times daily.     [provider]  Calcium-Magnesium-Vitamin D (CALCIUM 1200+D3 PO) Take 1 tablet by mouth 2 (two) times daily.    [provider]  celecoxib (CELEBREX) 200 MG capsule TAKE 1 CAPSULE(200 MG) BY MOUTH DAILY 04/13/17   Kirsteins, Luanna Salk, MD  Cholecalciferol (VITAMIN D3) 2000 units  TABS Take 1 tablet by mouth 2 (two) times daily.     [provider]  DHEA 25 MG CAPS Take 1 capsule by mouth daily. Reported on 12/19/2015    [provider]  docusate sodium (COLACE) 100 MG capsule Take 1 capsule (100 mg total) by mouth 2 (two) times daily. 07/20/17   [name], [name], MD  EPINEPHrine (EPIPEN 2-PAK) 0.3 mg/0.3 mL SOAJ injection Inject 0.3 mg into the muscle once. Reported on 12/13/2015    [provider]  Glucosamine-Chondroit-Vit C-Mn (GLUCOSAMINE 1500 COMPLEX) CAPS Take 1 capsule by mouth 2 (two) times daily. Reported on 12/19/2015    [provider]  ibuprofen (ADVIL,MOTRIN) 800 MG tablet Take 1 tablet (800 mg total) by mouth every 8 (eight) hours as needed. 07/20/17   [name], [name], MD  montelukast (SINGULAIR) 10 MG tablet Take 10 mg by mouth at bedtime.    [provider]  Multiple Vitamin (MULTIVITAMIN) tablet Take 1 tablet by mouth daily. Reported on 12/19/2015    [provider]  Omega-3 Fatty Acids (FISH OIL) 1000 MG CAPS Take 1 capsule by mouth 2 (two) times daily. Reported on 12/19/2015    [provider]  oxyCODONE (OXY IR/ROXICODONE) 5 MG immediate release tablet Take 1-2 tablets (5-10 mg total) by mouth every 6 (six) hours as needed for severe pain. 07/20/17   [name], [name], MD  vitamin B-12 (CYANOCOBALAMIN) 500 MCG tablet Take 500 mcg by mouth daily.    [provider]  vitamin E (VITAMIN E) 400 UNIT capsule Take 400 Units by mouth 2 (two) times daily. Reported on 12/19/2015    [provider]    Family History Family History  Problem Relation Age of Onset  . Pancreatic cancer Mother   . Breast cancer Sister   . Epilepsy Brother   . Adrenal disorder Neg Hx     Social History Social History  Substance Use Topics  . Smoking status: Never Smoker  . Smokeless tobacco: Never Used  . Alcohol use No     Allergies   Bee venom; Linzess [linaclotide]; Molds & smuts;  and Seasonal ic [cholestatin]   Review of Systems Review of Systems  All other systems reviewed and are negative.    Physical Exam Updated Vital Signs BP (!) 151/92 (BP Location: Left Arm)   Pulse 83   Temp 98.3 F (36.8 C) (Oral)   Resp 18   SpO2 94%   Physical Exam  Nursing note and vitals reviewed.  66 year old male, resting comfortably and in no acute distress. Vital signs are is significant for hypertension. Oxygen saturation is 94%, which is normal. Head is normocephalic and atraumatic. PERRLA, EOMI. Oropharynx is clear. Neck is nontender and supple without adenopathy or JVD. Back is nontender and there is no CVA tenderness. Lungs are clear without rales, wheezes, or rhonchi. Chest is nontender. Heart has regular rate  and rhythm without murmur. Abdomen: Laparoscopy scars are healing appropriate without sign of infection. Abdomen is soft with mild tenderness across the lower abdomen. Peristalsis is normal. Genitalia:Uncircumcised penis. No phimosis or paraphimosis. Ecchymosis is noted on the right scrotal wall. Right testicle is mildly enlarged compared with the left, but is soft. There is tenderness to palpation of the right testicle. No definite scrotal mass. Extremities have no cyanosis or edema, full range of motion is present. Skin is warm and dry without rash. Neurologic: Mental status is normal, cranial nerves are intact, there are no motor or sensory deficits.  ED Treatments / Results  Labs (all labs ordered are listed, but only abnormal results are displayed) Labs Reviewed  CBC WITH DIFFERENTIAL/PLATELET - Abnormal; Notable for the following:       Result Value   Platelets 122 (*)    All other components within normal limits  COMPREHENSIVE METABOLIC PANEL - Abnormal; Notable for the following:    Glucose, Bld 108 (*)    Total Bilirubin 1.3 (*)    All other components within normal limits  URINALYSIS, ROUTINE W REFLEX MICROSCOPIC    Radiology US  Scrotum  Result Date: 07/23/2017 CLINICAL DATA:  66 year old with testicular pain. Status post recent hernia repair. EXAM: SCROTAL ULTRASOUND DOPPLER ULTRASOUND OF THE TESTICLES TECHNIQUE: Complete ultrasound examination of the testicles, epididymis, and other scrotal structures was performed. Color and spectral Doppler ultrasound were also utilized to evaluate blood flow to the testicles. COMPARISON:  None. FINDINGS: Right testicle Measurements: 4.6 x 2.5 x 3.7 cm. Homogeneous echogenicity. Normal blood flow. No mass or microlithiasis visualized. Left testicle Measurements: 4.9 x 2.3 x 2.6 cm. Homogeneous echogenicity. Normal blood flow. No mass or microlithiasis visualized. Right epididymis: Epididymal head cyst measuring 5 mm. Normal in size. Left epididymis:  Normal in size and appearance. Hydrocele:  None visualized. Varicocele:  None visualized. Pulsed Doppler interrogation of both testes demonstrates normal low resistance arterial and venous waveforms bilaterally. IMPRESSION: Small right epididymal head cyst. Otherwise normal scrotal ultrasound with Doppler. Electronically Signed   By: Jeb Levering M.D.   On: 07/23/2017 05:05   [place] Scrotom Doppler  Result Date: 07/23/2017 CLINICAL DATA:  66 year old with testicular pain. Status post recent hernia repair. EXAM: SCROTAL ULTRASOUND DOPPLER ULTRASOUND OF THE TESTICLES TECHNIQUE: Complete ultrasound examination of the testicles, epididymis, and other scrotal structures was performed. Color and spectral Doppler ultrasound were also utilized to evaluate blood flow to the testicles. COMPARISON:  None. FINDINGS: Right testicle Measurements: 4.6 x 2.5 x 3.7 cm. Homogeneous echogenicity. Normal blood flow. No mass or microlithiasis visualized. Left testicle Measurements: 4.9 x 2.3 x 2.6 cm. Homogeneous echogenicity. Normal blood flow. No mass or microlithiasis visualized. Right epididymis: Epididymal head cyst measuring 5 mm. Normal in size. Left epididymis:   Normal in size and appearance. Hydrocele:  None visualized. Varicocele:  None visualized. Pulsed Doppler interrogation of both testes demonstrates normal low resistance arterial and venous waveforms bilaterally. IMPRESSION: Small right epididymal head cyst. Otherwise normal scrotal ultrasound with Doppler. Electronically Signed   By: Jeb Levering M.D.   On: 07/23/2017 05:05    Procedures Procedures (including critical care time)  Medications Ordered in ED Medications - No data to display   Initial Impression / Assessment and Plan / ED Course  I have reviewed the triage vital signs and the nursing notes.  Pertinent labs & imaging results that were available during my care of the patient were reviewed by me and considered in my medical decision making (  see chart for details).  Scrotal ecchymosis which is probably benign. Right testicular pain of uncertain cause. Will get scrotal ultrasound and Doppler.   Laboratory workup shows mild thrombocytopenia which is unchanged from last CBC. Ultrasound shows good blood flow, no evidence of epididymitis or varicocele or hydrocele. Small epididymal cyst is not clinically significant. Patient was advised of these findings, sent home to resume routine postoperative care. Follow-up with his surgeon as scheduled.  Final Clinical Impressions(s) / ED Diagnoses   Final diagnoses:  Testicular pain, right  Thrombocytopenia [facility])    New Prescriptions New Prescriptions   No medications on file     Delora Fuel, MD 85/02/77 [phone]

## 2017-07-23 NOTE — Discharge Instructions (Signed)
Your ultrasound shows that your testicles are getting good blood flow, and there is no mass or inflammation. The discoloration is from blood tracking down into the scrotum. That will take several weeks to go away. Resume routine post-op care. Return if pain is getting worse, you start vomiting, or start running a fever.

## 2017-07-23 NOTE — ED Triage Notes (Signed)
Pt reports hernia repair Monday, was discharged after 24 hours. States he told his doctor that he was not ready to be discharged but they did anyway.  States around 0100 tonight, he felt "something pulling" between his legs, when he checked he noticed his R testicle is "black" and swollen.  Bruising noted on his R testicle and mild swelling.  He is unsure if it got hurt during surgery or when he was moving around tonight.  Pt still has his old armband from Monday admission, states he lives alone and does not want to take it off just in case something happens to him, "they will know where I have been."  Made him aware that the new armband in his L wrist has the same MRN number.  Pt is adamant about not taking the old one.

## 2017-07-31 NOTE — Discharge Summary (Signed)
Physician Discharge Summary  Kristopher Gay MWN:[id number] DOB: [birth year] DOA: 07/20/2017  PCP: [name], MD  Admit date: 07/20/2017 Discharge date: 07/31/2017  Recommendations for Outpatient Follow-up:  1.  (include homehealth, outpatient follow-up instructions, specific recommendations for PCP to follow-up on, etc.)  Follow-up Information    Aiven Kampe, [name], MD In 3 weeks.   Specialty:  General Surgery Contact information: [place] [address] [phone]          Discharge Diagnoses:  Active Problems:   Hernia of abdominal wall   Surgical Procedure: lap inguinal hernia repair  Discharge Condition: Good Disposition: Home  Diet recommendation: high fiber diet   Hospital Course:  66 yo male underwent lap inguinal hernia. Due to lack of ride he stayed overnight, was watched the next day due to pain, worked with PT to ensure he was steady on his feet and then was discharged home  Discharge Instructions  Discharge Instructions    Call MD for:  difficulty breathing, headache or visual disturbances    Complete by:  As directed    Call MD for:  hives    Complete by:  As directed    Call MD for:  persistant nausea and vomiting    Complete by:  As directed    Call MD for:  redness, tenderness, or signs of infection (pain, swelling, redness, odor or green/yellow discharge around incision site)    Complete by:  As directed    Call MD for:  severe uncontrolled pain    Complete by:  As directed    Call MD for:  temperature >100.4    Complete by:  As directed    Diet - low sodium heart healthy    Complete by:  As directed    Discharge wound care:    Complete by:  As directed    Remove bandages tomorrow. Steristrips will stay on for 2-3 weeks. Ok to Games developer.  Recommend ice to bilateral groins for pain and swelling prevention   Increase activity slowly    Complete by:  As directed      Allergies as of 07/21/2017       Reactions   Bee Venom Swelling   Linzess [linaclotide] Other (See Comments)   Abdominal pain   Molds & Smuts    Seasonal Ic [cholestatin]    Environmental Allergies      Medication List    TAKE these medications   ADVAIR DISKUS 500-50 MCG/DOSE Aepb Generic drug:  Fluticasone-Salmeterol INHALE 1 PUFF BY MOUTH TWICE DAILY, THEN RINSE MOUTH   ascorbic acid 1000 MG tablet Commonly known as:  VITAMIN C Take 1,000 mg by mouth 2 (two) times daily.   CALCIUM 1200+D3 PO Take 1 tablet by mouth 2 (two) times daily.   celecoxib 200 MG capsule Commonly known as:  CELEBREX TAKE 1 CAPSULE(200 MG) BY MOUTH DAILY   DHEA 25 MG Caps Take 1 capsule by mouth daily. Reported on 12/19/2015   docusate sodium 100 MG capsule Commonly known as:  COLACE Take 1 capsule (100 mg total) by mouth 2 (two) times daily.   EPIPEN 2-PAK 0.3 mg/0.3 mL Soaj injection Generic drug:  EPINEPHrine Inject 0.3 mg into the muscle once. Reported on 12/13/2015   Fish Oil 1000 MG Caps Take 1 capsule by mouth 2 (two) times daily. Reported on 12/19/2015   GLUCOSAMINE 1500 COMPLEX Caps Take 1 capsule by mouth 2 (two) times daily. Reported on 12/19/2015   ibuprofen 800  MG tablet Commonly known as:  ADVIL,MOTRIN Take 1 tablet (800 mg total) by mouth every 8 (eight) hours as needed.   montelukast 10 MG tablet Commonly known as:  SINGULAIR Take 10 mg by mouth at bedtime.   multivitamin tablet Take 1 tablet by mouth daily. Reported on 12/19/2015   oxyCODONE 5 MG immediate release tablet Commonly known as:  Oxy IR/ROXICODONE Take 1-2 tablets (5-10 mg total) by mouth every 6 (six) hours as needed for severe pain.   vitamin B-12 500 MCG tablet Commonly known as:  CYANOCOBALAMIN Take 500 mcg by mouth daily.   Vitamin D3 2000 units Tabs Take 1 tablet by mouth 2 (two) times daily.   vitamin E 400 UNIT capsule Generic drug:  vitamin E Take 400 Units by mouth 2 (two) times daily. Reported on 12/19/2015             Discharge Care Instructions        Start     Ordered   07/20/17 0000  Discharge wound care:    Comments:  Remove bandages tomorrow. Steristrips will stay on for 2-3 weeks. Ok to Games developer.  Recommend ice to bilateral groins for pain and swelling prevention   07/20/17 1549     Follow-up Information    Braulio Kiedrowski, [name], MD In 3 weeks.   Specialty:  General Surgery Contact information: [place] [address] [phone]            The results of significant diagnostics from this hospitalization (including imaging, microbiology, ancillary and laboratory) are listed below for reference.    Significant Diagnostic Studies: US Scrotum  Result Date: 07/23/2017 CLINICAL DATA:  66 year old with testicular pain. Status post recent hernia repair. EXAM: SCROTAL ULTRASOUND DOPPLER ULTRASOUND OF THE TESTICLES TECHNIQUE: Complete ultrasound examination of the testicles, epididymis, and other scrotal structures was performed. Color and spectral Doppler ultrasound were also utilized to evaluate blood flow to the testicles. COMPARISON:  None. FINDINGS: Right testicle Measurements: 4.6 x 2.5 x 3.7 cm. Homogeneous echogenicity. Normal blood flow. No mass or microlithiasis visualized. Left testicle Measurements: 4.9 x 2.3 x 2.6 cm. Homogeneous echogenicity. Normal blood flow. No mass or microlithiasis visualized. Right epididymis: Epididymal head cyst measuring 5 mm. Normal in size. Left epididymis:  Normal in size and appearance. Hydrocele:  None visualized. Varicocele:  None visualized. Pulsed Doppler interrogation of both testes demonstrates normal low resistance arterial and venous waveforms bilaterally. IMPRESSION: Small right epididymal head cyst. Otherwise normal scrotal ultrasound with Doppler. Electronically Signed   By: Jeb Levering M.D.   On: 07/23/2017 05:05   [place] Scrotom Doppler  Result Date: 07/23/2017 CLINICAL DATA:  66 year old with testicular  pain. Status post recent hernia repair. EXAM: SCROTAL ULTRASOUND DOPPLER ULTRASOUND OF THE TESTICLES TECHNIQUE: Complete ultrasound examination of the testicles, epididymis, and other scrotal structures was performed. Color and spectral Doppler ultrasound were also utilized to evaluate blood flow to the testicles. COMPARISON:  None. FINDINGS: Right testicle Measurements: 4.6 x 2.5 x 3.7 cm. Homogeneous echogenicity. Normal blood flow. No mass or microlithiasis visualized. Left testicle Measurements: 4.9 x 2.3 x 2.6 cm. Homogeneous echogenicity. Normal blood flow. No mass or microlithiasis visualized. Right epididymis: Epididymal head cyst measuring 5 mm. Normal in size. Left epididymis:  Normal in size and appearance. Hydrocele:  None visualized. Varicocele:  None visualized. Pulsed Doppler interrogation of both testes demonstrates normal low resistance arterial and venous waveforms bilaterally. IMPRESSION: Small right epididymal head cyst. Otherwise normal scrotal ultrasound with  Doppler. Electronically Signed   By: Jeb Levering M.D.   On: 07/23/2017 05:05    Labs: Basic Metabolic Panel: No results for input(s): NA, K, CL, CO2, GLUCOSE, BUN, CREATININE, CALCIUM, MG, PHOS in the last 168 hours. Liver Function Tests: No results for input(s): AST, ALT, ALKPHOS, BILITOT, PROT, ALBUMIN in the last 168 hours.  CBC: No results for input(s): WBC, NEUTROABS, HGB, HCT, MCV, PLT in the last 168 hours.  CBG: No results for input(s): GLUCAP in the last 168 hours.  Active Problems:   Hernia of abdominal wall   Time coordinating discharge: 65min

## 2017-08-12 ENCOUNTER — Other Ambulatory Visit: Payer: Self-pay | Admitting: Physical Medicine & Rehabilitation

## 2017-08-12 ENCOUNTER — Other Ambulatory Visit: Payer: Self-pay

## 2017-08-12 MED ORDER — MONTELUKAST SODIUM 10 MG PO TABS: 10.0000 mg | ORAL_TABLET | Freq: Every day | ORAL | 2 refills | Status: DC

## 2017-08-12 NOTE — Telephone Encounter (Signed)
Recieved electronic medication refill request for celecoxib, no mention in last note of continuing this medication, please advise

## 2017-08-13 ENCOUNTER — Ambulatory Visit (INDEPENDENT_AMBULATORY_CARE_PROVIDER_SITE_OTHER): Payer: Medicare Other | Admitting: *Deleted

## 2017-08-13 ENCOUNTER — Telehealth: Payer: Self-pay | Admitting: *Deleted

## 2017-08-13 DIAGNOSIS — J309 Allergic rhinitis, unspecified: Secondary | ICD-10-CM

## 2017-08-13 NOTE — Telephone Encounter (Signed)
Can patient get flu vaccine from our office or should he see his PCP? Please advise.

## 2017-08-13 NOTE — Telephone Encounter (Signed)
As long as his insurance allows him to receive the flu vaccine in our clinic that would be fine.

## 2017-08-13 NOTE — Telephone Encounter (Signed)
fyi

## 2017-08-17 NOTE — Telephone Encounter (Signed)
Left message for patient to call office.  

## 2017-08-17 NOTE — Telephone Encounter (Signed)
Informed patient okay for him to receive his flu shot.

## 2017-08-18 ENCOUNTER — Encounter: Payer: Self-pay | Admitting: *Deleted

## 2017-08-18 ENCOUNTER — Ambulatory Visit (INDEPENDENT_AMBULATORY_CARE_PROVIDER_SITE_OTHER): Payer: Medicare Other | Admitting: *Deleted

## 2017-08-18 DIAGNOSIS — Z23 Encounter for immunization: Secondary | ICD-10-CM | POA: Diagnosis not present

## 2017-08-18 NOTE — Progress Notes (Signed)
This encounter was created in error - please disregard.

## 2017-08-18 NOTE — Progress Notes (Signed)
Patient came in to get flu shot. Administered flu shot

## 2017-08-20 ENCOUNTER — Ambulatory Visit (INDEPENDENT_AMBULATORY_CARE_PROVIDER_SITE_OTHER): Payer: Medicare Other | Admitting: *Deleted

## 2017-08-20 DIAGNOSIS — J309 Allergic rhinitis, unspecified: Secondary | ICD-10-CM

## 2017-08-27 ENCOUNTER — Ambulatory Visit (INDEPENDENT_AMBULATORY_CARE_PROVIDER_SITE_OTHER): Payer: Medicare Other | Admitting: Allergy

## 2017-08-27 ENCOUNTER — Encounter: Payer: Self-pay | Admitting: Allergy

## 2017-08-27 ENCOUNTER — Ambulatory Visit: Payer: Medicare Other | Admitting: Allergy

## 2017-08-27 VITALS — BP 120/74 | HR 68 | Resp 20

## 2017-08-27 DIAGNOSIS — J4540 Moderate persistent asthma, uncomplicated: Secondary | ICD-10-CM | POA: Diagnosis not present

## 2017-08-27 DIAGNOSIS — J309 Allergic rhinitis, unspecified: Secondary | ICD-10-CM

## 2017-08-27 NOTE — Progress Notes (Signed)
Follow-up Note  RE: Kristopher Gay MRN: [id number] DOB: 1951/01/06 Date of Office Visit: 08/27/2017   History of present illness: Kristopher Gay is a 66 y.o. male presenting today for follow-up of allergic rhinitis.  He was last see in the office on 02/26/17 by myself.  He also has asthma and is followed by Dr. Annamaria Boots and has an appointment on 09/07/17.  He continues on advair.     With his allergies he is on allergen immunotherapy and did have to take a break around early October due to having ex-lap surgery on 07/20/17.  He reports they found adhesions of his colon to abdominal wall as well as repaired several hernias.  He was able to resume his injections in late October.  He does use flonase daily and uses Allegra as needed.  He continues on singulair daily.  He does report having some throat irritation and nasal drainage especially with laying flat.  He denies any reflux symptoms.       Review of systems: Review of Systems  Constitutional: Negative for chills, fever and malaise/fatigue.  HENT: Positive for congestion. Negative for ear discharge, ear pain, nosebleeds, sinus pain, sore throat and tinnitus.   Eyes: Negative for pain, discharge and redness.  Respiratory: Negative for cough, shortness of breath and wheezing.   Cardiovascular: Negative for chest pain.  Gastrointestinal: Positive for abdominal pain. Negative for constipation, diarrhea, nausea and vomiting.  Musculoskeletal: Negative for joint pain.  Skin: Negative for itching and rash.  Neurological: Negative for headaches.    All other systems negative unless noted above in HPI  Past medical/social/surgical/family history have been reviewed and are unchanged unless specifically indicated below.  No changes  Medication List: Allergies as of 08/27/2017      Reactions   Bee Venom Swelling   Linzess [linaclotide] Other (See Comments)   Abdominal pain   Molds & Smuts    Seasonal Ic [cholestatin]    Environmental Allergies       Medication List       Accurate as of 08/27/17  5:32 PM. Always use your most recent med list.          ADVAIR DISKUS 500-50 MCG/DOSE Aepb Generic drug:  Fluticasone-Salmeterol INHALE 1 PUFF BY MOUTH TWICE DAILY, THEN RINSE MOUTH   ascorbic acid 1000 MG tablet Commonly known as:  VITAMIN C Take 1,000 mg by mouth 2 (two) times daily.   CALCIUM 1200+D3 PO Take 1 tablet by mouth 2 (two) times daily.   celecoxib 200 MG capsule Commonly known as:  CELEBREX TAKE 1 CAPSULE(200 MG) BY MOUTH DAILY   DHEA 25 MG Caps Take 1 capsule by mouth daily. Reported on 12/19/2015   EPIPEN 2-PAK 0.3 mg/0.3 mL Soaj injection Generic drug:  EPINEPHrine Inject 0.3 mg into the muscle once. Reported on 12/13/2015   Fish Oil 1000 MG Caps Take 1 capsule by mouth 2 (two) times daily. Reported on 12/19/2015   GLUCOSAMINE 1500 COMPLEX Caps Take 1 capsule by mouth 2 (two) times daily. Reported on 12/19/2015   ibuprofen 800 MG tablet Commonly known as:  ADVIL,MOTRIN Take 1 tablet (800 mg total) by mouth every 8 (eight) hours as needed.   montelukast 10 MG tablet Commonly known as:  SINGULAIR Take 1 tablet (10 mg total) by mouth at bedtime.   multivitamin tablet Take 1 tablet by mouth daily. Reported on 12/19/2015   oxyCODONE 5 MG immediate release tablet Commonly known as:  Oxy IR/ROXICODONE Take 1-2 tablets (5-10  mg total) by mouth every 6 (six) hours as needed for severe pain.   polyethylene glycol packet Commonly known as:  MIRALAX / GLYCOLAX Take 17 g by mouth daily as needed for mild constipation.   vitamin B-12 500 MCG tablet Commonly known as:  CYANOCOBALAMIN Take 500 mcg by mouth daily.   Vitamin D3 2000 units Tabs Take 1 tablet by mouth 2 (two) times daily.   vitamin E 400 UNIT capsule Generic drug:  vitamin E Take 400 Units by mouth 2 (two) times daily. Reported on 12/19/2015       Known medication allergies: Allergies  Allergen Reactions  . Bee Venom Swelling  .  Linzess [Linaclotide] Other (See Comments)    Abdominal pain  . Molds & Smuts   . Seasonal Ic [Cholestatin]     Environmental Allergies     Physical examination: Blood pressure 120/74, pulse 68, resp. rate 20.  General: Alert, interactive, in no acute distress. HEENT: PERRLA, TMs pearly gray, turbinates mildly edematous without discharge, post-pharynx non erythematous. Neck: Supple without lymphadenopathy. Lungs: Clear to auscultation without wheezing, rhonchi or rales. {no increased work of breathing. CV: Normal S1, S2 without murmurs. Abdomen: Nondistended, nontender. Skin: Warm and dry, without lesions or rashes. Extremities:  No clubbing, cyanosis or edema. Neuro:   Grossly intact.  Diagnositics/Labs:  Spirometry: FEV1: 2.29L  62%, FVC: 2.78L  57%  Assessment and plan:      Allergic rhinitis - continue allergen immunotherapy - continue singulair 10mg  daily  -  will provide with Dymista sample to use for nasal congestion and drainage.  Use 1 spray each nostril twice a day.  Let us know if it helps decrease nasal drainage and throat irritation symptoms.   Hold your Flonase while using Dymista.  -  continue your Allegra 180mg  daily - resume Flonase 2 sprays each nostril daily once you complete the Dymista sample  Asthma - he is followed by Dr. Annamaria Boots with upcoming appointment - continue Advair and as needed albuterol and follow-up with Dr. Annamaria Boots for your asthma care   Follow-up 6 months or sooner if needed  I appreciate the opportunity to take part in Kendyl's care. Please do not hesitate to contact me with questions.  Sincerely,   Prudy Feeler, MD Allergy/Immunology [facility]

## 2017-08-27 NOTE — Patient Instructions (Addendum)
-   continue allergen immunotherapy (allergy shots) - continue singulair 10mg  daily  - will provide with Dymista sample to use for nasal congestion and drainage.  Use 1 spray each nostril twice a day.  Let us know if it helps decrease nasal drainage and throat irritation symptoms.   Hold your Flonase while using Dymista.  - continue your Allegra 180mg  daily - resume Flonase 2 sprays each nostril daily once you complete the Dymista sample  - continue Advair and as needed albuterol and follow-up with Dr. Annamaria Boots for your asthma care   Follow-up 6 months or sooner if needed

## 2017-09-03 ENCOUNTER — Ambulatory Visit (INDEPENDENT_AMBULATORY_CARE_PROVIDER_SITE_OTHER): Payer: Medicare Other | Admitting: *Deleted

## 2017-09-03 DIAGNOSIS — J309 Allergic rhinitis, unspecified: Secondary | ICD-10-CM | POA: Diagnosis not present

## 2017-09-11 ENCOUNTER — Ambulatory Visit (INDEPENDENT_AMBULATORY_CARE_PROVIDER_SITE_OTHER): Payer: Medicare Other

## 2017-09-11 DIAGNOSIS — J309 Allergic rhinitis, unspecified: Secondary | ICD-10-CM | POA: Diagnosis not present

## 2017-09-21 ENCOUNTER — Encounter: Payer: Self-pay | Admitting: Internal Medicine

## 2017-09-21 ENCOUNTER — Ambulatory Visit: Payer: Medicare Other | Admitting: Internal Medicine

## 2017-09-21 VITALS — BP 128/78 | HR 67 | Ht 73.0 in | Wt 251.1 lb

## 2017-09-21 DIAGNOSIS — J3089 Other allergic rhinitis: Secondary | ICD-10-CM

## 2017-09-21 DIAGNOSIS — K219 Gastro-esophageal reflux disease without esophagitis: Secondary | ICD-10-CM

## 2017-09-21 DIAGNOSIS — J449 Chronic obstructive pulmonary disease, unspecified: Secondary | ICD-10-CM

## 2017-09-21 DIAGNOSIS — J302 Other seasonal allergic rhinitis: Secondary | ICD-10-CM

## 2017-09-21 MED ORDER — FLUTICASONE PROPIONATE 50 MCG/ACT NA SUSP: 2.0000 | Freq: Every day | NASAL | 12 refills | Status: DC

## 2017-09-21 MED ORDER — TIOTROPIUM BROMIDE-OLODATEROL 2.5-2.5 MCG/ACT IN AERS: 2.0000 | INHALATION_SPRAY | Freq: Every day | RESPIRATORY_TRACT | 0 refills | Status: DC

## 2017-09-21 NOTE — Progress Notes (Signed)
---------------  Patient ID: Kristopher Gay, male    DOB: 06-25-1951, 66 y.o.   MRN: [id number]  HPI  M never smoker followed for allergic rhinitis and allergic asthma/ bronchitis, complicated by chronic musculoskeletal pain after an injury, GERD/ reflux Barium Swallow 04/04/14.2 Moderate gastroesophageal reflux to the level of mid esophagus CT chest 01/11/2016-IMPRESSION: 1. 2.2 cm left adrenal adenoma.ower lobe  2. New bronchial wall thickening and nodular opacities in the right likely reflecting bronchiolitis Office Spirometry 09/23/2016-mild restriction of exhaled volume. FVC 3.75/71%, FEV1 3.01/76%, ratio 0.80, FEF 25-75% 2.96/95%. ------------------------------------------------------------------------------------------------------------------- 03/20/17-66 year old male never smoker followed for Allergic rhinitis, allergic asthma/bronchitis, complicated by chronic musculoskeletal pain after injury, GERD/reflux, grade 1 diastolic dysfunction, degenerative disc disease FOLLOWS FOR:Pt states he was stopped on allergy vaccine and retested-now is on dust mites and mold vaccine only( Dr Neldon Mc) . Pt is having sob and cough as well. Pt was put on Singulair 10mg  qd since December-stopped due to side effects. Allergy Profile 10/16/16- Neg-no elevation for local environmental allergens Questionable neurologic problem related to Singulair so he stopped it. Some wheeze and frequent nonproductive cough. Continues Advair 500, occasional rescue. He complains of an umbilical hernia and I suggested he revisit GI.  09/21/17- 66 year old male never smoker followed for Allergic rhinitis, allergic asthma/bronchitis, complicated by chronic musculoskeletal pain after injury, GERD/reflux, grade 1 diastolic dysfunction, degenerative disc disease ---cough, shortness of breath Complains of nasal drip and chest congestion which are chronic in some with weather.  Has a sample of Dymista nasal spray from his allergist but has  not tried it.  UTD flu shot  ROS-see HPI + = positive Constitutional:   No-   weight loss, night sweats, fevers, chills, fatigue, lassitude. HEENT:   No-  headaches, difficulty swallowing, tooth/dental problems, +sore throat,       sneezing, itching, ear ache, + nasal congestion, +post nasal drip,  CV:  chest pain, no-orthopnea, PND, swelling in lower extremities, anasarca,  dizziness, palpitations Resp: No-   shortness of breath with exertion or at rest.             + productive cough,  + non-productive cough,  No- coughing up of blood.              No-   change in color of mucus.  +wheezing.   Skin: No-   rash or lesions. GI:  No-   heartburn, indigestion, abdominal pain, nausea, vomiting,  GU: . MS: +  joint pain or swelling.  No- decreased range of motion.  + back pain. Neuro-     nothing unusual Psych:  No- change in mood or affect. + depression or anxiety.  No memory loss.  OBJ- Physical Exam General- Alert, Oriented, Affect-appropriate, Distress- none acute. Big/ muscular Skin- rash-none, lesions- none, excoriation- none Lymphadenopathy- none Head- atraumatic            Eyes- Gross vision intact, PERRLA, conjunctivae and secretions clear            Ears- Hearing, canals-normal            Nose- Clear, no-Septal dev, mucus, polyps, erosion, perforation             Throat- Mallampati II , mucosa clear(throat lozenges) , drainage- none, tonsils- atrophic Neck- flexible , trachea midline, no stridor , thyroid nl, carotid no bruit Chest - symmetrical excursion , unlabored           Heart/CV- RRR , no murmur , no gallop  , no rub,  nl s1 s2                           - JVD- none , edema- none, stasis changes- none, varices- none           Lung- + coarse wheeze bilaterally/unlabored, dullness-none, rub- none, cough-none           Chest wall- not obviously tender Abd-  Br/ Gen/ Rectal- Not done, not indicated Extrem- cyanosis- none, clubbing, none, atrophy- none, strength- muscular. +  Brace L lower leg, + cane Neuro- grossly intact to observation

## 2017-09-21 NOTE — Patient Instructions (Addendum)
Sample Stiolto Respimat inhaler    Inhale 2 puffs, once daily     Try this instead of Advair. When the sample runs out, go back to Advair for comparison.  Script printed refilling Fluticasone ( generic for Flonase) nasal spray. Instead, you could choose to use the sample of Dymista nasal spray your allergist gave you.  Printout of medication list and requested imaging report  Please call if we can help

## 2017-09-24 ENCOUNTER — Ambulatory Visit (INDEPENDENT_AMBULATORY_CARE_PROVIDER_SITE_OTHER): Payer: Medicare Other | Admitting: *Deleted

## 2017-09-24 DIAGNOSIS — J309 Allergic rhinitis, unspecified: Secondary | ICD-10-CM | POA: Diagnosis not present

## 2017-09-25 NOTE — Assessment & Plan Note (Signed)
We continue to suspect reflux is important. Plan-Stiolto trial

## 2017-09-25 NOTE — Assessment & Plan Note (Signed)
He is working with his allergist. Plan-refill fluticasone nasal spray

## 2017-09-25 NOTE — Assessment & Plan Note (Signed)
Continue emphasized reflux precautions

## 2017-09-30 NOTE — Progress Notes (Signed)
EXP 10/02/18

## 2017-10-01 ENCOUNTER — Ambulatory Visit (INDEPENDENT_AMBULATORY_CARE_PROVIDER_SITE_OTHER): Payer: Medicare Other | Admitting: *Deleted

## 2017-10-01 DIAGNOSIS — J309 Allergic rhinitis, unspecified: Secondary | ICD-10-CM

## 2017-10-02 DIAGNOSIS — J3089 Other allergic rhinitis: Secondary | ICD-10-CM | POA: Diagnosis not present

## 2017-10-09 ENCOUNTER — Ambulatory Visit (INDEPENDENT_AMBULATORY_CARE_PROVIDER_SITE_OTHER): Payer: Medicare Other

## 2017-10-09 DIAGNOSIS — J309 Allergic rhinitis, unspecified: Secondary | ICD-10-CM

## 2017-10-15 ENCOUNTER — Ambulatory Visit (INDEPENDENT_AMBULATORY_CARE_PROVIDER_SITE_OTHER): Payer: Medicare Other | Admitting: *Deleted

## 2017-10-15 DIAGNOSIS — J309 Allergic rhinitis, unspecified: Secondary | ICD-10-CM | POA: Diagnosis not present

## 2017-10-22 ENCOUNTER — Ambulatory Visit (INDEPENDENT_AMBULATORY_CARE_PROVIDER_SITE_OTHER): Payer: Medicare Other | Admitting: *Deleted

## 2017-10-22 DIAGNOSIS — J309 Allergic rhinitis, unspecified: Secondary | ICD-10-CM | POA: Diagnosis not present

## 2017-10-29 ENCOUNTER — Ambulatory Visit (INDEPENDENT_AMBULATORY_CARE_PROVIDER_SITE_OTHER): Payer: Medicare Other | Admitting: *Deleted

## 2017-10-29 DIAGNOSIS — J309 Allergic rhinitis, unspecified: Secondary | ICD-10-CM

## 2017-11-05 ENCOUNTER — Ambulatory Visit (INDEPENDENT_AMBULATORY_CARE_PROVIDER_SITE_OTHER): Payer: Medicare Other | Admitting: *Deleted

## 2017-11-05 DIAGNOSIS — J309 Allergic rhinitis, unspecified: Secondary | ICD-10-CM

## 2017-11-12 ENCOUNTER — Ambulatory Visit (INDEPENDENT_AMBULATORY_CARE_PROVIDER_SITE_OTHER): Payer: Medicare Other

## 2017-11-12 DIAGNOSIS — J309 Allergic rhinitis, unspecified: Secondary | ICD-10-CM

## 2017-11-17 ENCOUNTER — Other Ambulatory Visit: Payer: Self-pay | Admitting: *Deleted

## 2017-11-17 ENCOUNTER — Telehealth: Payer: Self-pay | Admitting: Internal Medicine

## 2017-11-17 MED ORDER — MONTELUKAST SODIUM 10 MG PO TABS: 10.0000 mg | ORAL_TABLET | Freq: Every day | ORAL | 5 refills | Status: DC

## 2017-11-17 MED ORDER — FLUTICASONE-SALMETEROL 500-50 MCG/DOSE IN AEPB: INHALATION_SPRAY | RESPIRATORY_TRACT | 5 refills | Status: DC

## 2017-11-17 NOTE — Telephone Encounter (Signed)
Spoke with patient. He was requesting a refill on his Advair Diskus 500/50. He wishes to have this sent to [facility] on [address] [place] patient that I would send in RX for him. He verbalized understanding. Nothing else needed at time of call.

## 2017-11-19 ENCOUNTER — Ambulatory Visit (INDEPENDENT_AMBULATORY_CARE_PROVIDER_SITE_OTHER): Payer: Medicare Other | Admitting: *Deleted

## 2017-11-19 DIAGNOSIS — J309 Allergic rhinitis, unspecified: Secondary | ICD-10-CM | POA: Diagnosis not present

## 2017-11-26 ENCOUNTER — Ambulatory Visit (INDEPENDENT_AMBULATORY_CARE_PROVIDER_SITE_OTHER): Payer: Medicare Other | Admitting: *Deleted

## 2017-11-26 DIAGNOSIS — J309 Allergic rhinitis, unspecified: Secondary | ICD-10-CM

## 2017-12-03 ENCOUNTER — Ambulatory Visit (INDEPENDENT_AMBULATORY_CARE_PROVIDER_SITE_OTHER): Payer: Medicare Other | Admitting: *Deleted

## 2017-12-03 DIAGNOSIS — J309 Allergic rhinitis, unspecified: Secondary | ICD-10-CM | POA: Diagnosis not present

## 2017-12-10 ENCOUNTER — Ambulatory Visit (INDEPENDENT_AMBULATORY_CARE_PROVIDER_SITE_OTHER): Payer: Medicare Other | Admitting: *Deleted

## 2017-12-10 DIAGNOSIS — J309 Allergic rhinitis, unspecified: Secondary | ICD-10-CM

## 2017-12-14 ENCOUNTER — Other Ambulatory Visit: Payer: Self-pay | Admitting: General Surgery

## 2017-12-14 DIAGNOSIS — R109 Unspecified abdominal pain: Secondary | ICD-10-CM

## 2017-12-17 ENCOUNTER — Ambulatory Visit (INDEPENDENT_AMBULATORY_CARE_PROVIDER_SITE_OTHER): Payer: Medicare Other | Admitting: *Deleted

## 2017-12-17 ENCOUNTER — Ambulatory Visit
Admission: RE | Admit: 2017-12-17 | Discharge: 2017-12-17 | Disposition: A | Discharge status: Home / Self Care | Payer: Medicare Other | Source: Ambulatory Visit | Attending: General Surgery | Admitting: General Surgery

## 2017-12-17 DIAGNOSIS — J309 Allergic rhinitis, unspecified: Secondary | ICD-10-CM | POA: Diagnosis not present

## 2017-12-17 DIAGNOSIS — R109 Unspecified abdominal pain: Secondary | ICD-10-CM

## 2017-12-17 MED ORDER — IOPAMIDOL (ISOVUE-300) INJECTION 61%
125.0000 mL | Freq: Once | INTRAVENOUS | Status: AC | PRN
  Administered 2017-12-17: 125 mL via INTRAVENOUS

## 2017-12-22 NOTE — Progress Notes (Signed)
VIALS EXP 12-23-18

## 2017-12-23 DIAGNOSIS — J3089 Other allergic rhinitis: Secondary | ICD-10-CM | POA: Diagnosis not present

## 2017-12-24 ENCOUNTER — Ambulatory Visit (INDEPENDENT_AMBULATORY_CARE_PROVIDER_SITE_OTHER): Payer: Medicare Other | Admitting: *Deleted

## 2017-12-24 DIAGNOSIS — J309 Allergic rhinitis, unspecified: Secondary | ICD-10-CM

## 2018-01-01 ENCOUNTER — Ambulatory Visit (INDEPENDENT_AMBULATORY_CARE_PROVIDER_SITE_OTHER): Payer: Medicare Other

## 2018-01-01 DIAGNOSIS — J309 Allergic rhinitis, unspecified: Secondary | ICD-10-CM

## 2018-01-07 ENCOUNTER — Ambulatory Visit (INDEPENDENT_AMBULATORY_CARE_PROVIDER_SITE_OTHER): Payer: Medicare Other | Admitting: *Deleted

## 2018-01-07 DIAGNOSIS — J309 Allergic rhinitis, unspecified: Secondary | ICD-10-CM

## 2018-01-14 ENCOUNTER — Ambulatory Visit (INDEPENDENT_AMBULATORY_CARE_PROVIDER_SITE_OTHER): Payer: Medicare Other

## 2018-01-14 DIAGNOSIS — J309 Allergic rhinitis, unspecified: Secondary | ICD-10-CM | POA: Diagnosis not present

## 2018-01-21 ENCOUNTER — Ambulatory Visit (INDEPENDENT_AMBULATORY_CARE_PROVIDER_SITE_OTHER): Payer: Medicare Other | Admitting: *Deleted

## 2018-01-21 DIAGNOSIS — J309 Allergic rhinitis, unspecified: Secondary | ICD-10-CM

## 2018-01-28 ENCOUNTER — Ambulatory Visit (INDEPENDENT_AMBULATORY_CARE_PROVIDER_SITE_OTHER): Payer: Medicare Other | Admitting: *Deleted

## 2018-01-28 DIAGNOSIS — J309 Allergic rhinitis, unspecified: Secondary | ICD-10-CM | POA: Diagnosis not present

## 2018-02-04 ENCOUNTER — Ambulatory Visit (INDEPENDENT_AMBULATORY_CARE_PROVIDER_SITE_OTHER): Payer: Medicare Other | Admitting: *Deleted

## 2018-02-04 DIAGNOSIS — J309 Allergic rhinitis, unspecified: Secondary | ICD-10-CM | POA: Diagnosis not present

## 2018-02-10 ENCOUNTER — Ambulatory Visit: Payer: Self-pay | Admitting: General Surgery

## 2018-02-11 ENCOUNTER — Ambulatory Visit (INDEPENDENT_AMBULATORY_CARE_PROVIDER_SITE_OTHER): Payer: Medicare Other | Admitting: *Deleted

## 2018-02-11 DIAGNOSIS — J309 Allergic rhinitis, unspecified: Secondary | ICD-10-CM | POA: Diagnosis not present

## 2018-02-11 NOTE — Pre-Procedure Instructions (Signed)
HUIE GHUMAN  02/11/2018      [facility] [address] [place] [address] Phone: [phone] Fax: [phone]    Your procedure is scheduled on April 25  Report to [place] at 0830 A.M.  Call this number if you have problems the morning of surgery:  [phone]   Remember:  Do not eat food or drink liquids after midnight.   Take these medicines the morning of surgery with A SIP OF WATER  fluticasone (FLONASE)  Fluticasone-Salmeterol (ADVAIR DISKUS) Tiotropium Bromide-Olodaterol (STIOLTO RESPIMAT)  7 days prior to surgery STOP taking any Aspirin(unless otherwise instructed by your surgeon), Aleve, Naproxen, Ibuprofen, Motrin, Advil, Goody's, BC's, all herbal medications, fish oil, and all vitamins    Do not wear jewelry  Do not wear lotions, powders, or cologne, or deodorant.  Men may shave face and neck.  Do not bring valuables to the hospital.  [facility] is not responsible for any belongings or valuables.  Contacts, dentures or bridgework may not be worn into surgery.  Leave your suitcase in the car.  After surgery it may be brought to your room.  For patients admitted to the hospital, discharge time will be determined by your treatment team.  Patients discharged the day of surgery will not be allowed to drive home.    Special instructions:   [facility]- Preparing For Surgery  Before surgery, you can play an important role. Because skin is not sterile, your skin needs to be as free of germs as possible. You can reduce the number of germs on your skin by washing with CHG (chlorahexidine gluconate) Soap before surgery.  CHG is an antiseptic cleaner which kills germs and bonds with the skin to continue killing germs even after washing.  Please do not use if you have an allergy to CHG or antibacterial soaps. If your skin becomes reddened/irritated  stop using the CHG.  Do not shave (including legs and underarms) for at least 48 hours prior to first CHG shower. It is OK to shave your face.  Please follow these instructions carefully.   1. Shower the NIGHT BEFORE SURGERY and the MORNING OF SURGERY with CHG.   2. If you chose to wash your hair, wash your hair first as usual with your normal shampoo.  3. After you shampoo, rinse your hair and body thoroughly to remove the shampoo.  4. Use CHG as you would any other liquid soap. You can apply CHG directly to the skin and wash gently with a scrungie or a clean washcloth.   5. Apply the CHG Soap to your body ONLY FROM THE NECK DOWN.  Do not use on open wounds or open sores. Avoid contact with your eyes, ears, mouth and genitals (private parts). Wash Face and genitals (private parts)  with your normal soap.  6. Wash thoroughly, paying special attention to the area where your surgery will be performed.  7. Thoroughly rinse your body with warm water from the neck down.  8. DO NOT shower/wash with your normal soap after using and rinsing off the CHG Soap.  9. Pat yourself dry with a CLEAN TOWEL.  10. Wear CLEAN PAJAMAS to bed the night before surgery, wear comfortable clothes the morning of surgery  11. Place CLEAN SHEETS on your bed the night of your first shower and DO NOT SLEEP WITH PETS.  Day of Surgery: Do not apply any deodorants/lotions. Please wear clean clothes to the hospital/surgery center.      Please read over the following fact sheets that you were given.

## 2018-02-12 ENCOUNTER — Encounter (HOSPITAL_COMMUNITY)
Admission: RE | Admit: 2018-02-12 | Discharge: 2018-02-12 | Disposition: A | Discharge status: Home / Self Care | Payer: Medicare Other | Source: Ambulatory Visit | Attending: General Surgery | Admitting: General Surgery

## 2018-02-12 ENCOUNTER — Encounter (HOSPITAL_COMMUNITY): Payer: Self-pay

## 2018-02-12 DIAGNOSIS — Z01812 Encounter for preprocedural laboratory examination: Secondary | ICD-10-CM | POA: Diagnosis not present

## 2018-02-12 LAB — BASIC METABOLIC PANEL
Anion gap: 10 (ref 5–15)
BUN: 16 mg/dL (ref 6–20)
CO2: 22 mmol/L (ref 22–32)
Calcium: 9.3 mg/dL (ref 8.9–10.3)
Chloride: 107 mmol/L (ref 101–111)
Creatinine, Ser: 1.08 mg/dL (ref 0.61–1.24)
GFR calc Af Amer: >60 mL/min (ref >60)
GFR calc non Af Amer: >60 mL/min (ref >60)
Glucose, Bld: 99 mg/dL (ref 65–99)
Potassium: 4.2 mmol/L (ref 3.5–5.1)
Sodium: 139 mmol/L (ref 135–145)

## 2018-02-12 LAB — CBC
HCT: 44.6 % (ref 39.0–52.0)
Hemoglobin: 15.2 g/dL (ref 13.0–17.0)
MCH: 30.6 pg (ref 26.0–34.0)
MCHC: 34.1 g/dL (ref 30.0–36.0)
MCV: 89.9 fL (ref 78.0–100.0)
Platelets: 146 10*3/uL — ABNORMAL LOW (ref 150–400)
RBC: 4.96 MIL/uL (ref 4.22–5.81)
RDW: 13 % (ref 11.5–15.5)
WBC: 7.4 10*3/uL (ref 4.0–10.5)

## 2018-02-12 MED ORDER — CHLORHEXIDINE GLUCONATE 4 % EX LIQD: 60.0000 mL | Freq: Once | CUTANEOUS | Status: DC

## 2018-02-18 ENCOUNTER — Ambulatory Visit (HOSPITAL_COMMUNITY): Payer: Medicare Other | Admitting: Vascular Surgery

## 2018-02-18 ENCOUNTER — Other Ambulatory Visit: Payer: Self-pay

## 2018-02-18 ENCOUNTER — Encounter (HOSPITAL_COMMUNITY): Payer: Self-pay | Admitting: *Deleted

## 2018-02-18 ENCOUNTER — Encounter (HOSPITAL_COMMUNITY)
Admission: RE | Disposition: A | Discharge status: Home / Self Care | Payer: Self-pay | Source: Ambulatory Visit | Attending: General Surgery

## 2018-02-18 ENCOUNTER — Observation Stay (HOSPITAL_COMMUNITY)
Admission: RE | Admit: 2018-02-18 | Discharge: 2018-02-23 | Disposition: A | Discharge status: Home / Self Care | Payer: Medicare Other | Source: Ambulatory Visit | Attending: General Surgery | Admitting: General Surgery

## 2018-02-18 ENCOUNTER — Ambulatory Visit (HOSPITAL_COMMUNITY): Payer: Medicare Other | Admitting: Certified Registered"

## 2018-02-18 DIAGNOSIS — Z7951 Long term (current) use of inhaled steroids: Secondary | ICD-10-CM | POA: Insufficient documentation

## 2018-02-18 DIAGNOSIS — Z888 Allergy status to other drugs, medicaments and biological substances status: Secondary | ICD-10-CM | POA: Insufficient documentation

## 2018-02-18 DIAGNOSIS — G629 Polyneuropathy, unspecified: Secondary | ICD-10-CM | POA: Insufficient documentation

## 2018-02-18 DIAGNOSIS — J449 Chronic obstructive pulmonary disease, unspecified: Secondary | ICD-10-CM | POA: Insufficient documentation

## 2018-02-18 DIAGNOSIS — Z91030 Bee allergy status: Secondary | ICD-10-CM | POA: Insufficient documentation

## 2018-02-18 DIAGNOSIS — F329 Major depressive disorder, single episode, unspecified: Secondary | ICD-10-CM | POA: Diagnosis not present

## 2018-02-18 DIAGNOSIS — I272 Pulmonary hypertension, unspecified: Secondary | ICD-10-CM | POA: Insufficient documentation

## 2018-02-18 DIAGNOSIS — I739 Peripheral vascular disease, unspecified: Secondary | ICD-10-CM | POA: Insufficient documentation

## 2018-02-18 DIAGNOSIS — Z86718 Personal history of other venous thrombosis and embolism: Secondary | ICD-10-CM | POA: Diagnosis not present

## 2018-02-18 DIAGNOSIS — W19XXXA Unspecified fall, initial encounter: Secondary | ICD-10-CM | POA: Insufficient documentation

## 2018-02-18 DIAGNOSIS — Z79899 Other long term (current) drug therapy: Secondary | ICD-10-CM | POA: Insufficient documentation

## 2018-02-18 DIAGNOSIS — R0602 Shortness of breath: Secondary | ICD-10-CM

## 2018-02-18 DIAGNOSIS — K429 Umbilical hernia without obstruction or gangrene: Secondary | ICD-10-CM | POA: Diagnosis present

## 2018-02-18 DIAGNOSIS — I480 Paroxysmal atrial fibrillation: Secondary | ICD-10-CM | POA: Diagnosis present

## 2018-02-18 DIAGNOSIS — I119 Hypertensive heart disease without heart failure: Secondary | ICD-10-CM | POA: Insufficient documentation

## 2018-02-18 DIAGNOSIS — Z791 Long term (current) use of non-steroidal anti-inflammatories (NSAID): Secondary | ICD-10-CM | POA: Diagnosis not present

## 2018-02-18 HISTORY — PX: UMBILICAL HERNIA REPAIR: SUR1181

## 2018-02-18 HISTORY — PX: UMBILICAL HERNIA REPAIR: SHX196

## 2018-02-18 LAB — CBC
HCT: 41.1 % (ref 39.0–52.0)
Hemoglobin: 14 g/dL (ref 13.0–17.0)
MCH: 31.1 pg (ref 26.0–34.0)
MCHC: 34.1 g/dL (ref 30.0–36.0)
MCV: 91.3 fL (ref 78.0–100.0)
Platelets: 123 10*3/uL — ABNORMAL LOW (ref 150–400)
RBC: 4.5 MIL/uL (ref 4.22–5.81)
RDW: 13 % (ref 11.5–15.5)
WBC: 10.3 10*3/uL (ref 4.0–10.5)

## 2018-02-18 LAB — CREATININE, SERUM
Creatinine, Ser: 1.12 mg/dL (ref 0.61–1.24)
GFR calc Af Amer: >60 mL/min (ref >60)
GFR calc non Af Amer: >60 mL/min (ref >60)

## 2018-02-18 SURGERY — REPAIR, HERNIA, UMBILICAL, LAPAROSCOPIC
Anesthesia: General | Site: Abdomen

## 2018-02-18 MED ORDER — BUPIVACAINE-EPINEPHRINE 0.25% -1:200000 IJ SOLN
INTRAMUSCULAR | Status: DC | PRN
  Administered 2018-02-18: 30 mL

## 2018-02-18 MED ORDER — ROCURONIUM BROMIDE 100 MG/10ML IV SOLN
INTRAVENOUS | Status: DC | PRN
  Administered 2018-02-18: 50 mg via INTRAVENOUS

## 2018-02-18 MED ORDER — CEFAZOLIN SODIUM-DEXTROSE 2-4 GM/100ML-% IV SOLN
2.0000 g | INTRAVENOUS | Status: AC
  Administered 2018-02-18: 2 g via INTRAVENOUS

## 2018-02-18 MED ORDER — GABAPENTIN 300 MG PO CAPS
300.0000 mg | ORAL_CAPSULE | Freq: Two times a day (BID) | ORAL | Status: DC
  Administered 2018-02-18 – 2018-02-23 (×11): 300 mg via ORAL
  Filled 2018-02-18 (×11): qty 1

## 2018-02-18 MED ORDER — FENTANYL CITRATE (PF) 100 MCG/2ML IJ SOLN
INTRAMUSCULAR | Status: DC | PRN
  Administered 2018-02-18: 150 ug via INTRAVENOUS
  Administered 2018-02-18: 25 ug via INTRAVENOUS

## 2018-02-18 MED ORDER — BUPIVACAINE LIPOSOME 1.3 % IJ SUSP
20.0000 mL | INTRAMUSCULAR | Status: AC
  Administered 2018-02-18: 20 mL
  Filled 2018-02-18: qty 20

## 2018-02-18 MED ORDER — DEXAMETHASONE SODIUM PHOSPHATE 4 MG/ML IJ SOLN
INTRAMUSCULAR | Status: DC | PRN
  Administered 2018-02-18: 8 mg via INTRAVENOUS

## 2018-02-18 MED ORDER — FLUTICASONE PROPIONATE 50 MCG/ACT NA SUSP
2.0000 | Freq: Every day | NASAL | Status: DC
  Administered 2018-02-19 – 2018-02-23 (×5): 2 via NASAL
  Filled 2018-02-18: qty 16

## 2018-02-18 MED ORDER — SODIUM CHLORIDE 0.9 % IV SOLN
INTRAVENOUS | Status: DC
  Administered 2018-02-18 – 2018-02-21 (×3): via INTRAVENOUS

## 2018-02-18 MED ORDER — DIPHENHYDRAMINE HCL 50 MG/ML IJ SOLN: 25.0000 mg | Freq: Four times a day (QID) | INTRAMUSCULAR | Status: DC | PRN

## 2018-02-18 MED ORDER — MIDAZOLAM HCL 5 MG/5ML IJ SOLN
INTRAMUSCULAR | Status: DC | PRN
  Administered 2018-02-18: 2 mg via INTRAVENOUS

## 2018-02-18 MED ORDER — VITAMIN D 1000 UNITS PO TABS
2000.0000 [IU] | ORAL_TABLET | Freq: Two times a day (BID) | ORAL | Status: DC
  Administered 2018-02-18 – 2018-02-23 (×11): 2000 [IU] via ORAL
  Filled 2018-02-18 (×12): qty 2

## 2018-02-18 MED ORDER — KETOROLAC TROMETHAMINE 15 MG/ML IJ SOLN
INTRAMUSCULAR | Status: AC
  Filled 2018-02-18: qty 1

## 2018-02-18 MED ORDER — SIMETHICONE 80 MG PO CHEW
40.0000 mg | CHEWABLE_TABLET | Freq: Four times a day (QID) | ORAL | Status: DC | PRN
Start: 2018-02-18 — End: 2018-02-23

## 2018-02-18 MED ORDER — 0.9 % SODIUM CHLORIDE (POUR BTL) OPTIME
TOPICAL | Status: DC | PRN
  Administered 2018-02-18: 1000 mL

## 2018-02-18 MED ORDER — ENSURE PRE-SURGERY PO LIQD: 296.0000 mL | Freq: Once | ORAL | Status: DC

## 2018-02-18 MED ORDER — KETOROLAC TROMETHAMINE 15 MG/ML IJ SOLN
15.0000 mg | INTRAMUSCULAR | Status: AC
  Administered 2018-02-18: 15 mg via INTRAVENOUS

## 2018-02-18 MED ORDER — POLYETHYLENE GLYCOL 3350 17 G PO PACK
17.0000 g | PACK | Freq: Every day | ORAL | Status: DC | PRN
  Administered 2018-02-18: 17 g via ORAL
  Filled 2018-02-18: qty 1

## 2018-02-18 MED ORDER — LIDOCAINE HCL (CARDIAC) PF 100 MG/5ML IV SOSY
PREFILLED_SYRINGE | INTRAVENOUS | Status: DC | PRN
  Administered 2018-02-18: 100 mg via INTRAVENOUS

## 2018-02-18 MED ORDER — ENOXAPARIN SODIUM 40 MG/0.4ML ~~LOC~~ SOLN
40.0000 mg | SUBCUTANEOUS | Status: DC
  Administered 2018-02-19 – 2018-02-20 (×2): 40 mg via SUBCUTANEOUS
  Filled 2018-02-18 (×2): qty 0.4

## 2018-02-18 MED ORDER — ACETAMINOPHEN 500 MG PO TABS
ORAL_TABLET | ORAL | Status: AC
  Filled 2018-02-18: qty 2

## 2018-02-18 MED ORDER — MORPHINE SULFATE (PF) 4 MG/ML IV SOLN: 2.0000 mg | INTRAVENOUS | Status: DC | PRN

## 2018-02-18 MED ORDER — HYDRALAZINE HCL 20 MG/ML IJ SOLN: 10.0000 mg | INTRAMUSCULAR | Status: DC | PRN

## 2018-02-18 MED ORDER — ACETAMINOPHEN 500 MG PO TABS
1000.0000 mg | ORAL_TABLET | ORAL | Status: AC
  Administered 2018-02-18: 1000 mg via ORAL

## 2018-02-18 MED ORDER — GABAPENTIN 300 MG PO CAPS
ORAL_CAPSULE | ORAL | Status: AC
  Filled 2018-02-18: qty 1

## 2018-02-18 MED ORDER — CEFAZOLIN SODIUM-DEXTROSE 2-4 GM/100ML-% IV SOLN
INTRAVENOUS | Status: AC
  Filled 2018-02-18: qty 100

## 2018-02-18 MED ORDER — FENTANYL CITRATE (PF) 100 MCG/2ML IJ SOLN: 25.0000 ug | INTRAMUSCULAR | Status: DC | PRN

## 2018-02-18 MED ORDER — HYDROCODONE-ACETAMINOPHEN 5-325 MG PO TABS: 1.0000 | ORAL_TABLET | ORAL | Status: DC | PRN

## 2018-02-18 MED ORDER — LACTATED RINGERS IV SOLN
INTRAVENOUS | Status: DC
  Administered 2018-02-18: 09:00:00 via INTRAVENOUS

## 2018-02-18 MED ORDER — BUPIVACAINE-EPINEPHRINE (PF) 0.25% -1:200000 IJ SOLN
INTRAMUSCULAR | Status: AC
  Filled 2018-02-18: qty 30

## 2018-02-18 MED ORDER — BUPIVACAINE HCL (PF) 0.25 % IJ SOLN
INTRAMUSCULAR | Status: AC
  Filled 2018-02-18: qty 30

## 2018-02-18 MED ORDER — SUGAMMADEX SODIUM 200 MG/2ML IV SOLN
INTRAVENOUS | Status: DC | PRN
  Administered 2018-02-18: 240 mg via INTRAVENOUS

## 2018-02-18 MED ORDER — FENTANYL CITRATE (PF) 250 MCG/5ML IJ SOLN
INTRAMUSCULAR | Status: AC
  Filled 2018-02-18: qty 5

## 2018-02-18 MED ORDER — PROPOFOL 10 MG/ML IV BOLUS
INTRAVENOUS | Status: DC | PRN
  Administered 2018-02-18: 200 mg via INTRAVENOUS

## 2018-02-18 MED ORDER — MIDAZOLAM HCL 2 MG/2ML IJ SOLN
INTRAMUSCULAR | Status: AC
  Filled 2018-02-18: qty 2

## 2018-02-18 MED ORDER — MOMETASONE FURO-FORMOTEROL FUM 200-5 MCG/ACT IN AERO
2.0000 | INHALATION_SPRAY | Freq: Two times a day (BID) | RESPIRATORY_TRACT | Status: DC
  Administered 2018-02-18 – 2018-02-23 (×9): 2 via RESPIRATORY_TRACT
  Filled 2018-02-18: qty 8.8

## 2018-02-18 MED ORDER — PROMETHAZINE HCL 25 MG/ML IJ SOLN: 6.2500 mg | INTRAMUSCULAR | Status: DC | PRN

## 2018-02-18 MED ORDER — MONTELUKAST SODIUM 10 MG PO TABS
10.0000 mg | ORAL_TABLET | Freq: Every day | ORAL | Status: DC
  Administered 2018-02-18 – 2018-02-22 (×5): 10 mg via ORAL
  Filled 2018-02-18 (×5): qty 1

## 2018-02-18 MED ORDER — KETOROLAC TROMETHAMINE 30 MG/ML IJ SOLN
30.0000 mg | Freq: Four times a day (QID) | INTRAMUSCULAR | Status: AC | PRN
  Administered 2018-02-18 – 2018-02-23 (×17): 30 mg via INTRAVENOUS
  Filled 2018-02-18 (×17): qty 1

## 2018-02-18 MED ORDER — GABAPENTIN 300 MG PO CAPS
300.0000 mg | ORAL_CAPSULE | ORAL | Status: AC
  Administered 2018-02-18: 300 mg via ORAL

## 2018-02-18 MED ORDER — PROPOFOL 10 MG/ML IV BOLUS
INTRAVENOUS | Status: AC
  Filled 2018-02-18: qty 20

## 2018-02-18 MED ORDER — VITAMIN C 500 MG PO TABS
1000.0000 mg | ORAL_TABLET | Freq: Every day | ORAL | Status: DC
  Administered 2018-02-18 – 2018-02-23 (×6): 1000 mg via ORAL
  Filled 2018-02-18 (×6): qty 2

## 2018-02-18 MED ORDER — SODIUM CHLORIDE 0.9 % IR SOLN
Status: DC | PRN
  Administered 2018-02-18: 1

## 2018-02-18 MED ORDER — KETOROLAC TROMETHAMINE 30 MG/ML IJ SOLN
INTRAMUSCULAR | Status: DC | PRN
  Administered 2018-02-18: 30 mg via INTRAVENOUS

## 2018-02-18 MED ORDER — ONDANSETRON 4 MG PO TBDP: 4.0000 mg | ORAL_TABLET | Freq: Four times a day (QID) | ORAL | Status: DC | PRN

## 2018-02-18 MED ORDER — DIPHENHYDRAMINE HCL 25 MG PO CAPS
25.0000 mg | ORAL_CAPSULE | Freq: Four times a day (QID) | ORAL | Status: DC | PRN
Start: 2018-02-18 — End: 2018-02-23

## 2018-02-18 MED ORDER — ONDANSETRON HCL 4 MG/2ML IJ SOLN: 4.0000 mg | Freq: Four times a day (QID) | INTRAMUSCULAR | Status: DC | PRN

## 2018-02-18 MED ORDER — EPHEDRINE SULFATE 50 MG/ML IJ SOLN
INTRAMUSCULAR | Status: DC | PRN
  Administered 2018-02-18: 5 mg via INTRAVENOUS

## 2018-02-18 MED ORDER — METHOCARBAMOL 500 MG PO TABS
500.0000 mg | ORAL_TABLET | Freq: Four times a day (QID) | ORAL | Status: DC | PRN
  Administered 2018-02-18 – 2018-02-23 (×16): 500 mg via ORAL
  Filled 2018-02-18 (×16): qty 1

## 2018-02-18 SURGICAL SUPPLY — 42 items
ADH SKN CLS APL DERMABOND .7 (GAUZE/BANDAGES/DRESSINGS) ×1
APPLIER CLIP 5 13 M/L LIGAMAX5 (MISCELLANEOUS)
APR CLP MED LRG 5 ANG JAW (MISCELLANEOUS)
BLADE CLIPPER SURG (BLADE) ×2 IMPLANT
CANISTER SUCT 3000ML PPV (MISCELLANEOUS) ×2 IMPLANT
CHLORAPREP W/TINT 26ML (MISCELLANEOUS) ×3 IMPLANT
CLIP APPLIE 5 13 M/L LIGAMAX5 (MISCELLANEOUS) IMPLANT
COVER SURGICAL LIGHT HANDLE (MISCELLANEOUS) ×3 IMPLANT
DERMABOND ADVANCED (GAUZE/BANDAGES/DRESSINGS) ×2
DERMABOND ADVANCED .7 DNX12 (GAUZE/BANDAGES/DRESSINGS) ×1 IMPLANT
DEVICE SECURE STRAP 25 ABSORB (INSTRUMENTS) ×4 IMPLANT
ELECT REM PT RETURN 9FT ADLT (ELECTROSURGICAL) ×3
ELECTRODE REM PT RTRN 9FT ADLT (ELECTROSURGICAL) ×1 IMPLANT
GLOVE BIOGEL PI IND STRL 7.0 (GLOVE) ×1 IMPLANT
GLOVE BIOGEL PI INDICATOR 7.0 (GLOVE) ×2
GLOVE SURG SS PI 7.0 STRL IVOR (GLOVE) ×3 IMPLANT
GOWN STRL REUS W/ TWL LRG LVL3 (GOWN DISPOSABLE) ×3 IMPLANT
GOWN STRL REUS W/TWL LRG LVL3 (GOWN DISPOSABLE) ×9
GRASPER SUT TROCAR 14GX15 (MISCELLANEOUS) ×3 IMPLANT
KIT BASIN OR (CUSTOM PROCEDURE TRAY) ×3 IMPLANT
KIT TURNOVER KIT B (KITS) ×3 IMPLANT
MESH VENTRALIGHT ST 6IN CRC (Mesh General) ×2 IMPLANT
NEEDLE 22X1 1/2 (OR ONLY) (NEEDLE) ×3 IMPLANT
NS IRRIG 1000ML POUR BTL (IV SOLUTION) ×3 IMPLANT
PAD ARMBOARD 7.5X6 YLW CONV (MISCELLANEOUS) ×6 IMPLANT
SCISSORS LAP 5X35 DISP (ENDOMECHANICALS) ×3 IMPLANT
SET IRRIG TUBING LAPAROSCOPIC (IRRIGATION / IRRIGATOR) ×2 IMPLANT
SHEARS HARMONIC ACE PLUS 36CM (ENDOMECHANICALS) IMPLANT
SLEEVE ENDOPATH XCEL 5M (ENDOMECHANICALS) ×6 IMPLANT
SUT MNCRL AB 4-0 PS2 18 (SUTURE) ×3 IMPLANT
SUT NOVA NAB GS-21 0 18 T12 DT (SUTURE) ×5 IMPLANT
SUT VIC AB 0 CT1 27 (SUTURE) ×3
SUT VIC AB 0 CT1 27XBRD ANBCTR (SUTURE) IMPLANT
TOWEL OR 17X24 6PK STRL BLUE (TOWEL DISPOSABLE) ×3 IMPLANT
TOWEL OR 17X26 10 PK STRL BLUE (TOWEL DISPOSABLE) ×3 IMPLANT
TRAY FOLEY CATH SILVER 16FR (SET/KITS/TRAYS/PACK) IMPLANT
TRAY LAPAROSCOPIC MC (CUSTOM PROCEDURE TRAY) ×3 IMPLANT
TROCAR XCEL 12X100 BLDLESS (ENDOMECHANICALS) ×3 IMPLANT
TROCAR XCEL NON-BLD 11X100MML (ENDOMECHANICALS) IMPLANT
TROCAR XCEL NON-BLD 5MMX100MML (ENDOMECHANICALS) ×5 IMPLANT
TUBING INSUFFLATION (TUBING) ×3 IMPLANT
WATER STERILE IRR 1000ML POUR (IV SOLUTION) ×3 IMPLANT

## 2018-02-18 NOTE — Anesthesia Procedure Notes (Signed)
Procedure Name: Intubation Date/Time: 02/18/2018 10:26 AM Performed by: Cleda Daub, CRNA Pre-anesthesia Checklist: Patient identified, Emergency Drugs available, Suction available and Patient being monitored Patient Re-evaluated:Patient Re-evaluated prior to induction Oxygen Delivery Method: Circle system utilized Preoxygenation: Pre-oxygenation with 100% oxygen Induction Type: IV induction Ventilation: Mask ventilation without difficulty and Mask ventilation throughout procedure Laryngoscope Size: Mac and 4 Grade View: Grade I Tube type: Oral Tube size: 8.0 mm Number of attempts: 1 Airway Equipment and Method: Stylet Placement Confirmation: ETT inserted through vocal cords under direct vision,  positive ETCO2 and breath sounds checked- equal and bilateral Secured at: 23 cm Tube secured with: Tape Dental Injury: Teeth and Oropharynx as per pre-operative assessment

## 2018-02-18 NOTE — H&P (Signed)
Kristopher Gay is an 67 y.o. male.   Chief Complaint: abdominal pain HPI: 67 yo male with a long history of constipation, abdominal pain, back pain. He underwent laparoscopic inguinal hernia repairs and primary repair of umbilical hernia. The umbilical hernia has now enlarged.  Past Medical History:  Diagnosis Date  . Allergic rhinitis, cause unspecified   . Allergy   . Anxiety   . Blood clot in vein 2002   left calf SUPERFICIAL SLOT REMOVED THEN PART OF VEIN REMOVED  . Chronic headaches   . COPD (chronic obstructive pulmonary disease) ([place])   . Cough    X 1 YEAR NO FEVER CLEAR SPUTUM  . DDD (degenerative disc disease)   . Depression   . DJD (degenerative joint disease)    ALL THE WAY DOWN SPINE  . Dropfoot    LEFT , NO BRACE WORN NOW  . Emphysema of lung ([place])   . Extrinsic asthma, unspecified   . GERD (gastroesophageal reflux disease)    OCC NO MEDS FOR  . Hypertension    STOPPED HTN MEDS UNTIL 2011, THEN HAD WEIGHT LOSS, NO MEDS SINCE  . Neuromuscular disorder ([place])   . Neuropathy    POLYNEUOPATHOPATHY FEET AND LEGS  . Peripheral vascular disease ([facility])    VARICOSE VEINS LEFT LEG  . Pneumonia AS CHILD  . Umbilical hernia   . Unspecified asthma(493.90)     Past Surgical History:  Procedure Laterality Date  . COLONSCOPY  06/16/2017  . HERNIA REPAIR    . INGUINAL HERNIA REPAIR Bilateral 07/20/2017   Procedure: LAPAROSCOPIC BILATERAL INGUINAL HERNIA REPAIR WITH MESH, UMBILICAL HERNIA REPAIR AND LYSIS OF ADHESIONS;  Surgeon: [name], [name], MD;  Location: [facility];  Service: General;  Laterality: Bilateral;  . LAPAROSCOPY N/A 07/20/2017   Procedure: LAPAROSCOPY DIAGNOSTIC;  Surgeon: Kieth Brightly [name], MD;  Location: [facility];  Service: General;  Laterality: N/A;  . ROTATOR CUFF REPAIR Left 2005   detached; left shoulder-reattached  . SHOULDER SURGERY Right    laBRAL  tendon torn  . SUPERFICIAL LEFT LEG VEIN STRIPPING WITH SMALL SUPERFICIAL CLOT  2002    Family  History  Problem Relation Age of Onset  . Pancreatic cancer Mother   . Breast cancer Sister   . Epilepsy Brother   . Adrenal disorder Neg Hx    Social History:  reports that he has never smoked. He has never used smokeless tobacco. He reports that he does not drink alcohol or use drugs.  Allergies:  Allergies  Allergen Reactions  . Bee Venom Swelling  . Linzess [Linaclotide] Other (See Comments)    Abdominal pain  . Molds & Smuts   . Seasonal Ic [Cholestatin]     Environmental Allergies    Medications Prior to Admission  Medication Sig Dispense Refill  . ascorbic acid (VITAMIN C) 1000 MG tablet Take 1,000 mg by mouth daily.     . Calcium-Magnesium-Vitamin D (CALCIUM 1200+D3 PO) Take 1 tablet by mouth 2 (two) times daily.    . celecoxib (CELEBREX) 200 MG capsule TAKE 1 CAPSULE(200 MG) BY MOUTH DAILY 30 capsule 6  . Cholecalciferol (VITAMIN D3) 2000 units TABS Take 1 tablet by mouth 2 (two) times daily.     Marland Kitchen DHEA 25 MG CAPS Take 1 capsule by mouth daily. Reported on 12/19/2015    . fluticasone (FLONASE) 50 MCG/ACT nasal spray Place 2 sprays into both nostrils daily. 16 g 12  . Fluticasone-Salmeterol (ADVAIR DISKUS) 500-50 MCG/DOSE AEPB INHALE 1 PUFF BY  MOUTH TWICE DAILY, THEN RINSE MOUTH 1 each 5  . Glucosamine-Chondroit-Vit C-Mn (GLUCOSAMINE 1500 COMPLEX) CAPS Take 1 capsule by mouth 2 (two) times daily. Reported on 12/19/2015    . montelukast (SINGULAIR) 10 MG tablet Take 1 tablet (10 mg total) by mouth at bedtime. 30 tablet 5  . Multiple Vitamin (MULTIVITAMIN) tablet Take 1 tablet by mouth 2 (two) times daily. Reported on 12/19/2015    . Omega-3 Fatty Acids (FISH OIL) 1000 MG CAPS Take 1 capsule by mouth 2 (two) times daily. Reported on 12/19/2015    . polyethylene glycol (MIRALAX / GLYCOLAX) packet Take 17 g by mouth daily as needed for mild constipation.    . vitamin B-12 (CYANOCOBALAMIN) 1000 MCG tablet Take 1,000 mcg by mouth daily.    . vitamin E (VITAMIN E) 400 UNIT capsule  Take 400 Units by mouth daily. Reported on 12/19/2015    . EPINEPHrine (EPIPEN 2-PAK) 0.3 mg/0.3 mL SOAJ injection Inject 0.3 mg into the muscle once. Reported on 12/13/2015    . Tiotropium Bromide-Olodaterol (STIOLTO RESPIMAT) 2.5-2.5 MCG/ACT AERS Inhale 2 puffs into the lungs daily. (Patient not taking: Reported on 02/12/2018) 1 Inhaler 0    No results found for this or any previous visit (from the past 61 hour(s)). No results found.  Review of Systems  Constitutional: Negative for chills and fever.  HENT: Negative for hearing loss.   Eyes: Negative for blurred vision and double vision.  Respiratory: Negative for cough and hemoptysis.   Cardiovascular: Negative for chest pain and palpitations.  Gastrointestinal: Positive for abdominal pain and constipation. Negative for nausea and vomiting.  Genitourinary: Negative for dysuria and urgency.  Musculoskeletal: Negative for myalgias and neck pain.  Skin: Negative for itching and rash.  Neurological: Negative for dizziness, tingling and headaches.  Endo/Heme/Allergies: Does not bruise/bleed easily.  Psychiatric/Behavioral: Negative for depression and suicidal ideas.    Blood pressure (!) 145/81, pulse 73, temperature 98.4 F (36.9 C), temperature source Oral, resp. rate 17, SpO2 93 %. Physical Exam  Vitals reviewed. Constitutional: [name], place, and time. He appears well-developed and well-nourished.  HENT:  Head: Normocephalic and atraumatic.  Eyes: Pupils are equal, round, and reactive to light. Conjunctivae and EOM are normal.  Neck: Normal range of motion. Neck supple.  Cardiovascular: Normal rate and regular rhythm.  Respiratory: Effort normal and breath sounds normal.  GI: Soft. Bowel sounds are normal. He exhibits no distension. There is tenderness.  Small umbilical hernia  Musculoskeletal: Normal range of motion.  Neurological: He is alert and oriented to person, place, and time.  Skin: Skin is warm and dry.   Psychiatric: [name] His behavior is normal.     Assessment/Plan 67 yo male with umbilical hernia -lap umbilical hernia repair with mesh -enhanced recovery protocol -observation postoperatively  Mickeal Skinner, MD 02/18/2018, 10:13 AM

## 2018-02-18 NOTE — Transfer of Care (Signed)
Immediate Anesthesia Transfer of Care Note  Patient: Kristopher Gay  Procedure(s) Performed: LAPAROSCOPIC UMBILICAL HERNIA REPAIR ERAS PATHWAY (N/A Abdomen)  Patient Location: PACU  Anesthesia Type:General  Level of Consciousness: awake, alert , oriented and patient cooperative  Airway & Oxygen Therapy: Patient Spontanous Breathing and Patient connected to face mask oxygen  Post-op Assessment: Report given to RN and Post -op Vital signs reviewed and stable  Post vital signs: stable  Last Vitals:  Vitals Value Taken Time  BP 153/92 02/18/2018 11:54 AM  Temp 36.3 C 02/18/2018 11:54 AM  Pulse 84 02/18/2018 11:58 AM  Resp 9 02/18/2018 11:58 AM  SpO2 97 % 02/18/2018 11:58 AM  Vitals shown include unvalidated device data.  Last Pain:  Vitals:   02/18/18 1154  TempSrc:   PainSc: Asleep         Complications: No apparent anesthesia complications

## 2018-02-18 NOTE — Anesthesia Preprocedure Evaluation (Signed)
Anesthesia Evaluation  Patient identified by MRN, date of birth, ID band Patient awake    Reviewed: Allergy & Precautions, NPO status , Patient's Chart, lab work & pertinent test results  Airway Mallampati: II  TM Distance: >3 FB Neck ROM: Full    Dental  (+) Dental Advisory Given   Pulmonary asthma , COPD,  COPD inhaler,    breath sounds clear to auscultation       Cardiovascular Exercise Tolerance: Good hypertension, (-) angina+ Peripheral Vascular Disease and + DVT  (-) Past MI  Rhythm:Regular Rate:Normal     Neuro/Psych  Headaches, PSYCHIATRIC DISORDERS Anxiety Depression  Neuromuscular disease    GI/Hepatic Neg liver ROS, GERD  ,  Endo/Other  Obesity   Renal/GU      Musculoskeletal  (+) Arthritis ,   Abdominal   Peds  Hematology  (+) Blood dyscrasia (Thrombocytopenia), ,   Anesthesia Other Findings   Reproductive/Obstetrics                             Lab Results  Component Value Date   WBC 7.4 02/12/2018   HGB 15.2 02/12/2018   HCT 44.6 02/12/2018   MCV 89.9 02/12/2018   PLT 146 (L) 02/12/2018   Lab Results  Component Value Date   CREATININE 1.08 02/12/2018   BUN 16 02/12/2018   NA 139 02/12/2018   K 4.2 02/12/2018   CL 107 02/12/2018   CO2 22 02/12/2018    Anesthesia Physical  Anesthesia Plan  ASA: III  Anesthesia Plan: General   Post-op Pain Management:    Induction: Intravenous  PONV Risk Score and Plan: 3 and Ondansetron, Dexamethasone, Midazolam and Treatment may vary due to age or medical condition  Airway Management Planned: Oral ETT  Additional Equipment:   Intra-op Plan:   Post-operative Plan: Extubation in OR  Informed Consent: I have reviewed the patients History and Physical, chart, labs and discussed the procedure including the risks, benefits and alternatives for the proposed anesthesia with the patient or authorized representative who has  indicated his/her understanding and acceptance.   Dental advisory given  Plan Discussed with: CRNA  Anesthesia Plan Comments:         Anesthesia Quick Evaluation

## 2018-02-18 NOTE — Op Note (Signed)
Preoperative diagnosis: umbilical hernia without obstruction or gangrene  Postoperative diagnosis: Same   Procedure: laparoscopic umbilical hernia repair with mesh  Surgeon: Gurney Maxin, M.D.  Asst: none  Anesthesia: Gen.   Indications for procedure: Kristopher Gay is a 67 y.o. male with symptoms of abdominal pain and hernia reducible on exam.  Description of procedure: The patient was brought into the operative suite, placed supine. Anesthesia was administered with endotracheal tube. Patient was strapped in place and foot board was secured. Both arms were tucked. All pressure points were offloaded by foam padding. The patient was prepped and draped in the usual sterile fashion.  A small incision was made over the left subcostal area and 67mm trocar was placed with optical entry. Pneumoperitoneum was applied with high flow low pressure. The abdominal cavity was inspected and the omentum was going up to the umbilical hernia. 1 66mm trocar was placed in the mid left abdomen and 3 additional 31mm trocars 1 in the LLQ. Exparel:Marcaine mix was used to create a TAP block on the right and left sides.  Blunt dissection was used to remove most of the filmy adhesions with occasional sharp dissection. Cautery was used to provide hemostasis. The hernia was identified and was filled with omentum. It was slowly dissected free and reduced. The defect was about 3-4 cm in diameter. A 15cm circular ventralight mesh was inserted and used to the repair the mesh. 8 transfascial 0 novafil sutures were used to secure the mesh in place and absorbable tackers were used to appose the mesh against the abdominal wall in all areas.  The abdominal contents were again inspected and hemostasis was intact.  0 vicryl was used to close the fascial defect of the 81mm trocar site using suture passer. Pneumoperitoneum was removed, all trocar were removed. All incisions were closed with 4-0 monocryl subcuticular stitch. The patient  woke from anesthesia and was brought to PACU in stable condition.  Findings: 4-1DE umbilical hernia  Specimen: none  Blood loss: <30 ml  Local anesthesia: 83ml exparel:Marcaine mix  Complications: noen  Implant: 15cm round ventralight ST mesh  Gurney Maxin, M.D. General, Bariatric, & Minimally Invasive Surgery [facility]

## 2018-02-19 ENCOUNTER — Observation Stay (HOSPITAL_COMMUNITY): Payer: Medicare Other

## 2018-02-19 ENCOUNTER — Encounter (HOSPITAL_COMMUNITY): Payer: Self-pay | Admitting: General Surgery

## 2018-02-19 DIAGNOSIS — J449 Chronic obstructive pulmonary disease, unspecified: Secondary | ICD-10-CM | POA: Diagnosis not present

## 2018-02-19 DIAGNOSIS — G629 Polyneuropathy, unspecified: Secondary | ICD-10-CM | POA: Diagnosis not present

## 2018-02-19 DIAGNOSIS — I119 Hypertensive heart disease without heart failure: Secondary | ICD-10-CM | POA: Diagnosis not present

## 2018-02-19 DIAGNOSIS — K429 Umbilical hernia without obstruction or gangrene: Secondary | ICD-10-CM | POA: Diagnosis not present

## 2018-02-19 LAB — BASIC METABOLIC PANEL
Anion gap: 9 (ref 5–15)
BUN: 20 mg/dL (ref 6–20)
CO2: 24 mmol/L (ref 22–32)
Calcium: 8.6 mg/dL — ABNORMAL LOW (ref 8.9–10.3)
Chloride: 105 mmol/L (ref 101–111)
Creatinine, Ser: 1.02 mg/dL (ref 0.61–1.24)
GFR calc Af Amer: >60 mL/min (ref >60)
GFR calc non Af Amer: >60 mL/min (ref >60)
Glucose, Bld: 106 mg/dL — ABNORMAL HIGH (ref 65–99)
Potassium: 3.8 mmol/L (ref 3.5–5.1)
Sodium: 138 mmol/L (ref 135–145)

## 2018-02-19 LAB — CBC
HCT: 41.6 % (ref 39.0–52.0)
Hemoglobin: 14.3 g/dL (ref 13.0–17.0)
MCH: 31.6 pg (ref 26.0–34.0)
MCHC: 34.4 g/dL (ref 30.0–36.0)
MCV: 92 fL (ref 78.0–100.0)
Platelets: 128 10*3/uL — ABNORMAL LOW (ref 150–400)
RBC: 4.52 MIL/uL (ref 4.22–5.81)
RDW: 13.1 % (ref 11.5–15.5)
WBC: 10.9 10*3/uL — ABNORMAL HIGH (ref 4.0–10.5)

## 2018-02-19 MED ORDER — SODIUM CHLORIDE 0.9 % IV BOLUS
500.0000 mL | Freq: Once | INTRAVENOUS | Status: AC
  Administered 2018-02-19: 500 mL via INTRAVENOUS

## 2018-02-19 MED ORDER — IBUPROFEN 800 MG PO TABS: 800.0000 mg | ORAL_TABLET | Freq: Three times a day (TID) | ORAL | 0 refills | Status: DC | PRN

## 2018-02-19 MED ORDER — METHOCARBAMOL 500 MG PO TABS: 500.0000 mg | ORAL_TABLET | Freq: Four times a day (QID) | ORAL | 0 refills | Status: DC | PRN

## 2018-02-19 MED ORDER — POLYETHYLENE GLYCOL 3350 17 G PO PACK
17.0000 g | PACK | Freq: Four times a day (QID) | ORAL | Status: DC | PRN
  Administered 2018-02-19 – 2018-02-23 (×14): 17 g via ORAL
  Filled 2018-02-19 (×14): qty 1

## 2018-02-19 NOTE — Anesthesia Postprocedure Evaluation (Signed)
Anesthesia Post Note  Patient: Kristopher Gay  Procedure(s) Performed: LAPAROSCOPIC UMBILICAL HERNIA REPAIR ERAS PATHWAY (N/A Abdomen)     Patient location during evaluation: PACU Anesthesia Type: General Level of consciousness: awake and alert Pain management: pain level controlled Vital Signs Assessment: post-procedure vital signs reviewed and stable Respiratory status: spontaneous breathing, nonlabored ventilation and respiratory function stable Cardiovascular status: blood pressure returned to baseline and stable Postop Assessment: no apparent nausea or vomiting Anesthetic complications: no    Last Vitals:  Vitals:   02/19/18 0356 02/19/18 0812  BP: (!) 139/91   Pulse: 83   Resp:    Temp: 36.5 C   SpO2: 92% 92%    Last Pain:  Vitals:   02/19/18 0356  TempSrc: Oral  PainSc:                  Catalina Gravel

## 2018-02-19 NOTE — Progress Notes (Signed)
  Progress Note: General Surgery Service   Assessment/Plan: Patient Active Problem List   Diagnosis Date Noted  . Umbilical hernia 16/07/9603  . Hernia of abdominal wall 07/20/2017  . Chronic right shoulder pain 05/15/2016  . Right knee pain 05/15/2016  . Adrenal adenoma 02/20/2016  . Chronic pain syndrome 10/12/2015  . Odynophagia 05/22/2015  . Dysphagia, pharyngoesophageal phase 09/14/2014  . Colon cancer screening 09/14/2014  . Other chronic postoperative pain 01/16/2014  . Allergy, insect bite 07/02/2012  . GERD (gastroesophageal reflux disease) 03/25/2012  . 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 Lumbosacral spondylosis 01/13/2012  . Chronic back pain 11/17/2011  . Seasonal and perennial allergic rhinitis 12/26/2010  . MUSCULOSKELETAL PAIN 04/16/2009  . Asthmatic bronchitis , chronic ([place]) 01/30/2008   s/p Procedure(s): LAPAROSCOPIC UMBILICAL HERNIA REPAIR ERAS PATHWAY 02/18/2018  -repeat EKG this morning -if abnormal will consult cardiology -PT eval due to instability -continue pain control, patient trying to avoid narcotics -ok for diet -increase frequency miralax availability to baseline   LOS: 0 days  Chief Complaint/Subjective: Pain moderate, fell yesterday with lightheadedness and imbalance  Objective: Vital signs in last 24 hours: Temp:  [97.4 F (36.3 C)-98.3 F (36.8 C)] 97.7 F (36.5 C) (04/26 0356) Pulse Rate:  [67-84] 83 (04/26 0356) Resp:  [5-19] 12 (04/25 1250) BP: (104-153)/(64-92) 139/91 (04/26 0356) SpO2:  [89 %-98 %] 92 % (04/26 0812) Last BM Date: 02/17/18  Intake/Output from previous day: 04/25 0701 - 04/26 0700 In: 1746.7 [P.O.:120; I.V.:1626.7] Out: 5 [Blood:5] Intake/Output this shift: No intake/output data recorded.  Gen: NAD  Lungs: CTAB  Cardiovascular: reg rate currently  Abd: soft, ATTP, incisions c/d/i, binder in place  Extremities: no edema  Neuro: AOx4  Studies/Results:  WBC 10.9, Hgb 14.3, creatinine stable at  1.02 all consistent with normal post operative changes   Mickeal Skinner, MD [facility]

## 2018-02-19 NOTE — Significant Event (Addendum)
Rapid Response Event Note  Overview: Syncope  Initial Focused Assessment: Received call from RN that patient was up in the bathroom and had a syncopal episode. When I arrived, patient was sitting in the recliner and stated that he felt fine when he got up but once he arrived in the bathroom, he immediately passed out. Per patient, he denies hitting his head and currently denies being in pain but states the something doesn't feel in his chest, he endorses feeling palpitations and feels short of breath. Per RN, his HR is irregular. Patient is alert and oriented, appears anxious, lung sounds are clear and patient is not in respiratory distress, no increased WOB noticed, + pulses, skin is cool/clammy and patient is diaphoretic. SBP 100-120s, HR 90-110 irregular, MAPs > 65, sats 89-94% on 4L [place] (was on prior to my arrival), and RR 18-22.   Interventions: -- STAT EKG -- STAT CXR -- 500cc NS bolus -- STAT BMP/CBC -- Telemetry monitoring -- Continuous pulse-oximetry monitoring.   Plan of Care (if not transferred): -- I spoke with the MD on call and updated him on the patient's condition. Received orders for fluids and labs. -- Informed patient that should he need to get up, to ask for assistance.  -- I called for an update at 31, per RN, patient is resting comfortably, still in AF  Event Summary:   at    Call Time 0418 End Time [place], [place]

## 2018-02-19 NOTE — Progress Notes (Signed)
Pt. Has refused low bed and refused to sleep on the bed the patient stated that low bed is  not comfortable bed for him

## 2018-02-19 NOTE — Progress Notes (Addendum)
Pt.was found fallen on the bath room floor.He was sitting on the chair and got up and use his walker to go to bath room the patient stated that he fainted and denied he hit his head.Md was notified.

## 2018-02-19 NOTE — Progress Notes (Addendum)
ptWas the fall witnessed: no  Patient condition before and after the fall:pt.is alert and oriented .Pt.stated that he feel dizzy and fainted while he was in bath room.After fall pt stated that he doesn't feel good.After fall vitals are taken and Physician notified.  Patient's reaction to the fall: His o2 level He does not want to the family notified.  Name of the doctor that was notified including date and time:Dr.Burke Thompson,at 04:10  Any interventions and vital signs: Temp,97.7,Bp139/91 pulse 83,Pso2 83%.Tried to calm him down by guiding to deep breath,3L [place] was given via [place].Rapid response called when his HR was up and down and pt.stated that he feels his chest congested.Heart monitor in place,continouse pulse monitor and  EKG done.Flued bolus given and Pain med administer.

## 2018-02-19 NOTE — Evaluation (Signed)
Physical Therapy Evaluation Patient Details Name: Kristopher Gay MRN: [id number] DOB: 02/13/1951 Today's Date: 02/19/2018   History of Present Illness  Kristopher Gay is a 67yo male who comes [facility] for inguinal/umbilical hernia repair. Evening after surgery, pt AMB to [facility] with RN and sustained a syncopal episode, with subsequent breathlessness, desaturation, and irregular heart beat. PTA pt reports only mild difficulty with community distance AMB with sPC or shopping buggy. No prior episodes. No cardiac diagnoses. Pt reports COPD but never required O2 supplementation previosuly.   Clinical Impression  Pt admitted with above diagnosis. Pt currently with functional limitations due to the deficits listed below (see "PT Problem List"). Upon entry, the patient is received up to chair. No acute distress noted at this time. Pt received on 4L/min per [place], but [place] not donned correctly, likely not delivering rate correctly. Transfers and AMB at minGuard assist for safety, but patient mobilizing close to baseline. Ortho vitals negative, but HR remains irregular and in 130-150s with easy AMB. SpO2 WNL AMB on room air. Pt denies giddiness, SOB, DOE, CP, cardiac history, prior syncope. Pt will benefit from skilled PT intervention to increase independence and safety with basic mobility in preparation for discharge to the venue listed below.    Position/Activity  BP (mm Hg)  SpO2 (%)  Heartrate (BPM)  Flow Rate (L/min)   Supine, resting      Seated in chair 92/65 96 78 4L/min  s/p 60 seconds standing 129/83 94 108 2L/min  s/p 2 minutes AMB  135/85 93 108-128 (irregular) 2L/min  S/p 4 minutes AMB    90 135-150s (irregular) Room air                Follow Up Recommendations Outpatient PT    Equipment Recommendations  Rolling walker with 5" wheels    Recommendations for Other Services       Precautions / Restrictions Precautions Precautions: Fall Precaution Comments: syncopal episode on 02/19/18       Mobility  Bed Mobility               General bed mobility comments: up in chair upon entry  Transfers Overall transfer level: Modified independent Equipment used: Rolling walker (2 wheeled)                Ambulation/Gait Ambulation/Gait assistance: Min guard Ambulation Distance (Feet): 120 Feet Assistive device: Rolling walker (2 wheeled) Gait Pattern/deviations: WFL(Within Functional Limits) Gait velocity: 0.74m/s  Gait velocity interpretation: <1.31 ft/sec, indicative of household ambulator General Gait Details: HR on tele box ranging frm 130-150s. No presyncopal prodrome.   Stairs            Wheelchair Mobility    Modified Rankin (Stroke Patients Only)       Balance Overall balance assessment: Modified Independent                                           Pertinent Vitals/Pain Pain Assessment: 0-10 Pain Score: 5  Pain Location: surgical site, only whiel standing, well controlled in sitting posture.  Pain Intervention(s): Limited activity within patient's tolerance;Repositioned;Monitored during session    [name] expects to be discharged to:: Private residence Living Arrangements: Alone   Type of Home: House Home Access: Stairs to enter   [employer] of Steps: 2 Home Layout: One level Home Equipment: [place] - single point  Prior Function Level of Independence: Independent with assistive device(s)         Comments: uses cane for balance issues     Hand Dominance        Extremity/Trunk Assessment                Communication   Communication: No difficulties  Cognition Arousal/Alertness: Awake/alert Behavior During Therapy: WFL for tasks assessed/performed;Anxious(mildly anxious about not being able to be mobile. ) Overall Cognitive Status: Within Functional Limits for tasks assessed                                        General Comments       Exercises     Assessment/Plan    PT Assessment Patient needs continued PT services  PT Problem List Decreased strength;Decreased activity tolerance;Decreased balance;Decreased mobility;Cardiopulmonary status limiting activity       PT Treatment Interventions Functional mobility training;Stair training;Therapeutic activities;Therapeutic exercise;Patient/family education    PT Goals (Current goals can be found in the Care Plan section)  Acute Rehab PT Goals Patient Stated Goal: be ambulatory ad lib  PT Goal Formulation: With patient Time For Goal Achievement: 02/26/18 Potential to Achieve Goals: Good    Frequency Min 3X/week   Barriers to discharge Decreased caregiver support      Co-evaluation               AM-PAC PT "6 Clicks" Daily Activity  Outcome Measure Difficulty turning over in bed (including adjusting bedclothes, sheets and blankets)?: A Little Difficulty moving from lying on back to sitting on the side of the bed? : A Lot Difficulty sitting down on and standing up from a chair with arms (e.g., wheelchair, bedside commode, etc,.)?: A Lot Help needed moving to and from a bed to chair (including a wheelchair)?: A Little Help needed walking in hospital room?: A Little Help needed climbing 3-5 steps with a railing? : A Little 6 Click Score: 16    End of Session Equipment Utilized During Treatment: Gait belt;Oxygen Activity Tolerance: Patient tolerated treatment well;Treatment limited secondary to medical complications (Comment)(irreguluar tachycardia on tele box ) Patient left: in chair;with call bell/phone within reach;with SCD's reapplied Nurse Communication: Mobility status PT Visit Diagnosis: Difficulty in walking, not elsewhere classified (R26.2);Other abnormalities of gait and mobility (R26.89)    Time: 7425-9563 PT Time Calculation (min) (ACUTE ONLY): 40 min   Charges:   PT Evaluation $PT Eval Moderate Complexity: 1 Mod PT Treatments $Therapeutic  Activity: 8-22 mins   PT G Codes:        5:23 PM, 02-21-2018 Etta Grandchild, PT, DPT Physical Therapist - [place] [phone] (Pager)  [phone] (Office)     Eldon Zietlow C 2018/02/21, 5:14 PM

## 2018-02-20 DIAGNOSIS — I480 Paroxysmal atrial fibrillation: Secondary | ICD-10-CM | POA: Diagnosis present

## 2018-02-20 DIAGNOSIS — I4891 Unspecified atrial fibrillation: Secondary | ICD-10-CM | POA: Diagnosis not present

## 2018-02-20 DIAGNOSIS — I119 Hypertensive heart disease without heart failure: Secondary | ICD-10-CM | POA: Diagnosis not present

## 2018-02-20 DIAGNOSIS — G629 Polyneuropathy, unspecified: Secondary | ICD-10-CM | POA: Diagnosis not present

## 2018-02-20 DIAGNOSIS — K429 Umbilical hernia without obstruction or gangrene: Secondary | ICD-10-CM | POA: Diagnosis not present

## 2018-02-20 DIAGNOSIS — J449 Chronic obstructive pulmonary disease, unspecified: Secondary | ICD-10-CM | POA: Diagnosis not present

## 2018-02-20 MED ORDER — APIXABAN 5 MG PO TABS
5.0000 mg | ORAL_TABLET | Freq: Two times a day (BID) | ORAL | Status: DC
  Administered 2018-02-20 – 2018-02-23 (×7): 5 mg via ORAL
  Filled 2018-02-20 (×7): qty 1

## 2018-02-20 MED ORDER — METOPROLOL SUCCINATE ER 25 MG PO TB24
25.0000 mg | ORAL_TABLET | Freq: Every day | ORAL | Status: DC
  Administered 2018-02-20 – 2018-02-23 (×4): 25 mg via ORAL
  Filled 2018-02-20 (×4): qty 1

## 2018-02-20 NOTE — Progress Notes (Addendum)
Patient non complaint  with calling for help before getting out of chair. Patient refused chair alarm. Patient informed he is a high risk for falling since he fell yesterday.

## 2018-02-20 NOTE — Progress Notes (Signed)
Safety education and instructions given to patient, patient ream ins non compliant, he stated that he can get up and walk without assistance in the room. Patient reminded of the need to call for help before getting out bed to prevent fall, call bell within reach, patient sitting on the recliner, will continue to monitor.

## 2018-02-20 NOTE — Discharge Instructions (Signed)

## 2018-02-20 NOTE — Progress Notes (Addendum)
2 Days Post-Op  Subjective: Alert and stable.  Ambulating to the bathroom.  Tolerating liquid diet.  Advance to regular as tolerated. Heart rate has varied from 90s to 130s. EKG shows atrial fibrillation which is apparently a new finding for patient denies prior history of cardiac disease.   Denies chest pain or shortness of breath SPO2 in the 90s Using rolling walker per PT. Cardiology consultation requested.  I spoke with Dr. Debara Pickett who will see him today. Okay to anticoagulate if necessary  Objective: Vital signs in last 24 hours: Temp:  [97.7 F (36.5 C)-98.3 F (36.8 C)] 97.9 F (36.6 C) (04/27 0335) Pulse Rate:  [93-150] 103 (04/27 0335) Resp:  [17-18] 18 (04/27 0335) BP: (98-143)/(65-75) 123/74 (04/27 0335) SpO2:  [90 %-97 %] 96 % (04/27 0335) Last BM Date: 02/17/18  Intake/Output from previous day: 04/26 0701 - 04/27 0700 In: 710 [P.O.:710] Out: -  Intake/Output this shift: No intake/output data recorded.  General appearance: Alert.  No distress.  Oriented.  Skin warm and dry. Resp: clear to auscultation bilaterally Cardio: Heart rate 103.  Irregular. GI: Soft.  Wounds look good.  Appropriately tender.  Active bowel sounds.  Not distended.  Lab Results:  No results found for this or any previous visit (from the past 24 hour(s)).   Studies/Results: No results found.  . cholecalciferol  2,000 Units Oral BID  . enoxaparin (LOVENOX) injection  40 mg Subcutaneous Q24H  . fluticasone  2 spray Each Nare Daily  . gabapentin  300 mg Oral BID  . mometasone-formoterol  2 puff Inhalation BID  . montelukast  10 mg Oral QHS  . ascorbic acid  1,000 mg Oral Daily     Assessment/Plan: s/p Procedure(s): LAPAROSCOPIC UMBILICAL HERNIA REPAIR ERAS PATHWAY  POD #2.  Laparoscopic periumbilical hernia repair -Wounds look fine -Diet as tolerated -Ambulate with rolling walker per PT recommendations  New onset atrial fibrillation with RVR. -Asymptomatic -Dr. Debara Pickett to  evaluate today -Continue telemetry  [facility] yesterday.   -Question whether this was near syncope -Hemodynamically stable -No clinical evidence of injury  Depression COPD and asthma.  Not hypoxic Polyneuropathy Hypertension      @PROBHOSP @  LOS: 0 days    Adin Hector 02/20/2018  . .prob

## 2018-02-20 NOTE — Consult Note (Signed)
CONSULTATION NOTE   Patient Name: Kristopher Gay Date of Encounter: 02/20/2018 Cardiologist: No primary care provider on file.  Chief Complaint   Mild abdominal discomfort  Patient Profile   67 yo male s/p laparoscopic hernia repair, noted to have new onset afib.  HPI   Kristopher Gay is a 67 y.o. male who is being seen today for the evaluation of new onset afib at the request of Dr. Dalbert Batman. This is a pleasant gentleman who was hospitalized for elective hernia repair. He has a history of COPD, HTN, PVD and peripheral neuropathy. He underwent surgery uneventfully on 02/18/18 by Dr. Kieth Brightly under general anesthesia. He apparently had a post-op fall that was recorded on 4/26 while trying to use the bathroom. He was noted by PT to have an irregular tachycardia with HR in the 130-150's with ambulation yesterday. We were notified of newly identified a-fib this am and asked to consult. He denies chest pain, dyspnea or associated symptoms. EKG personally reviewed today which shows a-fib and RVR at 108.   PMHx   Past Medical History:  Diagnosis Date  . Allergic rhinitis, cause unspecified   . Allergy   . Anxiety   . Blood clot in vein 2002   left calf SUPERFICIAL SLOT REMOVED THEN PART OF VEIN REMOVED  . Chronic headaches   . COPD (chronic obstructive pulmonary disease) ([place])   . Cough    X 1 YEAR NO FEVER CLEAR SPUTUM  . DDD (degenerative disc disease)   . Depression   . DJD (degenerative joint disease)    ALL THE WAY DOWN SPINE  . Dropfoot    LEFT , NO BRACE WORN NOW  . Emphysema of lung ([place])   . Extrinsic asthma, unspecified   . GERD (gastroesophageal reflux disease)    OCC NO MEDS FOR  . Hypertension    STOPPED HTN MEDS UNTIL 2011, THEN HAD WEIGHT LOSS, NO MEDS SINCE  . Neuromuscular disorder ([place])   . Neuropathy    POLYNEUOPATHOPATHY FEET AND LEGS  . Peripheral vascular disease (HCC)    VARICOSE VEINS LEFT LEG  . Pneumonia AS CHILD  . Umbilical hernia   . Unspecified  asthma(493.90)     Past Surgical History:  Procedure Laterality Date  . COLONSCOPY  06/16/2017  . HERNIA REPAIR    . INGUINAL HERNIA REPAIR Bilateral 07/20/2017   Procedure: LAPAROSCOPIC BILATERAL INGUINAL HERNIA REPAIR WITH MESH, UMBILICAL HERNIA REPAIR AND LYSIS OF ADHESIONS;  Surgeon: [name], [name], MD;  Location: [facility];  Service: General;  Laterality: Bilateral;  . LAPAROSCOPY N/A 07/20/2017   Procedure: LAPAROSCOPY DIAGNOSTIC;  Surgeon: Kieth Brightly [name], MD;  Location: [facility];  Service: General;  Laterality: N/A;  . ROTATOR CUFF REPAIR Left 2005   detached; left shoulder-reattached  . SHOULDER SURGERY Right    laBRAL  tendon torn  . SUPERFICIAL LEFT LEG VEIN STRIPPING WITH SMALL SUPERFICIAL CLOT  2002  . UMBILICAL HERNIA REPAIR  02/18/2018  . UMBILICAL HERNIA REPAIR N/A 02/18/2018   Procedure: LAPAROSCOPIC UMBILICAL HERNIA REPAIR ERAS PATHWAY;  Surgeon: [name], [name], MD;  Location: [facility];  Service: General;  Laterality: N/A;    FAMHx   Family History  Problem Relation Age of Onset  . Pancreatic cancer Mother   . Breast cancer Sister   . Epilepsy Brother   . Adrenal disorder Neg Hx     SOCHx    reports that he has never smoked. He has never used smokeless tobacco. He reports that  he does not drink alcohol or use drugs.  Outpatient Medications   No current facility-administered medications on file prior to encounter.    Current Outpatient Medications on File Prior to Encounter  Medication Sig Dispense Refill  . ascorbic acid (VITAMIN C) 1000 MG tablet Take 1,000 mg by mouth daily.     . Calcium-Magnesium-Vitamin D (CALCIUM 1200+D3 PO) Take 1 tablet by mouth 2 (two) times daily.    . celecoxib (CELEBREX) 200 MG capsule TAKE 1 CAPSULE(200 MG) BY MOUTH DAILY 30 capsule 6  . Cholecalciferol (VITAMIN D3) 2000 units TABS Take 1 tablet by mouth 2 (two) times daily.     Marland Kitchen DHEA 25 MG CAPS Take 1 capsule by mouth daily. Reported on 12/19/2015    . fluticasone  (FLONASE) 50 MCG/ACT nasal spray Place 2 sprays into both nostrils daily. 16 g 12  . Fluticasone-Salmeterol (ADVAIR DISKUS) 500-50 MCG/DOSE AEPB INHALE 1 PUFF BY MOUTH TWICE DAILY, THEN RINSE MOUTH 1 each 5  . Glucosamine-Chondroit-Vit C-Mn (GLUCOSAMINE 1500 COMPLEX) CAPS Take 1 capsule by mouth 2 (two) times daily. Reported on 12/19/2015    . montelukast (SINGULAIR) 10 MG tablet Take 1 tablet (10 mg total) by mouth at bedtime. 30 tablet 5  . Multiple Vitamin (MULTIVITAMIN) tablet Take 1 tablet by mouth 2 (two) times daily. Reported on 12/19/2015    . Omega-3 Fatty Acids (FISH OIL) 1000 MG CAPS Take 1 capsule by mouth 2 (two) times daily. Reported on 12/19/2015    . polyethylene glycol (MIRALAX / GLYCOLAX) packet Take 17 g by mouth daily as needed for mild constipation.    . vitamin B-12 (CYANOCOBALAMIN) 1000 MCG tablet Take 1,000 mcg by mouth daily.    . vitamin E (VITAMIN E) 400 UNIT capsule Take 400 Units by mouth daily. Reported on 12/19/2015    . EPINEPHrine (EPIPEN 2-PAK) 0.3 mg/0.3 mL SOAJ injection Inject 0.3 mg into the muscle once. Reported on 12/13/2015    . Tiotropium Bromide-Olodaterol (STIOLTO RESPIMAT) 2.5-2.5 MCG/ACT AERS Inhale 2 puffs into the lungs daily. (Patient not taking: Reported on 02/12/2018) 1 Inhaler 0    Inpatient Medications    Scheduled Meds: . cholecalciferol  2,000 Units Oral BID  . enoxaparin (LOVENOX) injection  40 mg Subcutaneous Q24H  . fluticasone  2 spray Each Nare Daily  . gabapentin  300 mg Oral BID  . mometasone-formoterol  2 puff Inhalation BID  . montelukast  10 mg Oral QHS  . ascorbic acid  1,000 mg Oral Daily    Continuous Infusions: . sodium chloride 50 mL/hr at 02/19/18 1929  . lactated ringers 50 mL/hr at 02/18/18 0926    PRN Meds: diphenhydrAMINE **OR** diphenhydrAMINE, hydrALAZINE, HYDROcodone-acetaminophen, ketorolac, methocarbamol, morphine injection, ondansetron **OR** ondansetron (ZOFRAN) IV, polyethylene glycol, simethicone    ALLERGIES   Allergies  Allergen Reactions  . Bee Venom Swelling  . Linzess [Linaclotide] Other (See Comments)    Abdominal pain  . Molds & Smuts   . Seasonal Ic [Cholestatin]     Environmental Allergies    ROS   Pertinent items noted in HPI and remainder of comprehensive ROS otherwise negative.  Vitals   Vitals:   02/19/18 1645 02/19/18 1942 02/19/18 2112 02/20/18 0335  BP:   103/75 123/74  Pulse: (!) 150  97 (!) 103  Resp:   17 18  Temp:   98.1 F (36.7 C) 97.9 F (36.6 C)  TempSrc:   Oral Oral  SpO2: 90% 97% 94% 96%    Intake/Output Summary (Last 24 hours)  at 02/20/2018 0942 Last data filed at 02/20/2018 1740 Gross per 24 hour  Intake 560 ml  Output -  Net 560 ml   There were no vitals filed for this visit.  Physical Exam   General appearance: alert and no distress Neck: no carotid bruit, no JVD and thyroid not enlarged, symmetric, no tenderness/mass/nodules Lungs: diminished breath sounds bilaterally Heart: irregularly irregular rhythm Abdomen: mild TTP, abdominal binder in place, post-op Extremities: extremities normal, atraumatic, no cyanosis or edema Pulses: 2+ and symmetric Skin: pale, warm, dry Neurologic: Grossly normal Psych: Pleasant  Labs   Results for orders placed or performed during the hospital encounter of 02/18/18 (from the past 48 hour(s))  CBC     Status: Abnormal   Collection Time: 02/18/18  1:54 PM  Result Value Ref Range   WBC 10.3 4.0 - 10.5 K/uL   RBC 4.50 4.22 - 5.81 MIL/uL   Hemoglobin 14.0 13.0 - 17.0 g/dL   HCT 41.1 39.0 - 52.0 %   MCV 91.3 78.0 - 100.0 fL   MCH 31.1 26.0 - 34.0 pg   MCHC 34.1 30.0 - 36.0 g/dL   RDW 13.0 11.5 - 15.5 %   Platelets 123 (L) 150 - 400 K/uL    Comment: Performed at [facility], [place] [address] [place], [place] [address]  Creatinine, serum     Status: None   Collection Time: 02/18/18  1:54 PM  Result Value Ref Range   Creatinine, Ser 1.12 0.61 - 1.24 mg/dL   GFR calc non Af Amer  >60 >60 mL/min   GFR calc Af Amer >60 >60 mL/min    Comment: (NOTE) The eGFR has been calculated using the CKD EPI equation. This calculation has not been validated in all clinical situations. eGFR's persistently <60 mL/min signify possible Chronic Kidney Disease. Performed at [facility], [place] [address], [place], [place] [address]   Basic metabolic panel     Status: Abnormal   Collection Time: 02/19/18  5:30 AM  Result Value Ref Range   Sodium 138 135 - 145 mmol/L   Potassium 3.8 3.5 - 5.1 mmol/L   Chloride 105 101 - 111 mmol/L   CO2 24 22 - 32 mmol/L   Glucose, Bld 106 (H) 65 - 99 mg/dL   BUN 20 6 - 20 mg/dL   Creatinine, Ser 1.02 0.61 - 1.24 mg/dL   Calcium 8.6 (L) 8.9 - 10.3 mg/dL   GFR calc non Af Amer >60 >60 mL/min   GFR calc Af Amer >60 >60 mL/min    Comment: (NOTE) The eGFR has been calculated using the CKD EPI equation. This calculation has not been validated in all clinical situations. eGFR's persistently <60 mL/min signify possible Chronic Kidney Disease.    Anion gap 9 5 - 15    Comment: Performed at [facility] [address], [place], [place] [address]  CBC     Status: Abnormal   Collection Time: 02/19/18  5:30 AM  Result Value Ref Range   WBC 10.9 (H) 4.0 - 10.5 K/uL   RBC 4.52 4.22 - 5.81 MIL/uL   Hemoglobin 14.3 13.0 - 17.0 g/dL   HCT 41.6 39.0 - 52.0 %   MCV 92.0 78.0 - 100.0 fL   MCH 31.6 26.0 - 34.0 pg   MCHC 34.4 30.0 - 36.0 g/dL   RDW 13.1 11.5 - 15.5 %   Platelets 128 (L) 150 - 400 K/uL    Comment: Performed at [facility], [place] [address], [place], [place] [address]    ECG   A-fib with RVR at 108- Personally Reviewed  Telemetry   Atrial fibrillation - Personally Reviewed  Radiology   Dg Chest Port 1 View  Result Date: 02/19/2018 CLINICAL DATA:  Shortness of breath EXAM: PORTABLE CHEST 1 VIEW COMPARISON:  08/21/2015 FINDINGS: Shallow inspiration. Mild cardiac enlargement. No vascular congestion or edema. Mild right  hilar prominence is likely vascular. No blunting of costophrenic angles. No pneumothorax. Calcification of the aorta. IMPRESSION: Shallow inspiration. Cardiac enlargement. No evidence of active pulmonary disease. Electronically Signed   By: Lucienne Capers M.D.   On: 02/19/2018 06:01    Cardiac Studies   N/A  Impression   Principal Problem:   Umbilical hernia Active Problems:   New onset a-fib (HCC)   Recommendation   1. Kristopher Gay has new onset a-fib with RVR. He does not seem aware of it. He had an echo in 2013 which showed LVEF 65-70%, mild DD, moderate LAE and mild RAE. I will repeat this study. He will need better rate control and anticoagulation. Start Toprol XL 25 mg daily today. Will start on Eliquis 5 mg BID (creatinine 1.02) - his CHADSVASC score is 2 (age, HTN). Plan is for likely minimum of 3 weeks of rate control and anticoagulation and will re-evaluate for possible outpatient cardioversion. He may need antiarrhythmic medication, given his atrial enlargement which is probably worse now than in 2013.  Thanks for the consultation. Will follow with you and see him again tomorrow.  Time Spent Directly with Patient:  I have spent a total of 45 minutes with the patient reviewing hospital notes, telemetry, EKGs, labs and examining the patient as well as establishing an assessment and plan that was discussed personally with the patient. > 50% of time was spent in direct patient care.  Length of Stay:  LOS: 0 days   [name], MD, [facility], [place] Director of the Advanced Lipid Disorders &  Cardiovascular Risk Reduction Clinic Diplomate of the American Board of Clinical Lipidology Attending Cardiologist  Direct Dial: [phone]  Fax: [phone]  Website:  [email or web address] Elfrida Pixley 02/20/2018, 9:42 AM

## 2018-02-20 NOTE — Progress Notes (Signed)
Whiles sitting at the nurses station , I noticed patient heart rate increased to 130's. Upon entering patient's room he was found out of the chair going into the bathroom. Nurse requested to help patient. Patient yelled at nurse stating " leave me alone". Patient informed again he is a high for falling since he has to pull his IV pole and walk with a walker at the same time.

## 2018-02-21 ENCOUNTER — Other Ambulatory Visit (HOSPITAL_COMMUNITY): Payer: Medicare Other

## 2018-02-21 DIAGNOSIS — I4891 Unspecified atrial fibrillation: Secondary | ICD-10-CM | POA: Diagnosis not present

## 2018-02-21 DIAGNOSIS — K429 Umbilical hernia without obstruction or gangrene: Secondary | ICD-10-CM

## 2018-02-21 NOTE — Progress Notes (Signed)
   02/21/18 1426  Vitals  Temp (!) 97.3 F (36.3 C)  Temp Source Oral  BP (!) 129/94  BP Location Left Arm  BP Method Automatic  Patient Position (if appropriate) Sitting  Pulse Rate 77  Pulse Rate Source Monitor  Resp (!) 24  Oxygen Therapy  SpO2 98 %  O2 Device Nasal Cannula  O2 Flow Rate (L/min) 3.5 L/min  Pulse Oximetry Type Continuous  Pt ambulated 260 feet on unit. Walked about 40 feet on room air before oxygen sats dropped to 85%. 3.5 liters oxygen restarted and saturations slowly went back up to 95%. Pt encouraged to deep breath. Pt completed rest of walk on oxygen. Tolerated walking with walker fairly well, no signs of fainting or dizziness reported, pt just stated that he felt worn out when he returned to his room

## 2018-02-21 NOTE — Progress Notes (Signed)
Patient called this RN that his blood pressure was low,and he was not feeling good. Upon assesement BP was 118/80 HR 82. Patient voiced a concern about him been on oxygen, patient given an incentive spirometer and educated on how to use it, he verbalized understanding but refused to use the incentive. Incentive spirometer left on the bedside table, patient reminded to call for help before getting up from the bed or chair, call bell within reach, no sign of distress noted will continue to monitor.

## 2018-02-21 NOTE — Progress Notes (Signed)
   DAILY PROGRESS NOTE   Patient Name: Kristopher Gay Date of Encounter: 02/21/2018  Chief Complaint   No significant complaints  Patient Profile    67 yo male s/p laparoscopic hernia repair, noted to have new onset afib.  Subjective   No issues overnight - echo pending. HR control improved - on Eliquis.  Objective   Vitals:   02/20/18 2039 02/20/18 2049 02/21/18 0420 02/21/18 0539  BP:  119/75 118/80 96/67  Pulse:  69 82 (!) 101  Resp:      Temp:  98.9 F (37.2 C)  98.4 F (36.9 C)  TempSrc:  Oral  Oral  SpO2: 95% 96%  95%    Intake/Output Summary (Last 24 hours) at 02/21/2018 1159 Last data filed at 02/21/2018 1015 Gross per 24 hour  Intake 3654.17 ml  Output -  Net 3654.17 ml   There were no vitals filed for this visit.  Physical Exam   General appearance: alert and no distress Neck: no carotid bruit, no JVD and thyroid not enlarged, symmetric, no tenderness/mass/nodules Lungs: clear to auscultation bilaterally Heart: regular rate and rhythm Abdomen: soft, non-tender; bowel sounds normal; no masses,  no organomegaly Extremities: extremities normal, atraumatic, no cyanosis or edema Pulses: 2+ and symmetric Skin: Skin color, texture, turgor normal. No rashes or lesions Neurologic: Grossly normal Psych: Pleasant  Inpatient Medications    Scheduled Meds: . apixaban  5 mg Oral BID  . cholecalciferol  2,000 Units Oral BID  . fluticasone  2 spray Each Nare Daily  . gabapentin  300 mg Oral BID  . metoprolol succinate  25 mg Oral Daily  . mometasone-formoterol  2 puff Inhalation BID  . montelukast  10 mg Oral QHS  . ascorbic acid  1,000 mg Oral Daily    Continuous Infusions: . sodium chloride 50 mL/hr at 02/21/18 0905  . lactated ringers 50 mL/hr at 02/18/18 0926    PRN Meds: diphenhydrAMINE **OR** diphenhydrAMINE, hydrALAZINE, HYDROcodone-acetaminophen, ketorolac, methocarbamol, morphine injection, ondansetron **OR** ondansetron (ZOFRAN) IV,  polyethylene glycol, simethicone   Labs   No results found for this or any previous visit (from the past 48 hour(s)).  ECG   N/A  Telemetry   A-fib with CVR - Personally Reviewed  Radiology    No results found.  Cardiac Studies   Pending  Assessment   1. Principal Problem: 2.   Umbilical hernia 3. Active Problems: 4.   New onset a-fib ([place]) 5.   Plan   Generally rate-controlled a-fib. On metoprolol and Eliquis. Echo pending, but should not hold up discharge if it cannot be performed by mid-afternoon.  We will arrange follow-up in 3-4 weeks in the office and consider cardioversion at that point if he has been consistently taking Eliquis and remains in a-fib.  Time Spent Directly with Patient:  I have spent a total of 15 minutes with the patient reviewing hospital notes, telemetry, EKGs, labs and examining the patient as well as establishing an assessment and plan that was discussed personally with the patient. > 50% of time was spent in direct patient care.  Length of Stay:  LOS: 0 days   Kristopher Casino, MD, [facility], [place] Director of the Advanced Lipid Disorders &  Cardiovascular Risk Reduction Clinic Diplomate of the American Board of Clinical Lipidology Attending Cardiologist  Direct Dial: [phone]  Fax: [phone]  Website:  [email or web address] Kristopher Gay 02/21/2018, 11:59 AM

## 2018-02-21 NOTE — Progress Notes (Signed)
3 Days Post-Op  Subjective: Alert.  In no distress. Tolerating diet.  Had a bowel movement. Anxious about his blood pressure.  He puts the blood pressure cuff on himself and checks his blood pressure. BP 96/67 up to 119/75. SPO2 95%. Heart rate 100-1 28, irregular, atrial fibrillation  Appreciate cardiology advised by Dr. Debara Pickett.  Now on Toprol XL 25 mg daily, Eliquis 5 mg twice daily.  Echocardiogram pending. Denies chest pain.  Lab work yesterday looked fine.  Hemoglobin 14.3.  WBC 10,900.  Creatinine 1.02.  Potassium 3.8.  Objective: Vital signs in last 24 hours: Temp:  [98 F (36.7 C)-98.9 F (37.2 C)] 98.4 F (36.9 C) (04/28 0539) Pulse Rate:  [69-101] 101 (04/28 0539) Resp:  [18] 18 (04/27 1300) BP: (96-119)/(67-80) 96/67 (04/28 0539) SpO2:  [93 %-96 %] 95 % (04/28 0539) Last BM Date: 02/20/18  Intake/Output from previous day: 04/27 0701 - 04/28 0700 In: 3614.2 [P.O.:680; I.V.:2934.2] Out: -  Intake/Output this shift: No intake/output data recorded.   PE: General appearance: Alert.  No distress.    Some anxiety oriented.  Skin warm and dry. Resp: clear to auscultation bilaterally Cardio: Heart rate 103-128.  Irregular. GI: Soft.  Wounds look good.  Appropriately tender.  Active bowel sounds.  Not distended.      Lab Results:  No results found for this or any previous visit (from the past 24 hour(s)).   Studies/Results: No results found.  Marland Kitchen apixaban  5 mg Oral BID  . cholecalciferol  2,000 Units Oral BID  . fluticasone  2 spray Each Nare Daily  . gabapentin  300 mg Oral BID  . metoprolol succinate  25 mg Oral Daily  . mometasone-formoterol  2 puff Inhalation BID  . montelukast  10 mg Oral QHS  . ascorbic acid  1,000 mg Oral Daily     Assessment/Plan: s/p Procedure(s): LAPAROSCOPIC UMBILICAL HERNIA REPAIR ERAS PATHWAY   POD #3.  Laparoscopic periumbilical hernia repair -Wounds look fine -Diet as tolerated -Ambulate with rolling walker per PT  recommendations -Doing well surgically -Encourage ambulation  New onset atrial fibrillation with RVR. -Asymptomatic -Eliquis, beta-blockers, echocardiogram -Continue telemetry -Appreciate assessment and management by Dr. Benay Spice 4/26   -Question whether this was near syncope -Hemodynamically stable -No clinical evidence of injury  Depression COPD and asthma.  Not hypoxic Polyneuropathy Hypertension-currently not an issue.  Low-dose beta-blocker is all that he is taking right now.    @PROBHOSP @  LOS: 0 days    Adin Hector 02/21/2018  . .prob

## 2018-02-22 ENCOUNTER — Telehealth: Payer: Self-pay | Admitting: *Deleted

## 2018-02-22 ENCOUNTER — Observation Stay (HOSPITAL_BASED_OUTPATIENT_CLINIC_OR_DEPARTMENT_OTHER): Payer: Medicare Other

## 2018-02-22 DIAGNOSIS — I361 Nonrheumatic tricuspid (valve) insufficiency: Secondary | ICD-10-CM

## 2018-02-22 DIAGNOSIS — K429 Umbilical hernia without obstruction or gangrene: Secondary | ICD-10-CM | POA: Diagnosis not present

## 2018-02-22 DIAGNOSIS — I4891 Unspecified atrial fibrillation: Secondary | ICD-10-CM | POA: Diagnosis not present

## 2018-02-22 LAB — ECHOCARDIOGRAM COMPLETE

## 2018-02-22 MED ORDER — APIXABAN 5 MG PO TABS: 5.0000 mg | ORAL_TABLET | Freq: Two times a day (BID) | ORAL | 0 refills | Status: DC

## 2018-02-22 MED ORDER — IBUPROFEN 800 MG PO TABS: 800.0000 mg | ORAL_TABLET | Freq: Three times a day (TID) | ORAL | 0 refills | Status: DC | PRN

## 2018-02-22 MED ORDER — HYDROCODONE-ACETAMINOPHEN 5-325 MG PO TABS: 1.0000 | ORAL_TABLET | Freq: Four times a day (QID) | ORAL | 0 refills | Status: DC | PRN

## 2018-02-22 MED ORDER — METOPROLOL SUCCINATE ER 25 MG PO TB24: 25.0000 mg | ORAL_TABLET | Freq: Every day | ORAL | 0 refills | Status: DC

## 2018-02-22 NOTE — Discharge Summary (Signed)
Physician Discharge Summary  Patient ID: Kristopher Gay MRN: [id number] DOB/AGE: [birth year] 67 y.o.  Admit date: 02/18/2018 Discharge date: 02/22/2018  Admission Diagnoses:  Discharge Diagnoses:  Principal Problem:   Umbilical hernia Active Problems:   New onset a-fib [facility])   Discharged Condition: good  Hospital Course: 67 yo male underwent lap umbilical hernia repair with mesh. Post op he developed a fib and was treated with eliquis and beta blockade. Cardiology was consulted. He underwent echo. He reverted to NSR on his own and was discharged home POd 4.  Consults: cardiology  Significant Diagnostic Studies:   Treatments: lap umbilical hernia repair, ECHO, anticoagulation  Discharge Exam: Blood pressure 119/79, pulse 70, temperature 98.6 F (37 C), temperature source Oral, resp. rate (!) 22, SpO2 95 %. General appearance: alert and cooperative Head: Normocephalic, without obvious abnormality, atraumatic Resp: clear to auscultation bilaterally Cardio: regular rate and rhythm, S1, S2 normal, no murmur, click, rub or gallop GI: incisions c/d/i, minimal pain  Disposition: Discharge disposition: 01-Home or Self Care       Discharge Instructions    Call MD for:  difficulty breathing, headache or visual disturbances   Complete by:  As directed    Call MD for:  hives   Complete by:  As directed    Call MD for:  persistant nausea and vomiting   Complete by:  As directed    Call MD for:  redness, tenderness, or signs of infection (pain, swelling, redness, odor or green/yellow discharge around incision site)   Complete by:  As directed    Call MD for:  severe uncontrolled pain   Complete by:  As directed    Call MD for:  temperature >100.4   Complete by:  As directed    Diet - low sodium heart healthy   Complete by:  As directed    Discharge wound care:   Complete by:  As directed    Ok to shower tomorrow. Glue will likely peel off in 1-3 weeks. No bandage required   Driving Restrictions   Complete by:  As directed    No driving while on narcotics   Increase activity slowly   Complete by:  As directed    Lifting restrictions   Complete by:  As directed    No lifting greater than 20 pounds for 3 weeks     Allergies as of 02/22/2018      Reactions   Bee Venom Swelling   Linzess [linaclotide] Other (See Comments)   Abdominal pain   Molds & Smuts    Seasonal Ic [cholestatin]    Environmental Allergies      Medication List    TAKE these medications   apixaban 5 MG Tabs tablet Commonly known as:  ELIQUIS Take 1 tablet (5 mg total) by mouth 2 (two) times daily.   ascorbic acid 1000 MG tablet Commonly known as:  VITAMIN C Take 1,000 mg by mouth daily.   CALCIUM 1200+D3 PO Take 1 tablet by mouth 2 (two) times daily.   celecoxib 200 MG capsule Commonly known as:  CELEBREX TAKE 1 CAPSULE(200 MG) BY MOUTH DAILY Notes to patient:  I recommend holding the celecoxib while taking the ibuprofen   DHEA 25 MG Caps Take 1 capsule by mouth daily. Reported on 12/19/2015   EPIPEN 2-PAK 0.3 mg/0.3 mL Soaj injection Generic drug:  EPINEPHrine Inject 0.3 mg into the muscle once. Reported on 12/13/2015   Fish Oil 1000 MG Caps Take 1 capsule by mouth 2 (  two) times daily. Reported on 12/19/2015   fluticasone 50 MCG/ACT nasal spray Commonly known as:  FLONASE Place 2 sprays into both nostrils daily.   Fluticasone-Salmeterol 500-50 MCG/DOSE Aepb Commonly known as:  ADVAIR DISKUS INHALE 1 PUFF BY MOUTH TWICE DAILY, THEN RINSE MOUTH   GLUCOSAMINE 1500 COMPLEX Caps Take 1 capsule by mouth 2 (two) times daily. Reported on 12/19/2015   ibuprofen 800 MG tablet Commonly known as:  ADVIL,MOTRIN Take 1 tablet (800 mg total) by mouth every 8 (eight) hours as needed.   ibuprofen 800 MG tablet Commonly known as:  ADVIL,MOTRIN Take 1 tablet (800 mg total) by mouth every 8 (eight) hours as needed.   methocarbamol 500 MG tablet Commonly known as:  ROBAXIN Take  1 tablet (500 mg total) by mouth every 6 (six) hours as needed for muscle spasms.   metoprolol succinate 25 MG 24 hr tablet Commonly known as:  TOPROL-XL Take 1 tablet (25 mg total) by mouth daily. Start taking on:  02/23/2018   montelukast 10 MG tablet Commonly known as:  SINGULAIR Take 1 tablet (10 mg total) by mouth at bedtime.   multivitamin tablet Take 1 tablet by mouth 2 (two) times daily. Reported on 12/19/2015   polyethylene glycol packet Commonly known as:  MIRALAX / GLYCOLAX Take 17 g by mouth daily as needed for mild constipation.   Tiotropium Bromide-Olodaterol 2.5-2.5 MCG/ACT Aers Commonly known as:  STIOLTO RESPIMAT Inhale 2 puffs into the lungs daily.   vitamin B-12 1000 MCG tablet Commonly known as:  CYANOCOBALAMIN Take 1,000 mcg by mouth daily.   Vitamin D3 2000 units Tabs Take 1 tablet by mouth 2 (two) times daily.   vitamin E 400 UNIT capsule Generic drug:  vitamin E Take 400 Units by mouth daily. Reported on 12/19/2015            Discharge Care Instructions  (From admission, onward)        Start     Ordered   02/22/18 0000  Discharge wound care:    Comments:  Ok to shower tomorrow. Glue will likely peel off in 1-3 weeks. No bandage required   02/22/18 1556     Follow-up Information    Barrett, Evelene Croon, PA-C Follow up.   Specialties:  Cardiology, Radiology Why:  Cardiology hospital follow up on May 29th at 9:30. Please arrive 15 minutes early for check in.  Contact information: [address] [place] [address] [phone]           Signed: [name] [name] 02/22/2018, 3:57 PM

## 2018-02-22 NOTE — Progress Notes (Addendum)
Progress Note  Patient Name: Kristopher Gay Date of Encounter: 02/22/2018  Primary Cardiologist: [name], MD - New  Subjective   No chest pain, shortness of breath, palpitations, lightheadedness. He is still on oxygen.   Inpatient Medications    Scheduled Meds: . apixaban  5 mg Oral BID  . cholecalciferol  2,000 Units Oral BID  . fluticasone  2 spray Each Nare Daily  . gabapentin  300 mg Oral BID  . metoprolol succinate  25 mg Oral Daily  . mometasone-formoterol  2 puff Inhalation BID  . montelukast  10 mg Oral QHS  . ascorbic acid  1,000 mg Oral Daily   Continuous Infusions: . sodium chloride 50 mL/hr at 02/21/18 0905  . lactated ringers 50 mL/hr at 02/18/18 0926   PRN Meds: diphenhydrAMINE **OR** diphenhydrAMINE, hydrALAZINE, HYDROcodone-acetaminophen, ketorolac, methocarbamol, morphine injection, ondansetron **OR** ondansetron (ZOFRAN) IV, polyethylene glycol, simethicone   Vital Signs    Vitals:   02/21/18 2128 02/22/18 0013 02/22/18 0608 02/22/18 0819  BP:  128/85 119/71   Pulse:  63 64   Resp:  (!) 22 (!) 22   Temp:   97.6 F (36.4 C)   TempSrc:   Oral   SpO2: 99% 96% 96% 97%    Intake/Output Summary (Last 24 hours) at 02/22/2018 1311 Last data filed at 02/22/2018 0321 Gross per 24 hour  Intake 717.5 ml  Output -  Net 717.5 ml   There were no vitals filed for this visit.  Telemetry    Converted from afib to SR last night around 7 pm. Maintaining SR in 60's-70's - Personally Reviewed  ECG    No new tracings.  - Personally Reviewed  Physical Exam   GEN: No acute distress.   Neck: No JVD Cardiac: RRR, no murmurs, rubs, or gallops.  Respiratory: Clear to auscultation bilaterally. GI: Soft, nontender, non-distended  MS: Bilat trace-1+ ankle edema; No deformity. Neuro:  Nonfocal  Psych: Normal affect   Labs    Chemistry Recent Labs  Lab 02/18/18 1354 02/19/18 0530  NA  --  138  K  --  3.8  CL  --  105  CO2  --  24  GLUCOSE  --   106*  BUN  --  20  CREATININE 1.12 1.02  CALCIUM  --  8.6*  GFRNONAA >60 >60  GFRAA >60 >60  ANIONGAP  --  9     Hematology Recent Labs  Lab 02/18/18 1354 02/19/18 0530  WBC 10.3 10.9*  RBC 4.50 4.52  HGB 14.0 14.3  HCT 41.1 41.6  MCV 91.3 92.0  MCH 31.1 31.6  MCHC 34.1 34.4  RDW 13.0 13.1  PLT 123* 128*    Cardiac EnzymesNo results for input(s): TROPONINI in the last 168 hours. No results for input(s): TROPIPOC in the last 168 hours.   BNPNo results for input(s): BNP, PROBNP in the last 168 hours.   DDimer No results for input(s): DDIMER in the last 168 hours.   Radiology    No results found.  Cardiac Studies   Echo pending  Echo 09/2012:  Mild LVH, EF 65-70%, grade 1 DD, mod dilated LA.  Patient Profile     67 y.o. male s/p laparoscopic hernia repair, noted to have new onset afib.   Assessment & Plan    Paroxysmal atrial fibrillation -New onset, postoperatively. Pt had no awareness. It was noted by PT with irregular heart rhythm.  -On metoprolol and converted to sinus rhythm around 7 pm  last night and maintaining sinus rhythm with occ PAC's in the 60's-70's. -CHA2DS2/VAS Stroke Risk Score is at least 2 for age, HTN.  Currently anticoagulated with Eliquis 5 mg BID.  -last echo in 2013 showed normal LV systolic function but moderately dilated LA. Updated Echo is in progress.  -Fortunately he converted to sinus rhythm, No need for cardioversion at this time.  Hopefully he can maintain this. His dilated LA makes him more likely to develop afib. Will reassess his level of atrial dilatation on echo.  -Will arrange for follow up in our office.     For questions or updates, please contact [place] Please consult [email or web address] for contact info under Cardiology/STEMI.      Signed, Daune Perch, NP  02/22/2018, 1:11 PM    Personally seen and examined. Agree with above.  Mild O2 needs. Walked earlier with desat. No CP, no SOB. Just tired  GEN: Well  nourished, well developed, in no acute distress  HEENT: normal  Neck: no JVD, carotid bruits, or masses Cardiac: RRR; no murmurs, rubs, or gallops, 1+ BLE edema  Respiratory:  clear to auscultation bilaterally, normal work of breathing GI: soft, nontender, nondistended, + BS MS: no deformity or atrophy  Skin: warm and dry, no rash Neuro:  Alert and Oriented x 3, Strength and sensation are intact Psych: euthymic mood, full affect  Post op AFIB, parox  - Eliquis, metoprolol. No changes. In NSR now.   - dilated LA can predispose to afib.   - mild LE edema. Should improve with ambulation and elevation.   Will set up follow up. Please let us know if we can be of further assistance.   Candee Furbish, MD

## 2018-02-22 NOTE — Progress Notes (Signed)
Physical Therapy Treatment Patient Details Name: Kristopher Gay MRN: [id number] DOB: 1951/10/04 Today's Date: 02/22/2018    History of Present Illness Kristopher Gay is a 67yo male who comes to [facility] for inguinal/umbilical hernia repair. Evening after surgery, pt AMB to [facility] with RN and sustained a syncopal episode, with subsequent breathlessness, desaturation, and irregular heart beat. PTA pt reports only mild difficulty with community distance AMB with sPC or shopping buggy. No prior episodes. No cardiac diagnoses. Pt reports COPD but never required O2 supplementation previosuly.     PT Comments    Pt is very anxious and fearful of going home by himself.  He is afraid his O2 sats will drop unexpectedly or his heart with convert back into a-fib and he will die.  He has no one at home and he is generally weak and deconditioned from his hospitalization.  He is open to pursuing SNF level rehab, but I am unsure if he qualifies.  If he does not, max home health services (HHPT, OT, aide) and RW.  PT will continue to follow acutely.  O2 sats and HR were stable on RA during gait.   Follow Up Recommendations  SNF     Equipment Recommendations  Rolling walker with 5" wheels    Recommendations for Other Services   NA     Precautions / Restrictions Precautions Precautions: Fall Precaution Comments: monitor O2 sats and HR    Mobility  Bed Mobility               General bed mobility comments: Pt is OOB in the recliner chair.    Transfers Overall transfer level: Needs assistance Equipment used: Rolling walker (2 wheeled) Transfers: Sit to/from Stand Sit to Stand: Supervision         General transfer comment: supervision for safety.  Slow speed of transitions.  used hands to control ascent and descent.   Ambulation/Gait Ambulation/Gait assistance: Min guard Ambulation Distance (Feet): 45 Feet(x2 with seated rest break due to pt feeling weak) Assistive device: Rolling walker (2  wheeled) Gait Pattern/deviations: Step-through pattern;Shuffle;Trunk flexed     General Gait Details: Pt with slow, shuffling gait. HR and O2 sats 70s-80 in sinus rhythm and O2 sats 94% on RA during gait.            Balance Overall balance assessment: Needs assistance Sitting-balance support: Feet supported;No upper extremity supported Sitting balance-Leahy Scale: Good     Standing balance support: Bilateral upper extremity supported;No upper extremity supported Standing balance-Leahy Scale: Fair                              Cognition Arousal/Alertness: Awake/alert Behavior During Therapy: Anxious Overall Cognitive Status: Within Functional Limits for tasks assessed                                 General Comments: Pt is a math and physics major.  He is a very anylitical personality and when he saw on the monitor both his HR and O2 sats were abnormal it scared him and now he is sccared both not to be monitored and to be off of O2 despite frequent good readings.       Exercises General Exercises - Lower Extremity Long Arc Quad: AROM;Both;10 reps Hip ABduction/ADduction: AROM;Both;10 reps Hip Flexion/Marching: AROM;Both;10 reps Toe Raises: AROM;Both;10 reps Heel Raises: AROM;Both;10 reps  Pertinent Vitals/Pain Pain Assessment: Faces Faces Pain Scale: Hurts little more Pain Location: abdomen Pain Descriptors / Indicators: Grimacing;Guarding Pain Intervention(s): Limited activity within patient's tolerance;Monitored during session;Repositioned           PT Goals (current goals can now be found in the care plan section) Acute Rehab PT Goals Patient Stated Goal: to not go home as he feels too weak and he is scared. Progress towards PT goals: Progressing toward goals    Frequency    Min 3X/week      PT Plan Discharge plan needs to be updated       AM-PAC PT "6 Clicks" Daily Activity  Outcome Measure  Difficulty turning  over in bed (including adjusting bedclothes, sheets and blankets)?: A Little Difficulty moving from lying on back to sitting on the side of the bed? : A Lot Difficulty sitting down on and standing up from a chair with arms (e.g., wheelchair, bedside commode, etc,.)?: A Little Help needed moving to and from a bed to chair (including a wheelchair)?: A Little Help needed walking in hospital room?: A Little Help needed climbing 3-5 steps with a railing? : A Lot 6 Click Score: 16    End of Session   Activity Tolerance: Patient limited by fatigue;Patient limited by pain;Other (comment)(limited by anxiety and fear of going home too soon) Patient left: in chair;with call bell/phone within reach Nurse Communication: Mobility status PT Visit Diagnosis: Muscle weakness (generalized) (M62.81);Difficulty in walking, not elsewhere classified (R26.2)     Time: 2725-3664 PT Time Calculation (min) (ACUTE ONLY): 50 min  Charges:  $Gait Training: 8-22 mins $Therapeutic Exercise: 8-22 mins $Self Care/Home Management: 8-22          Darah Simkin B. [place], [place], DPT [phone]            02/22/2018, 6:13 PM

## 2018-02-22 NOTE — Progress Notes (Signed)
02/22/2018  SATURATION QUALIFICATIONS: (This note is used to comply with regulatory documentation for home oxygen)  Patient Saturations on Room Air at Rest = 95%  Patient Saturations on Room Air while Ambulating = 94%  Patient Saturations on 0 Liters of oxygen while Ambulating = 94%  Please briefly explain why patient needs home oxygen:Pt does not currently need home O2.   Barbarann Ehlers [place], [place], DPT [phone]

## 2018-02-22 NOTE — Telephone Encounter (Signed)
Called to schedule post hospital visit----mail box full

## 2018-02-22 NOTE — Progress Notes (Signed)
Dr. Kieth Brightly ordered for discharge- Patient not comfortable leaving. He states he his O2 dropped to 85 on room air, he is weak and is concerned he is going to get home and his O2 drop. I notified Dr. Kieth Brightly, he stated he will not be removing the discharge order, it will be today or tomorrow. Also, stated to remove the pulse ox monitor.   Informed the patient the Dr. Aletha Halim not be removing the discharge order and he discontinued the pulse ox monitor. Patient refused for the monitor to be removed.

## 2018-02-22 NOTE — Progress Notes (Signed)
SATURATION QUALIFICATIONS: (This note is used to comply with regulatory documentation for home oxygen)  Patient Saturations on Room Air at Rest = 94%  Patient Saturations on Room Air while Ambulating = 94%  Patient Saturations on 0 Liters of oxygen while Ambulating = 94%  Please briefly explain why patient needs home oxygen: 

## 2018-02-22 NOTE — Progress Notes (Signed)
   02/22/18 0013  Mobility  Activity Ambulated in hall  Level of Assistance Standby assist, set-up cues, supervision of patient - no hands on  Assistive Device Front wheel walker  Minutes Ambulated 15 minutes  Distance Ambulated (ft) 80 ft  Mobility Response Tolerated fair   Patient ambulated in hallway with walker with standby assist from RN and CNA. Walking away from the room, patient's O2 sat began to desat down to 85% on room air after approximately 20 ft. Patient placed 3L O2 via nasal cannula and continued to walk another 20 ft; with 3L O2, patient's O2 sat fluctuated between 88-96%. Patient then decided to return to room. Upon ambulating back to room, patient removed 3L O2 [facility] and ambulated back to room on room air. Patient took deep breaths while walking back to room, and O2 sat remained between 91-95% on room air. Patient ambulated total 80 ft.

## 2018-02-22 NOTE — Progress Notes (Signed)
  Echocardiogram 2D Echocardiogram has been performed.  Jennette Dubin 02/22/2018, 9:55 AM

## 2018-02-22 NOTE — Progress Notes (Signed)
  Progress Note: General Surgery Service   Assessment/Plan: Patient Active Problem List   Diagnosis Date Noted  . New onset a-fib ([place]) 02/20/2018  . Umbilical hernia 54/98/2641  . Hernia of abdominal wall 07/20/2017  . Chronic right shoulder pain 05/15/2016  . Right knee pain 05/15/2016  . Adrenal adenoma 02/20/2016  . Chronic pain syndrome 10/12/2015  . Odynophagia 05/22/2015  . Dysphagia, pharyngoesophageal phase 09/14/2014  . Colon cancer screening 09/14/2014  . Other chronic postoperative pain 01/16/2014  . Allergy, insect bite 07/02/2012  . GERD (gastroesophageal reflux disease) 03/25/2012  . 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 Lumbosacral spondylosis 01/13/2012  . Chronic back pain 11/17/2011  . Seasonal and perennial allergic rhinitis 12/26/2010  . MUSCULOSKELETAL PAIN 04/16/2009  . Asthmatic bronchitis , chronic ([place]) 01/30/2008   s/p Procedure(s): LAPAROSCOPIC UMBILICAL HERNIA REPAIR [facility] 02/18/2018 New onset a fib - dilated LA on ECHO today -to start eliquis and f/u with heartcare  Oxygen requirement -ambulate and check ambulating oxygen  -if <85% then diurese and retry in am  -if >85% plan to discharge    LOS: 0 days  Chief Complaint/Subjective: Pain well controlled, no chest pain, tolerating diet, having bowel movements  Objective: Vital signs in last 24 hours: Temp:  [97.6 F (36.4 C)-98.1 F (36.7 C)] 97.6 F (36.4 C) (04/29 5830) Pulse Rate:  [63-70] 64 (04/29 0608) Resp:  [22-24] 22 (04/29 0608) BP: (119-128)/(71-85) 119/71 (04/29 0608) SpO2:  [96 %-99 %] 97 % (04/29 0819) Last BM Date: 02/20/18  Intake/Output from previous day: 04/28 0701 - 04/29 0700 In: 957.5 [P.O.:540; I.V.:417.5] Out: -  Intake/Output this shift: No intake/output data recorded.  Gen: NAD  Lungs: CTAB  Cardiovascular: RRR  Abd: soft, ATTP, ND, incisions c/d/i  Extremities: trace edema  Neuro: AOx4    Mickeal Skinner, MD [facility]

## 2018-02-23 DIAGNOSIS — K429 Umbilical hernia without obstruction or gangrene: Secondary | ICD-10-CM | POA: Diagnosis not present

## 2018-02-23 MED ORDER — FUROSEMIDE 40 MG PO TABS
40.0000 mg | ORAL_TABLET | Freq: Once | ORAL | Status: AC
  Administered 2018-02-23: 40 mg via ORAL
  Filled 2018-02-23: qty 1

## 2018-02-23 NOTE — Progress Notes (Signed)
Discharge instructions reviewed with the Patient.  Pt advised on where to pick up his prescriptions, and what medications he will continue to take when leaving hospital. Discussed with Pt Home PT/OT he advised that MD was discharging him home and he did not see why he would need PT at home - Pt declined home PT.  Offered Pt cab voucher to go home with, and pick up his car at later date - Pt declined cab voucher.

## 2018-02-23 NOTE — Progress Notes (Signed)
Pt received his rolling walker, and then left via w/c with volunteer.

## 2018-02-23 NOTE — [place] FL2 (Signed)
[place] MEDICAID FL2 LEVEL OF CARE SCREENING TOOL     IDENTIFICATION  Patient Name: Kristopher Gay Birthdate: 12-Jan-1951 Sex: male Admission Date (Current Location): 02/18/2018  [facility] and [place] Number:  Herbalist and Address:  [facility], [place] [address], [place], [place] [address]      Provider Number: [id number]  Attending Physician Name and Address:  [name], [name], MD  Relative Name and Phone Number:       Current Level of Care: Hospital Recommended Level of Care: [place] Prior Approval Number:    Date Approved/Denied:   PASRR Number:    Discharge Plan: SNF    Current Diagnoses: Patient Active Problem List   Diagnosis Date Noted  . New onset a-fib ([place]) 02/20/2018  . Umbilical hernia 00/86/7619  . Hernia of abdominal wall 07/20/2017  . Chronic right shoulder pain 05/15/2016  . Right knee pain 05/15/2016  . Adrenal adenoma 02/20/2016  . Chronic pain syndrome 10/12/2015  . Odynophagia 05/22/2015  . Dysphagia, pharyngoesophageal phase 09/14/2014  . Colon cancer screening 09/14/2014  . Other chronic postoperative pain 01/16/2014  . Allergy, insect bite 07/02/2012  . GERD (gastroesophageal reflux disease) 03/25/2012  . 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 Lumbosacral spondylosis 01/13/2012  . Chronic back pain 11/17/2011  . Seasonal and perennial allergic rhinitis 12/26/2010  . MUSCULOSKELETAL PAIN 04/16/2009  . Asthmatic bronchitis , chronic ([place]) 01/30/2008    Orientation RESPIRATION BLADDER Height & Weight     Self, Time, Situation, Place  O2(intermittant nasal canula ) Continent Weight: 250 lb 14.1 oz (113.8 kg) Height:  6\' 2"  (188 cm)  BEHAVIORAL SYMPTOMS/MOOD NEUROLOGICAL BOWEL NUTRITION STATUS      Continent Diet(see discharge summary)  AMBULATORY STATUS COMMUNICATION OF NEEDS Skin   Limited Assist Verbally Surgical wounds(liquid skin adhesive on abdominal surgical site; abdominal  binder; 3 abdominal ports)                       Personal Care Assistance Level of Assistance  Bathing, Feeding, Dressing Bathing Assistance: Limited assistance Feeding assistance: Independent Dressing Assistance: Limited assistance     Functional Limitations Info  Sight, Hearing, Speech Sight Info: Adequate(wears glasses) Hearing Info: Adequate Speech Info: Adequate    SPECIAL CARE FACTORS FREQUENCY  PT (By licensed PT), OT (By licensed OT)     PT Frequency: 3x week OT Frequency: 2x week            Contractures Contractures Info: Not present    Additional Factors Info  Code Status, Allergies Code Status Info: Full Code Allergies Info: BEE VENOM, LINZESS LINACLOTIDE, MOLDS & SMUTS, SEASONAL IC CHOLESTATIN            Current Medications (02/23/2018):  This is the current hospital active medication list Current Facility-Administered Medications  Medication Dose Route Frequency Provider Last Rate Last Dose  . 0.9 %  sodium chloride infusion   Intravenous Continuous [name], [name], MD   Stopped at 02/22/18 1900  . apixaban (ELIQUIS) tablet 5 mg  5 mg Oral BID [name], MD   5 mg at 02/22/18 2158  . cholecalciferol (VITAMIN D) tablet 2,000 Units  2,000 Units Oral BID [name], [name], MD   2,000 Units at 02/22/18 2158  . diphenhydrAMINE (BENADRYL) capsule 25 mg  25 mg Oral Q6H PRN [name], [name], MD       Or  . diphenhydrAMINE (BENADRYL) injection 25 mg  25 mg  Intravenous Q6H PRN [name], [name], MD      . fluticasone [facility]) 50 MCG/ACT nasal spray 2 spray  2 spray Each Nare Daily [name], [name], MD   2 spray at 02/22/18 1203  . gabapentin (NEURONTIN) capsule 300 mg  300 mg Oral BID [name], [name], MD   300 mg at 02/22/18 2158  . hydrALAZINE (APRESOLINE) injection 10 mg  10 mg Intravenous Q2H PRN [name], [name], MD      . HYDROcodone-acetaminophen (NORCO/VICODIN) 5-325 MG per tablet 1-2 tablet  1-2 tablet Oral  Q4H PRN [name], [name], MD      . ketorolac (TORADOL) 30 MG/ML injection 30 mg  30 mg Intravenous Q6H PRN [name], [name], MD   30 mg at 02/23/18 0852  . lactated ringers infusion   Intravenous Continuous Catalina Gravel, MD 50 mL/hr at 02/18/18 [phone]    . methocarbamol (ROBAXIN) tablet 500 mg  500 mg Oral Q6H PRN [name], [name], MD   500 mg at 02/23/18 0852  . metoprolol succinate (TOPROL-XL) 24 hr tablet 25 mg  25 mg Oral Daily [name], MD   25 mg at 02/22/18 1203  . mometasone-formoterol (DULERA) 200-5 MCG/ACT inhaler 2 puff  2 puff Inhalation BID [name], [name], MD   2 puff at 02/23/18 [phone]  . montelukast (SINGULAIR) tablet 10 mg  10 mg Oral QHS [name], [name], MD   10 mg at 02/22/18 2158  . morphine 4 MG/ML injection 2 mg  2 mg Intravenous Q1H PRN [name], [name], MD      . ondansetron (ZOFRAN-ODT) disintegrating tablet 4 mg  4 mg Oral Q6H PRN [name], [name], MD       Or  . ondansetron [facility]) injection 4 mg  4 mg Intravenous Q6H PRN [name], [name], MD      . polyethylene glycol (MIRALAX / GLYCOLAX) packet 17 g  17 g Oral Q6H PRN [name], [name], MD   17 g at 02/23/18 7858  . simethicone (MYLICON) chewable tablet 40 mg  40 mg Oral Q6H PRN [name], [name], MD      . vitamin C (ASCORBIC ACID) tablet 1,000 mg  1,000 mg Oral Daily [name], [name], MD   1,000 mg at 02/22/18 1202     Discharge Medications: Please see discharge summary for a list of discharge medications.  Relevant Imaging Results:  Relevant Lab Results:   Additional Information SS# Kristopher Gay, [place]

## 2018-02-23 NOTE — Social Work (Signed)
CSW aware that pt has elected to discharge home and has subsequent discharge order from attending MD.   [place] signing off. Please consult if any additional needs arise.  Alexander Mt, [place] Work [phone]

## 2018-02-23 NOTE — Social Work (Signed)
CSW acknowledging that pt does not feel safe discharging home, CSW aware that pt will need authorization for SNF, if we are unable to facilitate this process today pt will have to discharge home or appeal discharge since he was formally discharged on 4/29.  CSW continuing to follow.   Alexander Mt, [place] Work [phone]

## 2018-02-23 NOTE — Clinical Social Work Note (Signed)
Clinical Social Work Assessment  Patient Details  Name: Kristopher Gay MRN: [id number] Date of Birth: 02/08/1951  Date of referral:  02/16/18               Reason for consult:  Facility Placement, Discharge Planning                Permission sought to share information with:  Facility Art therapist granted to share information::  Yes, Verbal Permission Granted  Name::        Agency::  SNFs  Relationship::     Contact Information:     Housing/Transportation Living arrangements for the past 2 months:  Single Family Home Source of Information:  Patient Patient Interpreter Needed:  None Criminal Activity/Legal Involvement Pertinent to Current Situation/Hospitalization:  No - Comment as needed Significant Relationships:  None(pt states he has no real support) Lives with:    Do you feel safe going back to the place where you live?  No Need for family participation in patient care:  No (Coment) Care giving concerns:  Pt does not feel safe discharging home, worried about needing oxygen.    Social Worker assessment / plan:  CSW met with pt at bedside, pt alert and oriented, pt fixated on O2 machines. Pt states he lives at home by himself, has family but states "they wouldn't help even if they were next door." Pt states that he feels like maybe home is best for him but states worry about what would happen if he went home and it wasn't. CSW spoke with pt about SNF process, and that he was doing well with therapies here so could be very short term. Pt states continued worry and that he understands that hes discharged but doesn't want to make decision.    CSW to f/u with pt.   Employment status:  Retired Nurse, adult PT Recommendations:  [place] / Referral to community resources:  [place]  Patient/Family's Response to care:  Pt confused by process and worried about O2 (which per RN and MD care team pt  has not previously needed nor does he at current levels need O2 at home).   Patient/Family's Understanding of and Emotional Response to Diagnosis, Current Treatment, and Prognosis: Pt states understanding of diagnosis, current treatment and prognosis. Pt states worry about return home.  Emotional Assessment Appearance:  Appears stated age Attitude/Demeanor/Rapport:  Apprehensive, Paranoid Affect (typically observed):  Apprehensive, Anxious Orientation:  Oriented to Self, Oriented to Place, Oriented to  Time, Oriented to Situation Alcohol / Substance use:  Not Applicable Psych involvement (Current and /or in the community):  No (Comment)  Discharge Needs  Concerns to be addressed:  Care Coordination, Discharge Planning Concerns Readmission within the last 30 days:  No Current discharge risk:  Lives alone, Dependent with Mobility, Lack of support system Barriers to Discharge:  Ship broker, Continued Medical Work up   [employer], [place] 02/23/2018, 11:11 AM

## 2018-02-24 ENCOUNTER — Encounter: Payer: Self-pay | Admitting: *Deleted

## 2018-02-24 NOTE — Progress Notes (Signed)
Maintenance vial made. Exp: 02-25-19. hv

## 2018-02-26 DIAGNOSIS — J3089 Other allergic rhinitis: Secondary | ICD-10-CM | POA: Diagnosis not present

## 2018-03-01 ENCOUNTER — Encounter: Payer: Self-pay | Admitting: Neurology

## 2018-03-03 ENCOUNTER — Emergency Department (HOSPITAL_COMMUNITY): Payer: Medicare Other

## 2018-03-03 ENCOUNTER — Other Ambulatory Visit: Payer: Self-pay

## 2018-03-03 ENCOUNTER — Encounter (HOSPITAL_COMMUNITY): Payer: Self-pay | Admitting: Emergency Medicine

## 2018-03-03 ENCOUNTER — Emergency Department (HOSPITAL_COMMUNITY)
Admission: EM | Admit: 2018-03-03 | Discharge: 2018-03-04 | Disposition: A | Discharge status: Home / Self Care | Payer: Medicare Other | Attending: Emergency Medicine | Admitting: Emergency Medicine

## 2018-03-03 DIAGNOSIS — J449 Chronic obstructive pulmonary disease, unspecified: Secondary | ICD-10-CM | POA: Insufficient documentation

## 2018-03-03 DIAGNOSIS — R29898 Other symptoms and signs involving the musculoskeletal system: Secondary | ICD-10-CM | POA: Insufficient documentation

## 2018-03-03 DIAGNOSIS — I4891 Unspecified atrial fibrillation: Secondary | ICD-10-CM | POA: Diagnosis not present

## 2018-03-03 DIAGNOSIS — M5412 Radiculopathy, cervical region: Secondary | ICD-10-CM | POA: Diagnosis not present

## 2018-03-03 DIAGNOSIS — Z7901 Long term (current) use of anticoagulants: Secondary | ICD-10-CM | POA: Diagnosis not present

## 2018-03-03 DIAGNOSIS — Z79899 Other long term (current) drug therapy: Secondary | ICD-10-CM | POA: Diagnosis not present

## 2018-03-03 DIAGNOSIS — M79641 Pain in right hand: Secondary | ICD-10-CM | POA: Diagnosis present

## 2018-03-03 LAB — BASIC METABOLIC PANEL
Anion gap: 9 (ref 5–15)
BUN: 23 mg/dL — ABNORMAL HIGH (ref 6–20)
CO2: 21 mmol/L — ABNORMAL LOW (ref 22–32)
Calcium: 9.3 mg/dL (ref 8.9–10.3)
Chloride: 110 mmol/L (ref 101–111)
Creatinine, Ser: 1.06 mg/dL (ref 0.61–1.24)
GFR calc Af Amer: >60 mL/min (ref >60)
GFR calc non Af Amer: >60 mL/min (ref >60)
Glucose, Bld: 121 mg/dL — ABNORMAL HIGH (ref 65–99)
Potassium: 4.2 mmol/L (ref 3.5–5.1)
Sodium: 140 mmol/L (ref 135–145)

## 2018-03-03 LAB — URINALYSIS, ROUTINE W REFLEX MICROSCOPIC
Bilirubin Urine: NEGATIVE
Glucose, UA: NEGATIVE mg/dL
Hgb urine dipstick: NEGATIVE
Ketones, ur: NEGATIVE mg/dL
Leukocytes, UA: NEGATIVE
Nitrite: NEGATIVE
Protein, ur: NEGATIVE mg/dL
Specific Gravity, Urine: 1.018 (ref 1.005–1.030)
pH: 5 (ref 5.0–8.0)

## 2018-03-03 LAB — CBC
HCT: 43.9 % (ref 39.0–52.0)
Hemoglobin: 15.4 g/dL (ref 13.0–17.0)
MCH: 31.8 pg (ref 26.0–34.0)
MCHC: 35.1 g/dL (ref 30.0–36.0)
MCV: 90.7 fL (ref 78.0–100.0)
Platelets: 209 10*3/uL (ref 150–400)
RBC: 4.84 MIL/uL (ref 4.22–5.81)
RDW: 12.9 % (ref 11.5–15.5)
WBC: 7.6 10*3/uL (ref 4.0–10.5)

## 2018-03-03 MED ORDER — PREDNISONE 20 MG PO TABS: ORAL_TABLET | ORAL | 0 refills | Status: DC

## 2018-03-03 MED ORDER — PREDNISONE 20 MG PO TABS
60.0000 mg | ORAL_TABLET | Freq: Once | ORAL | Status: AC
  Administered 2018-03-04: 60 mg via ORAL
  Filled 2018-03-03: qty 3

## 2018-03-03 MED ORDER — LORAZEPAM 2 MG/ML IJ SOLN
1.0000 mg | Freq: Once | INTRAMUSCULAR | Status: AC
Start: 2018-03-03 — End: 2018-03-03
  Administered 2018-03-03: 1 mg via INTRAVENOUS
  Filled 2018-03-03: qty 1

## 2018-03-03 NOTE — ED Notes (Signed)
ED Provider at bedside. 

## 2018-03-03 NOTE — Discharge Instructions (Signed)
You have pinched nerve in your neck.   Take prednisone as prescribed.   Take tylenol, motrin for pain.   Call Dr. Wynonia Sours (spine doctor) for follow up   Return to ER if you have worse weakness, numbness, neck pain.

## 2018-03-03 NOTE — ED Notes (Signed)
Pt is demanding to see a dr for his arm pain , sts he will see his neurologist for his afib and fainting epsode 10 days ago .  Pts EKG was done but he is upset that we are focused on his neuro and heart rather than his arm at this point.  Explained to patient protocol orders and that the physician can order xrays for his arm once the provider sees him. Pt verbalized understanding

## 2018-03-03 NOTE — ED Triage Notes (Signed)
Pt verbalizes worsening right hand and arm pain post fall on 4/26. Fall as result of dizziness/new onset afib per pt. Pt verbalizes SOB with walking long distances; "I lost my breath in the parking lot."

## 2018-03-03 NOTE — ED Notes (Signed)
Patient transported to CT 

## 2018-03-03 NOTE — ED Provider Notes (Signed)
[facility] DEPT Provider Note   CSN: [id number] Arrival date & time: 03/03/18  1544     History   Chief Complaint Chief Complaint  Patient presents with  . Hand Pain  . Arm Pain    HPI Kristopher Gay is a 68 y.o. male history of COPD, hypertension, here presenting with right arm numbness and weakness, right forearm pain.  She states that he had a revision of umbilical hernia surgery done on 4/26.  While in the hospital, he had a syncopal episode and fell and landed on the right forearm.  At that time he was diagnosed with new onset atrial fibrillation and was started on Eliquis.  Discharge from the hospital about a week ago.  He states that since last week, he has been having persistent weakness and numbness in the right fourth and fifth fingers.  Also has been dropping things with his right hand.  Denies any trouble speaking or slurring of his speech or trouble walking.  Patient had a referral to neurology but the appointment is not for another month.  Patient was told to come to the ED for further evaluation.  Patient states that his abdominal wound is recovering well and denies any abdominal pain.  The history is provided by the patient.    Past Medical History:  Diagnosis Date  . Allergic rhinitis, cause unspecified   . Allergy   . Anxiety   . Blood clot in vein 2002   left calf SUPERFICIAL SLOT REMOVED THEN PART OF VEIN REMOVED  . Chronic headaches   . COPD (chronic obstructive pulmonary disease) ([place])   . Cough    X 1 YEAR NO FEVER CLEAR SPUTUM  . DDD (degenerative disc disease)   . Depression   . DJD (degenerative joint disease)    ALL THE WAY DOWN SPINE  . Dropfoot    LEFT , NO BRACE WORN NOW  . Emphysema of lung ([place])   . Extrinsic asthma, unspecified   . GERD (gastroesophageal reflux disease)    OCC NO MEDS FOR  . Hypertension    STOPPED HTN MEDS UNTIL 2011, THEN HAD WEIGHT LOSS, NO MEDS SINCE  . Neuromuscular disorder ([place])   .  Neuropathy    POLYNEUOPATHOPATHY FEET AND LEGS  . Peripheral vascular disease (HCC)    VARICOSE VEINS LEFT LEG  . Pneumonia AS CHILD  . Umbilical hernia   . Unspecified asthma(493.90)     Patient Active Problem List   Diagnosis Date Noted  . New onset a-fib ([place]) 02/20/2018  . Umbilical hernia 34/74/2595  . Hernia of abdominal wall 07/20/2017  . Chronic right shoulder pain 05/15/2016  . Right knee pain 05/15/2016  . Adrenal adenoma 02/20/2016  . Chronic pain syndrome 10/12/2015  . Odynophagia 05/22/2015  . Dysphagia, pharyngoesophageal phase 09/14/2014  . Colon cancer screening 09/14/2014  . Other chronic postoperative pain 01/16/2014  . Allergy, insect bite 07/02/2012  . GERD (gastroesophageal reflux disease) 03/25/2012  . 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 Lumbosacral spondylosis 01/13/2012  . Chronic back pain 11/17/2011  . Seasonal and perennial allergic rhinitis 12/26/2010  . MUSCULOSKELETAL PAIN 04/16/2009  . Asthmatic bronchitis , chronic ([place]) 01/30/2008    Past Surgical History:  Procedure Laterality Date  . COLONSCOPY  06/16/2017  . HERNIA REPAIR    . INGUINAL HERNIA REPAIR Bilateral 07/20/2017   Procedure: LAPAROSCOPIC BILATERAL INGUINAL HERNIA REPAIR WITH MESH, UMBILICAL HERNIA REPAIR AND LYSIS OF ADHESIONS;  Surgeon: [name], [name], MD;  Location: [facility];  Service: General;  Laterality: Bilateral;  . LAPAROSCOPY N/A 07/20/2017   Procedure: LAPAROSCOPY DIAGNOSTIC;  Surgeon: Kieth Brightly [name], MD;  Location: [facility];  Service: General;  Laterality: N/A;  . ROTATOR CUFF REPAIR Left 2005   detached; left shoulder-reattached  . SHOULDER SURGERY Right    laBRAL  tendon torn  . SUPERFICIAL LEFT LEG VEIN STRIPPING WITH SMALL SUPERFICIAL CLOT  2002  . UMBILICAL HERNIA REPAIR  02/18/2018  . UMBILICAL HERNIA REPAIR N/A 02/18/2018   Procedure: LAPAROSCOPIC UMBILICAL HERNIA REPAIR ERAS PATHWAY;  Surgeon: [name], [name], MD;  Location: [facility];  Service:  General;  Laterality: N/A;        Home Medications    Prior to Admission medications   Medication Sig Start Date End Date Taking? Authorizing Provider  apixaban (ELIQUIS) 5 MG TABS tablet Take 1 tablet (5 mg total) by mouth 2 (two) times daily. 02/22/18  Yes [name], [name], MD  ascorbic acid (VITAMIN C) 1000 MG tablet Take 1,000 mg by mouth daily.    Yes [provider]  Cholecalciferol (VITAMIN D3) 2000 units TABS Take 1 tablet by mouth 2 (two) times daily.    Yes [provider]  EPINEPHrine (EPIPEN 2-PAK) 0.3 mg/0.3 mL SOAJ injection Inject 0.3 mg into the muscle once. Reported on 12/13/2015   Yes [provider]  fluticasone (FLONASE) 50 MCG/ACT nasal spray Place 2 sprays into both nostrils daily. 09/21/17  Yes Young, Tarri Fuller D, MD  Fluticasone-Salmeterol (ADVAIR DISKUS) 500-50 MCG/DOSE AEPB INHALE 1 PUFF BY MOUTH TWICE DAILY, THEN RINSE MOUTH 11/17/17  Yes Young, [name], MD  ibuprofen (ADVIL,MOTRIN) 800 MG tablet Take 1 tablet (800 mg total) by mouth every 8 (eight) hours as needed. Patient taking differently: Take 800 mg by mouth every 8 (eight) hours as needed for moderate pain.  02/22/18  Yes [name], [name], MD  methocarbamol (ROBAXIN) 500 MG tablet Take 1 tablet (500 mg total) by mouth every 6 (six) hours as needed for muscle spasms. Patient taking differently: Take 500 mg by mouth every 8 (eight) hours as needed for muscle spasms.  02/19/18  Yes [name], [name], MD  metoprolol succinate (TOPROL-XL) 25 MG 24 hr tablet Take 1 tablet (25 mg total) by mouth daily. 02/23/18  Yes [name], [name], MD  montelukast (SINGULAIR) 10 MG tablet Take 1 tablet (10 mg total) by mouth at bedtime. 11/17/17  Yes Padgett, Rae Halsted, MD  polyethylene glycol [facility] / Floria Raveling) packet Take 17 g by mouth daily as needed for mild constipation.   Yes [provider]  Calcium-Magnesium-Vitamin D (CALCIUM 1200+D3 PO) Take 1 tablet by mouth 2  (two) times daily.    [provider]  celecoxib (CELEBREX) 200 MG capsule TAKE 1 CAPSULE(200 MG) BY MOUTH DAILY 08/12/17   Kirsteins, Luanna Salk, MD  DHEA 25 MG CAPS Take 1 capsule by mouth daily. Reported on 12/19/2015    [provider]  glucosamine-chondroitin 500-400 MG tablet Take 1 tablet by mouth daily.    [provider]  Multiple Vitamin (MULTIVITAMIN) tablet Take 1 tablet by mouth 2 (two) times daily. Reported on 12/19/2015    [provider]  Omega-3 Fatty Acids (FISH OIL) 1000 MG CAPS Take 1 capsule by mouth 2 (two) times daily. Reported on 12/19/2015    [provider]  Tiotropium Bromide-Olodaterol (STIOLTO RESPIMAT) 2.5-2.5 MCG/ACT AERS Inhale 2 puffs into the lungs daily. Patient not taking: Reported on 02/12/2018 09/21/17   [name],  MD  vitamin B-12 (CYANOCOBALAMIN) 1000 MCG tablet Take 1,000 mcg by mouth daily.    [provider]  vitamin E (VITAMIN E) 400 UNIT capsule Take 400 Units by mouth daily. Reported on 12/19/2015    [provider]    Family History Family History  Problem Relation Age of Onset  . Pancreatic cancer Mother   . Breast cancer Sister   . Epilepsy Brother   . Adrenal disorder Neg Hx     Social History Social History   Tobacco Use  . Smoking status: Never Smoker  . Smokeless tobacco: Never Used  Substance Use Topics  . Alcohol use: No    Alcohol/week: 0.0 oz  . Drug use: No     Allergies   Bee venom; Linzess [linaclotide]; Molds & smuts; and Seasonal ic [cholestatin]   Review of Systems Review of Systems  Neurological: Positive for weakness and numbness.  All other systems reviewed and are negative.    Physical Exam Updated Vital Signs BP 137/84 (BP Location: Left Arm)   Pulse (!) 57   Resp 18   SpO2 97%   Physical Exam  Constitutional: [name], place, and time.  Chronically ill   HENT:  Head: Normocephalic.  Eyes: Pupils are equal, round,  and reactive to light. Conjunctivae and EOM are normal.  Neck: Normal range of motion. Neck supple.  Cardiovascular: Normal rate, regular rhythm and normal heart sounds.  Pulmonary/Chest: Effort normal and breath sounds normal. No stridor. No respiratory distress. He has no wheezes.  Abdominal:  Abdominal binder in place   Musculoskeletal: Normal range of motion.  Neurological: He is alert and oriented to person, place, and time.  CN 2- 12 intact. Strength 4/5 R elbow flexion and extension, 4/5 R wrist flexion and extension, 4/5 finger apposition, sensation intact. Strength 5/5 other extremities  Skin: Skin is warm.  Psychiatric: [name]  Nursing note and vitals reviewed.    ED Treatments / Results  Labs (all labs ordered are listed, but only abnormal results are displayed) Labs Reviewed  BASIC METABOLIC PANEL - Abnormal; Notable for the following components:      Result Value   CO2 21 (*)    Glucose, Bld 121 (*)    BUN 23 (*)    All other components within normal limits  CBC  URINALYSIS, ROUTINE W REFLEX MICROSCOPIC  CBG MONITORING, ED    EKG None  Radiology Dg Chest 2 View  Result Date: 03/03/2018 CLINICAL DATA:  67 y/o M; right hand and arm pain post fall on 04/26. Dizziness with new onset atrial fibrillation. Shortness of breath while walking long distances. EXAM: CHEST - 2 VIEW COMPARISON:  02/19/2018 chest radiograph. FINDINGS: Normal cardiac silhouette. Aortic atherosclerosis with calcification. Linear opacities in the lung bases. No focal consolidation. No pleural effusion or pneumothorax. No acute osseous abnormality is evident. IMPRESSION: Linear opacities in the lung bases compatible with minor atelectasis. Aortic atherosclerosis. Electronically Signed   By: Kristine Garbe M.D.   On: 03/03/2018 17:30   Dg Forearm Right  Result Date: 03/03/2018 CLINICAL DATA:  Patient fell after having surgery in early April. Patient landed on right  arm and is having right ulnar-sided forearm and wrist pain. EXAM: RIGHT FOREARM - 2 VIEW COMPARISON:  None. FINDINGS: Osteoarthritis of the elbow joint with remote presumed posttraumatic deformity noted of the distal humerus, proximal ulna and radius. Soft tissue ossification along the course of the triceps tendon likely  related to old remote trauma as well. No acute appearing fracture or joint dislocation. Subcortical cystic change of the styloid of the ulna, likely degenerative in etiology. IMPRESSION: Osteoarthritis of the elbow joint.  No acute osseous abnormality. Electronically Signed   By: Ashley Royalty M.D.   On: 03/03/2018 20:04   Mr Brain Wo Contrast  Result Date: 03/03/2018 CLINICAL DATA:  67 y/o  M; right hand numbness. EXAM: MRI HEAD WITHOUT CONTRAST TECHNIQUE: Multiplanar, multiecho pulse sequences of the brain and surrounding structures were obtained without intravenous contrast. COMPARISON:  08/17/2008 CT head FINDINGS: Brain: No acute infarction, hemorrhage, hydrocephalus, extra-axial collection or mass lesion. Few nonspecific foci of T2 FLAIR hyperintense signal abnormality in subcortical and periventricular white matter are compatible with mild chronic microvascular ischemic changes for age. Mild brain parenchymal volume loss. Vascular: Normal flow voids. Skull and upper cervical spine: Normal marrow signal. Sinuses/Orbits: Patchy opacification of the ethmoid air cells and small right maxillary sinus fluid level. No abnormal signal of mastoid air cells. Orbits are unremarkable. Other: None. IMPRESSION: 1. No acute intracranial abnormality identified. 2. Mild for age chronic microvascular ischemic changes and parenchymal volume loss of the brain. 3. Patchy opacification of ethmoid air cells and right maxillary sinus with a fluid level which may represent acute sinusitis in the appropriate clinical setting. Electronically Signed   By: Kristine Garbe M.D.   On: 03/03/2018 22:57   Mr  Cervical Spine Wo Contrast  Result Date: 03/03/2018 CLINICAL DATA:  Recent fall 02/19/2018.  Neck and RIGHT arm pain. EXAM: MRI CERVICAL SPINE WITHOUT CONTRAST TECHNIQUE: Multiplanar, multisequence MR imaging of the cervical spine was performed. No intravenous contrast was administered. COMPARISON:  None. FINDINGS: Alignment: Straightening of the normal cervical lordosis. No traumatic subluxation. Vertebrae: No fracture, evidence of diskitis, or bone lesion. Endplate reactive changes related to disc space narrowing are seen at C3-4, C4-5, and C7-T1. Cord: Cord flattening without abnormal cord signal, described below. No intraspinal hematoma. Posterior Fossa, vertebral arteries, paraspinal tissues: No tonsillar herniation. Vertebral flow voids are maintained. No neck masses. No visible ligamentous injury. Disc levels: C2-3: Disc space is normal. There is advanced facet arthropathy on the LEFT. Mild uncinate spurring. LEFT C3 foraminal narrowing. C3-4: Advanced disc space narrowing. Annular bulge with osseous spurring. LEFT greater than RIGHT facet arthropathy. Mild LEFT-sided cord flattening but canal adequate at 7 mm. LEFT greater than RIGHT C4 foraminal narrowing. C4-5: Disc space narrowing. Annular bulge with osseous spurring. Facet arthropathy with uncinate spurring. BILATERAL C5 foraminal narrowing. Mild cord flattening, with canal diameter 7 mm, adequate. C5-6: Disc desiccation, mild loss of disc height. No disc protrusion. No impingement. C6-7: Disc space narrowing. Annular bulge with osseous spurring. Central and rightward protrusion. RIGHT-sided cord flattening, with canal diameter 7 mm, adequate. RIGHT C7 foraminal narrowing. C7-T1: Disc space narrowing. Central protrusion with effacement anterior subarachnoid space. No cord flattening. BILATERAL facet arthropathy. No definite C8 foraminal narrowing. IMPRESSION: Multilevel spondylosis without evidence for cervical spine fracture or traumatic subluxation.  The dominant RIGHT-sided abnormality is at C6-7 where central and rightward protrusion with osseous spurring contributes to RIGHT C7 foraminal narrowing. Mild multifactorial stenosis at C3-4, C4-5, and C6-7, with slight cord flattening but no significant cord compression or abnormal cord signal. Significant LEFT-sided facet arthropathy at C2-3 and C3-4. No intraspinal hematoma or visible ligamentous injury. Electronically Signed   By: Staci Righter M.D.   On: 03/03/2018 23:24    Procedures Procedures (including critical care time)  Medications Ordered in ED Medications  predniSONE (DELTASONE) tablet 60 mg (has no administration in time range)  LORazepam (ATIVAN) injection 1 mg (1 mg Intravenous Given 03/03/18 2142)     Initial Impression / Assessment and Plan / ED Course  I have reviewed the triage vital signs and the nursing notes.  Pertinent labs & imaging results that were available during my care of the patient were reviewed by me and considered in my medical decision making (see chart for details).    ARTIN MCEUEN is a 67 y.o. male here with R arm weakness, fall. Consider stroke vs cervical radiculopathy. He was recently diagnosed with afib and is on eliquis. Will get labs, MRI brain and MRI cervical spine.   11:56 PM  MRI brain unremarkable. MRI cervical spine showed Right C7 foraminal narrowing. I think he is likely has cervical radiculopathy. Will dc home with steroid taper, neurosurgery follow up.   Final Clinical Impressions(s) / ED Diagnoses   Final diagnoses:  None    ED Discharge Orders    None       Drenda Freeze, MD 03/03/18 2357

## 2018-03-14 ENCOUNTER — Other Ambulatory Visit: Payer: Self-pay | Admitting: Physical Medicine & Rehabilitation

## 2018-03-19 ENCOUNTER — Encounter: Payer: Medicare Other | Attending: Physical Medicine & Rehabilitation

## 2018-03-19 ENCOUNTER — Encounter: Payer: Self-pay | Admitting: Physical Medicine & Rehabilitation

## 2018-03-19 ENCOUNTER — Ambulatory Visit: Payer: Medicare Other | Admitting: Physical Medicine & Rehabilitation

## 2018-03-19 VITALS — BP 125/69 | HR 68 | Ht 74.0 in | Wt 256.0 lb

## 2018-03-19 DIAGNOSIS — Z7901 Long term (current) use of anticoagulants: Secondary | ICD-10-CM | POA: Diagnosis not present

## 2018-03-19 DIAGNOSIS — Z800 Family history of malignant neoplasm of digestive organs: Secondary | ICD-10-CM | POA: Insufficient documentation

## 2018-03-19 DIAGNOSIS — M47816 Spondylosis without myelopathy or radiculopathy, lumbar region: Secondary | ICD-10-CM | POA: Diagnosis not present

## 2018-03-19 DIAGNOSIS — Z791 Long term (current) use of non-steroidal anti-inflammatories (NSAID): Secondary | ICD-10-CM | POA: Diagnosis not present

## 2018-03-19 DIAGNOSIS — J439 Emphysema, unspecified: Secondary | ICD-10-CM | POA: Insufficient documentation

## 2018-03-19 DIAGNOSIS — Z803 Family history of malignant neoplasm of breast: Secondary | ICD-10-CM | POA: Diagnosis not present

## 2018-03-19 DIAGNOSIS — G5621 Lesion of ulnar nerve, right upper limb: Secondary | ICD-10-CM | POA: Diagnosis not present

## 2018-03-19 DIAGNOSIS — M47817 Spondylosis without myelopathy or radiculopathy, lumbosacral region: Secondary | ICD-10-CM

## 2018-03-19 DIAGNOSIS — G8929 Other chronic pain: Secondary | ICD-10-CM | POA: Insufficient documentation

## 2018-03-19 DIAGNOSIS — Z86718 Personal history of other venous thrombosis and embolism: Secondary | ICD-10-CM | POA: Diagnosis not present

## 2018-03-19 MED ORDER — GABAPENTIN 300 MG PO CAPS: 300.0000 mg | ORAL_CAPSULE | Freq: Three times a day (TID) | ORAL | 6 refills | Status: DC

## 2018-03-19 MED ORDER — CELECOXIB 200 MG PO CAPS: ORAL_CAPSULE | ORAL | 1 refills | Status: DC
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
## 2018-03-19 NOTE — Patient Instructions (Signed)
Discuss celebrex use with Cardiology

## 2018-03-23 ENCOUNTER — Telehealth: Payer: Self-pay | Admitting: *Deleted

## 2018-03-23 NOTE — Telephone Encounter (Signed)
It is okay to order gabapentin 300 mg 2 tablets in the morning 1 at noon and 2 in the evening with 5 refills

## 2018-03-23 NOTE — Telephone Encounter (Signed)
Mr Kristopher Gay called with questions about his Celebrex refills--I see Dr Letta Pate note about needing to discuss with cardiologist -- and his gabapentin dose.  He thinks it is wrong.  By his history it looks like he was getting 300mg  (2 q am, one q afternoon, and 2 at bedtime) but that was last written a year ago.  Please advise.

## 2018-03-24 ENCOUNTER — Encounter: Payer: Self-pay | Admitting: Physician Assistant

## 2018-03-24 ENCOUNTER — Ambulatory Visit: Payer: Medicare Other | Admitting: Physician Assistant

## 2018-03-24 VITALS — BP 130/84 | HR 65 | Ht 74.0 in | Wt 260.0 lb

## 2018-03-24 DIAGNOSIS — Z13220 Encounter for screening for lipoid disorders: Secondary | ICD-10-CM | POA: Diagnosis not present

## 2018-03-24 DIAGNOSIS — I10 Essential (primary) hypertension: Secondary | ICD-10-CM

## 2018-03-24 DIAGNOSIS — I480 Paroxysmal atrial fibrillation: Secondary | ICD-10-CM

## 2018-03-24 LAB — LIPID PANEL
Chol/HDL Ratio: 4.8 ratio (ref 0.0–5.0)
Cholesterol, Total: 171 mg/dL (ref 100–199)
HDL: 36 mg/dL — ABNORMAL LOW (ref >39)
LDL Calculated: 120 mg/dL — ABNORMAL HIGH (ref 0–99)
Triglycerides: 74 mg/dL (ref 0–149)
VLDL Cholesterol Cal: 15 mg/dL (ref 5–40)

## 2018-03-24 MED ORDER — GABAPENTIN 300 MG PO CAPS: ORAL_CAPSULE | ORAL | 5 refills | Status: DC

## 2018-03-24 NOTE — Telephone Encounter (Addendum)
New order sent for gabapentin 300 mg 2 q am and one midday and 2 at hs #150 with 5 refills. Notified Kristopher Gay of gabapentin change and that he needs to speak with cardiologist about remaining on celebrex. Also let him know Dr Georgie Chard office has a cancellation list he can be put on if he lets them know.

## 2018-03-24 NOTE — Patient Instructions (Signed)
Medication Instructions:  STOP fish oil, DHEA, and vitamin E  Follow-Up: 3 months with Dr. Debara Pickett       If you need a refill on your cardiac medications before your next appointment, please call your pharmacy.

## 2018-03-24 NOTE — Progress Notes (Signed)
Cardiology Office Note   Date:  03/24/2018   ID:  Kristopher Gay, DOB 25-Nov-1950, MRN [id number]  PCP:  [name], MD  Cardiologist: Dr. Debara Pickett, 02/21/2018 in hospital Kristopher Ferries, PA-C   Chief Complaint  Patient presents with  . Hospitalization Follow-up    History of Present Illness: Kristopher Gay is a 67 y.o. male with a history of COPD, HTN, PVD, polyneuropathy  Admitted 4/43-1/54/0086 for umbilical hernia repair. ?Syncope with fall, then noted to have a rapid and irregular heart rate. Cardiology saw for new diagnosis of atrial fibrillation, spontaneous conversion to sinus rhythm. Started on Eliquis plus metoprolol 5/8 ER visit for hand pain and arm pain, diagnosed with cervical radiculopathy and started on steroids, follow-up with neurosurgery  Kristopher Gay presents for cardiology follow up.  He is some better after the hernia repair and is improving, but still having problems.    His R arm and right hand are giving him problems after the fall.  He has an appointment to see a neurologist.  He did not wish to see a neurosurgeon.  He is reluctant to take the metoprolol and the Eliquis. He wonders if his HR and BP would be controlled without it.    He wonders how long he needs to be on the Eliquis.  He is not having any bleeding issues.  He has been having brief episodes of being light-headed.  They are not clearly orthostatic in nature.  However, he admits that he spends most of his time sitting or lying down.  He is starting to get up and around a little bit.  He walks with a walker.  He can go to the grocery store which is a mile away.  He does not do much other than go to the grocery store and go to the doctor.  He struggled getting here because he parked at the other end of the parking lot and had to take 2 different elevators and walk a longer distance.  That was hard for him.  He has not had any palpitations.  He does not think he has had any more atrial  fibrillation.   Past Medical History:  Diagnosis Date  . Allergic rhinitis, cause unspecified   . Allergy   . Anxiety   . Blood clot in vein 2002   left calf SUPERFICIAL CLOT REMOVED THEN PART OF VEIN REMOVED  . Chronic headaches   . COPD (chronic obstructive pulmonary disease) ([place])   . Cough    X 1 YEAR NO FEVER CLEAR SPUTUM  . DDD (degenerative disc disease)   . Depression   . DJD (degenerative joint disease)    ALL THE WAY DOWN SPINE  . Dropfoot    LEFT , NO BRACE WORN NOW  . Emphysema of lung ([place])   . Extrinsic asthma, unspecified   . GERD (gastroesophageal reflux disease)    OCC NO MEDS FOR  . Hypertension    STOPPED HTN MEDS UNTIL 2011, THEN HAD WEIGHT LOSS, NO MEDS SINCE  . Neuromuscular disorder ([place])   . Neuropathy    POLYNEUOPATHOPATHY FEET AND LEGS  . Peripheral vascular disease ([facility])    VARICOSE VEINS LEFT LEG  . Pneumonia AS CHILD  . Umbilical hernia   . Unspecified asthma(493.90)     Past Surgical History:  Procedure Laterality Date  . COLONSCOPY  06/16/2017  . HERNIA REPAIR    . INGUINAL HERNIA REPAIR Bilateral 07/20/2017   Procedure: LAPAROSCOPIC BILATERAL INGUINAL HERNIA  REPAIR WITH MESH, UMBILICAL HERNIA REPAIR AND LYSIS OF ADHESIONS;  Surgeon: [name], [name], MD;  Location: [facility];  Service: General;  Laterality: Bilateral;  . LAPAROSCOPY N/A 07/20/2017   Procedure: LAPAROSCOPY DIAGNOSTIC;  Surgeon: Kieth Brightly [name], MD;  Location: [facility];  Service: General;  Laterality: N/A;  . ROTATOR CUFF REPAIR Left 2005   detached; left shoulder-reattached  . SHOULDER SURGERY Right    laBRAL  tendon torn  . SUPERFICIAL LEFT LEG VEIN STRIPPING WITH SMALL SUPERFICIAL CLOT  2002  . UMBILICAL HERNIA REPAIR  02/18/2018  . UMBILICAL HERNIA REPAIR N/A 02/18/2018   Procedure: LAPAROSCOPIC UMBILICAL HERNIA REPAIR ERAS PATHWAY;  Surgeon: [name], [name], MD;  Location: [facility];  Service: General;  Laterality: N/A;    Current Outpatient Medications    Medication Sig Dispense Refill  . apixaban (ELIQUIS) 5 MG TABS tablet Take 1 tablet (5 mg total) by mouth 2 (two) times daily. 60 tablet 0  . ascorbic acid (VITAMIN C) 1000 MG tablet Take 1,000 mg by mouth daily.     . Calcium-Magnesium-Vitamin D (CALCIUM 1200+D3 PO) Take 1 tablet by mouth 2 (two) times daily.    . celecoxib (CELEBREX) 200 MG capsule TAKE 1 CAPSULE(200 MG) BY MOUTH DAILY 30 capsule 1  . Cholecalciferol (VITAMIN D3) 2000 units TABS Take 1 tablet by mouth 2 (two) times daily.     Marland Kitchen DHEA 25 MG CAPS Take 1 capsule by mouth daily. Reported on 12/19/2015    . EPINEPHrine (EPIPEN 2-PAK) 0.3 mg/0.3 mL SOAJ injection Inject 0.3 mg into the muscle once. Reported on 12/13/2015    . fluticasone (FLONASE) 50 MCG/ACT nasal spray Place 2 sprays into both nostrils daily. 16 g 12  . Fluticasone-Salmeterol (ADVAIR DISKUS) 500-50 MCG/DOSE AEPB INHALE 1 PUFF BY MOUTH TWICE DAILY, THEN RINSE MOUTH 1 each 5  . gabapentin (NEURONTIN) 300 MG capsule Take 1 capsule (300 mg total) by mouth 3 (three) times daily. 90 capsule 6  . glucosamine-chondroitin 500-400 MG tablet Take 1 tablet by mouth daily.    . metoprolol succinate (TOPROL-XL) 25 MG 24 hr tablet Take 1 tablet (25 mg total) by mouth daily. 90 tablet 0  . montelukast (SINGULAIR) 10 MG tablet Take 1 tablet (10 mg total) by mouth at bedtime. 30 tablet 5  . Multiple Vitamin (MULTIVITAMIN) tablet Take 1 tablet by mouth 2 (two) times daily. Reported on 12/19/2015    . Omega-3 Fatty Acids (FISH OIL) 1000 MG CAPS Take 1 capsule by mouth 2 (two) times daily. Reported on 12/19/2015    . polyethylene glycol (MIRALAX / GLYCOLAX) packet Take 17 g by mouth daily as needed for mild constipation.    . vitamin B-12 (CYANOCOBALAMIN) 1000 MCG tablet Take 1,000 mcg by mouth daily.    . vitamin E (VITAMIN E) 400 UNIT capsule Take 400 Units by mouth daily. Reported on 12/19/2015     No current facility-administered medications for this visit.     Allergies:   Bee  venom; Linzess [linaclotide]; Molds & smuts; and Seasonal ic [cholestatin]    Social History:  The patient  reports that he has never smoked. He has never used smokeless tobacco. He reports that he does not drink alcohol or use drugs.   Family History:  The patient's family history includes Breast cancer in his sister; Epilepsy in his brother; Pancreatic cancer in his mother.    ROS:  Please see the history of present illness. All other systems are reviewed and negative.  PHYSICAL EXAM: VS:  BP 130/84   Pulse 65   Ht 6\' 2"  (1.88 m)   Wt 260 lb (117.9 kg)   SpO2 95%   BMI 33.38 kg/m  , BMI Body mass index is 33.38 kg/m. GEN: Well nourished, well developed, male in no acute distress  HEENT: normal for age  Neck: no JVD, no carotid bruit, no masses Cardiac: RRR; soft murmur, no rubs, or gallops Respiratory:  clear to auscultation bilaterally, normal work of breathing GI: soft, nontender, nondistended, + BS MS: no deformity or atrophy; no edema; distal pulses are 2+ in all 4 extremities   Skin: warm and dry, no rash Neuro:  Strength and sensation are intact Psych: euthymic mood, full affect   EKG:  EKG is ordered today. The ekg ordered today demonstrates sinus rhythm, heart rate 65, normal intervals and no acute ischemic changes.  Mild early re-pol.   Recent Labs: 07/23/2017: ALT 19 03/03/2018: BUN 23; Creatinine, Ser 1.06; Hemoglobin 15.4; Platelets 209; Potassium 4.2; Sodium 140    Lipid Panel No results found for: CHOL, TRIG, HDL, CHOLHDL, VLDL, LDLCALC, LDLDIRECT   Wt Readings from Last 3 Encounters:  03/24/18 260 lb (117.9 kg)  03/19/18 256 lb (116.1 kg)  02/22/18 250 lb 14.1 oz (113.8 kg)     Other studies Reviewed:  Additional studies/ records that were reviewed today include: Office notes, hospital records and testing.  ASSESSMENT AND PLAN:  1.  PAF: -He is currently maintaining sinus rhythm -This happened in the setting of surgery. -Strongly encouraged  him to keep taking the Eliquis for 3 months, the same as after cardioversion -Strongly encouraged him to keep taking the metoprolol, to help keep him in sinus rhythm -Discusse duration of treatment with Dr. Debara Pickett  2.  Hypertension: -He feels that if he can lose the weight his blood pressure will be fine without the metoprolol -He was previously able to lose weight and come off all blood pressure medications. -I emphasized that the metoprolol was not harming him and may help keep him in sinus rhythm, continue it for now. -He was reassured that he is on the lowest possible dose and he is not having side effects from the medication.  3.  Anticoagulation: CHA2DS2-VASc = (age x 1, HTN) -No bleeding issues, continue Eliquis for now  4.  Lipid screening -He states his PCP has not checked a lipid profile in several years. -He has eaten a small amount this morning, but it is difficult for him to get to and from the office, we will go ahead and check a profile today.  Current medicines are reviewed at length with the patient today.  The patient has concerns regarding medicines.  Concerns were addressed.  The following changes have been made:  no change  Labs/ tests ordered today include:  No orders of the defined types were placed in this encounter.    Disposition:   FU with Dr. Debara Pickett  Signed, Kristopher Ferries, PA-C  03/24/2018 9:49 AM    [place] Phone: [phone]; Fax: [phone]  This note was written with the assistance of speech recognition software. Please excuse any transcriptional errors.

## 2018-03-25 ENCOUNTER — Telehealth: Payer: Self-pay

## 2018-03-25 ENCOUNTER — Other Ambulatory Visit: Payer: Self-pay

## 2018-03-25 ENCOUNTER — Ambulatory Visit: Payer: Medicare Other | Admitting: Internal Medicine

## 2018-03-25 ENCOUNTER — Other Ambulatory Visit: Payer: Self-pay | Admitting: Physician Assistant

## 2018-03-25 ENCOUNTER — Encounter: Payer: Self-pay | Admitting: Internal Medicine

## 2018-03-25 ENCOUNTER — Telehealth: Payer: Self-pay | Admitting: Physician Assistant

## 2018-03-25 DIAGNOSIS — K219 Gastro-esophageal reflux disease without esophagitis: Secondary | ICD-10-CM

## 2018-03-25 DIAGNOSIS — J0100 Acute maxillary sinusitis, unspecified: Secondary | ICD-10-CM | POA: Diagnosis not present

## 2018-03-25 DIAGNOSIS — J449 Chronic obstructive pulmonary disease, unspecified: Secondary | ICD-10-CM | POA: Diagnosis not present

## 2018-03-25 MED ORDER — APIXABAN 5 MG PO TABS: 5.0000 mg | ORAL_TABLET | Freq: Two times a day (BID) | ORAL | 3 refills | Status: DC

## 2018-03-25 MED ORDER — METOPROLOL SUCCINATE ER 25 MG PO TB24: 25.0000 mg | ORAL_TABLET | Freq: Every day | ORAL | 3 refills | Status: DC

## 2018-03-25 MED ORDER — UMECLIDINIUM-VILANTEROL 62.5-25 MCG/INH IN AEPB: 1.0000 | INHALATION_SPRAY | Freq: Every day | RESPIRATORY_TRACT | 0 refills | Status: DC

## 2018-03-25 NOTE — Assessment & Plan Note (Signed)
Emphasis on reflux precautions 

## 2018-03-25 NOTE — Assessment & Plan Note (Signed)
Minimally symptomatic but it might contribute to persistent cough. Plan-Z-Pak

## 2018-03-25 NOTE — Addendum Note (Signed)
Addended by: Jannette Spanner on: 03/25/2018 03:00 PM   Modules accepted: Orders

## 2018-03-25 NOTE — Progress Notes (Signed)
---------------  Patient ID: Kristopher Gay, male    DOB: 09/15/1951, 67 y.o.   MRN: [id number]  HPI  M never smoker followed for allergic rhinitis and allergic asthma/ bronchitis, complicated by chronic musculoskeletal pain after an injury, GERD/ reflux, AFib Barium Swallow 04/04/14.2 Moderate gastroesophageal reflux to the level of mid esophagus Allergy Profile 10/16/16- Neg-no elevation for local environmental allergens CT chest 01/11/2016-IMPRESSION: 1. 2.2 cm left adrenal adenoma.ower lobe  2. New bronchial wall thickening and nodular opacities in the right likely reflecting bronchiolitis Office Spirometry 09/23/2016-mild restriction of exhaled volume. FVC 3.75/71%, FEV1 3.01/76%, ratio 0.80, FEF 25-75% 2.96/95%. -------------------------------------------------------------------------------------------------------------------  09/21/17- 67 year old male never smoker followed for Allergic rhinitis, allergic asthma/bronchitis, complicated by chronic musculoskeletal pain after injury, GERD/reflux, grade 1 diastolic dysfunction, degenerative disc disease, AFib ---cough, shortness of breath Complains of nasal drip and chest congestion which are chronic in some with weather.  Has a sample of Dymista nasal spray from his allergist but has not tried it.  UTD flu shot  03/25/2018- 67 year old male never smoker followed for Allergic rhinitis, allergic asthma/bronchitis, complicated by chronic musculoskeletal pain after injury, GERD/reflux, grade 1 diastolic dysfunction, degenerative disc disease, AFib/ Eliquis -----Asthmatic Bronchitis: Pt states he went into Afib April 26,2019; pt feels his breathing is normal as long as Afib is stable. Had Cardiology apt yesterday.  MR Brain 03/03/18- --R max sinusitis/ AFL Cervical radiculopathy/ arm pain- treated with steroids Advair 500, Singulair, Flonase Several medical problems have been holding his attention.  Being managed for new onset atrial fibrillation.   Complains of numbness and diminished use in the right hand attributed to cervical radiculopathy, pending neurology visit.  He is satisfied with Advair but willing to try alternative. Routine cough, usually clear dry.  Some nasal discharge without headache.  ROS-see HPI + = positive Constitutional:   No-   weight loss, night sweats, fevers, chills, fatigue, lassitude. HEENT:   No-  headaches, difficulty swallowing, tooth/dental problems, +sore throat,       sneezing, itching, ear ache, + nasal congestion, +post nasal drip,  CV:  chest pain, no-orthopnea, PND, swelling in lower extremities, anasarca,  dizziness, palpitations Resp: No-   shortness of breath with exertion or at rest.             + productive cough,  + non-productive cough,  No- coughing up of blood.              No-   change in color of mucus.  +wheezing.   Skin: No-   rash or lesions. GI:  No-   heartburn, indigestion, abdominal pain, nausea, vomiting,  GU: . MS: +  joint pain or swelling.  No- decreased range of motion.  + back pain. Neuro-     nothing unusual Psych:  No- change in mood or affect. + depression or anxiety.  No memory loss.  OBJ- Physical Exam General- Alert, Oriented, Affect-appropriate, Distress- none acute. Big/ muscular Skin- rash-none, lesions- none, excoriation- none Lymphadenopathy- none Head- atraumatic            Eyes- Gross vision intact, PERRLA, conjunctivae and secretions clear            Ears- Hearing, canals-normal            Nose- Clear, no-Septal dev, mucus, polyps, erosion, perforation             Throat- Mallampati II , mucosa clear(throat lozenges) , drainage- none, tonsils- atrophic Neck- flexible , trachea midline, no stridor , thyroid nl,  carotid no bruit Chest - symmetrical excursion , unlabored           Heart/CV- RRR , no murmur , no gallop  , no rub, nl s1 s2                           - JVD- none , edema- none, stasis changes- none, varices- none           Lung- + coarse wheeze  bilaterally/unlabored, dullness-none, rub- none, cough-none           Chest wall- not obviously tender Abd-  Br/ Gen/ Rectal- Not done, not indicated Extrem- cyanosis- none, clubbing, none, atrophy- none, strength- muscular. + Brace L lower leg, + cane Neuro- grossly intact to observation

## 2018-03-25 NOTE — Patient Instructions (Signed)
Sample Trelegy inhaler   Inhale 1 puff, then rinse your mouth, once daily.   Try this instead of Advair, and when the sample runs out, go back to Advair for comparison.   Script sent for Zpak    See if this clears any trace of sinus infection you are feeling.

## 2018-03-25 NOTE — Telephone Encounter (Signed)
Spoke with pt and updated with lab results and well as PA recommendation. Pt verbalized understanding. Pt also states he was told when he picked his medication to be cautious with taking the Celebrex and Eliquis. Pt was informed that per Pharm D celebrex causes GI irration which can cause ulcers and the increase risk of bleeding due to taking Eliquis. She advised to use only as needed. Pt states he has been taking it for 5 years but will keep advise in mind.       Result Notes for Lipid panel   Notes recorded by Harold Hedge, CMA on 03/25/2018 at 12:32 PM EDT Results released in MyChart as well. ------  Notes recorded by Harold Hedge, CMA on 03/25/2018 at 12:31 PM EDT Left message advising patient to contact office to discuss lab results. ------  Notes recorded by Lonn [name], PA-C on 03/24/2018 at 5:03 PM EDT Please let him know that his total cholesterol is good. His back cholesterol is a little bit low, the HDL should be 40 or more.  Hopefully, he will be able to increase his activity, that is the best way to bring that up. His LDL is 120, ideally, it would be less than 100. Encouraged him to decrease the amount of cholesterol in his diet, we can recheck this in 3 months. Thanks

## 2018-03-25 NOTE — Telephone Encounter (Signed)
Pt calling for refills for Eliquis and Metoprolol. E-script sent to pharmacy.

## 2018-03-25 NOTE — Assessment & Plan Note (Signed)
Never clear.  Ongoing concern that he has persistent microaspiration as discussed. Plan-try sample Anoro as alternative to Advair.

## 2018-03-25 NOTE — Telephone Encounter (Signed)
New message     Pt is returning call about lab work.

## 2018-03-26 ENCOUNTER — Telehealth: Payer: Self-pay | Admitting: Internal Medicine

## 2018-03-26 MED ORDER — AZITHROMYCIN 250 MG PO TABS: ORAL_TABLET | ORAL | 0 refills | Status: DC

## 2018-03-26 NOTE — Telephone Encounter (Signed)
Spoke with patient. He stated that he was seen by CY on 03/25/18 and was advised that a Zpak would be sent in for him. He went to the pharmacy and the RX was not there. Advised patient that the RX had not been sent in and I apologized. RX has been sent in to [facility] on [address] [facility]. He is aware. Nothing else needed at time of call.

## 2018-04-05 NOTE — Progress Notes (Signed)
Had to redo letter Epic kicked me out before I was able to complete the letter.

## 2018-04-06 ENCOUNTER — Telehealth: Payer: Self-pay | Admitting: Internal Medicine

## 2018-04-06 NOTE — Telephone Encounter (Signed)
New Message: ° ° ° ° ° °Pt is calling back for lab results °

## 2018-04-06 NOTE — Telephone Encounter (Signed)
Pateint lab results from rhonda Barrett; patient voiced understanding.

## 2018-04-08 ENCOUNTER — Ambulatory Visit (INDEPENDENT_AMBULATORY_CARE_PROVIDER_SITE_OTHER): Payer: Medicare Other

## 2018-04-08 ENCOUNTER — Encounter

## 2018-04-08 ENCOUNTER — Ambulatory Visit: Payer: Medicare Other | Admitting: Neurology

## 2018-04-08 ENCOUNTER — Encounter: Payer: Self-pay | Admitting: Neurology

## 2018-04-08 VITALS — BP 128/72 | HR 70 | Ht 74.0 in | Wt 260.0 lb

## 2018-04-08 DIAGNOSIS — J309 Allergic rhinitis, unspecified: Secondary | ICD-10-CM | POA: Diagnosis not present

## 2018-04-08 DIAGNOSIS — G5621 Lesion of ulnar nerve, right upper limb: Secondary | ICD-10-CM | POA: Diagnosis not present
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
## 2018-04-08 NOTE — Patient Instructions (Signed)
1.  We will order a nerve conduction study as soon as possible to diagnose where the nerve is affected 2.  Do not lean on your elbows 3.  Continue gabapentin 4.  Follow up after testing/further recommendations after testing.

## 2018-04-15 ENCOUNTER — Ambulatory Visit (INDEPENDENT_AMBULATORY_CARE_PROVIDER_SITE_OTHER): Payer: Medicare Other | Admitting: Neurology

## 2018-04-15 ENCOUNTER — Ambulatory Visit (INDEPENDENT_AMBULATORY_CARE_PROVIDER_SITE_OTHER): Payer: Medicare Other | Admitting: *Deleted

## 2018-04-15 DIAGNOSIS — J309 Allergic rhinitis, unspecified: Secondary | ICD-10-CM

## 2018-04-15 DIAGNOSIS — G5621 Lesion of ulnar nerve, right upper limb: Secondary | ICD-10-CM

## 2018-04-15 DIAGNOSIS — G629 Polyneuropathy, unspecified: Secondary | ICD-10-CM

## 2018-04-15 NOTE — Procedures (Signed)
[facility]  [address], [address]  [place], [place] [address] Tel: [phone] Fax:  [phone] Test Date:  04/15/2018  Patient: Kristopher Gay DOB: September 02, 1951 Physician: Narda Amber, DO  Sex: Male Height: 6\' 2"  Ref Phys: Metta Clines, DO  ID#: [id number] Temp: 35.1C Technician:    Patient Complaints: This is a 67 year old man with peripheral neuropathy referred for evaluation of right hand weakness and numbness over the last 2 digits.  NCV & EMG Findings: Extensive electrodiagnostic testing of the right upper extremity and additional studies of the left shows:  1. Right ulnar sensory responses are absent. Left ulnar sensory response shows prolonged latency (4.9 ms) and normal amplitude. Bilateral median sensory responses show reduced amplitude (R7.7, L7.8 V).  Bilateral radial sensory responses are within normal limits. 2. Right ulnar motor response shows prolonged distal onset latency (3.5 ms), reduced amplitude (1.7 mV), and decreased conduction velocity across the elbow (A Elbow-B Elbow, 28 m/s) at the abductor digiti minimi. Right ulnar motor response at the first dorsal interosseous muscle is barely present distally and unobtainable proximally.  On the left, the ulnar response shows reduced amplitude (6.0 mV), decreased conduction decreased conduction velocity (A Elbow-B Elbow, 32 m/s). Bilateral median motor responses are within normal limits. 3. Chronic motor axon loss changes are seen affecting bilateral ulnar innervated muscles, which is severe on the right and there is evidence of active denervation.   Impression: 1. Right ulnar neuropathy with slowing across the elbow, demyelinating and axonal loss in type; these findings are severe in degree electrically. 2. There is evidence of a chronic sensorimotor polyneuropathy, axon loss and demyelinating in type, affecting the upper extremities.  A superimposed left ulnar neuropathy cannot be excluded.  Correlate  clinically.   ___________________________ Narda Amber, DO    Nerve Conduction Studies Anti Sensory Summary Table   Site NR Peak (ms) Norm Peak (ms) P-T Amp (V) Norm P-T Amp  Left Median Anti Sensory (2nd Digit)  35.1C  Wrist    3.7 <3.8 7.8 >10  Right Median Anti Sensory (2nd Digit)  35.1C  Wrist    3.8 <3.8 7.7 >10  Left Radial Anti Sensory (Base 1st Digit)  35.1C  Wrist    2.8 <2.8 11.6 >10  Right Radial Anti Sensory (Base 1st Digit)  35.1C  Wrist    2.5 <2.8 18.1 >10  Left Ulnar Anti Sensory (5th Digit)  35.1C  Wrist    4.9 <3.2 7.9 >5  Right Ulnar Anti Sensory (5th Digit)  35.1C  Wrist NR  <3.2  >5   Motor Summary Table   Site NR Onset (ms) Norm Onset (ms) O-P Amp (mV) Norm O-P Amp Site1 Site2 Delta-0 (ms) Dist (cm) Vel (m/s) Norm Vel (m/s)  Left Median Motor (Abd Poll Brev)  35.1C  Wrist    3.7 <4.0 8.0 >5 Elbow Wrist 6.5 33.0 51 >50  Elbow    10.2  6.9         Right Median Motor (Abd Poll Brev)  35.1C  Wrist    3.4 <4.0 8.4 >5 Elbow Wrist 6.4 34.0 53 >50  Elbow    9.8  7.9         Left Ulnar Motor (Abd Dig Minimi)  35.1C  Wrist    2.9 <3.1 6.0 >7 B Elbow Wrist 6.2 29.0 47 >50  B Elbow    9.1  5.0  A Elbow B Elbow 3.1 10.0 32 >50  A Elbow    12.2  4.7  Right Ulnar Motor (Abd Dig Minimi)  35.1C  Wrist    3.5 <3.1 1.7 >7 B Elbow Wrist 4.9 26.0 53 >50  B Elbow    8.4  1.3  A Elbow B Elbow 3.6 10.0 28 >50  A Elbow    12.0  1.3         Right Ulnar (FDI) Motor (1st DI)  35.1C  Wrist    4.5 <4.5 0.3 >7 B Elbow Wrist  0.0  >50  B Elbow NR     A Elbow B Elbow  0.0  >50  A Elbow NR             EMG   Side Muscle Ins Act Fibs Psw Fasc Number Recrt Dur Dur. Amp Amp. Poly Poly. Comment  Right 1stDorInt Nml 1+ Nml Nml SMU Rapid All 1+ All 1+ All 1+ ATR  Right Ext Indicis Nml Nml Nml Nml Nml Nml Nml Nml Nml Nml Nml Nml N/A  Right PronatorTeres Nml Nml Nml Nml Nml Nml Nml Nml Nml Nml Nml Nml N/A  Right Biceps Nml Nml Nml Nml Nml Nml Nml Nml Nml Nml Nml Nml  N/A  Right Triceps Nml Nml Nml Nml Nml Nml Nml Nml Nml Nml Nml Nml N/A  Right Deltoid Nml Nml Nml Nml Nml Nml Nml Nml Nml Nml Nml Nml N/A  Right ABD Dig Min Nml 1+ Nml Nml SMU Rapid All 1+ All 1+ All 1+ ATR  Right FlexCarpiUln Nml Nml Nml Nml 3- Rapid Many 1+ Many 1+ Some 1+ N/A  Left 1stDorInt Nml Nml Nml Nml 1- Rapid Some 1+ Some 1+ Nml Nml N/A  Left Ext Indicis Nml Nml Nml Nml Nml Nml Nml Nml Nml Nml Nml Nml N/A  Left FlexCarpiUln Nml Nml Nml Nml 1- Rapid Some 1+ Some 1+ Some 1+ N/A      Waveforms:

## 2018-04-19 ENCOUNTER — Telehealth: Payer: Self-pay

## 2018-04-19 NOTE — Telephone Encounter (Signed)
Called Pt, LMOVM asking him to return my call.  Have filled out referral form to [facility], now [facility]

## 2018-04-19 NOTE — Telephone Encounter (Signed)
-----   Message from Alda Berthold, DO sent at 04/16/2018  4:01 PM EDT ----- Please inform patient that his nerve testing shows right ulnar neuropathy (pinched nerve at the elbow) which is causing his numbness in the last two fingers, in addition to polyneuropathy.  He needs to start wearing an elbow pad to prevent nerve compression and use it as a block to prevent over flexion at the elbow. I also recommend that he see a hand orthopeadic surgeon to see if surgery to release the nerve would help. If agreeable, please send referral to ortho for right ulnar neuropathy along with EMG report and Dr. Georgie Chard last note. Thanks.

## 2018-04-20 NOTE — Telephone Encounter (Addendum)
Pt returned call, advised him of EMG results, elbow pad and surgical consult recommendations. Pt would like to proceed with referral. Faxed referral

## 2018-04-21 NOTE — Telephone Encounter (Signed)
Rcvd fax from [facility]. Pt has appt with Dr Caralyn Guile 04/27/18 @ 4pm

## 2018-04-22 ENCOUNTER — Ambulatory Visit (INDEPENDENT_AMBULATORY_CARE_PROVIDER_SITE_OTHER): Payer: Medicare Other

## 2018-04-22 DIAGNOSIS — J309 Allergic rhinitis, unspecified: Secondary | ICD-10-CM | POA: Diagnosis not present

## 2018-04-27 ENCOUNTER — Ambulatory Visit (INDEPENDENT_AMBULATORY_CARE_PROVIDER_SITE_OTHER): Payer: Medicare Other | Admitting: *Deleted

## 2018-04-27 DIAGNOSIS — J309 Allergic rhinitis, unspecified: Secondary | ICD-10-CM

## 2018-04-27 DIAGNOSIS — G5621 Lesion of ulnar nerve, right upper limb: Secondary | ICD-10-CM | POA: Insufficient documentation

## 2018-04-28 ENCOUNTER — Telehealth: Payer: Self-pay | Admitting: Internal Medicine

## 2018-04-28 NOTE — Telephone Encounter (Signed)
Spoke with patient and he stated he fell while in the hospital several months ago, now he is needing surgery. Explained to patient needed information from Dr  Angus Palms and would reach out to them. Spoke with Dr Angus Palms office and his scheduler did not have information he is needing surgery but she would reach out to Dr Caralyn Guile. Once she receives information she will send to office for clearance.

## 2018-04-28 NOTE — Telephone Encounter (Signed)
New Message   Pt states that Dr. Caralyn Guile is wanting to do a procedure and needs [name]'s approval. Informed pt that Dr. Angus Palms office would have to contact us to do a surgical clearance, but pt verbalized that he wants to give his side of the story for why he is needing the surgery. Please call

## 2018-04-30 NOTE — Telephone Encounter (Signed)
   [facility] Pre-operative Risk Assessment    Request for surgical clearance:  1. What type of surgery is being performed? RIGHT ELBOW ULNAR NERVE RELEASE &/OR TRANSPOSITION   2. When is this surgery scheduled? TBD   3. What type of clearance is required (medical clearance vs. Pharmacy clearance to hold med vs. Both)? BOTH  4. Are there any medications that need to be held prior to surgery and how long? ELIQUIS   5. Practice name and name of physician performing surgery?   [facility]: [facility]  6. What is your office phone number? [phone]    7.   What is your office fax number [phone]  8.   Anesthesia type (None, local, MAC, general)? REGIONAL W/IV SEDATION   Waylan Rocher 04/30/2018, 1:58 PM  _________________________________________________________________   (provider comments below)

## 2018-04-30 NOTE — Telephone Encounter (Signed)
Spoke with the patient, he has multiple questions and concerns. It would be best to do this with an OV- will arrange for Monday.   [name] PA-C 04/30/2018 2:48 PM

## 2018-04-30 NOTE — Telephone Encounter (Signed)
Patient made aware that he has an appointment on 05/03/18 with [name], [facility]. He verbalized his understanding.

## 2018-05-03 ENCOUNTER — Telehealth: Payer: Self-pay | Admitting: Cardiology

## 2018-05-03 ENCOUNTER — Encounter: Payer: Self-pay | Admitting: Cardiology

## 2018-05-03 ENCOUNTER — Ambulatory Visit: Payer: Medicare Other | Admitting: Cardiology

## 2018-05-03 VITALS — BP 134/72 | HR 69 | Ht 74.0 in | Wt 254.8 lb

## 2018-05-03 DIAGNOSIS — Z01810 Encounter for preprocedural cardiovascular examination: Secondary | ICD-10-CM

## 2018-05-03 DIAGNOSIS — G5621 Lesion of ulnar nerve, right upper limb: Secondary | ICD-10-CM | POA: Diagnosis not present

## 2018-05-03 DIAGNOSIS — I480 Paroxysmal atrial fibrillation: Secondary | ICD-10-CM
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
## 2018-05-03 NOTE — Assessment & Plan Note (Signed)
Pt appears to be holding NSR. No symptoms to suggest angina and normal LVF on recent echo

## 2018-05-03 NOTE — Patient Instructions (Signed)
Medication Instructions:  DISCONTINUE Eliquis   Labwork: None   Testing/Procedures: None   Follow-Up: Follow up as scheduled   Any Other Special Instructions Will Be Listed Below (If Applicable). If you need a refill on your cardiac medications before your next appointment, please call your pharmacy.

## 2018-05-03 NOTE — Assessment & Plan Note (Signed)
Needs surgery

## 2018-05-03 NOTE — Telephone Encounter (Signed)
   Primary Cardiologist: [name], MD  Chart reviewed and patient seen and interviewed as part of pre-operative protocol coverage. Given past medical history and time since last visit, based on ACC/AHA guidelines, FIRAS GUARDADO would be at acceptable risk for the planned procedure without further cardiovascular testing.   Ok to stop Eliquis until further notice.  I will route this recommendation to the requesting party via Epic fax function and remove from pre-op pool.  Please call with questions.  [name], PA-C 05/03/2018, 2:58 PM

## 2018-05-03 NOTE — Assessment & Plan Note (Signed)
PAF diagnosed post op April 2019- converted to NSR before discharge sponateously

## 2018-05-04 NOTE — Telephone Encounter (Signed)
Received duplicate clearance fax. Called Santiago Bur who has not received Kristopher Joiner PA clearance note from yesterday. Printed PA office note and clearance note and walked to [facility] and provided her printed copies.

## 2018-05-04 NOTE — Telephone Encounter (Addendum)
Follow up    Patient calling states [facility] needing clearance letter asap, they have not received letter. Patient requesting phone call to confirm we sent clearance letter.

## 2018-05-07 ENCOUNTER — Other Ambulatory Visit: Payer: Self-pay

## 2018-05-07 ENCOUNTER — Encounter (HOSPITAL_COMMUNITY): Payer: Self-pay | Admitting: *Deleted

## 2018-05-07 NOTE — Progress Notes (Signed)
Anesthesia Chart Review:  Case:  [id number] Date/Time:  05/10/18 1645   Procedure:  RIGHT ELBOW ULNAR NERVE RELEASE AND/OR TRANSPOSITION (Right )   Anesthesia type:  Regional   Pre-op diagnosis:  Right elbow ulnar nerve entrapment   Location:  [facility] OR ROOM 06 / [place] OR   Surgeon:  Iran Planas, MD      DISCUSSION: 67 yo male never smoker scheduled for above procedure. Pertinent medical hx includes allergic asthma/bronchitis, HTN, LE Neuropathy, GERD, PVD, paroxysmal Afib.  April 2019 he had PAF post op hernia repair. He converted spontaneously post op to NSR. His echo showed normal LVF and mild LAE.   Per cardiology note from 05/03/18 " He denies noting past palpitations or tachycardia or any symptoms similar since his discharge in April.  It's not clear to me that he knew he was in AF when he was seen in April. He is in NSR today." At this appt he was given cardiovascular clearance per [name], PA-C "Chart reviewed and patient seen and interviewed as part of pre-operative protocol coverage. Given past medical history and time since last visit, based on ACC/AHA guidelines, Kristopher Gay would be at acceptable risk for the planned procedure without further cardiovascular testing. Ok to stop Eliquis until further notice. Recommend telemetry post op if admitted."  Anticipate he can proceed with surgery as scheduled barring any acute status change.  VS: There were no vitals taken for this visit.  PROVIDERS: [name], MD last seen 03/25/2017  [name], MD is Pulmonologist last seen 03/25/2018   [name], MD is Cardiologist, last seen in office by [name], PA-C 05/03/2018   LABS: Labs reviewed: Acceptable for surgery.  Lab Results  Component Value Date   WBC 7.6 03/03/2018   HGB 15.4 03/03/2018   HCT 43.9 03/03/2018   PLT 209 03/03/2018   GLUCOSE 121 (H) 03/03/2018   CHOL 171 03/24/2018   TRIG 74 03/24/2018   HDL 36 (L) 03/24/2018   LDLCALC 120 (H) 03/24/2018   ALT  19 07/23/2017   AST 25 07/23/2017   NA 140 03/03/2018   K 4.2 03/03/2018   CL 110 03/03/2018   CREATININE 1.06 03/03/2018   BUN 23 (H) 03/03/2018   CO2 21 (L) 03/03/2018   TSH 1.970 11/17/2011     IMAGES: CHEST - 2 VIEW 03/03/2018 COMPARISON:  02/19/2018 chest radiograph. FINDINGS: Normal cardiac silhouette. Aortic atherosclerosis with calcification. Linear opacities in the lung bases. No focal consolidation. No pleural effusion or pneumothorax. No acute osseous abnormality is evident. IMPRESSION: Linear opacities in the lung bases compatible with minor atelectasis. Aortic atherosclerosis.   EKG: 05/03/2018: NSR  CV: ECHO 02/22/2018 Study Conclusions  - Left ventricle: The cavity size was normal. Wall thickness was   increased in a pattern of mild LVH. Systolic function was normal.   The estimated ejection fraction was in the range of 50% to 55%.   Wall motion was normal; there were no regional wall motion   abnormalities. Doppler parameters are consistent with abnormal   left ventricular relaxation (grade 1 diastolic dysfunction). - Left atrium: The atrium was mildly dilated. - Right ventricle: The cavity size was mildly dilated. - Right atrium: The atrium was mildly dilated. - Tricuspid valve: There was moderate regurgitation. - Pulmonary arteries: Systolic pressure was moderately to severely   increased.  Impressions:  - Normal LV systolic function; mild diastolic dysfunction; mild   LVH; mild biatrial enlagement; mild RVE; moderate TR; moderate  to   severe pulmonary hypertension.  Past Medical History:  Diagnosis Date  . Allergic rhinitis, cause unspecified   . Allergy   . Anxiety   . Blood clot in vein 2002   left calf SUPERFICIAL CLOT REMOVED THEN PART OF VEIN REMOVED  . Chronic headaches   . COPD (chronic obstructive pulmonary disease) ([place])   . Cough    X 1 YEAR NO FEVER CLEAR SPUTUM  . DDD (degenerative disc disease)   . Depression   . DJD  (degenerative joint disease)    ALL THE WAY DOWN SPINE  . Dropfoot    LEFT , NO BRACE WORN NOW  . Emphysema of lung ([place])   . Extrinsic asthma, unspecified   . GERD (gastroesophageal reflux disease)    OCC NO MEDS FOR  . Hypertension    STOPPED HTN MEDS UNTIL 2011, THEN HAD WEIGHT LOSS, NO MEDS SINCE  . Neuromuscular disorder ([place])   . Neuropathy    POLYNEUOPATHOPATHY FEET AND LEGS  . Peripheral vascular disease (HCC)    VARICOSE VEINS LEFT LEG  . Pneumonia AS CHILD  . Umbilical hernia   . Unspecified asthma(493.90)     Past Surgical History:  Procedure Laterality Date  . COLONSCOPY  06/16/2017  . HERNIA REPAIR    . INGUINAL HERNIA REPAIR Bilateral 07/20/2017   Procedure: LAPAROSCOPIC BILATERAL INGUINAL HERNIA REPAIR WITH MESH, UMBILICAL HERNIA REPAIR AND LYSIS OF ADHESIONS;  Surgeon: [name], [name], MD;  Location: [facility];  Service: General;  Laterality: Bilateral;  . LAPAROSCOPY N/A 07/20/2017   Procedure: LAPAROSCOPY DIAGNOSTIC;  Surgeon: Kieth Brightly [name], MD;  Location: [facility];  Service: General;  Laterality: N/A;  . ROTATOR CUFF REPAIR Left 2005   detached; left shoulder-reattached  . SHOULDER SURGERY Right    laBRAL  tendon torn  . SUPERFICIAL LEFT LEG VEIN STRIPPING WITH SMALL SUPERFICIAL CLOT  2002  . UMBILICAL HERNIA REPAIR  02/18/2018  . UMBILICAL HERNIA REPAIR N/A 02/18/2018   Procedure: LAPAROSCOPIC UMBILICAL HERNIA REPAIR ERAS PATHWAY;  Surgeon: [name], [name], MD;  Location: [place];  Service: General;  Laterality: N/A;    MEDICATIONS: No current facility-administered medications for this encounter.    Marland Kitchen ascorbic acid (VITAMIN C) 1000 MG tablet  . Cholecalciferol (VITAMIN D3) 2000 units TABS  . EPINEPHrine (EPIPEN 2-PAK) 0.3 mg/0.3 mL SOAJ injection  . fluticasone (FLONASE) 50 MCG/ACT nasal spray  . Fluticasone-Salmeterol (ADVAIR DISKUS) 500-50 MCG/DOSE AEPB  . gabapentin (NEURONTIN) 300 MG capsule  . metoprolol succinate (TOPROL-XL) 25 MG 24  hr tablet  . montelukast (SINGULAIR) 10 MG tablet  . polyethylene glycol (MIRALAX / GLYCOLAX) packet  . vitamin B-12 (CYANOCOBALAMIN) 1000 MCG tablet  . apixaban (ELIQUIS) 5 MG TABS tablet  . celecoxib (CELEBREX) 200 MG capsule  . glucosamine-chondroitin 500-400 MG tablet  . Multiple Vitamin (MULTIVITAMIN) tablet     Wynonia Musty [facility] Short Stay Center/Anesthesiology Phone [phone] 05/07/2018 9:15 AM

## 2018-05-07 NOTE — H&P (Signed)
Kristopher Gay is an 67 y.o. male.   Chief Complaint: RIGHT ARM NUMBNESS  HPI: [name] is a 66y/o right hand dominant male who has had weakness, numbness, and discomfort in the right arm that radiates to the fourth and fifth digits. This started in April 2019 and he has not had any improvement in his symptoms.  Nerve conduction study was completed and showed the ulnar nerve compression at the elbow.  He was seen in our office where the treatment options were reviewed. Discussed the reason and rationale for surgery.  The patient is here today for surgery.  He denies chest pain, shortness of breath, nausea, vomiting, diarrhea, or fever.     Past Medical History:  Diagnosis Date  . Allergic rhinitis, cause unspecified   . Allergy   . Anxiety   . Blood clot in vein 2002   left calf SUPERFICIAL CLOT REMOVED THEN PART OF VEIN REMOVED  . Chronic headaches   . COPD (chronic obstructive pulmonary disease) ([place])   . Cough    X 1 YEAR NO FEVER CLEAR SPUTUM  . DDD (degenerative disc disease)   . Depression   . DJD (degenerative joint disease)    ALL THE WAY DOWN SPINE  . Dropfoot    LEFT , NO BRACE WORN NOW  . Emphysema of lung (Kristopher Gay)   . Extrinsic asthma, unspecified   . GERD (gastroesophageal reflux disease)    OCC NO MEDS FOR  . Hypertension    STOPPED HTN MEDS UNTIL 2011, THEN HAD WEIGHT LOSS, NO MEDS SINCE  . Neuromuscular disorder (Kristopher Gay)   . Neuropathy    POLYNEUOPATHOPATHY FEET AND LEGS  . Peripheral vascular disease (HCC)    VARICOSE VEINS LEFT LEG  . Pneumonia AS CHILD  . Umbilical hernia   . Unspecified asthma(493.90)     Past Surgical History:  Procedure Laterality Date  . COLONSCOPY  06/16/2017  . HERNIA REPAIR    . INGUINAL HERNIA REPAIR Bilateral 07/20/2017   Procedure: LAPAROSCOPIC BILATERAL INGUINAL HERNIA REPAIR WITH MESH, UMBILICAL HERNIA REPAIR AND LYSIS OF ADHESIONS;  Surgeon: [name], [name], MD;  Location[facility];  Service: General;  Laterality:  Bilateral;  . LAPAROSCOPY N/A 07/20/2017   Procedure: LAPAROSCOPY DIAGNOSTIC;  Surgeon: Kristopher Gay [name], MD;  Location: [facility];  Service: General;  Laterality: N/A;  . ROTATOR CUFF REPAIR Left 2005   detached; left shoulder-reattached  . SHOULDER SURGERY Right    laBRAL  tendon torn  . SUPERFICIAL LEFT LEG VEIN STRIPPING WITH SMALL SUPERFICIAL CLOT  2002  . UMBILICAL HERNIA REPAIR  02/18/2018  . UMBILICAL HERNIA REPAIR N/A 02/18/2018   Procedure: LAPAROSCOPIC UMBILICAL HERNIA REPAIR ERAS PATHWAY;  Surgeon: [name], [name], MD;  Location: [place];  Service: General;  Laterality: N/A;    Family History  Problem Relation Age of Onset  . Pancreatic cancer Mother   . Breast cancer Sister   . Epilepsy Brother   . Adrenal disorder Neg Hx    Social History:  reports that he has never smoked. He has never used smokeless tobacco. He reports that he does not drink alcohol or use drugs.  Allergies:  Allergies  Allergen Reactions  . Bee Venom Swelling  . Linzess [Linaclotide] Other (See Comments)    Abdominal pain  . Molds & Smuts Other (See Comments)    Unknown  . Seasonal Ic [Cholestatin] Other (See Comments)    Environmental Allergies    No medications prior to admission.    No  results found for this or any previous visit (from the past 76 hour(s)). No results found.  ROS NO RECENT ILLNESSES OR HOSPITALIZATIONS  There were no vitals taken for this visit. Physical Exam  General Appearance:  Alert, cooperative, no distress, appears stated age  Head:  Normocephalic, without obvious abnormality, atraumatic  Eyes:  Pupils equal, conjunctiva/corneas clear,         Throat: Lips, mucosa, and tongue normal; teeth and gums normal  Neck: No visible masses     Lungs:   respirations unlabored  Chest Wall:  No tenderness or deformity  Heart:  Regular rate and rhythm,  Abdomen:   Soft, non-tender,         Extremities: RUE: SKIN INTACT, FINGERS WARM WELL PERFUSED MARKED  ATROPHY IN HAND INTRINSICS  Pulses: 2+ and symmetric  Skin: Skin color, texture, turgor normal, no rashes or lesions     Neurologic: Normal    Assessment RIGHT ELBOW ULNAR NERVE ENTRAPMENT    Plan RIGHT ELBOW ULNAR NERVE RELEASE AND/OR TRANSPOSITION   WE ARE PLANNING SURGERY FOR YOUR UPPER EXTREMITY. THE RISKS AND BENEFITS OF SURGERY INCLUDE BUT NOT LIMITED TO BLEEDING INFECTION, DAMAGE TO NEARBY NERVES ARTERIES TENDONS, FAILURE OF SURGERY TO ACCOMPLISH ITS INTENDED GOALS, PERSISTENT SYMPTOMS AND NEED FOR FURTHER SURGICAL INTERVENTION. WITH THIS IN MIND WE WILL PROCEED. I HAVE DISCUSSED WITH THE PATIENT THE PRE AND POSTOPERATIVE REGIMEN AND THE DOS AND DON'TS. PT VOICED UNDERSTANDING AND INFORMED CONSENT SIGNED.  R/B/A DISCUSSED WITH PT IN OFFICE.  PT VOICED UNDERSTANDING OF PLAN CONSENT SIGNED DAY OF SURGERY PT SEEN AND EXAMINED PRIOR TO OPERATIVE PROCEDURE/DAY OF SURGERY SITE MARKED. QUESTIONS ANSWERED WILL GO HOME FOLLOWING SURGERY  Kristopher Gay [facility] MD  Kristopher Gay 05/07/2018, 9:14 AM

## 2018-05-07 NOTE — Progress Notes (Signed)
Pt denies any acute cardiopulmonary issues. Pt under the care of Dr. Debara Pickett, Cardiology. Pt denies having a stress test and cardiac cath. Pt stated that last dose of Eliquis was on Friday, May 03, 2018. Pt made aware to stop taking vitamins, fish oil, Glucosamine and herbal medications. Do not take any NSAIDs ie: Ibuprofen, Advil, Naproxen (Aleve), Motrin, BC and Goody Powder. Pt verbalized understanding of all pre-op instructions. See cardiology and anesthesia note.

## 2018-05-10 ENCOUNTER — Ambulatory Visit (HOSPITAL_COMMUNITY)
Admission: RE | Admit: 2018-05-10 | Discharge: 2018-05-10 | Disposition: A | Discharge status: Home / Self Care | Payer: Medicare Other | Source: Ambulatory Visit | Attending: Orthopedic Surgery | Admitting: Orthopedic Surgery

## 2018-05-10 ENCOUNTER — Ambulatory Visit (HOSPITAL_COMMUNITY): Payer: Medicare Other | Admitting: Physician Assistant

## 2018-05-10 ENCOUNTER — Encounter (HOSPITAL_COMMUNITY): Payer: Self-pay | Admitting: Certified Registered Nurse Anesthetist

## 2018-05-10 ENCOUNTER — Encounter (HOSPITAL_COMMUNITY)
Admission: RE | Disposition: A | Discharge status: Home / Self Care | Payer: Self-pay | Source: Ambulatory Visit | Attending: Orthopedic Surgery

## 2018-05-10 DIAGNOSIS — G5621 Lesion of ulnar nerve, right upper limb: Secondary | ICD-10-CM | POA: Diagnosis not present

## 2018-05-10 DIAGNOSIS — I10 Essential (primary) hypertension: Secondary | ICD-10-CM | POA: Insufficient documentation

## 2018-05-10 DIAGNOSIS — Z79899 Other long term (current) drug therapy: Secondary | ICD-10-CM | POA: Diagnosis not present

## 2018-05-10 DIAGNOSIS — E669 Obesity, unspecified: Secondary | ICD-10-CM | POA: Insufficient documentation

## 2018-05-10 DIAGNOSIS — J439 Emphysema, unspecified: Secondary | ICD-10-CM | POA: Diagnosis not present

## 2018-05-10 DIAGNOSIS — I4891 Unspecified atrial fibrillation: Secondary | ICD-10-CM | POA: Diagnosis not present

## 2018-05-10 DIAGNOSIS — Z791 Long term (current) use of non-steroidal anti-inflammatories (NSAID): Secondary | ICD-10-CM | POA: Diagnosis not present

## 2018-05-10 DIAGNOSIS — G629 Polyneuropathy, unspecified: Secondary | ICD-10-CM | POA: Insufficient documentation

## 2018-05-10 DIAGNOSIS — R200 Anesthesia of skin: Secondary | ICD-10-CM | POA: Diagnosis present

## 2018-05-10 DIAGNOSIS — I739 Peripheral vascular disease, unspecified: Secondary | ICD-10-CM | POA: Diagnosis not present

## 2018-05-10 DIAGNOSIS — Z7951 Long term (current) use of inhaled steroids: Secondary | ICD-10-CM | POA: Diagnosis not present

## 2018-05-10 DIAGNOSIS — Z6832 Body mass index (BMI) 32.0-32.9, adult: Secondary | ICD-10-CM | POA: Diagnosis not present

## 2018-05-10 DIAGNOSIS — Z7901 Long term (current) use of anticoagulants: Secondary | ICD-10-CM | POA: Insufficient documentation

## 2018-05-10 HISTORY — PX: ULNAR NERVE TRANSPOSITION: SHX2595

## 2018-05-10 HISTORY — DX: Unspecified atrial fibrillation: I48.91

## 2018-05-10 HISTORY — DX: Other specified postprocedural states: Z98.890

## 2018-05-10 HISTORY — DX: Syncope and collapse: R55

## 2018-05-10 HISTORY — DX: Lesion of ulnar nerve, right upper limb: G56.21

## 2018-05-10 HISTORY — DX: Nausea with vomiting, unspecified: R11.2

## 2018-05-10 LAB — CBC
HCT: 43.7 % (ref 39.0–52.0)
Hemoglobin: 14.5 g/dL (ref 13.0–17.0)
MCH: 30.7 pg (ref 26.0–34.0)
MCHC: 33.2 g/dL (ref 30.0–36.0)
MCV: 92.6 fL (ref 78.0–100.0)
Platelets: 166 10*3/uL (ref 150–400)
RBC: 4.72 MIL/uL (ref 4.22–5.81)
RDW: 13.2 % (ref 11.5–15.5)
WBC: 6.3 10*3/uL (ref 4.0–10.5)

## 2018-05-10 LAB — BASIC METABOLIC PANEL
Anion gap: 9 (ref 5–15)
BUN: 19 mg/dL (ref 8–23)
CO2: 21 mmol/L — ABNORMAL LOW (ref 22–32)
Calcium: 9.1 mg/dL (ref 8.9–10.3)
Chloride: 111 mmol/L (ref 98–111)
Creatinine, Ser: 1.05 mg/dL (ref 0.61–1.24)
GFR calc Af Amer: >60 mL/min (ref >60)
GFR calc non Af Amer: >60 mL/min (ref >60)
Glucose, Bld: 93 mg/dL (ref 70–99)
Potassium: 4.1 mmol/L (ref 3.5–5.1)
Sodium: 141 mmol/L (ref 135–145)

## 2018-05-10 LAB — PROTIME-INR
INR: 1
Prothrombin Time: 13.1 seconds (ref 11.4–15.2)

## 2018-05-10 SURGERY — ULNAR NERVE DECOMPRESSION/TRANSPOSITION
Anesthesia: Monitor Anesthesia Care | Site: Arm Upper | Laterality: Right

## 2018-05-10 MED ORDER — PROMETHAZINE HCL 25 MG/ML IJ SOLN: 6.2500 mg | INTRAMUSCULAR | Status: DC | PRN

## 2018-05-10 MED ORDER — LIDOCAINE 2% (20 MG/ML) 5 ML SYRINGE
INTRAMUSCULAR | Status: DC | PRN
  Administered 2018-05-10: 20 mg via INTRAVENOUS

## 2018-05-10 MED ORDER — HYDROMORPHONE HCL 1 MG/ML IJ SOLN: 0.2500 mg | INTRAMUSCULAR | Status: DC | PRN

## 2018-05-10 MED ORDER — PROPOFOL 10 MG/ML IV BOLUS
INTRAVENOUS | Status: DC | PRN
  Administered 2018-05-10: 100 mg via INTRAVENOUS

## 2018-05-10 MED ORDER — FENTANYL CITRATE (PF) 100 MCG/2ML IJ SOLN
INTRAMUSCULAR | Status: AC
  Administered 2018-05-10: 50 ug via INTRAVENOUS
  Filled 2018-05-10: qty 2

## 2018-05-10 MED ORDER — MEPERIDINE HCL 50 MG/ML IJ SOLN: 6.2500 mg | INTRAMUSCULAR | Status: DC | PRN

## 2018-05-10 MED ORDER — PROPOFOL 500 MG/50ML IV EMUL
INTRAVENOUS | Status: DC | PRN
  Administered 2018-05-10: 100 ug/kg/min via INTRAVENOUS

## 2018-05-10 MED ORDER — FENTANYL CITRATE (PF) 250 MCG/5ML IJ SOLN
INTRAMUSCULAR | Status: AC
  Filled 2018-05-10: qty 5

## 2018-05-10 MED ORDER — CHLORHEXIDINE GLUCONATE 4 % EX LIQD: 60.0000 mL | Freq: Once | CUTANEOUS | Status: DC

## 2018-05-10 MED ORDER — LIDOCAINE 2% (20 MG/ML) 5 ML SYRINGE
INTRAMUSCULAR | Status: AC
  Filled 2018-05-10: qty 5

## 2018-05-10 MED ORDER — BUPIVACAINE HCL (PF) 0.25 % IJ SOLN
INTRAMUSCULAR | Status: AC
  Filled 2018-05-10: qty 30

## 2018-05-10 MED ORDER — CEFAZOLIN SODIUM-DEXTROSE 2-4 GM/100ML-% IV SOLN
2.0000 g | INTRAVENOUS | Status: AC
  Administered 2018-05-10: 2 g via INTRAVENOUS

## 2018-05-10 MED ORDER — PROPOFOL 1000 MG/100ML IV EMUL
INTRAVENOUS | Status: AC
  Filled 2018-05-10: qty 100

## 2018-05-10 MED ORDER — ROPIVACAINE HCL 5 MG/ML IJ SOLN
INTRAMUSCULAR | Status: DC | PRN
  Administered 2018-05-10: 30 mL via PERINEURAL

## 2018-05-10 MED ORDER — FENTANYL CITRATE (PF) 100 MCG/2ML IJ SOLN
50.0000 ug | Freq: Once | INTRAMUSCULAR | Status: AC
  Administered 2018-05-10: 50 ug via INTRAVENOUS

## 2018-05-10 MED ORDER — PROPOFOL 10 MG/ML IV BOLUS
INTRAVENOUS | Status: AC
  Filled 2018-05-10: qty 20

## 2018-05-10 MED ORDER — MIDAZOLAM HCL 2 MG/2ML IJ SOLN
1.0000 mg | Freq: Once | INTRAMUSCULAR | Status: AC
  Administered 2018-05-10: 1 mg via INTRAVENOUS

## 2018-05-10 MED ORDER — MIDAZOLAM HCL 2 MG/2ML IJ SOLN
INTRAMUSCULAR | Status: AC
  Filled 2018-05-10: qty 2

## 2018-05-10 MED ORDER — LACTATED RINGERS IV SOLN
INTRAVENOUS | Status: DC
  Administered 2018-05-10: 10 mL/h via INTRAVENOUS

## 2018-05-10 MED ORDER — MIDAZOLAM HCL 2 MG/2ML IJ SOLN
INTRAMUSCULAR | Status: AC
  Administered 2018-05-10: 1 mg via INTRAVENOUS
  Filled 2018-05-10: qty 2

## 2018-05-10 MED ORDER — PROPOFOL 500 MG/50ML IV EMUL
INTRAVENOUS | Status: AC
  Filled 2018-05-10: qty 50

## 2018-05-10 MED ORDER — CEFTAZIDIME 1 G IJ SOLR
INTRAMUSCULAR | Status: AC
  Filled 2018-05-10: qty 1

## 2018-05-10 MED ORDER — MIDAZOLAM HCL 2 MG/2ML IJ SOLN
0.5000 mg | Freq: Once | INTRAMUSCULAR | Status: AC | PRN
  Administered 2018-05-10: 2 mg via INTRAVENOUS

## 2018-05-10 SURGICAL SUPPLY — 36 items
BANDAGE ACE 3X5.8 VEL STRL LF (GAUZE/BANDAGES/DRESSINGS) ×2 IMPLANT
BANDAGE ACE 4X5 VEL STRL LF (GAUZE/BANDAGES/DRESSINGS) ×2 IMPLANT
BNDG GAUZE ELAST 4 BULKY (GAUZE/BANDAGES/DRESSINGS) IMPLANT
CORDS BIPOLAR (ELECTRODE) ×3 IMPLANT
COVER SURGICAL LIGHT HANDLE (MISCELLANEOUS) ×3 IMPLANT
CUFF TOURNIQUET SINGLE 18IN (TOURNIQUET CUFF) ×3 IMPLANT
DRAPE SURG 17X23 STRL (DRAPES) ×3 IMPLANT
DRSG ADAPTIC 3X8 NADH LF (GAUZE/BANDAGES/DRESSINGS) ×2 IMPLANT
GAUZE SPONGE 4X4 12PLY STRL (GAUZE/BANDAGES/DRESSINGS) ×2 IMPLANT
GLOVE BIOGEL PI IND STRL 8.5 (GLOVE) ×1 IMPLANT
GLOVE BIOGEL PI INDICATOR 8.5 (GLOVE) ×2
GLOVE SURG ORTHO 8.0 STRL STRW (GLOVE) ×3 IMPLANT
GOWN STRL REUS W/ TWL LRG LVL3 (GOWN DISPOSABLE) ×2 IMPLANT
GOWN STRL REUS W/ TWL XL LVL3 (GOWN DISPOSABLE) ×1 IMPLANT
GOWN STRL REUS W/TWL LRG LVL3 (GOWN DISPOSABLE) ×6
GOWN STRL REUS W/TWL XL LVL3 (GOWN DISPOSABLE) ×3
KIT BASIN OR (CUSTOM PROCEDURE TRAY) ×3 IMPLANT
KIT TURNOVER KIT B (KITS) ×3 IMPLANT
NDL HYPO 25GX1X1/2 BEV (NEEDLE) IMPLANT
NEEDLE HYPO 25GX1X1/2 BEV (NEEDLE) IMPLANT
NS IRRIG 1000ML POUR BTL (IV SOLUTION) ×3 IMPLANT
PACK ORTHO EXTREMITY (CUSTOM PROCEDURE TRAY) ×3 IMPLANT
PAD ARMBOARD 7.5X6 YLW CONV (MISCELLANEOUS) ×8 IMPLANT
PAD CAST 3X4 CTTN HI CHSV (CAST SUPPLIES) IMPLANT
PAD CAST 4YDX4 CTTN HI CHSV (CAST SUPPLIES) IMPLANT
PADDING CAST COTTON 3X4 STRL (CAST SUPPLIES) ×6
PADDING CAST COTTON 4X4 STRL (CAST SUPPLIES) ×6
SOAP 2 % CHG 4 OZ (WOUND CARE) ×3 IMPLANT
SOL PREP POV-IOD 4OZ 10% (MISCELLANEOUS) ×3 IMPLANT
SUT MNCRL AB 3-0 PS2 18 (SUTURE) ×2 IMPLANT
SUT PROLENE 3 0 PS 2 (SUTURE) ×2 IMPLANT
SYR CONTROL 10ML LL (SYRINGE) IMPLANT
TOWEL OR 17X26 10 PK STRL BLUE (TOWEL DISPOSABLE) ×3 IMPLANT
TUBE CONNECTING 12'X1/4 (SUCTIONS)
TUBE CONNECTING 12X1/4 (SUCTIONS) IMPLANT
UNDERPAD 30X30 (UNDERPADS AND DIAPERS) ×3 IMPLANT

## 2018-05-10 NOTE — Discharge Instructions (Signed)
KEEP BANDAGE CLEAN AND DRY CALL OFFICE FOR F/U APPT [phone] IN 15 DAYS RX SENT TO [facility] ON [address] KEEP HAND ELEVATED ABOVE HEART OK TO APPLY ICE TO OPERATIVE AREA CONTACT OFFICE IF ANY WORSENING PAIN OR CONCERNS.

## 2018-05-10 NOTE — Transfer of Care (Signed)
Immediate Anesthesia Transfer of Care Note  Patient: Kristopher Gay  Procedure(s) Performed: RIGHT ELBOW ULNAR NERVE RELEASE (Right Arm Upper)  Patient Location: PACU  Anesthesia Type:MAC combined with regional for post-op pain  Level of Consciousness: drowsy  Airway & Oxygen Therapy: Patient Spontanous Breathing and Patient connected to face mask oxygen  Post-op Assessment: Report given to RN and Post -op Vital signs reviewed and stable  Post vital signs: Reviewed and stable  Last Vitals:  Vitals Value Taken Time  BP 128/83 05/10/2018  6:23 PM  Temp 37 C 05/10/2018  6:23 PM  Pulse 63 05/10/2018  6:24 PM  Resp 17 05/10/2018  6:24 PM  SpO2 98 % 05/10/2018  6:24 PM  Vitals shown include unvalidated device data.  Last Pain:  Vitals:   05/10/18 1653  TempSrc:   PainSc: 0-No pain      Patients Stated Pain Goal: 7 (10/62/69 4854)  Complications: No apparent anesthesia complications

## 2018-05-10 NOTE — Op Note (Signed)
PREOPERATIVE DIAGNOSIS: Right elbow severe ulnar nerve neuropathy   POSTOPERATIVE DIAGNOSIS: Same  ATTENDING SURGEON: Dr. Iran Planas who was scrubbed and present for the entire procedure  ASSISTANT SURGEON: None  ANESTHESIA: Supraclavicular block with IV sedation  OPERATIVE PROCEDURE: #1: Right elbow ulnar nerve decompression in situ ulnar nerve decompression  IMPLANTS: None  RADIOGRAPHIC INTERPRETATION: None  SURGICAL INDICATIONS: Right-hand-dominant gentleman with the severe ulnar nerve deficits in the right upper extremity. Patient presented the office and recommended undergo the above procedure. Risks of surgery include but not limited to bleeding infection damage to nearby nerves arteries or tendons loss of motion of the wrists and digits incomplete relief of symptoms and need for further surgical intervention.  SURGICAL TECHNIQUE: Patient is properly identified in the preoperative holding area marked with a permanent marker made on the right elbow to indicate correct operative site. Patient brought back to operating room placed supine on anesthesia and table which the IV sedation was administered. Patient tolerated this well. A well-padded tourniquet was placed on the right brachium and sealed with the appropriate drape. The right upper extremity is then prepped and draped in normal sterile fashion. A timeout was called the correct site was identified and the procedure then begun. The limb was then elevated and using Esmarch exsanguination the tourniquet insufflated. A curvilinear incision was then made of the posterior medial aspect of the elbow. Dissection was then carried down through the skin and subcutaneous tissue. Distal branches of the medial antebrachial cutaneous were then carefully protected.  Following this the ulnar nerve was then carefully identified. The nerve was then dissected free proximally. His release proxy 10-15 cm proximal to the medial epicondyle. The nerve was  then traced down through the Osborne's ligament through the cubital tunnel down through the FCU fascia. The nerve was then carefully protected throughout during its entire released both proximally and distally. The portion of the medial intermuscular septum was then resected proximally. The wound was then thoroughly irrigated. After released both proximally and distally the nerve was then placed through full range of motion with elbow flexion and extension did not appear to be any evidence of subluxation or instability the nerve. Following this the subcutaneous tissues were then closed with Monocryl suture. The tourniquet min deflated. Hemostasis then obtained. Skin was then closed using simple Prolene sutures. Adaptic dressing a sterile compressive bandage then applied. The patient is then placed in well-padded long-arm splint taken recovery room in good condition.  POSTOPERATIVE PLAN: Patient be discharged to home. Seen back now office in approximately 2 weeks for wound check suture removal down to see her therapist per for protective elbow pad begin some gentle active range of motion and nerve gliding exercises.

## 2018-05-10 NOTE — Anesthesia Preprocedure Evaluation (Addendum)
Anesthesia Evaluation  Patient identified by MRN, date of birth, ID band Patient awake    Reviewed: Allergy & Precautions, NPO status , Patient's Chart, lab work & pertinent test results, reviewed documented beta blocker date and time   History of Anesthesia Complications (+) PONV  Airway Mallampati: I  TM Distance: >3 FB Neck ROM: Full    Dental  (+) Caps, Dental Advisory Given   Pulmonary COPD,  COPD inhaler,    breath sounds clear to auscultation       Cardiovascular hypertension, Pt. on medications and Pt. on home beta blockers (-) angina+ dysrhythmias (post op afib, now converted) Atrial Fibrillation  Rhythm:Regular Rate:Normal  4/19 ECHO: EF 50-55%, Normal LV systolic function; mild diastolic dysfunction; mild LVH; mild biatrial enlagement; mild RVE; moderate TR; moderate to severe pulmonary hypertension   Neuro/Psych  Headaches, Anxiety Depression    GI/Hepatic Neg liver ROS, GERD  Controlled,  Endo/Other  obesity  Renal/GU negative Renal ROS     Musculoskeletal  (+) Arthritis ,   Abdominal (+) + obese,   Peds  Hematology  (+) Blood dyscrasia (eliquis: last dose 05/03/18), ,   Anesthesia Other Findings   Reproductive/Obstetrics                            Anesthesia Physical Anesthesia Plan  ASA: III  Anesthesia Plan: Regional and MAC   Post-op Pain Management:    Induction:   PONV Risk Score and Plan: 2 and Ondansetron and Treatment may vary due to age or medical condition  Airway Management Planned: Natural Airway and Simple Face Mask  Additional Equipment:   Intra-op Plan:   Post-operative Plan:   Informed Consent: I have reviewed the patients History and Physical, chart, labs and discussed the procedure including the risks, benefits and alternatives for the proposed anesthesia with the patient or authorized representative who has indicated his/her understanding and  acceptance.   Dental advisory given  Plan Discussed with: CRNA and Surgeon  Anesthesia Plan Comments: (Plan routine monitors, supraclavicular block with sedation)        Anesthesia Quick Evaluation

## 2018-05-10 NOTE — Anesthesia Procedure Notes (Signed)
Anesthesia Regional Block: Supraclavicular block   Pre-Anesthetic Checklist: ,, timeout performed, Correct Patient, Correct Site, Correct Laterality, Correct Procedure, Correct Position, site marked, Risks and benefits discussed,  Surgical consent,  Pre-op evaluation,  At surgeon's request and post-op pain management  Laterality: Right  Prep: chloraprep       Needles:  Injection technique: Single-shot  Needle Type: Echogenic Stimulator Needle     Needle Length: 9cm  Needle Gauge: 21   Needle insertion depth: 5 cm   Additional Needles:   Procedures:,,,, ultrasound used (permanent image in chart),,,,  Motor weakness within 5 minutes.  Narrative:  Start time: 05/10/2018 4:45 PM End time: 05/10/2018 4:50 PM Injection made incrementally with aspirations every 5 mL.  Performed by: Personally  Anesthesiologist: Josephine Igo, MD  Additional Notes: Timeout performed. Patient sedated. Relevant anatomy ID'd using [place]. Incremental 2-19ml injection of LA with frequent aspiration. Patient tolerated procedure well.        Right Supraclavicular Block

## 2018-05-10 NOTE — Anesthesia Postprocedure Evaluation (Signed)
Anesthesia Post Note  Patient: Kristopher Gay  Procedure(s) Performed: RIGHT ELBOW ULNAR NERVE RELEASE (Right Arm Upper)     Patient location during evaluation: PACU Anesthesia Type: Regional and MAC Level of consciousness: awake and alert and oriented Pain management: pain level controlled Vital Signs Assessment: post-procedure vital signs reviewed and stable Respiratory status: spontaneous breathing, nonlabored ventilation and respiratory function stable Cardiovascular status: stable and blood pressure returned to baseline Postop Assessment: no apparent nausea or vomiting Anesthetic complications: no    Last Vitals:  Vitals:   05/10/18 1853 05/10/18 1908  BP: 114/65 117/72  Pulse: (!) 54 (!) 55  Resp: (!) 21 18  Temp:  37 C  SpO2: 94% 96%    Last Pain:  Vitals:   05/10/18 1908  TempSrc:   PainSc: 0-No pain                 Xenia Nile A.

## 2018-05-11 ENCOUNTER — Encounter (HOSPITAL_COMMUNITY): Payer: Self-pay | Admitting: Orthopedic Surgery

## 2018-05-14 ENCOUNTER — Other Ambulatory Visit: Payer: Self-pay | Admitting: Physical Medicine & Rehabilitation

## 2018-05-14 ENCOUNTER — Telehealth: Payer: Self-pay | Admitting: Internal Medicine

## 2018-05-14 MED ORDER — METOPROLOL SUCCINATE ER 25 MG PO TB24: 25.0000 mg | ORAL_TABLET | Freq: Every day | ORAL | 3 refills | Status: DC

## 2018-05-14 NOTE — Telephone Encounter (Signed)
New Message:        *STAT* If patient is at the pharmacy, call can be transferred to refill team.   1. Which medications need to be refilled? (please list name of each medication and dose if known) metoprolol succinate (TOPROL-XL) 25 MG 24 hr tablet  2. Which pharmacy/location (including street and city if local pharmacy) is medication to be sent to?[facility] [place], [place] - [address] AT [address]  3. Do they need a 30 day or 90 day supply? [place]

## 2018-05-14 NOTE — Telephone Encounter (Signed)
RX submitted to pharmacy   

## 2018-05-17 ENCOUNTER — Other Ambulatory Visit: Payer: Self-pay | Admitting: Physical Medicine & Rehabilitation

## 2018-05-17 ENCOUNTER — Other Ambulatory Visit: Payer: Self-pay

## 2018-05-17 MED ORDER — CELECOXIB 100 MG PO CAPS: 100.0000 mg | ORAL_CAPSULE | Freq: Two times a day (BID) | ORAL | 1 refills | Status: DC

## 2018-05-17 NOTE — Telephone Encounter (Signed)
Pt called requesting a refill on Celebrex. I called pt back to see if he discussed the Celebrex and Eliquis taking together per last note. He states he did discuss with Cardiologist and was given the ok to take both together.  Now he is no longer on Eliquis. He needs a prescription for Celebrex sent or called into the pharmacy because he has no more refills. Also while on the phone with him he states he has not had any pain medication in a year because he had hernias and the pain medication made them worse but now he has no more hernias and would like to be back on pain medication.

## 2018-05-17 NOTE — Telephone Encounter (Signed)
Sent to pharmacy 

## 2018-05-17 NOTE — Telephone Encounter (Signed)
:   Celebrex 100 mg twice daily #60 one refill

## 2018-06-09 ENCOUNTER — Ambulatory Visit (INDEPENDENT_AMBULATORY_CARE_PROVIDER_SITE_OTHER): Payer: Medicare Other

## 2018-06-09 DIAGNOSIS — J309 Allergic rhinitis, unspecified: Secondary | ICD-10-CM

## 2018-06-17 ENCOUNTER — Ambulatory Visit (INDEPENDENT_AMBULATORY_CARE_PROVIDER_SITE_OTHER): Payer: Medicare Other | Admitting: *Deleted

## 2018-06-17 DIAGNOSIS — J309 Allergic rhinitis, unspecified: Secondary | ICD-10-CM

## 2018-06-24 ENCOUNTER — Ambulatory Visit (INDEPENDENT_AMBULATORY_CARE_PROVIDER_SITE_OTHER): Payer: Medicare Other | Admitting: *Deleted

## 2018-06-24 DIAGNOSIS — J309 Allergic rhinitis, unspecified: Secondary | ICD-10-CM

## 2018-06-25 ENCOUNTER — Ambulatory Visit: Payer: Medicare Other | Admitting: Internal Medicine

## 2018-06-25 ENCOUNTER — Encounter: Payer: Self-pay | Admitting: Internal Medicine

## 2018-06-25 VITALS — BP 110/78 | HR 66 | Ht 74.0 in | Wt 261.8 lb

## 2018-06-25 DIAGNOSIS — I480 Paroxysmal atrial fibrillation: Secondary | ICD-10-CM | POA: Diagnosis not present

## 2018-06-25 NOTE — Progress Notes (Signed)
OFFICE NOTE  Chief Complaint:  Hospital follow-up  Primary Care Physician: [name], MD  HPI:  Kristopher Gay is a 67 y.o. male with a past medial history significant for COPD, recent hospitalization for hernia repair and postoperative A. fib.  I saw him in the hospital recommended starting Eliquis for a CHADSVASC score of 2 and he was noted then to spontaneously converted to sinus.  He said no recurrent A. fib that he is aware of.  Subsequently was diagnosed with an ulnar neuropathy by Dr. Apolonio Schneiders and underwent surgery.  He says that this did not go well and required a repeat surgery.  He has been undergoing rehabilitation and now is complaining of right shoulder pain.  His Eliquis was permanently discontinued as it was felt that he was low enough risk to not require this by [name], PA-C in discussion with Dr. Sallyanne Kuster, who may have been the doctor of the day when he was seen in the office.  We again addressed his CHADSVASC score.  I believe it is elevated due to his age over 69, however he denies a history of hypertension.  He is only on metoprolol for rate control.  There is questionable history of peripheral vascular disease which would have been another point, otherwise he has a low CHADSVASC score of 1.  PMHx:  Past Medical History:  Diagnosis Date  . A-fib ([place])   . Allergic rhinitis, cause unspecified   . Allergy   . Anxiety   . Blood clot in vein 2002   left calf SUPERFICIAL CLOT REMOVED THEN PART OF VEIN REMOVED  . Chronic headaches   . COPD (chronic obstructive pulmonary disease) ([place])   . Cough    X 1 YEAR NO FEVER CLEAR SPUTUM  . DDD (degenerative disc disease)   . Depression   . DJD (degenerative joint disease)    ALL THE WAY DOWN SPINE  . Dropfoot    LEFT , NO BRACE WORN NOW  . Emphysema of lung ([place])   . Extrinsic asthma, unspecified   . GERD (gastroesophageal reflux disease)    OCC NO MEDS FOR  . Hypertension    STOPPED HTN MEDS UNTIL 2011, THEN HAD  WEIGHT LOSS, NO MEDS SINCE  . Neuromuscular disorder ([place])   . Neuropathy    POLYNEUOPATHOPATHY FEET AND LEGS  . Peripheral vascular disease ([facility])    VARICOSE VEINS LEFT LEG  . Pneumonia AS CHILD  . PONV (postoperative nausea and vomiting)   . Syncope   . Ulnar nerve entrapment at elbow, right   . Umbilical hernia   . Unspecified asthma(493.90)     Past Surgical History:  Procedure Laterality Date  . COLONSCOPY  06/16/2017  . HERNIA REPAIR    . INGUINAL HERNIA REPAIR Bilateral 07/20/2017   Procedure: LAPAROSCOPIC BILATERAL INGUINAL HERNIA REPAIR WITH MESH, UMBILICAL HERNIA REPAIR AND LYSIS OF ADHESIONS;  Surgeon: [name], [name], MD;  Location: [facility];  Service: General;  Laterality: Bilateral;  . LAPAROSCOPY N/A 07/20/2017   Procedure: LAPAROSCOPY DIAGNOSTIC;  Surgeon: Kieth Brightly [name], MD;  Location: [facility];  Service: General;  Laterality: N/A;  . ROTATOR CUFF REPAIR Left 2005   detached; left shoulder-reattached  . SHOULDER SURGERY Right    laBRAL  tendon torn  . SUPERFICIAL LEFT LEG VEIN STRIPPING WITH SMALL SUPERFICIAL CLOT  2002  . ULNAR NERVE TRANSPOSITION Right 05/10/2018   Procedure: RIGHT ELBOW ULNAR NERVE RELEASE;  Surgeon: Iran Planas, MD;  Location: [place];  Service: Orthopedics;  Laterality: Right;  . UMBILICAL HERNIA REPAIR  02/18/2018  . UMBILICAL HERNIA REPAIR N/A 02/18/2018   Procedure: LAPAROSCOPIC UMBILICAL HERNIA REPAIR ERAS PATHWAY;  Surgeon: [name], [name], MD;  Location: [place];  Service: General;  Laterality: N/A;    FAMHx:  Family History  Problem Relation Age of Onset  . Pancreatic cancer Mother   . Breast cancer Sister   . Epilepsy Brother   . Adrenal disorder Neg Hx     SOCHx:   reports that he has never smoked. He has never used smokeless tobacco. He reports that he does not drink alcohol or use drugs.  ALLERGIES:  Allergies  Allergen Reactions  . Bee Venom Swelling  . Linzess [Linaclotide] Other (See Comments)     Abdominal pain  . Molds & Smuts Other (See Comments)    Unknown  . Seasonal Ic [Cholestatin] Other (See Comments)    Environmental Allergies    ROS: Pertinent items noted in HPI and remainder of comprehensive ROS otherwise negative.  HOME MEDS: Current Outpatient Medications on File Prior to Visit  Medication Sig Dispense Refill  . ascorbic acid (VITAMIN C) 1000 MG tablet Take 1,000 mg by mouth daily.     . Calcium Carbonate-Vit D-Min (CALCIUM 1200 PO) Take 1 tablet by mouth daily.    . celecoxib (CELEBREX) 100 MG capsule Take 1 capsule (100 mg total) by mouth 2 (two) times daily. 60 capsule 1  . Cholecalciferol (VITAMIN D3) 2000 units TABS Take 2,000 Units by mouth 2 (two) times daily.     Marland Kitchen EPINEPHrine (EPIPEN 2-PAK) 0.3 mg/0.3 mL SOAJ injection Inject 0.3 mg into the muscle once.     . fluticasone (FLONASE) 50 MCG/ACT nasal spray Place 2 sprays into both nostrils daily. (Patient taking differently: Place 2 sprays into both nostrils daily as needed for allergies. ) 16 g 12  . Fluticasone-Salmeterol (ADVAIR DISKUS) 500-50 MCG/DOSE AEPB INHALE 1 PUFF BY MOUTH TWICE DAILY, THEN RINSE MOUTH (Patient taking differently: Inhale 1 puff into the lungs 2 (two) times daily. ) 1 each 5  . gabapentin (NEURONTIN) 300 MG capsule Take 2 capsules in am, one at midday, and 2 at hs. (Patient taking differently: Take 300-600 mg by mouth See admin instructions. Take 600 mg by mouth in the morning, take 300 mg by mouth at midday and take 600 mg by mouth in the evening) 150 capsule 5  . glucosamine-chondroitin 500-400 MG tablet Take 1 tablet by mouth 2 (two) times daily.     . metoprolol succinate (TOPROL-XL) 25 MG 24 hr tablet Take 1 tablet (25 mg total) by mouth daily. 90 tablet 3  . montelukast (SINGULAIR) 10 MG tablet Take 1 tablet (10 mg total) by mouth at bedtime. 30 tablet 5  . Multiple Vitamin (MULTIVITAMIN) tablet Take 1 tablet by mouth 2 (two) times daily.     . polyethylene glycol (MIRALAX /  GLYCOLAX) packet Take 17 g by mouth daily as needed for mild constipation.    . vitamin B-12 (CYANOCOBALAMIN) 1000 MCG tablet Take 1,000 mcg by mouth daily.     No current facility-administered medications on file prior to visit.     LABS/IMAGING: No results found for this or any previous visit (from the past 48 hour(s)). No results found.  LIPID PANEL:    Component Value Date/Time   CHOL 171 03/24/2018 1046   TRIG 74 03/24/2018 1046   HDL 36 (L) 03/24/2018 1046   CHOLHDL 4.8 03/24/2018 1046   LDLCALC 120 (  H) 03/24/2018 1046     WEIGHTS: Wt Readings from Last 3 Encounters:  06/25/18 261 lb 12.8 oz (118.8 kg)  05/10/18 250 lb (113.4 kg)  05/03/18 254 lb 12.8 oz (115.6 kg)    VITALS: BP 110/78   Pulse 66   Ht 6\' 2"  (1.88 m)   Wt 261 lb 12.8 oz (118.8 kg)   SpO2 93%   BMI 33.61 kg/m   EXAM: General appearance: alert and no distress Neck: no carotid bruit, no JVD and thyroid not enlarged, symmetric, no tenderness/mass/nodules Lungs: clear to auscultation bilaterally Heart: regular rate and rhythm, S1, S2 normal, no murmur, click, rub or gallop Abdomen: soft, non-tender; bowel sounds normal; no masses,  no organomegaly and Obese Extremities: Right forearm is in a brace Pulses: 2+ and symmetric Skin: Skin color, texture, turgor normal. No rashes or lesions Neurologic: Grossly normal Psych: Pleasant  EKG: Deferred  ASSESSMENT: 1. PAF-low CHADSVASC score of 1  PLAN: 1.   Mr. Buxton has not had any recurrent atrial fibrillation since his hernia surgery earlier this year.  He has had 2 further operations on his right arm.  We recommended discontinuing his Eliquis.  Given his very low CHADSVASC score and brief episode of atrial fibrillation which spontaneously converted, he will not require long-term anticoagulation.  I advised him to monitor for recurrent A. fib and to contact us as he may need to be restarted again if his A. fib returns.  He should continue metoprolol for  rate control and A. fib suppression.  Follow-up with me annually or sooner as necessary.  [name], MD, [facility], [place] Director of the [facility] Lipid [facility]  [facility] Diplomate of the American Board of Clinical Lipidology Attending Cardiologist  Direct Dial: [phone]  Fax: [phone]  Website:  [email or web address] 06/25/2018, 4:39 PM

## 2018-06-25 NOTE — Patient Instructions (Signed)
Your physician wants you to follow-up in: ONE YEAR with Dr. [name]. You will receive a reminder letter in the mail two months in advance. If you don't receive a letter, please call our office to schedule the follow-up appointment.  

## 2018-06-26 ENCOUNTER — Other Ambulatory Visit: Payer: Self-pay | Admitting: Allergy

## 2018-07-01 ENCOUNTER — Telehealth: Payer: Self-pay | Admitting: Allergy

## 2018-07-01 ENCOUNTER — Ambulatory Visit (INDEPENDENT_AMBULATORY_CARE_PROVIDER_SITE_OTHER): Payer: Medicare Other | Admitting: *Deleted

## 2018-07-01 ENCOUNTER — Other Ambulatory Visit: Payer: Self-pay | Admitting: Allergy

## 2018-07-01 DIAGNOSIS — J309 Allergic rhinitis, unspecified: Secondary | ICD-10-CM

## 2018-07-01 MED ORDER — MONTELUKAST SODIUM 10 MG PO TABS: 10.0000 mg | ORAL_TABLET | Freq: Every day | ORAL | 0 refills | Status: DC

## 2018-07-01 NOTE — Telephone Encounter (Signed)
Pt came in and made appointment for 07/12/2018  and would like to have the singulair called into his pharmacy .

## 2018-07-08 ENCOUNTER — Ambulatory Visit (INDEPENDENT_AMBULATORY_CARE_PROVIDER_SITE_OTHER): Payer: Medicare Other | Admitting: *Deleted

## 2018-07-08 DIAGNOSIS — J309 Allergic rhinitis, unspecified: Secondary | ICD-10-CM

## 2018-07-12 ENCOUNTER — Encounter: Payer: Self-pay | Admitting: Allergy

## 2018-07-12 ENCOUNTER — Ambulatory Visit: Payer: Medicare Other | Admitting: Allergy

## 2018-07-12 VITALS — BP 120/68 | HR 64 | Temp 98.0°F | Resp 20 | Ht 72.7 in | Wt 264.4 lb

## 2018-07-12 DIAGNOSIS — J3089 Other allergic rhinitis: Secondary | ICD-10-CM | POA: Diagnosis not present

## 2018-07-12 DIAGNOSIS — J4540 Moderate persistent asthma, uncomplicated: Secondary | ICD-10-CM

## 2018-07-12 MED ORDER — MONTELUKAST SODIUM 10 MG PO TABS: 10.0000 mg | ORAL_TABLET | Freq: Every day | ORAL | 5 refills | Status: DC

## 2018-07-12 NOTE — Patient Instructions (Signed)
-   continue allergen immunotherapy (allergy shots) per protocol - continue singulair 10mg  daily  - will provide with Dymista sample to use for nasal congestion and drainage.  Use 1 spray each nostril twice a day.  Let us know if it helps decrease nasal drainage and throat irritation symptoms/hoarseness.   Hold Flonase while using Dymista as Flonase and Astelin are both apart of Dymista.    Let us know if Dymista is effective for you.   - continue Allegra 180mg  daily  - continue you routine medications especially Advair and as needed albuterol and follow-up with Dr. Annamaria Boots for your asthma care and other specialists.     Follow-up 6-9 months or sooner if needed

## 2018-07-12 NOTE — Progress Notes (Signed)
Follow-up Note  RE: Kristopher Gay MRN: [id number] DOB: September 17, 1951 Date of Office Visit: 07/12/2018   History of present illness: Kristopher Gay is a 67 y.o. male presenting today for follow-up of allergic rhinitis.  He also has a history of asthma and is followed by Dr. Annamaria Boots and pulmonary.  He was last seen in the office on August 27, 2017 by myself.  He has had a rather eventful year with development of ulnar neuropathy he is currently in a hard splint.  He states he is having a shoulder MRI this week to assess degree of rotator cuff injury.  He states while he was hospitalized for surgery he had on his arm he had a fall and was found to be in A. fib and required oxygen during his hospitalization.  He was evaluated by cardiology and states they were not able to find a cause for the A. fib.  He has not had any further issues with A. fib since.  He states he also had a hernia repair earlier in the year. In regards to his allergic rhinitis he is on allergen immunotherapy that he had to stop for period of time throughout the hospitalization.  He was able to resume in August and is building back up to maintenance.  He does take Singulair daily and needs a refill of this.  He states he has had some recent congestion with hoarseness and throat clearing but states he has not used Flonase and that he still has the [place] sample that he was provided at last visit in his cabinet. He continues on Advair and as needed albuterol as directed by Dr. Annamaria Boots.  Review of systems: Review of Systems  Constitutional: Negative for chills, fever and malaise/fatigue.  HENT: Positive for congestion. Negative for ear discharge, nosebleeds, sinus pain and sore throat.   Eyes: Negative for pain, discharge and redness.  Respiratory: Negative for cough, shortness of breath and wheezing.   Cardiovascular: Negative for chest pain.  Gastrointestinal: Negative for abdominal pain, constipation, diarrhea, heartburn, nausea and  vomiting.  Musculoskeletal: Positive for falls and joint pain.  Skin: Negative for itching and rash.  Neurological: Negative for headaches.    All other systems negative unless noted above in HPI  Past medical/social/surgical/family history have been reviewed and are unchanged unless specifically indicated below.  No changes  Medication List: Allergies as of 07/12/2018      Reactions   Bee Venom Swelling   Linzess [linaclotide] Other (See Comments)   Abdominal pain   Molds & Smuts Other (See Comments)   Unknown   Seasonal Ic [cholestatin] Other (See Comments)   Environmental Allergies      Medication List        Accurate as of 07/12/18  5:27 PM. Always use your most recent med list.          ascorbic acid 1000 MG tablet Commonly known as:  VITAMIN C Take 1,000 mg by mouth daily.   CALCIUM 1200 PO Take 1 tablet by mouth daily.   celecoxib 100 MG capsule Commonly known as:  CELEBREX Take 1 capsule (100 mg total) by mouth 2 (two) times daily.   DHEA 25 MG Caps Take by mouth.   EPIPEN 2-PAK 0.3 mg/0.3 mL Soaj injection Generic drug:  EPINEPHrine Inject 0.3 mg into the muscle once.   FISH OIL ADULT GUMMIES PO Take by mouth.   fluticasone 50 MCG/ACT nasal spray Commonly known as:  FLONASE Place 2 sprays into both  nostrils daily.   Fluticasone-Salmeterol 500-50 MCG/DOSE Aepb Commonly known as:  ADVAIR INHALE 1 PUFF BY MOUTH TWICE DAILY, THEN RINSE MOUTH   gabapentin 300 MG capsule Commonly known as:  NEURONTIN Take 2 capsules in am, one at midday, and 2 at hs.   glucosamine-chondroitin 500-400 MG tablet Take 1 tablet by mouth 2 (two) times daily.   metoprolol succinate 25 MG 24 hr tablet Commonly known as:  TOPROL-XL Take 1 tablet (25 mg total) by mouth daily.   montelukast 10 MG tablet Commonly known as:  SINGULAIR Take 1 tablet (10 mg total) by mouth at bedtime.   multivitamin tablet Take 1 tablet by mouth 2 (two) times daily.   polyethylene  glycol packet Commonly known as:  MIRALAX / GLYCOLAX Take 17 g by mouth daily as needed for mild constipation.   vitamin B-12 1000 MCG tablet Commonly known as:  CYANOCOBALAMIN Take 1,000 mcg by mouth daily.   Vitamin D3 2000 units Tabs Take 2,000 Units by mouth 2 (two) times daily.   vitamin E 400 UNIT capsule Take 400 Units by mouth daily.       Known medication allergies: Allergies  Allergen Reactions  . Bee Venom Swelling  . Linzess [Linaclotide] Other (See Comments)    Abdominal pain  . Molds & Smuts Other (See Comments)    Unknown  . Seasonal Ic [Cholestatin] Other (See Comments)    Environmental Allergies     Physical examination: Blood pressure 120/68, pulse 64, temperature 98 F (36.7 C), temperature source Oral, resp. rate 20, height 6' 0.7" (1.847 m), weight 264 lb 6.4 oz (119.9 kg), SpO2 97 %.  General: Alert, interactive, in no acute distress. HEENT: PERRLA, TMs pearly gray, turbinates minimally edematous without discharge, post-pharynx non erythematous. Neck: Supple without lymphadenopathy. Lungs: Clear to auscultation without wheezing, rhonchi or rales. {no increased work of breathing. CV: Normal S1, S2 without murmurs. Abdomen: Nondistended, nontender. Skin: Warm and dry, without lesions or rashes.  Right arm in a hard sling the past his elbow Extremities:  No clubbing, cyanosis or edema. Neuro:   Grossly intact.  Diagnositics/Labs:  Spirometry: FEV1: 2.37L 65%, FVC: 2.8L 57%, stable from previous study  Assessment and plan:   Allergic rhinitis - continue allergen immunotherapy (allergy shots) per protocol - continue singulair 10mg  daily  - will provide with Dymista sample to use for nasal congestion and drainage.  Use 1 spray each nostril twice a day.  Let us know if it helps decrease nasal drainage and throat irritation symptoms/hoarseness.   Hold Flonase while using Dymista as Flonase and Astelin are both apart of Dymista.    Let us know if  Dymista is effective for you.   - continue Allegra 180mg  daily  Asthma  - continue you routine medications especially Advair and as needed albuterol and follow-up with Dr. Annamaria Boots for your asthma care and other specialists.     Follow-up 6-9 months or sooner if needed  I appreciate the opportunity to take part in [place]'s care. Please do not hesitate to contact me with questions.  Sincerely,   Prudy Feeler, MD Allergy/Immunology [facility]

## 2018-07-15 ENCOUNTER — Ambulatory Visit (INDEPENDENT_AMBULATORY_CARE_PROVIDER_SITE_OTHER): Payer: Medicare Other | Admitting: *Deleted

## 2018-07-15 DIAGNOSIS — J309 Allergic rhinitis, unspecified: Secondary | ICD-10-CM | POA: Diagnosis not present

## 2018-07-19 ENCOUNTER — Other Ambulatory Visit: Payer: Self-pay | Admitting: Internal Medicine

## 2018-07-19 ENCOUNTER — Other Ambulatory Visit: Payer: Self-pay | Admitting: Physical Medicine & Rehabilitation

## 2018-07-22 ENCOUNTER — Ambulatory Visit (INDEPENDENT_AMBULATORY_CARE_PROVIDER_SITE_OTHER): Payer: Medicare Other | Admitting: *Deleted

## 2018-07-22 DIAGNOSIS — J309 Allergic rhinitis, unspecified: Secondary | ICD-10-CM | POA: Diagnosis not present

## 2018-07-26 ENCOUNTER — Telehealth: Payer: Self-pay | Admitting: Allergy

## 2018-07-26 NOTE — Telephone Encounter (Signed)
Pt called and needs to have Dymista called into [facility] [phone].

## 2018-07-26 NOTE — Telephone Encounter (Signed)
Yes, 1 spray per nostril twice daily as needed.

## 2018-07-26 NOTE — Telephone Encounter (Signed)
Please advise if we can call in and instructions he is a Dr Nelva Bush patient

## 2018-07-27 MED ORDER — AZELASTINE-FLUTICASONE 137-50 MCG/ACT NA SUSP: NASAL | 5 refills | Status: DC

## 2018-07-27 NOTE — Telephone Encounter (Signed)
Sent in new rx to pharmacy left detailed message advising rx sent in

## 2018-07-29 ENCOUNTER — Ambulatory Visit (INDEPENDENT_AMBULATORY_CARE_PROVIDER_SITE_OTHER): Payer: Medicare Other | Admitting: *Deleted

## 2018-07-29 DIAGNOSIS — J309 Allergic rhinitis, unspecified: Secondary | ICD-10-CM | POA: Diagnosis not present

## 2018-08-05 ENCOUNTER — Ambulatory Visit (INDEPENDENT_AMBULATORY_CARE_PROVIDER_SITE_OTHER): Payer: Medicare Other | Admitting: *Deleted

## 2018-08-05 DIAGNOSIS — J309 Allergic rhinitis, unspecified: Secondary | ICD-10-CM | POA: Diagnosis not present

## 2018-08-12 ENCOUNTER — Other Ambulatory Visit: Payer: Self-pay | Admitting: Physical Medicine & Rehabilitation

## 2018-08-12 ENCOUNTER — Ambulatory Visit (INDEPENDENT_AMBULATORY_CARE_PROVIDER_SITE_OTHER): Payer: Medicare Other | Admitting: *Deleted

## 2018-08-12 DIAGNOSIS — J309 Allergic rhinitis, unspecified: Secondary | ICD-10-CM | POA: Diagnosis not present

## 2018-08-19 ENCOUNTER — Ambulatory Visit (INDEPENDENT_AMBULATORY_CARE_PROVIDER_SITE_OTHER): Payer: Medicare Other

## 2018-08-19 DIAGNOSIS — J309 Allergic rhinitis, unspecified: Secondary | ICD-10-CM | POA: Diagnosis not present

## 2018-08-24 DIAGNOSIS — J3089 Other allergic rhinitis: Secondary | ICD-10-CM | POA: Diagnosis not present

## 2018-08-24 NOTE — Progress Notes (Signed)
Vial exp 08-25-19 

## 2018-08-26 ENCOUNTER — Ambulatory Visit (INDEPENDENT_AMBULATORY_CARE_PROVIDER_SITE_OTHER): Payer: Medicare Other | Admitting: *Deleted

## 2018-08-26 DIAGNOSIS — J309 Allergic rhinitis, unspecified: Secondary | ICD-10-CM | POA: Diagnosis not present

## 2018-09-09 ENCOUNTER — Ambulatory Visit (INDEPENDENT_AMBULATORY_CARE_PROVIDER_SITE_OTHER): Payer: Medicare Other | Admitting: *Deleted

## 2018-09-09 DIAGNOSIS — J309 Allergic rhinitis, unspecified: Secondary | ICD-10-CM | POA: Diagnosis not present

## 2018-09-17 ENCOUNTER — Encounter: Payer: Medicare Other | Attending: Registered Nurse | Admitting: Registered Nurse

## 2018-09-17 ENCOUNTER — Encounter: Payer: Self-pay | Admitting: Registered Nurse

## 2018-09-17 VITALS — BP 121/78 | HR 64 | Ht 74.0 in | Wt 252.0 lb

## 2018-09-17 DIAGNOSIS — K219 Gastro-esophageal reflux disease without esophagitis: Secondary | ICD-10-CM | POA: Diagnosis not present

## 2018-09-17 DIAGNOSIS — J449 Chronic obstructive pulmonary disease, unspecified: Secondary | ICD-10-CM | POA: Insufficient documentation

## 2018-09-17 DIAGNOSIS — G8929 Other chronic pain: Secondary | ICD-10-CM

## 2018-09-17 DIAGNOSIS — M25561 Pain in right knee: Secondary | ICD-10-CM | POA: Diagnosis not present

## 2018-09-17 DIAGNOSIS — Z760 Encounter for issue of repeat prescription: Secondary | ICD-10-CM | POA: Insufficient documentation

## 2018-09-17 DIAGNOSIS — M79641 Pain in right hand: Secondary | ICD-10-CM

## 2018-09-17 DIAGNOSIS — M25511 Pain in right shoulder: Secondary | ICD-10-CM | POA: Insufficient documentation

## 2018-09-17 DIAGNOSIS — M25562 Pain in left knee: Secondary | ICD-10-CM | POA: Insufficient documentation

## 2018-09-17 DIAGNOSIS — G629 Polyneuropathy, unspecified: Secondary | ICD-10-CM | POA: Diagnosis not present

## 2018-09-17 DIAGNOSIS — I10 Essential (primary) hypertension: Secondary | ICD-10-CM | POA: Diagnosis not present

## 2018-09-17 DIAGNOSIS — F419 Anxiety disorder, unspecified: Secondary | ICD-10-CM | POA: Insufficient documentation

## 2018-09-17 DIAGNOSIS — G5621 Lesion of ulnar nerve, right upper limb: Secondary | ICD-10-CM | POA: Diagnosis not present

## 2018-09-17 DIAGNOSIS — M5412 Radiculopathy, cervical region: Secondary | ICD-10-CM

## 2018-09-17 DIAGNOSIS — I739 Peripheral vascular disease, unspecified: Secondary | ICD-10-CM | POA: Insufficient documentation

## 2018-09-17 DIAGNOSIS — F329 Major depressive disorder, single episode, unspecified: Secondary | ICD-10-CM | POA: Diagnosis not present

## 2018-09-17 DIAGNOSIS — M25512 Pain in left shoulder: Secondary | ICD-10-CM | POA: Diagnosis not present

## 2018-09-17 DIAGNOSIS — S8410XS Injury of peroneal nerve at lower leg level, unspecified leg, sequela: Secondary | ICD-10-CM

## 2018-09-17 DIAGNOSIS — G609 Hereditary and idiopathic neuropathy, unspecified: Secondary | ICD-10-CM

## 2018-09-17 DIAGNOSIS — M47817 Spondylosis without myelopathy or radiculopathy, lumbosacral region: Secondary | ICD-10-CM | POA: Diagnosis not present

## 2018-09-17 DIAGNOSIS — M542 Cervicalgia: Secondary | ICD-10-CM

## 2018-09-17 DIAGNOSIS — I4891 Unspecified atrial fibrillation: Secondary | ICD-10-CM | POA: Insufficient documentation

## 2018-09-17 DIAGNOSIS — M174 Other bilateral secondary osteoarthritis of knee: Secondary | ICD-10-CM

## 2018-09-17 MED ORDER — TRAMADOL HCL 50 MG PO TABS: 50.0000 mg | ORAL_TABLET | Freq: Two times a day (BID) | ORAL | 1 refills | Status: DC | PRN

## 2018-09-17 MED ORDER — CELECOXIB 100 MG PO CAPS: ORAL_CAPSULE | ORAL | 2 refills | Status: DC
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
## 2018-09-20 ENCOUNTER — Encounter: Payer: Medicare Other | Admitting: Registered Nurse

## 2018-09-22 ENCOUNTER — Ambulatory Visit (INDEPENDENT_AMBULATORY_CARE_PROVIDER_SITE_OTHER): Payer: Medicare Other | Admitting: *Deleted

## 2018-09-22 DIAGNOSIS — J309 Allergic rhinitis, unspecified: Secondary | ICD-10-CM

## 2018-09-27 ENCOUNTER — Ambulatory Visit: Payer: Medicare Other | Admitting: Internal Medicine

## 2018-09-27 ENCOUNTER — Encounter: Payer: Self-pay | Admitting: Internal Medicine

## 2018-09-27 VITALS — BP 124/68 | HR 59 | Ht 74.0 in | Wt 245.0 lb

## 2018-09-27 DIAGNOSIS — I480 Paroxysmal atrial fibrillation: Secondary | ICD-10-CM

## 2018-09-27 DIAGNOSIS — J449 Chronic obstructive pulmonary disease, unspecified: Secondary | ICD-10-CM

## 2018-09-27 NOTE — Patient Instructions (Signed)
Try using just Flonase if the Dymista over dries your throat.  Please call if we can help

## 2018-09-27 NOTE — Progress Notes (Signed)
---------------  Patient ID: Kristopher Gay, male    DOB: [birth year], 67 y.o.   MRN: [id number]  HPI  M never smoker followed for allergic rhinitis and allergic asthma/ bronchitis, complicated by chronic musculoskeletal pain after an injury, GERD/ reflux, AFib Barium Swallow 04/04/14.2 Moderate gastroesophageal reflux to the level of mid esophagus Allergy Profile 10/16/16- Neg-no elevation for local environmental allergens CT chest 01/11/2016-IMPRESSION: 1. 2.2 cm left adrenal adenoma.ower lobe  2. New bronchial wall thickening and nodular opacities in the right likely reflecting bronchiolitis Office Spirometry 09/23/2016-mild restriction of exhaled volume. FVC 3.75/71%, FEV1 3.01/76%, ratio 0.80, FEF 25-75% 2.96/95%. ------------------------------------------------------------------------------------------------------------------- 03/25/2018- 67 year old male never smoker followed for Allergic rhinitis, allergic asthma/bronchitis, complicated by chronic musculoskeletal pain after injury, GERD/reflux, grade 1 diastolic dysfunction, degenerative disc disease, AFib/ Eliquis -----Asthmatic Bronchitis: Pt states he went into Afib April 26,2019; pt feels his breathing is normal as long as Afib is stable. Had Cardiology apt yesterday.  MR Brain 03/03/18- --R max sinusitis/ AFL Cervical radiculopathy/ arm pain- treated with steroids Advair 500, Singulair, Flonase Several medical problems have been holding his attention.  Being managed for new onset atrial fibrillation.  Complains of numbness and diminished use in the right hand attributed to cervical radiculopathy, pending neurology visit.  He is satisfied with Advair but willing to try alternative. Routine cough, usually clear dry.  Some nasal discharge without headache.  09/27/2018- 67 year old male never smoker followed for Allergic rhinitis, allergic asthma/bronchitis, complicated by chronic musculoskeletal pain after injury, GERD/reflux, grade 1 diastolic  dysfunction, degenerative disc disease, AFib/ Eliquis -----pt states he is doing well, notes stable sob with exertion.  Dymista nasal spray, [place] 500, Singulair He describes multiple orthopedic problems but denies recent respiratory issues or exacerbations.  [facility] and they fill some of his respiratory meds.  He did not want to stop coming to this office. CXR 03/03/2018 IMPRESSION: Linear opacities in the lung bases compatible with minor atelectasis. Aortic atherosclerosis.  ROS-see HPI             + = positive Constitutional:   No-   weight loss, night sweats, fevers, chills, fatigue, lassitude. HEENT:   No-  headaches, difficulty swallowing, tooth/dental problems, +sore throat,       sneezing, itching, ear ache,  nasal congestion, +post nasal drip,  CV:  chest pain, no-orthopnea, PND, swelling in lower extremities, anasarca,  dizziness, palpitations Resp: No-   shortness of breath with exertion or at rest.             + productive cough,  + non-productive cough,  No- coughing up of blood.              No-   change in color of mucus.  +wheezing.   Skin: No-   rash or lesions. GI:  No-   heartburn, indigestion, abdominal pain, nausea, vomiting,  GU: . MS: +  joint pain or swelling.  No- decreased range of motion.  + back pain. Neuro-     nothing unusual Psych:  No- change in mood or affect. + depression or anxiety.  No memory loss.  OBJ- Physical Exam General- Alert, Oriented, Affect-appropriate, Distress- none acute. Big/ muscular Skin- rash-none, lesions- none, excoriation- none Lymphadenopathy- none Head- atraumatic            Eyes- Gross vision intact, PERRLA, conjunctivae and secretions clear            Ears- Hearing, canals-normal  Nose- Clear, no-Septal dev, mucus, polyps, erosion, perforation             Throat- Mallampati II , mucosa clear , drainage- none, tonsils- atrophic Neck- flexible , trachea midline, no stridor , thyroid  nl, carotid no bruit Chest - symmetrical excursion , unlabored           Heart/CV- RRR , no murmur , no gallop  , no rub, nl s1 s2                           - JVD- none , edema- none, stasis changes- none, varices- none           Lung- clear, dullness-none, rub- none, cough-none           Chest wall- not obviously tender Abd-  Br/ Gen/ Rectal- Not done, not indicated Extrem- cyanosis- none, clubbing, none, atrophy- none, strength- muscular. + Brace L lower leg,  + cane Neuro- grossly intact to observation

## 2018-09-28 NOTE — Assessment & Plan Note (Signed)
Him is regular at this visit consistent with normal sinus.  He continues to be followed by cardiology.

## 2018-09-28 NOTE — Assessment & Plan Note (Signed)
Good control without acute episodes or need for prednisone.  We reviewed medications and he can call for refills if needed.

## 2018-09-30 ENCOUNTER — Ambulatory Visit (INDEPENDENT_AMBULATORY_CARE_PROVIDER_SITE_OTHER): Payer: Medicare Other | Admitting: *Deleted

## 2018-09-30 DIAGNOSIS — J309 Allergic rhinitis, unspecified: Secondary | ICD-10-CM | POA: Diagnosis not present

## 2018-10-07 ENCOUNTER — Ambulatory Visit (INDEPENDENT_AMBULATORY_CARE_PROVIDER_SITE_OTHER): Payer: Medicare Other | Admitting: *Deleted

## 2018-10-07 DIAGNOSIS — J309 Allergic rhinitis, unspecified: Secondary | ICD-10-CM

## 2018-10-13 ENCOUNTER — Telehealth: Payer: Self-pay | Admitting: Internal Medicine

## 2018-10-13 MED ORDER — FLUTICASONE-UMECLIDIN-VILANT 100-62.5-25 MCG/INH IN AEPB: 1.0000 | INHALATION_SPRAY | Freq: Every day | RESPIRATORY_TRACT | 12 refills | Status: DC

## 2018-10-13 NOTE — Telephone Encounter (Signed)
rx sent to pharmacy as requested. atc pt to make aware, no answer and no vm.  Just need to let pt know that rx is ready for pickup at pharmacy.

## 2018-10-13 NOTE — Telephone Encounter (Signed)
Ok to Rx Trelegy, # 1, inhale 1 puff, then rinse mouth, once daily, ref x 12

## 2018-10-13 NOTE — Telephone Encounter (Signed)
Pt is calling back [phone]

## 2018-10-13 NOTE — Telephone Encounter (Signed)
lmtcb for pt.  

## 2018-10-13 NOTE — Telephone Encounter (Signed)
Spoke with the pt  He states needing rx for trelegy  He states a sample was given to him as he was leaving last ov  It unfortunately was never documented  I advised the pt that I would make sure it's ok with Dr Annamaria Boots before I send and I would need to call him back  He got upset and said that he was receiving too many phone calls back and forth  He states "you just do what you want to do and I will do what I want to do" Please advise if okay to send rx thanks

## 2018-10-13 NOTE — Telephone Encounter (Signed)
atc pt X2, line rang to fast busy signal.  Wcb.  

## 2018-10-13 NOTE — Telephone Encounter (Signed)
Patient returned call, advised RX sent to pharmacy.  No call back is needed.

## 2018-10-13 NOTE — Telephone Encounter (Signed)
Patient returned call, CB is [phone].  If not able to reach, he is requesting we leave a message as to what we need.

## 2018-10-14 ENCOUNTER — Ambulatory Visit (INDEPENDENT_AMBULATORY_CARE_PROVIDER_SITE_OTHER): Payer: Medicare Other

## 2018-10-14 DIAGNOSIS — J309 Allergic rhinitis, unspecified: Secondary | ICD-10-CM

## 2018-10-28 ENCOUNTER — Ambulatory Visit (INDEPENDENT_AMBULATORY_CARE_PROVIDER_SITE_OTHER): Payer: Medicare Other

## 2018-10-28 DIAGNOSIS — J309 Allergic rhinitis, unspecified: Secondary | ICD-10-CM

## 2018-11-01 ENCOUNTER — Telehealth: Payer: Self-pay | Admitting: *Deleted

## 2018-11-01 MED ORDER — TRAMADOL HCL 50 MG PO TABS: 50.0000 mg | ORAL_TABLET | Freq: Two times a day (BID) | ORAL | 4 refills | Status: DC

## 2018-11-01 NOTE — Telephone Encounter (Signed)
Kristopher Gay called for refill on his tramadol. I have refilled through his appt with Dr Letta Pate 03/18/19.  I called to notify him and he says that he has been having to go to the pharmacy for a one week supply every week.  I told him that is not from our end, it is the pharmacy.  He says then it was the insurance.  I told him our rx is 30 day supply and he should not be having to go weekly but he will have to take up with the insurance.  He then informed me he had addressed it with them, so going forward it should not be the case.

## 2018-11-11 ENCOUNTER — Ambulatory Visit (INDEPENDENT_AMBULATORY_CARE_PROVIDER_SITE_OTHER): Payer: Medicare Other

## 2018-11-11 DIAGNOSIS — J309 Allergic rhinitis, unspecified: Secondary | ICD-10-CM | POA: Diagnosis not present

## 2018-11-18 ENCOUNTER — Ambulatory Visit (INDEPENDENT_AMBULATORY_CARE_PROVIDER_SITE_OTHER): Payer: Medicare Other

## 2018-11-18 DIAGNOSIS — J309 Allergic rhinitis, unspecified: Secondary | ICD-10-CM | POA: Diagnosis not present

## 2018-11-25 ENCOUNTER — Ambulatory Visit (INDEPENDENT_AMBULATORY_CARE_PROVIDER_SITE_OTHER): Payer: Medicare Other | Admitting: *Deleted

## 2018-11-25 DIAGNOSIS — J309 Allergic rhinitis, unspecified: Secondary | ICD-10-CM

## 2018-11-30 NOTE — Progress Notes (Signed)
Exp 12/02/19

## 2018-12-01 DIAGNOSIS — J3089 Other allergic rhinitis: Secondary | ICD-10-CM

## 2018-12-02 ENCOUNTER — Ambulatory Visit (INDEPENDENT_AMBULATORY_CARE_PROVIDER_SITE_OTHER): Payer: Medicare Other

## 2018-12-02 DIAGNOSIS — J309 Allergic rhinitis, unspecified: Secondary | ICD-10-CM

## 2018-12-09 ENCOUNTER — Ambulatory Visit (INDEPENDENT_AMBULATORY_CARE_PROVIDER_SITE_OTHER): Payer: Medicare Other | Admitting: *Deleted

## 2018-12-09 DIAGNOSIS — J309 Allergic rhinitis, unspecified: Secondary | ICD-10-CM

## 2018-12-23 ENCOUNTER — Ambulatory Visit (INDEPENDENT_AMBULATORY_CARE_PROVIDER_SITE_OTHER): Payer: Medicare Other | Admitting: *Deleted

## 2018-12-23 DIAGNOSIS — J309 Allergic rhinitis, unspecified: Secondary | ICD-10-CM

## 2018-12-30 ENCOUNTER — Ambulatory Visit (INDEPENDENT_AMBULATORY_CARE_PROVIDER_SITE_OTHER): Payer: Medicare Other | Admitting: *Deleted

## 2018-12-30 DIAGNOSIS — J309 Allergic rhinitis, unspecified: Secondary | ICD-10-CM | POA: Diagnosis not present

## 2019-01-06 ENCOUNTER — Ambulatory Visit (INDEPENDENT_AMBULATORY_CARE_PROVIDER_SITE_OTHER): Payer: Medicare Other | Admitting: *Deleted

## 2019-01-06 DIAGNOSIS — J309 Allergic rhinitis, unspecified: Secondary | ICD-10-CM

## 2019-01-13 ENCOUNTER — Ambulatory Visit (INDEPENDENT_AMBULATORY_CARE_PROVIDER_SITE_OTHER): Payer: Medicare Other

## 2019-01-13 DIAGNOSIS — J309 Allergic rhinitis, unspecified: Secondary | ICD-10-CM

## 2019-01-20 ENCOUNTER — Ambulatory Visit (INDEPENDENT_AMBULATORY_CARE_PROVIDER_SITE_OTHER): Payer: Medicare Other

## 2019-01-20 DIAGNOSIS — J309 Allergic rhinitis, unspecified: Secondary | ICD-10-CM | POA: Diagnosis not present

## 2019-01-27 ENCOUNTER — Ambulatory Visit (INDEPENDENT_AMBULATORY_CARE_PROVIDER_SITE_OTHER): Payer: Medicare Other

## 2019-01-27 DIAGNOSIS — J309 Allergic rhinitis, unspecified: Secondary | ICD-10-CM

## 2019-02-03 ENCOUNTER — Ambulatory Visit (INDEPENDENT_AMBULATORY_CARE_PROVIDER_SITE_OTHER): Payer: Medicare Other

## 2019-02-03 DIAGNOSIS — J309 Allergic rhinitis, unspecified: Secondary | ICD-10-CM

## 2019-02-10 ENCOUNTER — Ambulatory Visit (INDEPENDENT_AMBULATORY_CARE_PROVIDER_SITE_OTHER): Payer: Medicare Other | Admitting: *Deleted

## 2019-02-10 ENCOUNTER — Other Ambulatory Visit: Payer: Self-pay

## 2019-02-10 ENCOUNTER — Other Ambulatory Visit: Payer: Self-pay | Admitting: Physical Medicine & Rehabilitation

## 2019-02-10 DIAGNOSIS — J309 Allergic rhinitis, unspecified: Secondary | ICD-10-CM

## 2019-02-10 MED ORDER — MONTELUKAST SODIUM 10 MG PO TABS: 10.0000 mg | ORAL_TABLET | Freq: Every day | ORAL | 2 refills | Status: DC

## 2019-02-17 ENCOUNTER — Ambulatory Visit (INDEPENDENT_AMBULATORY_CARE_PROVIDER_SITE_OTHER): Payer: Medicare Other | Admitting: *Deleted

## 2019-02-17 DIAGNOSIS — J309 Allergic rhinitis, unspecified: Secondary | ICD-10-CM

## 2019-02-24 ENCOUNTER — Ambulatory Visit (INDEPENDENT_AMBULATORY_CARE_PROVIDER_SITE_OTHER): Payer: Medicare Other

## 2019-02-24 DIAGNOSIS — J309 Allergic rhinitis, unspecified: Secondary | ICD-10-CM

## 2019-03-03 ENCOUNTER — Ambulatory Visit (INDEPENDENT_AMBULATORY_CARE_PROVIDER_SITE_OTHER): Payer: Medicare Other

## 2019-03-03 DIAGNOSIS — J309 Allergic rhinitis, unspecified: Secondary | ICD-10-CM

## 2019-03-09 NOTE — Progress Notes (Signed)
VIAL EXP 03-08-2020 

## 2019-03-10 ENCOUNTER — Ambulatory Visit (INDEPENDENT_AMBULATORY_CARE_PROVIDER_SITE_OTHER): Payer: Medicare Other

## 2019-03-10 DIAGNOSIS — J309 Allergic rhinitis, unspecified: Secondary | ICD-10-CM | POA: Diagnosis not present

## 2019-03-15 DIAGNOSIS — J3089 Other allergic rhinitis: Secondary | ICD-10-CM | POA: Diagnosis not present

## 2019-03-17 ENCOUNTER — Ambulatory Visit (INDEPENDENT_AMBULATORY_CARE_PROVIDER_SITE_OTHER): Payer: Medicare Other

## 2019-03-17 DIAGNOSIS — J309 Allergic rhinitis, unspecified: Secondary | ICD-10-CM | POA: Diagnosis not present

## 2019-03-18 ENCOUNTER — Encounter: Payer: Self-pay | Admitting: Physical Medicine & Rehabilitation

## 2019-03-18 ENCOUNTER — Ambulatory Visit: Payer: Medicare Other

## 2019-03-18 ENCOUNTER — Other Ambulatory Visit: Payer: Self-pay

## 2019-03-18 ENCOUNTER — Encounter: Payer: Medicare Other | Attending: Physical Medicine & Rehabilitation | Admitting: Physical Medicine & Rehabilitation

## 2019-03-18 VITALS — BP 127/76 | HR 68 | Temp 98.5°F | Ht 74.0 in | Wt 243.0 lb

## 2019-03-18 DIAGNOSIS — Z5181 Encounter for therapeutic drug level monitoring: Secondary | ICD-10-CM | POA: Insufficient documentation

## 2019-03-18 DIAGNOSIS — M25511 Pain in right shoulder: Secondary | ICD-10-CM | POA: Insufficient documentation

## 2019-03-18 DIAGNOSIS — G8929 Other chronic pain: Secondary | ICD-10-CM | POA: Insufficient documentation

## 2019-03-18 MED ORDER — TRAMADOL HCL 50 MG PO TABS: 50.0000 mg | ORAL_TABLET | Freq: Two times a day (BID) | ORAL | 5 refills | Status: DC

## 2019-03-18 MED ORDER — CELECOXIB 100 MG PO CAPS: ORAL_CAPSULE | ORAL | 2 refills | Status: DC
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
## 2019-03-18 NOTE — Patient Instructions (Signed)
Hylan G-F 20 intra-articular injection What is this medicine? HYLAN G-F 20 (HI lan G F 20) is used to treat osteoarthritis of the knee. It lubricates and cushions the joint, reducing pain in the knee. This medicine may be used for other purposes; ask your health care provider or pharmacist if you have questions. COMMON BRAND NAME(S): Synvisc, Synvisc-One What should I tell my health care provider before I take this medicine? They need to know if you have any of these conditions: -severe knee inflammation -skin conditions or sensitivity -skin or joint infection -venous stasis -an unusual or allergic reaction to hylan G-F 20, hyaluronan (sodium hyaluronate), eggs, other medicines, foods, dyes, or preservatives -pregnant or trying to get pregnant -breast-feeding How should I use this medicine? This medicine is for injection into the knee joint. It is given by a health care professional in a hospital or clinic setting. Talk to your pediatrician regarding the use of this medicine in children. This medicine is not approved for use in children. Overdosage: If you think you have taken too much of this medicine contact a poison control center or emergency room at once. NOTE: This medicine is only for you. Do not share this medicine with others. What if I miss a dose? Keep appointments for follow-up doses as directed. For Synvisc, you will need weekly injections for 3 doses. It is important not to miss your dose. If you will receive Synvisc-One, then only 1 injection will be needed. Call your doctor or health care professional if you are unable to keep an appointment. What may interact with this medicine? Do not take this medicine with any of the following medications: -other injections for the joint like steroids or anesthetics -certain skin disinfectants like benzalkonium chloride This list may not describe all possible interactions. Give your health care provider a list of all the medicines, herbs,  non-prescription drugs, or dietary supplements you use. Also tell them if you smoke, drink alcohol, or use illegal drugs. Some items may interact with your medicine. What should I watch for while using this medicine? Tell your doctor or healthcare professional if your symptoms do not start to get better or if they get worse. Your condition will be monitored carefully while you are receiving this medicine. Most persons get pain relief for up to 6 months after treatment. Avoid strenuous activities (high-impact sports, jogging) or major weight-bearing activities for 48 hours after the injection. What side effects may I notice from receiving this medicine? Side effects that you should report to your doctor or health care professional as soon as possible: -allergic reactions like skin rash, itching or hives, swelling of the face, lips, or tongue -difficulty breathing -fever or chills -severe joint pain or swelling -unusual bleeding or bruising Side effects that usually do not require medical attention (report to your doctor or health care professional if they continue or are bothersome): -dizziness -flushing -general ill feeling or flu-like symptoms -headache -minor joint pain or swelling -muscle pain or cramps -pain, redness, irritation or bruising at site of injection This list may not describe all possible side effects. Call your doctor for medical advice about side effects. You may report side effects to FDA at 1-800-FDA-1088. Where should I keep my medicine? This drug is given in a hospital or clinic and will not be stored at home. NOTE: This sheet is a summary. It may not cover all possible information. If you have questions about this medicine, talk to your doctor, pharmacist, or health care provider.  2019 Elsevier/Gold Standard (2015-11-15 11:48:41)

## 2019-03-25 ENCOUNTER — Ambulatory Visit (INDEPENDENT_AMBULATORY_CARE_PROVIDER_SITE_OTHER): Payer: Medicare Other | Admitting: *Deleted

## 2019-03-25 DIAGNOSIS — J309 Allergic rhinitis, unspecified: Secondary | ICD-10-CM | POA: Diagnosis not present

## 2019-04-01 ENCOUNTER — Ambulatory Visit (INDEPENDENT_AMBULATORY_CARE_PROVIDER_SITE_OTHER): Payer: Medicare Other

## 2019-04-01 DIAGNOSIS — J309 Allergic rhinitis, unspecified: Secondary | ICD-10-CM | POA: Diagnosis not present

## 2019-04-08 ENCOUNTER — Ambulatory Visit (INDEPENDENT_AMBULATORY_CARE_PROVIDER_SITE_OTHER): Payer: Medicare Other | Admitting: *Deleted

## 2019-04-08 DIAGNOSIS — J309 Allergic rhinitis, unspecified: Secondary | ICD-10-CM

## 2019-04-12 ENCOUNTER — Other Ambulatory Visit: Payer: Self-pay | Admitting: Registered Nurse

## 2019-04-21 ENCOUNTER — Ambulatory Visit (INDEPENDENT_AMBULATORY_CARE_PROVIDER_SITE_OTHER): Payer: Medicare Other

## 2019-04-21 DIAGNOSIS — J309 Allergic rhinitis, unspecified: Secondary | ICD-10-CM | POA: Diagnosis not present

## 2019-04-28 ENCOUNTER — Ambulatory Visit (INDEPENDENT_AMBULATORY_CARE_PROVIDER_SITE_OTHER): Payer: Medicare Other | Admitting: *Deleted

## 2019-04-28 DIAGNOSIS — J309 Allergic rhinitis, unspecified: Secondary | ICD-10-CM | POA: Diagnosis not present

## 2019-05-02 ENCOUNTER — Other Ambulatory Visit: Payer: Self-pay | Admitting: Internal Medicine

## 2019-05-04 DIAGNOSIS — J3089 Other allergic rhinitis: Secondary | ICD-10-CM

## 2019-05-04 NOTE — Progress Notes (Signed)
Vial exp 05-03-2020

## 2019-05-05 ENCOUNTER — Other Ambulatory Visit: Payer: Self-pay

## 2019-05-05 ENCOUNTER — Ambulatory Visit (INDEPENDENT_AMBULATORY_CARE_PROVIDER_SITE_OTHER): Payer: Medicare Other | Admitting: *Deleted

## 2019-05-05 ENCOUNTER — Telehealth: Payer: Self-pay | Admitting: Allergy

## 2019-05-05 DIAGNOSIS — J309 Allergic rhinitis, unspecified: Secondary | ICD-10-CM

## 2019-05-05 DIAGNOSIS — G5621 Lesion of ulnar nerve, right upper limb: Secondary | ICD-10-CM

## 2019-05-05 NOTE — Telephone Encounter (Signed)
LVM for patient to call back to schedule an appointment with Dr. Nelva Bush, in September, for and OV to continue on injections so insurance will keep covering.

## 2019-05-06 NOTE — Telephone Encounter (Signed)
Added a reminder in the flowsheet for patient's injections to remind patient that they need an OV.

## 2019-05-12 ENCOUNTER — Ambulatory Visit (INDEPENDENT_AMBULATORY_CARE_PROVIDER_SITE_OTHER): Payer: Medicare Other | Admitting: *Deleted

## 2019-05-12 DIAGNOSIS — J309 Allergic rhinitis, unspecified: Secondary | ICD-10-CM | POA: Diagnosis not present

## 2019-05-13 ENCOUNTER — Other Ambulatory Visit: Payer: Self-pay | Admitting: Internal Medicine

## 2019-05-26 ENCOUNTER — Ambulatory Visit (INDEPENDENT_AMBULATORY_CARE_PROVIDER_SITE_OTHER): Payer: Medicare Other

## 2019-05-26 DIAGNOSIS — J309 Allergic rhinitis, unspecified: Secondary | ICD-10-CM

## 2019-06-02 ENCOUNTER — Encounter: Payer: Self-pay | Admitting: Neurology

## 2019-06-02 ENCOUNTER — Ambulatory Visit (INDEPENDENT_AMBULATORY_CARE_PROVIDER_SITE_OTHER): Payer: Medicare Other | Admitting: *Deleted

## 2019-06-02 ENCOUNTER — Ambulatory Visit (INDEPENDENT_AMBULATORY_CARE_PROVIDER_SITE_OTHER): Payer: Medicare Other | Admitting: Neurology

## 2019-06-02 ENCOUNTER — Other Ambulatory Visit: Payer: Self-pay

## 2019-06-02 DIAGNOSIS — G5621 Lesion of ulnar nerve, right upper limb: Secondary | ICD-10-CM | POA: Diagnosis not present

## 2019-06-02 DIAGNOSIS — G629 Polyneuropathy, unspecified: Secondary | ICD-10-CM

## 2019-06-02 DIAGNOSIS — J309 Allergic rhinitis, unspecified: Secondary | ICD-10-CM | POA: Diagnosis not present

## 2019-06-02 NOTE — Procedures (Addendum)
[facility]  [address], [place]  [place],  [address] Tel: [phone] Fax:  [phone] Test Date:  06/02/2019  Patient: Kristopher Gay DOB: 08/21/1951 Physician: Narda Amber, DO  Sex: Male Height: 6\' 2"  Ref Phys: Marin Shutter, MD  ID#: [id number] Temp: 33.0C Technician:    Patient Complaints: This is a 68 year old man with neuropathy and right ulnar neuropathy at the elbow s/p decompression referred for evaluation of ongoing right hand weakness.  NCV & EMG Findings: Extensive electrodiagnostic testing of the right upper extremity shows:  1. Right median sensory response shows reduced amplitude (5.0 V).  Right ulnar sensory response remains absent.  Right radial sensory responses within normal limits 2. Right median motor responses within normal limits.  Right ulnar motor response mains reduced, however amplitude has improved from 1.7 mV to 3.1 mV as compared to prior study on 04/15/2018 and normal latency, which was previously prolonged.  Right ulnar motor response at the first dorsal interosseous remained severely reduced (0.6 mV) with temporal dispersion.  Right ulnar nerve conduction velocity is unchanged and continues to show slowed conduction velocity across the elbow (A Elbow-B Elbow, 28 m/s 3. Active on chronic motor axonal loss changes are seen in the ulnar innervated muscles on the right.  Additionally, chronic motor axonal loss changes are seen in the triceps and pronator teres muscles.  Impression: 1. Severe right ulnar neuropathy at the elbow, which is mildly improved from prior study on April 15, 2018. 2. Chronic sensorimotor predominantly axonal polyneuropathy affecting the right upper extremity.  With the presence of temporal dispersion, a polyradiculoneuropathy cannot be excluded.  Correlate clinically.   3. Chronic C7 radiculopathy affecting the right upper extremity, moderate.   ___________________________ Narda Amber, DO    Nerve Conduction  Studies Anti Sensory Summary Table   Site NR Peak (ms) Norm Peak (ms) P-T Amp (V) Norm P-T Amp  Right Median Anti Sensory (2nd Digit)  33C  Wrist    3.8 <3.8 5.0 >10  Right Radial Anti Sensory (Base 1st Digit)  33C  Wrist    2.3 <2.8 11.6 >10  Right Ulnar Anti Sensory (5th Digit)  33C  Wrist NR  <3.2  >5   Motor Summary Table   Site NR Onset (ms) Norm Onset (ms) O-P Amp (mV) Norm O-P Amp Site1 Site2 Delta-0 (ms) Dist (cm) Vel (m/s) Norm Vel (m/s)  Right Median Motor (Abd Poll Brev)  33C  Wrist    3.6 <4.0 8.8 >5 Elbow Wrist 6.6 33.0 50 >50  Elbow    10.2  8.3         Right Ulnar Motor (Abd Dig Minimi)  33C  Wrist    2.8 <3.1 3.1 >7 B Elbow Wrist 5.1 25.5 50 >50  B Elbow    7.9  2.1  A Elbow B Elbow 3.6 10.0 28 >50  A Elbow    11.5  1.9         Right Ulnar (FDI) Motor (1st DI)  33C  Wrist    3.2 <4.5 0.6 >7 B Elbow Wrist 6.3 25.5 40 >50  B Elbow    9.5  0.6  A Elbow B Elbow 2.9 10.0 34 >50  A Elbow    12.4  0.6          EMG   Side Muscle Ins Act Fibs Psw Fasc Number Recrt Dur Dur. Amp Amp. Poly Poly. Comment  Right Triceps Nml Nml Nml Nml 1- Rapid Some 1+ Some  1+ Some 1+ N/A  Right PronatorTeres Nml Nml Nml Nml 1- Rapid Few 1+ Few 1+ Few 1+ N/A  Right ABD Dig Min Nml Nml Nml Nml 3- Rapid Most 1+ Most 1+ Most 1+ ATR  Right FlexCarpiUln Nml 1+ Nml Nml 3- Rapid Most 1+ Most 1+ Most 1+ N/A  Right Cervical Parasp Low Nml Nml Nml Nml NE - - - - - - - N/A  Right Ext Indicis Nml Nml Nml Nml Nml Nml Nml Nml Nml Nml Nml Nml N/A  Right Deltoid Nml Nml Nml Nml Nml Nml Nml Nml Nml Nml Nml Nml N/A  Right Biceps Nml Nml Nml Nml Nml Nml Nml Nml Nml Nml Nml Nml N/A  Right Abd Poll Brev Nml Nml Nml Nml Nml Nml Nml Nml Nml Nml Nml Nml N/A  Right 1stDorInt Nml 3+ Nml Nml SMU Rapid All 1+ All 1+ All 1+ ATR      Waveforms:

## 2019-06-07 ENCOUNTER — Other Ambulatory Visit: Payer: Self-pay | Admitting: Allergy and Immunology

## 2019-06-07 ENCOUNTER — Other Ambulatory Visit: Payer: Self-pay

## 2019-06-07 MED ORDER — MONTELUKAST SODIUM 10 MG PO TABS: 10.0000 mg | ORAL_TABLET | Freq: Every day | ORAL | 0 refills | Status: DC

## 2019-06-09 ENCOUNTER — Ambulatory Visit (INDEPENDENT_AMBULATORY_CARE_PROVIDER_SITE_OTHER): Payer: Medicare Other | Admitting: *Deleted

## 2019-06-09 DIAGNOSIS — J309 Allergic rhinitis, unspecified: Secondary | ICD-10-CM

## 2019-06-16 ENCOUNTER — Ambulatory Visit (INDEPENDENT_AMBULATORY_CARE_PROVIDER_SITE_OTHER): Payer: Medicare Other | Admitting: *Deleted

## 2019-06-16 DIAGNOSIS — J309 Allergic rhinitis, unspecified: Secondary | ICD-10-CM | POA: Diagnosis not present

## 2019-06-23 ENCOUNTER — Ambulatory Visit (INDEPENDENT_AMBULATORY_CARE_PROVIDER_SITE_OTHER): Payer: Medicare Other | Admitting: *Deleted

## 2019-06-23 DIAGNOSIS — J309 Allergic rhinitis, unspecified: Secondary | ICD-10-CM

## 2019-07-04 NOTE — Progress Notes (Signed)
Cardiology Office Note   Date:  07/06/2019   ID:  Kristopher Gay, DOB 15-Jun-1951, MRN [id number]  PCP:  [name], MD  Cardiologist:  [name], MD EP: None  Chief Complaint  Patient presents with  . Follow-up    atrial fibrillation      History of Present Illness: Kristopher Gay is a 68 y.o. male with PMH of paroxysmal atrial fibrillation and COPD, who presents for routine follow-up of his atrial fibrillation.  He was last evaluated by cardiology at an outpatient visit with Dr. Debara Pickett 05/2018 for routine follow-up of his atrial fibrillation. He was reported to have only had a brief episode following a hernia repair with spontaneous conversion to NSR. His CHADSVASC score was felt to be 1 for age >57 and long term anticoagulation was not felt to be necessary at that time. His last echocardiogram was 01/2018 which showed EF 50-55%, G1DD, no RWMA, mild biatrial enlargement, moderately to severely increased PA systolic pressures.   He returns today for follow-up of his atrial fibrillation. He leads with multiple orthopedic complaints including C-spine, shoulder, and elbow issues for which he has been following with orthopedics. He has no complaints of palpitations. On further questioning he reports several episodes in the past 2 weeks of exertional chest pain. He reports substernal burning chest pain when working in his yard which persists for 1 hour before resolving with rest. He has some SOB at that time but no diaphoresis, dizziness, lightheadedness, or syncope. He also reports DOE which has been occurring with less activity recently. He denies family history of CAD. Risk factors for CAD include HLD (unmanaged) and obesity.     Past Medical History:  Diagnosis Date  . A-fib ([place])   . Allergic rhinitis, cause unspecified   . Allergy   . Anxiety   . Blood clot in vein 2002   left calf SUPERFICIAL CLOT REMOVED THEN PART OF VEIN REMOVED  . Chronic headaches   . COPD (chronic  obstructive pulmonary disease) ([place])   . Cough    X 1 YEAR NO FEVER CLEAR SPUTUM  . DDD (degenerative disc disease)   . Depression   . DJD (degenerative joint disease)    ALL THE WAY DOWN SPINE  . Dropfoot    LEFT , NO BRACE WORN NOW  . Emphysema of lung ([place])   . Extrinsic asthma, unspecified   . GERD (gastroesophageal reflux disease)    OCC NO MEDS FOR  . Hypertension    STOPPED HTN MEDS UNTIL 2011, THEN HAD WEIGHT LOSS, NO MEDS SINCE  . Neuromuscular disorder ([place])   . Neuropathy    POLYNEUOPATHOPATHY FEET AND LEGS  . Peripheral vascular disease ([facility])    VARICOSE VEINS LEFT LEG  . Pneumonia AS CHILD  . PONV (postoperative nausea and vomiting)   . Syncope   . Ulnar nerve entrapment at elbow, right   . Umbilical hernia   . Unspecified asthma(493.90)     Past Surgical History:  Procedure Laterality Date  . COLONSCOPY  06/16/2017  . HERNIA REPAIR    . INGUINAL HERNIA REPAIR Bilateral 07/20/2017   Procedure: LAPAROSCOPIC BILATERAL INGUINAL HERNIA REPAIR WITH MESH, UMBILICAL HERNIA REPAIR AND LYSIS OF ADHESIONS;  Surgeon: [name], [name], MD;  Location: [facility];  Service: General;  Laterality: Bilateral;  . LAPAROSCOPY N/A 07/20/2017   Procedure: LAPAROSCOPY DIAGNOSTIC;  Surgeon: Kieth Brightly [name], MD;  Location: [facility];  Service: General;  Laterality: N/A;  . ROTATOR  CUFF REPAIR Left 2005   detached; left shoulder-reattached  . SHOULDER SURGERY Right    laBRAL  tendon torn  . SUPERFICIAL LEFT LEG VEIN STRIPPING WITH SMALL SUPERFICIAL CLOT  2002  . ULNAR NERVE TRANSPOSITION Right 05/10/2018   Procedure: RIGHT ELBOW ULNAR NERVE RELEASE;  Surgeon: Iran Planas, MD;  Location: [facility];  Service: Orthopedics;  Laterality: Right;  . UMBILICAL HERNIA REPAIR  02/18/2018  . UMBILICAL HERNIA REPAIR N/A 02/18/2018   Procedure: LAPAROSCOPIC UMBILICAL HERNIA REPAIR ERAS PATHWAY;  Surgeon: [name], [name], MD;  Location: [facility];  Service: General;  Laterality: N/A;      Current Outpatient Medications  Medication Sig Dispense Refill  . ascorbic acid (VITAMIN C) 1000 MG tablet Take 1,000 mg by mouth daily.     . Azelastine-Fluticasone (DYMISTA) 137-50 MCG/ACT SUSP Use 1 spray per nostril twice daily as needed 1 Bottle 5  . Calcium Carbonate-Vit D-Min (CALCIUM 1200 PO) Take 1 tablet by mouth daily.    . celecoxib (CELEBREX) 100 MG capsule TAKE 1 CAPSULE(100 MG) BY MOUTH TWICE DAILY 60 capsule 2  . Cholecalciferol (VITAMIN D3) 2000 units TABS Take 2,000 Units by mouth 2 (two) times daily.     Marland Kitchen DHEA 25 MG CAPS Take by mouth.    . EPINEPHrine (EPIPEN 2-PAK) 0.3 mg/0.3 mL SOAJ injection Inject 0.3 mg into the muscle once.     . fluticasone (FLONASE) 50 MCG/ACT nasal spray SHAKE LIQUID AND USE 2 SPRAYS IN EACH NOSTRIL DAILY 16 g 12  . Fluticasone-Umeclidin-Vilant (TRELEGY ELLIPTA) 100-62.5-25 MCG/INH AEPB Inhale 1 puff into the lungs daily. 60 each 12  . gabapentin (NEURONTIN) 300 MG capsule TAKE 2 CAPSULES BY MOUTH IN MORNING, 1 CAPSULE AT MIDDAY AND 2 CAPSULES BY MOUTH AT BEDTIME 150 capsule 5  . glucosamine-chondroitin 500-400 MG tablet Take 1 tablet by mouth 2 (two) times daily.     . Magnesium 250 MG TABS Take by mouth.    . metoprolol succinate (TOPROL-XL) 25 MG 24 hr tablet Take 1 tablet (25 mg total) by mouth daily. PATIENT NEEDS TO CALL AND SCHEDULE AN APPOINTMENT FOR FURTHER REFILLS 1ST ATTEMPT 90 tablet 0  . montelukast (SINGULAIR) 10 MG tablet Take 1 tablet (10 mg total) by mouth at bedtime. 45 tablet 0  . Multiple Vitamin (MULTIVITAMIN) tablet Take 1 tablet by mouth 2 (two) times daily.     . Omega-3 Fatty Acids (FISH OIL ADULT GUMMIES PO) Take by mouth.    . traMADol (ULTRAM) 50 MG tablet Take 1 tablet (50 mg total) by mouth 2 (two) times daily. 60 tablet 5  . vitamin B-12 (CYANOCOBALAMIN) 1000 MCG tablet Take 1,000 mcg by mouth daily.    . vitamin E 400 UNIT capsule Take 400 Units by mouth daily.    Marland Kitchen atorvastatin (LIPITOR) 40 MG tablet Take 1  tablet (40 mg total) by mouth daily. 30 tablet 6  . nitroGLYCERIN (NITROSTAT) 0.4 MG SL tablet Place 1 tablet (0.4 mg total) under the tongue every 5 (five) minutes as needed for chest pain. 25 tablet 3   No current facility-administered medications for this visit.     Allergies:   Bee venom, Linzess [linaclotide], Molds & smuts, and Seasonal ic [cholestatin]    Social History:  The patient  reports that he has never smoked. He has never used smokeless tobacco. He reports that he does not drink alcohol or use drugs.   Family History:  The patient's family history includes Breast cancer in his sister; Epilepsy in  his brother; Pancreatic cancer in his mother.    ROS:  Please see the history of present illness.   Otherwise, review of systems are positive for none.   All other systems are reviewed and negative.    PHYSICAL EXAM: VS:  BP 114/80   Pulse (!) 59   Ht 6\' 2"  (1.88 m)   Wt 259 lb 9.6 oz (117.8 kg)   BMI 33.33 kg/m  , BMI Body mass index is 33.33 kg/m. GEN: Well nourished, well developed, in no acute distress HEENT: sclera anicteric Neck: no JVD, carotid bruits, or masses Cardiac: RRR; no murmurs, rubs, or gallops, trace LLE edema (s/p vein stripping of LLE) Respiratory:  clear to auscultation bilaterally, normal work of breathing GI: soft, obese, nontender, nondistended, + BS MS: no deformity or atrophy Skin: warm and dry, no rash Neuro:  Strength and sensation are intact Psych: euthymic mood, full affect   EKG:  EKG is ordered today. The ekg ordered today demonstrates sinus bradycardia, rate 59 bpm, early repolarization, no STE/D, no TWI, no significant change from previous.    Recent Labs: No results found for requested labs within last 8760 hours.    Lipid Panel    Component Value Date/Time   CHOL 171 03/24/2018 1046   TRIG 74 03/24/2018 1046   HDL 36 (L) 03/24/2018 1046   CHOLHDL 4.8 03/24/2018 1046   LDLCALC 120 (H) 03/24/2018 1046      Wt Readings  from Last 3 Encounters:  07/06/19 259 lb 9.6 oz (117.8 kg)  03/18/19 243 lb (110.2 kg)  09/27/18 245 lb (111.1 kg)      Other studies Reviewed: Additional studies/ records that were reviewed today include:   Echocardiogram 01/2018: Study Conclusions  - Left ventricle: The cavity size was normal. Wall thickness was   increased in a pattern of mild LVH. Systolic function was normal.   The estimated ejection fraction was in the range of 50% to 55%.   Wall motion was normal; there were no regional wall motion   abnormalities. Doppler parameters are consistent with abnormal   left ventricular relaxation (grade 1 diastolic dysfunction). - Left atrium: The atrium was mildly dilated. - Right ventricle: The cavity size was mildly dilated. - Right atrium: The atrium was mildly dilated. - Tricuspid valve: There was moderate regurgitation. - Pulmonary arteries: Systolic pressure was moderately to severely increased.  Impressions:  - Normal LV systolic function; mild diastolic dysfunction; mild   LVH; mild biatrial enlagement; mild RVE; moderate TR; moderate to severe pulmonary hypertension.     ASSESSMENT AND PLAN:  1. Chest pain: patient reports classic angina with chest pain with exertion, as well as some DOE. He has HLD has not been on a statin. - Will plan for a coronary CTA to evaluate chest pain.  - Will send Rx for prn SL nitro - administration instructions reviewed.   2. Paroxysmal atrial fibrillation: CHA2DS2-VASc Score and unadjusted Ischemic Stroke Rate (% per year) continues to be equal to 0.6 % stroke rate/year from a score of 1 Above score calculated as 1 point each if present [CHF, HTN, DM, Vascular=MI/PAD/Aortic Plaque, Age if 65-74, or Male] Above score calculated as 2 points each if present [Age > 75, or Stroke/TIA/TE] - Continue to monitor for recurrent atrial fibrillation or change in medical history that may warrant anticoagulation in the future. If coronary  CTA reveals CAD, will discuss starting apixaban for stroke ppx. - Continue metoprolol for rate control/ Afib suppression.   3.  HLD: LDL 120 on lipids last year. Not on a statin.  - Will start atorvastatin 40mg  daily - Plan to repeat FLP and LFTs in 2 months    Current medicines are reviewed at length with the patient today.  The patient does not have concerns regarding medicines.  The following changes have been made:  Start atorvastatin 40mg  daily. Rx for prn SL nitro sent  Labs/ tests ordered today include:   Orders Placed This Encounter  Procedures  . CT CORONARY MORPH W/CTA COR W/SCORE W/CA W/CM &/OR WO/CM  . CT CORONARY FRACTIONAL FLOW RESERVE DATA PREP  . CT CORONARY FRACTIONAL FLOW RESERVE FLUID ANALYSIS  . Basic metabolic panel  . Lipid Profile  . Hepatic function panel  . EKG 12-Lead     Disposition:   FU with Dr. Debara Pickett in 6 months or sooner pending Coronary CTA results  Signed, Abigail Butts, PA-C  07/06/2019 10:36 AM

## 2019-07-06 ENCOUNTER — Other Ambulatory Visit: Payer: Self-pay

## 2019-07-06 ENCOUNTER — Ambulatory Visit (INDEPENDENT_AMBULATORY_CARE_PROVIDER_SITE_OTHER): Payer: Medicare Other | Admitting: Medical

## 2019-07-06 ENCOUNTER — Encounter: Payer: Self-pay | Admitting: Medical

## 2019-07-06 VITALS — BP 114/80 | HR 59 | Ht 74.0 in | Wt 259.6 lb

## 2019-07-06 DIAGNOSIS — I480 Paroxysmal atrial fibrillation: Secondary | ICD-10-CM

## 2019-07-06 DIAGNOSIS — Z79899 Other long term (current) drug therapy: Secondary | ICD-10-CM

## 2019-07-06 DIAGNOSIS — R072 Precordial pain: Secondary | ICD-10-CM

## 2019-07-06 DIAGNOSIS — R079 Chest pain, unspecified: Secondary | ICD-10-CM | POA: Diagnosis not present

## 2019-07-06 DIAGNOSIS — E785 Hyperlipidemia, unspecified: Secondary | ICD-10-CM

## 2019-07-06 MED ORDER — NITROGLYCERIN 0.4 MG SL SUBL: 0.4000 mg | SUBLINGUAL_TABLET | SUBLINGUAL | 3 refills | Status: DC | PRN

## 2019-07-06 MED ORDER — ATORVASTATIN CALCIUM 40 MG PO TABS: 40.0000 mg | ORAL_TABLET | Freq: Every day | ORAL | 6 refills | Status: DC

## 2019-07-06 NOTE — Patient Instructions (Addendum)
Medication Instructions:  Start atorvastatin 40mg  in the evening Take nitroglycerin 0.4mg  take every 5 minutes x3 The proper use and anticipated side effects of nitroglycerine has been carefully explained.  If a single episode of chest pain is not relieved by one tablet, the patient will try another within 5 minutes; and if this doesn't relieve the pain, the patient is instructed to call 911 for transportation to an emergency department.  If you need a refill on your cardiac medications before your next appointment, please call your pharmacy.  Labwork: bmet 1 week before CT HERE IN OUR OFFICE AT [facility]   You will NOT need to fast   FASTING LIPID PANEL AND LFT IN 2 MONTHS(NOVEMBER 2020) HERE IN OUR OFFICE AT [facility]   Take the provided lab slips with you to the lab for your blood draw.   When you have your labs (blood work) drawn today and your tests are completely normal, you will receive your results only by MyChart Message (if you have MyChart) -OR-  A paper copy in the mail.  If you have any lab test that is abnormal or we need to change your treatment, we will call you to review these results.  Testing/Procedures: CT Angiography (CTA), is a special type of CT scan that uses a computer to produce multi-dimensional views of major blood vessels throughout the body. In CT angiography, a contrast material is injected through an IV to help visualize the blood vessels  Follow-Up: You will need a follow up appointment in 6 months.  Please call our office 2 months in advance to schedule this appointment.  You may see [name], MD Roby Lofts, PA-C or one of the following Advanced Practice Providers on your designated Care Team: [place], [place] . Fabian Sharp, PA-C     At [facility], you and your health needs are our priority.  As part of our continuing mission to provide you with exceptional heart care, we have created designated Provider Care Teams.  These Care Teams include your  primary Cardiologist (physician) and Advanced Practice Providers (APPs -  Physician Assistants and Nurse Practitioners) who all work together to provide you with the care you need, when you need it.  Thank you for choosing [facility] at [facility]!!     Your cardiac CT will be scheduled at one of the below locations:   [facility] [address] [place], [place] [address] [phone]  OR  Please arrive at the [facility] main entrance of [facility] 30-45 minutes prior to test start time. Proceed to the [facility] Radiology Department (first floor) to check-in and test prep.  Please follow these instructions carefully (unless otherwise directed):  Hold all erectile dysfunction medications at least 48 hours prior to test.  On the Night Before the Test: . Be sure to Drink plenty of water. . Do not consume any caffeinated/decaffeinated beverages or chocolate 12 hours prior to your test. . Do not take any antihistamines 12 hours prior to your test. . If you take Metformin do not take 24 hours prior to test.  On the Day of the Test: . Drink plenty of water. Do not drink any water within one hour of the test. . Do not eat any food 4 hours prior to the test. . You may take your regular medications prior to the test.  . Take metoprolol (Lopressor) two hours prior to test. . HOLD Furosemide/Hydrochlorothiazide morning of the test. . FEMALES- please wear  underwire-free bra if available   *For Clinical Staff only. Please instruct patient the following:*        -Drink plenty of water       -Hold Furosemide/hydrochlorothiazide morning of the test       -Take metoprolol (Lopressor) 2 hours prior to test (if applicable).                  -If HR is less than 55 BPM- No Beta Blocker                -IF HR is greater than 55 BPM and patient is less than or equal to 49 yrs old Lopressor 100mg  x1.                -If HR is greater than 55 BPM and patient is greater  than 54 yrs old Lopressor 50 mg x1.     Do not give Lopressor to patients with an allergy to lopressor or anyone with asthma or active COPD symptoms (currently taking steroids).       After the Test: . Drink plenty of water. . After receiving IV contrast, you may experience a mild flushed feeling. This is normal. . On occasion, you may experience a mild rash up to 24 hours after the test. This is not dangerous. If this occurs, you can take Benadryl 25 mg and increase your fluid intake. . If you experience trouble breathing, this can be serious. If it is severe call 911 IMMEDIATELY. If it is mild, please call our office. . If you take any of these medications: Glipizide/Metformin, Avandament, Glucavance, please do not take 48 hours after completing test.    Please contact the cardiac imaging nurse navigator should you have any questions/concerns Marchia Bond, RN Navigator [facility]iac [facility] [phone] Office  [phone] Cell

## 2019-07-07 ENCOUNTER — Telehealth: Payer: Self-pay | Admitting: *Deleted

## 2019-07-07 ENCOUNTER — Ambulatory Visit (INDEPENDENT_AMBULATORY_CARE_PROVIDER_SITE_OTHER): Payer: Medicare Other | Admitting: *Deleted

## 2019-07-07 DIAGNOSIS — J309 Allergic rhinitis, unspecified: Secondary | ICD-10-CM | POA: Diagnosis not present

## 2019-07-07 NOTE — Telephone Encounter (Signed)
Do we even have the flu vaccine available for patients in office yet?  Ideally would recommend he receive the vaccine for 65+ individuals but if he does not want that then the standard flu vaccine will be better than no flu vaccine.   Thus I am fine to give him the flu vaccine we will get for patients.

## 2019-07-07 NOTE — Telephone Encounter (Signed)
Patient came into the office and requested a flu shot. Informed patient that we do not get the 65 and over flu shot in our office. Patient requested to be give the other one as he states "it has more protection than the 83 and old one." Can we give patient his flu shot? Please advise.

## 2019-07-08 LAB — LIPID PANEL
Chol/HDL Ratio: 4.8 ratio (ref 0.0–5.0)
Cholesterol, Total: 184 mg/dL (ref 100–199)
HDL: 38 mg/dL — ABNORMAL LOW (ref >39)
LDL Chol Calc (NIH): 131 mg/dL — ABNORMAL HIGH (ref 0–99)
Triglycerides: 78 mg/dL (ref 0–149)
VLDL Cholesterol Cal: 15 mg/dL (ref 5–40)

## 2019-07-08 LAB — HEPATIC FUNCTION PANEL
ALT: 19 IU/L (ref 0–44)
AST: 23 IU/L (ref 0–40)
Albumin: 4.2 g/dL (ref 3.8–4.8)
Alkaline Phosphatase: 75 IU/L (ref 39–117)
Bilirubin Total: 0.6 mg/dL (ref 0.0–1.2)
Bilirubin, Direct: 0.16 mg/dL (ref 0.00–0.40)
Total Protein: 6.6 g/dL (ref 6.0–8.5)

## 2019-07-08 LAB — BASIC METABOLIC PANEL
BUN/Creatinine Ratio: 15 (ref 10–24)
BUN: 16 mg/dL (ref 8–27)
CO2: 22 mmol/L (ref 20–29)
Calcium: 9.1 mg/dL (ref 8.6–10.2)
Chloride: 105 mmol/L (ref 96–106)
Creatinine, Ser: 1.04 mg/dL (ref 0.76–1.27)
GFR calc Af Amer: 85 mL/min/{1.73_m2} (ref >59)
GFR calc non Af Amer: 74 mL/min/{1.73_m2} (ref >59)
Glucose: 88 mg/dL (ref 65–99)
Potassium: 4.6 mmol/L (ref 3.5–5.2)
Sodium: 139 mmol/L (ref 134–144)

## 2019-07-11 NOTE — Telephone Encounter (Signed)
Informed patient that he can come either Tuesday or Thursday for his flu shot.

## 2019-07-12 ENCOUNTER — Other Ambulatory Visit: Payer: Self-pay

## 2019-07-12 ENCOUNTER — Ambulatory Visit: Payer: Self-pay | Admitting: *Deleted

## 2019-07-12 ENCOUNTER — Ambulatory Visit (INDEPENDENT_AMBULATORY_CARE_PROVIDER_SITE_OTHER): Payer: Medicare Other | Admitting: *Deleted

## 2019-07-12 DIAGNOSIS — Z23 Encounter for immunization: Secondary | ICD-10-CM | POA: Diagnosis not present

## 2019-07-14 ENCOUNTER — Other Ambulatory Visit: Payer: Self-pay | Admitting: Physical Medicine & Rehabilitation

## 2019-07-14 ENCOUNTER — Telehealth (HOSPITAL_COMMUNITY): Payer: Self-pay | Admitting: Emergency Medicine

## 2019-07-14 ENCOUNTER — Ambulatory Visit (INDEPENDENT_AMBULATORY_CARE_PROVIDER_SITE_OTHER): Payer: Medicare Other

## 2019-07-14 ENCOUNTER — Other Ambulatory Visit: Payer: Self-pay | Admitting: Allergy and Immunology

## 2019-07-14 DIAGNOSIS — J309 Allergic rhinitis, unspecified: Secondary | ICD-10-CM | POA: Diagnosis not present

## 2019-07-14 NOTE — Telephone Encounter (Signed)
Pt calling to clarify instructions for CTA; all questions answered. Pt verbalized understanding

## 2019-07-14 NOTE — Telephone Encounter (Signed)
Left message on voicemail with name and callback number Clodagh Odenthal RN Navigator Cardiac Imaging [facility] [phone] Office [phone] Cell  

## 2019-07-15 ENCOUNTER — Other Ambulatory Visit: Payer: Self-pay

## 2019-07-15 ENCOUNTER — Ambulatory Visit (HOSPITAL_COMMUNITY)
Admission: RE | Admit: 2019-07-15 | Discharge: 2019-07-15 | Disposition: A | Discharge status: Home / Self Care | Payer: Medicare Other | Source: Ambulatory Visit | Attending: Medical | Admitting: Medical

## 2019-07-15 DIAGNOSIS — R072 Precordial pain: Secondary | ICD-10-CM | POA: Diagnosis present

## 2019-07-15 MED ORDER — NITROGLYCERIN 0.4 MG SL SUBL
SUBLINGUAL_TABLET | SUBLINGUAL | Status: AC
  Filled 2019-07-15: qty 2

## 2019-07-15 MED ORDER — NITROGLYCERIN 0.4 MG SL SUBL
0.8000 mg | SUBLINGUAL_TABLET | Freq: Once | SUBLINGUAL | Status: AC
  Administered 2019-07-15: 0.8 mg via SUBLINGUAL
  Filled 2019-07-15: qty 25

## 2019-07-15 MED ORDER — IOHEXOL 350 MG/ML SOLN
100.0000 mL | Freq: Once | INTRAVENOUS | Status: AC | PRN
  Administered 2019-07-15: 100 mL via INTRAVENOUS

## 2019-07-15 NOTE — Progress Notes (Signed)
Ct complete. Patient denies any complaints. Offered patient snack and beverage.

## 2019-07-21 ENCOUNTER — Ambulatory Visit (INDEPENDENT_AMBULATORY_CARE_PROVIDER_SITE_OTHER): Payer: Medicare Other

## 2019-07-21 DIAGNOSIS — J309 Allergic rhinitis, unspecified: Secondary | ICD-10-CM | POA: Diagnosis not present

## 2019-07-22 ENCOUNTER — Encounter: Payer: Self-pay | Admitting: Allergy

## 2019-07-22 ENCOUNTER — Other Ambulatory Visit: Payer: Self-pay

## 2019-07-22 ENCOUNTER — Ambulatory Visit (INDEPENDENT_AMBULATORY_CARE_PROVIDER_SITE_OTHER): Payer: Medicare Other | Admitting: Allergy

## 2019-07-22 VITALS — BP 124/76 | HR 62 | Temp 97.7°F | Resp 16

## 2019-07-22 DIAGNOSIS — J4540 Moderate persistent asthma, uncomplicated: Secondary | ICD-10-CM

## 2019-07-22 DIAGNOSIS — J3089 Other allergic rhinitis: Secondary | ICD-10-CM | POA: Diagnosis not present

## 2019-07-22 NOTE — Patient Instructions (Addendum)
-   continue allergen immunotherapy (allergy shots) per protocol - continue singulair 10mg  daily  - continue Dymista 1 spray each nostril twice a day as needed for congestion with drainage.  Can use Flonase alone if only having congestion - continue you routine medications especially Trelegy and as needed albuterol and follow-up with Dr. Annamaria Boots for your asthma care and other specialists.     Follow-up 12 months or sooner if needed

## 2019-07-22 NOTE — Progress Notes (Signed)
Follow-up Note  RE: TRADD SERGI MRN: [id number] DOB: September 17, 1951 Date of Office Visit: 07/22/2019   History of present illness: Kristopher Gay is a 68 y.o. male presenting today for follow-up of allergic rhinitis.  He also has history of asthma that is followed by Dr. Annamaria Boots.  He was last seen in the office on 07/12/2018 by myself.  He states since his last visit he had additional surgery to his right ulnar nerve but does not think it has helped as he still reports difficulty with sensation and motor skills of his hand.  He also states something is torn in his shoulder and needs a replacement.  He states he has tried just about every non-surgical therapy including "platelets".  He also reports having neck pain.   In regards to his allergies he feels pretty under control.  He states he will use either dymista or plan Flonase when he has congestion/drainage or just congestion.  He states he tries to just use the nose sprays when he has symptoms.  He takes singulair daily.   He is not taking an antihsitamine at this time.  He is on allergen immunotherapy and would like to continue at weekly injections as he states he feels better on weekly injectoins.   He was changed to Trelegy around Nov 2019 and continues to follow with Dr. Annamaria Boots for his asthma management.    Review of systems: Review of Systems  Constitutional: Negative for chills, fever and malaise/fatigue.  HENT: Negative for congestion, ear discharge, nosebleeds, sinus pain and sore throat.   Eyes: Negative for pain, discharge and redness.  Respiratory: Negative for cough, shortness of breath and wheezing.   Cardiovascular: Negative for chest pain.  Gastrointestinal: Negative for abdominal pain, constipation, diarrhea, heartburn, nausea and vomiting.  Musculoskeletal: Positive for joint pain and neck pain.  Skin: Negative for itching and rash.  Neurological: Negative for headaches.    All other systems negative unless noted above in HPI   Past medical/social/surgical/family history have been reviewed and are unchanged unless specifically indicated below.  No changes  Medication List: Allergies as of 07/22/2019      Reactions   Bee Venom Swelling   Linzess [linaclotide] Other (See Comments)   Abdominal pain   Molds & Smuts Other (See Comments)   Unknown   Seasonal Ic [cholestatin] Other (See Comments)   Environmental Allergies      Medication List       Accurate as of July 22, 2019  5:16 PM. If you have any questions, ask your nurse or doctor.        ascorbic acid 1000 MG tablet Commonly known as: VITAMIN C Take 1,000 mg by mouth daily.   atorvastatin 40 MG tablet Commonly known as: LIPITOR Take 1 tablet (40 mg total) by mouth daily.   Azelastine-Fluticasone 137-50 MCG/ACT Susp Commonly known as: Dymista Use 1 spray per nostril twice daily as needed   CALCIUM 1200 PO Take 1 tablet by mouth daily.   celecoxib 100 MG capsule Commonly known as: CELEBREX TAKE 1 CAPSULE(100 MG) BY MOUTH TWICE DAILY   DHEA 25 MG Caps Take by mouth.   EpiPen 2-Pak 0.3 mg/0.3 mL Soaj injection Generic drug: EPINEPHrine Inject 0.3 mg into the muscle once.   FISH OIL ADULT GUMMIES PO Take by mouth.   fluticasone 50 MCG/ACT nasal spray Commonly known as: FLONASE SHAKE LIQUID AND USE 2 SPRAYS IN EACH NOSTRIL DAILY   Fluticasone-Umeclidin-Vilant 100-62.5-25 MCG/INH Aepb Commonly known as:  Trelegy Ellipta Inhale 1 puff into the lungs daily.   gabapentin 300 MG capsule Commonly known as: NEURONTIN TAKE 2 CAPSULES BY MOUTH IN MORNING, 1 CAPSULE AT MIDDAY AND 2 CAPSULES BY MOUTH AT BEDTIME   glucosamine-chondroitin 500-400 MG tablet Take 1 tablet by mouth 2 (two) times daily.   Magnesium 250 MG Tabs Take by mouth.   metoprolol succinate 25 MG 24 hr tablet Commonly known as: TOPROL-XL Take 1 tablet (25 mg total) by mouth daily. PATIENT NEEDS TO CALL AND SCHEDULE AN APPOINTMENT FOR FURTHER REFILLS 1ST ATTEMPT    montelukast 10 MG tablet Commonly known as: SINGULAIR TAKE 1 TABLET(10 MG) BY MOUTH AT BEDTIME. NEED TO MAKE OFFICE VISIT   multivitamin tablet Take 1 tablet by mouth 2 (two) times daily.   nitroGLYCERIN 0.4 MG SL tablet Commonly known as: NITROSTAT Place 1 tablet (0.4 mg total) under the tongue every 5 (five) minutes as needed for chest pain.   traMADol 50 MG tablet Commonly known as: ULTRAM Take 1 tablet (50 mg total) by mouth 2 (two) times daily.   vitamin B-12 1000 MCG tablet Commonly known as: CYANOCOBALAMIN Take 1,000 mcg by mouth daily.   Vitamin D3 50 MCG (2000 UT) Tabs Take 2,000 Units by mouth 2 (two) times daily.   vitamin E 400 UNIT capsule Take 400 Units by mouth daily.       Known medication allergies: Allergies  Allergen Reactions  . Bee Venom Swelling  . Linzess [Linaclotide] Other (See Comments)    Abdominal pain  . Molds & Smuts Other (See Comments)    Unknown  . Seasonal Ic [Cholestatin] Other (See Comments)    Environmental Allergies     Physical examination: Blood pressure 124/76, pulse 62, temperature 97.7 F (36.5 C), resp. rate 16, SpO2 96 %.  General: Alert, interactive, in no acute distress. HEENT: PERRLA, TMs pearly gray, turbinates minimally edematous without discharge, post-pharynx non erythematous. Neck: Supple without lymphadenopathy. Lungs: Clear to auscultation without wheezing, rhonchi or rales. {no increased work of breathing. CV: Normal S1, S2 without murmurs. Abdomen: Nondistended, nontender. Skin: Warm and dry, without lesions or rashes. Extremities:  No clubbing, cyanosis or edema. Neuro:   Grossly intact.  Diagnositics/Labs: None today  Assessment and plan: Allergic rhinitis  - continue allergen immunotherapy (allergy shots) per protocol - continue singulair 10mg  daily  - continue Dymista 1 spray each nostril twice a day as needed for congestion with drainage.  Can use Flonase alone if only having congestion   Asthma - continue you routine medications especially Trelegy and as needed albuterol and follow-up with Dr. Annamaria Boots for your asthma care and other specialists.    Follow-up 12 months or sooner if needed  I appreciate the opportunity to take part in Markian's care. Please do not hesitate to contact me with questions.  Sincerely,   Prudy Feeler, MD Allergy/Immunology [facility] [place] [facility] [place]

## 2019-07-24 IMAGING — DX DG CHEST 1V PORT
1 series · 1 of 1 positions shown · non-contrast
Comparison: 08/21/2015

CLINICAL DATA: Shortness of breath

EXAM:
PORTABLE CHEST 1 VIEW

[chest ap]
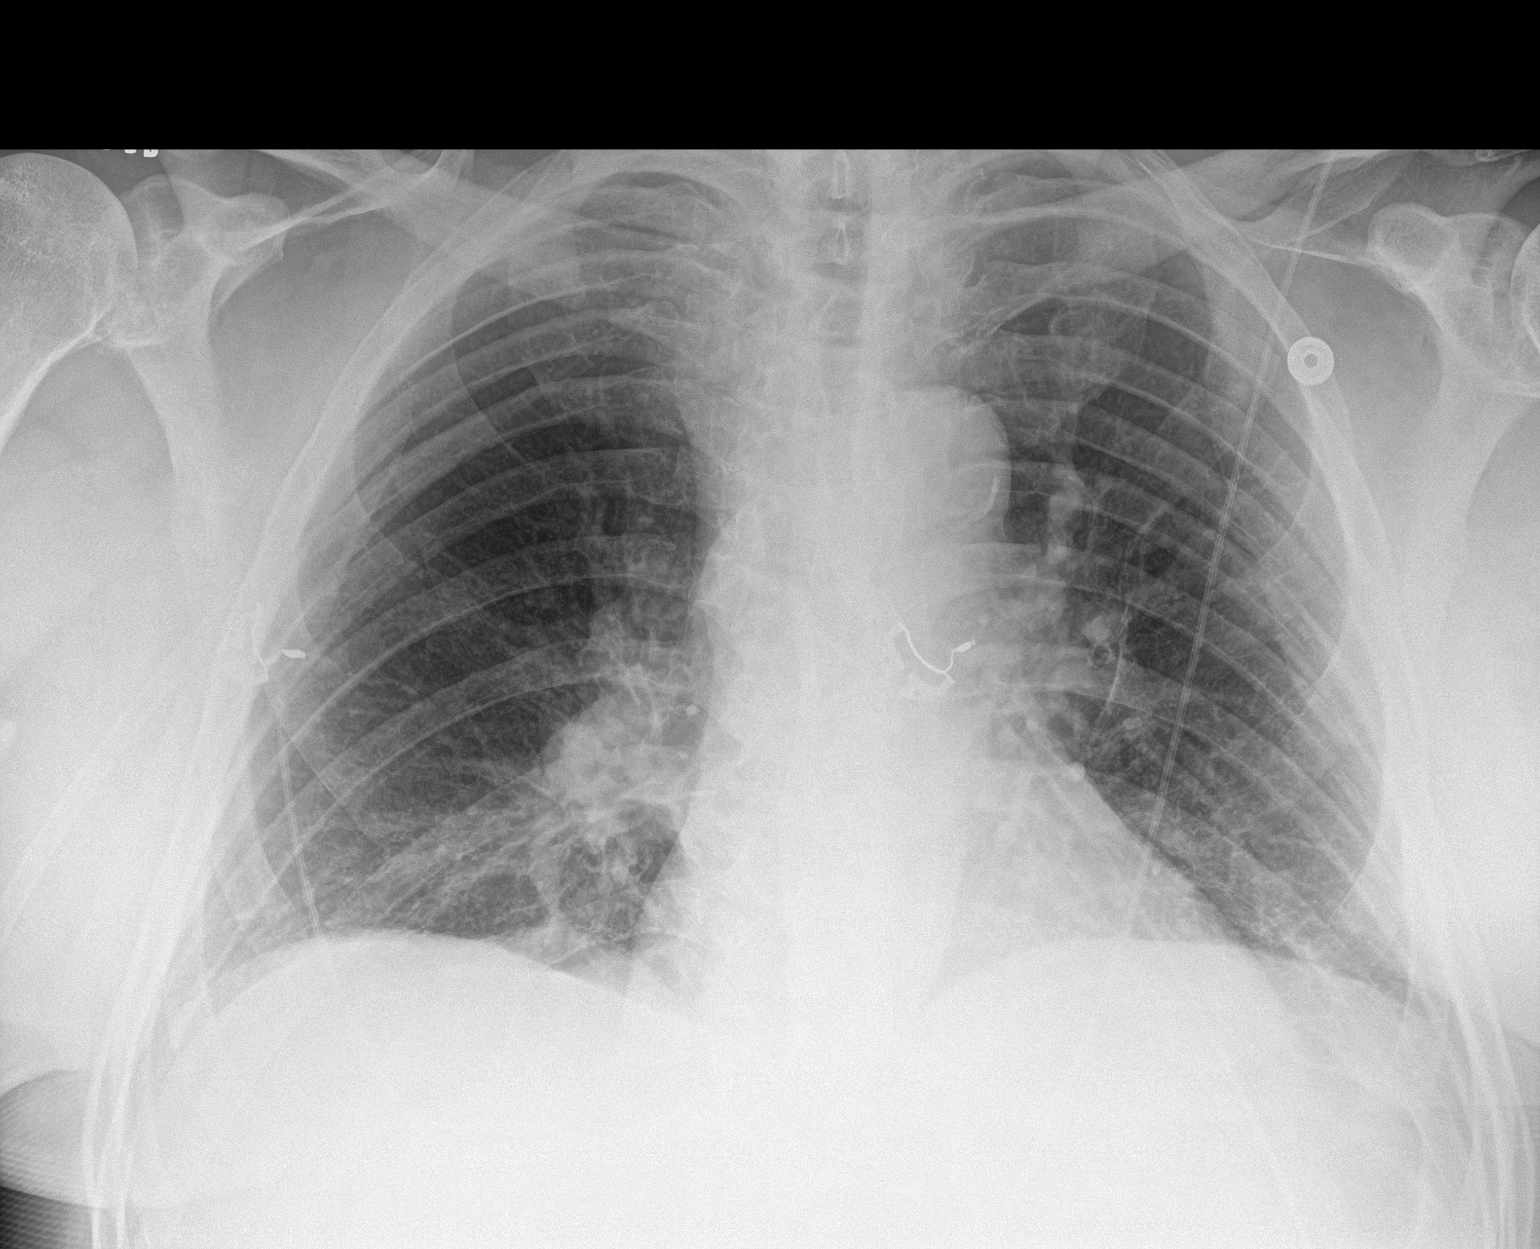

[1 of 1 positions shown; findings below may reference images not displayed]

FINDINGS: Shallow inspiration. Mild cardiac enlargement. No vascular
congestion or edema. Mild right hilar prominence is likely vascular.
No blunting of costophrenic angles. No pneumothorax. Calcification
of the aorta.
IMPRESSION: Shallow inspiration. Cardiac enlargement. No evidence of active
pulmonary disease.

## 2019-07-28 ENCOUNTER — Ambulatory Visit (INDEPENDENT_AMBULATORY_CARE_PROVIDER_SITE_OTHER): Payer: Medicare Other | Admitting: *Deleted

## 2019-07-28 DIAGNOSIS — J309 Allergic rhinitis, unspecified: Secondary | ICD-10-CM

## 2019-08-04 ENCOUNTER — Ambulatory Visit (INDEPENDENT_AMBULATORY_CARE_PROVIDER_SITE_OTHER): Payer: Medicare Other | Admitting: *Deleted

## 2019-08-04 DIAGNOSIS — J309 Allergic rhinitis, unspecified: Secondary | ICD-10-CM | POA: Diagnosis not present

## 2019-08-09 NOTE — Progress Notes (Signed)
Created in Error

## 2019-08-11 ENCOUNTER — Ambulatory Visit (INDEPENDENT_AMBULATORY_CARE_PROVIDER_SITE_OTHER): Payer: Medicare Other

## 2019-08-11 ENCOUNTER — Other Ambulatory Visit: Payer: Self-pay

## 2019-08-11 DIAGNOSIS — J309 Allergic rhinitis, unspecified: Secondary | ICD-10-CM

## 2019-08-11 MED ORDER — METOPROLOL SUCCINATE ER 25 MG PO TB24: 25.0000 mg | ORAL_TABLET | Freq: Every day | ORAL | 0 refills | Status: DC

## 2019-08-16 ENCOUNTER — Telehealth: Payer: Self-pay | Admitting: Allergy

## 2019-08-16 NOTE — Telephone Encounter (Signed)
Patient called stating he needed a letter from Dr. Nelva Bush stating that he has a physical condition (COPD) so he will be able to go to a control environment gym. Patient states he has COPD and knows a person that owns a gym where only people with physical conditions can get in with a doctors note.    Patient states he wants to continue working out physically, but wants to work out in a controlled environment.    Patient states that any form of letter would work for admission to this gym.   Please advise.

## 2019-08-16 NOTE — Telephone Encounter (Signed)
Please make note as follows--       To whom it may concern:  ::insert pt name:: is under my care for asthma and allergies at [facility].  Please allow for ::insert pt name:: to continue his work-out regimen in a controlled environment to maintain a conditioned state.     Sincerely,     Prudy Feeler, MD [facility]

## 2019-08-16 NOTE — Telephone Encounter (Signed)
Dr Padgett please advise 

## 2019-08-17 ENCOUNTER — Telehealth: Payer: Self-pay | Admitting: Internal Medicine

## 2019-08-17 NOTE — Telephone Encounter (Signed)
Letter printed and placed at front desk for pickup.  Patient notified and verbalized appreciation.

## 2019-08-17 NOTE — Telephone Encounter (Signed)
Letter generated and patient notified. Patient will pick up letter in the clinic when he comes in tomorrow.

## 2019-08-17 NOTE — Telephone Encounter (Signed)
Returned call to patient of Dr. Debara Pickett. He reports his gym reopened recently after being closed since March. The notes needs to state that "patient needs to stay physically active due to his medical condition". He would be going to a gym that is specific for persons with medical conditions, higher risk, in a larger area.   Patient needs 2 copies and would like to pick up at the office

## 2019-08-17 NOTE — Telephone Encounter (Signed)
pt states that he has a gym that he can go to that only has 2-3 people at a time - people that need to be away from crowds. If Dr. Annamaria Boots can write him a note stating that he needs to be active, they will let him go to this gym. He does not need to release his medical condition - CB# [phone]

## 2019-08-17 NOTE — Telephone Encounter (Signed)
Call returned to patient, he is requesting a letter from [facility] stating that it is okay for him to go to gym. He reports the letter does not need to state his medical condition just needs to state it is okay or in his medical opinion the patient would benefit from returning to the gym. I made patient aware since it has almost been a year he may require a OV.   CY please advise. Thanks.

## 2019-08-17 NOTE — Progress Notes (Signed)
Vial exp 08-16-20

## 2019-08-17 NOTE — Telephone Encounter (Signed)
New message:    Patient calling stating that he needs a note to work out. Please call patient back.

## 2019-08-18 ENCOUNTER — Encounter: Payer: Self-pay | Admitting: Internal Medicine

## 2019-08-18 ENCOUNTER — Ambulatory Visit (INDEPENDENT_AMBULATORY_CARE_PROVIDER_SITE_OTHER): Payer: Medicare Other | Admitting: *Deleted

## 2019-08-18 ENCOUNTER — Telehealth: Payer: Self-pay | Admitting: *Deleted

## 2019-08-18 DIAGNOSIS — J309 Allergic rhinitis, unspecified: Secondary | ICD-10-CM | POA: Diagnosis not present

## 2019-08-18 NOTE — Telephone Encounter (Signed)
I can do this - it will be tomorrow

## 2019-08-18 NOTE — Telephone Encounter (Signed)
Patient called and stated he did not receive a call yesterday. Patient reported he needs a letter that states, "Patient is reccomended to stay active due to medical condition and is under Dr. Janee Morn care." This letter will be so that he can go to a gym with decreased number of people for safety due to covid.   Patient would like to be able to pick the letter up.

## 2019-08-18 NOTE — Telephone Encounter (Signed)
Done

## 2019-08-18 NOTE — Telephone Encounter (Signed)
Patient left a message asking if Dr. Letta Pate would refer him to  Outpatient physical therapy. No reason specified

## 2019-08-18 NOTE — Telephone Encounter (Signed)
ATC pt, line went to voicemail. LMTCB x1.  I have the printed/signed letter ready in an envelope for pt to pick up. Will try to call back later and/or await his call back before placing up front for pick up.

## 2019-08-19 DIAGNOSIS — J3089 Other allergic rhinitis: Secondary | ICD-10-CM

## 2019-08-19 NOTE — Telephone Encounter (Signed)
ATC Patient.  LM on VM (DPR), letter has been completed by Dr. Annamaria Boots, and to return call before pick up.

## 2019-08-19 NOTE — Telephone Encounter (Signed)
Pt came to the office today and picked up letter completed by CY. Nothing further needed at this time.

## 2019-08-22 NOTE — Telephone Encounter (Signed)
Pt will need to discuss this with Zella Ball at next visit, needs a diagnosis and reason for therapy

## 2019-08-22 NOTE — Telephone Encounter (Signed)
Bruce,  I need you to call Mr. Tomaso regarding Dr. Sherri Sear, and have him schedule an appointment.

## 2019-08-23 NOTE — Telephone Encounter (Signed)
Left message with patient to contact our office to make an appointment

## 2019-08-26 ENCOUNTER — Ambulatory Visit (INDEPENDENT_AMBULATORY_CARE_PROVIDER_SITE_OTHER): Payer: Medicare Other

## 2019-08-26 DIAGNOSIS — J309 Allergic rhinitis, unspecified: Secondary | ICD-10-CM | POA: Diagnosis not present

## 2019-09-02 ENCOUNTER — Ambulatory Visit (INDEPENDENT_AMBULATORY_CARE_PROVIDER_SITE_OTHER): Payer: Medicare Other | Admitting: *Deleted

## 2019-09-02 DIAGNOSIS — J309 Allergic rhinitis, unspecified: Secondary | ICD-10-CM | POA: Diagnosis not present

## 2019-09-08 ENCOUNTER — Ambulatory Visit (INDEPENDENT_AMBULATORY_CARE_PROVIDER_SITE_OTHER): Payer: Medicare Other | Admitting: *Deleted

## 2019-09-08 DIAGNOSIS — J309 Allergic rhinitis, unspecified: Secondary | ICD-10-CM | POA: Diagnosis not present

## 2019-09-13 ENCOUNTER — Telehealth: Payer: Self-pay | Admitting: Family Medicine

## 2019-09-13 NOTE — Telephone Encounter (Signed)
Pt called and wants to get  his Hep C, Tetanus, and Pneumonia vaccines. Wanted to check to see if he needed orders before I  Scheduled pt.

## 2019-09-13 NOTE — Telephone Encounter (Signed)
Schedule him to come in and I will go over all this with him and draw blood for the hepatitis C

## 2019-09-14 NOTE — Telephone Encounter (Signed)
appt made. KH 

## 2019-09-16 ENCOUNTER — Other Ambulatory Visit: Payer: Self-pay

## 2019-09-16 ENCOUNTER — Encounter: Payer: Self-pay | Admitting: Registered Nurse

## 2019-09-16 ENCOUNTER — Ambulatory Visit: Payer: Medicare Other | Admitting: Registered Nurse

## 2019-09-16 ENCOUNTER — Ambulatory Visit (INDEPENDENT_AMBULATORY_CARE_PROVIDER_SITE_OTHER): Payer: Medicare Other

## 2019-09-16 ENCOUNTER — Encounter: Payer: Medicare Other | Attending: Registered Nurse | Admitting: Registered Nurse

## 2019-09-16 VITALS — BP 147/81 | HR 67 | Temp 97.7°F | Ht 74.0 in | Wt 264.4 lb

## 2019-09-16 DIAGNOSIS — M174 Other bilateral secondary osteoarthritis of knee: Secondary | ICD-10-CM | POA: Diagnosis present

## 2019-09-16 DIAGNOSIS — M542 Cervicalgia: Secondary | ICD-10-CM | POA: Diagnosis not present

## 2019-09-16 DIAGNOSIS — Z79891 Long term (current) use of opiate analgesic: Secondary | ICD-10-CM | POA: Diagnosis not present

## 2019-09-16 DIAGNOSIS — M25511 Pain in right shoulder: Secondary | ICD-10-CM | POA: Diagnosis present

## 2019-09-16 DIAGNOSIS — Z5181 Encounter for therapeutic drug level monitoring: Secondary | ICD-10-CM

## 2019-09-16 DIAGNOSIS — G894 Chronic pain syndrome: Secondary | ICD-10-CM

## 2019-09-16 DIAGNOSIS — M79641 Pain in right hand: Secondary | ICD-10-CM | POA: Diagnosis present

## 2019-09-16 DIAGNOSIS — M25521 Pain in right elbow: Secondary | ICD-10-CM | POA: Diagnosis present

## 2019-09-16 DIAGNOSIS — M47817 Spondylosis without myelopathy or radiculopathy, lumbosacral region: Secondary | ICD-10-CM

## 2019-09-16 DIAGNOSIS — J309 Allergic rhinitis, unspecified: Secondary | ICD-10-CM | POA: Diagnosis not present

## 2019-09-16 DIAGNOSIS — G8929 Other chronic pain: Secondary | ICD-10-CM

## 2019-09-16 MED ORDER — GABAPENTIN 300 MG PO CAPS: ORAL_CAPSULE | ORAL | 5 refills | Status: DC

## 2019-09-16 MED ORDER — CELECOXIB 100 MG PO CAPS: ORAL_CAPSULE | ORAL | 1 refills | Status: DC

## 2019-09-16 MED ORDER — TRAMADOL HCL 50 MG PO TABS: 50.0000 mg | ORAL_TABLET | Freq: Two times a day (BID) | ORAL | 5 refills | Status: DC
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
## 2019-09-19 ENCOUNTER — Ambulatory Visit: Payer: Medicare Other | Admitting: Family Medicine

## 2019-09-19 ENCOUNTER — Other Ambulatory Visit: Payer: Self-pay

## 2019-09-19 VITALS — BP 120/70 | HR 62 | Temp 98.2°F | Wt 260.2 lb

## 2019-09-19 DIAGNOSIS — Z1159 Encounter for screening for other viral diseases: Secondary | ICD-10-CM | POA: Diagnosis not present

## 2019-09-19 DIAGNOSIS — Z23 Encounter for immunization: Secondary | ICD-10-CM | POA: Diagnosis not present

## 2019-09-19 NOTE — Progress Notes (Signed)
   Subjective:    Patient ID: Kristopher Gay, male    DOB: Feb 07, 1951, 68 y.o.   MRN: [id number]  HPI He is here for consult concerning immunization updates.  Review of his record indicates need for Tdap, pneumonia, Shingrix.  He also needs follow-up on hepatitis C.   Review of Systems     Objective:   Physical Exam Alert and in no distress otherwise not examined       Assessment & Plan:  Need for hepatitis C screening test - Plan: Hepatitis C antibody  Need for vaccination against Streptococcus pneumoniae - Plan: Pneumococcal conjugate vaccine 13-valent I explained hepatitis C screening to him and potential therapies.  I then discussed giving him the pneumonia shot. I explained that shingles and tetanus would be under part D of his Medicare plan and that he should go to the drugstore to get this taken care of.  He has apparently also had a colonoscopy within the last year or so.  He will be scheduled for complete exam.

## 2019-09-20 LAB — HEPATITIS C ANTIBODY: Hep C Virus Ab: <0.1 s/co ratio (ref 0.0–0.9)

## 2019-09-22 LAB — TOXASSURE SELECT,+ANTIDEPR,UR

## 2019-09-26 ENCOUNTER — Telehealth: Payer: Self-pay | Admitting: *Deleted

## 2019-09-26 NOTE — Telephone Encounter (Signed)
Urine drug screen for this encounter is consistent for prescribed medication being absent. Reported last dose 4-5 days before test.

## 2019-09-28 ENCOUNTER — Ambulatory Visit (INDEPENDENT_AMBULATORY_CARE_PROVIDER_SITE_OTHER): Payer: Medicare Other

## 2019-09-28 DIAGNOSIS — J309 Allergic rhinitis, unspecified: Secondary | ICD-10-CM

## 2019-09-29 ENCOUNTER — Other Ambulatory Visit: Payer: Self-pay

## 2019-09-29 ENCOUNTER — Encounter: Payer: Self-pay | Admitting: Internal Medicine

## 2019-09-29 ENCOUNTER — Ambulatory Visit (INDEPENDENT_AMBULATORY_CARE_PROVIDER_SITE_OTHER): Payer: Medicare Other | Admitting: Internal Medicine

## 2019-09-29 DIAGNOSIS — G8929 Other chronic pain: Secondary | ICD-10-CM | POA: Diagnosis not present

## 2019-09-29 DIAGNOSIS — J45909 Unspecified asthma, uncomplicated: Secondary | ICD-10-CM

## 2019-09-29 DIAGNOSIS — M7918 Myalgia, other site: Secondary | ICD-10-CM

## 2019-09-29 DIAGNOSIS — G894 Chronic pain syndrome: Secondary | ICD-10-CM

## 2019-09-29 DIAGNOSIS — J449 Chronic obstructive pulmonary disease, unspecified: Secondary | ICD-10-CM

## 2019-09-29 NOTE — Progress Notes (Signed)
---------------  Patient ID: Kristopher Gay, male    DOB: Feb 15, 1951, 68 y.o.   MRN: [id number]  HPI  M never smoker followed for allergic rhinitis and allergic asthma/ bronchitis, complicated by chronic musculoskeletal pain after an injury, GERD/ reflux, AFib Barium Swallow 04/04/14.2 Moderate gastroesophageal reflux to the level of mid esophagus Allergy Profile 10/16/16- Neg-no elevation for local environmental allergens CT chest 01/11/2016-IMPRESSION: 1. 2.2 cm left adrenal adenoma.ower lobe  2. New bronchial wall thickening and nodular opacities in the right likely reflecting bronchiolitis Office Spirometry 09/23/2016-mild restriction of exhaled volume. FVC 3.75/71%, FEV1 3.01/76%, ratio 0.80, FEF 25-75% 2.96/95%. -------------------------------------------------------------------------------------------------------------------  09/27/2018- 68 year old male never smoker followed for Allergic rhinitis, allergic asthma/bronchitis, complicated by chronic musculoskeletal pain after injury, GERD/reflux, grade 1 diastolic dysfunction, degenerative disc disease, AFib/ Eliquis -----pt states he is doing well, notes stable sob with exertion.  Dymista nasal spray, Wixela 500, Singulair He describes multiple orthopedic problems but denies recent respiratory issues or exacerbations.  [facility] and they fill some of his respiratory meds.  He did not want to stop coming to this office. CXR 03/03/2018 IMPRESSION: Linear opacities in the lung bases compatible with minor atelectasis. Aortic atherosclerosis.  09/29/2019- Virtual Visit via Telephone Note  I connected with Kristopher Gay on 09/29/19 at  4:00 PM EST by telephone and verified that I am speaking with the correct person using two identifiers.  Location: Patient: H Provider: O   I discussed the limitations, risks, security and privacy concerns of performing an evaluation and management service by telephone and the  availability of in person appointments. I also discussed with the patient that there may be a patient responsible charge related to this service. The patient expressed understanding and agreed to proceed.   History of Present Illness:  68 year old male never smoker followed for Allergic rhinitis, allergic asthma/bronchitis, complicated by chronic musculoskeletal pain after injury, GERD/reflux, grade 1 diastolic dysfunction, degenerative disc disease, AFib/ Eliquis  Trelegy, flonase, singulair, Dymista nasal spray, [facility]  Thinks Trelegy makes his throat sore, but says breathing/ asthma is ok now.  Pending more surgery- ulnar nerve repair. Chronic musculoskeletal pain.   Observations/Objective:   Assessment and Plan: Asthmatic bronchitis- try using Trelegy before meals to see if that helps the sore throat.  Chronic musculoskeletal pain- managed by pain clinic. Does affect sleep.   Follow Up Instructions: 1 year  I discussed the assessment and treatment plan with the patient. The patient was provided an opportunity to ask questions and all were answered. The patient agreed with the plan and demonstrated an understanding of the instructions.   The patient was advised to call back or seek an in-person evaluation if the symptoms worsen or if the condition fails to improve as anticipated.  I provided 16 minutes of non-face-to-face time during this encounter.   Baird Lyons, MD       ROS-see HPI             + = positive Constitutional:   No-   weight loss, night sweats, fevers, chills, fatigue, lassitude. HEENT:   No-  headaches, difficulty swallowing, tooth/dental problems, +sore throat,       sneezing, itching, ear ache,  nasal congestion, +post nasal drip,  CV:  chest pain, no-orthopnea, PND, swelling in lower extremities, anasarca,  dizziness, palpitations Resp: No-   shortness of breath with exertion or at rest.             +  productive cough,   + non-productive cough,  No- coughing up of blood.              No-   change in color of mucus.  +wheezing.   Skin: No-   rash or lesions. GI:  No-   heartburn, indigestion, abdominal pain, nausea, vomiting,  GU: . MS: +  joint pain or swelling.  No- decreased range of motion.  + back pain. Neuro-     nothing unusual Psych:  No- change in mood or affect. + depression or anxiety.  No memory loss.  OBJ- Physical Exam General- Alert, Oriented, Affect-appropriate, Distress- none acute. Big/ muscular Skin- rash-none, lesions- none, excoriation- none Lymphadenopathy- none Head- atraumatic            Eyes- Gross vision intact, PERRLA, conjunctivae and secretions clear            Ears- Hearing, canals-normal            Nose- Clear, no-Septal dev, mucus, polyps, erosion, perforation             Throat- Mallampati II , mucosa clear , drainage- none, tonsils- atrophic Neck- flexible , trachea midline, no stridor , thyroid nl, carotid no bruit Chest - symmetrical excursion , unlabored           Heart/CV- RRR , no murmur , no gallop  , no rub, nl s1 s2                           - JVD- none , edema- none, stasis changes- none, varices- none           Lung- clear, dullness-none, rub- none, cough-none           Chest wall- not obviously tender Abd-  Br/ Gen/ Rectal- Not done, not indicated Extrem- cyanosis- none, clubbing, none, atrophy- none, strength- muscular. + Brace L lower leg,  + cane Neuro- grossly intact to observation

## 2019-09-29 NOTE — Patient Instructions (Signed)
Ok to continue current meds and call us for refills as needed.  Please call if we can help

## 2019-10-06 ENCOUNTER — Ambulatory Visit (INDEPENDENT_AMBULATORY_CARE_PROVIDER_SITE_OTHER): Payer: Medicare Other

## 2019-10-06 DIAGNOSIS — J309 Allergic rhinitis, unspecified: Secondary | ICD-10-CM

## 2019-10-15 ENCOUNTER — Other Ambulatory Visit: Payer: Self-pay | Admitting: Allergy and Immunology

## 2019-10-19 ENCOUNTER — Ambulatory Visit (INDEPENDENT_AMBULATORY_CARE_PROVIDER_SITE_OTHER): Payer: Medicare Other | Admitting: *Deleted

## 2019-10-19 DIAGNOSIS — J309 Allergic rhinitis, unspecified: Secondary | ICD-10-CM

## 2019-10-26 ENCOUNTER — Ambulatory Visit (INDEPENDENT_AMBULATORY_CARE_PROVIDER_SITE_OTHER): Payer: Medicare Other | Admitting: *Deleted

## 2019-10-26 DIAGNOSIS — J309 Allergic rhinitis, unspecified: Secondary | ICD-10-CM

## 2019-11-02 ENCOUNTER — Ambulatory Visit (INDEPENDENT_AMBULATORY_CARE_PROVIDER_SITE_OTHER): Payer: Medicare PPO | Admitting: *Deleted

## 2019-11-02 DIAGNOSIS — J309 Allergic rhinitis, unspecified: Secondary | ICD-10-CM

## 2019-11-06 ENCOUNTER — Other Ambulatory Visit: Payer: Self-pay | Admitting: Internal Medicine

## 2019-11-08 ENCOUNTER — Other Ambulatory Visit: Payer: Self-pay

## 2019-11-08 MED ORDER — METOPROLOL SUCCINATE ER 25 MG PO TB24: 25.0000 mg | ORAL_TABLET | Freq: Every day | ORAL | 0 refills | Status: DC

## 2019-11-08 NOTE — Assessment & Plan Note (Signed)
Good control without recent exacerbation. Plan- try using Trelegy before meal, to see if swallowing prevents throat soreness he notes- ? Possible thrush?
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
## 2019-11-09 ENCOUNTER — Other Ambulatory Visit: Payer: Self-pay | Admitting: Internal Medicine

## 2019-11-09 ENCOUNTER — Other Ambulatory Visit: Payer: Self-pay | Admitting: Allergy and Immunology

## 2019-11-10 ENCOUNTER — Ambulatory Visit (INDEPENDENT_AMBULATORY_CARE_PROVIDER_SITE_OTHER): Payer: Medicare PPO

## 2019-11-10 DIAGNOSIS — J309 Allergic rhinitis, unspecified: Secondary | ICD-10-CM | POA: Diagnosis not present

## 2019-11-15 ENCOUNTER — Other Ambulatory Visit: Payer: Self-pay

## 2019-11-15 ENCOUNTER — Encounter: Payer: Self-pay | Admitting: Family Medicine

## 2019-11-15 ENCOUNTER — Ambulatory Visit: Payer: Medicare PPO | Admitting: Family Medicine

## 2019-11-15 VITALS — BP 120/80 | HR 65 | Temp 98.6°F | Ht 72.0 in | Wt 262.6 lb

## 2019-11-15 DIAGNOSIS — M549 Dorsalgia, unspecified: Secondary | ICD-10-CM

## 2019-11-15 DIAGNOSIS — M25511 Pain in right shoulder: Secondary | ICD-10-CM

## 2019-11-15 DIAGNOSIS — I480 Paroxysmal atrial fibrillation: Secondary | ICD-10-CM

## 2019-11-15 DIAGNOSIS — G5621 Lesion of ulnar nerve, right upper limb: Secondary | ICD-10-CM

## 2019-11-15 DIAGNOSIS — G894 Chronic pain syndrome: Secondary | ICD-10-CM

## 2019-11-15 DIAGNOSIS — G8929 Other chronic pain: Secondary | ICD-10-CM

## 2019-11-15 DIAGNOSIS — K219 Gastro-esophageal reflux disease without esophagitis: Secondary | ICD-10-CM

## 2019-11-15 DIAGNOSIS — M47817 Spondylosis without myelopathy or radiculopathy, lumbosacral region: Secondary | ICD-10-CM

## 2019-11-15 DIAGNOSIS — J302 Other seasonal allergic rhinitis: Secondary | ICD-10-CM

## 2019-11-15 DIAGNOSIS — Z0000 Encounter for general adult medical examination without abnormal findings: Secondary | ICD-10-CM | POA: Diagnosis not present

## 2019-11-15 DIAGNOSIS — J449 Chronic obstructive pulmonary disease, unspecified: Secondary | ICD-10-CM

## 2019-11-15 DIAGNOSIS — J3089 Other allergic rhinitis: Secondary | ICD-10-CM

## 2019-11-15 DIAGNOSIS — E669 Obesity, unspecified: Secondary | ICD-10-CM

## 2019-11-15 LAB — POCT URINALYSIS DIP (PROADVANTAGE DEVICE)
Bilirubin, UA: NEGATIVE
Blood, UA: NEGATIVE
Glucose, UA: NEGATIVE mg/dL
Ketones, POC UA: NEGATIVE mg/dL
Leukocytes, UA: NEGATIVE
Nitrite, UA: NEGATIVE
Protein Ur, POC: NEGATIVE mg/dL
Specific Gravity, Urine: 1.02
Urobilinogen, Ur: 0.2
pH, UA: 6.5 (ref 5.0–8.0)

## 2019-11-15 NOTE — Progress Notes (Signed)
Kristopher Gay is a 69 y.o. male who presents for annual wellness visit.,PE and follow-up on chronic medical conditions.  He has chronic pain syndrome involving neck, shoulders, back, elbows.  He is followed by orthopedics for this.  He is also involved with chronic pain management.  He does have a ulnar neuropathy that is still giving him trouble.  He does use a cane for mobility he does have a history of allergic rhinitis and presently is getting desensitization shots which seems to be going well.  He has PAF and has been seen by cardiology.  Presently he is not on any medications.  His reflux seems to be under good control.  He does have underlying COPD and is on Trelegy.  He finds this very useful.  He follows up with pulmonary concerning this.  Immunizations and Health Maintenance Immunization History  Administered Date(s) Administered  . H1N1 10/06/2008  . Influenza Split 08/08/2011, 09/24/2012, 08/27/2016  . Influenza Whole 07/30/2009, 08/16/2010  . Influenza, High Dose Seasonal PF 09/05/2016, 09/03/2018  . Influenza,inj,Quad PF,6+ Mos 08/05/2013, 07/31/2014, 07/27/2015, 08/18/2017, 07/12/2019  . Pneumococcal Conjugate-13 09/19/2019  . Tdap 09/20/2019  . Zoster Recombinat (Shingrix) 09/20/2019   There are no preventive care reminders to display for this patient.  Last colonoscopy: 06/16/17  Last PSA: unknown  Dentist: over two years Ophtho: 10-24-19 Exercise: QD   Other doctors caring for patient include: Dr. Gladstone Lighter, Dr. Debara Pickett  Cardio, Dr. Nelva Bush Allergy Dr Apolonio Schneiders   Dr Lynden Ang Advanced Directives: not on file information given. Does Patient Have a Medical Advance Directive?: Yes Type of Advance Directive: [place] Does patient want to make changes to medical advance directive?: No - Patient declined Copy of [place] in Chart?: No - copy requested  Depression screen:  See questionnaire below.     Depression screen [facility] 2/9 11/15/2019  12/19/2015 12/17/2015 12/13/2015 12/13/2015  Decreased Interest 0 0 0 0 0  Down, Depressed, Hopeless 0 0 0 0 0  PHQ - 2 Score 0 0 0 0 0  Some recent data might be hidden    Fall Screen: See Questionaire below.   Fall Risk  11/15/2019 09/16/2019 03/18/2019 09/17/2018 04/08/2018  Falls in the past year? 1 0 1 1 Yes  Comment - - - last fall April 2019 -  Number falls in past yr: 1 - 1 0 1  Injury with Fall? 0 - 0 1 Yes  Comment - - - elbow, neck, shoulder -  Risk Factor Category  - - - - High Fall Risk  Risk for fall due to : Impaired balance/gait - Impaired mobility;Impaired balance/gait;History of fall(s) - -  Follow up - - Falls prevention discussed - -    ADL screen:  See questionnaire below.  Functional Status Survey: Is the patient deaf or have difficulty hearing?: No Does the patient have difficulty seeing, even when wearing glasses/contacts?: No Does the patient have difficulty concentrating, remembering, or making decisions?: No Does the patient have difficulty walking or climbing stairs?: Yes(walks with cain) Does the patient have difficulty dressing or bathing?: No Does the patient have difficulty doing errands alone such as visiting a doctor's office or shopping?: Yes(has to prepare)   Review of Systems  Constitutional: -, -unexpected weight change, -anorexia, -fatigue Cardiology:  -chest pain, -palpitations, -orthopnea, Respiratory: -cough, -shortness of breath, -dyspnea on exertion, -wheezing,  Gastroenterology: -abdominal pain, -nausea, -vomiting, -diarrhea, -constipation, -dysphagia Hematology: -bleeding or bruising problems Ophthalmology: -vision changes,  Urology: -dysuria, -difficulty  urinating,  -urinary frequency, -urgency, incontinence Neurology: -, -numbness, , -memory loss, -falls, -dizziness    PHYSICAL EXAM:  BP 120/80 (BP Location: Left Arm, Patient Position: Sitting)   Pulse 65   Temp 98.6 F (37 C)   Ht 6' (1.829 m)   Wt 262 lb 9.6 oz (119.1 kg)    SpO2 96%   BMI 35.61 kg/m   General Appearance: Alert, cooperative, no distress, appears stated age Head: Normocephalic, without obvious abnormality, atraumatic Eyes: PERRL, conjunctiva/corneas clear, EOM's intact, fundi benign Ears: Normal TM's and external ear canals  Throat: Lips, mucosa, and tongue normal; teeth and gums normal Neck: Supple, no lymphadenopathy, thyroid:no enlargement/tenderness/nodules; no carotid bruit or JVD Lungs: Clear to auscultation bilaterally without wheezes, rales or ronchi; respirations unlabored Heart: Regular rate and rhythm, S1 and S2 normal, no murmur, rub or gallop Extremities: No clubbing, cyanosis or edema  Skin: Skin color, texture, turgor normal, no rashes or lesions Lymph nodes: Cervical, supraclavicular, and axillary nodes normal Neurologic: CNII-XII intact, normal strength, sensation and gait; reflexes 2+ and symmetric throughout   Psych: Normal mood, affect, hygiene and grooming  ASSESSMENT/PLAN: Routine general medical examination at a health care facility  Chronic back pain, unspecified back location, unspecified back pain laterality  Chronic pain syndrome  Gastroesophageal reflux disease, unspecified whether esophagitis present  Spondylosis of lumbosacral region, unspecified spinal osteoarthritis complication status  PAF (paroxysmal atrial fibrillation) (HCC)  Seasonal and perennial allergic rhinitis  Ulnar neuropathy at elbow, right  Chronic right shoulder pain  Chronic obstructive pulmonary disease, unspecified COPD type (HCC)  Obesity (BMI 30-39.9) Discussed the various concerns and complaints that he has.  Most of them are ongoing chronic pain related problems.  Encouraged him to continue with his present group of physicians.  He discussed need trouble and possibly getting injections.  I recommended that he follow-up with the pain management clinic rather than involving another group of physicians.  He is to continue on his  present medications.  He will get his next Shingrix shot within the next month or so.  Continue with allergy desensitization.  This is a very complicated individual with lots of orthopedic/pain management issues as well as neuropathies. Discussed diet and exercise with him.     Medicare Attestation I have personally reviewed: The patient's medical and social history Their use of alcohol, tobacco or illicit drugs Their current medications and supplements The patient's functional ability including ADLs,fall risks, home safety risks, cognitive, and hearing and visual impairment Diet and physical activities Evidence for depression or mood disorders  The patient's weight, height, and BMI have been recorded in the chart.  I have made referrals, counseling, and provided education to the patient based on review of the above and I have provided the patient with a written personalized care plan for preventive services.     Jill Alexanders, MD   11/15/2019

## 2019-11-16 ENCOUNTER — Encounter: Payer: Self-pay | Admitting: Family Medicine

## 2019-11-16 ENCOUNTER — Ambulatory Visit (INDEPENDENT_AMBULATORY_CARE_PROVIDER_SITE_OTHER): Payer: Medicare PPO

## 2019-11-16 DIAGNOSIS — J309 Allergic rhinitis, unspecified: Secondary | ICD-10-CM | POA: Diagnosis not present

## 2019-11-21 DIAGNOSIS — J3089 Other allergic rhinitis: Secondary | ICD-10-CM

## 2019-11-21 NOTE — Progress Notes (Signed)
VIAL EXP 11-20-20

## 2019-11-25 ENCOUNTER — Ambulatory Visit (INDEPENDENT_AMBULATORY_CARE_PROVIDER_SITE_OTHER): Payer: Medicare PPO | Admitting: *Deleted

## 2019-11-25 DIAGNOSIS — J309 Allergic rhinitis, unspecified: Secondary | ICD-10-CM | POA: Diagnosis not present

## 2019-12-02 ENCOUNTER — Ambulatory Visit (INDEPENDENT_AMBULATORY_CARE_PROVIDER_SITE_OTHER): Payer: Medicare PPO | Admitting: *Deleted

## 2019-12-02 DIAGNOSIS — J309 Allergic rhinitis, unspecified: Secondary | ICD-10-CM | POA: Diagnosis not present

## 2019-12-08 ENCOUNTER — Ambulatory Visit (INDEPENDENT_AMBULATORY_CARE_PROVIDER_SITE_OTHER): Payer: Medicare PPO

## 2019-12-08 DIAGNOSIS — J309 Allergic rhinitis, unspecified: Secondary | ICD-10-CM | POA: Diagnosis not present

## 2019-12-14 ENCOUNTER — Ambulatory Visit (INDEPENDENT_AMBULATORY_CARE_PROVIDER_SITE_OTHER): Payer: Medicare PPO

## 2019-12-14 ENCOUNTER — Other Ambulatory Visit: Payer: Self-pay | Admitting: Physical Medicine & Rehabilitation

## 2019-12-14 DIAGNOSIS — J309 Allergic rhinitis, unspecified: Secondary | ICD-10-CM

## 2019-12-22 ENCOUNTER — Ambulatory Visit (INDEPENDENT_AMBULATORY_CARE_PROVIDER_SITE_OTHER): Payer: Medicare PPO

## 2019-12-22 DIAGNOSIS — J309 Allergic rhinitis, unspecified: Secondary | ICD-10-CM | POA: Diagnosis not present

## 2019-12-25 ENCOUNTER — Ambulatory Visit: Payer: Medicare PPO | Attending: Internal Medicine

## 2019-12-25 DIAGNOSIS — Z23 Encounter for immunization: Secondary | ICD-10-CM | POA: Insufficient documentation

## 2019-12-25 NOTE — Progress Notes (Signed)
   Covid-19 Vaccination Clinic  Name:  Kristopher Gay    MRN: [id number] DOB: 25-Jun-1951  12/25/2019  Kristopher Gay was observed post Covid-19 immunization for 15 minutes without incidence. He was provided with Vaccine Information Sheet and instruction to access the V-Safe system.   Kristopher Gay was instructed to call 911 with any severe reactions post vaccine: Marland Kitchen Difficulty breathing  . Swelling of your face and throat  . A fast heartbeat  . A bad rash all over your body  . Dizziness and weakness    Immunizations Administered    Name Date Dose VIS Date Route   Pfizer COVID-19 Vaccine 12/25/2019 11:41 AM 0.3 mL 10/07/2019 Intramuscular   Manufacturer: [place]   Lot: [id number]   [place]: [id number]

## 2019-12-30 ENCOUNTER — Ambulatory Visit (INDEPENDENT_AMBULATORY_CARE_PROVIDER_SITE_OTHER): Payer: Medicare PPO

## 2019-12-30 DIAGNOSIS — J309 Allergic rhinitis, unspecified: Secondary | ICD-10-CM | POA: Diagnosis not present

## 2020-01-06 ENCOUNTER — Ambulatory Visit (INDEPENDENT_AMBULATORY_CARE_PROVIDER_SITE_OTHER): Payer: Medicare PPO

## 2020-01-06 DIAGNOSIS — J309 Allergic rhinitis, unspecified: Secondary | ICD-10-CM

## 2020-01-13 ENCOUNTER — Ambulatory Visit (INDEPENDENT_AMBULATORY_CARE_PROVIDER_SITE_OTHER): Payer: Medicare PPO

## 2020-01-13 DIAGNOSIS — J309 Allergic rhinitis, unspecified: Secondary | ICD-10-CM

## 2020-01-19 ENCOUNTER — Ambulatory Visit (INDEPENDENT_AMBULATORY_CARE_PROVIDER_SITE_OTHER): Payer: Medicare PPO

## 2020-01-19 DIAGNOSIS — J309 Allergic rhinitis, unspecified: Secondary | ICD-10-CM | POA: Diagnosis not present

## 2020-01-22 NOTE — Progress Notes (Signed)
Cardiology Office Note   Date:  01/25/2020   ID:  Kristopher Gay, DOB 12/06/1950, MRN [id number]  PCP:  [name], MD  Cardiologist:  [name], MD EP: None  Chief Complaint  Patient presents with  . Follow-up    chest pain, Afib      History of Present Illness: Kristopher Gay is a 69 y.o. male with a PMH of mild non-obstructive CAD, paroxysmal atrial fibrillation, HLD, and COPD, who presents for routine follow-up of his chest pain atrial fibrillation.  He was last evaluated by cardiology at an outpatient visit with myself 07/06/2019, at which time he reported some exertional chest pain, for which he underwent a coronary CTA which showed a calcium score of 8, placing him in the 22nd percentile for age/sex, with mild non-obstructive disease in the LAD, otherwise normal coronaries. He was recommended to start aspirin 81mg  daily, in addition to recently started atorvastatin for risk factor modifications. His atrial fibrillation was well controlled at that time, though anticoagulation had not yet been started due to low CHA2DS2-VASc score.   He presents today for routine follow-up. He has been doing fairly well from a cardiac standpoint since his last visit. He had one brief episode of chest discomfort which he attributed to indigestion. No exertional chest pain or SOB. He has continue to go to the gym where he lifts weights and walks. He continues to have MSK pain and neuropathy. He reports some unsteadiness on his feet but no falls. He ambulates with a cane. He denies dizziness, lightheadedness, syncope, palpitations, racing heart rates, SOB, or LE edema.    Past Medical History:  Diagnosis Date  . A-fib ([place])   . Allergic rhinitis, cause unspecified   . Allergy   . Anxiety   . Blood clot in vein 2002   left calf SUPERFICIAL CLOT REMOVED THEN PART OF VEIN REMOVED  . Chronic headaches   . COPD (chronic obstructive pulmonary disease) ([place])   . Cough    X 1 YEAR NO FEVER  CLEAR SPUTUM  . DDD (degenerative disc disease)   . Depression   . DJD (degenerative joint disease)    ALL THE WAY DOWN SPINE  . Dropfoot    LEFT , NO BRACE WORN NOW  . Emphysema of lung ([place])   . Extrinsic asthma, unspecified   . GERD (gastroesophageal reflux disease)    OCC NO MEDS FOR  . Hypertension    STOPPED HTN MEDS UNTIL 2011, THEN HAD WEIGHT LOSS, NO MEDS SINCE  . Neuromuscular disorder ([place])   . Neuropathy    POLYNEUOPATHOPATHY FEET AND LEGS  . Peripheral vascular disease (HCC)    VARICOSE VEINS LEFT LEG  . Pneumonia AS CHILD  . PONV (postoperative nausea and vomiting)   . Syncope   . Ulnar nerve entrapment at elbow, right   . Umbilical hernia   . Unspecified asthma(493.90)     Past Surgical History:  Procedure Laterality Date  . COLONSCOPY  06/16/2017  . HERNIA REPAIR    . INGUINAL HERNIA REPAIR Bilateral 07/20/2017   Procedure: LAPAROSCOPIC BILATERAL INGUINAL HERNIA REPAIR WITH MESH, UMBILICAL HERNIA REPAIR AND LYSIS OF ADHESIONS;  Surgeon: [name], [name], MD;  Location: [facility];  Service: General;  Laterality: Bilateral;  . LAPAROSCOPY N/A 07/20/2017   Procedure: LAPAROSCOPY DIAGNOSTIC;  Surgeon: Kieth Brightly [name], MD;  Location: [facility];  Service: General;  Laterality: N/A;  . ROTATOR CUFF REPAIR Left 2005   detached; left shoulder-reattached  .  SHOULDER SURGERY Right    laBRAL  tendon torn  . SUPERFICIAL LEFT LEG VEIN STRIPPING WITH SMALL SUPERFICIAL CLOT  2002  . ULNAR NERVE TRANSPOSITION Right 05/10/2018   Procedure: RIGHT ELBOW ULNAR NERVE RELEASE;  Surgeon: Iran Planas, MD;  Location: [facility];  Service: Orthopedics;  Laterality: Right;  . UMBILICAL HERNIA REPAIR  02/18/2018  . UMBILICAL HERNIA REPAIR N/A 02/18/2018   Procedure: LAPAROSCOPIC UMBILICAL HERNIA REPAIR ERAS PATHWAY;  Surgeon: [name], [name], MD;  Location: [facility];  Service: General;  Laterality: N/A;     Current Outpatient Medications  Medication Sig Dispense Refill  .  ascorbic acid (VITAMIN C) 1000 MG tablet Take 1,000 mg by mouth daily.     Marland Kitchen aspirin EC 81 MG tablet Take 81 mg by mouth daily.    Marland Kitchen atorvastatin (LIPITOR) 40 MG tablet Take 1 tablet (40 mg total) by mouth daily. 30 tablet 6  . Calcium Carbonate-Vit D-Min (CALCIUM 1200 PO) Take 1 tablet by mouth daily.    . celecoxib (CELEBREX) 100 MG capsule Three month supply 180 capsule 1  . Cholecalciferol (VITAMIN D3) 2000 units TABS Take 2,000 Units by mouth 2 (two) times daily.     Marland Kitchen DHEA 25 MG CAPS Take by mouth.    . DYMISTA 137-50 MCG/ACT SUSP SHAKE LIQUID AND USE 1 SPRAY IN EACH NOSTRIL TWICE DAILY AS NEEDED 23 g 2  . EPINEPHrine (EPIPEN 2-PAK) 0.3 mg/0.3 mL SOAJ injection Inject 0.3 mg into the muscle once.     . fluticasone (FLONASE) 50 MCG/ACT nasal spray SHAKE LIQUID AND USE 2 SPRAYS IN EACH NOSTRIL DAILY 16 g 12  . gabapentin (NEURONTIN) 300 MG capsule TAKE 2 CAPSULE BY MOUTH IN THE MORNING, TAKE 1 CAPSULE AT MIDDAY AND 2 CAPSULE BY MOUTH AT BEDTIME 150 capsule 5  . glucosamine-chondroitin 500-400 MG tablet Take 1 tablet by mouth 2 (two) times daily.     . Magnesium 250 MG TABS Take by mouth.    . metoprolol succinate (TOPROL-XL) 25 MG 24 hr tablet Take 1 tablet (25 mg total) by mouth daily. 90 tablet 0  . montelukast (SINGULAIR) 10 MG tablet TAKE 1 TABLET(10 MG) BY MOUTH AT BEDTIME 90 tablet 0  . Multiple Vitamin (MULTIVITAMIN) tablet Take 1 tablet by mouth 2 (two) times daily.     . Omega-3 Fatty Acids (FISH OIL ADULT GUMMIES PO) Take by mouth.    . traMADol (ULTRAM) 50 MG tablet Take 1 tablet (50 mg total) by mouth 2 (two) times daily. 60 tablet 5  . TRELEGY ELLIPTA 100-62.5-25 MCG/INH AEPB INHALE 1 PUFF INTO THE LUNGS EVERY DAY 60 each 12  . vitamin B-12 (CYANOCOBALAMIN) 1000 MCG tablet Take 1,000 mcg by mouth daily.    . vitamin E 400 UNIT capsule Take 400 Units by mouth daily.    . nitroGLYCERIN (NITROSTAT) 0.4 MG SL tablet Place 1 tablet (0.4 mg total) under the tongue every 5 (five)  minutes as needed for chest pain. 25 tablet 3   No current facility-administered medications for this visit.    Allergies:   Bee venom, Linzess [linaclotide], Molds & smuts, and Seasonal ic [cholestatin]    Social History:  The patient  reports that he has never smoked. He has never used smokeless tobacco. He reports that he does not drink alcohol or use drugs.   Family History:  The patient's family history includes Breast cancer in his sister; Epilepsy in his brother; Pancreatic cancer in his mother.    ROS:  Please see the history of present illness.   Otherwise, review of systems are positive for none.   All other systems are reviewed and negative.    PHYSICAL EXAM: VS:  BP 118/72   Pulse (!) 55   Ht 6\' 2"  (1.88 m)   Wt 270 lb (122.5 kg)   SpO2 94%   BMI 34.67 kg/m  , BMI Body mass index is 34.67 kg/m. GEN: Well nourished, well developed, in no acute distress HEENT: sclera anicteric Neck: no JVD, carotid bruits, or masses Cardiac: RRR; no murmurs, rubs, or gallops, no edema  Respiratory:  clear to auscultation bilaterally, normal work of breathing GI: soft, obese, nontender, nondistended, + BS MS: no deformity or atrophy Skin: warm and dry, no rash Neuro:  Strength and sensation are intact Psych: euthymic mood, full affect   EKG:  EKG is ordered today. The ekg ordered today demonstrates sinus bradycardia, rate 55 bpm, no STE/D, no TWI, no significant change from previous.    Recent Labs: 07/08/2019: ALT 19; BUN 16; Creatinine, Ser 1.04; Potassium 4.6; Sodium 139    Lipid Panel    Component Value Date/Time   CHOL 184 07/08/2019 0824   TRIG 78 07/08/2019 0824   HDL 38 (L) 07/08/2019 0824   CHOLHDL 4.8 07/08/2019 0824   LDLCALC 131 (H) 07/08/2019 0824      Wt Readings from Last 3 Encounters:  01/25/20 270 lb (122.5 kg)  11/15/19 262 lb 9.6 oz (119.1 kg)  09/19/19 260 lb 3.2 oz (118 kg)      Other studies Reviewed: Additional studies/ records that were  reviewed today include:   Coronary CTA 07/15/2019: IMPRESSION: 1. Calcium score 8 isolated to mid LAD this is 22 nd percentile for age and sex  2.  Normal aortic root diameter 3.7 cm  3.  CAD RADS 1 non obstructive  CAD in mid LAD see description above  Echocardiogram 01/2018: - Left ventricle: The cavity size was normal. Wall thickness was  increased in a pattern of mild LVH. Systolic function was normal.  The estimated ejection fraction was in the range of 50% to 55%.  Wall motion was normal; there were no regional wall motion  abnormalities. Doppler parameters are consistent with abnormal  left ventricular relaxation (grade 1 diastolic dysfunction).  - Left atrium: The atrium was mildly dilated.  - Right ventricle: The cavity size was mildly dilated.  - Right atrium: The atrium was mildly dilated.  - Tricuspid valve: There was moderate regurgitation.  - Pulmonary arteries: Systolic pressure was moderately to severely  increased.   Impressions:   - Normal LV systolic function; mild diastolic dysfunction; mild  LVH; mild biatrial enlagement; mild RVE; moderate TR; moderate to  severe pulmonary hypertension.     ASSESSMENT AND PLAN:  1. Coronary artery disease: mild non-obstructive LAD disease noted on coronary CTA. No anginal complaints - Continue aspirin and statin  - Continue risk factor modifications below  2. Paroxysmal atrial fibrillation: He reports this was an isolated occurrence in the post-op setting without any reoccurrence since that time. He felt like he was symptomatic with SOB and increased HR at that time. Given recent findings of CAD on coronary CTA, patient now has a CHA2DS2-VASc Score of 2 (CAD and Age 62-74). We talked about anticoagulation, though he was hesitant given his Afib was an isolated episode. - Continue aspirin for now -  If recurrent afib, would recommend starting eliquis 5mg  BID for stroke ppx - Continue metoprolol for rate  control  3. HLD: LDL 131 06/2019. Subsequently started on atorvastatin 40mg  daily - Will repeat FLP today for close monitoring - Continue atorvastatin 40mg  daily - will plan to uptitrate if LDL not at goal of <70. He will need an updated prescription once labs return.     Current medicines are reviewed at length with the patient today.  The patient does not have concerns regarding medicines.  The following changes have been made:  As above  Labs/ tests ordered today include:   Orders Placed This Encounter  Procedures  . Lipid Profile  . EKG 12-Lead     Disposition:   FU with Dr. Debara Pickett or myself in 1 year  Signed, Abigail Butts, PA-C  01/25/2020 9:20 AM

## 2020-01-23 ENCOUNTER — Telehealth: Payer: Self-pay

## 2020-01-23 ENCOUNTER — Ambulatory Visit: Payer: Medicare PPO | Attending: Internal Medicine

## 2020-01-23 DIAGNOSIS — Z23 Encounter for immunization: Secondary | ICD-10-CM

## 2020-01-23 NOTE — Progress Notes (Signed)
   Covid-19 Vaccination Clinic  Name:  Kristopher Gay    MRN: [id number] DOB: 1951-07-14  01/23/2020  [name] was observed post Covid-19 immunization for 15 minutes without incident. He was provided with Vaccine Information Sheet and instruction to access the V-Safe system.   [name] was instructed to call 911 with any severe reactions post vaccine: Marland Kitchen Difficulty breathing  . Swelling of face and throat  . A fast heartbeat  . A bad rash all over body  . Dizziness and weakness   Immunizations Administered    Name Date Dose VIS Date Route   Pfizer COVID-19 Vaccine 01/23/2020  8:13 AM 0.3 mL 10/07/2019 Intramuscular   Manufacturer: [place]   Lot: [id number]   [place]: [id number]

## 2020-01-23 NOTE — Telephone Encounter (Signed)
Called the patient at the request of Roby Lofts, PA-C and asked that he not eat breakfast or drink any liquids other than water on the morning of his 01/25/20 8:45AM appointment due to [place] wanting to have fasting labs drawn. Patient verbalized an understanding.

## 2020-01-25 ENCOUNTER — Ambulatory Visit: Payer: Medicare PPO | Admitting: Medical

## 2020-01-25 ENCOUNTER — Other Ambulatory Visit: Payer: Self-pay

## 2020-01-25 ENCOUNTER — Encounter: Payer: Self-pay | Admitting: Medical

## 2020-01-25 VITALS — BP 118/72 | HR 55 | Ht 74.0 in | Wt 270.0 lb

## 2020-01-25 DIAGNOSIS — I2510 Atherosclerotic heart disease of native coronary artery without angina pectoris: Secondary | ICD-10-CM

## 2020-01-25 DIAGNOSIS — I480 Paroxysmal atrial fibrillation: Secondary | ICD-10-CM

## 2020-01-25 DIAGNOSIS — E785 Hyperlipidemia, unspecified: Secondary | ICD-10-CM | POA: Diagnosis not present

## 2020-01-25 LAB — LIPID PANEL
Chol/HDL Ratio: 2.9 ratio (ref 0.0–5.0)
Cholesterol, Total: 129 mg/dL (ref 100–199)
HDL: 44 mg/dL (ref >39)
LDL Chol Calc (NIH): 72 mg/dL (ref 0–99)
Triglycerides: 59 mg/dL (ref 0–149)
VLDL Cholesterol Cal: 13 mg/dL (ref 5–40)

## 2020-01-25 NOTE — Patient Instructions (Signed)
Medication Instructions:   Your physician recommends that you continue on your current medications as directed. Please refer to the Current Medication list given to you today.  *If you need a refill on your cardiac medications before your next appointment, please call your pharmacy*   Lab Work:  LIPID TODAY   If you have labs (blood work) drawn today and your tests are completely normal, you will receive your results only by: Marland Kitchen MyChart Message (if you have MyChart) OR . A paper copy in the mail If you have any lab test that is abnormal or we need to change your treatment, we will call you to review the results.   Testing/Procedures:  NONE ORDERED  TODAY    Follow-Up: At [facility], you and your health needs are our priority.  As part of our continuing mission to provide you with exceptional heart care, we have created designated Provider Care Teams.  These Care Teams include your primary Cardiologist (physician) and Advanced Practice Providers (APPs -  Physician Assistants and Nurse Practitioners) who all work together to provide you with the care you need, when you need it.  We recommend signing up for the patient portal called "MyChart".  Sign up information is provided on this After Visit Summary.  MyChart is used to connect with patients for Virtual Visits (Telemedicine).  Patients are able to view lab/test results, encounter notes, upcoming appointments, etc.  Non-urgent messages can be sent to your provider as well.   To learn more about what you can do with MyChart, go to [email or web address].    Your next appointment:   1 year(s)  The format for your next appointment:   In Person  Provider:   You may see [name], MD or one of the following Advanced Practice Providers on your designated Care Team:    Almyra Deforest, PA-C  Fabian Sharp, PA-C or   Roby Lofts, [place]    Other Instructions

## 2020-01-26 ENCOUNTER — Ambulatory Visit (INDEPENDENT_AMBULATORY_CARE_PROVIDER_SITE_OTHER): Payer: Medicare PPO | Admitting: *Deleted

## 2020-01-26 ENCOUNTER — Telehealth: Payer: Self-pay

## 2020-01-26 DIAGNOSIS — J309 Allergic rhinitis, unspecified: Secondary | ICD-10-CM

## 2020-01-26 NOTE — Telephone Encounter (Addendum)
Could not leave a voice message for the patient due to voicemail box is full. Will try calling patient again.  ----- Message from Abigail Butts, PA-C sent at 01/25/2020  5:18 PM EDT ----- See MyChart message. Please send a prescription for 90 days supply with refills for atorvastatin 40mg  daily. Thank you!

## 2020-01-27 ENCOUNTER — Other Ambulatory Visit: Payer: Self-pay

## 2020-01-27 ENCOUNTER — Telehealth: Payer: Self-pay | Admitting: Internal Medicine

## 2020-01-27 MED ORDER — ATORVASTATIN CALCIUM 40 MG PO TABS: 40.0000 mg | ORAL_TABLET | Freq: Every day | ORAL | 3 refills | Status: DC

## 2020-01-27 NOTE — Telephone Encounter (Signed)
Jiro called returning Kristopher Gay's call from yesterday. I reached out to Kristopher Gay via secure chat and then transferred the call over to her.

## 2020-02-02 ENCOUNTER — Ambulatory Visit (INDEPENDENT_AMBULATORY_CARE_PROVIDER_SITE_OTHER): Payer: Medicare PPO

## 2020-02-02 DIAGNOSIS — J309 Allergic rhinitis, unspecified: Secondary | ICD-10-CM

## 2020-02-02 NOTE — Progress Notes (Signed)
Vial exp 02-01-21

## 2020-02-04 ENCOUNTER — Other Ambulatory Visit: Payer: Self-pay | Admitting: Allergy

## 2020-02-07 ENCOUNTER — Other Ambulatory Visit: Payer: Self-pay | Admitting: Internal Medicine

## 2020-02-07 DIAGNOSIS — J3089 Other allergic rhinitis: Secondary | ICD-10-CM | POA: Diagnosis not present

## 2020-02-09 ENCOUNTER — Ambulatory Visit (INDEPENDENT_AMBULATORY_CARE_PROVIDER_SITE_OTHER): Payer: Medicare PPO

## 2020-02-09 DIAGNOSIS — J309 Allergic rhinitis, unspecified: Secondary | ICD-10-CM

## 2020-02-16 ENCOUNTER — Ambulatory Visit (INDEPENDENT_AMBULATORY_CARE_PROVIDER_SITE_OTHER): Payer: Medicare PPO

## 2020-02-16 DIAGNOSIS — J309 Allergic rhinitis, unspecified: Secondary | ICD-10-CM | POA: Diagnosis not present

## 2020-02-23 ENCOUNTER — Ambulatory Visit (INDEPENDENT_AMBULATORY_CARE_PROVIDER_SITE_OTHER): Payer: Medicare PPO

## 2020-02-23 DIAGNOSIS — J309 Allergic rhinitis, unspecified: Secondary | ICD-10-CM | POA: Diagnosis not present

## 2020-03-02 ENCOUNTER — Ambulatory Visit (INDEPENDENT_AMBULATORY_CARE_PROVIDER_SITE_OTHER): Payer: Medicare PPO

## 2020-03-02 DIAGNOSIS — J309 Allergic rhinitis, unspecified: Secondary | ICD-10-CM

## 2020-03-08 ENCOUNTER — Ambulatory Visit (INDEPENDENT_AMBULATORY_CARE_PROVIDER_SITE_OTHER): Payer: Medicare PPO

## 2020-03-08 DIAGNOSIS — J309 Allergic rhinitis, unspecified: Secondary | ICD-10-CM | POA: Diagnosis not present

## 2020-03-09 ENCOUNTER — Ambulatory Visit: Payer: Medicare Other | Admitting: Registered Nurse

## 2020-03-15 ENCOUNTER — Ambulatory Visit (INDEPENDENT_AMBULATORY_CARE_PROVIDER_SITE_OTHER): Payer: Medicare PPO

## 2020-03-15 DIAGNOSIS — J309 Allergic rhinitis, unspecified: Secondary | ICD-10-CM

## 2020-03-16 ENCOUNTER — Encounter: Payer: Medicare PPO | Attending: Registered Nurse | Admitting: Registered Nurse

## 2020-03-16 ENCOUNTER — Encounter: Payer: Self-pay | Admitting: Registered Nurse

## 2020-03-16 ENCOUNTER — Other Ambulatory Visit: Payer: Self-pay

## 2020-03-16 VITALS — BP 127/70 | HR 65 | Temp 97.5°F | Ht 74.0 in | Wt 269.0 lb

## 2020-03-16 DIAGNOSIS — M546 Pain in thoracic spine: Secondary | ICD-10-CM | POA: Insufficient documentation

## 2020-03-16 DIAGNOSIS — S8410XS Injury of peroneal nerve at lower leg level, unspecified leg, sequela: Secondary | ICD-10-CM | POA: Diagnosis not present

## 2020-03-16 DIAGNOSIS — M25521 Pain in right elbow: Secondary | ICD-10-CM | POA: Diagnosis not present

## 2020-03-16 DIAGNOSIS — M542 Cervicalgia: Secondary | ICD-10-CM | POA: Insufficient documentation

## 2020-03-16 DIAGNOSIS — M47817 Spondylosis without myelopathy or radiculopathy, lumbosacral region: Secondary | ICD-10-CM | POA: Insufficient documentation

## 2020-03-16 DIAGNOSIS — G8929 Other chronic pain: Secondary | ICD-10-CM | POA: Insufficient documentation

## 2020-03-16 DIAGNOSIS — M79641 Pain in right hand: Secondary | ICD-10-CM | POA: Insufficient documentation

## 2020-03-16 DIAGNOSIS — M25511 Pain in right shoulder: Secondary | ICD-10-CM | POA: Insufficient documentation

## 2020-03-16 DIAGNOSIS — M7062 Trochanteric bursitis, left hip: Secondary | ICD-10-CM | POA: Diagnosis present

## 2020-03-16 DIAGNOSIS — M7061 Trochanteric bursitis, right hip: Secondary | ICD-10-CM

## 2020-03-16 DIAGNOSIS — G894 Chronic pain syndrome: Secondary | ICD-10-CM | POA: Diagnosis not present

## 2020-03-16 DIAGNOSIS — Z79891 Long term (current) use of opiate analgesic: Secondary | ICD-10-CM | POA: Diagnosis present

## 2020-03-16 DIAGNOSIS — Z5181 Encounter for therapeutic drug level monitoring: Secondary | ICD-10-CM | POA: Diagnosis present

## 2020-03-16 DIAGNOSIS — G5621 Lesion of ulnar nerve, right upper limb: Secondary | ICD-10-CM

## 2020-03-16 MED ORDER — TRAMADOL HCL 50 MG PO TABS: 50.0000 mg | ORAL_TABLET | Freq: Two times a day (BID) | ORAL | 5 refills | Status: DC

## 2020-03-16 MED ORDER — CELECOXIB 100 MG PO CAPS: ORAL_CAPSULE | ORAL | 1 refills | Status: DC
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
## 2020-03-22 ENCOUNTER — Ambulatory Visit (INDEPENDENT_AMBULATORY_CARE_PROVIDER_SITE_OTHER): Payer: Medicare PPO

## 2020-03-22 DIAGNOSIS — J309 Allergic rhinitis, unspecified: Secondary | ICD-10-CM | POA: Diagnosis not present

## 2020-03-29 ENCOUNTER — Ambulatory Visit (INDEPENDENT_AMBULATORY_CARE_PROVIDER_SITE_OTHER): Payer: Medicare PPO

## 2020-03-29 DIAGNOSIS — J309 Allergic rhinitis, unspecified: Secondary | ICD-10-CM | POA: Diagnosis not present

## 2020-04-05 ENCOUNTER — Ambulatory Visit (INDEPENDENT_AMBULATORY_CARE_PROVIDER_SITE_OTHER): Payer: Medicare PPO

## 2020-04-05 DIAGNOSIS — J309 Allergic rhinitis, unspecified: Secondary | ICD-10-CM

## 2020-04-11 ENCOUNTER — Other Ambulatory Visit: Payer: Self-pay | Admitting: Registered Nurse

## 2020-04-12 ENCOUNTER — Ambulatory Visit (INDEPENDENT_AMBULATORY_CARE_PROVIDER_SITE_OTHER): Payer: Medicare PPO

## 2020-04-12 ENCOUNTER — Other Ambulatory Visit: Payer: Self-pay | Admitting: Registered Nurse

## 2020-04-12 DIAGNOSIS — J309 Allergic rhinitis, unspecified: Secondary | ICD-10-CM

## 2020-04-12 NOTE — Telephone Encounter (Signed)
Phone in to pharmacy letting them know that prescription of Tramadol was sent in on 03/16/2020 with 5 refills.

## 2020-05-01 ENCOUNTER — Other Ambulatory Visit: Payer: Self-pay | Admitting: Internal Medicine

## 2020-05-03 ENCOUNTER — Ambulatory Visit (INDEPENDENT_AMBULATORY_CARE_PROVIDER_SITE_OTHER): Payer: Medicare PPO

## 2020-05-03 DIAGNOSIS — J309 Allergic rhinitis, unspecified: Secondary | ICD-10-CM

## 2020-05-09 DIAGNOSIS — J3089 Other allergic rhinitis: Secondary | ICD-10-CM | POA: Diagnosis not present

## 2020-05-09 NOTE — Progress Notes (Signed)
Vial exp 05-09-21

## 2020-05-10 ENCOUNTER — Ambulatory Visit (INDEPENDENT_AMBULATORY_CARE_PROVIDER_SITE_OTHER): Payer: Medicare PPO

## 2020-05-10 DIAGNOSIS — J309 Allergic rhinitis, unspecified: Secondary | ICD-10-CM | POA: Diagnosis not present

## 2020-05-15 ENCOUNTER — Other Ambulatory Visit: Payer: Self-pay | Admitting: Allergy

## 2020-05-18 ENCOUNTER — Ambulatory Visit (INDEPENDENT_AMBULATORY_CARE_PROVIDER_SITE_OTHER): Payer: Medicare PPO

## 2020-05-18 DIAGNOSIS — J309 Allergic rhinitis, unspecified: Secondary | ICD-10-CM

## 2020-05-25 ENCOUNTER — Ambulatory Visit (INDEPENDENT_AMBULATORY_CARE_PROVIDER_SITE_OTHER): Payer: Medicare PPO

## 2020-05-25 DIAGNOSIS — J309 Allergic rhinitis, unspecified: Secondary | ICD-10-CM | POA: Diagnosis not present

## 2020-06-01 ENCOUNTER — Ambulatory Visit (INDEPENDENT_AMBULATORY_CARE_PROVIDER_SITE_OTHER): Payer: Medicare PPO

## 2020-06-01 DIAGNOSIS — J309 Allergic rhinitis, unspecified: Secondary | ICD-10-CM | POA: Diagnosis not present

## 2020-06-08 ENCOUNTER — Ambulatory Visit (INDEPENDENT_AMBULATORY_CARE_PROVIDER_SITE_OTHER): Payer: Medicare PPO | Admitting: *Deleted

## 2020-06-08 DIAGNOSIS — J309 Allergic rhinitis, unspecified: Secondary | ICD-10-CM | POA: Diagnosis not present

## 2020-06-11 ENCOUNTER — Telehealth: Payer: Self-pay | Admitting: Internal Medicine

## 2020-06-11 ENCOUNTER — Telehealth: Payer: Self-pay

## 2020-06-11 ENCOUNTER — Telehealth: Payer: Self-pay | Admitting: Allergy

## 2020-06-11 NOTE — Telephone Encounter (Signed)
Patient calling to see if he qualifies for the covid booster shot. CDC guidelines provided, Who needs additional covid 19 vaccine? Patient is not on any prednisone for his COPD. Patient encouraged to discuss his other medical conditions and medications with his PCP to see if he qualifies. Patient verbalized understanding.

## 2020-06-11 NOTE — Telephone Encounter (Signed)
Please advise. Also, a message was sent to his pulmonologist.

## 2020-06-11 NOTE — Telephone Encounter (Signed)
Explained to him that as per CDC recommendations he at the present time does not qualify but will definitely qualify within the next several months.

## 2020-06-11 NOTE — Telephone Encounter (Signed)
Pt. Called stating he wanted to know if he qualified for the covid booster shot and if not what does qualify you for the booster shot at this time. He said [facility] is giving the booster shot already.

## 2020-06-11 NOTE — Telephone Encounter (Signed)
Patient calling stating the recording at his pharmacy stated he may qualify for the pfizer booster shot. He would like to now if he would qualify for it.

## 2020-06-11 NOTE — Telephone Encounter (Signed)
Patient called and states his pharmacy said his medical conditions may qualify him for a pfizer booster shot. Patient was informed to call his doctor to see if his asthma and pulmonary conditions make him qualified.  Please advise.

## 2020-06-11 NOTE — Telephone Encounter (Signed)
Only immunocompromised patients or those on immunosuppressant medications at this time would qualify.  Dr Lemmie Evens

## 2020-06-11 NOTE — Telephone Encounter (Signed)
Patient called into office regarding the pfizer covid booster shot. Patient would like to know if he qualifies or not.   Returned call to patient, he was reached out out to PCP already and waiting to hear back , but wanted to reach out to Dr. Debara Pickett to see if his heart conditions would qualify him for the pfizer booster?   Advised patient I would forward this message to Dr. Debara Pickett for advice.

## 2020-06-12 NOTE — Telephone Encounter (Signed)
Left message for patient to cal back.

## 2020-06-12 NOTE — Telephone Encounter (Signed)
Pt. Aware of your recommendations.

## 2020-06-12 NOTE — Telephone Encounter (Signed)
At this time per my understanding the booster 3rd dose is for people who are immunocompromised.  His allergy and asthma medications including allergen immunotherapy are not immunosuppressing and he would not be considered immunocompromised.   Thus he would not qualify for the booster dose at this time under the current criteria.

## 2020-06-12 NOTE — Telephone Encounter (Signed)
Left message that MD reply will be sent via MyChart as voicemail was generic and not name-verified

## 2020-06-12 NOTE — Telephone Encounter (Signed)
Patient called back and was relayed the message per Dr. Nelva Bush. Patient verbalized understanding.

## 2020-06-15 ENCOUNTER — Ambulatory Visit (INDEPENDENT_AMBULATORY_CARE_PROVIDER_SITE_OTHER): Payer: Medicare PPO | Admitting: *Deleted

## 2020-06-15 DIAGNOSIS — J309 Allergic rhinitis, unspecified: Secondary | ICD-10-CM | POA: Diagnosis not present

## 2020-06-22 ENCOUNTER — Ambulatory Visit (INDEPENDENT_AMBULATORY_CARE_PROVIDER_SITE_OTHER): Payer: Medicare PPO | Admitting: *Deleted

## 2020-06-22 DIAGNOSIS — J309 Allergic rhinitis, unspecified: Secondary | ICD-10-CM | POA: Diagnosis not present

## 2020-07-05 ENCOUNTER — Emergency Department (HOSPITAL_COMMUNITY)
Admission: EM | Admit: 2020-07-05 | Discharge: 2020-07-09 | Disposition: A | Discharge status: Home / Self Care | Payer: Medicare PPO | Attending: Emergency Medicine | Admitting: Emergency Medicine

## 2020-07-05 ENCOUNTER — Encounter (HOSPITAL_COMMUNITY): Payer: Self-pay | Admitting: *Deleted

## 2020-07-05 ENCOUNTER — Other Ambulatory Visit: Payer: Self-pay

## 2020-07-05 ENCOUNTER — Emergency Department (HOSPITAL_COMMUNITY): Payer: Medicare PPO

## 2020-07-05 DIAGNOSIS — W19XXXA Unspecified fall, initial encounter: Secondary | ICD-10-CM | POA: Diagnosis not present

## 2020-07-05 DIAGNOSIS — J45909 Unspecified asthma, uncomplicated: Secondary | ICD-10-CM | POA: Insufficient documentation

## 2020-07-05 DIAGNOSIS — J449 Chronic obstructive pulmonary disease, unspecified: Secondary | ICD-10-CM | POA: Diagnosis not present

## 2020-07-05 DIAGNOSIS — Y93A6 Activity, grass drills: Secondary | ICD-10-CM | POA: Diagnosis not present

## 2020-07-05 DIAGNOSIS — Z7982 Long term (current) use of aspirin: Secondary | ICD-10-CM | POA: Diagnosis not present

## 2020-07-05 DIAGNOSIS — M25561 Pain in right knee: Secondary | ICD-10-CM | POA: Diagnosis not present

## 2020-07-05 DIAGNOSIS — Z20822 Contact with and (suspected) exposure to covid-19: Secondary | ICD-10-CM | POA: Diagnosis not present

## 2020-07-05 DIAGNOSIS — Z79899 Other long term (current) drug therapy: Secondary | ICD-10-CM | POA: Diagnosis not present

## 2020-07-05 DIAGNOSIS — S86812A Strain of other muscle(s) and tendon(s) at lower leg level, left leg, initial encounter: Secondary | ICD-10-CM | POA: Diagnosis not present

## 2020-07-05 DIAGNOSIS — S92341A Displaced fracture of fourth metatarsal bone, right foot, initial encounter for closed fracture: Secondary | ICD-10-CM | POA: Diagnosis not present

## 2020-07-05 DIAGNOSIS — I10 Essential (primary) hypertension: Secondary | ICD-10-CM | POA: Diagnosis not present

## 2020-07-05 DIAGNOSIS — R0902 Hypoxemia: Secondary | ICD-10-CM | POA: Diagnosis not present

## 2020-07-05 DIAGNOSIS — R52 Pain, unspecified: Secondary | ICD-10-CM | POA: Diagnosis not present

## 2020-07-05 DIAGNOSIS — M7989 Other specified soft tissue disorders: Secondary | ICD-10-CM | POA: Diagnosis not present

## 2020-07-05 DIAGNOSIS — W010XXA Fall on same level from slipping, tripping and stumbling without subsequent striking against object, initial encounter: Secondary | ICD-10-CM | POA: Insufficient documentation

## 2020-07-05 DIAGNOSIS — M25461 Effusion, right knee: Secondary | ICD-10-CM | POA: Diagnosis not present

## 2020-07-05 DIAGNOSIS — S99921A Unspecified injury of right foot, initial encounter: Secondary | ICD-10-CM | POA: Diagnosis present

## 2020-07-05 LAB — CBC WITH DIFFERENTIAL/PLATELET
Abs Immature Granulocytes: 0.06 10*3/uL (ref 0.00–0.07)
Basophils Absolute: 0.1 10*3/uL (ref 0.0–0.1)
Basophils Relative: 1 %
Eosinophils Absolute: 0 10*3/uL (ref 0.0–0.5)
Eosinophils Relative: 0 %
HCT: 39.9 % (ref 39.0–52.0)
Hemoglobin: 13.9 g/dL (ref 13.0–17.0)
Immature Granulocytes: 1 %
Lymphocytes Relative: 10 %
Lymphs Abs: 1.1 10*3/uL (ref 0.7–4.0)
MCH: 33 pg (ref 26.0–34.0)
MCHC: 34.8 g/dL (ref 30.0–36.0)
MCV: 94.8 fL (ref 80.0–100.0)
Monocytes Absolute: 0.7 10*3/uL (ref 0.1–1.0)
Monocytes Relative: 6 %
Neutro Abs: 9.1 10*3/uL — ABNORMAL HIGH (ref 1.7–7.7)
Neutrophils Relative %: 82 %
Platelets: 147 10*3/uL — ABNORMAL LOW (ref 150–400)
RBC: 4.21 MIL/uL — ABNORMAL LOW (ref 4.22–5.81)
RDW: 12.5 % (ref 11.5–15.5)
WBC: 11 10*3/uL — ABNORMAL HIGH (ref 4.0–10.5)
nRBC: 0 % (ref 0.0–0.2)

## 2020-07-05 NOTE — ED Triage Notes (Signed)
Pt says that he slipped and his knee went under him, says he is unable to stand on it. Swelling/bruising to anterior knee.

## 2020-07-05 NOTE — ED Triage Notes (Signed)
Pt arrives via [facility] from home. Mowing lawn today, 1930 fell, able to get himself off the floor and get into his house. Ambulated to ambulance when EMS arrived. Swelling to right knee, painful, bruising. 160/100, hr 90, cbg 123, rr 16.

## 2020-07-05 NOTE — ED Provider Notes (Signed)
[facility] DEPT Provider Note   CSN: [id number] Arrival date & time: 07/05/20  2118     History Chief Complaint  Patient presents with  . Fall    Kristopher Gay is a 69 y.o. male.  Patient is a 69 year old male who presents with knee pain.  He says that he was mowing his grass and he slipped and fell down with his leg twisting under him.  He complains of pain and swelling to his right knee.  He says the joint feels unstable and is fine several times since that happened earlier today.  He also complains of pain to his right foot.  He denies any other injuries from the fall.  He has had some arthritis changes to the right knee but no other prior injuries.        Past Medical History:  Diagnosis Date  . A-fib ([place])   . Allergic rhinitis, cause unspecified   . Allergy   . Anxiety   . Blood clot in vein 2002   left calf SUPERFICIAL CLOT REMOVED THEN PART OF VEIN REMOVED  . Chronic headaches   . COPD (chronic obstructive pulmonary disease) ([place])   . Cough    X 1 YEAR NO FEVER CLEAR SPUTUM  . DDD (degenerative disc disease)   . Depression   . DJD (degenerative joint disease)    ALL THE WAY DOWN SPINE  . Dropfoot    LEFT , NO BRACE WORN NOW  . Emphysema of lung ([place])   . Extrinsic asthma, unspecified   . GERD (gastroesophageal reflux disease)    OCC NO MEDS FOR  . Hypertension    STOPPED HTN MEDS UNTIL 2011, THEN HAD WEIGHT LOSS, NO MEDS SINCE  . Neuromuscular disorder ([place])   . Neuropathy    POLYNEUOPATHOPATHY FEET AND LEGS  . Peripheral vascular disease ([facility])    VARICOSE VEINS LEFT LEG  . Pneumonia AS CHILD  . PONV (postoperative nausea and vomiting)   . Syncope   . Ulnar nerve entrapment at elbow, right   . Umbilical hernia   . Unspecified asthma(493.90)     Patient Active Problem List   Diagnosis Date Noted  . Ulnar neuropathy at elbow, right 05/03/2018  . Neuritis of right ulnar nerve 04/27/2018  . Maxillary sinusitis, acute  03/25/2018  . PAF (paroxysmal atrial fibrillation) ([place]) 02/20/2018  . Chronic right shoulder pain 05/15/2016  . Right knee pain 05/15/2016  . Adrenal adenoma 02/20/2016  . Chronic pain syndrome 10/12/2015  . Dysphagia, pharyngoesophageal phase 09/14/2014  . Other chronic postoperative pain 01/16/2014  . Allergy, insect bite 07/02/2012  . GERD (gastroesophageal reflux disease) 03/25/2012  . 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 Lumbosacral spondylosis 01/13/2012  . Chronic back pain 11/17/2011  . Seasonal and perennial allergic rhinitis 12/26/2010  . Chronic pain 04/16/2009  . COPD (chronic obstructive pulmonary disease) ([place]) 01/30/2008    Past Surgical History:  Procedure Laterality Date  . COLONSCOPY  06/16/2017  . HERNIA REPAIR    . INGUINAL HERNIA REPAIR Bilateral 07/20/2017   Procedure: LAPAROSCOPIC BILATERAL INGUINAL HERNIA REPAIR WITH MESH, UMBILICAL HERNIA REPAIR AND LYSIS OF ADHESIONS;  Surgeon: [name], [name], MD;  [facility];  Service: General;  Laterality: Bilateral;  . LAPAROSCOPY N/A 07/20/2017   Procedure: LAPAROSCOPY DIAGNOSTIC;  Surgeon: Kieth Brightly [name], MD;  Location[facility];  Service: General;  Laterality: N/A;  . ROTATOR CUFF REPAIR Left 2005   detached; left shoulder-reattached  . SHOULDER SURGERY Right  laBRAL  tendon torn  . SUPERFICIAL LEFT LEG VEIN STRIPPING WITH SMALL SUPERFICIAL CLOT  2002  . ULNAR NERVE TRANSPOSITION Right 05/10/2018   Procedure: RIGHT ELBOW ULNAR NERVE RELEASE;  Surgeon: Iran Planas, MD;  Location: [facility];  Service: Orthopedics;  Laterality: Right;  . UMBILICAL HERNIA REPAIR  02/18/2018  . UMBILICAL HERNIA REPAIR N/A 02/18/2018   Procedure: LAPAROSCOPIC UMBILICAL HERNIA REPAIR ERAS PATHWAY;  Surgeon: [name], [name], MD;  Location: [facility];  Service: General;  Laterality: N/A;       Family History  Problem Relation Age of Onset  . Pancreatic cancer Mother   . Breast cancer Sister   . Epilepsy Brother    . Adrenal disorder Neg Hx     Social History   Tobacco Use  . Smoking status: Never Smoker  . Smokeless tobacco: Never Used  Vaping Use  . Vaping Use: Never assessed  Substance Use Topics  . Alcohol use: No    Alcohol/week: 0.0 standard drinks  . Drug use: No    Home Medications Prior to Admission medications   Medication Sig Start Date End Date Taking? Authorizing Provider  ascorbic acid (VITAMIN C) 1000 MG tablet Take 1,000 mg by mouth daily.     [provider]  aspirin EC 81 MG tablet Take 81 mg by mouth daily.    [provider]  atorvastatin (LIPITOR) 40 MG tablet Take 1 tablet (40 mg total) by mouth daily. 01/27/20   Kroeger, Lorelee Cover., PA-C  Calcium Carbonate-Vit D-Min (CALCIUM 1200 PO) Take 1 tablet by mouth daily.    [provider]  celecoxib (CELEBREX) 100 MG capsule TAKE 1 CAPSULE BY MOUTH TWICE DAILY 04/12/20   Bayard Hugger, NP  Cholecalciferol (VITAMIN D3) 2000 units TABS Take 2,000 Units by mouth 2 (two) times daily.     [provider]  DHEA 25 MG CAPS Take by mouth.    [provider]  DYMISTA 137-50 MCG/ACT SUSP SHAKE LIQUID AND USE 1 SPRAY IN EACH NOSTRIL TWICE DAILY AS NEEDED 10/17/19   Bobbitt, Sedalia Muta, MD  EPINEPHrine (EPIPEN 2-PAK) 0.3 mg/0.3 mL SOAJ injection Inject 0.3 mg into the muscle once.     [provider]  fluticasone (FLONASE) 50 MCG/ACT nasal spray SHAKE LIQUID AND USE 2 SPRAYS IN EACH NOSTRIL DAILY 05/02/19   Young, [name], MD  gabapentin (NEURONTIN) 300 MG capsule TAKE 2 CAPSULE BY MOUTH IN THE MORNING, TAKE 1 CAPSULE AT MIDDAY AND 2 CAPSULE BY MOUTH AT BEDTIME 12/14/19   Kirsteins, Luanna Salk, MD  glucosamine-chondroitin 500-400 MG tablet Take 1 tablet by mouth 2 (two) times daily.     [provider]  Magnesium 250 MG TABS Take by mouth.    [provider]  metoprolol succinate (TOPROL-XL) 25 MG 24 hr tablet TAKE 1 TABLET(25 MG) BY MOUTH DAILY 05/01/20   [name], Nadean Corwin, MD  montelukast (SINGULAIR) 10 MG tablet TAKE 1 TABLET(10 MG) BY MOUTH AT BEDTIME 05/15/20   Kennith Gain, MD  Multiple Vitamin (MULTIVITAMIN) tablet Take 1 tablet by mouth 2 (two) times daily.     [provider]  nitroGLYCERIN (NITROSTAT) 0.4 MG SL tablet Place 1 tablet (0.4 mg total) under the tongue every 5 (five) minutes as needed for chest pain. Patient not taking: Reported on 03/16/2020 07/06/19 11/15/19  Abigail Butts., PA-C  Omega-3 Fatty Acids (FISH OIL ADULT GUMMIES PO) Take by mouth.    [provider]  traMADol (ULTRAM) 50 MG  tablet TAKE 1 TABLET(50 MG) BY MOUTH TWICE DAILY 04/12/20   Bayard Hugger, NP  TRELEGY ELLIPTA 100-62.5-25 MCG/INH AEPB INHALE 1 PUFF INTO THE LUNGS EVERY DAY 11/09/19   [name], MD  vitamin B-12 (CYANOCOBALAMIN) 1000 MCG tablet Take 1,000 mcg by mouth daily.    [provider]  vitamin E 400 UNIT capsule Take 400 Units by mouth daily.    [provider]    Allergies    Bee venom, Linzess [linaclotide], Molds & smuts, and Seasonal ic [cholestatin]  Review of Systems   Review of Systems  Constitutional: Negative for fever.  Gastrointestinal: Negative for nausea and vomiting.  Musculoskeletal: Positive for arthralgias and joint swelling. Negative for back pain and neck pain.  Skin: Negative for wound.  Neurological: Negative for weakness, numbness and headaches.    Physical Exam Updated Vital Signs BP (!) 152/97 (BP Location: Right Arm)   Pulse 91   Temp 98.8 F (37.1 C) (Oral)   Resp 16   SpO2 94%   Physical Exam Constitutional:      Appearance: He is well-developed.  HENT:     Head: Normocephalic and atraumatic.  Cardiovascular:     Rate and Rhythm: Normal rate.  Pulmonary:     Effort: Pulmonary effort is normal.  Musculoskeletal:        General: Tenderness present.     Cervical back: Normal range of motion and neck supple.     Comments: Patient with large joint effusion to the  right knee.  There are some ecchymosis to the anterior part of the knee.  He is unable to do a straight leg raise.  He seems to have good defect at the patellar tendon insertion suspicious for rupture.  Unable to adequately ascertain the stability of the quadriceps tendon.  His joint feels loose but I cannot get a good examination ligaments due to the swelling.  He also has some mild tenderness to the anterior portion of his right foot.  No pain to the ankle.  No pain to the hip.  Pedal pulses are intact.  He has normal sensation and motor function distally.  Skin:    General: Skin is warm and dry.  Neurological:     Mental Status: He is alert and oriented to person, place, and time.     ED Results / Procedures / Treatments   Labs (all labs ordered are listed, but only abnormal results are displayed) Labs Reviewed  SARS CORONAVIRUS 2 BY RT PCR (HOSPITAL ORDER, [facility])  BASIC METABOLIC PANEL  CBC WITH DIFFERENTIAL/PLATELET    EKG None  Radiology DG Knee Complete 4 Views Right  Result Date: 07/05/2020 CLINICAL DATA:  Initial evaluation for acute pain status post fall. EXAM: RIGHT KNEE - COMPLETE 4+ VIEW COMPARISON:  Prior radiograph from 08/17/2008. FINDINGS: No acute fracture or dislocation. Prominent soft tissue swelling seen at the anterior/superior aspect of the knee. Suspected underlying joint effusion. Minimal osteoarthritic changes about the knee. Mild osteopenia. IMPRESSION: 1. No acute fracture or dislocation. 2. Prominent soft tissue swelling at the anterior/superior aspect of the knee. Suspected underlying joint effusion. Electronically Signed   By: Jeannine Boga M.D.   On: 07/05/2020 22:11   DG Foot Complete Right  Result Date: 07/05/2020 CLINICAL DATA:  Post fall with right foot pain. Fall in yard earlier today. Pain, bruising and swelling across the metatarsals. EXAM: RIGHT FOOT COMPLETE - 3+ VIEW COMPARISON:  None. FINDINGS: Probable  nondisplaced transverse  fifth toe proximal phalanx fracture without intra-articular extension. There is a minimally displaced fracture involving the fourth metatarsal neck. Overall normal alignment. Normal Lisfranc alignment. No mid or hindfoot fracture. Soft tissue edema noted about the forefoot. IMPRESSION: 1. Minimally displaced fourth metatarsal neck fracture. 2. Probable nondisplaced transverse fifth toe proximal phalanx fracture. Electronically Signed   By: Keith Rake M.D.   On: 07/05/2020 22:56    Procedures Procedures (including critical care time)  Medications Ordered in ED Medications - No data to display  ED Course  I have reviewed the triage vital signs and the nursing notes.  Pertinent labs & imaging results that were available during my care of the patient were reviewed by me and considered in my medical decision making (see chart for details).    MDM Rules/Calculators/A&P                          Patient is a 69 year old male who presents after a fall onto his knee.  He has a large effusion and a suspected patellar tendon rupture of the right knee.  There is no evidence of fracture.  His foot x-rays actually do reveal a minimally displaced fracture of the fourth metatarsal neck.  Patient is placed in a knee immobilizer.  I did discuss this with Dr. Mardelle Matte.  He was placed in a postop shoe and was advised to be nonweightbearing.  I did not feel that he would be able to tolerate a posterior splint in association with a knee immobilizer.  He advises that he is unable to take care of self at home with these injuries.  He does not seem appropriate for inpatient admission.  He says that he cannot go home because he cannot take care of himself.  He wants to be admitted to a nursing home.  He feels like he is going to fall and hurt himself more if he goes home.  I advised him that I am not able to admit him to a nursing facility tonight but will do social work consult.  He was advised  that he will need to follow-up with the orthopedist regarding his injuries.  He is currently denying the need for any pain medication.  Care turned over to oncoming provider pending social work consult. Final Clinical Impression(s) / ED Diagnoses Final diagnoses:  Knee effusion, right  Rupture of left patellar tendon, initial encounter  Closed displaced fracture of fourth metatarsal bone of right foot, initial encounter    Rx / DC Orders ED Discharge Orders    None       Malvin Johns, MD 07/05/20 2339

## 2020-07-06 LAB — BASIC METABOLIC PANEL
Anion gap: 6 (ref 5–15)
BUN: 16 mg/dL (ref 8–23)
CO2: 24 mmol/L (ref 22–32)
Calcium: 8.6 mg/dL — ABNORMAL LOW (ref 8.9–10.3)
Chloride: 109 mmol/L (ref 98–111)
Creatinine, Ser: 0.94 mg/dL (ref 0.61–1.24)
GFR calc Af Amer: >60 mL/min (ref >60)
GFR calc non Af Amer: >60 mL/min (ref >60)
Glucose, Bld: 112 mg/dL — ABNORMAL HIGH (ref 70–99)
Potassium: 4.1 mmol/L (ref 3.5–5.1)
Sodium: 139 mmol/L (ref 135–145)

## 2020-07-06 LAB — SARS CORONAVIRUS 2 BY RT PCR (HOSPITAL ORDER, PERFORMED IN ~~LOC~~ HOSPITAL LAB): SARS Coronavirus 2: NEGATIVE

## 2020-07-06 MED ORDER — TRAMADOL HCL 50 MG PO TABS
50.0000 mg | ORAL_TABLET | Freq: Two times a day (BID) | ORAL | Status: DC
  Filled 2020-07-06 (×3): qty 1

## 2020-07-06 MED ORDER — VITAMIN E 180 MG (400 UNIT) PO CAPS
400.0000 [IU] | ORAL_CAPSULE | Freq: Every day | ORAL | Status: DC
  Administered 2020-07-08 – 2020-07-09 (×2): 400 [IU] via ORAL
  Filled 2020-07-06 (×3): qty 1

## 2020-07-06 MED ORDER — VITAMIN B-12 1000 MCG PO TABS
1000.0000 ug | ORAL_TABLET | Freq: Every day | ORAL | Status: DC
  Administered 2020-07-07 – 2020-07-09 (×3): 1000 ug via ORAL
  Filled 2020-07-06 (×3): qty 1

## 2020-07-06 MED ORDER — FLUTICASONE PROPIONATE 50 MCG/ACT NA SUSP
2.0000 | Freq: Every day | NASAL | Status: DC
  Filled 2020-07-06: qty 16

## 2020-07-06 MED ORDER — CELECOXIB 100 MG PO CAPS
100.0000 mg | ORAL_CAPSULE | Freq: Two times a day (BID) | ORAL | Status: DC
  Administered 2020-07-07 – 2020-07-09 (×5): 100 mg via ORAL
  Filled 2020-07-06 (×7): qty 1

## 2020-07-06 MED ORDER — MAGNESIUM OXIDE 400 (241.3 MG) MG PO TABS
200.0000 mg | ORAL_TABLET | Freq: Every day | ORAL | Status: DC
  Administered 2020-07-07 – 2020-07-09 (×3): 200 mg via ORAL
  Filled 2020-07-06 (×3): qty 1

## 2020-07-06 MED ORDER — FLUTICASONE FUROATE-VILANTEROL 100-25 MCG/INH IN AEPB
1.0000 | INHALATION_SPRAY | Freq: Every day | RESPIRATORY_TRACT | Status: DC
  Filled 2020-07-06: qty 28

## 2020-07-06 MED ORDER — METOPROLOL SUCCINATE ER 50 MG PO TB24
25.0000 mg | ORAL_TABLET | Freq: Every day | ORAL | Status: DC
  Administered 2020-07-07 – 2020-07-09 (×3): 25 mg via ORAL
  Filled 2020-07-06 (×3): qty 1

## 2020-07-06 MED ORDER — ASCORBIC ACID 500 MG PO TABS
1000.0000 mg | ORAL_TABLET | Freq: Every day | ORAL | Status: DC
  Administered 2020-07-07 – 2020-07-09 (×3): 1000 mg via ORAL
  Filled 2020-07-06 (×4): qty 2

## 2020-07-06 MED ORDER — FLUTICASONE-UMECLIDIN-VILANT 100-62.5-25 MCG/INH IN AEPB: 1.0000 | INHALATION_SPRAY | Freq: Every day | RESPIRATORY_TRACT | Status: DC

## 2020-07-06 MED ORDER — UMECLIDINIUM BROMIDE 62.5 MCG/INH IN AEPB
1.0000 | INHALATION_SPRAY | Freq: Every day | RESPIRATORY_TRACT | Status: DC
  Filled 2020-07-06: qty 7

## 2020-07-06 MED ORDER — GABAPENTIN 300 MG PO CAPS
300.0000 mg | ORAL_CAPSULE | ORAL | Status: DC
  Administered 2020-07-07 – 2020-07-09 (×4): 600 mg via ORAL
  Filled 2020-07-06 (×4): qty 2

## 2020-07-06 MED ORDER — MONTELUKAST SODIUM 10 MG PO TABS
10.0000 mg | ORAL_TABLET | Freq: Every day | ORAL | Status: DC
  Administered 2020-07-07 – 2020-07-09 (×2): 10 mg via ORAL
  Filled 2020-07-06 (×3): qty 1

## 2020-07-06 MED ORDER — ATORVASTATIN CALCIUM 40 MG PO TABS
40.0000 mg | ORAL_TABLET | Freq: Every day | ORAL | Status: DC
  Administered 2020-07-08 – 2020-07-09 (×2): 40 mg via ORAL
  Filled 2020-07-06 (×3): qty 1

## 2020-07-06 NOTE — ED Notes (Signed)
Called PT office to have this patient put on their list to be evaluated ASAP.

## 2020-07-06 NOTE — NC FL2 (Addendum)
[place] MEDICAID FL2 LEVEL OF CARE SCREENING TOOL     IDENTIFICATION  Patient Name: Kristopher Gay Birthdate: February 11, 1951 Sex: male Admission Date (Current Location): 07/05/2020  [facility] and [place] Number:  Herbalist and Address:  [facility],  [place] [address] [place]      Provider Number: [id number]  Attending Physician Name and Address:  Default, Provider, MD  Relative Name and Phone Number:       Current Level of Care: Hospital Recommended Level of Care: [place] Prior Approval Number:    Date Approved/Denied:   PASRR Number: [id number]  Discharge Plan: SNF    Current Diagnoses: Patient Active Problem List   Diagnosis Date Noted  . Ulnar neuropathy at elbow, right 05/03/2018  . Neuritis of right ulnar nerve 04/27/2018  . Maxillary sinusitis, acute 03/25/2018  . PAF (paroxysmal atrial fibrillation) ([place]) 02/20/2018  . Chronic right shoulder pain 05/15/2016  . Right knee pain 05/15/2016  . Adrenal adenoma 02/20/2016  . Chronic pain syndrome 10/12/2015  . Dysphagia, pharyngoesophageal phase 09/14/2014  . Other chronic postoperative pain 01/16/2014  . Allergy, insect bite 07/02/2012  . GERD (gastroesophageal reflux disease) 03/25/2012  . 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 Lumbosacral spondylosis 01/13/2012  . Chronic back pain 11/17/2011  . Seasonal and perennial allergic rhinitis 12/26/2010  . Chronic pain 04/16/2009  . COPD (chronic obstructive pulmonary disease) ([place]) 01/30/2008    Orientation RESPIRATION BLADDER Height & Weight     Self, Time, Situation, Place  Normal Continent Weight:   Height:     BEHAVIORAL SYMPTOMS/MOOD NEUROLOGICAL BOWEL NUTRITION STATUS      Continent Diet (regular)  AMBULATORY STATUS COMMUNICATION OF NEEDS Skin   Extensive Assist   Normal                       Personal Care Assistance Level of Assistance  Bathing, Dressing, Feeding Bathing Assistance:  Independent Feeding assistance: Independent Dressing Assistance: Independent     Functional Limitations Info  Sight, Speech, Hearing Sight Info: Adequate Hearing Info: Adequate Speech Info: Adequate    SPECIAL CARE FACTORS FREQUENCY  PT (By licensed PT), OT (By licensed OT)     PT Frequency: 5x per week OT Frequency: 5x per week            Contractures Contractures Info: Not present    Additional Factors Info  Code Status, Allergies Code Status Info: full Allergies Info: bee venon, mold, smut, linzess           Current Medications (07/06/2020):  This is the current hospital active medication list No current facility-administered medications for this encounter.   Current Outpatient Medications  Medication Sig Dispense Refill  . ascorbic acid (VITAMIN C) 1000 MG tablet Take 1,000 mg by mouth daily.     Marland Kitchen aspirin EC 81 MG tablet Take 81 mg by mouth daily.    Marland Kitchen atorvastatin (LIPITOR) 40 MG tablet Take 1 tablet (40 mg total) by mouth daily. 90 tablet 3  . Calcium Carbonate-Vit D-Min (CALCIUM 1200 PO) Take 1 tablet by mouth daily.    . celecoxib (CELEBREX) 100 MG capsule TAKE 1 CAPSULE BY MOUTH TWICE DAILY (Patient taking differently: Take 100 mg by mouth 2 (two) times daily. ) 180 capsule 1  . Cholecalciferol (VITAMIN D3) 2000 units TABS Take 2,000 Units by mouth 2 (two) times daily.     Marland Kitchen DHEA 25 MG CAPS Take 1 capsule by mouth daily.     Marland Kitchen  DYMISTA 137-50 MCG/ACT SUSP SHAKE LIQUID AND USE 1 SPRAY IN EACH NOSTRIL TWICE DAILY AS NEEDED (Patient taking differently: Inhale 1 spray into the lungs 2 (two) times daily as needed (congestion). ) 23 g 2  . EPINEPHrine (EPIPEN 2-PAK) 0.3 mg/0.3 mL SOAJ injection Inject 0.3 mg into the muscle once.     . fluticasone (FLONASE) 50 MCG/ACT nasal spray SHAKE LIQUID AND USE 2 SPRAYS IN EACH NOSTRIL DAILY (Patient taking differently: Place 2 sprays into both nostrils daily. ) 16 g 12  . gabapentin (NEURONTIN) 300 MG capsule TAKE 2 CAPSULE BY  MOUTH IN THE MORNING, TAKE 1 CAPSULE AT MIDDAY AND 2 CAPSULE BY MOUTH AT BEDTIME (Patient taking differently: Take 300-600 mg by mouth See admin instructions. TAKE 2 CAPSULE BY MOUTH IN THE MORNING, TAKE 1 CAPSULE AT MIDDAY AND 2 CAPSULE BY MOUTH AT BEDTIME) 150 capsule 5  . glucosamine-chondroitin 500-400 MG tablet Take 1 tablet by mouth 2 (two) times daily.     . Magnesium 250 MG TABS Take 1 tablet by mouth daily.     . metoprolol succinate (TOPROL-XL) 25 MG 24 hr tablet TAKE 1 TABLET(25 MG) BY MOUTH DAILY (Patient taking differently: Take 25 mg by mouth daily. ) 90 tablet 0  . montelukast (SINGULAIR) 10 MG tablet TAKE 1 TABLET(10 MG) BY MOUTH AT BEDTIME (Patient taking differently: Take 10 mg by mouth at bedtime. ) 90 tablet 0  . Multiple Vitamin (MULTIVITAMIN) tablet Take 1 tablet by mouth 2 (two) times daily.     . Omega-3 Fatty Acids (FISH OIL ADULT GUMMIES PO) Take 1 capsule by mouth daily.     . traMADol (ULTRAM) 50 MG tablet TAKE 1 TABLET(50 MG) BY MOUTH TWICE DAILY (Patient taking differently: Take 50 mg by mouth 2 (two) times daily. ) 60 tablet 5  . TRELEGY ELLIPTA 100-62.5-25 MCG/INH AEPB INHALE 1 PUFF INTO THE LUNGS EVERY DAY (Patient taking differently: Inhale 1 puff into the lungs daily. ) 60 each 12  . vitamin B-12 (CYANOCOBALAMIN) 1000 MCG tablet Take 1,000 mcg by mouth daily.    . vitamin E 400 UNIT capsule Take 400 Units by mouth daily.    . nitroGLYCERIN (NITROSTAT) 0.4 MG SL tablet Place 1 tablet (0.4 mg total) under the tongue every 5 (five) minutes as needed for chest pain. (Patient not taking: Reported on 03/16/2020) 25 tablet 3     Discharge Medications: Please see discharge summary for a list of discharge medications.  Relevant Imaging Results:  Relevant Lab Results:   Additional Information SS# [id number], Rexene Alberts, RN

## 2020-07-06 NOTE — ED Notes (Signed)
Patient is rude to staff.

## 2020-07-06 NOTE — Evaluation (Signed)
Physical Therapy Evaluation Patient Details Name: Kristopher Gay MRN: [id number] DOB: 1951-02-22 Today's Date: 07/06/2020   History of Present Illness  69 year old male who presents to ED after a fall onto his knee while mowing the lawn.  He has a large effusion and a suspected patellar tendon rupture of the right knee.  There is no evidence of fracture.  His foot x-rays actually do reveal a minimally displaced fracture of the fourth metatarsal neck.  Patient is placed in a knee immobilizer.  I did discuss this with Dr. Mardelle Matte.  He was placed in a postop shoe and was advised to be nonweightbearing.  I did not feel that he would be able to tolerate a posterior splint in association with a knee immobilizer  Clinical Impression  Pt seen in ED with above diagnosis. Pt currently with functional limitations due to the deficits listed below (see PT Problem List). Pt will benefit from skilled PT to increase their independence and safety with mobility to allow discharge to the venue listed below.  Pt requiring assist to stand and poor ability to maintain NWB status. Pt reports inability to care for himself at home in current condition.  Pt educated on [place] and post op shoe as well as NWB.  Pt reports he will need assist upon d/c and very agreeable to SNF.  Pt also reports inability to don and adjust KI, "I can't reach."  Ice pack applied to right knee end of session.     Follow Up Recommendations SNF    Equipment Recommendations  None recommended by PT    Recommendations for Other Services       Precautions / Restrictions Precautions Precautions: Fall;Knee Required Braces or Orthoses: Knee Immobilizer - Right Knee Immobilizer - Right: On at all times Restrictions Weight Bearing Restrictions: Yes RLE Weight Bearing: Non weight bearing Other Position/Activity Restrictions: post op shoe  Post op shoe not present, RN notified    Mobility  Bed Mobility Overal bed mobility: Needs Assistance Bed  Mobility: Supine to Sit;Sit to Supine     Supine to sit: Min assist Sit to supine: Min assist   General bed mobility comments: assist required for R LE  Transfers Overall transfer level: Needs assistance Equipment used: Rolling walker (2 wheeled) Transfers: Sit to/from Stand Sit to Stand: Min assist;From elevated surface         General transfer comment: cues for UE and LE positioning, verbal cues for NWB R LE however pt applying some weight through heel, assist to rise and steady; pt unable to tolerate more then 20 seconds  Ambulation/Gait                Stairs            Wheelchair Mobility    Modified Rankin (Stroke Patients Only)       Balance Overall balance assessment: History of Falls                                           Pertinent Vitals/Pain Pain Assessment: Faces Faces Pain Scale: Hurts little more Pain Location: R knee and foot Pain Descriptors / Indicators: Tender;Aching;Discomfort Pain Intervention(s): Monitored during session;Limited activity within patient's tolerance    Home Living Family/patient expects to be discharged to:: Private residence Living Arrangements: Alone   Type of Home: House Home Access: Stairs to enter   [employer] of Steps:  2 Home Layout: One level Home Equipment: Walker - 2 wheels;Cane - single point;Crutches      Prior Function Level of Independence: Independent with assistive device(s)         Comments: uses cane     Hand Dominance        Extremity/Trunk Assessment        Lower Extremity Assessment Lower Extremity Assessment: RLE deficits/detail;LLE deficits/detail RLE Deficits / Details: R knee in immobilizer (adjusted to correct placement), no knee flexion with possible patellar tendon involvement (to f/u with ortho outpatient) LLE Deficits / Details: reports weak and arthritic L knee    Cervical / Trunk Assessment Cervical / Trunk Assessment: Normal   Communication   Communication: No difficulties  Cognition Arousal/Alertness: Awake/alert Behavior During Therapy: WFL for tasks assessed/performed Overall Cognitive Status: Within Functional Limits for tasks assessed                                        General Comments      Exercises     Assessment/Plan    PT Assessment Patient needs continued PT services  PT Problem List Decreased strength;Decreased mobility;Decreased balance;Decreased knowledge of use of DME;Pain;Decreased knowledge of precautions       PT Treatment Interventions DME instruction;Therapeutic activities;Gait training;Therapeutic exercise;Functional mobility training;Balance training;Patient/family education;Wheelchair mobility training    PT Goals (Current goals can be found in the Care Plan section)  Acute Rehab PT Goals PT Goal Formulation: With patient Time For Goal Achievement: 07/20/20 Potential to Achieve Goals: Good    Frequency Min 2X/week   Barriers to discharge        Co-evaluation               AM-PAC PT "6 Clicks" Mobility  Outcome Measure Help needed turning from your back to your side while in a flat bed without using bedrails?: A Little Help needed moving from lying on your back to sitting on the side of a flat bed without using bedrails?: A Little Help needed moving to and from a bed to a chair (including a wheelchair)?: A Little Help needed standing up from a chair using your arms (e.g., wheelchair or bedside chair)?: A Little Help needed to walk in hospital room?: A Lot Help needed climbing 3-5 steps with a railing? : A Lot 6 Click Score: 16    End of Session Equipment Utilized During Treatment: Gait belt;Right knee immobilizer Activity Tolerance: Patient limited by pain Patient left: in bed Nurse Communication: Precautions;Weight bearing status PT Visit Diagnosis: Other abnormalities of gait and mobility (R26.89)    Time: 6333-5456 PT Time  Calculation (min) (ACUTE ONLY): 28 min   Charges:   PT Evaluation $PT Eval Low Complexity: 1 Low PT Treatments $Therapeutic Activity: 8-22 mins       Jannette Spanner PT, DPT Acute Rehabilitation Services Pager: [phone] Office: [phone]  [place] Ram E 07/06/2020, 12:48 PM

## 2020-07-06 NOTE — NC FL2 (Deleted)
[place] MEDICAID FL2 LEVEL OF CARE SCREENING TOOL     IDENTIFICATION  Patient Name: Kristopher Gay Birthdate: [birth year] Sex: male Admission Date (Current Location): 07/05/2020  [facility] and [place] Number:  Herbalist and Address:  [facility]  [place] [address], [place]      Provider Number: [id number]  Attending Physician Name and Address:  Default, Provider, MD  Relative Name and Phone Number:       Current Level of Care: Hospital Recommended Level of Care: [place] Prior Approval Number:    Date Approved/Denied:   PASRR Number: [id number]  Discharge Plan: SNF    Current Diagnoses: Patient Active Problem List   Diagnosis Date Noted  . Ulnar neuropathy at elbow, right 05/03/2018  . Neuritis of right ulnar nerve 04/27/2018  . Maxillary sinusitis, acute 03/25/2018  . PAF (paroxysmal atrial fibrillation) ([place]) 02/20/2018  . Chronic right shoulder pain 05/15/2016  . Right knee pain 05/15/2016  . Adrenal adenoma 02/20/2016  . Chronic pain syndrome 10/12/2015  . Dysphagia, pharyngoesophageal phase 09/14/2014  . Other chronic postoperative pain 01/16/2014  . Allergy, insect bite 07/02/2012  . GERD (gastroesophageal reflux disease) 03/25/2012  . 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 Lumbosacral spondylosis 01/13/2012  . Chronic back pain 11/17/2011  . Seasonal and perennial allergic rhinitis 12/26/2010  . Chronic pain 04/16/2009  . COPD (chronic obstructive pulmonary disease) ([place]) 01/30/2008    Orientation RESPIRATION BLADDER Height & Weight     Self, Time, Situation, Place  Normal Continent Weight: 6'2" Height:  122 kg  BEHAVIORAL SYMPTOMS/MOOD NEUROLOGICAL BOWEL NUTRITION STATUS      Continent Diet (regular)  AMBULATORY STATUS COMMUNICATION OF NEEDS Skin   Extensive Assist   Normal                       Personal Care Assistance Level of Assistance  Bathing, Dressing, Feeding Bathing Assistance:  Independent Feeding assistance: Independent Dressing Assistance: Independent     Functional Limitations Info  Sight, Speech, Hearing Sight Info: Adequate Hearing Info: Adequate Speech Info: Adequate    SPECIAL CARE FACTORS FREQUENCY  PT (By licensed PT), OT (By licensed OT)     PT Frequency: 5x per week OT Frequency: 5x per week            Contractures Contractures Info: Not present    Additional Factors Info  Code Status, Allergies Code Status Info: full Allergies Info: bee venon, mold, smut, linzess           Current Medications (07/06/2020):  This is the current hospital active medication list No current facility-administered medications for this encounter.   Current Outpatient Medications  Medication Sig Dispense Refill  . ascorbic acid (VITAMIN C) 1000 MG tablet Take 1,000 mg by mouth daily.     Marland Kitchen aspirin EC 81 MG tablet Take 81 mg by mouth daily.    Marland Kitchen atorvastatin (LIPITOR) 40 MG tablet Take 1 tablet (40 mg total) by mouth daily. 90 tablet 3  . Calcium Carbonate-Vit D-Min (CALCIUM 1200 PO) Take 1 tablet by mouth daily.    . celecoxib (CELEBREX) 100 MG capsule TAKE 1 CAPSULE BY MOUTH TWICE DAILY (Patient taking differently: Take 100 mg by mouth 2 (two) times daily. ) 180 capsule 1  . Cholecalciferol (VITAMIN D3) 2000 units TABS Take 2,000 Units by mouth 2 (two) times daily.     Marland Kitchen DHEA 25 MG CAPS Take 1 capsule by mouth daily.     Marland Kitchen  DYMISTA 137-50 MCG/ACT SUSP SHAKE LIQUID AND USE 1 SPRAY IN EACH NOSTRIL TWICE DAILY AS NEEDED (Patient taking differently: Inhale 1 spray into the lungs 2 (two) times daily as needed (congestion). ) 23 g 2  . EPINEPHrine (EPIPEN 2-PAK) 0.3 mg/0.3 mL SOAJ injection Inject 0.3 mg into the muscle once.     . fluticasone (FLONASE) 50 MCG/ACT nasal spray SHAKE LIQUID AND USE 2 SPRAYS IN EACH NOSTRIL DAILY (Patient taking differently: Place 2 sprays into both nostrils daily. ) 16 g 12  . gabapentin (NEURONTIN) 300 MG capsule TAKE 2 CAPSULE BY  MOUTH IN THE MORNING, TAKE 1 CAPSULE AT MIDDAY AND 2 CAPSULE BY MOUTH AT BEDTIME (Patient taking differently: Take 300-600 mg by mouth See admin instructions. TAKE 2 CAPSULE BY MOUTH IN THE MORNING, TAKE 1 CAPSULE AT MIDDAY AND 2 CAPSULE BY MOUTH AT BEDTIME) 150 capsule 5  . glucosamine-chondroitin 500-400 MG tablet Take 1 tablet by mouth 2 (two) times daily.     . Magnesium 250 MG TABS Take 1 tablet by mouth daily.     . metoprolol succinate (TOPROL-XL) 25 MG 24 hr tablet TAKE 1 TABLET(25 MG) BY MOUTH DAILY (Patient taking differently: Take 25 mg by mouth daily. ) 90 tablet 0  . montelukast (SINGULAIR) 10 MG tablet TAKE 1 TABLET(10 MG) BY MOUTH AT BEDTIME (Patient taking differently: Take 10 mg by mouth at bedtime. ) 90 tablet 0  . Multiple Vitamin (MULTIVITAMIN) tablet Take 1 tablet by mouth 2 (two) times daily.     . Omega-3 Fatty Acids (FISH OIL ADULT GUMMIES PO) Take 1 capsule by mouth daily.     . traMADol (ULTRAM) 50 MG tablet TAKE 1 TABLET(50 MG) BY MOUTH TWICE DAILY (Patient taking differently: Take 50 mg by mouth 2 (two) times daily. ) 60 tablet 5  . TRELEGY ELLIPTA 100-62.5-25 MCG/INH AEPB INHALE 1 PUFF INTO THE LUNGS EVERY DAY (Patient taking differently: Inhale 1 puff into the lungs daily. ) 60 each 12  . vitamin B-12 (CYANOCOBALAMIN) 1000 MCG tablet Take 1,000 mcg by mouth daily.    . vitamin E 400 UNIT capsule Take 400 Units by mouth daily.    . nitroGLYCERIN (NITROSTAT) 0.4 MG SL tablet Place 1 tablet (0.4 mg total) under the tongue every 5 (five) minutes as needed for chest pain. (Patient not taking: Reported on 03/16/2020) 25 tablet 3     Discharge Medications: Please see discharge summary for a list of discharge medications.  Relevant Imaging Results:  Relevant Lab Results:   Additional Information SS# [id number] Rexene Alberts, RN

## 2020-07-06 NOTE — ED Notes (Signed)
Patient want to go to the bathroom to have a bowel movement.  Fenton Foy, Lysbeth Galas, RN and this writer assisted patient to the bathroom on the steady.  Once in there patient did not want to get on the toilet, stating that it was too low and he could not do it.  Patient made aware that his only other option would be a bedpan.  Patient taken in res A and assisted onto the bedpan.  Once finished he was moved back to the hallway bed.  Patient very frustrated because he is not moving around and sitting up but when he attempt to do that at his request he is unwilling to do what is asked.  Patient is stating it is too painful for him but he is also reusing all pain medication.

## 2020-07-06 NOTE — Discharge Planning (Signed)
Transitions of Care team consulted regarding skilled nursing facility placement vs home with home health.  Pt states he can not care for self at home alone.  RNCM placed PT consult for evaluation and recommendations to consider for the disposition plan.

## 2020-07-06 NOTE — ED Notes (Signed)
Patient complaining of pain from not moving, stating he has neuropathy.  Refuses pain medication.  Patient has his home meds at bedside and Wilson Singer, EDP is ok with him taking those.  Patient assisted to use the urinal and sat up with meal tray.

## 2020-07-06 NOTE — ED Notes (Signed)
Ortho tech made aware patient needs a post-op shoe.

## 2020-07-06 NOTE — ED Provider Notes (Signed)
Pt will be boarding awaiting snf placement.  Home meds ordered   Dorie Rank, MD 07/06/20 [phone]

## 2020-07-06 NOTE — ED Notes (Signed)
Patient given ice pack for his knee.

## 2020-07-06 NOTE — TOC Initial Note (Addendum)
Transition of Care [facility]) - Initial/Assessment Note    Patient Details  Name: Kristopher Gay MRN: [id number] Date of Birth: [birth year]  Transition [facility]) CM/SW Contact:    Erenest Rasher, RN Phone Number:  [phone] 07/06/2020, 4:52 PM  Clinical Narrative:                 TOC CM spoke to pt and lives at home alone. He is requesting SNF rehab. Gave permission to create [facility] nad fax referral for SNF. Will start auth with Aquilla Solian, reference number # [id number]. Faxed FL2, progress note, and facesheet to [employer]. Pt states he was independent prior to injury. He drove to his appts. Pt has completed his COVID vaccine. Will need COVID test. Pt is requesting [facility], contacted [facility] Admission Coordinator, [name] and left voice message.   Expected Discharge Plan: Skilled Nursing Facility Barriers to Discharge: SNF Pending bed offer   Patient Goals and CMS Choice Patient states their goals for this hospitalization and ongoing recovery are:: live alone and need rehab CMS Medicare.gov Compare Post Acute Care list provided to:: Patient Choice offered to / list presented to : Patient  Expected Discharge Plan and Services Expected Discharge Plan: [place] In-house Referral: Clinical Social Work Discharge Planning Services: CM Consult Post Acute Care Choice: [place]                                        Prior Living Arrangements/Services   Lives with:: Self Patient language and need for interpreter reviewed:: Yes        Need for Family Participation in Patient Care: Yes (Comment) Care giver support system in place?: Yes (comment) Current home services: DME (rolling walker, cane, crutches) Criminal Activity/Legal Involvement Pertinent to Current Situation/Hospitalization: No - Comment as needed  Activities of Daily Living      Permission Sought/Granted Permission sought to share information with : Case Manager, Facility  Sport and exercise psychologist, Guardian, PCP Permission granted to share information with : Yes, Verbal Permission Granted              Emotional Assessment   Attitude/Demeanor/Rapport: Engaged Affect (typically observed): Pleasant Orientation: : Oriented to Self, Oriented to Place, Oriented to  Time, Oriented to Situation   Psych Involvement: No (comment)  Admission diagnosis:  Fall , Right Knee Pain  Patient Active Problem List   Diagnosis Date Noted  . Ulnar neuropathy at elbow, right 05/03/2018  . Neuritis of right ulnar nerve 04/27/2018  . Maxillary sinusitis, acute 03/25/2018  . PAF (paroxysmal atrial fibrillation) ([place]) 02/20/2018  . Chronic right shoulder pain 05/15/2016  . Right knee pain 05/15/2016  . Adrenal adenoma 02/20/2016  . Chronic pain syndrome 10/12/2015  . Dysphagia, pharyngoesophageal phase 09/14/2014  . Other chronic postoperative pain 01/16/2014  . Allergy, insect bite 07/02/2012  . GERD (gastroesophageal reflux disease) 03/25/2012  . 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 Lumbosacral spondylosis 01/13/2012  . Chronic back pain 11/17/2011  . Seasonal and perennial allergic rhinitis 12/26/2010  . Chronic pain 04/16/2009  . COPD (chronic obstructive pulmonary disease) ([place]) 01/30/2008   PCP:  [name], MD Pharmacy:   [facility] [place], [address] [place] [address] Phone: [phone] Fax: [phone]     Social Determinants of Health (SDOH)  Interventions    Readmission Risk Interventions No flowsheet data found.

## 2020-07-07 DIAGNOSIS — S92341A Displaced fracture of fourth metatarsal bone, right foot, initial encounter for closed fracture: Secondary | ICD-10-CM | POA: Diagnosis not present

## 2020-07-07 DIAGNOSIS — S86812A Strain of other muscle(s) and tendon(s) at lower leg level, left leg, initial encounter: Secondary | ICD-10-CM | POA: Diagnosis not present

## 2020-07-07 DIAGNOSIS — M25461 Effusion, right knee: Secondary | ICD-10-CM | POA: Diagnosis not present

## 2020-07-07 NOTE — Progress Notes (Addendum)
2:30pm: CSW received bed offer from [facility]. CSW attempted to reach admissions at [facility] without success.  Patient's insurance authorization has been approved for  07/07/2020 through 07/10/2020. Approval number is [id number].  12:30pm: CSW attempted to reach [place] at [facility] without success - currently awaiting return call. CSW also sent text message to [facility] to request a review of this patient.  Madilyn Fireman, MSW, LCSW-A Transitions of Care  Clinical Social Worker  [facility] Emergency Departments  Medical ICU [phone]

## 2020-07-07 NOTE — ED Provider Notes (Signed)
Emergency Medicine Observation Re-evaluation Note  Kristopher Gay is a 69 y.o. male, seen on rounds today.  Pt initially presented to the ED for complaints of Fall Currently, the patient is awaiting SNF placement after knee injury.   Physical Exam  BP (!) 146/76 (BP Location: Right Arm)   Pulse 66   Temp 98.8 F (37.1 C) (Oral)   Resp 18   SpO2 94%  Physical Exam General: Resting comfortably Lungs: no distress MSK: knee in immobilizer  ED Course / MDM  EKG:    I have reviewed the labs performed to date as well as medications administered while in observation.  Recent changes in the last 24 hours include none.  Plan  Current plan is for SNF placement for PT from knee injury. Patient is not under full IVC at this time.   Truddie Hidden, MD 07/07/20 [phone]

## 2020-07-08 DIAGNOSIS — S86812A Strain of other muscle(s) and tendon(s) at lower leg level, left leg, initial encounter: Secondary | ICD-10-CM | POA: Diagnosis not present

## 2020-07-08 DIAGNOSIS — M25461 Effusion, right knee: Secondary | ICD-10-CM | POA: Diagnosis not present

## 2020-07-08 DIAGNOSIS — S92341A Displaced fracture of fourth metatarsal bone, right foot, initial encounter for closed fracture: Secondary | ICD-10-CM | POA: Diagnosis not present

## 2020-07-08 NOTE — Progress Notes (Signed)
TOC CSW received a call from [employer] [phone].  [facility] is accepting pt on 07/09/2020 before 12pm, due to COVID status of vaccinated.  Pt stated he was vaccinated 12/25/2019 and January 23, 2020.  Kirstin will pass this information of acceptance to Kenisha/Admissions at [facility].  CSW follow up with dc needs on 07/09/2020 to [facility] with [name] [phone].

## 2020-07-08 NOTE — Progress Notes (Signed)
TOC CSW has attempted to contact Kenisha/Admissions at [facility] via phone and text.  CSW left HIPPA compliant message with my contact information on voicemail and through text message.  CSW has asked [name] to assess pt for admission.  CSW will continue to follow for dc needs.  Hillel Card Tarpley-Carter, MSW, LCSW-A                  [facility] ED Transitions of CareClinical Social Worker [email or web address] [phone]

## 2020-07-08 NOTE — ED Provider Notes (Signed)
Emergency Medicine Observation Re-evaluation Note  ZAEVION PARKE is a 69 y.o. male, seen on rounds today.  Pt initially presented to the ED for complaints of Fall Currently, the patient is resting comfortably.  Physical Exam  BP 115/72   Pulse (!) 57   Temp 98.7 F (37.1 C) (Oral)   Resp 16   SpO2 92%  Physical Exam General: Resting comfortably Lungs: No distress Psych: Calm and coopertive  ED Course / MDM  EKG:    I have reviewed the labs performed to date as well as medications administered while in observation.  Recent changes in the last 24 hours include none.  Plan  Current plan is for SNF placement for knee injury. Has been accepted but transportation not yet arranged.  Patient is not under full IVC at this time.   Truddie Hidden, MD 07/08/20 [phone]

## 2020-07-08 NOTE — Progress Notes (Signed)
TOC CSW reached out to [employer] [phone]. CSW left HIPPA compliant message with my contact information in regards to pt.  CSW will continue to follow for dc needs.  Kiaria Quinnell Tarpley-Carter, MSW, [place] ED Transitions of CareClinical Social Worker [email or web address] [phone]

## 2020-07-08 NOTE — ED Notes (Signed)
Pt would like evening dose of 600mg  gabapentin around 2000/2100.

## 2020-07-09 DIAGNOSIS — M479 Spondylosis, unspecified: Secondary | ICD-10-CM | POA: Diagnosis not present

## 2020-07-09 DIAGNOSIS — M66261 Spontaneous rupture of extensor tendons, right lower leg: Secondary | ICD-10-CM | POA: Diagnosis not present

## 2020-07-09 DIAGNOSIS — E782 Mixed hyperlipidemia: Secondary | ICD-10-CM | POA: Diagnosis not present

## 2020-07-09 DIAGNOSIS — M7041 Prepatellar bursitis, right knee: Secondary | ICD-10-CM | POA: Diagnosis not present

## 2020-07-09 DIAGNOSIS — K5901 Slow transit constipation: Secondary | ICD-10-CM | POA: Diagnosis not present

## 2020-07-09 DIAGNOSIS — S92514A Nondisplaced fracture of proximal phalanx of right lesser toe(s), initial encounter for closed fracture: Secondary | ICD-10-CM | POA: Diagnosis not present

## 2020-07-09 DIAGNOSIS — S8001XA Contusion of right knee, initial encounter: Secondary | ICD-10-CM | POA: Diagnosis not present

## 2020-07-09 DIAGNOSIS — Z888 Allergy status to other drugs, medicaments and biological substances status: Secondary | ICD-10-CM | POA: Diagnosis not present

## 2020-07-09 DIAGNOSIS — M79672 Pain in left foot: Secondary | ICD-10-CM | POA: Diagnosis not present

## 2020-07-09 DIAGNOSIS — S83421A Sprain of lateral collateral ligament of right knee, initial encounter: Secondary | ICD-10-CM | POA: Diagnosis not present

## 2020-07-09 DIAGNOSIS — Z23 Encounter for immunization: Secondary | ICD-10-CM | POA: Diagnosis not present

## 2020-07-09 DIAGNOSIS — M66251 Spontaneous rupture of extensor tendons, right thigh: Secondary | ICD-10-CM | POA: Diagnosis not present

## 2020-07-09 DIAGNOSIS — M7989 Other specified soft tissue disorders: Secondary | ICD-10-CM | POA: Diagnosis present

## 2020-07-09 DIAGNOSIS — G8918 Other acute postprocedural pain: Secondary | ICD-10-CM | POA: Diagnosis not present

## 2020-07-09 DIAGNOSIS — G629 Polyneuropathy, unspecified: Secondary | ICD-10-CM | POA: Diagnosis not present

## 2020-07-09 DIAGNOSIS — M25461 Effusion, right knee: Secondary | ICD-10-CM | POA: Diagnosis not present

## 2020-07-09 DIAGNOSIS — S76111D Strain of right quadriceps muscle, fascia and tendon, subsequent encounter: Secondary | ICD-10-CM | POA: Diagnosis not present

## 2020-07-09 DIAGNOSIS — F39 Unspecified mood [affective] disorder: Secondary | ICD-10-CM | POA: Diagnosis not present

## 2020-07-09 DIAGNOSIS — U071 COVID-19: Secondary | ICD-10-CM | POA: Diagnosis not present

## 2020-07-09 DIAGNOSIS — Z7401 Bed confinement status: Secondary | ICD-10-CM | POA: Diagnosis not present

## 2020-07-09 DIAGNOSIS — S92309A Fracture of unspecified metatarsal bone(s), unspecified foot, initial encounter for closed fracture: Secondary | ICD-10-CM | POA: Diagnosis not present

## 2020-07-09 DIAGNOSIS — M1388 Other specified arthritis, other site: Secondary | ICD-10-CM | POA: Diagnosis not present

## 2020-07-09 DIAGNOSIS — M2550 Pain in unspecified joint: Secondary | ICD-10-CM | POA: Diagnosis not present

## 2020-07-09 DIAGNOSIS — Z789 Other specified health status: Secondary | ICD-10-CM | POA: Diagnosis not present

## 2020-07-09 DIAGNOSIS — M79675 Pain in left toe(s): Secondary | ICD-10-CM | POA: Diagnosis not present

## 2020-07-09 DIAGNOSIS — I10 Essential (primary) hypertension: Secondary | ICD-10-CM | POA: Diagnosis not present

## 2020-07-09 DIAGNOSIS — J45909 Unspecified asthma, uncomplicated: Secondary | ICD-10-CM | POA: Diagnosis not present

## 2020-07-09 DIAGNOSIS — S86812A Strain of other muscle(s) and tendon(s) at lower leg level, left leg, initial encounter: Secondary | ICD-10-CM | POA: Diagnosis not present

## 2020-07-09 DIAGNOSIS — L0390 Cellulitis, unspecified: Secondary | ICD-10-CM | POA: Diagnosis not present

## 2020-07-09 DIAGNOSIS — M25571 Pain in right ankle and joints of right foot: Secondary | ICD-10-CM | POA: Diagnosis not present

## 2020-07-09 DIAGNOSIS — S92341A Displaced fracture of fourth metatarsal bone, right foot, initial encounter for closed fracture: Secondary | ICD-10-CM | POA: Diagnosis not present

## 2020-07-09 DIAGNOSIS — I480 Paroxysmal atrial fibrillation: Secondary | ICD-10-CM | POA: Diagnosis not present

## 2020-07-09 DIAGNOSIS — R52 Pain, unspecified: Secondary | ICD-10-CM | POA: Diagnosis not present

## 2020-07-09 DIAGNOSIS — S92309D Fracture of unspecified metatarsal bone(s), unspecified foot, subsequent encounter for fracture with routine healing: Secondary | ICD-10-CM | POA: Diagnosis not present

## 2020-07-09 DIAGNOSIS — J439 Emphysema, unspecified: Secondary | ICD-10-CM | POA: Diagnosis not present

## 2020-07-09 DIAGNOSIS — I4891 Unspecified atrial fibrillation: Secondary | ICD-10-CM | POA: Diagnosis not present

## 2020-07-09 DIAGNOSIS — M25561 Pain in right knee: Secondary | ICD-10-CM | POA: Diagnosis present

## 2020-07-09 DIAGNOSIS — M6281 Muscle weakness (generalized): Secondary | ICD-10-CM | POA: Diagnosis not present

## 2020-07-09 DIAGNOSIS — Z7982 Long term (current) use of aspirin: Secondary | ICD-10-CM | POA: Diagnosis not present

## 2020-07-09 DIAGNOSIS — Z20822 Contact with and (suspected) exposure to covid-19: Secondary | ICD-10-CM | POA: Diagnosis not present

## 2020-07-09 DIAGNOSIS — Z79899 Other long term (current) drug therapy: Secondary | ICD-10-CM | POA: Diagnosis not present

## 2020-07-09 DIAGNOSIS — R262 Difficulty in walking, not elsewhere classified: Secondary | ICD-10-CM | POA: Diagnosis not present

## 2020-07-09 DIAGNOSIS — Z91030 Bee allergy status: Secondary | ICD-10-CM | POA: Diagnosis not present

## 2020-07-09 DIAGNOSIS — R41841 Cognitive communication deficit: Secondary | ICD-10-CM | POA: Diagnosis not present

## 2020-07-09 DIAGNOSIS — G8929 Other chronic pain: Secondary | ICD-10-CM | POA: Diagnosis not present

## 2020-07-09 DIAGNOSIS — Z6832 Body mass index (BMI) 32.0-32.9, adult: Secondary | ICD-10-CM | POA: Diagnosis not present

## 2020-07-09 DIAGNOSIS — R5381 Other malaise: Secondary | ICD-10-CM | POA: Diagnosis not present

## 2020-07-09 DIAGNOSIS — S86819D Strain of other muscle(s) and tendon(s) at lower leg level, unspecified leg, subsequent encounter: Secondary | ICD-10-CM | POA: Diagnosis not present

## 2020-07-09 DIAGNOSIS — M79604 Pain in right leg: Secondary | ICD-10-CM | POA: Diagnosis present

## 2020-07-09 DIAGNOSIS — S86819A Strain of other muscle(s) and tendon(s) at lower leg level, unspecified leg, initial encounter: Secondary | ICD-10-CM | POA: Diagnosis not present

## 2020-07-09 DIAGNOSIS — S76111A Strain of right quadriceps muscle, fascia and tendon, initial encounter: Secondary | ICD-10-CM | POA: Diagnosis not present

## 2020-07-09 DIAGNOSIS — S83411A Sprain of medial collateral ligament of right knee, initial encounter: Secondary | ICD-10-CM | POA: Diagnosis not present

## 2020-07-09 DIAGNOSIS — J449 Chronic obstructive pulmonary disease, unspecified: Secondary | ICD-10-CM | POA: Diagnosis not present

## 2020-07-09 DIAGNOSIS — G6289 Other specified polyneuropathies: Secondary | ICD-10-CM | POA: Diagnosis not present

## 2020-07-09 DIAGNOSIS — E669 Obesity, unspecified: Secondary | ICD-10-CM | POA: Diagnosis not present

## 2020-07-09 DIAGNOSIS — J984 Other disorders of lung: Secondary | ICD-10-CM | POA: Diagnosis not present

## 2020-07-09 NOTE — TOC Transition Note (Signed)
Transition of Care [facility]) - CM/SW Discharge Note  Patient Details  Name: Kristopher Gay MRN: [id number] Date of Birth: [birth year]  Transition of Care [facility]) CM/SW Contact:  Sherie Don, LCSW Phone Number: 07/09/2020, 9:55 AM  Clinical Narrative: CSW followed up with [name] [phone]) in admissions with [facility]. Per [name], patient can discharge to the facility today and his negative COVID test on 07/06/20 is sufficient as he is vaccinated. The number to call for report is [phone] and the patient will go to room 1204P. RN updated. [facility] to pull AVS once completed. TOC signing off.  Final next level of care: Skilled Nursing Facility Barriers to Discharge: Barriers Resolved  Patient Goals and CMS Choice Patient states their goals for this hospitalization and ongoing recovery are:: Discharge to [facility] Medicare.gov Compare Post Acute Care list provided to:: Patient Choice offered to / list presented to : Patient  Discharge Placement PASRR number recieved: 07/06/20       Patient chooses bed at: [facility] Patient to be transferred to facility by: [facility]  Discharge Plan and Services In-house Referral: Clinical Social Work Discharge Planning Services: CM Consult Post Acute Care Choice: [place]          DME Arranged: N/A DME Agency: NA HH Arranged: NA [place] Agency: NA  Readmission Risk Interventions No flowsheet data found.

## 2020-07-09 NOTE — ED Notes (Signed)
[facility] called for transport.  

## 2020-07-09 NOTE — Progress Notes (Signed)
Physical Therapy Treatment Patient Details Name: Kristopher Gay MRN: [id number] DOB: 21-Sep-1951 Today's Date: 07/09/2020    History of Present Illness 69 year old male who presents to ED after a fall onto his knee while mowing the lawn.  He has a large effusion and a suspected patellar tendon rupture of the right knee.  There is no evidence of fracture.  His foot x-rays actually do reveal a minimally displaced fracture of the fourth metatarsal neck.  Patient is placed in a knee immobilizer.  I did discuss this with Dr. Mardelle Matte.  He was placed in a postop shoe and was advised to be nonweightbearing.  I did not feel that he would be able to tolerate a posterior splint in association with a knee immobilizer    PT Comments    Treatment limited due to  Right knee pain, noted quite edematous. Instructed in repositioning of KI on right leg to ensure support when attempting to sit up. Patient had many questions about going to SNF and an ortho MD appointment on Thursday.Deferred patient to discuss with MD. Continue mobility attempts while in hospital.    Follow Up Recommendations  SNF     Equipment Recommendations  None recommended by PT    Recommendations for Other Services       Precautions / Restrictions Precautions Precautions: Fall;Knee Required Braces or Orthoses: Knee Immobilizer - Right;Other Brace Knee Immobilizer - Right: On at all times Other Brace: right postop shoe Restrictions Weight Bearing Restrictions: Yes RLE Weight Bearing: Non weight bearing    Mobility  Bed Mobility               General bed mobility comments: deferred due to increased pain with mobility and patient requests to not move. reinstructed in [place] position as KI was open and Ice in place.  Transfers                    Ambulation/Gait                 Stairs             Wheelchair Mobility    Modified Rankin (Stroke Patients Only)       Balance                                             Cognition Arousal/Alertness: Awake/alert                                            Exercises      General Comments        Pertinent Vitals/Pain Faces Pain Scale: Hurts little more Pain Location: R knee and foot Pain Descriptors / Indicators: Tender;Aching;Discomfort Pain Intervention(s): Monitored during session    Home Living                      Prior Function            PT Goals (current goals can now be found in the care plan section) Progress towards PT goals: Progressing toward goals    Frequency    Min 2X/week      PT Plan Current plan remains appropriate    Co-evaluation  AM-PAC PT "6 Clicks" Mobility   Outcome Measure  Help needed turning from your back to your side while in a flat bed without using bedrails?: A Little Help needed moving from lying on your back to sitting on the side of a flat bed without using bedrails?: A Little Help needed moving to and from a bed to a chair (including a wheelchair)?: A Lot Help needed standing up from a chair using your arms (e.g., wheelchair or bedside chair)?: A Lot Help needed to walk in hospital room?: Total Help needed climbing 3-5 steps with a railing? : Total 6 Click Score: 12    End of Session   Activity Tolerance: Patient limited by pain Patient left: in bed Nurse Communication: Precautions;Weight bearing status PT Visit Diagnosis: Other abnormalities of gait and mobility (R26.89)     Time: 9741-6384 PT Time Calculation (min) (ACUTE ONLY): 25 min  Charges:  $Therapeutic Activity: 8-22 mins $Self Care/Home Management: [place] Pager [phone] Office [phone]    Claretha Cooper 07/09/2020, 10:31 AM

## 2020-07-09 NOTE — ED Notes (Signed)
Attempted report to [facility] x1

## 2020-07-09 NOTE — ED Notes (Signed)
Pt helped back in bed and given 2 ice packs and a cup of ice water as requested  Counselling psychologist

## 2020-07-09 NOTE — ED Notes (Signed)
Attempted report x2, no answer at receiving facility.

## 2020-07-10 DIAGNOSIS — M479 Spondylosis, unspecified: Secondary | ICD-10-CM | POA: Diagnosis not present

## 2020-07-10 DIAGNOSIS — G8929 Other chronic pain: Secondary | ICD-10-CM | POA: Diagnosis not present

## 2020-07-10 DIAGNOSIS — S92309A Fracture of unspecified metatarsal bone(s), unspecified foot, initial encounter for closed fracture: Secondary | ICD-10-CM | POA: Diagnosis not present

## 2020-07-10 DIAGNOSIS — M1388 Other specified arthritis, other site: Secondary | ICD-10-CM | POA: Diagnosis not present

## 2020-07-10 DIAGNOSIS — S86819A Strain of other muscle(s) and tendon(s) at lower leg level, unspecified leg, initial encounter: Secondary | ICD-10-CM | POA: Diagnosis not present

## 2020-07-10 DIAGNOSIS — G6289 Other specified polyneuropathies: Secondary | ICD-10-CM | POA: Diagnosis not present

## 2020-07-10 DIAGNOSIS — I480 Paroxysmal atrial fibrillation: Secondary | ICD-10-CM | POA: Diagnosis not present

## 2020-07-10 DIAGNOSIS — J984 Other disorders of lung: Secondary | ICD-10-CM | POA: Diagnosis not present

## 2020-07-10 DIAGNOSIS — K5901 Slow transit constipation: Secondary | ICD-10-CM | POA: Diagnosis not present

## 2020-07-11 DIAGNOSIS — R52 Pain, unspecified: Secondary | ICD-10-CM | POA: Diagnosis not present

## 2020-07-11 DIAGNOSIS — M6281 Muscle weakness (generalized): Secondary | ICD-10-CM | POA: Diagnosis not present

## 2020-07-11 DIAGNOSIS — R262 Difficulty in walking, not elsewhere classified: Secondary | ICD-10-CM | POA: Diagnosis not present

## 2020-07-11 DIAGNOSIS — I10 Essential (primary) hypertension: Secondary | ICD-10-CM | POA: Diagnosis not present

## 2020-07-11 DIAGNOSIS — I4891 Unspecified atrial fibrillation: Secondary | ICD-10-CM | POA: Diagnosis not present

## 2020-07-11 DIAGNOSIS — E782 Mixed hyperlipidemia: Secondary | ICD-10-CM | POA: Diagnosis not present

## 2020-07-11 DIAGNOSIS — G629 Polyneuropathy, unspecified: Secondary | ICD-10-CM | POA: Diagnosis not present

## 2020-07-11 DIAGNOSIS — G8929 Other chronic pain: Secondary | ICD-10-CM | POA: Diagnosis not present

## 2020-07-11 DIAGNOSIS — J449 Chronic obstructive pulmonary disease, unspecified: Secondary | ICD-10-CM | POA: Diagnosis not present

## 2020-07-12 ENCOUNTER — Ambulatory Visit (HOSPITAL_COMMUNITY)
Admission: RE | Admit: 2020-07-12 | Discharge: 2020-07-12 | Disposition: A | Discharge status: Home / Self Care | Payer: Medicare PPO | Source: Ambulatory Visit | Attending: Orthopedic Surgery | Admitting: Orthopedic Surgery

## 2020-07-12 ENCOUNTER — Other Ambulatory Visit: Payer: Self-pay

## 2020-07-12 ENCOUNTER — Other Ambulatory Visit (HOSPITAL_COMMUNITY): Payer: Self-pay | Admitting: Orthopedic Surgery

## 2020-07-12 DIAGNOSIS — M79604 Pain in right leg: Secondary | ICD-10-CM | POA: Insufficient documentation

## 2020-07-12 DIAGNOSIS — S76111A Strain of right quadriceps muscle, fascia and tendon, initial encounter: Secondary | ICD-10-CM | POA: Diagnosis not present

## 2020-07-12 DIAGNOSIS — M7989 Other specified soft tissue disorders: Secondary | ICD-10-CM | POA: Insufficient documentation

## 2020-07-12 DIAGNOSIS — S92514A Nondisplaced fracture of proximal phalanx of right lesser toe(s), initial encounter for closed fracture: Secondary | ICD-10-CM | POA: Diagnosis not present

## 2020-07-13 DIAGNOSIS — R52 Pain, unspecified: Secondary | ICD-10-CM | POA: Diagnosis not present

## 2020-07-13 DIAGNOSIS — R262 Difficulty in walking, not elsewhere classified: Secondary | ICD-10-CM | POA: Diagnosis not present

## 2020-07-13 DIAGNOSIS — I4891 Unspecified atrial fibrillation: Secondary | ICD-10-CM | POA: Diagnosis not present

## 2020-07-13 DIAGNOSIS — J449 Chronic obstructive pulmonary disease, unspecified: Secondary | ICD-10-CM | POA: Diagnosis not present

## 2020-07-13 DIAGNOSIS — E782 Mixed hyperlipidemia: Secondary | ICD-10-CM | POA: Diagnosis not present

## 2020-07-13 DIAGNOSIS — M6281 Muscle weakness (generalized): Secondary | ICD-10-CM | POA: Diagnosis not present

## 2020-07-13 DIAGNOSIS — G629 Polyneuropathy, unspecified: Secondary | ICD-10-CM | POA: Diagnosis not present

## 2020-07-13 DIAGNOSIS — G8929 Other chronic pain: Secondary | ICD-10-CM | POA: Diagnosis not present

## 2020-07-13 DIAGNOSIS — I10 Essential (primary) hypertension: Secondary | ICD-10-CM | POA: Diagnosis not present

## 2020-07-16 ENCOUNTER — Other Ambulatory Visit: Payer: Self-pay | Admitting: Orthopedic Surgery

## 2020-07-16 DIAGNOSIS — M25561 Pain in right knee: Secondary | ICD-10-CM

## 2020-07-16 DIAGNOSIS — I4891 Unspecified atrial fibrillation: Secondary | ICD-10-CM | POA: Diagnosis not present

## 2020-07-16 DIAGNOSIS — E782 Mixed hyperlipidemia: Secondary | ICD-10-CM | POA: Diagnosis not present

## 2020-07-16 DIAGNOSIS — S92309D Fracture of unspecified metatarsal bone(s), unspecified foot, subsequent encounter for fracture with routine healing: Secondary | ICD-10-CM | POA: Diagnosis not present

## 2020-07-16 DIAGNOSIS — L0390 Cellulitis, unspecified: Secondary | ICD-10-CM | POA: Diagnosis not present

## 2020-07-16 DIAGNOSIS — K5901 Slow transit constipation: Secondary | ICD-10-CM | POA: Diagnosis not present

## 2020-07-16 DIAGNOSIS — S86819D Strain of other muscle(s) and tendon(s) at lower leg level, unspecified leg, subsequent encounter: Secondary | ICD-10-CM | POA: Diagnosis not present

## 2020-07-16 DIAGNOSIS — G629 Polyneuropathy, unspecified: Secondary | ICD-10-CM | POA: Diagnosis not present

## 2020-07-16 DIAGNOSIS — F39 Unspecified mood [affective] disorder: Secondary | ICD-10-CM | POA: Diagnosis not present

## 2020-07-16 DIAGNOSIS — G8929 Other chronic pain: Secondary | ICD-10-CM | POA: Diagnosis not present

## 2020-07-16 DIAGNOSIS — M6281 Muscle weakness (generalized): Secondary | ICD-10-CM | POA: Diagnosis not present

## 2020-07-16 DIAGNOSIS — G6289 Other specified polyneuropathies: Secondary | ICD-10-CM | POA: Diagnosis not present

## 2020-07-16 DIAGNOSIS — I10 Essential (primary) hypertension: Secondary | ICD-10-CM | POA: Diagnosis not present

## 2020-07-16 DIAGNOSIS — R52 Pain, unspecified: Secondary | ICD-10-CM | POA: Diagnosis not present

## 2020-07-16 DIAGNOSIS — J449 Chronic obstructive pulmonary disease, unspecified: Secondary | ICD-10-CM | POA: Diagnosis not present

## 2020-07-16 DIAGNOSIS — R262 Difficulty in walking, not elsewhere classified: Secondary | ICD-10-CM | POA: Diagnosis not present

## 2020-07-18 DIAGNOSIS — M6281 Muscle weakness (generalized): Secondary | ICD-10-CM | POA: Diagnosis not present

## 2020-07-18 DIAGNOSIS — I10 Essential (primary) hypertension: Secondary | ICD-10-CM | POA: Diagnosis not present

## 2020-07-18 DIAGNOSIS — E782 Mixed hyperlipidemia: Secondary | ICD-10-CM | POA: Diagnosis not present

## 2020-07-18 DIAGNOSIS — R262 Difficulty in walking, not elsewhere classified: Secondary | ICD-10-CM | POA: Diagnosis not present

## 2020-07-18 DIAGNOSIS — G8929 Other chronic pain: Secondary | ICD-10-CM | POA: Diagnosis not present

## 2020-07-18 DIAGNOSIS — J449 Chronic obstructive pulmonary disease, unspecified: Secondary | ICD-10-CM | POA: Diagnosis not present

## 2020-07-18 DIAGNOSIS — G629 Polyneuropathy, unspecified: Secondary | ICD-10-CM | POA: Diagnosis not present

## 2020-07-18 DIAGNOSIS — I4891 Unspecified atrial fibrillation: Secondary | ICD-10-CM | POA: Diagnosis not present

## 2020-07-18 DIAGNOSIS — R52 Pain, unspecified: Secondary | ICD-10-CM | POA: Diagnosis not present

## 2020-07-23 ENCOUNTER — Ambulatory Visit (HOSPITAL_COMMUNITY)
Admission: RE | Admit: 2020-07-23 | Discharge: 2020-07-23 | Disposition: A | Discharge status: Home / Self Care | Payer: Medicare PPO | Source: Ambulatory Visit | Attending: Orthopedic Surgery | Admitting: Orthopedic Surgery

## 2020-07-23 ENCOUNTER — Other Ambulatory Visit: Payer: Self-pay

## 2020-07-23 DIAGNOSIS — J449 Chronic obstructive pulmonary disease, unspecified: Secondary | ICD-10-CM | POA: Diagnosis not present

## 2020-07-23 DIAGNOSIS — S8001XA Contusion of right knee, initial encounter: Secondary | ICD-10-CM | POA: Diagnosis not present

## 2020-07-23 DIAGNOSIS — R262 Difficulty in walking, not elsewhere classified: Secondary | ICD-10-CM | POA: Diagnosis not present

## 2020-07-23 DIAGNOSIS — M25561 Pain in right knee: Secondary | ICD-10-CM | POA: Diagnosis not present

## 2020-07-23 DIAGNOSIS — E782 Mixed hyperlipidemia: Secondary | ICD-10-CM | POA: Diagnosis not present

## 2020-07-23 DIAGNOSIS — I4891 Unspecified atrial fibrillation: Secondary | ICD-10-CM | POA: Diagnosis not present

## 2020-07-23 DIAGNOSIS — I10 Essential (primary) hypertension: Secondary | ICD-10-CM | POA: Diagnosis not present

## 2020-07-23 DIAGNOSIS — R52 Pain, unspecified: Secondary | ICD-10-CM | POA: Diagnosis not present

## 2020-07-23 DIAGNOSIS — G629 Polyneuropathy, unspecified: Secondary | ICD-10-CM | POA: Diagnosis not present

## 2020-07-23 DIAGNOSIS — M6281 Muscle weakness (generalized): Secondary | ICD-10-CM | POA: Diagnosis not present

## 2020-07-23 DIAGNOSIS — G8929 Other chronic pain: Secondary | ICD-10-CM | POA: Diagnosis not present

## 2020-07-25 DIAGNOSIS — S76111D Strain of right quadriceps muscle, fascia and tendon, subsequent encounter: Secondary | ICD-10-CM | POA: Diagnosis not present
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
## 2020-07-26 DIAGNOSIS — S86819A Strain of other muscle(s) and tendon(s) at lower leg level, unspecified leg, initial encounter: Secondary | ICD-10-CM | POA: Diagnosis not present

## 2020-07-26 DIAGNOSIS — Z789 Other specified health status: Secondary | ICD-10-CM | POA: Diagnosis not present

## 2020-07-26 DIAGNOSIS — L0390 Cellulitis, unspecified: Secondary | ICD-10-CM | POA: Diagnosis not present

## 2020-07-26 DIAGNOSIS — M25571 Pain in right ankle and joints of right foot: Secondary | ICD-10-CM | POA: Diagnosis not present

## 2020-07-26 NOTE — Progress Notes (Signed)
COVID Vaccine Completed: Date COVID Vaccine completed: COVID vaccine manufacturer: [place]   PCP - Jill Alexanders, MD Cardiologist - Lyman Bishop, MD LOV 01/25/20 f/u 1 year  Chest x-ray -  EKG - 01-25-20 Stress Test - 02-22-18 ECHO -  Cardiac Cath -  Pacemaker/ICD device last checked:  Sleep Study -  CPAP -   Fasting Blood Sugar -  Checks Blood Sugar _____ times a day  Blood Thinner Instructions: Aspirin Instructions: Last Dose:  Anesthesia review:   Patient denies shortness of breath, fever, cough and chest pain at PAT appointment   Patient verbalized understanding of instructions that were given to them at the PAT appointment. Patient was also instructed that they will need to review over the PAT instructions again at home before surgery.

## 2020-07-27 ENCOUNTER — Other Ambulatory Visit (HOSPITAL_COMMUNITY): Payer: Medicare PPO

## 2020-07-27 ENCOUNTER — Encounter (HOSPITAL_COMMUNITY): Admission: RE | Admit: 2020-07-27 | Payer: Medicare PPO | Source: Ambulatory Visit

## 2020-07-27 DIAGNOSIS — G629 Polyneuropathy, unspecified: Secondary | ICD-10-CM | POA: Diagnosis not present

## 2020-07-27 DIAGNOSIS — R262 Difficulty in walking, not elsewhere classified: Secondary | ICD-10-CM | POA: Diagnosis not present

## 2020-07-27 DIAGNOSIS — M6281 Muscle weakness (generalized): Secondary | ICD-10-CM | POA: Diagnosis not present

## 2020-07-27 DIAGNOSIS — I10 Essential (primary) hypertension: Secondary | ICD-10-CM | POA: Diagnosis not present

## 2020-07-27 DIAGNOSIS — I4891 Unspecified atrial fibrillation: Secondary | ICD-10-CM | POA: Diagnosis not present

## 2020-07-27 DIAGNOSIS — R52 Pain, unspecified: Secondary | ICD-10-CM | POA: Diagnosis not present

## 2020-07-27 DIAGNOSIS — E782 Mixed hyperlipidemia: Secondary | ICD-10-CM | POA: Diagnosis not present

## 2020-07-27 DIAGNOSIS — G8929 Other chronic pain: Secondary | ICD-10-CM | POA: Diagnosis not present

## 2020-07-27 DIAGNOSIS — J449 Chronic obstructive pulmonary disease, unspecified: Secondary | ICD-10-CM | POA: Diagnosis not present

## 2020-07-27 NOTE — Progress Notes (Signed)
Preop instructions for:    SANTIEL TOPPER  Date of Birth   04/19/2051                         Date of Procedure:   07/31/20    Doctor: Dr. Percell Miller  Time to arrive at [facility]: 7:00 AM     Report to: Admitting   Procedure:Right Quadriceps Tendon Repair  You can have Clear Liquids from Midnight until 6:00 AM.After 6:00 AM, nothing until after surgery.  CLEAR LIQUID DIET   Foods Allowed                                                                  Coffee and tea, regular and decaf                             liquids that you cannot  Plain Jell-O any favor except red or purple                                           see through such as: Fruit ices (not with fruit pulp)                                    Popsicles                                     Carbonated beverages, regular and diet                                    Cranberry, grape and apple juices Sports drinks like Gatorade Lightly seasoned clear broth or consume(fat free) Sugar, honey syrup  Take these morning medications only with sips of water. Metoprolol, and Gabapentin   Facility contact: [employer]   Phone:  [phone]             Transportation contact phone#: [phone]  Please send day of procedure:current med list and meds last taken that day, confirm nothing by mouth status from what time, Patient Demographic info( to include DNR status, problem list, allergies)   RN contact name/phone#:  Shelda Altes, DON                           and Fax #: [phone]  Bring Insurance card and picture ID Leave all jewelry and other valuables at place where living( no metal or rings to be worn) No contact lens Women-no make-up, no lotions,perfumes,powders Men-no colognes,lotions  Any questions day of procedure,call  SHORT STAY-[phone]   Sent from :[facility] Presurgical Testing                   [place]                   Fax:[phone]  Sent by :  Dolores Lory  [place], Therapist, sports, Copywriter, advertising

## 2020-07-30 DIAGNOSIS — M25571 Pain in right ankle and joints of right foot: Secondary | ICD-10-CM | POA: Diagnosis not present

## 2020-07-30 DIAGNOSIS — M79675 Pain in left toe(s): Secondary | ICD-10-CM | POA: Diagnosis not present

## 2020-07-30 MED ORDER — DEXTROSE 5 % IV SOLN
3.0000 g | INTRAVENOUS | Status: AC
  Filled 2020-07-30: qty 3000

## 2020-07-31 DIAGNOSIS — J449 Chronic obstructive pulmonary disease, unspecified: Secondary | ICD-10-CM | POA: Diagnosis not present

## 2020-07-31 DIAGNOSIS — E782 Mixed hyperlipidemia: Secondary | ICD-10-CM | POA: Diagnosis not present

## 2020-07-31 DIAGNOSIS — I4891 Unspecified atrial fibrillation: Secondary | ICD-10-CM | POA: Diagnosis not present

## 2020-07-31 DIAGNOSIS — M6281 Muscle weakness (generalized): Secondary | ICD-10-CM | POA: Diagnosis not present

## 2020-07-31 DIAGNOSIS — I10 Essential (primary) hypertension: Secondary | ICD-10-CM | POA: Diagnosis not present

## 2020-07-31 DIAGNOSIS — G8929 Other chronic pain: Secondary | ICD-10-CM | POA: Diagnosis not present

## 2020-07-31 DIAGNOSIS — G629 Polyneuropathy, unspecified: Secondary | ICD-10-CM | POA: Diagnosis not present

## 2020-07-31 DIAGNOSIS — R52 Pain, unspecified: Secondary | ICD-10-CM | POA: Diagnosis not present

## 2020-07-31 DIAGNOSIS — R262 Difficulty in walking, not elsewhere classified: Secondary | ICD-10-CM | POA: Diagnosis not present

## 2020-07-31 NOTE — Progress Notes (Addendum)
Preop instructions for:    Kristopher Gay  Date of Birth   23-Jun-2051                         Date of Procedure:   08/07/20    Doctor: Dr. Percell Miller  [facility] to arrive at [facility]: 7:15 AM     Report to: Admitting   Procedure:Right Quadriceps Tendon Repair  You can have Clear Liquids from Midnight until 7:15 AM.After 7:15 AM, nothing until after surgery.  CLEAR LIQUID DIET   Foods Allowed                                                                  Coffee and tea, regular and decaf                             liquids that you cannot  Plain Jell-O any favor except red or purple                                           see through such as: Fruit ices (not with fruit pulp)                                    Popsicles                                        Carbonated beverages, regular and diet                                    Cranberry, grape and apple juices Sports drinks like Gatorade Lightly seasoned clear broth or consume(fat free) Sugar, honey syrup  Take these morning medications only with sips of water. Metoprolol, Gabapentin, Cilostazol, Tramadol,   Use Trelegy day of surgery  May use Flonase if needed  Facility contact: [employer]:  [phone]             Transportation contact phone#: [employer] [phone]  Please send day of procedure:current med list and meds last taken that day, confirm nothing by mouth status from what time, Patient Demographic info( to include DNR status, problem list, allergies)   RN contact name/phone#:  Shelda Altes, DON                           and Fax #: [phone]  Bring Insurance card and picture ID Leave all jewelry and other valuables at place where living( no metal or rings to be worn) No contact lens Women-no make-up, no lotions,perfumes,powders Men-no colognes,lotions  Any questions day of procedure,call  SHORT STAY-[phone]   Sent from :[facility] Presurgical Testing                    [place]  [phone]  Sent by : Harlon Flor, RN, BSN

## 2020-08-02 ENCOUNTER — Other Ambulatory Visit: Payer: Self-pay | Admitting: Internal Medicine

## 2020-08-03 DIAGNOSIS — G629 Polyneuropathy, unspecified: Secondary | ICD-10-CM | POA: Diagnosis not present

## 2020-08-03 DIAGNOSIS — E782 Mixed hyperlipidemia: Secondary | ICD-10-CM | POA: Diagnosis not present

## 2020-08-03 DIAGNOSIS — J449 Chronic obstructive pulmonary disease, unspecified: Secondary | ICD-10-CM | POA: Diagnosis not present

## 2020-08-03 DIAGNOSIS — I10 Essential (primary) hypertension: Secondary | ICD-10-CM | POA: Diagnosis not present

## 2020-08-03 DIAGNOSIS — R52 Pain, unspecified: Secondary | ICD-10-CM | POA: Diagnosis not present

## 2020-08-03 DIAGNOSIS — G8929 Other chronic pain: Secondary | ICD-10-CM | POA: Diagnosis not present

## 2020-08-03 DIAGNOSIS — M6281 Muscle weakness (generalized): Secondary | ICD-10-CM | POA: Diagnosis not present

## 2020-08-03 DIAGNOSIS — R262 Difficulty in walking, not elsewhere classified: Secondary | ICD-10-CM | POA: Diagnosis not present

## 2020-08-03 DIAGNOSIS — I4891 Unspecified atrial fibrillation: Secondary | ICD-10-CM | POA: Diagnosis not present

## 2020-08-07 ENCOUNTER — Inpatient Hospital Stay (HOSPITAL_COMMUNITY): Payer: Medicare PPO | Admitting: Anesthesiology

## 2020-08-07 ENCOUNTER — Encounter (HOSPITAL_COMMUNITY): Payer: Self-pay | Admitting: Orthopedic Surgery

## 2020-08-07 ENCOUNTER — Inpatient Hospital Stay (HOSPITAL_COMMUNITY)
Admission: RE | Admit: 2020-08-07 | Discharge: 2020-08-08 | DRG: 502 | Disposition: A | Discharge status: Skilled Nursing Facility | Payer: Medicare PPO | Attending: Orthopedic Surgery | Admitting: Orthopedic Surgery

## 2020-08-07 ENCOUNTER — Other Ambulatory Visit: Payer: Self-pay

## 2020-08-07 ENCOUNTER — Encounter (HOSPITAL_COMMUNITY)
Admission: RE | Disposition: A | Discharge status: Skilled Nursing Facility | Payer: Self-pay | Source: Home / Self Care | Attending: Orthopedic Surgery

## 2020-08-07 DIAGNOSIS — Z888 Allergy status to other drugs, medicaments and biological substances status: Secondary | ICD-10-CM | POA: Diagnosis not present

## 2020-08-07 DIAGNOSIS — F419 Anxiety disorder, unspecified: Secondary | ICD-10-CM | POA: Diagnosis present

## 2020-08-07 DIAGNOSIS — S83411A Sprain of medial collateral ligament of right knee, initial encounter: Secondary | ICD-10-CM | POA: Diagnosis not present

## 2020-08-07 DIAGNOSIS — R6889 Other general symptoms and signs: Secondary | ICD-10-CM | POA: Diagnosis not present

## 2020-08-07 DIAGNOSIS — X58XXXA Exposure to other specified factors, initial encounter: Secondary | ICD-10-CM | POA: Diagnosis present

## 2020-08-07 DIAGNOSIS — Z91030 Bee allergy status: Secondary | ICD-10-CM | POA: Diagnosis not present

## 2020-08-07 DIAGNOSIS — Z6832 Body mass index (BMI) 32.0-32.9, adult: Secondary | ICD-10-CM

## 2020-08-07 DIAGNOSIS — I4891 Unspecified atrial fibrillation: Secondary | ICD-10-CM | POA: Diagnosis present

## 2020-08-07 DIAGNOSIS — Z23 Encounter for immunization: Secondary | ICD-10-CM | POA: Diagnosis present

## 2020-08-07 DIAGNOSIS — S76111A Strain of right quadriceps muscle, fascia and tendon, initial encounter: Secondary | ICD-10-CM | POA: Diagnosis present

## 2020-08-07 DIAGNOSIS — J439 Emphysema, unspecified: Secondary | ICD-10-CM | POA: Diagnosis present

## 2020-08-07 DIAGNOSIS — Z20822 Contact with and (suspected) exposure to covid-19: Secondary | ICD-10-CM | POA: Diagnosis present

## 2020-08-07 DIAGNOSIS — S83421A Sprain of lateral collateral ligament of right knee, initial encounter: Secondary | ICD-10-CM | POA: Diagnosis not present

## 2020-08-07 DIAGNOSIS — M6281 Muscle weakness (generalized): Secondary | ICD-10-CM | POA: Diagnosis not present

## 2020-08-07 DIAGNOSIS — I10 Essential (primary) hypertension: Secondary | ICD-10-CM | POA: Diagnosis present

## 2020-08-07 DIAGNOSIS — M2550 Pain in unspecified joint: Secondary | ICD-10-CM | POA: Diagnosis not present

## 2020-08-07 DIAGNOSIS — G8929 Other chronic pain: Secondary | ICD-10-CM | POA: Diagnosis present

## 2020-08-07 DIAGNOSIS — Z79899 Other long term (current) drug therapy: Secondary | ICD-10-CM | POA: Diagnosis not present

## 2020-08-07 DIAGNOSIS — Z743 Need for continuous supervision: Secondary | ICD-10-CM | POA: Diagnosis not present

## 2020-08-07 DIAGNOSIS — R262 Difficulty in walking, not elsewhere classified: Secondary | ICD-10-CM | POA: Diagnosis not present

## 2020-08-07 DIAGNOSIS — F329 Major depressive disorder, single episode, unspecified: Secondary | ICD-10-CM | POA: Diagnosis present

## 2020-08-07 DIAGNOSIS — M66261 Spontaneous rupture of extensor tendons, right lower leg: Secondary | ICD-10-CM | POA: Diagnosis not present

## 2020-08-07 DIAGNOSIS — R41841 Cognitive communication deficit: Secondary | ICD-10-CM | POA: Diagnosis not present

## 2020-08-07 DIAGNOSIS — M25561 Pain in right knee: Secondary | ICD-10-CM | POA: Diagnosis not present

## 2020-08-07 DIAGNOSIS — S76101S Unspecified injury of right quadriceps muscle, fascia and tendon, sequela: Secondary | ICD-10-CM | POA: Diagnosis not present

## 2020-08-07 DIAGNOSIS — M66251 Spontaneous rupture of extensor tendons, right thigh: Secondary | ICD-10-CM | POA: Diagnosis not present

## 2020-08-07 DIAGNOSIS — R5381 Other malaise: Secondary | ICD-10-CM | POA: Diagnosis not present

## 2020-08-07 DIAGNOSIS — G8918 Other acute postprocedural pain: Secondary | ICD-10-CM | POA: Diagnosis not present

## 2020-08-07 DIAGNOSIS — G629 Polyneuropathy, unspecified: Secondary | ICD-10-CM | POA: Diagnosis present

## 2020-08-07 DIAGNOSIS — I739 Peripheral vascular disease, unspecified: Secondary | ICD-10-CM | POA: Diagnosis present

## 2020-08-07 DIAGNOSIS — M7041 Prepatellar bursitis, right knee: Secondary | ICD-10-CM | POA: Diagnosis not present

## 2020-08-07 DIAGNOSIS — Z7982 Long term (current) use of aspirin: Secondary | ICD-10-CM

## 2020-08-07 DIAGNOSIS — E669 Obesity, unspecified: Secondary | ICD-10-CM | POA: Diagnosis present

## 2020-08-07 DIAGNOSIS — Z7401 Bed confinement status: Secondary | ICD-10-CM | POA: Diagnosis not present

## 2020-08-07 DIAGNOSIS — K219 Gastro-esophageal reflux disease without esophagitis: Secondary | ICD-10-CM | POA: Diagnosis present

## 2020-08-07 DIAGNOSIS — I480 Paroxysmal atrial fibrillation: Secondary | ICD-10-CM | POA: Diagnosis not present

## 2020-08-07 DIAGNOSIS — M1990 Unspecified osteoarthritis, unspecified site: Secondary | ICD-10-CM | POA: Diagnosis present

## 2020-08-07 HISTORY — PX: QUADRICEPS TENDON REPAIR: SHX756

## 2020-08-07 LAB — RESPIRATORY PANEL BY RT PCR (FLU A&B, COVID)
Influenza A by PCR: NEGATIVE
Influenza B by PCR: NEGATIVE
SARS Coronavirus 2 by RT PCR: NEGATIVE

## 2020-08-07 LAB — BASIC METABOLIC PANEL
Anion gap: 14 (ref 5–15)
BUN: 16 mg/dL (ref 8–23)
CO2: 24 mmol/L (ref 22–32)
Calcium: 9.6 mg/dL (ref 8.9–10.3)
Chloride: 102 mmol/L (ref 98–111)
Creatinine, Ser: 0.94 mg/dL (ref 0.61–1.24)
GFR, Estimated: >60 mL/min (ref >60)
Glucose, Bld: 104 mg/dL — ABNORMAL HIGH (ref 70–99)
Potassium: 3.9 mmol/L (ref 3.5–5.1)
Sodium: 140 mmol/L (ref 135–145)

## 2020-08-07 LAB — CBC
HCT: 36.4 % — ABNORMAL LOW (ref 39.0–52.0)
Hemoglobin: 12.4 g/dL — ABNORMAL LOW (ref 13.0–17.0)
MCH: 31.7 pg (ref 26.0–34.0)
MCHC: 34.1 g/dL (ref 30.0–36.0)
MCV: 93.1 fL (ref 80.0–100.0)
Platelets: 176 10*3/uL (ref 150–400)
RBC: 3.91 MIL/uL — ABNORMAL LOW (ref 4.22–5.81)
RDW: 13.4 % (ref 11.5–15.5)
WBC: 8.2 10*3/uL (ref 4.0–10.5)
nRBC: 0 % (ref 0.0–0.2)

## 2020-08-07 LAB — MRSA PCR SCREENING: MRSA by PCR: NEGATIVE

## 2020-08-07 SURGERY — REPAIR, TENDON, QUADRICEPS
Anesthesia: General | Laterality: Right

## 2020-08-07 MED ORDER — CLONIDINE HCL (ANALGESIA) 100 MCG/ML EP SOLN
EPIDURAL | Status: DC | PRN
  Administered 2020-08-07: 50 ug

## 2020-08-07 MED ORDER — BUPIVACAINE HCL (PF) 0.25 % IJ SOLN
INTRAMUSCULAR | Status: AC
  Filled 2020-08-07: qty 30

## 2020-08-07 MED ORDER — ACETAMINOPHEN 500 MG PO TABS
1000.0000 mg | ORAL_TABLET | Freq: Once | ORAL | Status: AC
  Administered 2020-08-07: 1000 mg via ORAL
  Filled 2020-08-07: qty 2

## 2020-08-07 MED ORDER — BUPIVACAINE HCL (PF) 0.5 % IJ SOLN
INTRAMUSCULAR | Status: AC
  Filled 2020-08-07: qty 30

## 2020-08-07 MED ORDER — ONDANSETRON HCL 4 MG PO TABS: 4.0000 mg | ORAL_TABLET | Freq: Four times a day (QID) | ORAL | Status: DC | PRN

## 2020-08-07 MED ORDER — FLUTICASONE-UMECLIDIN-VILANT 100-62.5-25 MCG/INH IN AEPB: 1.0000 | INHALATION_SPRAY | Freq: Every day | RESPIRATORY_TRACT | Status: DC

## 2020-08-07 MED ORDER — ROCURONIUM BROMIDE 10 MG/ML (PF) SYRINGE
PREFILLED_SYRINGE | INTRAVENOUS | Status: DC | PRN
  Administered 2020-08-07: 60 mg via INTRAVENOUS

## 2020-08-07 MED ORDER — OXYCODONE HCL 5 MG PO TABS: 5.0000 mg | ORAL_TABLET | ORAL | 0 refills | Status: DC | PRN

## 2020-08-07 MED ORDER — SUGAMMADEX SODIUM 200 MG/2ML IV SOLN
INTRAVENOUS | Status: DC | PRN
  Administered 2020-08-07: 250 mg via INTRAVENOUS

## 2020-08-07 MED ORDER — METHOCARBAMOL 500 MG PO TABS
500.0000 mg | ORAL_TABLET | Freq: Four times a day (QID) | ORAL | Status: DC | PRN
  Administered 2020-08-07 – 2020-08-08 (×3): 500 mg via ORAL
  Filled 2020-08-07 (×3): qty 1

## 2020-08-07 MED ORDER — UMECLIDINIUM BROMIDE 62.5 MCG/INH IN AEPB
1.0000 | INHALATION_SPRAY | Freq: Every day | RESPIRATORY_TRACT | Status: DC
  Administered 2020-08-08: 1 via RESPIRATORY_TRACT
  Filled 2020-08-07: qty 7

## 2020-08-07 MED ORDER — ORAL CARE MOUTH RINSE: 15.0000 mL | Freq: Once | OROMUCOSAL | Status: AC

## 2020-08-07 MED ORDER — ASPIRIN EC 325 MG PO TBEC: 325.0000 mg | DELAYED_RELEASE_TABLET | Freq: Every day | ORAL | 0 refills | Status: DC

## 2020-08-07 MED ORDER — LIDOCAINE 2% (20 MG/ML) 5 ML SYRINGE
INTRAMUSCULAR | Status: DC | PRN
  Administered 2020-08-07: 50 mg via INTRAVENOUS

## 2020-08-07 MED ORDER — CEFAZOLIN SODIUM-DEXTROSE 2-3 GM-%(50ML) IV SOLR
INTRAVENOUS | Status: DC | PRN
  Administered 2020-08-07: 2 g via INTRAVENOUS

## 2020-08-07 MED ORDER — EPHEDRINE SULFATE-NACL 50-0.9 MG/10ML-% IV SOSY
PREFILLED_SYRINGE | INTRAVENOUS | Status: DC | PRN
  Administered 2020-08-07: 10 mg via INTRAVENOUS

## 2020-08-07 MED ORDER — LIDOCAINE 2% (20 MG/ML) 5 ML SYRINGE
INTRAMUSCULAR | Status: AC
  Filled 2020-08-07: qty 5

## 2020-08-07 MED ORDER — ONDANSETRON HCL 4 MG/2ML IJ SOLN
INTRAMUSCULAR | Status: AC
  Filled 2020-08-07: qty 2

## 2020-08-07 MED ORDER — POLYETHYLENE GLYCOL 3350 17 G PO PACK: 17.0000 g | PACK | Freq: Every day | ORAL | Status: DC | PRN

## 2020-08-07 MED ORDER — METOCLOPRAMIDE HCL 5 MG/ML IJ SOLN: 5.0000 mg | Freq: Three times a day (TID) | INTRAMUSCULAR | Status: DC | PRN

## 2020-08-07 MED ORDER — FENTANYL CITRATE (PF) 100 MCG/2ML IJ SOLN
INTRAMUSCULAR | Status: DC | PRN
Start: 2020-08-07 — End: 2020-08-07
  Administered 2020-08-07 (×2): 50 ug via INTRAVENOUS

## 2020-08-07 MED ORDER — EPHEDRINE 5 MG/ML INJ
INTRAVENOUS | Status: AC
  Filled 2020-08-07: qty 10

## 2020-08-07 MED ORDER — FENTANYL CITRATE (PF) 100 MCG/2ML IJ SOLN: 25.0000 ug | INTRAMUSCULAR | Status: DC | PRN

## 2020-08-07 MED ORDER — ONDANSETRON HCL 4 MG/2ML IJ SOLN: 4.0000 mg | Freq: Four times a day (QID) | INTRAMUSCULAR | Status: DC | PRN

## 2020-08-07 MED ORDER — ACETAMINOPHEN 500 MG PO TABS
1000.0000 mg | ORAL_TABLET | Freq: Four times a day (QID) | ORAL | Status: AC
  Administered 2020-08-07 – 2020-08-08 (×4): 1000 mg via ORAL
  Filled 2020-08-07 (×4): qty 2

## 2020-08-07 MED ORDER — FENTANYL CITRATE (PF) 100 MCG/2ML IJ SOLN
INTRAMUSCULAR | Status: AC
Start: 2020-08-07 — End: ?
  Filled 2020-08-07: qty 2

## 2020-08-07 MED ORDER — DEXAMETHASONE SODIUM PHOSPHATE 10 MG/ML IJ SOLN
INTRAMUSCULAR | Status: DC | PRN
  Administered 2020-08-07: 8 mg via INTRAVENOUS

## 2020-08-07 MED ORDER — FLUTICASONE PROPIONATE 50 MCG/ACT NA SUSP: 1.0000 | Freq: Every day | NASAL | Status: DC

## 2020-08-07 MED ORDER — FENTANYL CITRATE (PF) 100 MCG/2ML IJ SOLN
50.0000 ug | INTRAMUSCULAR | Status: DC
  Administered 2020-08-07: 50 ug via INTRAVENOUS
  Filled 2020-08-07: qty 2

## 2020-08-07 MED ORDER — LACTATED RINGERS IV SOLN: INTRAVENOUS | Status: DC

## 2020-08-07 MED ORDER — CEFAZOLIN SODIUM-DEXTROSE 2-4 GM/100ML-% IV SOLN
2.0000 g | Freq: Four times a day (QID) | INTRAVENOUS | Status: AC
  Administered 2020-08-07 – 2020-08-08 (×3): 2 g via INTRAVENOUS
  Filled 2020-08-07 (×3): qty 100

## 2020-08-07 MED ORDER — TRANEXAMIC ACID-NACL 1000-0.7 MG/100ML-% IV SOLN
1000.0000 mg | INTRAVENOUS | Status: AC
  Administered 2020-08-07: 1000 mg via INTRAVENOUS
  Filled 2020-08-07: qty 100

## 2020-08-07 MED ORDER — 0.9 % SODIUM CHLORIDE (POUR BTL) OPTIME
TOPICAL | Status: DC | PRN
  Administered 2020-08-07: 1000 mL

## 2020-08-07 MED ORDER — POLYETHYLENE GLYCOL 3350 17 G PO PACK
17.0000 g | PACK | Freq: Two times a day (BID) | ORAL | Status: DC
  Administered 2020-08-07 – 2020-08-08 (×3): 17 g via ORAL
  Filled 2020-08-07 (×3): qty 1

## 2020-08-07 MED ORDER — INFLUENZA VAC A&B SA ADJ QUAD 0.5 ML IM PRSY
0.5000 mL | PREFILLED_SYRINGE | INTRAMUSCULAR | Status: AC
  Administered 2020-08-08: 0.5 mL via INTRAMUSCULAR
  Filled 2020-08-07: qty 0.5

## 2020-08-07 MED ORDER — TRAMADOL HCL 50 MG PO TABS
50.0000 mg | ORAL_TABLET | Freq: Two times a day (BID) | ORAL | Status: DC
  Filled 2020-08-07 (×2): qty 1

## 2020-08-07 MED ORDER — CEFAZOLIN SODIUM-DEXTROSE 2-4 GM/100ML-% IV SOLN
INTRAVENOUS | Status: AC
  Filled 2020-08-07: qty 100

## 2020-08-07 MED ORDER — ONDANSETRON HCL 4 MG/2ML IJ SOLN
INTRAMUSCULAR | Status: DC | PRN
  Administered 2020-08-07: 4 mg via INTRAVENOUS

## 2020-08-07 MED ORDER — DOCUSATE SODIUM 100 MG PO CAPS
100.0000 mg | ORAL_CAPSULE | Freq: Two times a day (BID) | ORAL | Status: DC
  Administered 2020-08-07 – 2020-08-08 (×3): 100 mg via ORAL
  Filled 2020-08-07 (×3): qty 1

## 2020-08-07 MED ORDER — METHOCARBAMOL 500 MG IVPB - SIMPLE MED
INTRAVENOUS | Status: AC
  Filled 2020-08-07: qty 50

## 2020-08-07 MED ORDER — ASPIRIN 325 MG PO TABS
325.0000 mg | ORAL_TABLET | Freq: Two times a day (BID) | ORAL | Status: DC
  Administered 2020-08-07 – 2020-08-08 (×3): 325 mg via ORAL
  Filled 2020-08-07 (×3): qty 1

## 2020-08-07 MED ORDER — DEXAMETHASONE SODIUM PHOSPHATE 10 MG/ML IJ SOLN
INTRAMUSCULAR | Status: AC
  Filled 2020-08-07: qty 1

## 2020-08-07 MED ORDER — SUGAMMADEX SODIUM 500 MG/5ML IV SOLN
INTRAVENOUS | Status: AC
  Filled 2020-08-07: qty 5

## 2020-08-07 MED ORDER — ATORVASTATIN CALCIUM 40 MG PO TABS: 40.0000 mg | ORAL_TABLET | Freq: Every day | ORAL | Status: DC

## 2020-08-07 MED ORDER — ROCURONIUM BROMIDE 10 MG/ML (PF) SYRINGE
PREFILLED_SYRINGE | INTRAVENOUS | Status: AC
  Filled 2020-08-07: qty 10

## 2020-08-07 MED ORDER — FLUTICASONE FUROATE-VILANTEROL 100-25 MCG/INH IN AEPB
1.0000 | INHALATION_SPRAY | Freq: Every day | RESPIRATORY_TRACT | Status: DC
  Administered 2020-08-08: 1 via RESPIRATORY_TRACT
  Filled 2020-08-07: qty 28

## 2020-08-07 MED ORDER — GABAPENTIN 300 MG PO CAPS
300.0000 mg | ORAL_CAPSULE | Freq: Every day | ORAL | Status: DC
  Administered 2020-08-08: 300 mg via ORAL
  Filled 2020-08-07: qty 1

## 2020-08-07 MED ORDER — EPINEPHRINE 0.3 MG/0.3ML IJ SOAJ: 0.3000 mg | Freq: Once | INTRAMUSCULAR | Status: DC

## 2020-08-07 MED ORDER — METOPROLOL SUCCINATE ER 25 MG PO TB24
25.0000 mg | ORAL_TABLET | Freq: Every day | ORAL | Status: DC
  Administered 2020-08-08: 25 mg via ORAL
  Filled 2020-08-07: qty 1

## 2020-08-07 MED ORDER — BUPIVACAINE-EPINEPHRINE (PF) 0.5% -1:200000 IJ SOLN
INTRAMUSCULAR | Status: DC | PRN
  Administered 2020-08-07: 30 mL via PERINEURAL

## 2020-08-07 MED ORDER — METHOCARBAMOL 500 MG IVPB - SIMPLE MED
500.0000 mg | Freq: Four times a day (QID) | INTRAVENOUS | Status: DC | PRN
  Administered 2020-08-07: 500 mg via INTRAVENOUS
  Filled 2020-08-07: qty 50

## 2020-08-07 MED ORDER — CHLORHEXIDINE GLUCONATE 0.12 % MT SOLN
15.0000 mL | Freq: Once | OROMUCOSAL | Status: AC
  Administered 2020-08-07: 15 mL via OROMUCOSAL

## 2020-08-07 MED ORDER — ACETAMINOPHEN 325 MG PO TABS: 325.0000 mg | ORAL_TABLET | Freq: Four times a day (QID) | ORAL | Status: DC | PRN

## 2020-08-07 MED ORDER — METOCLOPRAMIDE HCL 5 MG PO TABS: 5.0000 mg | ORAL_TABLET | Freq: Three times a day (TID) | ORAL | Status: DC | PRN

## 2020-08-07 MED ORDER — ONDANSETRON HCL 4 MG/2ML IJ SOLN: 4.0000 mg | Freq: Once | INTRAMUSCULAR | Status: DC | PRN

## 2020-08-07 MED ORDER — NITROGLYCERIN 0.4 MG SL SUBL: 0.4000 mg | SUBLINGUAL_TABLET | SUBLINGUAL | Status: DC | PRN

## 2020-08-07 MED ORDER — PROPOFOL 10 MG/ML IV BOLUS
INTRAVENOUS | Status: DC | PRN
  Administered 2020-08-07: 180 mg via INTRAVENOUS

## 2020-08-07 MED ORDER — MIDAZOLAM HCL 2 MG/2ML IJ SOLN
1.0000 mg | INTRAMUSCULAR | Status: DC
  Administered 2020-08-07: 1 mg via INTRAVENOUS
  Filled 2020-08-07: qty 2

## 2020-08-07 SURGICAL SUPPLY — 48 items
APL PRP STRL LF DISP 70% ISPRP (MISCELLANEOUS) ×1
BANDAGE ESMARK 6X9 LF (GAUZE/BANDAGES/DRESSINGS) ×1 IMPLANT
BIT DRILL 2.0X128 (BIT) ×2 IMPLANT
BIT DRILL 2.0X128MM (BIT) ×1
BLADE SURG SZ10 CARB STEEL (BLADE) ×3 IMPLANT
BNDG CMPR 9X6 STRL LF SNTH (GAUZE/BANDAGES/DRESSINGS) ×1
BNDG ELASTIC 6X5.8 VLCR STR LF (GAUZE/BANDAGES/DRESSINGS) ×3 IMPLANT
BNDG ESMARK 6X9 LF (GAUZE/BANDAGES/DRESSINGS) ×3
CHLORAPREP W/TINT 26 (MISCELLANEOUS) ×3 IMPLANT
CLOSURE STERI-STRIP 1/2X4 (GAUZE/BANDAGES/DRESSINGS) ×1
CLSR STERI-STRIP ANTIMIC 1/2X4 (GAUZE/BANDAGES/DRESSINGS) ×2 IMPLANT
COVER WAND RF STERILE (DRAPES) IMPLANT
CUFF TOURN SGL QUICK 34 (TOURNIQUET CUFF) ×3
CUFF TRNQT CYL 34X4.125X (TOURNIQUET CUFF) ×1 IMPLANT
DECANTER SPIKE VIAL GLASS SM (MISCELLANEOUS) IMPLANT
DRAPE BILATERAL LIMB T (DRAPES) IMPLANT
DRAPE U-SHAPE 47X51 STRL (DRAPES) ×3 IMPLANT
DRSG ADAPTIC 3X8 NADH LF (GAUZE/BANDAGES/DRESSINGS) ×3 IMPLANT
DRSG MEPILEX BORDER 4X8 (GAUZE/BANDAGES/DRESSINGS) ×2 IMPLANT
DRSG PAD ABDOMINAL 8X10 ST (GAUZE/BANDAGES/DRESSINGS) ×3 IMPLANT
ELECT REM PT RETURN 15FT ADLT (MISCELLANEOUS) ×3 IMPLANT
GAUZE SPONGE 4X4 12PLY STRL (GAUZE/BANDAGES/DRESSINGS) ×3 IMPLANT
GLOVE BIO SURGEON STRL SZ7.5 (GLOVE) ×6 IMPLANT
GLOVE BIOGEL PI IND STRL 8 (GLOVE) ×1 IMPLANT
GLOVE BIOGEL PI INDICATOR 8 (GLOVE) ×2
GLOVE INDICATOR 7.5 STRL GRN (GLOVE) ×3 IMPLANT
GOWN STRL REUS W/ TWL LRG LVL3 (GOWN DISPOSABLE) ×2 IMPLANT
GOWN STRL REUS W/TWL LRG LVL3 (GOWN DISPOSABLE) ×6
IMMOBILIZER KNEE 22 UNIV (SOFTGOODS) ×2 IMPLANT
NDL MA TROC 1/2 (NEEDLE) IMPLANT
NDL SUT 6 .5 CRC .975X.05 MAYO (NEEDLE) IMPLANT
NEEDLE MA TROC 1/2 (NEEDLE) ×3 IMPLANT
NEEDLE MAYO TAPER (NEEDLE) ×3
NS IRRIG 1000ML POUR BTL (IV SOLUTION) ×3 IMPLANT
PACK ORTHO EXTREMITY (CUSTOM PROCEDURE TRAY) ×3 IMPLANT
PADDING CAST COTTON 6X4 STRL (CAST SUPPLIES) ×3 IMPLANT
RETRIEVER SUT HEWSON (MISCELLANEOUS) IMPLANT
SLEEVE SCD COMPRESS KNEE MED (MISCELLANEOUS) IMPLANT
STOCKINETTE 8 INCH (MISCELLANEOUS) ×3 IMPLANT
SUT FIBERWIRE #2 38 T-5 BLUE (SUTURE)
SUT MNCRL AB 4-0 PS2 18 (SUTURE) ×3 IMPLANT
SUT VIC AB 0 CT1 27 (SUTURE) ×9
SUT VIC AB 0 CT1 27XBRD ANBCTR (SUTURE) ×3 IMPLANT
SUT VIC AB 2-0 SH 27 (SUTURE) ×6
SUT VIC AB 2-0 SH 27XBRD (SUTURE) ×2 IMPLANT
SUTURE FIBERWR #2 38 T-5 BLUE (SUTURE) IMPLANT
TOWEL OR 17X26 10 PK STRL BLUE (TOWEL DISPOSABLE) ×3 IMPLANT
UNDERPAD 30X36 HEAVY ABSORB (UNDERPADS AND DIAPERS) ×3 IMPLANT

## 2020-08-07 NOTE — Interval H&P Note (Signed)
History and Physical Interval Note:  08/07/2020 6:56 AM  Kristopher Gay  has presented today for surgery, with the diagnosis of RIGHT QUAD TENDON RUPTURE,.  The various methods of treatment have been discussed with the patient and family. After consideration of risks, benefits and other options for treatment, the patient has consented to  Procedure(s) with comments: REPAIR QUADRICEP TENDON (Right) - NEED RAPID TEST;SAME DAY WORKUP; [place] as a surgical intervention.  The patient's history has been reviewed, patient examined, no change in status, stable for surgery.  I have reviewed the patient's chart and labs.  Questions were answered to the patient's satisfaction.     Renette Butters

## 2020-08-07 NOTE — Progress Notes (Signed)
Assisted Dr. Gifford Shave with right, ultrasound guided, femoral block. Side rails up, monitors on throughout procedure. See vital signs in flow sheet. Tolerated Procedure well.

## 2020-08-07 NOTE — Anesthesia Procedure Notes (Signed)
Anesthesia Regional Block: Femoral nerve block   Pre-Anesthetic Checklist: ,, timeout performed, Correct Patient, Correct Site, Correct Laterality, Correct Procedure, Correct Position, site marked, Risks and benefits discussed,  Surgical consent,  Pre-op evaluation,  At surgeon's request and post-op pain management  Laterality: Right  Prep: chloraprep       Needles:  Injection technique: Single-shot  Needle Type: Echogenic Needle     Needle Length: 9cm  Needle Gauge: 21     Additional Needles:   Procedures:,,,, ultrasound used (permanent image in chart),,,,  Narrative:  Start time: 08/07/2020 8:41 AM End time: 08/07/2020 8:46 AM Injection made incrementally with aspirations every 5 mL.  Performed by: Personally  Anesthesiologist: Catalina Gravel, MD  Additional Notes: No pain on injection. No increased resistance to injection. Injection made in 5cc increments.  Good needle visualization.  Patient tolerated procedure well.

## 2020-08-07 NOTE — Anesthesia Postprocedure Evaluation (Signed)
Anesthesia Post Note  Patient: Kristopher Gay  Procedure(s) Performed: REPAIR QUADRICEP TENDON (Right )     Patient location during evaluation: PACU Anesthesia Type: General Level of consciousness: awake and alert Pain management: pain level controlled Vital Signs Assessment: post-procedure vital signs reviewed and stable Respiratory status: spontaneous breathing, nonlabored ventilation and respiratory function stable Cardiovascular status: blood pressure returned to baseline and stable Postop Assessment: no apparent nausea or vomiting Anesthetic complications: no   No complications documented.  Last Vitals:  Vitals:   08/07/20 1230 08/07/20 1300  BP: 106/63 109/68  Pulse: 64 65  Resp: 15 20  Temp: 36.6 C   SpO2: 94% 93%    Last Pain:  Vitals:   08/07/20 1300  TempSrc:   PainSc: 0-No pain                 Catalina Gravel

## 2020-08-07 NOTE — Anesthesia Preprocedure Evaluation (Addendum)
Anesthesia Evaluation  Patient identified by MRN, date of birth, ID band Patient awake    Reviewed: Allergy & Precautions, NPO status , Patient's Chart, lab work & pertinent test results, reviewed documented beta blocker date and time   History of Anesthesia Complications (+) PONV and history of anesthetic complications  Airway Mallampati: II  TM Distance: >3 FB Neck ROM: Full    Dental  (+) Teeth Intact   Pulmonary asthma , COPD,  COPD inhaler,    Pulmonary exam normal breath sounds clear to auscultation       Cardiovascular hypertension, Pt. on home beta blockers + Peripheral Vascular Disease  Normal cardiovascular exam+ dysrhythmias Atrial Fibrillation  Rhythm:Regular Rate:Normal     Neuro/Psych  Headaches, PSYCHIATRIC DISORDERS Anxiety Depression  Neuromuscular disease    GI/Hepatic Neg liver ROS, GERD  ,  Endo/Other  negative endocrine ROS  Renal/GU negative Renal ROS     Musculoskeletal  (+) Arthritis , RIGHT QUAD TENDON RUPTURE   Abdominal   Peds  Hematology negative hematology ROS (+)   Anesthesia Other Findings Day of surgery medications reviewed with the patient.  Reproductive/Obstetrics                           Anesthesia Physical Anesthesia Plan  ASA: III  Anesthesia Plan: General   Post-op Pain Management:  Regional for Post-op pain   Induction: Intravenous  PONV Risk Score and Plan: 3 and Midazolam, Dexamethasone and Ondansetron  Airway Management Planned: Oral ETT  Additional Equipment:   Intra-op Plan:   Post-operative Plan: Extubation in OR  Informed Consent: I have reviewed the patients History and Physical, chart, labs and discussed the procedure including the risks, benefits and alternatives for the proposed anesthesia with the patient or authorized representative who has indicated his/her understanding and acceptance.       Plan Discussed with:  CRNA  Anesthesia Plan Comments:         Anesthesia Quick Evaluation

## 2020-08-07 NOTE — Transfer of Care (Signed)
Immediate Anesthesia Transfer of Care Note  Patient: MAXFIELD GILDERSLEEVE  Procedure(s) Performed: REPAIR QUADRICEP TENDON (Right )  Patient Location: PACU  Anesthesia Type:GA combined with regional for post-op pain  Level of Consciousness: awake, alert , oriented, patient cooperative and responds to stimulation  Airway & Oxygen Therapy: Patient Spontanous Breathing and Patient connected to face mask oxygen  Post-op Assessment: Report given to RN and Post -op Vital signs reviewed and stable  Post vital signs: Reviewed and stable  Last Vitals:  Vitals Value Taken Time  BP 152/85 08/07/20 1102  Temp    Pulse 87 08/07/20 1105  Resp 18 08/07/20 1105  SpO2 96 % 08/07/20 1105  Vitals shown include unvalidated device data.  Last Pain:  Vitals:   08/07/20 0736  TempSrc: Oral         Complications: No complications documented.

## 2020-08-07 NOTE — Op Note (Signed)
08/07/2020  10:33 AM  PATIENT:  Kristopher Gay    PRE-OPERATIVE DIAGNOSIS:  RIGHT QUAD TENDON RUPTURE,  POST-OPERATIVE DIAGNOSIS:  Same  PROCEDURE:  REPAIR QUADRICEP TENDON  SURGEON:  Renette Butters, MD  ASSISTANT: Margy Clarks, PA-C, he was present and scrubbed throughout the case, critical for completion in a timely fashion, and for retraction, instrumentation, and closure.   ANESTHESIA:   gen  PREOPERATIVE INDICATIONS:  Kristopher Gay is a  69 y.o. male with a diagnosis of RIGHT QUAD TENDON RUPTURE, who elected for surgical management.    The risks benefits and alternatives were discussed with the patient preoperatively including but not limited to the risks of infection, bleeding, nerve injury, cardiopulmonary complications, the need for revision surgery, among others, and the patient was willing to proceed.  OPERATIVE FINDINGS: complete rupture  BLOOD LOSS: min  TOURNIQUET TIME: 7min  OPERATIVE PROCEDURE:  Patient was identified in the preoperative holding area and site was marked by me He was transported to the operating theater and placed on the table in supine position taking care to pad all bony prominences. After a preincinduction time out anesthesia was induced. The right lower extremity was prepped and draped in normal sterile fashion and a pre-incision timeout was performed. He received ancef for preoperative antibiotics.   I made an incision directly over his traumatic injury. I dissected down to the level of the peritenon and elevated skin flaps over top of this there were full-thickness.  I then incised the peritenon it was partially ruptured as well. Identified his rupture tendon.  I debrided tendon from the patella allowing a good bony bed for healing.  I then used 2 #2 FiberWire as a whipstitched up and back in the Tendon leaving me with 4 total strands coming out.  I thoroughly irrigated the joint  Next I used a drill bit to drill 3 holes in the patella  taking care to not penetrate the articular surface. I used a [place] suture passer to pass all 4 stitches through these holes.  The patella reapproximated well to the tendon. I tied the stitches over top of the bone bridge of the patella.  I then stressed the repair and it was stable to 45 degrees.  I then thoroughly irrigated the wound again.  I repaired the medial collateral capsul  I then repaired the lateral collateral capsul  I closed the ruptured peritenon as well as the surgically incised peritenon with an 0 Vicryl. Then closed the skin with a monocryl stitch  Sterile dressing was applied the knee was placed in a knee immobilizer and taken the PACU in stable condition.  POST OPERATIVE PLAN: WBAT in immobilizer, DVT px: early ambulation and chemical px

## 2020-08-07 NOTE — Anesthesia Procedure Notes (Signed)
Procedure Name: Intubation Date/Time: 08/07/2020 9:47 AM Performed by: Niel Hummer, CRNA Pre-anesthesia Checklist: Patient identified, Emergency Drugs available, Suction available and Patient being monitored Patient Re-evaluated:Patient Re-evaluated prior to induction Oxygen Delivery Method: Circle system utilized Preoxygenation: Pre-oxygenation with 100% oxygen Induction Type: IV induction Ventilation: Mask ventilation without difficulty and Oral airway inserted - appropriate to patient size Laryngoscope Size: Mac Grade View: Grade I Tube type: Oral Tube size: 7.5 mm Number of attempts: 1 Airway Equipment and Method: Stylet Placement Confirmation: ETT inserted through vocal cords under direct vision,  positive ETCO2 and breath sounds checked- equal and bilateral Secured at: 24 cm Tube secured with: Tape Dental Injury: Teeth and Oropharynx as per pre-operative assessment  Comments: Intubation done by EMT student Rodman Key under CRNA and MDA guidance. ETT passed easily.

## 2020-08-07 NOTE — Evaluation (Signed)
Physical Therapy Evaluation Patient Details Name: Kristopher Gay MRN: [id number] DOB: April 08, 1951 Today's Date: 08/07/2020   History of Present Illness  R quad tendon repair 08/07/20  Clinical Impression  Pt admitted with above diagnosis. Min A for sit to stand then to pivot to recliner. Pt has been non-ambulatory for the past month at a SNF where he was performing WC transfers only. Pt stated he plans to DC to SNF. Pt currently with functional limitations due to the deficits listed below (see PT Problem List). Pt will benefit from skilled PT to increase their independence and safety with mobility to allow discharge to the venue listed below.       Follow Up Recommendations SNF    Equipment Recommendations  None recommended by PT    Recommendations for Other Services       Precautions / Restrictions Precautions Precautions: Fall Precaution Comments: h/o multiple falls at baseline Required Braces or Orthoses: Knee Immobilizer - Right Knee Immobilizer - Right: On at all times Restrictions Weight Bearing Restrictions: No RLE Weight Bearing: Weight bearing as tolerated      Mobility  Bed Mobility Overal bed mobility: Modified Independent             General bed mobility comments: HOB up, used bedrail, increased time  Transfers Overall transfer level: Needs assistance Equipment used: Rolling walker (2 wheeled) Transfers: Sit to/from [facility] Sit to Stand: From elevated surface;Min assist Stand pivot transfers: Min assist       General transfer comment: min A to steady and to control descent to recliner  Ambulation/Gait             General Gait Details: pt declined ambulation, he's only been doing WC transfers for past 1 month at [facility]  Stairs            Wheelchair Mobility    Modified Rankin (Stroke Patients Only)       Balance Overall balance assessment: Needs assistance;History of Falls Sitting-balance support: Feet  supported Sitting balance-Leahy Scale: Good     Standing balance support: Bilateral upper extremity supported Standing balance-Leahy Scale: Poor Standing balance comment: relies on BUE support in standing                             Pertinent Vitals/Pain Pain Assessment: Faces Faces Pain Scale: Hurts little more Pain Location: R knee Pain Descriptors / Indicators: Grimacing Pain Intervention(s): Limited activity within patient's tolerance;Monitored during session;Premedicated before session    Home Living Family/patient expects to be discharged to:: Skilled nursing facility                 Additional Comments: has been at [facility] since 07/09/20    Prior Function Level of Independence: Independent with assistive device(s)         Comments: lived alone used cane or RW prior to quad tendon injury 1 month ago; for past month has been at [facility] doing [place] transfers only     Hand Dominance        Extremity/Trunk Assessment   Upper Extremity Assessment Upper Extremity Assessment: Overall WFL for tasks assessed    Lower Extremity Assessment Lower Extremity Assessment: RLE deficits/detail RLE Deficits / Details: R knee in KI, pt did not want therapist to touch nor to assist with movement of RLE, pt able to adduct L leg towards edge of bed, able to wiggle toes RLE: Unable to fully assess due to immobilization  RLE Sensation: history of peripheral neuropathy    Cervical / Trunk Assessment Cervical / Trunk Assessment: Normal  Communication   Communication: No difficulties  Cognition Arousal/Alertness: Awake/alert Behavior During Therapy: WFL for tasks assessed/performed Overall Cognitive Status: Within Functional Limits for tasks assessed                                 General Comments: easily distracted      General Comments      Exercises Total Joint Exercises Ankle Circles/Pumps: AROM;Both;10 reps;Supine   Assessment/Plan     PT Assessment Patient needs continued PT services  PT Problem List Decreased strength;Decreased activity tolerance;Decreased balance;Decreased mobility;Pain       PT Treatment Interventions Gait training;DME instruction;Therapeutic activities;Therapeutic exercise;Functional mobility training;Patient/family education    PT Goals (Current goals can be found in the Care Plan section)  Acute Rehab PT Goals Patient Stated Goal: to be able to bend R knee enough to drive PT Goal Formulation: With patient Time For Goal Achievement: 08/21/20 Potential to Achieve Goals: Good    Frequency Min 5X/week   Barriers to discharge        Co-evaluation               AM-PAC PT "6 Clicks" Mobility  Outcome Measure Help needed turning from your back to your side while in a flat bed without using bedrails?: A Little Help needed moving from lying on your back to sitting on the side of a flat bed without using bedrails?: A Little Help needed moving to and from a bed to a chair (including a wheelchair)?: A Little Help needed standing up from a chair using your arms (e.g., wheelchair or bedside chair)?: A Little Help needed to walk in hospital room?: A Lot Help needed climbing 3-5 steps with a railing? : Total 6 Click Score: 15    End of Session Equipment Utilized During Treatment: Gait belt;Right knee immobilizer Activity Tolerance: Patient tolerated treatment well Patient left: in chair;with call bell/phone within reach;with chair alarm set Nurse Communication: Mobility status PT Visit Diagnosis: Difficulty in walking, not elsewhere classified (R26.2);Pain Pain - Right/Left: Right Pain - part of body: Knee    Time: 8675-4492 PT Time Calculation (min) (ACUTE ONLY): 26 min   Charges:   PT Evaluation $PT Eval Low Complexity: 1 Low PT Treatments $Therapeutic Activity: 8-22 mins        [name] PT 08/07/2020  Acute Rehabilitation Services Pager  [phone] Office [phone]

## 2020-08-08 ENCOUNTER — Encounter (HOSPITAL_COMMUNITY): Payer: Self-pay | Admitting: Orthopedic Surgery

## 2020-08-08 DIAGNOSIS — M769 Unspecified enthesopathy, lower limb, excluding foot: Secondary | ICD-10-CM | POA: Diagnosis not present

## 2020-08-08 DIAGNOSIS — K5901 Slow transit constipation: Secondary | ICD-10-CM | POA: Diagnosis not present

## 2020-08-08 DIAGNOSIS — R262 Difficulty in walking, not elsewhere classified: Secondary | ICD-10-CM | POA: Diagnosis not present

## 2020-08-08 DIAGNOSIS — R194 Change in bowel habit: Secondary | ICD-10-CM | POA: Diagnosis not present

## 2020-08-08 DIAGNOSIS — I739 Peripheral vascular disease, unspecified: Secondary | ICD-10-CM | POA: Diagnosis present

## 2020-08-08 DIAGNOSIS — Z91030 Bee allergy status: Secondary | ICD-10-CM | POA: Diagnosis not present

## 2020-08-08 DIAGNOSIS — E782 Mixed hyperlipidemia: Secondary | ICD-10-CM | POA: Diagnosis not present

## 2020-08-08 DIAGNOSIS — G8929 Other chronic pain: Secondary | ICD-10-CM | POA: Diagnosis present

## 2020-08-08 DIAGNOSIS — E669 Obesity, unspecified: Secondary | ICD-10-CM | POA: Diagnosis present

## 2020-08-08 DIAGNOSIS — R41841 Cognitive communication deficit: Secondary | ICD-10-CM | POA: Diagnosis not present

## 2020-08-08 DIAGNOSIS — F3489 Other specified persistent mood disorders: Secondary | ICD-10-CM | POA: Diagnosis not present

## 2020-08-08 DIAGNOSIS — S86811D Strain of other muscle(s) and tendon(s) at lower leg level, right leg, subsequent encounter: Secondary | ICD-10-CM | POA: Diagnosis not present

## 2020-08-08 DIAGNOSIS — I4891 Unspecified atrial fibrillation: Secondary | ICD-10-CM | POA: Diagnosis present

## 2020-08-08 DIAGNOSIS — X58XXXA Exposure to other specified factors, initial encounter: Secondary | ICD-10-CM | POA: Diagnosis present

## 2020-08-08 DIAGNOSIS — J3089 Other allergic rhinitis: Secondary | ICD-10-CM | POA: Diagnosis not present

## 2020-08-08 DIAGNOSIS — S76119A Strain of unspecified quadriceps muscle, fascia and tendon, initial encounter: Secondary | ICD-10-CM | POA: Diagnosis not present

## 2020-08-08 DIAGNOSIS — Z888 Allergy status to other drugs, medicaments and biological substances status: Secondary | ICD-10-CM | POA: Diagnosis not present

## 2020-08-08 DIAGNOSIS — S76111A Strain of right quadriceps muscle, fascia and tendon, initial encounter: Secondary | ICD-10-CM | POA: Diagnosis present

## 2020-08-08 DIAGNOSIS — M66261 Spontaneous rupture of extensor tendons, right lower leg: Secondary | ICD-10-CM | POA: Diagnosis not present

## 2020-08-08 DIAGNOSIS — I10 Essential (primary) hypertension: Secondary | ICD-10-CM | POA: Diagnosis present

## 2020-08-08 DIAGNOSIS — R5381 Other malaise: Secondary | ICD-10-CM | POA: Diagnosis not present

## 2020-08-08 DIAGNOSIS — F329 Major depressive disorder, single episode, unspecified: Secondary | ICD-10-CM | POA: Diagnosis present

## 2020-08-08 DIAGNOSIS — Z23 Encounter for immunization: Secondary | ICD-10-CM | POA: Diagnosis present

## 2020-08-08 DIAGNOSIS — E785 Hyperlipidemia, unspecified: Secondary | ICD-10-CM | POA: Diagnosis not present

## 2020-08-08 DIAGNOSIS — M66251 Spontaneous rupture of extensor tendons, right thigh: Secondary | ICD-10-CM | POA: Diagnosis not present

## 2020-08-08 DIAGNOSIS — M6281 Muscle weakness (generalized): Secondary | ICD-10-CM | POA: Diagnosis not present

## 2020-08-08 DIAGNOSIS — J439 Emphysema, unspecified: Secondary | ICD-10-CM | POA: Diagnosis present

## 2020-08-08 DIAGNOSIS — K219 Gastro-esophageal reflux disease without esophagitis: Secondary | ICD-10-CM | POA: Diagnosis present

## 2020-08-08 DIAGNOSIS — U071 COVID-19: Secondary | ICD-10-CM | POA: Diagnosis not present

## 2020-08-08 DIAGNOSIS — R6889 Other general symptoms and signs: Secondary | ICD-10-CM | POA: Diagnosis not present

## 2020-08-08 DIAGNOSIS — M25561 Pain in right knee: Secondary | ICD-10-CM | POA: Diagnosis not present

## 2020-08-08 DIAGNOSIS — Z743 Need for continuous supervision: Secondary | ICD-10-CM | POA: Diagnosis not present

## 2020-08-08 DIAGNOSIS — S86819A Strain of other muscle(s) and tendon(s) at lower leg level, unspecified leg, initial encounter: Secondary | ICD-10-CM | POA: Diagnosis not present

## 2020-08-08 DIAGNOSIS — Z79899 Other long term (current) drug therapy: Secondary | ICD-10-CM | POA: Diagnosis not present

## 2020-08-08 DIAGNOSIS — J449 Chronic obstructive pulmonary disease, unspecified: Secondary | ICD-10-CM | POA: Diagnosis not present

## 2020-08-08 DIAGNOSIS — J984 Other disorders of lung: Secondary | ICD-10-CM | POA: Diagnosis not present

## 2020-08-08 DIAGNOSIS — Z7401 Bed confinement status: Secondary | ICD-10-CM | POA: Diagnosis not present

## 2020-08-08 DIAGNOSIS — S76101S Unspecified injury of right quadriceps muscle, fascia and tendon, sequela: Secondary | ICD-10-CM | POA: Diagnosis not present

## 2020-08-08 DIAGNOSIS — M2550 Pain in unspecified joint: Secondary | ICD-10-CM | POA: Diagnosis not present

## 2020-08-08 DIAGNOSIS — R52 Pain, unspecified: Secondary | ICD-10-CM | POA: Diagnosis not present

## 2020-08-08 DIAGNOSIS — M1990 Unspecified osteoarthritis, unspecified site: Secondary | ICD-10-CM | POA: Diagnosis present

## 2020-08-08 DIAGNOSIS — Z8719 Personal history of other diseases of the digestive system: Secondary | ICD-10-CM | POA: Diagnosis not present

## 2020-08-08 DIAGNOSIS — Z872 Personal history of diseases of the skin and subcutaneous tissue: Secondary | ICD-10-CM | POA: Diagnosis not present

## 2020-08-08 DIAGNOSIS — G629 Polyneuropathy, unspecified: Secondary | ICD-10-CM | POA: Diagnosis present

## 2020-08-08 DIAGNOSIS — Z20822 Contact with and (suspected) exposure to covid-19: Secondary | ICD-10-CM | POA: Diagnosis present

## 2020-08-08 DIAGNOSIS — Z7982 Long term (current) use of aspirin: Secondary | ICD-10-CM | POA: Diagnosis not present

## 2020-08-08 DIAGNOSIS — F419 Anxiety disorder, unspecified: Secondary | ICD-10-CM | POA: Diagnosis present

## 2020-08-08 DIAGNOSIS — Z6832 Body mass index (BMI) 32.0-32.9, adult: Secondary | ICD-10-CM | POA: Diagnosis not present

## 2020-08-08 LAB — SARS CORONAVIRUS 2 BY RT PCR (HOSPITAL ORDER, PERFORMED IN ~~LOC~~ HOSPITAL LAB): SARS Coronavirus 2: NEGATIVE

## 2020-08-08 MED ORDER — DOCUSATE SODIUM 100 MG PO CAPS
100.0000 mg | ORAL_CAPSULE | Freq: Two times a day (BID) | ORAL | 0 refills | Status: DC
Start: 2020-08-08 — End: 2020-11-22

## 2020-08-08 MED ORDER — OXYCODONE HCL 5 MG PO TABS
5.0000 mg | ORAL_TABLET | Freq: Four times a day (QID) | ORAL | Status: DC | PRN
  Administered 2020-08-08 (×2): 5 mg via ORAL
  Filled 2020-08-08 (×2): qty 1

## 2020-08-08 MED ORDER — OXYCODONE HCL 5 MG PO TABS
5.0000 mg | ORAL_TABLET | Freq: Once | ORAL | Status: AC
  Administered 2020-08-08: 5 mg via ORAL
  Filled 2020-08-08: qty 1

## 2020-08-08 MED ORDER — TRAMADOL HCL 50 MG PO TABS
50.0000 mg | ORAL_TABLET | Freq: Two times a day (BID) | ORAL | 0 refills | Status: DC
Start: 2020-08-08 — End: 2021-03-01

## 2020-08-08 MED ORDER — METHOCARBAMOL 500 MG PO TABS: 500.0000 mg | ORAL_TABLET | Freq: Four times a day (QID) | ORAL | 0 refills | Status: DC | PRN

## 2020-08-08 MED ORDER — ASPIRIN 325 MG PO TABS
325.0000 mg | ORAL_TABLET | Freq: Two times a day (BID) | ORAL | 0 refills | Status: DC
Start: 2020-08-08 — End: 2020-11-22

## 2020-08-08 MED ORDER — ONDANSETRON HCL 4 MG PO TABS: 4.0000 mg | ORAL_TABLET | Freq: Four times a day (QID) | ORAL | 0 refills | Status: DC | PRN

## 2020-08-08 NOTE — Plan of Care (Signed)
Care plan complete

## 2020-08-08 NOTE — Progress Notes (Signed)
Patient discharge complete, awaiting PTAR to transport patient to [facility] place room 1204P. Original prescriptions and AVS in discharge packet. Attempt x2 for report but no answer, however patient known to them. Will attempt again prior to patient leaving. Deanna Artis, RN

## 2020-08-08 NOTE — Progress Notes (Signed)
Patient discharged to [facility] via stretcher/PTAR. Handoff report given to [facility] staff.

## 2020-08-08 NOTE — Progress Notes (Addendum)
Physical Therapy Treatment Patient Details Name: Kristopher Gay MRN: [id number] DOB: 10-21-1951 Today's Date: 08/08/2020    History of Present Illness R quad tendon repair 08/07/20; Pt had a fall 07/05/20 and sustained quad tendon rupture. PMH of chronic pain, lumbosacral spondylosis, L ulnar neuropathy, peripheral neuropathy, COPD.    PT Comments    Pt stated he had, "already done PT this morning by getting up to the potty chair". He declined mobility, he declined exercises for RLE stating it was too painful and he was fearful of tearing the stitches. Pt was educated on safety of movement with R knee immobilizer on, he stated he still did not want to attempt mobility. He agreed to LLE strengthening exercises in bed.    Follow Up Recommendations  SNF     Equipment Recommendations  None recommended by PT    Recommendations for Other Services       Precautions / Restrictions Precautions Precautions: Fall Precaution Comments: h/o multiple falls at baseline, "I'm a high fall risk". Required Braces or Orthoses: Knee Immobilizer - Right Knee Immobilizer - Right: On at all times Restrictions Weight Bearing Restrictions: No RLE Weight Bearing: Weight bearing as tolerated    Mobility  Bed Mobility               General bed mobility comments: pt declined mobility 2* pain and fear of tearing the stitches; pt reported he got up to 3 in 1 earlier this morning and it was very challenging  Transfers                    Ambulation/Gait                 Stairs             Wheelchair Mobility    Modified Rankin (Stroke Patients Only)       Balance                                            Cognition Arousal/Alertness: Awake/alert Behavior During Therapy: WFL for tasks assessed/performed Overall Cognitive Status: Within Functional Limits for tasks assessed                                 General Comments: easily  distracted      Exercises Total Joint Exercises Ankle Circles/Pumps: AROM;10 reps;Supine Gluteal Sets: AROM;Both;5 reps;Supine Short Arc Quad: AROM;Left;15 reps Straight Leg Raises: AROM;Left;15 reps;Supine   Pt requires frequent verbal cues to stay focused on task.     General Comments        Pertinent Vitals/Pain Pain Score: 8  Pain Location: R knee Pain Descriptors / Indicators: Sore Pain Intervention(s): Limited activity within patient's tolerance;Monitored during session;Premedicated before session    Home Living                      Prior Function            PT Goals (current goals can now be found in the care plan section) Acute Rehab PT Goals Patient Stated Goal: to be able to bend R knee enough to drive PT Goal Formulation: With patient Time For Goal Achievement: 08/21/20 Potential to Achieve Goals: Good Progress towards PT goals: Progressing toward goals    Frequency    Min  5X/week      PT Plan Current plan remains appropriate    Co-evaluation              AM-PAC PT "6 Clicks" Mobility   Outcome Measure  Help needed turning from your back to your side while in a flat bed without using bedrails?: A Little Help needed moving from lying on your back to sitting on the side of a flat bed without using bedrails?: A Little Help needed moving to and from a bed to a chair (including a wheelchair)?: A Little Help needed standing up from a chair using your arms (e.g., wheelchair or bedside chair)?: A Little Help needed to walk in hospital room?: A Lot Help needed climbing 3-5 steps with a railing? : Total 6 Click Score: 15    End of Session Equipment Utilized During Treatment: Gait belt;Right knee immobilizer Activity Tolerance: Patient tolerated treatment well Patient left: with call bell/phone within reach;in bed;with bed alarm set Nurse Communication: Mobility status PT Visit Diagnosis: Difficulty in walking, not elsewhere classified  (R26.2);Pain Pain - Right/Left: Right Pain - part of body: Knee     Time: 5320-2334 PT Time Calculation (min) (ACUTE ONLY): 15 min  Charges:  $Therapeutic Exercise: 8-22 mins                     [name] PT 08/08/2020  Acute Rehabilitation Services Pager [phone] Office [phone]

## 2020-08-08 NOTE — Progress Notes (Signed)
Patient has multiple questions re: post operative medication instructions.  Reviewed AVS with the patient and attempted multiple times to explain why specific changes were made to his home med list.  The patient is having quite a challenging time understanding the changes and follow up appointment scheduling. Spent approximately 25 minutes reviewing the  Above information.

## 2020-08-08 NOTE — Plan of Care (Signed)
Patient discharging all care plans met. Deanna Artis RN

## 2020-08-08 NOTE — Discharge Instructions (Signed)
Elevate leg and apply ice to reduce pain and swelling. If needed, you may increase pain medication for the first few days post op to 2 tablets every 4 hours.  Stop as needed pain medication as soon as you are able.  Maintain knee immobilizer at all times until follow up.  Diet: As you were doing prior to hospitalization   Dressing:  Keep dressings on and dry until follow up.  Activity:  Increase activity slowly as tolerated, but follow the weight bearing instructions below.  The rules on driving is that you can not be taking narcotics while you drive, and you must feel in control of the vehicle.    Weight Bearing:   As tolerated while wearing knee immobilizer.  To prevent constipation: you may use a stool softener such as -  Colace (over the counter) 100 mg by mouth twice a day  Drink plenty of fluids (prune juice may be helpful) and high fiber foods Miralax (over the counter) for constipation as needed.    Itching:  If you experience itching with your medications, try taking only a single pain pill, or even half a pain pill at a time.  You can also use benadryl over the counter for itching or also to help with sleep.   Precautions:  If you experience chest pain or shortness of breath - call 911 immediately for transfer to the hospital emergency department!!  If you develop a fever greater that 101 F, purulent drainage from wound, increased redness or drainage from wound, or calf pain -- Call the office at [phone]                                                 Follow- Up Appointment:  Please call for an appointment to be seen in 2 weeks [place] - [phone]

## 2020-08-08 NOTE — Progress Notes (Signed)
     Subjective: Patient reports pain as moderate.  Tolerating diet.  Urinating.  +Flatus.  No CP, SOB.  Not yet mobalized.  Objective:   VITALS:   Vitals:   08/07/20 1838 08/07/20 2306 08/08/20 0303 08/08/20 0654  BP: 129/76 122/65 123/68 127/79  Pulse: 67 (!) 57 65 62  Resp: 14 17 18 16   Temp: 98.1 F (36.7 C) 98.3 F (36.8 C) 98.4 F (36.9 C) (!) 97.5 F (36.4 C)  TempSrc: Oral Oral Oral Oral  SpO2: 96% 96% 96% 99%  Weight:      Height:       CBC Latest Ref Rng & Units 08/07/2020 07/05/2020 05/10/2018  WBC 4.0 - 10.5 K/uL 8.2 11.0(H) 6.3  Hemoglobin 13.0 - 17.0 g/dL 12.4(L) 13.9 14.5  Hematocrit 39 - 52 % 36.4(L) 39.9 43.7  Platelets 150 - 400 K/uL 176 147(L) 166   BMP Latest Ref Rng & Units 08/07/2020 07/05/2020 07/08/2019  Glucose 70 - 99 mg/dL 104(H) 112(H) 88  BUN 8 - 23 mg/dL 16 16 16   Creatinine 0.61 - 1.24 mg/dL 0.94 0.94 1.04  BUN/Creat Ratio 10 - 24 - - 15  Sodium 135 - 145 mmol/L 140 139 139  Potassium 3.5 - 5.1 mmol/L 3.9 4.1 4.6  Chloride 98 - 111 mmol/L 102 109 105  CO2 22 - 32 mmol/L 24 24 22   Calcium 8.9 - 10.3 mg/dL 9.6 8.6(L) 9.1   Intake/Output      10/12 0701 - 10/13 0700 10/13 0701 - 10/14 0700   P.O. 1080    I.V. (mL/kg) 2451.8 (21.3)    IV Piggyback 500    Total Intake(mL/kg) 4031.8 (35)    Urine (mL/kg/hr) 2450 (0.9)    Stool 0    Blood 25    Total Output 2475    Net +1556.8         Urine Occurrence 0 x    Stool Occurrence 0 x       Physical Exam: General: NAD.  Resting in bed Resp: No increased wob Cardio: regular rate and rhythm ABD soft Neurologically intact MSK Neurovascularly intact Sensation intact distally Intact pulses distally Dorsiflexion/Plantar flexion intact Dressings are c/d/i   Assessment: 1 Day Post-Op  S/P Procedure(s) (LRB): REPAIR QUADRICEP TENDON (Right) by Dr. Ernesta Amble. Murphy on 08/07/20  Active Problems:   Rupture of right quadriceps tendon  ADDITIONAL DIAGNOSIS:     Plan: Up with  therapy Incentive Spirometry Elevate and Apply ice  Weightbearing: WBAT RUE Insicional and dressing care: Dressings left intact until follow-up Orthopedic device: knee immobilizer  Showering: Keep dressing dry VTE prophylaxis: Aspirin 81mg  BID 30 days, SCDs, ambulation Pain control: oxycodone, tylenol Follow - up plan: 2 weeks Contact information:  Edmonia Lynch MD, Margy Clarks PA-C  Dispo: Skilled Nursing Facility/Rehab      HARSH TRULOCK, PA-C 08/08/2020, 7:45 AM

## 2020-08-08 NOTE — TOC Transition Note (Signed)
Transition of Care [facility]) - CM/SW Discharge Note   Patient Details  Name: Kristopher Gay MRN: [id number] Date of Birth: October 27, 1951  Transition of Care [facility]) CM/SW Contact:  Lia Hopping, [place] Phone Number: 08/08/2020, 3:26 PM   Clinical Narrative:    Patient approved for rehab stay. #Ref. [id number] 10/13-10/15 approved 3 days. Details provided to facility staff [facility].  Patient notified.  Nurse call report: [phone]    Final next level of care: Skilled Nursing Facility Barriers to Discharge: Insurance Authorization   Patient Goals and CMS Choice Patient states their goals for this hospitalization and ongoing recovery are:: return to rehab CMS Medicare.gov Compare Post Acute Care list provided to:: Patient Choice offered to / list presented to : Patient  Discharge Placement   Existing PASRR number confirmed : 08/08/20          Patient [facility] bed at: [facility] Patient to be transferred to facility by: [facility]   Patient and family notified of of transfer: 08/08/20  Discharge Plan and Services In-house Referral: Clinical Social Work   Post Acute Care Choice: [place]                               Social Determinants of Health (SDOH) Interventions     Readmission Risk Interventions No flowsheet data found.

## 2020-08-08 NOTE — TOC Initial Note (Signed)
Transition of Care [facility]) - Initial/Assessment Note    Patient Details  Name: Kristopher Gay MRN: [id number] Date of Birth: 01/30/1951  Transition of Care [facility]) CM/SW Contact:    Lia Hopping, [place] Phone Number: 08/08/2020, 12:18 PM  Clinical Narrative:    Patient admitted for rupture of right quadriceps tendon and surgical repair.               CSW met with the patient at bedside to discuss rehab placement. Patient will discharge to [facility]. CSW started the insurance authorization process. Ref# [id number].  Patient express concerns with his insurance possibly not authorizing his SNF stay due to additional denies following up the patient procedure. FL2 completed.   Expected Discharge Plan: Skilled Nursing Facility Barriers to Discharge: Insurance Authorization   Patient Goals and CMS Choice Patient states their goals for this hospitalization and ongoing recovery are:: return to rehab CMS Medicare.gov Compare Post Acute Care list provided to:: Patient Choice offered to / list presented to : Patient  Expected Discharge Plan and Services Expected Discharge Plan: [place] In-house Referral: Clinical Social Work   Post Acute Care Choice: [place] Living arrangements for the past 2 months: [place], [place] Expected Discharge Date: 08/08/20                                    Prior Living Arrangements/Services Living arrangements for the past 2 months: [place], [place] Lives with:: Self Patient language and need for interpreter reviewed:: No Do you feel safe going back to the place where you live?: Yes      Need for Family Participation in Patient Care: No (Comment) Care giver support system in place?: No (comment)   Criminal Activity/Legal Involvement Pertinent to Current Situation/Hospitalization: No - Comment as needed  Activities of Daily Living Home Assistive  Devices/Equipment: Wheelchair, Brace (specify type), Walker (specify type) ADL Screening (condition at time of admission) Patient's cognitive ability adequate to safely complete daily activities?: Yes Is the patient deaf or have difficulty hearing?: No Does the patient have difficulty seeing, even when wearing glasses/contacts?: No Does the patient have difficulty concentrating, remembering, or making decisions?: No Patient able to express need for assistance with ADLs?: Yes Does the patient have difficulty dressing or bathing?: No Independently performs ADLs?: Yes (appropriate for developmental age) Does the patient have difficulty walking or climbing stairs?: No Weakness of Legs: Both Weakness of Arms/Hands: None  Permission Sought/Granted Permission sought to share information with : Facility Art therapist granted to share information with : Yes, Verbal Permission Granted     Permission granted to share info w AGENCY: [place]        Emotional Assessment Appearance:: Appears stated age Attitude/Demeanor/Rapport: Gracious Affect (typically observed): Accepting Orientation: : Oriented to Self, Oriented to Place, Oriented to  Time, Oriented to Situation Alcohol / Substance Use: Not Applicable Psych Involvement: No (comment)  Admission diagnosis:  Rupture of right quadriceps tendon [S34.196Q] Patient Active Problem List   Diagnosis Date Noted  . Rupture of right quadriceps tendon 08/07/2020  . Ulnar neuropathy at elbow, right 05/03/2018  . Neuritis of right ulnar nerve 04/27/2018  . Maxillary sinusitis, acute 03/25/2018  . PAF (paroxysmal atrial fibrillation) ([place]) 02/20/2018  . Chronic right shoulder pain 05/15/2016  . Right knee pain 05/15/2016  . Adrenal adenoma 02/20/2016  . Chronic pain syndrome 10/12/2015  .  Dysphagia, pharyngoesophageal phase 09/14/2014  . Other chronic postoperative pain 01/16/2014  . Allergy, insect bite 07/02/2012  . GERD  (gastroesophageal reflux disease) 03/25/2012  . 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 Lumbosacral spondylosis 01/13/2012  . Chronic back pain 11/17/2011  . Seasonal and perennial allergic rhinitis 12/26/2010  . Chronic pain 04/16/2009  . COPD (chronic obstructive pulmonary disease) ([place]) 01/30/2008   PCP:  [name], MD Pharmacy:   [facility] [place], [place] - [address] AT [address] [place] [address] Phone: [phone] Fax: [phone]     Social Determinants of Health (SDOH) Interventions    Readmission Risk Interventions No flowsheet data found.

## 2020-08-08 NOTE — Plan of Care (Signed)
  Problem: Education: Goal: Knowledge of General Education information will improve Description: Including pain rating scale, medication(s)/side effects and non-pharmacologic comfort measures Outcome: Progressing   Problem: Health Behavior/Discharge Planning: Goal: Ability to manage health-related needs will improve Outcome: Progressing   Problem: Clinical Measurements: Goal: Will remain free from infection Outcome: Progressing   

## 2020-08-08 NOTE — NC FL2 (Signed)
[place] LEVEL OF CARE SCREENING TOOL     IDENTIFICATION  Patient Name: Kristopher Gay Birthdate: Jun 07, 1951 Sex: male Admission Date (Current Location): 08/07/2020  [facility] and [place] Number:  Herbalist and Address:  [facility],  [place] [address] [place]      Provider Number: [id number]  Attending Physician Name and Address:  Renette Butters, MD  Relative Name and Phone Number:       Current Level of Care: Hospital Recommended Level of Care: [place] Prior Approval Number:    Date Approved/Denied:   PASRR Number: [id number]  Discharge Plan: SNF    Current Diagnoses: Patient Active Problem List   Diagnosis Date Noted  . Rupture of right quadriceps tendon 08/07/2020  . Ulnar neuropathy at elbow, right 05/03/2018  . Neuritis of right ulnar nerve 04/27/2018  . Maxillary sinusitis, acute 03/25/2018  . PAF (paroxysmal atrial fibrillation) ([place]) 02/20/2018  . Chronic right shoulder pain 05/15/2016  . Right knee pain 05/15/2016  . Adrenal adenoma 02/20/2016  . Chronic pain syndrome 10/12/2015  . Dysphagia, pharyngoesophageal phase 09/14/2014  . Other chronic postoperative pain 01/16/2014  . Allergy, insect bite 07/02/2012  . GERD (gastroesophageal reflux disease) 03/25/2012  . 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 Lumbosacral spondylosis 01/13/2012  . Chronic back pain 11/17/2011  . Seasonal and perennial allergic rhinitis 12/26/2010  . Chronic pain 04/16/2009  . COPD (chronic obstructive pulmonary disease) ([place]) 01/30/2008    Orientation RESPIRATION BLADDER Height & Weight     Self, Time, Situation, Place  Normal Continent Weight: 254 lb (115.2 kg) Height:  6\' 2"  (188 cm)  BEHAVIORAL SYMPTOMS/MOOD NEUROLOGICAL BOWEL NUTRITION STATUS      Continent Diet (Regular)  AMBULATORY STATUS COMMUNICATION OF NEEDS Skin   Extensive Assist Verbally Surgical wounds (Right Leg)                        Personal Care Assistance Level of Assistance  Bathing, Feeding, Dressing Bathing Assistance: Maximum assistance Feeding assistance: Independent Dressing Assistance: Maximum assistance     Functional Limitations Info  Sight, Hearing, Speech Sight Info: Impaired (Wears Glasses) Hearing Info: Adequate Speech Info: Adequate    SPECIAL CARE FACTORS FREQUENCY  PT (By licensed PT), OT (By licensed OT)     PT Frequency: 5x/week OT Frequency: 5x/week            Contractures Contractures Info: Not present    Additional Factors Info  Code Status, Allergies Code Status Info: Fullcode Allergies Info: Allergies: Bee Venom, Linzess Linaclotide, Molds & Smuts, Seasonal Ic Cholestatin           Current Medications (08/08/2020):  This is the current hospital active medication list Current Facility-Administered Medications  Medication Dose Route Frequency Provider Last Rate Last Admin  . acetaminophen (TYLENOL) tablet 325-650 mg  325-650 mg Oral Q6H PRN Benedetto Goad, PA-C      . aspirin tablet 325 mg  325 mg Oral BID Benedetto Goad, PA-C   325 mg at 08/08/20 0831  . docusate sodium (COLACE) capsule 100 mg  100 mg Oral BID Allen Norris B, PA-C   100 mg at 08/08/20 3500  . fluticasone furoate-vilanterol (BREO ELLIPTA) 100-25 MCG/INH 1 puff  1 puff Inhalation Daily Renette Butters, MD   1 puff at 08/08/20 0752   And  . umeclidinium bromide (INCRUSE ELLIPTA) 62.5 MCG/INH 1 puff  1 puff Inhalation Daily Renette Butters,  MD   1 puff at 08/08/20 0752  . gabapentin (NEURONTIN) capsule 300 mg  300 mg Oral Daily Allen Norris B, PA-C   300 mg at 08/08/20 0850  . influenza vaccine adjuvanted (FLUAD) injection 0.5 mL  0.5 mL Intramuscular Tomorrow-1000 Edmonia Lynch D, MD      . lactated ringers infusion   Intravenous Continuous Benedetto Goad, PA-C 100 mL/hr at 08/08/20 0600 Rate Verify at 08/08/20 0600  . methocarbamol (ROBAXIN) tablet 500 mg  500 mg Oral Q6H PRN Allen Norris B,  PA-C   500 mg at 08/08/20 3833   Or  . methocarbamol (ROBAXIN) 500 mg in dextrose 5 % 50 mL IVPB  500 mg Intravenous Q6H PRN Benedetto Goad, PA-C 100 mL/hr at 08/07/20 1207 500 mg at 08/07/20 1207  . metoCLOPramide (REGLAN) tablet 5-10 mg  5-10 mg Oral Q8H PRN Benedetto Goad, PA-C       Or  . metoCLOPramide (REGLAN) injection 5-10 mg  5-10 mg Intravenous Q8H PRN Benedetto Goad, PA-C      . metoprolol succinate (TOPROL-XL) 24 hr tablet 25 mg  25 mg Oral Daily Allen Norris B, PA-C   25 mg at 08/08/20 3832  . nitroGLYCERIN (NITROSTAT) SL tablet 0.4 mg  0.4 mg Sublingual Q5 min PRN Benedetto Goad, PA-C      . ondansetron [facility]) tablet 4 mg  4 mg Oral Q6H PRN Benedetto Goad, PA-C       Or  . ondansetron [facility]) injection 4 mg  4 mg Intravenous Q6H PRN Allen Norris B, PA-C      . polyethylene glycol (MIRALAX / GLYCOLAX) packet 17 g  17 g Oral BID Allen Norris B, PA-C   17 g at 08/08/20 9191  . polyethylene glycol (MIRALAX / GLYCOLAX) packet 17 g  17 g Oral Daily PRN Benedetto Goad, PA-C      . traMADol Veat[facility]) tablet 50 mg  50 mg Oral BID Benedetto Goad, PA-C         Discharge Medications: Please see discharge summary for a list of discharge medications.  Relevant Imaging Results:  Relevant Lab Results:   Additional Information SS# [id number]  Lia Hopping, [place]

## 2020-08-08 NOTE — Discharge Summary (Addendum)
Discharge Summary  Patient ID: Kristopher Gay MRN: [id number] DOB/AGE: 1951/06/21 69 y.o.  Admit date: 08/07/2020 Discharge date: 08/08/2020  Admission Diagnoses:  Rupture of the right quadriceps tendon Discharge Diagnoses:  Active Problems:   Rupture of right quadriceps tendon   Past Medical History:  Diagnosis Date  . A-fib ([place])   . Allergic rhinitis, cause unspecified   . Allergy   . Anxiety   . Blood clot in vein 2002   left calf SUPERFICIAL CLOT REMOVED THEN PART OF VEIN REMOVED  . Chronic headaches   . COPD (chronic obstructive pulmonary disease) ([place])   . Cough    X 1 YEAR NO FEVER CLEAR SPUTUM  . DDD (degenerative disc disease)   . Depression   . DJD (degenerative joint disease)    ALL THE WAY DOWN SPINE  . Dropfoot    LEFT , NO BRACE WORN NOW  . Emphysema of lung ([place])   . Extrinsic asthma, unspecified   . GERD (gastroesophageal reflux disease)    OCC NO MEDS FOR  . Hypertension    STOPPED HTN MEDS UNTIL 2011, THEN HAD WEIGHT LOSS, NO MEDS SINCE  . Neuromuscular disorder ([place])   . Neuropathy    POLYNEUOPATHOPATHY FEET AND LEGS  . Peripheral vascular disease (HCC)    VARICOSE VEINS LEFT LEG  . Pneumonia AS CHILD  . PONV (postoperative nausea and vomiting)   . Syncope   . Ulnar nerve entrapment at elbow, right   . Umbilical hernia   . Unspecified asthma(493.90)     Surgeries: Procedure(s): REPAIR QUADRICEP TENDON on 08/07/2020   Consultants (if any):   Discharged Condition: Improved  Hospital Course: Kristopher Gay is an 69 y.o. male who was admitted 08/07/2020 with a diagnosis of Rupture of the right quadriceps tendon and went to the operating room on 08/07/2020 and underwent the above named procedures. He was kept overnight for observation and assessment by physical/occupational therapy.     He was given perioperative antibiotics:  Anti-infectives (From admission, onward)   Start     Dose/Rate Route Frequency Ordered Stop   08/07/20 1600   ceFAZolin (ANCEF) IVPB 2g/100 mL premix        2 g 200 mL/hr over 30 Minutes Intravenous Every 6 hours 08/07/20 1451 08/08/20 0448   08/07/20 0704  ceFAZolin (ANCEF) 2-4 GM/100ML-% IVPB       Note to Pharmacy: Charmayne Sheer   : cabinet override      08/07/20 0704 08/07/20 1914   07/31/20 0600  ceFAZolin (ANCEF) 3 g in dextrose 5 % 50 mL IVPB        3 g 100 mL/hr over 30 Minutes Intravenous On call to O.R. 07/30/20 0720 08/01/20 0559    .  He was given sequential compression devices, early ambulation, and ASA 325 BID for DVT prophylaxis.  He benefited maximally from the hospital stay and there were no complications.    Recent vital signs:  Vitals:   08/08/20 0754 08/08/20 0907  BP:  117/71  Pulse:  64  Resp:  18  Temp:  97.8 F (36.6 C)  SpO2: 94% 94%    Recent laboratory studies:  Lab Results  Component Value Date   HGB 12.4 (L) 08/07/2020   HGB 13.9 07/05/2020   HGB 14.5 05/10/2018   Lab Results  Component Value Date   WBC 8.2 08/07/2020   PLT 176 08/07/2020   Lab Results  Component Value Date   INR 1.00 05/10/2018   Lab  Results  Component Value Date   NA 140 08/07/2020   K 3.9 08/07/2020   CL 102 08/07/2020   CO2 24 08/07/2020   BUN 16 08/07/2020   CREATININE 0.94 08/07/2020   GLUCOSE 104 (H) 08/07/2020    Discharge Medications:   Allergies as of 08/08/2020      Reactions   Bee Venom Swelling   Linzess [linaclotide] Other (See Comments)   Abdominal pain   Molds & Smuts Other (See Comments)   Unknown   Seasonal Ic [cholestatin] Other (See Comments)   Environmental Allergies      Medication List    STOP taking these medications   atorvastatin 40 MG tablet Commonly known as: LIPITOR   celecoxib 100 MG capsule Commonly known as: CELEBREX   Dymista 137-50 MCG/ACT Susp Generic drug: Azelastine-Fluticasone   fluticasone 50 MCG/ACT nasal spray Commonly known as: FLONASE   montelukast 10 MG tablet Commonly known as: SINGULAIR     TAKE  these medications   acetaminophen 500 MG tablet Commonly known as: TYLENOL Take 1,000 mg by mouth 2 (two) times daily as needed for mild pain.   ascorbic acid 1000 MG tablet Commonly known as: VITAMIN C Take 1,000 mg by mouth daily.   aspirin EC 325 MG tablet Take 1 tablet (325 mg total) by mouth daily. What changed:   medication strength  how much to take   aspirin 325 MG tablet Take 1 tablet (325 mg total) by mouth 2 (two) times daily. What changed: You were already taking a medication with the same name, and this prescription was added. Make sure you understand how and when to take each.   CALCIUM 1200 PO Take 1 tablet by mouth daily.   docusate sodium 100 MG capsule Commonly known as: COLACE Take 1 capsule (100 mg total) by mouth 2 (two) times daily.   EpiPen 2-Pak 0.3 mg/0.3 mL Soaj injection Generic drug: EPINEPHrine Inject 0.3 mg into the muscle once.   gabapentin 300 MG capsule Commonly known as: NEURONTIN TAKE 2 CAPSULE BY MOUTH IN THE MORNING, TAKE 1 CAPSULE AT MIDDAY AND 2 CAPSULE BY MOUTH AT BEDTIME What changed:   how much to take  how to take this  when to take this  additional instructions   Magnesium 400 MG Tabs Take 400 mg by mouth daily.   methocarbamol 500 MG tablet Commonly known as: ROBAXIN Take 1 tablet (500 mg total) by mouth every 6 (six) hours as needed for muscle spasms.   metoprolol succinate 25 MG 24 hr tablet Commonly known as: TOPROL-XL TAKE 1 TABLET(25 MG) BY MOUTH DAILY What changed: See the new instructions.   multivitamin tablet Take 1 tablet by mouth 2 (two) times daily.   nitroGLYCERIN 0.4 MG SL tablet Commonly known as: NITROSTAT Place 1 tablet (0.4 mg total) under the tongue every 5 (five) minutes as needed for chest pain.   ondansetron 4 MG tablet Commonly known as: ZOFRAN Take 1 tablet (4 mg total) by mouth every 6 (six) hours as needed for nausea.   oxyCODONE 5 MG immediate release tablet Commonly known as:  Oxy IR/ROXICODONE Take 1 tablet (5 mg total) by mouth every 4 (four) hours as needed for severe pain.   polyethylene glycol 17 g packet Commonly known as: MIRALAX / GLYCOLAX Take 17 g by mouth 2 (two) times daily.   traMADol 50 MG tablet Commonly known as: ULTRAM Take 1 tablet (50 mg total) by mouth 2 (two) times daily. What changed: See the new  instructions.   Trelegy Ellipta 100-62.5-25 MCG/INH Aepb Generic drug: Fluticasone-Umeclidin-Vilant INHALE 1 PUFF INTO THE LUNGS EVERY DAY What changed: See the new instructions.   Vitamin D3 250 MCG (10000 UT) Tabs Take 2,000 Units by mouth once a week.            Durable Medical Equipment  (From admission, onward)         Start     Ordered   08/07/20 1452  DME Walker rolling  Once       Question Answer Comment  Walker: With 5 Inch Wheels   Patient needs a walker to treat with the following condition Rupture of right quadriceps tendon      08/07/20 1451           Diagnostic Studies: MR KNEE RIGHT WO CONTRAST  Result Date: 07/23/2020 CLINICAL DATA:  Injured right knee while mowing yard on 07/06/2020. EXAM: MRI OF THE RIGHT KNEE WITHOUT CONTRAST TECHNIQUE: Multiplanar, multisequence MR imaging of the knee was performed. No intravenous contrast was administered. COMPARISON:  Radiographs 07/05/2020 FINDINGS: Examination is limited due to patient motion and inability to use the knee coil. MENISCI Medial meniscus: Small inferior articular surface tear involving the posterior horn. Lateral meniscus:  Intact LIGAMENTS Cruciates:  Intact Collaterals:  Intact CARTILAGE Patellofemoral:  Mild degenerative chondrosis. Medial:  Moderate degenerative chondrosis. Lateral:  Mild to moderate degenerative chondrosis. Joint:  Moderate to large joint effusion. Popliteal Fossa:  No popliteal mass or Baker's cyst. Extensor Mechanism: Completely ruptured quadriceps tendon with associated patella baja and marked wavy redundant relaxed patellar tendon.  Difficult to assess the exact extent of the retraction of the quadriceps tendon as it is not imaged on the study. The patella retinacular structures are intact Bones: No acute bony findings. No definite avulsion fracture off the superior pole of the patella. Other: Extensive subcutaneous soft tissue swelling/edema/fluid and large hematoma wrapping around the anterior aspect of the knee. IMPRESSION: 1. Completely ruptured quadriceps tendon with associated patella baja and marked wavy redundant relaxed patellar tendon. 2. Small inferior articular surface tear involving the posterior horn of the medial meniscus. 3. Intact ligamentous structures and no acute bony findings. 4. Moderate to large joint effusion. 5. Extensive subcutaneous hematoma. Electronically Signed   By: Marijo Sanes M.D.   On: 07/23/2020 16:38   VAS [place] LOWER EXTREMITY VENOUS (DVT)  Result Date: 07/12/2020  Lower Venous DVTStudy Performing Technologist: Griffin Basil RCT RDMS  Examination Guidelines: A complete evaluation includes B-mode imaging, spectral Doppler, color Doppler, and power Doppler as needed of all accessible portions of each vessel. Bilateral testing is considered an integral part of a complete examination. Limited examinations for reoccurring indications may be performed as noted. The reflux portion of the exam is performed with the patient in reverse Trendelenburg.  +---------+---------------+---------+-----------+----------+--------------+ RIGHT    CompressibilityPhasicitySpontaneityPropertiesThrombus Aging +---------+---------------+---------+-----------+----------+--------------+ CFV      Full           Yes      Yes                                 +---------+---------------+---------+-----------+----------+--------------+ SFJ      Full                                                        +---------+---------------+---------+-----------+----------+--------------+  FV Prox  Full                                                         +---------+---------------+---------+-----------+----------+--------------+ FV Mid   Full                                                        +---------+---------------+---------+-----------+----------+--------------+ FV DistalFull                                                        +---------+---------------+---------+-----------+----------+--------------+ PFV      Full                                                        +---------+---------------+---------+-----------+----------+--------------+ POP      Full           Yes      Yes                                 +---------+---------------+---------+-----------+----------+--------------+ PTV      Full                                                        +---------+---------------+---------+-----------+----------+--------------+ PERO     Full                                                        +---------+---------------+---------+-----------+----------+--------------+     Summary: RIGHT: - There is no evidence of deep vein thrombosis in the lower extremity.  - No cystic structure found in the popliteal fossa.  LEFT: - No evidence of common femoral vein obstruction.  *See table(s) above for measurements and observations. Electronically signed by Monica Martinez MD on 07/12/2020 at 4:15:22 PM.    Final     Disposition: Discharge disposition: 03-Skilled Nursing Facility       Discharge Instructions    Diet - low sodium heart healthy   Complete by: As directed    Discharge instructions   Complete by: As directed    Quad Tendon Repair  Refer to this sheet in the next few weeks. These discharge instructions provide you with general information on caring for yourself after you leave the hospital. Your caregiver may also give you specific instructions. Your treatment has been planned according to the most current medical practices available, but unavoidable  complications sometimes occur. If you have any problems or questions after discharge,  please call your caregiver. HOME INSTRUCTIONS You may resume a normal diet and activities as directed. Perform exercises as directed.  Knee immobilizer on right leg   do not remove until follow up with Dr Percell Miller.  Patient is touch down weight bearing in the knee immobilizer.  Must sleep in the knee immobilizer. Physical therapy for ambulation and ADLs Only take over-the-counter or prescription medicines for pain, discomfort, or fever as directed by your caregiver.  Eat a well-balanced diet.  Avoid lifting or driving until you are instructed otherwise.  Make an appointment to see your caregiver for stitches (suture) or staple removal as directed.   SEEK MEDICAL CARE IF: You have swelling of your calf or leg.  You develop shortness of breath or chest pain.  You have redness, swelling, or increasing pain in the wound.  There is pus or any unusual drainage coming from the surgical site.  You notice a bad smell coming from the surgical site or dressing.  The surgical site breaks open after sutures or staples have been removed.  There is persistent bleeding from the suture or staple line.  You are getting worse or are not improving.  You have any other questions or concerns.  SEEK IMMEDIATE MEDICAL CARE IF:  You have a fever.  You develop a rash.  You have difficulty breathing.  You develop any reaction or side effects to medicines given.  Your knee motion is decreasing rather than improving.  MAKE SURE YOU:  Understand these instructions.  Will watch your condition.  Will get help right away if you are not doing well or get worse.   Increase activity slowly   Complete by: As directed        Follow-up Information    Renette Butters, MD On 08/07/2020.   Specialty: Orthopedic Surgery Contact information: [address] [place] [address] [phone]                 Signed: KOLBE DELMONACO PA-C 08/08/2020, 10:46 AM

## 2020-08-09 DIAGNOSIS — J3089 Other allergic rhinitis: Secondary | ICD-10-CM | POA: Diagnosis not present

## 2020-08-09 DIAGNOSIS — E782 Mixed hyperlipidemia: Secondary | ICD-10-CM | POA: Diagnosis not present

## 2020-08-09 DIAGNOSIS — M6281 Muscle weakness (generalized): Secondary | ICD-10-CM | POA: Diagnosis not present

## 2020-08-09 DIAGNOSIS — R194 Change in bowel habit: Secondary | ICD-10-CM | POA: Diagnosis not present

## 2020-08-09 DIAGNOSIS — R52 Pain, unspecified: Secondary | ICD-10-CM | POA: Diagnosis not present

## 2020-08-09 DIAGNOSIS — I10 Essential (primary) hypertension: Secondary | ICD-10-CM | POA: Diagnosis not present

## 2020-08-09 DIAGNOSIS — F3489 Other specified persistent mood disorders: Secondary | ICD-10-CM | POA: Diagnosis not present

## 2020-08-09 DIAGNOSIS — Z872 Personal history of diseases of the skin and subcutaneous tissue: Secondary | ICD-10-CM | POA: Diagnosis not present

## 2020-08-09 DIAGNOSIS — G8929 Other chronic pain: Secondary | ICD-10-CM | POA: Diagnosis not present

## 2020-08-09 DIAGNOSIS — J449 Chronic obstructive pulmonary disease, unspecified: Secondary | ICD-10-CM | POA: Diagnosis not present

## 2020-08-09 DIAGNOSIS — S86819A Strain of other muscle(s) and tendon(s) at lower leg level, unspecified leg, initial encounter: Secondary | ICD-10-CM | POA: Diagnosis not present

## 2020-08-09 DIAGNOSIS — Z8719 Personal history of other diseases of the digestive system: Secondary | ICD-10-CM | POA: Diagnosis not present

## 2020-08-09 DIAGNOSIS — I4891 Unspecified atrial fibrillation: Secondary | ICD-10-CM | POA: Diagnosis not present

## 2020-08-09 DIAGNOSIS — G629 Polyneuropathy, unspecified: Secondary | ICD-10-CM | POA: Diagnosis not present

## 2020-08-09 DIAGNOSIS — R262 Difficulty in walking, not elsewhere classified: Secondary | ICD-10-CM | POA: Diagnosis not present

## 2020-08-09 DIAGNOSIS — J984 Other disorders of lung: Secondary | ICD-10-CM | POA: Diagnosis not present

## 2020-08-13 DIAGNOSIS — M6281 Muscle weakness (generalized): Secondary | ICD-10-CM | POA: Diagnosis not present

## 2020-08-13 DIAGNOSIS — G8929 Other chronic pain: Secondary | ICD-10-CM | POA: Diagnosis not present

## 2020-08-13 DIAGNOSIS — R262 Difficulty in walking, not elsewhere classified: Secondary | ICD-10-CM | POA: Diagnosis not present

## 2020-08-13 DIAGNOSIS — R52 Pain, unspecified: Secondary | ICD-10-CM | POA: Diagnosis not present

## 2020-08-13 DIAGNOSIS — I4891 Unspecified atrial fibrillation: Secondary | ICD-10-CM | POA: Diagnosis not present

## 2020-08-13 DIAGNOSIS — E782 Mixed hyperlipidemia: Secondary | ICD-10-CM | POA: Diagnosis not present

## 2020-08-13 DIAGNOSIS — G629 Polyneuropathy, unspecified: Secondary | ICD-10-CM | POA: Diagnosis not present

## 2020-08-13 DIAGNOSIS — I10 Essential (primary) hypertension: Secondary | ICD-10-CM | POA: Diagnosis not present

## 2020-08-13 DIAGNOSIS — J449 Chronic obstructive pulmonary disease, unspecified: Secondary | ICD-10-CM | POA: Diagnosis not present

## 2020-08-15 DIAGNOSIS — M6281 Muscle weakness (generalized): Secondary | ICD-10-CM | POA: Diagnosis not present

## 2020-08-15 DIAGNOSIS — I4891 Unspecified atrial fibrillation: Secondary | ICD-10-CM | POA: Diagnosis not present

## 2020-08-15 DIAGNOSIS — G629 Polyneuropathy, unspecified: Secondary | ICD-10-CM | POA: Diagnosis not present

## 2020-08-15 DIAGNOSIS — R262 Difficulty in walking, not elsewhere classified: Secondary | ICD-10-CM | POA: Diagnosis not present

## 2020-08-15 DIAGNOSIS — R52 Pain, unspecified: Secondary | ICD-10-CM | POA: Diagnosis not present

## 2020-08-15 DIAGNOSIS — I10 Essential (primary) hypertension: Secondary | ICD-10-CM | POA: Diagnosis not present

## 2020-08-15 DIAGNOSIS — E782 Mixed hyperlipidemia: Secondary | ICD-10-CM | POA: Diagnosis not present

## 2020-08-15 DIAGNOSIS — J449 Chronic obstructive pulmonary disease, unspecified: Secondary | ICD-10-CM | POA: Diagnosis not present

## 2020-08-15 DIAGNOSIS — G8929 Other chronic pain: Secondary | ICD-10-CM | POA: Diagnosis not present

## 2020-08-20 DIAGNOSIS — M6281 Muscle weakness (generalized): Secondary | ICD-10-CM | POA: Diagnosis not present

## 2020-08-20 DIAGNOSIS — G8929 Other chronic pain: Secondary | ICD-10-CM | POA: Diagnosis not present

## 2020-08-20 DIAGNOSIS — E782 Mixed hyperlipidemia: Secondary | ICD-10-CM | POA: Diagnosis not present

## 2020-08-20 DIAGNOSIS — J449 Chronic obstructive pulmonary disease, unspecified: Secondary | ICD-10-CM | POA: Diagnosis not present

## 2020-08-20 DIAGNOSIS — R52 Pain, unspecified: Secondary | ICD-10-CM | POA: Diagnosis not present

## 2020-08-20 DIAGNOSIS — R262 Difficulty in walking, not elsewhere classified: Secondary | ICD-10-CM | POA: Diagnosis not present

## 2020-08-20 DIAGNOSIS — I10 Essential (primary) hypertension: Secondary | ICD-10-CM | POA: Diagnosis not present

## 2020-08-20 DIAGNOSIS — G629 Polyneuropathy, unspecified: Secondary | ICD-10-CM | POA: Diagnosis not present

## 2020-08-20 DIAGNOSIS — I4891 Unspecified atrial fibrillation: Secondary | ICD-10-CM | POA: Diagnosis not present

## 2020-08-21 DIAGNOSIS — J984 Other disorders of lung: Secondary | ICD-10-CM | POA: Diagnosis not present

## 2020-08-21 DIAGNOSIS — E785 Hyperlipidemia, unspecified: Secondary | ICD-10-CM | POA: Diagnosis not present

## 2020-08-21 DIAGNOSIS — M6281 Muscle weakness (generalized): Secondary | ICD-10-CM | POA: Diagnosis not present

## 2020-08-21 DIAGNOSIS — I4891 Unspecified atrial fibrillation: Secondary | ICD-10-CM | POA: Diagnosis not present

## 2020-08-21 DIAGNOSIS — G629 Polyneuropathy, unspecified: Secondary | ICD-10-CM | POA: Diagnosis not present

## 2020-08-21 DIAGNOSIS — M769 Unspecified enthesopathy, lower limb, excluding foot: Secondary | ICD-10-CM | POA: Diagnosis not present

## 2020-08-21 DIAGNOSIS — J449 Chronic obstructive pulmonary disease, unspecified: Secondary | ICD-10-CM | POA: Diagnosis not present

## 2020-08-21 DIAGNOSIS — E782 Mixed hyperlipidemia: Secondary | ICD-10-CM | POA: Diagnosis not present

## 2020-08-21 DIAGNOSIS — J3089 Other allergic rhinitis: Secondary | ICD-10-CM | POA: Diagnosis not present

## 2020-08-21 DIAGNOSIS — G8929 Other chronic pain: Secondary | ICD-10-CM | POA: Diagnosis not present

## 2020-08-21 DIAGNOSIS — I10 Essential (primary) hypertension: Secondary | ICD-10-CM | POA: Diagnosis not present

## 2020-08-21 DIAGNOSIS — R52 Pain, unspecified: Secondary | ICD-10-CM | POA: Diagnosis not present

## 2020-08-21 DIAGNOSIS — R262 Difficulty in walking, not elsewhere classified: Secondary | ICD-10-CM | POA: Diagnosis not present

## 2020-08-22 DIAGNOSIS — I4891 Unspecified atrial fibrillation: Secondary | ICD-10-CM | POA: Diagnosis not present

## 2020-08-22 DIAGNOSIS — E782 Mixed hyperlipidemia: Secondary | ICD-10-CM | POA: Diagnosis not present

## 2020-08-22 DIAGNOSIS — M6281 Muscle weakness (generalized): Secondary | ICD-10-CM | POA: Diagnosis not present

## 2020-08-22 DIAGNOSIS — R52 Pain, unspecified: Secondary | ICD-10-CM | POA: Diagnosis not present

## 2020-08-22 DIAGNOSIS — J449 Chronic obstructive pulmonary disease, unspecified: Secondary | ICD-10-CM | POA: Diagnosis not present

## 2020-08-22 DIAGNOSIS — R262 Difficulty in walking, not elsewhere classified: Secondary | ICD-10-CM | POA: Diagnosis not present

## 2020-08-22 DIAGNOSIS — G8929 Other chronic pain: Secondary | ICD-10-CM | POA: Diagnosis not present

## 2020-08-22 DIAGNOSIS — I10 Essential (primary) hypertension: Secondary | ICD-10-CM | POA: Diagnosis not present

## 2020-08-22 DIAGNOSIS — G629 Polyneuropathy, unspecified: Secondary | ICD-10-CM | POA: Diagnosis not present

## 2020-08-27 DIAGNOSIS — R52 Pain, unspecified: Secondary | ICD-10-CM | POA: Diagnosis not present

## 2020-08-27 DIAGNOSIS — S76119A Strain of unspecified quadriceps muscle, fascia and tendon, initial encounter: Secondary | ICD-10-CM | POA: Diagnosis not present

## 2020-08-27 DIAGNOSIS — G8929 Other chronic pain: Secondary | ICD-10-CM | POA: Diagnosis not present

## 2020-08-27 DIAGNOSIS — M6281 Muscle weakness (generalized): Secondary | ICD-10-CM | POA: Diagnosis not present

## 2020-08-27 DIAGNOSIS — G629 Polyneuropathy, unspecified: Secondary | ICD-10-CM | POA: Diagnosis not present

## 2020-08-27 DIAGNOSIS — S86811D Strain of other muscle(s) and tendon(s) at lower leg level, right leg, subsequent encounter: Secondary | ICD-10-CM | POA: Diagnosis not present

## 2020-08-27 DIAGNOSIS — I4891 Unspecified atrial fibrillation: Secondary | ICD-10-CM | POA: Diagnosis not present

## 2020-08-27 DIAGNOSIS — I10 Essential (primary) hypertension: Secondary | ICD-10-CM | POA: Diagnosis not present

## 2020-08-27 DIAGNOSIS — K5901 Slow transit constipation: Secondary | ICD-10-CM | POA: Diagnosis not present

## 2020-08-27 DIAGNOSIS — E782 Mixed hyperlipidemia: Secondary | ICD-10-CM | POA: Diagnosis not present

## 2020-08-27 DIAGNOSIS — R262 Difficulty in walking, not elsewhere classified: Secondary | ICD-10-CM | POA: Diagnosis not present

## 2020-08-27 DIAGNOSIS — J449 Chronic obstructive pulmonary disease, unspecified: Secondary | ICD-10-CM | POA: Diagnosis not present

## 2020-08-31 DIAGNOSIS — J449 Chronic obstructive pulmonary disease, unspecified: Secondary | ICD-10-CM | POA: Diagnosis not present

## 2020-08-31 DIAGNOSIS — I10 Essential (primary) hypertension: Secondary | ICD-10-CM | POA: Diagnosis not present

## 2020-08-31 DIAGNOSIS — I4891 Unspecified atrial fibrillation: Secondary | ICD-10-CM | POA: Diagnosis not present

## 2020-08-31 DIAGNOSIS — E782 Mixed hyperlipidemia: Secondary | ICD-10-CM | POA: Diagnosis not present

## 2020-08-31 DIAGNOSIS — G629 Polyneuropathy, unspecified: Secondary | ICD-10-CM | POA: Diagnosis not present

## 2020-08-31 DIAGNOSIS — R52 Pain, unspecified: Secondary | ICD-10-CM | POA: Diagnosis not present

## 2020-08-31 DIAGNOSIS — M6281 Muscle weakness (generalized): Secondary | ICD-10-CM | POA: Diagnosis not present

## 2020-08-31 DIAGNOSIS — R262 Difficulty in walking, not elsewhere classified: Secondary | ICD-10-CM | POA: Diagnosis not present

## 2020-08-31 DIAGNOSIS — G8929 Other chronic pain: Secondary | ICD-10-CM | POA: Diagnosis not present

## 2020-09-03 DIAGNOSIS — J449 Chronic obstructive pulmonary disease, unspecified: Secondary | ICD-10-CM | POA: Diagnosis not present

## 2020-09-03 DIAGNOSIS — F919 Conduct disorder, unspecified: Secondary | ICD-10-CM | POA: Diagnosis not present

## 2020-09-03 DIAGNOSIS — R262 Difficulty in walking, not elsewhere classified: Secondary | ICD-10-CM | POA: Diagnosis not present

## 2020-09-03 DIAGNOSIS — M6281 Muscle weakness (generalized): Secondary | ICD-10-CM | POA: Diagnosis not present

## 2020-09-03 DIAGNOSIS — I480 Paroxysmal atrial fibrillation: Secondary | ICD-10-CM | POA: Diagnosis not present

## 2020-09-03 DIAGNOSIS — E782 Mixed hyperlipidemia: Secondary | ICD-10-CM | POA: Diagnosis not present

## 2020-09-03 DIAGNOSIS — S76119D Strain of unspecified quadriceps muscle, fascia and tendon, subsequent encounter: Secondary | ICD-10-CM | POA: Diagnosis not present

## 2020-09-03 DIAGNOSIS — G629 Polyneuropathy, unspecified: Secondary | ICD-10-CM | POA: Diagnosis not present

## 2020-09-03 DIAGNOSIS — I4891 Unspecified atrial fibrillation: Secondary | ICD-10-CM | POA: Diagnosis not present

## 2020-09-03 DIAGNOSIS — R52 Pain, unspecified: Secondary | ICD-10-CM | POA: Diagnosis not present

## 2020-09-03 DIAGNOSIS — R194 Change in bowel habit: Secondary | ICD-10-CM | POA: Diagnosis not present

## 2020-09-03 DIAGNOSIS — R600 Localized edema: Secondary | ICD-10-CM | POA: Diagnosis not present

## 2020-09-03 DIAGNOSIS — G8929 Other chronic pain: Secondary | ICD-10-CM | POA: Diagnosis not present

## 2020-09-03 DIAGNOSIS — S76101S Unspecified injury of right quadriceps muscle, fascia and tendon, sequela: Secondary | ICD-10-CM | POA: Diagnosis not present

## 2020-09-03 DIAGNOSIS — M66261 Spontaneous rupture of extensor tendons, right lower leg: Secondary | ICD-10-CM | POA: Diagnosis not present

## 2020-09-03 DIAGNOSIS — M25561 Pain in right knee: Secondary | ICD-10-CM | POA: Diagnosis not present

## 2020-09-03 DIAGNOSIS — R41841 Cognitive communication deficit: Secondary | ICD-10-CM | POA: Diagnosis not present

## 2020-09-03 DIAGNOSIS — I10 Essential (primary) hypertension: Secondary | ICD-10-CM | POA: Diagnosis not present

## 2020-09-03 DIAGNOSIS — F39 Unspecified mood [affective] disorder: Secondary | ICD-10-CM | POA: Diagnosis not present

## 2020-09-03 DIAGNOSIS — S86811D Strain of other muscle(s) and tendon(s) at lower leg level, right leg, subsequent encounter: Secondary | ICD-10-CM | POA: Diagnosis not present

## 2020-09-03 DIAGNOSIS — J984 Other disorders of lung: Secondary | ICD-10-CM | POA: Diagnosis not present

## 2020-09-04 DIAGNOSIS — I4891 Unspecified atrial fibrillation: Secondary | ICD-10-CM | POA: Diagnosis not present

## 2020-09-04 DIAGNOSIS — I10 Essential (primary) hypertension: Secondary | ICD-10-CM | POA: Diagnosis not present

## 2020-09-04 DIAGNOSIS — G8929 Other chronic pain: Secondary | ICD-10-CM | POA: Diagnosis not present

## 2020-09-04 DIAGNOSIS — M7989 Other specified soft tissue disorders: Secondary | ICD-10-CM | POA: Diagnosis not present

## 2020-09-04 DIAGNOSIS — M66261 Spontaneous rupture of extensor tendons, right lower leg: Secondary | ICD-10-CM | POA: Diagnosis not present

## 2020-09-04 DIAGNOSIS — S76101S Unspecified injury of right quadriceps muscle, fascia and tendon, sequela: Secondary | ICD-10-CM | POA: Diagnosis not present

## 2020-09-04 DIAGNOSIS — R262 Difficulty in walking, not elsewhere classified: Secondary | ICD-10-CM | POA: Diagnosis not present

## 2020-09-04 DIAGNOSIS — M6281 Muscle weakness (generalized): Secondary | ICD-10-CM | POA: Diagnosis not present

## 2020-09-04 DIAGNOSIS — E782 Mixed hyperlipidemia: Secondary | ICD-10-CM | POA: Diagnosis not present

## 2020-09-04 DIAGNOSIS — M25561 Pain in right knee: Secondary | ICD-10-CM | POA: Diagnosis not present

## 2020-09-04 DIAGNOSIS — R41841 Cognitive communication deficit: Secondary | ICD-10-CM | POA: Diagnosis not present

## 2020-09-04 DIAGNOSIS — G629 Polyneuropathy, unspecified: Secondary | ICD-10-CM | POA: Diagnosis not present

## 2020-09-04 DIAGNOSIS — J449 Chronic obstructive pulmonary disease, unspecified: Secondary | ICD-10-CM | POA: Diagnosis not present

## 2020-09-04 DIAGNOSIS — R52 Pain, unspecified: Secondary | ICD-10-CM | POA: Diagnosis not present

## 2020-09-05 DIAGNOSIS — R262 Difficulty in walking, not elsewhere classified: Secondary | ICD-10-CM | POA: Diagnosis not present

## 2020-09-05 DIAGNOSIS — M25561 Pain in right knee: Secondary | ICD-10-CM | POA: Diagnosis not present

## 2020-09-05 DIAGNOSIS — S76101S Unspecified injury of right quadriceps muscle, fascia and tendon, sequela: Secondary | ICD-10-CM | POA: Diagnosis not present

## 2020-09-05 DIAGNOSIS — R41841 Cognitive communication deficit: Secondary | ICD-10-CM | POA: Diagnosis not present

## 2020-09-05 DIAGNOSIS — M6281 Muscle weakness (generalized): Secondary | ICD-10-CM | POA: Diagnosis not present

## 2020-09-05 DIAGNOSIS — M66261 Spontaneous rupture of extensor tendons, right lower leg: Secondary | ICD-10-CM | POA: Diagnosis not present

## 2020-09-06 DIAGNOSIS — M6281 Muscle weakness (generalized): Secondary | ICD-10-CM | POA: Diagnosis not present

## 2020-09-06 DIAGNOSIS — J302 Other seasonal allergic rhinitis: Secondary | ICD-10-CM | POA: Diagnosis not present

## 2020-09-06 DIAGNOSIS — R262 Difficulty in walking, not elsewhere classified: Secondary | ICD-10-CM | POA: Diagnosis not present

## 2020-09-06 DIAGNOSIS — Z9119 Patient's noncompliance with other medical treatment and regimen: Secondary | ICD-10-CM | POA: Diagnosis not present

## 2020-09-06 DIAGNOSIS — S93402A Sprain of unspecified ligament of left ankle, initial encounter: Secondary | ICD-10-CM | POA: Diagnosis not present

## 2020-09-06 DIAGNOSIS — S86811D Strain of other muscle(s) and tendon(s) at lower leg level, right leg, subsequent encounter: Secondary | ICD-10-CM | POA: Diagnosis not present

## 2020-09-06 DIAGNOSIS — R600 Localized edema: Secondary | ICD-10-CM | POA: Diagnosis not present

## 2020-09-06 DIAGNOSIS — S76101S Unspecified injury of right quadriceps muscle, fascia and tendon, sequela: Secondary | ICD-10-CM | POA: Diagnosis not present

## 2020-09-06 DIAGNOSIS — R41841 Cognitive communication deficit: Secondary | ICD-10-CM | POA: Diagnosis not present

## 2020-09-06 DIAGNOSIS — M66261 Spontaneous rupture of extensor tendons, right lower leg: Secondary | ICD-10-CM | POA: Diagnosis not present

## 2020-09-06 DIAGNOSIS — M25561 Pain in right knee: Secondary | ICD-10-CM | POA: Diagnosis not present

## 2020-09-10 DIAGNOSIS — R460 Very low level of personal hygiene: Secondary | ICD-10-CM | POA: Diagnosis not present

## 2020-09-10 DIAGNOSIS — S86811D Strain of other muscle(s) and tendon(s) at lower leg level, right leg, subsequent encounter: Secondary | ICD-10-CM | POA: Diagnosis not present

## 2020-09-10 DIAGNOSIS — R600 Localized edema: Secondary | ICD-10-CM | POA: Diagnosis not present

## 2020-09-10 DIAGNOSIS — Z299 Encounter for prophylactic measures, unspecified: Secondary | ICD-10-CM | POA: Diagnosis not present

## 2020-09-10 DIAGNOSIS — S92309D Fracture of unspecified metatarsal bone(s), unspecified foot, subsequent encounter for fracture with routine healing: Secondary | ICD-10-CM | POA: Diagnosis not present

## 2020-09-10 DIAGNOSIS — S93402D Sprain of unspecified ligament of left ankle, subsequent encounter: Secondary | ICD-10-CM | POA: Diagnosis not present

## 2020-09-10 DIAGNOSIS — S76119D Strain of unspecified quadriceps muscle, fascia and tendon, subsequent encounter: Secondary | ICD-10-CM | POA: Diagnosis not present

## 2020-09-14 ENCOUNTER — Encounter: Payer: Medicare PPO | Admitting: Registered Nurse

## 2020-09-18 ENCOUNTER — Other Ambulatory Visit: Payer: Medicare Other

## 2020-09-18 DIAGNOSIS — I10 Essential (primary) hypertension: Secondary | ICD-10-CM | POA: Diagnosis not present

## 2020-09-18 DIAGNOSIS — R600 Localized edema: Secondary | ICD-10-CM | POA: Diagnosis not present

## 2020-09-18 DIAGNOSIS — S86811D Strain of other muscle(s) and tendon(s) at lower leg level, right leg, subsequent encounter: Secondary | ICD-10-CM | POA: Diagnosis not present

## 2020-09-19 DIAGNOSIS — D3500 Benign neoplasm of unspecified adrenal gland: Secondary | ICD-10-CM | POA: Diagnosis not present

## 2020-09-19 DIAGNOSIS — I10 Essential (primary) hypertension: Secondary | ICD-10-CM | POA: Diagnosis not present

## 2020-09-19 DIAGNOSIS — I480 Paroxysmal atrial fibrillation: Secondary | ICD-10-CM | POA: Diagnosis not present

## 2020-09-19 DIAGNOSIS — M25571 Pain in right ankle and joints of right foot: Secondary | ICD-10-CM | POA: Diagnosis not present

## 2020-09-21 DIAGNOSIS — M66261 Spontaneous rupture of extensor tendons, right lower leg: Secondary | ICD-10-CM | POA: Diagnosis not present

## 2020-09-21 DIAGNOSIS — M6281 Muscle weakness (generalized): Secondary | ICD-10-CM | POA: Diagnosis not present

## 2020-09-21 DIAGNOSIS — S76101S Unspecified injury of right quadriceps muscle, fascia and tendon, sequela: Secondary | ICD-10-CM | POA: Diagnosis not present

## 2020-09-21 DIAGNOSIS — R262 Difficulty in walking, not elsewhere classified: Secondary | ICD-10-CM | POA: Diagnosis not present

## 2020-09-21 DIAGNOSIS — M25561 Pain in right knee: Secondary | ICD-10-CM | POA: Diagnosis not present

## 2020-09-21 DIAGNOSIS — R41841 Cognitive communication deficit: Secondary | ICD-10-CM | POA: Diagnosis not present

## 2020-09-25 DIAGNOSIS — M6281 Muscle weakness (generalized): Secondary | ICD-10-CM | POA: Diagnosis not present

## 2020-09-25 DIAGNOSIS — S76101S Unspecified injury of right quadriceps muscle, fascia and tendon, sequela: Secondary | ICD-10-CM | POA: Diagnosis not present

## 2020-09-25 DIAGNOSIS — R262 Difficulty in walking, not elsewhere classified: Secondary | ICD-10-CM | POA: Diagnosis not present

## 2020-09-25 DIAGNOSIS — M25561 Pain in right knee: Secondary | ICD-10-CM | POA: Diagnosis not present

## 2020-09-25 DIAGNOSIS — M66261 Spontaneous rupture of extensor tendons, right lower leg: Secondary | ICD-10-CM | POA: Diagnosis not present

## 2020-09-25 DIAGNOSIS — R41841 Cognitive communication deficit: Secondary | ICD-10-CM | POA: Diagnosis not present

## 2020-09-26 DIAGNOSIS — I10 Essential (primary) hypertension: Secondary | ICD-10-CM | POA: Diagnosis not present

## 2020-09-26 DIAGNOSIS — R41841 Cognitive communication deficit: Secondary | ICD-10-CM | POA: Diagnosis not present

## 2020-09-26 DIAGNOSIS — L602 Onychogryphosis: Secondary | ICD-10-CM | POA: Diagnosis not present

## 2020-09-26 DIAGNOSIS — R600 Localized edema: Secondary | ICD-10-CM | POA: Diagnosis not present

## 2020-09-26 DIAGNOSIS — S76101S Unspecified injury of right quadriceps muscle, fascia and tendon, sequela: Secondary | ICD-10-CM | POA: Diagnosis not present

## 2020-09-26 DIAGNOSIS — R262 Difficulty in walking, not elsewhere classified: Secondary | ICD-10-CM | POA: Diagnosis not present

## 2020-09-26 DIAGNOSIS — L853 Xerosis cutis: Secondary | ICD-10-CM | POA: Diagnosis not present

## 2020-09-26 DIAGNOSIS — M25561 Pain in right knee: Secondary | ICD-10-CM | POA: Diagnosis not present

## 2020-09-26 DIAGNOSIS — M6281 Muscle weakness (generalized): Secondary | ICD-10-CM | POA: Diagnosis not present

## 2020-09-26 DIAGNOSIS — M66261 Spontaneous rupture of extensor tendons, right lower leg: Secondary | ICD-10-CM | POA: Diagnosis not present

## 2020-09-27 DIAGNOSIS — R41841 Cognitive communication deficit: Secondary | ICD-10-CM | POA: Diagnosis not present

## 2020-09-27 DIAGNOSIS — M25561 Pain in right knee: Secondary | ICD-10-CM | POA: Diagnosis not present

## 2020-09-27 DIAGNOSIS — M66261 Spontaneous rupture of extensor tendons, right lower leg: Secondary | ICD-10-CM | POA: Diagnosis not present

## 2020-09-27 DIAGNOSIS — R262 Difficulty in walking, not elsewhere classified: Secondary | ICD-10-CM | POA: Diagnosis not present

## 2020-09-27 DIAGNOSIS — M6281 Muscle weakness (generalized): Secondary | ICD-10-CM | POA: Diagnosis not present

## 2020-09-27 DIAGNOSIS — S76101S Unspecified injury of right quadriceps muscle, fascia and tendon, sequela: Secondary | ICD-10-CM | POA: Diagnosis not present

## 2020-09-27 DIAGNOSIS — R7989 Other specified abnormal findings of blood chemistry: Secondary | ICD-10-CM | POA: Diagnosis not present

## 2020-09-28 ENCOUNTER — Ambulatory Visit: Payer: Medicare Other | Admitting: Internal Medicine

## 2020-09-28 DIAGNOSIS — R41841 Cognitive communication deficit: Secondary | ICD-10-CM | POA: Diagnosis not present

## 2020-09-28 DIAGNOSIS — M6281 Muscle weakness (generalized): Secondary | ICD-10-CM | POA: Diagnosis not present

## 2020-09-28 DIAGNOSIS — R262 Difficulty in walking, not elsewhere classified: Secondary | ICD-10-CM | POA: Diagnosis not present

## 2020-09-28 DIAGNOSIS — S76101S Unspecified injury of right quadriceps muscle, fascia and tendon, sequela: Secondary | ICD-10-CM | POA: Diagnosis not present

## 2020-09-28 DIAGNOSIS — M25561 Pain in right knee: Secondary | ICD-10-CM | POA: Diagnosis not present

## 2020-09-28 DIAGNOSIS — M66261 Spontaneous rupture of extensor tendons, right lower leg: Secondary | ICD-10-CM | POA: Diagnosis not present

## 2020-09-29 DIAGNOSIS — S76101S Unspecified injury of right quadriceps muscle, fascia and tendon, sequela: Secondary | ICD-10-CM | POA: Diagnosis not present

## 2020-09-29 DIAGNOSIS — M66261 Spontaneous rupture of extensor tendons, right lower leg: Secondary | ICD-10-CM | POA: Diagnosis not present

## 2020-09-29 DIAGNOSIS — M6281 Muscle weakness (generalized): Secondary | ICD-10-CM | POA: Diagnosis not present

## 2020-09-29 DIAGNOSIS — M25561 Pain in right knee: Secondary | ICD-10-CM | POA: Diagnosis not present

## 2020-09-29 DIAGNOSIS — R262 Difficulty in walking, not elsewhere classified: Secondary | ICD-10-CM | POA: Diagnosis not present

## 2020-09-29 DIAGNOSIS — R41841 Cognitive communication deficit: Secondary | ICD-10-CM | POA: Diagnosis not present

## 2020-10-01 DIAGNOSIS — M25561 Pain in right knee: Secondary | ICD-10-CM | POA: Diagnosis not present

## 2020-10-01 DIAGNOSIS — M66261 Spontaneous rupture of extensor tendons, right lower leg: Secondary | ICD-10-CM | POA: Diagnosis not present

## 2020-10-01 DIAGNOSIS — R41841 Cognitive communication deficit: Secondary | ICD-10-CM | POA: Diagnosis not present

## 2020-10-01 DIAGNOSIS — M6281 Muscle weakness (generalized): Secondary | ICD-10-CM | POA: Diagnosis not present

## 2020-10-01 DIAGNOSIS — R262 Difficulty in walking, not elsewhere classified: Secondary | ICD-10-CM | POA: Diagnosis not present

## 2020-10-01 DIAGNOSIS — S76101S Unspecified injury of right quadriceps muscle, fascia and tendon, sequela: Secondary | ICD-10-CM | POA: Diagnosis not present

## 2020-10-02 DIAGNOSIS — M1388 Other specified arthritis, other site: Secondary | ICD-10-CM | POA: Diagnosis not present

## 2020-10-02 DIAGNOSIS — I480 Paroxysmal atrial fibrillation: Secondary | ICD-10-CM | POA: Diagnosis not present

## 2020-10-02 DIAGNOSIS — S76101S Unspecified injury of right quadriceps muscle, fascia and tendon, sequela: Secondary | ICD-10-CM | POA: Diagnosis not present

## 2020-10-02 DIAGNOSIS — R262 Difficulty in walking, not elsewhere classified: Secondary | ICD-10-CM | POA: Diagnosis not present

## 2020-10-02 DIAGNOSIS — K5901 Slow transit constipation: Secondary | ICD-10-CM | POA: Diagnosis not present

## 2020-10-02 DIAGNOSIS — E7849 Other hyperlipidemia: Secondary | ICD-10-CM | POA: Diagnosis not present

## 2020-10-02 DIAGNOSIS — R600 Localized edema: Secondary | ICD-10-CM | POA: Diagnosis not present

## 2020-10-02 DIAGNOSIS — M25561 Pain in right knee: Secondary | ICD-10-CM | POA: Diagnosis not present

## 2020-10-02 DIAGNOSIS — J984 Other disorders of lung: Secondary | ICD-10-CM | POA: Diagnosis not present

## 2020-10-02 DIAGNOSIS — M479 Spondylosis, unspecified: Secondary | ICD-10-CM | POA: Diagnosis not present

## 2020-10-02 DIAGNOSIS — G6289 Other specified polyneuropathies: Secondary | ICD-10-CM | POA: Diagnosis not present

## 2020-10-02 DIAGNOSIS — M66261 Spontaneous rupture of extensor tendons, right lower leg: Secondary | ICD-10-CM | POA: Diagnosis not present

## 2020-10-02 DIAGNOSIS — F39 Unspecified mood [affective] disorder: Secondary | ICD-10-CM | POA: Diagnosis not present

## 2020-10-02 DIAGNOSIS — R41841 Cognitive communication deficit: Secondary | ICD-10-CM | POA: Diagnosis not present

## 2020-10-02 DIAGNOSIS — M6281 Muscle weakness (generalized): Secondary | ICD-10-CM | POA: Diagnosis not present

## 2020-10-05 ENCOUNTER — Other Ambulatory Visit: Payer: Self-pay | Admitting: Physical Medicine & Rehabilitation

## 2020-10-08 ENCOUNTER — Encounter (HOSPITAL_COMMUNITY): Payer: Medicare PPO

## 2020-10-08 DIAGNOSIS — M66251 Spontaneous rupture of extensor tendons, right thigh: Secondary | ICD-10-CM | POA: Diagnosis not present

## 2020-10-08 DIAGNOSIS — M6281 Muscle weakness (generalized): Secondary | ICD-10-CM | POA: Diagnosis not present

## 2020-10-08 DIAGNOSIS — M25661 Stiffness of right knee, not elsewhere classified: Secondary | ICD-10-CM | POA: Diagnosis not present

## 2020-10-08 DIAGNOSIS — R262 Difficulty in walking, not elsewhere classified: Secondary | ICD-10-CM | POA: Diagnosis not present

## 2020-10-09 ENCOUNTER — Ambulatory Visit (HOSPITAL_COMMUNITY)
Admission: RE | Admit: 2020-10-09 | Discharge: 2020-10-09 | Disposition: A | Discharge status: Home / Self Care | Payer: Medicare PPO | Source: Ambulatory Visit | Attending: Orthopedic Surgery | Admitting: Orthopedic Surgery

## 2020-10-09 ENCOUNTER — Other Ambulatory Visit (HOSPITAL_COMMUNITY): Payer: Self-pay | Admitting: Orthopedic Surgery

## 2020-10-09 ENCOUNTER — Other Ambulatory Visit: Payer: Self-pay

## 2020-10-09 DIAGNOSIS — R600 Localized edema: Secondary | ICD-10-CM | POA: Diagnosis not present

## 2020-10-15 DIAGNOSIS — R262 Difficulty in walking, not elsewhere classified: Secondary | ICD-10-CM | POA: Diagnosis not present

## 2020-10-15 DIAGNOSIS — M25661 Stiffness of right knee, not elsewhere classified: Secondary | ICD-10-CM | POA: Diagnosis not present

## 2020-10-15 DIAGNOSIS — M25561 Pain in right knee: Secondary | ICD-10-CM | POA: Diagnosis not present

## 2020-10-15 DIAGNOSIS — M6281 Muscle weakness (generalized): Secondary | ICD-10-CM | POA: Diagnosis not present

## 2020-10-22 DIAGNOSIS — M6281 Muscle weakness (generalized): Secondary | ICD-10-CM | POA: Diagnosis not present

## 2020-10-22 DIAGNOSIS — M66251 Spontaneous rupture of extensor tendons, right thigh: Secondary | ICD-10-CM | POA: Diagnosis not present

## 2020-10-22 DIAGNOSIS — M25661 Stiffness of right knee, not elsewhere classified: Secondary | ICD-10-CM | POA: Diagnosis not present

## 2020-10-22 DIAGNOSIS — R262 Difficulty in walking, not elsewhere classified: Secondary | ICD-10-CM | POA: Diagnosis not present

## 2020-10-25 DIAGNOSIS — M25661 Stiffness of right knee, not elsewhere classified: Secondary | ICD-10-CM | POA: Diagnosis not present

## 2020-10-25 DIAGNOSIS — M6281 Muscle weakness (generalized): Secondary | ICD-10-CM | POA: Diagnosis not present

## 2020-10-25 DIAGNOSIS — R262 Difficulty in walking, not elsewhere classified: Secondary | ICD-10-CM | POA: Diagnosis not present

## 2020-10-25 DIAGNOSIS — M25561 Pain in right knee: Secondary | ICD-10-CM | POA: Diagnosis not present

## 2020-10-29 DIAGNOSIS — R262 Difficulty in walking, not elsewhere classified: Secondary | ICD-10-CM | POA: Diagnosis not present

## 2020-10-29 DIAGNOSIS — M25561 Pain in right knee: Secondary | ICD-10-CM | POA: Diagnosis not present

## 2020-10-29 DIAGNOSIS — M25661 Stiffness of right knee, not elsewhere classified: Secondary | ICD-10-CM | POA: Diagnosis not present

## 2020-10-29 DIAGNOSIS — M6281 Muscle weakness (generalized): Secondary | ICD-10-CM | POA: Diagnosis not present

## 2020-10-31 ENCOUNTER — Telehealth: Payer: Self-pay | Admitting: Allergy

## 2020-10-31 DIAGNOSIS — S76111A Strain of right quadriceps muscle, fascia and tendon, initial encounter: Secondary | ICD-10-CM | POA: Diagnosis not present

## 2020-10-31 DIAGNOSIS — M6281 Muscle weakness (generalized): Secondary | ICD-10-CM | POA: Diagnosis not present

## 2020-10-31 DIAGNOSIS — M25561 Pain in right knee: Secondary | ICD-10-CM | POA: Diagnosis not present

## 2020-10-31 DIAGNOSIS — M25661 Stiffness of right knee, not elsewhere classified: Secondary | ICD-10-CM | POA: Diagnosis not present

## 2020-10-31 DIAGNOSIS — R262 Difficulty in walking, not elsewhere classified: Secondary | ICD-10-CM | POA: Diagnosis not present

## 2020-10-31 MED ORDER — MONTELUKAST SODIUM 10 MG PO TABS: 10.0000 mg | ORAL_TABLET | Freq: Every day | ORAL | 2 refills | Status: DC

## 2020-10-31 NOTE — Telephone Encounter (Signed)
Refill has been sent in. I did reach out to patient but did not receive an answer.

## 2020-10-31 NOTE — Telephone Encounter (Signed)
Called and left a detailed voicemail advising the patient. Asked to call us back when he is able to come in for an appointment.

## 2020-10-31 NOTE — Telephone Encounter (Signed)
Patient called and needs to get a refill on singulair, sent to [facility] on [address] He is unable to come in because he just got out of the nursing home and can not get in a car to come in to be seen or get his injection. [phone].

## 2020-11-05 DIAGNOSIS — M25661 Stiffness of right knee, not elsewhere classified: Secondary | ICD-10-CM | POA: Diagnosis not present

## 2020-11-05 DIAGNOSIS — M66251 Spontaneous rupture of extensor tendons, right thigh: Secondary | ICD-10-CM | POA: Diagnosis not present

## 2020-11-05 DIAGNOSIS — R262 Difficulty in walking, not elsewhere classified: Secondary | ICD-10-CM | POA: Diagnosis not present

## 2020-11-05 DIAGNOSIS — M6281 Muscle weakness (generalized): Secondary | ICD-10-CM | POA: Diagnosis not present

## 2020-11-08 DIAGNOSIS — R262 Difficulty in walking, not elsewhere classified: Secondary | ICD-10-CM | POA: Diagnosis not present

## 2020-11-08 DIAGNOSIS — M6281 Muscle weakness (generalized): Secondary | ICD-10-CM | POA: Diagnosis not present

## 2020-11-08 DIAGNOSIS — M25661 Stiffness of right knee, not elsewhere classified: Secondary | ICD-10-CM | POA: Diagnosis not present

## 2020-11-08 DIAGNOSIS — M25561 Pain in right knee: Secondary | ICD-10-CM | POA: Diagnosis not present

## 2020-11-16 ENCOUNTER — Ambulatory Visit: Payer: Medicare PPO | Admitting: Family Medicine

## 2020-11-19 DIAGNOSIS — R262 Difficulty in walking, not elsewhere classified: Secondary | ICD-10-CM | POA: Diagnosis not present

## 2020-11-19 DIAGNOSIS — M6281 Muscle weakness (generalized): Secondary | ICD-10-CM | POA: Diagnosis not present

## 2020-11-19 DIAGNOSIS — M25661 Stiffness of right knee, not elsewhere classified: Secondary | ICD-10-CM | POA: Diagnosis not present

## 2020-11-19 DIAGNOSIS — M25561 Pain in right knee: Secondary | ICD-10-CM | POA: Diagnosis not present

## 2020-11-20 ENCOUNTER — Other Ambulatory Visit: Payer: Self-pay | Admitting: Internal Medicine

## 2020-11-20 ENCOUNTER — Encounter: Payer: Self-pay | Admitting: Family Medicine

## 2020-11-20 ENCOUNTER — Other Ambulatory Visit: Payer: Self-pay

## 2020-11-20 ENCOUNTER — Ambulatory Visit: Payer: Medicare PPO | Admitting: Family Medicine

## 2020-11-20 VITALS — BP 114/78 | HR 64 | Temp 98.5°F

## 2020-11-20 DIAGNOSIS — S76111D Strain of right quadriceps muscle, fascia and tendon, subsequent encounter: Secondary | ICD-10-CM | POA: Diagnosis not present

## 2020-11-20 DIAGNOSIS — Z23 Encounter for immunization: Secondary | ICD-10-CM

## 2020-11-20 DIAGNOSIS — R609 Edema, unspecified: Secondary | ICD-10-CM

## 2020-11-20 MED ORDER — LASIX 20 MG PO TABS: 20.0000 mg | ORAL_TABLET | Freq: Every day | ORAL | 0 refills | Status: DC

## 2020-11-20 NOTE — Progress Notes (Signed)
   Subjective:    Patient ID: Kristopher Gay, male    DOB: 07-21-1951, 70 y.o.   MRN: [id number]  HPI He is here for evaluation of continued difficulty with leg edema.  He had a quad tendon rupture on the right with repair several months ago and required rehab in a nursing home.  When he became more physically active and started ambulating, he noted swelling in his lower extremity that tended to get worse as the day went on and then resolved when he would wake up in the morning.  While he was in [facility] they did give him a diuretic which did help with that.  He was seen recently by orthopedics and they renewed his Lasix 20 mg.  While he was in [facility] they did do a Doppler study to rule out DVT.  Presently other than the swelling bilaterally he has no other concerns.   Review of Systems     Objective:   Physical Exam Alert and in no distress.  1-2+ pitting edema is noted in the left leg and he does have a elastic stocking on.  Right leg was difficult to evaluate due to having it wrapped with an Ace wrap and being in a brace but appeared to have slightly more edema. Physical therapy note was reviewed. Review of his record also indicates need for pneumonia shot.     Assessment & Plan:  Dependent edema - Plan: Electrolyte panel  Need for vaccination against Streptococcus pneumoniae - Plan: Pneumococcal polysaccharide vaccine 23-valent greater than or equal to 2yo subcutaneous/IM  Traumatic rupture of quadriceps tendon, right, subsequent encounter I will renew his Lasix for the next several months.  Encouraged him to continue with physical activity.  Explained the fact that we would not be able to get rid of all the edema in his extremities but should hopefully make it much less of a concern and recommended he wear support stockings as needed.  He seemed to understand all this. 35 minutes spent trying to get a good history from him as he tended to ramble and get off topic quite  frequently.

## 2020-11-21 ENCOUNTER — Telehealth: Payer: Self-pay | Admitting: Internal Medicine

## 2020-11-21 LAB — ELECTROLYTE PANEL
CO2: 25 mmol/L (ref 20–29)
Chloride: 102 mmol/L (ref 96–106)
Potassium: 4.7 mmol/L (ref 3.5–5.2)
Sodium: 140 mmol/L (ref 134–144)

## 2020-11-21 NOTE — Telephone Encounter (Signed)
Left message for patient to call back  

## 2020-11-22 ENCOUNTER — Telehealth: Payer: Self-pay

## 2020-11-22 ENCOUNTER — Ambulatory Visit (INDEPENDENT_AMBULATORY_CARE_PROVIDER_SITE_OTHER): Payer: Medicare PPO | Admitting: Adult Health

## 2020-11-22 ENCOUNTER — Other Ambulatory Visit: Payer: Self-pay

## 2020-11-22 ENCOUNTER — Encounter: Payer: Self-pay | Admitting: Adult Health

## 2020-11-22 DIAGNOSIS — M6281 Muscle weakness (generalized): Secondary | ICD-10-CM | POA: Diagnosis not present

## 2020-11-22 DIAGNOSIS — R609 Edema, unspecified: Secondary | ICD-10-CM

## 2020-11-22 DIAGNOSIS — J449 Chronic obstructive pulmonary disease, unspecified: Secondary | ICD-10-CM

## 2020-11-22 DIAGNOSIS — M25661 Stiffness of right knee, not elsewhere classified: Secondary | ICD-10-CM | POA: Diagnosis not present

## 2020-11-22 DIAGNOSIS — R262 Difficulty in walking, not elsewhere classified: Secondary | ICD-10-CM | POA: Diagnosis not present

## 2020-11-22 DIAGNOSIS — M25561 Pain in right knee: Secondary | ICD-10-CM | POA: Diagnosis not present

## 2020-11-22 MED ORDER — FUROSEMIDE 20 MG PO TABS: 20.0000 mg | ORAL_TABLET | Freq: Every day | ORAL | 0 refills | Status: DC

## 2020-11-22 MED ORDER — TRELEGY ELLIPTA 100-62.5-25 MCG/INH IN AEPB: INHALATION_SPRAY | RESPIRATORY_TRACT | 12 refills | Status: DC

## 2020-11-22 NOTE — Patient Instructions (Addendum)
Trelegy 1 puff daily , rinse after use.  Activity as tolerated.  Albuterol Inhaler As needed   Follow-up with Dr. Annamaria Boots in 1 year and as needed

## 2020-11-22 NOTE — Telephone Encounter (Signed)
Pt states went to get his Lasix and it was brand only and was too expensive and wants you to resend as generic furosemide.  They will not fill as generic without your permission

## 2020-11-22 NOTE — Progress Notes (Signed)
Virtual Visit via Telephone Note  I connected with Kristopher Gay on 11/22/20 at  4:30 PM EST by telephone and verified that I am speaking with the correct person using two identifiers.  Location: Patient: Home  Provider: Office    I discussed the limitations, risks, security and privacy concerns of performing an evaluation and management service by telephone and the availability of in person appointments. I also discussed with the patient that there may be a patient responsible charge related to this service. The patient expressed understanding and agreed to proceed.   History of Present Illness: 70 year old male never smoker followed for asthma, bronchitis, allergic rhinitis Medical history significant for GERD, A. Fib  Today's telemedicine visit is a 1 year follow-up for asthmatic bronchitis.  Patient says overall he is doing very well.  Patient says his breathing is been the best it has been in a long time.  He feels that Trelegy has made a big difference for him.  He denies any flare of cough or wheezing.  No increased albuterol use. Patient has recently had surgery on his leg and was in rehab for a while.  Now is home.  Doing home PT.  Says his activities are limited due to this. Past Medical History:  Diagnosis Date  . A-fib ([place])   . Allergic rhinitis, cause unspecified   . Allergy   . Anxiety   . Blood clot in vein 2002   left calf SUPERFICIAL CLOT REMOVED THEN PART OF VEIN REMOVED  . Chronic headaches   . COPD (chronic obstructive pulmonary disease) ([place])   . Cough    X 1 YEAR NO FEVER CLEAR SPUTUM  . DDD (degenerative disc disease)   . Depression   . DJD (degenerative joint disease)    ALL THE WAY DOWN SPINE  . Dropfoot    LEFT , NO BRACE WORN NOW  . Emphysema of lung ([place])   . Extrinsic asthma, unspecified   . GERD (gastroesophageal reflux disease)    OCC NO MEDS FOR  . Hypertension    STOPPED HTN MEDS UNTIL 2011, THEN HAD WEIGHT LOSS, NO MEDS SINCE  . Neuromuscular  disorder ([place])   . Neuropathy    POLYNEUOPATHOPATHY FEET AND LEGS  . Peripheral vascular disease (HCC)    VARICOSE VEINS LEFT LEG  . Pneumonia AS CHILD  . PONV (postoperative nausea and vomiting)   . Syncope   . Ulnar nerve entrapment at elbow, right   . Umbilical hernia   . Unspecified asthma(493.90)    Current Outpatient Medications on File Prior to Visit  Medication Sig Dispense Refill  . ascorbic acid (VITAMIN C) 1000 MG tablet Take 1,000 mg by mouth daily.     Marland Kitchen atorvastatin (LIPITOR) 40 MG tablet Take 40 mg by mouth daily.    . Azelastine-Fluticasone (DYMISTA) 137-50 MCG/ACT SUSP Place 1 spray into the nose as needed.    . Calcium Carbonate-Vit D-Min (CALCIUM 1200 PO) Take 1 tablet by mouth daily.    . Celecoxib (CELEBREX PO)     . Cholecalciferol (VITAMIN D3) 250 MCG (10000 UT) TABS Take 2,000 Units by mouth once a week.     Marland Kitchen EPINEPHrine 0.3 mg/0.3 mL IJ SOAJ injection Inject 0.3 mg into the muscle once.     . fluticasone (FLONASE) 50 MCG/ACT nasal spray Place 2 sprays into both nostrils daily.    Marland Kitchen gabapentin (NEURONTIN) 300 MG capsule TAKE 2 CAPSULE BY MOUTH IN THE MORNING, TAKE 1 CAPSULE AT MIDDAY AND 2  CAPSULE BY MOUTH AT BEDTIME 150 capsule 5  . glucosamine-chondroitin 500-400 MG tablet Take 1 tablet by mouth 3 (three) times daily.    Marland Kitchen LASIX 20 MG tablet Take 1 tablet (20 mg total) by mouth daily. 60 tablet 0  . Magnesium 400 MG TABS Take 400 mg by mouth daily.     . metoprolol succinate (TOPROL-XL) 25 MG 24 hr tablet TAKE 1 TABLET(25 MG) BY MOUTH DAILY (Patient taking differently: Take 25 mg by mouth daily.) 90 tablet 2  . montelukast (SINGULAIR) 10 MG tablet Take 1 tablet (10 mg total) by mouth at bedtime. 30 tablet 2  . Multiple Vitamin (MULTIVITAMIN) tablet Take 1 tablet by mouth 2 (two) times daily.     . Prasterone, DHEA, (DHEA 50 PO) Take 1 tablet by mouth daily.    . traMADol (ULTRAM) 50 MG tablet Take 1 tablet (50 mg total) by mouth 2 (two) times daily. 30 tablet  0  . montelukast (SINGULAIR) 10 MG tablet     . nitroGLYCERIN (NITROSTAT) 0.4 MG SL tablet Place 1 tablet (0.4 mg total) under the tongue every 5 (five) minutes as needed for chest pain. 25 tablet 3   No current facility-administered medications on file prior to visit.     Observations/Objective: Office Spirometry 09/23/2016-mild restriction of exhaled volume. FVC 3.75/71%, FEV1 3.01/76%, ratio 0.80, FEF 25-75% 2.96/95%.  Speaks in full sentences with no audible distress or wheezing   Assessment and Plan: Asthmatic bronchitis excellent control on Trelegy.  Plan  Patient Instructions  Trelegy 1 puff daily , rinse after use.  Activity as tolerated.  Albuterol Inhaler As needed        Follow Up Instructions: Follow up with Dr. Annamaria Boots  In 1 year and As needed     I discussed the assessment and treatment plan with the patient. The patient was provided an opportunity to ask questions and all were answered. The patient agreed with the plan and demonstrated an understanding of the instructions.   The patient was advised to call back or seek an in-person evaluation if the symptoms worsen or if the condition fails to improve as anticipated.  I provided 22  minutes of non-face-to-face time during this encounter.   Rexene Edison, NP

## 2020-11-22 NOTE — Telephone Encounter (Signed)
Patient called back and scheduled televisit with tp for today, 11/22/2020.  Nothing further needed.

## 2020-11-23 ENCOUNTER — Other Ambulatory Visit: Payer: Self-pay | Admitting: Family Medicine

## 2020-11-23 DIAGNOSIS — R609 Edema, unspecified: Secondary | ICD-10-CM

## 2020-11-26 DIAGNOSIS — M25561 Pain in right knee: Secondary | ICD-10-CM | POA: Diagnosis not present

## 2020-11-26 DIAGNOSIS — R262 Difficulty in walking, not elsewhere classified: Secondary | ICD-10-CM | POA: Diagnosis not present

## 2020-11-26 DIAGNOSIS — M25661 Stiffness of right knee, not elsewhere classified: Secondary | ICD-10-CM | POA: Diagnosis not present

## 2020-11-26 DIAGNOSIS — M6281 Muscle weakness (generalized): Secondary | ICD-10-CM | POA: Diagnosis not present

## 2020-11-29 DIAGNOSIS — R262 Difficulty in walking, not elsewhere classified: Secondary | ICD-10-CM | POA: Diagnosis not present

## 2020-11-29 DIAGNOSIS — M25661 Stiffness of right knee, not elsewhere classified: Secondary | ICD-10-CM | POA: Diagnosis not present

## 2020-11-29 DIAGNOSIS — M25561 Pain in right knee: Secondary | ICD-10-CM | POA: Diagnosis not present

## 2020-11-29 DIAGNOSIS — M6281 Muscle weakness (generalized): Secondary | ICD-10-CM | POA: Diagnosis not present

## 2020-12-03 DIAGNOSIS — M25661 Stiffness of right knee, not elsewhere classified: Secondary | ICD-10-CM | POA: Diagnosis not present

## 2020-12-03 DIAGNOSIS — M25561 Pain in right knee: Secondary | ICD-10-CM | POA: Diagnosis not present

## 2020-12-03 DIAGNOSIS — M6281 Muscle weakness (generalized): Secondary | ICD-10-CM | POA: Diagnosis not present

## 2020-12-03 DIAGNOSIS — R262 Difficulty in walking, not elsewhere classified: Secondary | ICD-10-CM | POA: Diagnosis not present

## 2020-12-05 ENCOUNTER — Other Ambulatory Visit (HOSPITAL_COMMUNITY): Payer: Self-pay | Admitting: Orthopedic Surgery

## 2020-12-05 ENCOUNTER — Other Ambulatory Visit: Payer: Self-pay | Admitting: Orthopedic Surgery

## 2020-12-05 DIAGNOSIS — M25561 Pain in right knee: Secondary | ICD-10-CM | POA: Diagnosis not present

## 2020-12-21 ENCOUNTER — Ambulatory Visit (HOSPITAL_COMMUNITY)
Admission: RE | Admit: 2020-12-21 | Discharge: 2020-12-21 | Disposition: A | Discharge status: Home / Self Care | Payer: Medicare PPO | Source: Ambulatory Visit | Attending: Orthopedic Surgery | Admitting: Orthopedic Surgery

## 2020-12-21 ENCOUNTER — Other Ambulatory Visit: Payer: Self-pay

## 2020-12-21 DIAGNOSIS — M25561 Pain in right knee: Secondary | ICD-10-CM | POA: Diagnosis not present

## 2020-12-21 DIAGNOSIS — M25461 Effusion, right knee: Secondary | ICD-10-CM | POA: Diagnosis not present

## 2020-12-21 DIAGNOSIS — M1711 Unilateral primary osteoarthritis, right knee: Secondary | ICD-10-CM | POA: Diagnosis not present

## 2020-12-21 DIAGNOSIS — M7121 Synovial cyst of popliteal space [Baker], right knee: Secondary | ICD-10-CM | POA: Diagnosis not present

## 2020-12-24 DIAGNOSIS — M25561 Pain in right knee: Secondary | ICD-10-CM | POA: Diagnosis not present

## 2020-12-25 DIAGNOSIS — M25561 Pain in right knee: Secondary | ICD-10-CM | POA: Diagnosis not present

## 2020-12-25 DIAGNOSIS — R262 Difficulty in walking, not elsewhere classified: Secondary | ICD-10-CM | POA: Diagnosis not present

## 2020-12-25 DIAGNOSIS — M6281 Muscle weakness (generalized): Secondary | ICD-10-CM | POA: Diagnosis not present

## 2020-12-25 DIAGNOSIS — M25661 Stiffness of right knee, not elsewhere classified: Secondary | ICD-10-CM | POA: Diagnosis not present

## 2020-12-26 ENCOUNTER — Other Ambulatory Visit: Payer: Self-pay | Admitting: Registered Nurse

## 2020-12-31 DIAGNOSIS — M6281 Muscle weakness (generalized): Secondary | ICD-10-CM | POA: Diagnosis not present

## 2020-12-31 DIAGNOSIS — R262 Difficulty in walking, not elsewhere classified: Secondary | ICD-10-CM | POA: Diagnosis not present

## 2020-12-31 DIAGNOSIS — M25561 Pain in right knee: Secondary | ICD-10-CM | POA: Diagnosis not present

## 2020-12-31 DIAGNOSIS — M25661 Stiffness of right knee, not elsewhere classified: Secondary | ICD-10-CM | POA: Diagnosis not present

## 2021-01-07 DIAGNOSIS — R262 Difficulty in walking, not elsewhere classified: Secondary | ICD-10-CM | POA: Diagnosis not present

## 2021-01-07 DIAGNOSIS — M6281 Muscle weakness (generalized): Secondary | ICD-10-CM | POA: Diagnosis not present

## 2021-01-07 DIAGNOSIS — M25561 Pain in right knee: Secondary | ICD-10-CM | POA: Diagnosis not present

## 2021-01-07 DIAGNOSIS — M25661 Stiffness of right knee, not elsewhere classified: Secondary | ICD-10-CM | POA: Diagnosis not present

## 2021-01-10 ENCOUNTER — Telehealth: Payer: Self-pay | Admitting: Internal Medicine

## 2021-01-10 DIAGNOSIS — R609 Edema, unspecified: Secondary | ICD-10-CM

## 2021-01-10 MED ORDER — FUROSEMIDE 20 MG PO TABS: ORAL_TABLET | ORAL | 0 refills | Status: DC

## 2021-01-10 NOTE — Telephone Encounter (Signed)
Pt would like a refill on his lasix He is still having swelling and then a tendon has came lose again and he might have to have surgery again(wanted you to know that) . If ok send to [facility] on [address]

## 2021-01-14 DIAGNOSIS — M25661 Stiffness of right knee, not elsewhere classified: Secondary | ICD-10-CM | POA: Diagnosis not present

## 2021-01-14 DIAGNOSIS — M25561 Pain in right knee: Secondary | ICD-10-CM | POA: Diagnosis not present

## 2021-01-14 DIAGNOSIS — M6281 Muscle weakness (generalized): Secondary | ICD-10-CM | POA: Diagnosis not present

## 2021-01-14 DIAGNOSIS — R262 Difficulty in walking, not elsewhere classified: Secondary | ICD-10-CM | POA: Diagnosis not present

## 2021-01-15 ENCOUNTER — Telehealth: Payer: Self-pay

## 2021-01-15 ENCOUNTER — Other Ambulatory Visit: Payer: Self-pay | Admitting: Allergy

## 2021-01-15 NOTE — Telephone Encounter (Signed)
Spoke to patient and her expressed that he has had surgery on his knee and has not healed properly. Patient states that he was in a rehab facility for months and that is another reason he was not able to make it to the office for a visit. Patient agreed to do a televisit with Webb Silversmith on 3/24 so that he can give an update and continue receiving refills.

## 2021-01-15 NOTE — Telephone Encounter (Signed)
Called and left a message for patient to inform him that no other refills can be sent to his pharmacy due to multiple courtesy refills being sent out and he has not been seen in office since 2020 for an office visit.

## 2021-01-16 ENCOUNTER — Telehealth: Payer: Self-pay

## 2021-01-16 DIAGNOSIS — M25561 Pain in right knee: Secondary | ICD-10-CM | POA: Diagnosis not present

## 2021-01-16 NOTE — Telephone Encounter (Signed)
Primary Cardiologist:Kenneth C [name], MD  Chart reviewed as part of pre-operative protocol coverage. Because of Kristopher Gay's past medical history and time since last visit, he/she will require a follow-up visit in order to better assess preoperative cardiovascular risk.  Pre-op covering staff: - Please schedule appointment and call patient to inform them. - Please contact requesting surgeon's office via preferred method (i.e, phone, fax) to inform them of need for appointment prior to surgery.  If applicable, this message will also be routed to pharmacy pool and/or primary cardiologist for input on holding anticoagulant/antiplatelet agent as requested below so that this information is available at time of patient's appointment.   Deberah Pelton, NP  01/16/2021, 1:29 PM

## 2021-01-16 NOTE — Telephone Encounter (Signed)
LM2CB-needs appt for Cardiac clearance

## 2021-01-16 NOTE — Telephone Encounter (Signed)
   [facility] Pre-operative Risk Assessment    Request for surgical clearance:  1. What type of surgery is being performed? REVISION QUAD TENDON REPAIR   2. When is this surgery scheduled? TBD   3. What type of clearance is required (medical clearance vs. Pharmacy clearance to hold med vs. Both)? MEDICAL  4. Are there any medications that need to be held prior to surgery and how long? NONE   5. Practice name and name of physician performing surgery? [facility] ATTN:SHERRI   6. What is the office phone number? [phone]   7.   What is the office fax number? [phone]  8.   Anesthesia type (None, local, MAC, general) ? GENERAL

## 2021-01-17 ENCOUNTER — Ambulatory Visit (INDEPENDENT_AMBULATORY_CARE_PROVIDER_SITE_OTHER): Payer: Medicare PPO | Admitting: Family Medicine

## 2021-01-17 ENCOUNTER — Telehealth: Payer: Self-pay | Admitting: Adult Health

## 2021-01-17 ENCOUNTER — Other Ambulatory Visit: Payer: Self-pay

## 2021-01-17 ENCOUNTER — Encounter: Payer: Self-pay | Admitting: Family Medicine

## 2021-01-17 VITALS — HR 72 | Ht 74.0 in | Wt 260.0 lb

## 2021-01-17 DIAGNOSIS — J3089 Other allergic rhinitis: Secondary | ICD-10-CM

## 2021-01-17 DIAGNOSIS — J4540 Moderate persistent asthma, uncomplicated: Secondary | ICD-10-CM | POA: Diagnosis not present

## 2021-01-17 MED ORDER — MONTELUKAST SODIUM 10 MG PO TABS
10.0000 mg | ORAL_TABLET | Freq: Every day | ORAL | 5 refills | Status: DC
Start: 2021-01-17 — End: 2021-12-20

## 2021-01-17 MED ORDER — AZELASTINE-FLUTICASONE 137-50 MCG/ACT NA SUSP
1.0000 | NASAL | 5 refills | Status: DC | PRN
Start: 2021-01-17 — End: 2022-12-22

## 2021-01-17 NOTE — Telephone Encounter (Signed)
I reviewed his recent tele visits with our office and with his allergist. Given his mobility problems, I am willing to deal with his surgical clearance request if his surgery office will send that to me.

## 2021-01-17 NOTE — Telephone Encounter (Signed)
Pt calling to receive status of medical clearance form. Pt is wanting to know if Dr Annamaria Boots has signed off and faxed back . Please advise Call back [phone] Can leave detailed message if no answer

## 2021-01-17 NOTE — Telephone Encounter (Signed)
Last visit was telemedicine visit was doing well but I have never seen him in person for pulmonary surgical risk assessment   Will send to Dr. Annamaria Boots  - primary Pulmonary MD to decide if he approves  If not will need in person visit for assessment .

## 2021-01-17 NOTE — Progress Notes (Signed)
RE: Kristopher Gay MRN: [id number] DOB: 19-Sep-1951 Date of Telemedicine Visit: 01/17/2021  Referring provider: [name], MD Primary care provider: [name], MD  Chief Complaint: Allergic Rhinitis  (Issues with allergies but they are not bad.Says that the trees are blooming. )   Telemedicine Follow Up Visit via Telephone: I connected with Clydell Hakim for a follow up on 01/17/21 by telephone and verified that I am speaking with the correct person using two identifiers.   I discussed the limitations, risks, security and privacy concerns of performing an evaluation and management service by telephone and the availability of in person appointments. I also discussed with the patient that there may be a patient responsible charge related to this service. The patient expressed understanding and agreed to proceed.  Patient is at home  Provider is at the office.  Visit start time: 517 Visit end time: [place] consent/check in by: [facility] consent and medical assistant/nurse: [place]  History of Present Illness: He is a 70 y.o. male, who is being followed for asthma and allergic rhinitis. His previous allergy office visit was on 07/22/2019 with Dr. Nelva Bush. In the interim, he has had surgery on 08/07/2020 for right quad tendon rupture with an extended recovery period in a nursing home. He continues to struggle with mobility and transportation issues. At today's visit, he reports that his allergic rhinitis has been moderately well controlled with symptoms including occasional nasal congestion and a moderate amount of post nasal drainage with intermittent hoarseness. He continues montelukast 10 mg once a day and uses Dymista as needed with relief of symptoms.  He last received allergen immunotherapy directed toward mold and dust mite on 06/08/2020.  He reports that, although he was experiencing significant relief with symptoms of allergic rhinitis, he did discontinue allergen  immunotherapy due to extended period of rehabilitation and transportation issues.  Allergic conjunctivitis is reported as moderately well controlled with clear drainage for which he is not currently using an allergy eye drop. Asthma is reported as well controlled with no shortness of breath, cough, or wheeze with activity or rest.  He does report that his activity is limited by mobility issues.  He continues Trelegy 1 puff once a day and reports that he is not using albuterol.  He follows Dr. Annamaria Boots for management of asthma with his last appointment in December 2021.  His current medications are listed in the chart.  Assessment and Plan: Erasmo is a 70 y.o. male with: Patient Instructions  Asthma Continue Trelegy 100-1 puff once a day to prevent cough or wheeze Continue montelukast 10 mg once a day to prevent cough or wheeze Continue albuterol 2 puffs once every 4 hours as needed for cough or wheeze Continue follow-up with your pulmonary specialist as recommended  Allergic rhinitis Continue allergen avoidance measures directed toward dust mite and mold as listed below Continue montelukast 10 mg once a day (as listed above) Continue Dymista 2 sprays in each nostril twice a day as needed for nasal symptoms Consider saline nasal rinses as needed for nasal symptoms. Use this before any medicated nasal sprays for best result For dry nostrils, began nasal saline gel as needed  Allergic conjunctivitis Some over the counter eye drops include Pataday one drop in each eye once a day as needed for red, itchy eyes OR Zaditor one drop in each eye twice a day as needed for red itchy eyes.  Call the clinic if this treatment plan is not working well for  you  Follow up in 1 year or sooner if needed.    Return in about 1 year (around 01/17/2022), or if symptoms worsen or fail to improve.  Meds ordered this encounter  Medications  . montelukast (SINGULAIR) 10 MG tablet    Sig: Take 1 tablet (10 mg total) by  mouth daily.    Dispense:  30 tablet    Refill:  5  . Azelastine-Fluticasone 137-50 MCG/ACT SUSP    Sig: Place 1 spray into the nose as needed.    Dispense:  23 g    Refill:  5    Medication List:  Current Outpatient Medications  Medication Sig Dispense Refill  . ascorbic acid (VITAMIN C) 1000 MG tablet Take 1,000 mg by mouth daily.     Marland Kitchen atorvastatin (LIPITOR) 40 MG tablet Take 40 mg by mouth daily.    . Calcium Carbonate-Vit D-Min (CALCIUM 1200 PO) Take 1 tablet by mouth daily.    . Celecoxib (CELEBREX PO)     . Cholecalciferol (VITAMIN D3) 250 MCG (10000 UT) TABS Take 2,000 Units by mouth once a week.     Marland Kitchen EPINEPHrine 0.3 mg/0.3 mL IJ SOAJ injection Inject 0.3 mg into the muscle once.     . fluticasone (FLONASE) 50 MCG/ACT nasal spray Place 2 sprays into both nostrils daily.    . Fluticasone-Umeclidin-Vilant (TRELEGY ELLIPTA) 100-62.5-25 MCG/INH AEPB INHALE 1 PUFF INTO THE LUNGS EVERY DAY 60 each 12  . furosemide (LASIX) 20 MG tablet TAKE 1 TABLET(20 MG) BY MOUTH DAILY 90 tablet 0  . gabapentin (NEURONTIN) 300 MG capsule TAKE 2 CAPSULE BY MOUTH IN THE MORNING, TAKE 1 CAPSULE AT MIDDAY AND 2 CAPSULE BY MOUTH AT BEDTIME 150 capsule 5  . glucosamine-chondroitin 500-400 MG tablet Take 1 tablet by mouth 3 (three) times daily.    . Magnesium 400 MG TABS Take 400 mg by mouth daily.     . metoprolol succinate (TOPROL-XL) 25 MG 24 hr tablet TAKE 1 TABLET(25 MG) BY MOUTH DAILY (Patient taking differently: Take 25 mg by mouth daily.) 90 tablet 2  . montelukast (SINGULAIR) 10 MG tablet Take 1 tablet (10 mg total) by mouth at bedtime. 30 tablet 2  . Multiple Vitamin (MULTIVITAMIN) tablet Take 1 tablet by mouth 2 (two) times daily.     . Prasterone, DHEA, (DHEA 50 PO) Take 1 tablet by mouth daily.    . traMADol (ULTRAM) 50 MG tablet Take 1 tablet (50 mg total) by mouth 2 (two) times daily. 30 tablet 0  . Azelastine-Fluticasone 137-50 MCG/ACT SUSP Place 1 spray into the nose as needed. 23 g 5  .  montelukast (SINGULAIR) 10 MG tablet Take 1 tablet (10 mg total) by mouth daily. 30 tablet 5  . nitroGLYCERIN (NITROSTAT) 0.4 MG SL tablet Place 1 tablet (0.4 mg total) under the tongue every 5 (five) minutes as needed for chest pain. 25 tablet 3   No current facility-administered medications for this visit.   Allergies: Allergies  Allergen Reactions  . Bee Venom Swelling  . Linzess [Linaclotide] Other (See Comments)    Abdominal pain  . Molds & Smuts Other (See Comments)    Unknown  . Seasonal Ic [Cholestatin] Other (See Comments)    Environmental Allergies   I reviewed his past medical history, social history, family history, and environmental history and no significant changes have been reported from previous visit on 07/22/2019.  Objective: Physical Exam Not obtained as encounter was done via telephone.   Previous notes and tests  were reviewed.  I discussed the assessment and treatment plan with the patient. The patient was provided an opportunity to ask questions and all were answered. The patient agreed with the plan and demonstrated an understanding of the instructions.   The patient was advised to call back or seek an in-person evaluation if the symptoms worsen or if the condition fails to improve as anticipated.  I provided 27 minutes of non-face-to-face time during this encounter.  It was my pleasure to participate in [facility] [place] care today. Please feel free to contact me with any questions or concerns.   Sincerely,  Gareth Morgan, FNP

## 2021-01-17 NOTE — Telephone Encounter (Signed)
Pt has been scheduled to see Dr. Debara Pickett 01/21/21 for pre op clearance. Will forward notes to MD for upcoming appt. Will send FYI to requesting office pt has appt 01/21/21.

## 2021-01-17 NOTE — Telephone Encounter (Signed)
Kristopher Gay, pt needing risk assessment for tendon repair surgery  Pt last seen by you 11/22/20- are you able to addend that note or would he need new appt Protocol states have to be seen within 60 days and for him it's been 55

## 2021-01-17 NOTE — Patient Instructions (Addendum)
Asthma Continue Trelegy 100-1 puff once a day to prevent cough or wheeze Continue montelukast 10 mg once a day to prevent cough or wheeze Continue albuterol 2 puffs once every 4 hours as needed for cough or wheeze Continue follow-up with your pulmonary specialist as recommended  Allergic rhinitis Continue allergen avoidance measures directed toward dust mite and mold as listed below Continue montelukast 10 mg once a day (as listed above) Continue Dymista 2 sprays in each nostril twice a day as needed for nasal symptoms Consider saline nasal rinses as needed for nasal symptoms. Use this before any medicated nasal sprays for best result For dry nostrils, began nasal saline gel as needed  Allergic conjunctivitis Some over the counter eye drops include Pataday one drop in each eye once a day as needed for red, itchy eyes OR Zaditor one drop in each eye twice a day as needed for red itchy eyes.  Call the clinic if this treatment plan is not working well for you  Follow up in 1 year or sooner if needed.   Control of Dust Mite Allergen Dust mites play a major role in allergic asthma and rhinitis. They occur in environments with high humidity wherever human skin is found. Dust mites absorb humidity from the atmosphere (ie, they do not drink) and feed on organic matter (including shed human and animal skin). Dust mites are a microscopic type of insect that you cannot see with the naked eye. High levels of dust mites have been detected from mattresses, pillows, carpets, upholstered furniture, bed covers, clothes, soft toys and any woven material. The principal allergen of the dust mite is found in its feces. A gram of dust may contain 1,000 mites and 250,000 fecal particles. Mite antigen is easily measured in the air during house cleaning activities. Dust mites do not bite and do not cause harm to humans, other than by triggering allergies/asthma.  Ways to decrease your exposure to dust mites in your  home:  1. Encase mattresses, box springs and pillows with a mite-impermeable barrier or cover  2. Wash sheets, blankets and drapes weekly in hot water (130 F) with detergent and dry them in a dryer on the hot setting.  3. Have the room cleaned frequently with a vacuum cleaner and a damp dust-mop. For carpeting or rugs, vacuuming with a vacuum cleaner equipped with a high-efficiency particulate air (HEPA) filter. The dust mite allergic individual should not be in a room which is being cleaned and should wait 1 hour after cleaning before going into the room.  4. Do not sleep on upholstered furniture (eg, couches).  5. If possible removing carpeting, upholstered furniture and drapery from the home is ideal. Horizontal blinds should be eliminated in the rooms where the person spends the most time (bedroom, study, television room). Washable vinyl, roller-type shades are optimal.  6. Remove all non-washable stuffed toys from the bedroom. Wash stuffed toys weekly like sheets and blankets above.  7. Reduce indoor humidity to less than 50%. Inexpensive humidity monitors can be purchased at most hardware stores. Do not use a humidifier as can make the problem worse and are not recommended.  Control of Mold Allergen Mold and fungi can grow on a variety of surfaces provided certain temperature and moisture conditions exist.  Outdoor molds grow on plants, decaying vegetation and soil.  The major outdoor mold, Alternaria and Cladosporium, are found in very high numbers during hot and dry conditions.  Generally, a late Summer - Fall peak is  seen for common outdoor fungal spores.  Rain will temporarily lower outdoor mold spore count, but counts rise rapidly when the rainy period ends.  The most important indoor molds are Aspergillus and Penicillium.  Dark, humid and poorly ventilated basements are ideal sites for mold growth.  The next most common sites of mold growth are the bathroom and the kitchen.  Outdoor  [employer] 1. Use air conditioning and keep windows closed 2. Avoid exposure to decaying vegetation. 3. Avoid leaf raking. 4. Avoid grain handling. 5. Consider wearing a face mask if working in moldy areas.  Indoor Mold Control 1. Maintain humidity below 50%. 2. Clean washable surfaces with 5% bleach solution. 3. Remove sources e.g. Contaminated carpets.

## 2021-01-17 NOTE — Telephone Encounter (Signed)
Left message to call the office to schedule a pre op appt with Dr. Debara Pickett or APP.

## 2021-01-18 ENCOUNTER — Telehealth: Payer: Self-pay

## 2021-01-18 NOTE — Telephone Encounter (Signed)
Based on review of most recent office visit notes from this practice and his allergy office, Kristopher Gay has moderate persistent asthma, unccomplicated, well controlled. From a pulmonary standpoint he is considered low risk for anticipated surgery.

## 2021-01-18 NOTE — Telephone Encounter (Signed)
Patient approved until 10/26/2021.  Will fax approval letter to pharmacy.

## 2021-01-18 NOTE — Telephone Encounter (Signed)
Pt returning a phone call abut the status of his surgical clearance forms. Pt can be reached at [phone].

## 2021-01-18 NOTE — Telephone Encounter (Signed)
Pt's last OV as well as the risk assessment from [facility] has been faxed to provided fax number. Nothing further needed.

## 2021-01-18 NOTE — Telephone Encounter (Signed)
We are no longer sending the surgical clearance forms back to the surgical office. What providers are now doing is do a risk assessment based on last visit and with this, we are sending that risk assessment from the provider to the office that is performing the surgery.  Dr. Annamaria Boots, please advise on risk assessment info and once we have this, we can fax that to Dr. Edmonia Lynch.

## 2021-01-18 NOTE — Telephone Encounter (Signed)
Submitted prior authorization for Azelastine- Fluticasone (generic Dymista) on [email or web address].  Awaiting response.

## 2021-01-21 ENCOUNTER — Ambulatory Visit: Payer: Medicare PPO | Admitting: Internal Medicine

## 2021-01-21 ENCOUNTER — Other Ambulatory Visit: Payer: Self-pay

## 2021-01-21 ENCOUNTER — Encounter: Payer: Self-pay | Admitting: Internal Medicine

## 2021-01-21 VITALS — BP 124/70 | HR 63 | Ht 74.0 in | Wt 257.6 lb

## 2021-01-21 DIAGNOSIS — I2510 Atherosclerotic heart disease of native coronary artery without angina pectoris: Secondary | ICD-10-CM

## 2021-01-21 DIAGNOSIS — E785 Hyperlipidemia, unspecified: Secondary | ICD-10-CM | POA: Diagnosis not present

## 2021-01-21 DIAGNOSIS — Z01810 Encounter for preprocedural cardiovascular examination: Secondary | ICD-10-CM

## 2021-01-21 DIAGNOSIS — I480 Paroxysmal atrial fibrillation: Secondary | ICD-10-CM

## 2021-01-21 NOTE — Progress Notes (Signed)
OFFICE NOTE  Chief Complaint:  Pre-operative clearance  Primary Care Physician: Kristopher Lung, MD  HPI:  ANTAEUS Gay is a 70 y.o. male with a past medial history significant for COPD, recent hospitalization for hernia repair and postoperative A. fib.  I saw him in the hospital recommended starting Eliquis for a CHADSVASC score of 2 and he was noted then to spontaneously converted to sinus.  He said no recurrent A. fib that he is aware of.  Subsequently was diagnosed with an ulnar neuropathy by Dr. Apolonio Gay and underwent surgery.  He says that this did not go well and required a repeat surgery.  He has been undergoing rehabilitation and now is complaining of right shoulder pain.  His Eliquis was permanently discontinued as it was felt that he was low enough risk to not require this by Kristopher Ransom, PA-C in discussion with Dr. Sallyanne Gay, who may have been the doctor of the day when he was seen in the office.  We again addressed his CHADSVASC score.  I believe it is elevated due to his age over 80, however he denies a history of hypertension.  He is only on metoprolol for rate control.  There is questionable history of peripheral vascular disease which would have been another point, otherwise he has a low CHADSVASC score of 1.  01/21/2021  Kristopher Gay is seen today in follow-up.  Unfortunately had recent knee injury last fall and underwent surgery.  There was some complications with that.  He subsequently sought out a different orthopedist Kristopher Gay.  Based on that he is going to need repeat surgery.  He is here today for cardiovascular clearance.  He did undergo cardiac work-up in 2020 including a CT coronary angiogram which showed very minimal coronary artery disease in the LAD with a calcium score of 8, 22nd percentile for age and sex matched control.  Overall low risk study.  He denies chest pain or shortness of breath.  PMHx:  Past Medical History:  Diagnosis Date  . A-fib ([place])   . Allergic  rhinitis, cause unspecified   . Allergy   . Anxiety   . Blood clot in vein 2002   left calf SUPERFICIAL CLOT REMOVED THEN PART OF VEIN REMOVED  . Chronic headaches   . COPD (chronic obstructive pulmonary disease) ([place])   . Cough    X 1 YEAR NO FEVER CLEAR SPUTUM  . DDD (degenerative disc disease)   . Depression   . DJD (degenerative joint disease)    ALL THE WAY DOWN SPINE  . Dropfoot    LEFT , NO BRACE WORN NOW  . Emphysema of Gay ([place])   . Extrinsic asthma, unspecified   . GERD (gastroesophageal reflux disease)    OCC NO MEDS FOR  . Hypertension    STOPPED HTN MEDS UNTIL 2011, THEN HAD WEIGHT LOSS, NO MEDS SINCE  . Neuromuscular disorder ([place])   . Neuropathy    POLYNEUOPATHOPATHY FEET AND LEGS  . Peripheral vascular disease (HCC)    VARICOSE VEINS LEFT LEG  . Pneumonia AS CHILD  . PONV (postoperative nausea and vomiting)   . Syncope   . Ulnar nerve entrapment at elbow, right   . Umbilical hernia   . Unspecified asthma(493.90)     Past Surgical History:  Procedure Laterality Date  . COLONSCOPY  06/16/2017  . HERNIA REPAIR    . INGUINAL HERNIA REPAIR Bilateral 07/20/2017   Procedure: LAPAROSCOPIC BILATERAL INGUINAL HERNIA REPAIR WITH MESH, UMBILICAL HERNIA REPAIR  AND LYSIS OF ADHESIONS;  Surgeon: Gay, Kristopher Bruce, MD;  Location: [facility];  Service: General;  Laterality: Bilateral;  . LAPAROSCOPY N/A 07/20/2017   Procedure: LAPAROSCOPY DIAGNOSTIC;  Surgeon: Kristopher Brightly Kristopher Bruce, MD;  Location: [facility];  Service: General;  Laterality: N/A;  . QUADRICEPS TENDON REPAIR Right 08/07/2020   Procedure: REPAIR QUADRICEP TENDON;  Surgeon: Kristopher Butters, MD;  [facility]: [facility];  Service: Orthopedics;  Laterality: Right;  NEED RAPID TEST;SAME DAY WORKUP; COMING FROM [place]  . ROTATOR CUFF REPAIR Left 2005   detached; left shoulder-reattached  . SHOULDER SURGERY Right    laBRAL  tendon torn  . SUPERFICIAL LEFT LEG VEIN STRIPPING WITH SMALL SUPERFICIAL CLOT   2002  . ULNAR NERVE TRANSPOSITION Right 05/10/2018   Procedure: RIGHT ELBOW ULNAR NERVE RELEASE;  Surgeon: Iran Planas, MD;  Location: [place];  Service: Orthopedics;  Laterality: Right;  . UMBILICAL HERNIA REPAIR  02/18/2018  . UMBILICAL HERNIA REPAIR N/A 02/18/2018   Procedure: LAPAROSCOPIC UMBILICAL HERNIA REPAIR ERAS PATHWAY;  Surgeon: Gay, Kristopher Bruce, MD;  Location: [place];  Service: General;  Laterality: N/A;    FAMHx:  Family History  Problem Relation Age of Onset  . Pancreatic cancer Mother   . Breast cancer Sister   . Epilepsy Brother   . Adrenal disorder Neg Hx     SOCHx:   reports that he has never smoked. He has never used smokeless tobacco. He reports that he does not drink alcohol and does not use drugs.  ALLERGIES:  Allergies  Allergen Reactions  . Bee Venom Swelling  . Linzess [Linaclotide] Other (See Comments)    Abdominal pain  . Molds & Smuts Other (See Comments)    Unknown  . Seasonal Ic [Cholestatin] Other (See Comments)    Environmental Allergies    ROS: Pertinent items noted in HPI and remainder of comprehensive ROS otherwise negative.  HOME MEDS: Current Outpatient Medications on File Prior to Visit  Medication Sig Dispense Refill  . ascorbic acid (VITAMIN C) 1000 MG tablet Take 1,000 mg by mouth daily.     Marland Kitchen atorvastatin (LIPITOR) 40 MG tablet Take 40 mg by mouth daily.    . Azelastine-Fluticasone 137-50 MCG/ACT SUSP Place 1 spray into the nose as needed. 23 g 5  . Calcium Carbonate-Vit D-Min (CALCIUM 1200 PO) Take 1 tablet by mouth daily.    . Celecoxib (CELEBREX PO)     . Cholecalciferol (VITAMIN D3) 250 MCG (10000 UT) TABS Take 2,000 Units by mouth once a week.     Marland Kitchen EPINEPHrine 0.3 mg/0.3 mL IJ SOAJ injection Inject 0.3 mg into the muscle once.     . fluticasone (FLONASE) 50 MCG/ACT nasal spray Place 2 sprays into both nostrils daily.    . Fluticasone-Umeclidin-Vilant (TRELEGY ELLIPTA) 100-62.5-25 MCG/INH AEPB INHALE 1 PUFF INTO THE LUNGS  EVERY DAY 60 each 12  . furosemide (LASIX) 20 MG tablet TAKE 1 TABLET(20 MG) BY MOUTH DAILY 90 tablet 0  . gabapentin (NEURONTIN) 300 MG capsule TAKE 2 CAPSULE BY MOUTH IN THE MORNING, TAKE 1 CAPSULE AT MIDDAY AND 2 CAPSULE BY MOUTH AT BEDTIME 150 capsule 5  . glucosamine-chondroitin 500-400 MG tablet Take 1 tablet by mouth 3 (three) times daily.    . Magnesium 400 MG TABS Take 400 mg by mouth daily.     . metoprolol succinate (TOPROL-XL) 25 MG 24 hr tablet TAKE 1 TABLET(25 MG) BY MOUTH DAILY 90 tablet 2  . montelukast (SINGULAIR) 10 MG tablet  Take 1 tablet (10 mg total) by mouth at bedtime. 30 tablet 2  . montelukast (SINGULAIR) 10 MG tablet Take 1 tablet (10 mg total) by mouth daily. 30 tablet 5  . Multiple Vitamin (MULTIVITAMIN) tablet Take 1 tablet by mouth 2 (two) times daily.     . nitroGLYCERIN (NITROSTAT) 0.4 MG SL tablet Place 1 tablet (0.4 mg total) under the tongue every 5 (five) minutes as needed for chest pain. 25 tablet 3  . Omega-3 Fatty Acids (FISH OIL) 1000 MG CAPS Take by mouth.    . Prasterone, DHEA, (DHEA 50 PO) Take 1 tablet by mouth daily.    . traMADol (ULTRAM) 50 MG tablet Take 1 tablet (50 mg total) by mouth 2 (two) times daily. 30 tablet 0  . vitamin B-12 (CYANOCOBALAMIN) 500 MCG tablet Take by mouth daily.     No current facility-administered medications on file prior to visit.    LABS/IMAGING: No results found. However, due to the size of the patient record, not all encounters were searched. Please check Results Review for a complete set of results. No results found.  LIPID PANEL:    Component Value Date/Time   CHOL 129 01/25/2020 0927   TRIG 59 01/25/2020 0927   HDL 44 01/25/2020 0927   CHOLHDL 2.9 01/25/2020 0927   LDLCALC 72 01/25/2020 0927     WEIGHTS: Wt Readings from Last 3 Encounters:  01/21/21 257 lb 9.6 oz (116.8 kg)  01/17/21 260 lb (117.9 kg)  08/07/20 254 lb (115.2 kg)    VITALS: BP 124/70 (BP Location: Left Arm, Patient Position:  Sitting)   Pulse 63   Ht 6\' 2"  (1.88 m)   Wt 257 lb 9.6 oz (116.8 kg)   SpO2 93%   BMI 33.07 kg/m   EXAM: General appearance: alert and no distress Neck: no carotid bruit, no JVD and thyroid not enlarged, symmetric, no tenderness/mass/nodules Lungs: clear to auscultation bilaterally Heart: regular rate and rhythm, S1, S2 normal, no murmur, click, rub or gallop Abdomen: soft, non-tender; bowel sounds normal; no masses,  no organomegaly and Obese Extremities: Right forearm is in a brace Pulses: 2+ and symmetric Skin: Skin color, texture, turgor normal. No rashes or lesions Neurologic: Grossly normal Psych: Pleasant  EKG: Normal sinus rhythm at 63-personally reviewed  ASSESSMENT: 1. PAF-low CHADSVASC score of 1 2. Minimal nonobstructive CAD with CAC score of 8, 22nd percent (2020) 3. Low risk for upcoming surgery  PLAN: 1.   Mr. Nila adaptable a low risk for planned knee surgery.  No evidence of recurrent atrial fibrillation.  He had very minimal coronary disease which is reassuring in 2020 by CT.  Plan follow-up annually or sooner as necessary.  [name], MD, [facility], [place] Director of the Advanced Lipid Disorders &  Cardiovascular Risk Reduction Clinic Diplomate of the American Board of Clinical Lipidology Attending Cardiologist  Direct Dial: [phone]  Fax: [phone]  Website:  [email or web address] 01/21/2021, 3:54 PM

## 2021-01-21 NOTE — Patient Instructions (Signed)
Medication Instructions:  Your physician recommends that you continue on your current medications as directed. Please refer to the Current Medication list given to you today.  *If you need a refill on your cardiac medications before your next appointment, please call your pharmacy*   Follow-Up: At [facility], you and your health needs are our priority.  As part of our continuing mission to provide you with exceptional heart care, we have created designated Provider Care Teams.  These Care Teams include your primary Cardiologist (physician) and Advanced Practice Providers (APPs -  Physician Assistants and Nurse Practitioners) who all work together to provide you with the care you need, when you need it.  We recommend signing up for the patient portal called "MyChart".  Sign up information is provided on this After Visit Summary.  MyChart is used to connect with patients for Virtual Visits (Telemedicine).  Patients are able to view lab/test results, encounter notes, upcoming appointments, etc.  Non-urgent messages can be sent to your provider as well.   To learn more about what you can do with MyChart, go to [email or web address].    Your next appointment:   12 month(s)  The format for your next appointment:   In Person  Provider:   You will see one of the following Advanced Practice Providers on your designated Care Team:    Almyra Deforest, PA-C  Fabian Sharp, PA-C or   Roby Lofts, [place]     Other Instructions

## 2021-01-23 NOTE — Telephone Encounter (Signed)
Follow up:    Patient calling to get the status of his medical clearance. Please fax paper work to the [employer]

## 2021-01-24 ENCOUNTER — Telehealth: Payer: Self-pay | Admitting: Registered Nurse

## 2021-01-24 NOTE — Telephone Encounter (Signed)
This provider return Kristopher Gay call, , we discussed his various hospital stay and him being in a nursing home for three months. He will be scheduled for appointment on 01/25/2021 at 1:00 , he verbalizes understanding.

## 2021-01-24 NOTE — Telephone Encounter (Signed)
   Primary Cardiologist: [name], MD  Chart reviewed as part of pre-operative protocol coverage. Given past medical history and time since last visit, based on ACC/AHA guidelines, [place] would be at acceptable risk for the planned procedure without further cardiovascular testing.   [name] ia low risk for planned  surgery.  No evidence of recurrent atrial fibrillation.  He had very minimal coronary disease which is reassuring in 2020 by CT.  I will route this recommendation to the requesting party via Epic fax function and remove from pre-op pool.  Please call with questions.  Jossie Ng. Arnold Kester NP-C    01/24/2021, 7:36 AM [address] Office [phone] Fax [phone]

## 2021-01-25 ENCOUNTER — Encounter: Payer: Self-pay | Admitting: Registered Nurse

## 2021-01-25 ENCOUNTER — Encounter: Payer: Medicare PPO | Attending: Registered Nurse | Admitting: Registered Nurse

## 2021-01-25 ENCOUNTER — Other Ambulatory Visit: Payer: Self-pay

## 2021-01-25 VITALS — BP 154/90 | HR 68 | Temp 97.9°F | Ht 74.0 in | Wt 260.0 lb

## 2021-01-25 DIAGNOSIS — Z5181 Encounter for therapeutic drug level monitoring: Secondary | ICD-10-CM | POA: Diagnosis present

## 2021-01-25 DIAGNOSIS — M25561 Pain in right knee: Secondary | ICD-10-CM | POA: Diagnosis present

## 2021-01-25 DIAGNOSIS — G5621 Lesion of ulnar nerve, right upper limb: Secondary | ICD-10-CM | POA: Insufficient documentation

## 2021-01-25 DIAGNOSIS — M79641 Pain in right hand: Secondary | ICD-10-CM | POA: Diagnosis not present

## 2021-01-25 DIAGNOSIS — Z79891 Long term (current) use of opiate analgesic: Secondary | ICD-10-CM | POA: Diagnosis present

## 2021-01-25 DIAGNOSIS — S8410XS Injury of peroneal nerve at lower leg level, unspecified leg, sequela: Secondary | ICD-10-CM | POA: Diagnosis not present

## 2021-01-25 DIAGNOSIS — M542 Cervicalgia: Secondary | ICD-10-CM | POA: Diagnosis not present

## 2021-01-25 DIAGNOSIS — M25511 Pain in right shoulder: Secondary | ICD-10-CM | POA: Diagnosis not present

## 2021-01-25 DIAGNOSIS — M47817 Spondylosis without myelopathy or radiculopathy, lumbosacral region: Secondary | ICD-10-CM | POA: Diagnosis not present

## 2021-01-25 DIAGNOSIS — G894 Chronic pain syndrome: Secondary | ICD-10-CM | POA: Diagnosis not present

## 2021-01-25 DIAGNOSIS — M25521 Pain in right elbow: Secondary | ICD-10-CM | POA: Insufficient documentation

## 2021-01-25 DIAGNOSIS — G8929 Other chronic pain: Secondary | ICD-10-CM | POA: Insufficient documentation

## 2021-01-25 DIAGNOSIS — M546 Pain in thoracic spine: Secondary | ICD-10-CM | POA: Diagnosis not present

## 2021-01-25 MED ORDER — GABAPENTIN 300 MG PO CAPS: ORAL_CAPSULE | ORAL | 5 refills | Status: DC
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
## 2021-01-28 ENCOUNTER — Encounter: Payer: Self-pay | Admitting: Family Medicine

## 2021-01-28 ENCOUNTER — Ambulatory Visit (INDEPENDENT_AMBULATORY_CARE_PROVIDER_SITE_OTHER): Payer: Medicare PPO | Admitting: Family Medicine

## 2021-01-28 ENCOUNTER — Other Ambulatory Visit: Payer: Self-pay

## 2021-01-28 VITALS — BP 122/84 | HR 66 | Temp 99.1°F | Ht 72.0 in | Wt 255.4 lb

## 2021-01-28 DIAGNOSIS — J3089 Other allergic rhinitis: Secondary | ICD-10-CM | POA: Diagnosis not present

## 2021-01-28 DIAGNOSIS — R1314 Dysphagia, pharyngoesophageal phase: Secondary | ICD-10-CM

## 2021-01-28 DIAGNOSIS — Z0000 Encounter for general adult medical examination without abnormal findings: Secondary | ICD-10-CM | POA: Diagnosis not present

## 2021-01-28 DIAGNOSIS — J302 Other seasonal allergic rhinitis: Secondary | ICD-10-CM | POA: Diagnosis not present

## 2021-01-28 DIAGNOSIS — K219 Gastro-esophageal reflux disease without esophagitis: Secondary | ICD-10-CM | POA: Diagnosis not present

## 2021-01-28 DIAGNOSIS — I480 Paroxysmal atrial fibrillation: Secondary | ICD-10-CM

## 2021-01-28 DIAGNOSIS — S76111S Strain of right quadriceps muscle, fascia and tendon, sequela: Secondary | ICD-10-CM

## 2021-01-28 DIAGNOSIS — G894 Chronic pain syndrome: Secondary | ICD-10-CM

## 2021-01-28 DIAGNOSIS — J449 Chronic obstructive pulmonary disease, unspecified: Secondary | ICD-10-CM

## 2021-01-28 NOTE — Patient Instructions (Signed)
  Kristopher Gay , Thank you for taking time to come for your Medicare Wellness Visit. I appreciate your ongoing commitment to your health goals. Please review the following plan we discussed and let me know if I can assist you in the future.   These are the goals we discussed: Goals   None     This is a list of the screening recommended for you and due dates:  Health Maintenance  Topic Date Due  . COVID-19 Vaccine (3 - Booster for Pfizer series) 07/25/2020  . Flu Shot  05/27/2021  . Colon Cancer Screening  06/17/2027  . Tetanus Vaccine  09/19/2029  .  Hepatitis C: One time screening is recommended by Center for Disease Control  (CDC) for  adults born from [birth year] through 1965.   Completed  . Pneumonia vaccines  Completed  . HPV Vaccine  Aged Out

## 2021-01-28 NOTE — Progress Notes (Signed)
Kristopher Gay is a 70 y.o. male who presents for annual wellness visit and follow-up on chronic medical conditions.  He has the following concerns:   Immunizations and Health Maintenance Immunization History  Administered Date(s) Administered  . Fluad Quad(high Dose 65+) 08/08/2020  . H1N1 10/06/2008  . Influenza Split 08/08/2011, 09/24/2012, 08/27/2016  . Influenza Whole 07/30/2009, 08/16/2010  . Influenza, High Dose Seasonal PF 09/05/2016, 09/03/2018  . Influenza,inj,Quad PF,6+ Mos 08/05/2013, 07/31/2014, 07/27/2015, 08/18/2017, 07/12/2019  . PFIZER(Purple Top)SARS-COV-2 Vaccination 12/25/2019, 01/23/2020  . Pneumococcal Conjugate-13 09/19/2019  . Pneumococcal Polysaccharide-23 11/20/2020  . Tdap 09/20/2019  . Zoster Recombinat (Shingrix) 09/20/2019   Health Maintenance Due  Topic Date Due  . COVID-19 Vaccine (3 - Booster for Pfizer series) 07/25/2020    Last colonoscopy: 06/16/17 Last PSA:  N/A Dentist: over two years Ophtho: Q Year Exercise: N/A  Other doctors caring for patient include: Dr. Debara Pickett cardio, Dr. Percell Miller ortho  Advanced Directives: Does Patient Have a Medical Advance Directive?: Yes Type of Advance Directive: Living will Does patient want to make changes to medical advance directive?: No - Patient declined  Depression screen:  See questionnaire below.     Depression screen [facility] 2/9 01/25/2021 03/16/2020 11/15/2019  Decreased Interest 1 2 0  Down, Depressed, Hopeless 1 2 0  PHQ - 2 Score 2 4 0  Some recent data might be hidden    Fall Screen: See Questionaire below.   Fall Risk  01/28/2021 01/25/2021 03/16/2020 11/15/2019 09/16/2019  Falls in the past year? 1 0 0 1 0  Comment - - - - -  Number falls in past yr: 0 0 - 1 -  Injury with Fall? 1 0 - 0 -  Comment - - - - -  Risk Factor Category  - - - - -  Risk for fall due to : Impaired balance/gait;Impaired mobility;Orthopedic patient - Impaired balance/gait Impaired balance/gait -  Follow up Education  provided;Falls prevention discussed;Falls evaluation completed - - - -    ADL screen:  See questionnaire below.  Functional Status Survey: Is the patient deaf or have difficulty hearing?: No Does the patient have difficulty seeing, even when wearing glasses/contacts?: No Does the patient have difficulty concentrating, remembering, or making decisions?: No Does the patient have difficulty walking or climbing stairs?: Yes Does the patient have difficulty dressing or bathing?: Yes Does the patient have difficulty doing errands alone such as visiting a doctor's office or shopping?: Yes   Review of Systems  Constitutional: -, -unexpected weight change, -anorexia, -fatigue Allergy: -sneezing, -itching, -congestion Dermatology: denies changing moles, rash, lumps ENT: -runny nose, -ear pain, -sore throat,  Cardiology:  -chest pain, -palpitations, -orthopnea, Respiratory: -cough, -shortness of breath, -dyspnea on exertion, -wheezing,  Gastroenterology: -abdominal pain, -nausea, -vomiting, -diarrhea, -constipation, -dysphagia Hematology: -bleeding or bruising problems Musculoskeletal: -arthralgias, -myalgias, -joint swelling, -back pain, - Ophthalmology: -vision changes,  Urology: -dysuria, -difficulty urinating,  -urinary frequency, -urgency, incontinence Neurology: -, -numbness, , -memory loss, -falls, -dizziness    PHYSICAL EXAM:  There were no vitals taken for this visit.  General Appearance: Alert, cooperative, no distress, appears stated age Head: Normocephalic, without obvious abnormality, atraumatic Eyes: PERRL, conjunctiva/corneas clear, EOM's intact, fundi benign Ears: Normal TM's and external ear canals Nose: Nares normal, mucosa normal, no drainage or sinus   tenderness Throat: Lips, mucosa, and tongue normal; teeth and gums normal Neck: Supple, no lymphadenopathy, thyroid:no enlargement/tenderness/nodules; no carotid bruit or JVD Lungs: Clear to auscultation bilaterally  without wheezes, rales or ronchi; respirations  unlabored Heart: Regular rate and rhythm, S1 and S2 normal, no murmur, rub or gallop Abdomen: Soft, non-tender, nondistended, normoactive bowel sounds, no masses, no hepatosplenomegaly Extremities: No clubbing, cyanosis or edema Pulses: 2+ and symmetric all extremities Skin: Skin color, texture, turgor normal, no rashes or lesions Lymph nodes: Cervical, supraclavicular, and axillary nodes normal Neurologic: CNII-XII intact, normal strength, sensation and gait; reflexes 2+ and symmetric throughout   Psych: Normal mood, affect, hygiene and grooming  ASSESSMENT/PLAN:    Discussed PSA screening (risks/benefits), recommended at least 30 minutes of aerobic activity at least 5 days/week; proper sunscreen use reviewed; healthy diet and alcohol recommendations (less than or equal to 2 drinks/day) reviewed; regular seatbelt use; changing batteries in smoke detectors. Immunization recommendations discussed.  Colonoscopy recommendations reviewed.   Medicare Attestation I have personally reviewed: The patient's medical and social history Their use of alcohol, tobacco or illicit drugs Their current medications and supplements The patient's functional ability including ADLs,fall risks, home safety risks, cognitive, and hearing and visual impairment Diet and physical activities Evidence for depression or mood disorders  The patient's weight, height, and BMI have been recorded in the chart.  I have made referrals, counseling, and provided education to the patient based on review of the above and I have provided the patient with a written personalized care plan for preventive services.     Jill Alexanders, MD   01/28/2021

## 2021-01-28 NOTE — Progress Notes (Addendum)
Kristopher Gay is a 70 y.o. male who presents for annual wellness visit,CPE and follow-up on chronic medical conditions.  His main concern today is the fact that he is uncomfortable going home after surgery.  He apparently is going to have a revision of a ruptured quadriceps tendon.  He also has a history of chronic pain and is seeing pain pain management for this.  He has pain from various sources over the last years and at this point seem to be under some control with the use of tramadol.  He expresses some concerns over continuing that because of possible issues with constipation.  He then mentioned evidence of swelling and concerns about what kind of casting and bracing might be used. He does have underlying allergies and is on Dymista and Singulair with fairly good results with that.  He is also taking Trelegy for his underlying COPD.  He is having no difficulty with reflux symptoms.  He was recently seen by cardiology and evaluated for PAF.  He was cleared of any issues from that.  He continues on Lipitor.  He continues on Lasix mainly to help with the edema.  Also taking Neurontin for pain management.  Continues on metoprolol.   Immunizations and Health Maintenance Immunization History  Administered Date(s) Administered  . Fluad Quad(high Dose 65+) 08/08/2020  . H1N1 10/06/2008  . Influenza Split 08/08/2011, 09/24/2012, 08/27/2016  . Influenza Whole 07/30/2009, 08/16/2010  . Influenza, High Dose Seasonal PF 09/05/2016, 09/03/2018  . Influenza,inj,Quad PF,6+ Mos 08/05/2013, 07/31/2014, 07/27/2015, 08/18/2017, 07/12/2019  . PFIZER(Purple Top)SARS-COV-2 Vaccination 12/25/2019, 01/23/2020  . Pneumococcal Conjugate-13 09/19/2019  . Pneumococcal Polysaccharide-23 11/20/2020  . Tdap 09/20/2019  . Zoster Recombinat (Shingrix) 09/20/2019   Health Maintenance Due  Topic Date Due  . COVID-19 Vaccine (3 - Booster for Pfizer series) 07/25/2020    Last colonoscopy:2018 Last  PSA: Dentist:? Ophtho:? Exercise:none  Other doctors caring for patient include:[name]. [place]  Advanced Directives: Does Patient Have a Medical Advance Directive?: Yes Type of Advance Directive: Living will Does patient want to make changes to medical advance directive?: No - Patient declined  Depression screen:  See questionnaire below.     Depression screen [facility] 2/9 01/25/2021 03/16/2020 11/15/2019  Decreased Interest 1 2 0  Down, Depressed, Hopeless 1 2 0  PHQ - 2 Score 2 4 0  Some recent data might be hidden    Fall Screen: See Questionaire below.   Fall Risk  01/28/2021 01/25/2021 03/16/2020 11/15/2019 09/16/2019  Falls in the past year? 1 0 0 1 0  Comment - - - - -  Number falls in past yr: 0 0 - 1 -  Injury with Fall? 1 0 - 0 -  Comment - - - - -  Risk Factor Category  - - - - -  Risk for fall due to : Impaired balance/gait;Impaired mobility;Orthopedic patient - Impaired balance/gait Impaired balance/gait -  Follow up Education provided;Falls prevention discussed;Falls evaluation completed - - - -    ADL screen:  See questionnaire below.  Functional Status Survey: Is the patient deaf or have difficulty hearing?: No Does the patient have difficulty seeing, even when wearing glasses/contacts?: No Does the patient have difficulty concentrating, remembering, or making decisions?: No Does the patient have difficulty walking or climbing stairs?: Yes Does the patient have difficulty dressing or bathing?: Yes Does the patient have difficulty doing errands alone such as visiting a doctor's office or shopping?: Yes   Review of Systems  Constitutional: -, -unexpected  weight change, -anorexia, -fatigue Allergy: -sneezing, -itching, -congestion Dermatology: denies changing moles, rash, lumps ENT: -runny nose, -ear pain, -sore throat,  Cardiology:  -chest pain, -palpitations, -orthopnea, Respiratory: -cough, -shortness of breath, -dyspnea on exertion, -wheezing,   Gastroenterology: -abdominal pain, -nausea, -vomiting, -diarrhea, -constipation, -dysphagia Hematology: -bleeding or bruising problems Musculoskeletal: -arthralgias, -myalgias, -joint swelling, -back pain, - Ophthalmology: -vision changes,  Urology: -dysuria, -difficulty urinating,  -urinary frequency, -urgency, incontinence Neurology: -, -numbness, , -memory loss, -falls, -dizziness    PHYSICAL EXAM:  There were no vitals taken for this visit.  General Appearance: Alert, cooperative, no distress, appears stated age Head: Normocephalic, without obvious abnormality, atraumatic Eyes: PERRL, conjunctiva/corneas clear, EOM's intact,  Ears: Normal TM's and external ear canals Nose: Nares normal, mucosa normal, no drainage or sinus   tenderness Throat: Lips, mucosa, and tongue normal; teeth and gums normal Neck: Supple, no lymphadenopathy, thyroid:no enlargement/tenderness/nodules; no carotid bruit or JVD Lungs: Clear to auscultation bilaterally without wheezes, rales or ronchi; respirations unlabored Heart: Regular rate and rhythm, S1 and S2 normal, no murmur, rub or gallop Abdomen: Soft, non-tender, nondistended, normoactive bowel sounds, no masses, no hepatosplenomegaly Extremities: No clubbing, cyanosis or edema Pulses: 2+ and symmetric all extremities Skin: Skin color, texture, turgor normal, no rashes or lesions Lymph nodes: Cervical, supraclavicular, and axillary nodes normal Neurologic: CNII-XII intact, normal strength, sensation and gait; reflexes 2+ and symmetric throughout   Psych: Normal mood, affect, hygiene and grooming  ASSESSMENT/PLAN: Chronic pain syndrome  Seasonal and perennial allergic rhinitis  Dysphagia, pharyngoesophageal phase  PAF (paroxysmal atrial fibrillation) (HCC)  Gastroesophageal reflux disease, unspecified whether esophagitis present  Chronic obstructive pulmonary disease, unspecified COPD type (HCC)  Rupture of right quadriceps tendon,  sequela  He is to continue on his present medication regimen.  Continue to be followed by pain management. He is a very difficult patient to take care of as he tends to give much more information that is really not useful and is conflicting in nature.  He has multiple concerns over the care that he is getting and is perseverating over not being able to take care of himself at home. I explained that he needs to let the surgeon know that he is not capable of taking care of himself at home and needs to be in a nursing home until his rehab is far enough along that he can then take care of himself at home.  He then discussed issues of being in a cast and again I explained to him that he does not have to accept having a cast on.  Explained that if they want to send him home and he is uncomfortable with that he must let them know and not leave the hospital until better arrangements can be made   Discussed PSA screening (risks/benefits), recommended at least 30 minutes of aerobic activity at least 5 days/week; proper sunscreen use reviewed; healthy diet and alcohol recommendations (less than or equal to 2 drinks/day) reviewed; regular seatbelt use; changing batteries in smoke detectors. Immunization recommendations discussed.  Colonoscopy recommendations reviewed.   Medicare Attestation I have personally reviewed: The patient's medical and social history Their use of alcohol, tobacco or illicit drugs Their current medications and supplements The patient's functional ability including ADLs,fall risks, home safety risks, cognitive, and hearing and visual impairment Diet and physical activities Evidence for depression or mood disorders  The patient's weight, height, and BMI have been recorded in the chart.  I have made referrals, counseling, and provided education to the patient based  on review of the above and I have provided the patient with a written personalized care plan for preventive services.      Jill Alexanders, MD   01/28/2021

## 2021-01-29 LAB — LIPID PANEL
Chol/HDL Ratio: 3.5 ratio (ref 0.0–5.0)
Cholesterol, Total: 133 mg/dL (ref 100–199)
HDL: 38 mg/dL — ABNORMAL LOW (ref >39)
LDL Chol Calc (NIH): 80 mg/dL (ref 0–99)
Triglycerides: 74 mg/dL (ref 0–149)
VLDL Cholesterol Cal: 15 mg/dL (ref 5–40)

## 2021-01-30 ENCOUNTER — Telehealth: Payer: Self-pay

## 2021-01-30 NOTE — Telephone Encounter (Signed)
Received voicemail mr Kristopher Gay needed to ask about ROI and visit with primary care visit - phoned home phone rings forever no voicemail

## 2021-02-05 NOTE — Progress Notes (Addendum)
PCP - Jill Alexanders, MD Cardiologist - clearance Lyman Bishop, MD 01-21-21 epic  PPM/ICD -  Device Orders -  Rep Notified -   Chest x-ray -  EKG - 01-21-21  Stress Test -  ECHO - 2019 Cardiac Cath -   Sleep Study -  CPAP -   Fasting Blood Sugar -  Checks Blood Sugar _____ times a day  Blood Thinner Instructions: Aspirin Instructions:  ERAS Protcol - PRE-SURGERY Ensure or G2-   COVID TEST- 4-15  Activity--Able to do housework without SOB Anesthesia review: COPD,HTN no meds ,   Patient denies shortness of breath, fever, cough and chest pain at PAT appointment   All instructions explained to the patient, with a verbal understanding of the material. Patient agrees to go over the instructions while at home for a better understanding. Patient also instructed to self quarantine after being tested for COVID-19. The opportunity to ask questions was provided.

## 2021-02-05 NOTE — Patient Instructions (Addendum)
DUE TO COVID-19 ONLY ONE VISITOR IS ALLOWED TO COME WITH YOU AND STAY IN THE WAITING ROOM ONLY DURING PRE OP AND PROCEDURE DAY OF SURGERY.   TWO VISITOR  MAY VISIT WITH YOU AFTER SURGERY IN YOUR PRIVATE ROOM DURING VISITING HOURS ONLY!  YOU NEED TO HAVE A COVID 19 TEST ON__4-15_____ @_______ , THIS TEST MUST BE DONE BEFORE SURGERY,  COVID TESTING SITE [address] [place]  [id number], IT IS ON THE RIGHT GOING OUT [address] APPROXIMATELY  2 MINUTES PAST [employer] ON THE RIGHT. ONCE YOUR COVID TEST IS COMPLETED,  PLEASE BEGIN THE QUARANTINE INSTRUCTIONS AS OUTLINED IN YOUR HANDOUT.                EWELL BENASSI  02/05/2021   Your procedure is scheduled on: 02-12-21   Report to [facility] Main  Entrance   Report to admitting at       1200  PM     Call this number if you have problems the morning of surgery [phone]    Remember: NO SOLID FOOD AFTER MIDNIGHT THE NIGHT PRIOR TO SURGERY. NOTHING BY MOUTH EXCEPT CLEAR LIQUIDS UNTIL    1100 am.  PLEASE FINISH ENSURE DRINK PER SURGEON ORDER  WHICH NEEDS TO BE COMPLETED AT       1100 am  Then nothing by mouth .    CLEAR LIQUID DIET   Foods Allowed                                                                       Black Coffee and tea, regular and decaf                              Plain Jell-O any favor except red or purple                                           Fruit ices (not with fruit pulp)                                      Iced Popsicles                                   Carbonated beverages, regular and diet                                    Cranberry, grape and apple juices Sports drinks like Gatorade Lightly seasoned clear broth or consume(fat free) Sugar, honey syrup   _____________________________________________________________________    BRUSH YOUR TEETH MORNING OF SURGERY AND RINSE YOUR MOUTH OUT, NO CHEWING GUM CANDY OR MINTS.     Take these medicines the morning of  surgery with A SIP OF WATER: metoprolol, lipitor, singulair, gabapentin, inhalers Bring with you  You may not have any metal on your body including hair pins and              piercings  Do not wear jewelry,, lotions, powders or perfumes, deodorant              Men may shave face and neck.   Do not bring valuables to the hospital. [place].  Contacts, dentures or bridgework may not be worn into surgery.      Patients discharged the day of surgery will not be allowed to drive home. IF YOU ARE HAVING SURGERY AND GOING HOME THE SAME DAY, YOU MUST HAVE AN ADULT TO DRIVE YOU HOME AND BE WITH YOU FOR 24 HOURS. YOU MAY GO HOME BY TAXI OR UBER OR ORTHERWISE, BUT AN ADULT MUST ACCOMPANY YOU HOME AND STAY WITH YOU FOR 24 HOURS.  Name and phone number of your driver:  Special Instructions: N/A              Please read over the following fact sheets you were given: _____________________________________________________________________             [facility] - Preparing for Surgery Before surgery, you can play an important role.  Because skin is not sterile, your skin needs to be as free of germs as possible.  You can reduce the number of germs on your skin by washing with CHG (chlorahexidine gluconate) soap before surgery.  CHG is an antiseptic cleaner which kills germs and bonds with the skin to continue killing germs even after washing. Please DO NOT use if you have an allergy to CHG or antibacterial soaps.  If your skin becomes reddened/irritated stop using the CHG and inform your nurse when you arrive at Short Stay. Do not shave (including legs and underarms) for at least 48 hours prior to the first CHG shower.  You may shave your face/neck. Please follow these instructions carefully:  1.  Shower with CHG Soap the night before surgery and the  morning of Surgery.  2.  If you choose to wash your hair, wash your hair  first as usual with your  normal  shampoo.  3.  After you shampoo, rinse your hair and body thoroughly to remove the  shampoo.                           4.  Use CHG as you would any other liquid soap.  You can apply chg directly  to the skin and wash                       Gently with a scrungie or clean washcloth.  5.  Apply the CHG Soap to your body ONLY FROM THE NECK DOWN.   Do not use on face/ open                           Wound or open sores. Avoid contact with eyes, ears mouth and genitals (private parts).                       Wash face,  Genitals (private parts) with your normal soap.             6.  Wash thoroughly, paying special attention to the  area where your surgery  will be performed.  7.  Thoroughly rinse your body with warm water from the neck down.  8.  DO NOT shower/wash with your normal soap after using and rinsing off  the CHG Soap.                9.  Pat yourself dry with a clean towel.            10.  Wear clean pajamas.            11.  Place clean sheets on your bed the night of your first shower and do not  sleep with pets. Day of Surgery : Do not apply any lotions/deodorants the morning of surgery.  Please wear clean clothes to the hospital/surgery center.  FAILURE TO FOLLOW THESE INSTRUCTIONS MAY RESULT IN THE CANCELLATION OF YOUR SURGERY PATIENT SIGNATURE_________________________________  NURSE SIGNATURE__________________________________  ________________________________________________________________________   Adam Phenix  An incentive spirometer is a tool that can help keep your lungs clear and active. This tool measures how well you are filling your lungs with each breath. Taking long deep breaths may help reverse or decrease the chance of developing breathing (pulmonary) problems (especially infection) following:  A long period of time when you are unable to move or be active. BEFORE THE PROCEDURE   If the spirometer includes an indicator to  show your best effort, your nurse or respiratory therapist will set it to a desired goal.  If possible, sit up straight or lean slightly forward. Try not to slouch.  Hold the incentive spirometer in an upright position. INSTRUCTIONS FOR USE  1. Sit on the edge of your bed if possible, or sit up as far as you can in bed or on a chair. 2. Hold the incentive spirometer in an upright position. 3. Breathe out normally. 4. Place the mouthpiece in your mouth and seal your lips tightly around it. 5. Breathe in slowly and as deeply as possible, raising the piston or the ball toward the top of the column. 6. Hold your breath for 3-5 seconds or for as long as possible. Allow the piston or ball to fall to the bottom of the column. 7. Remove the mouthpiece from your mouth and breathe out normally. 8. Rest for a few seconds and repeat Steps 1 through 7 at least 10 times every 1-2 hours when you are awake. Take your time and take a few normal breaths between deep breaths. 9. The spirometer may include an indicator to show your best effort. Use the indicator as a goal to work toward during each repetition. 10. After each set of 10 deep breaths, practice coughing to be sure your lungs are clear. If you have an incision (the cut made at the time of surgery), support your incision when coughing by placing a pillow or rolled up towels firmly against it. Once you are able to get out of bed, walk around indoors and cough well. You may stop using the incentive spirometer when instructed by your caregiver.  RISKS AND COMPLICATIONS  Take your time so you do not get dizzy or light-headed.  If you are in pain, you may need to take or ask for pain medication before doing incentive spirometry. It is harder to take a deep breath if you are having pain. AFTER USE  Rest and breathe slowly and easily.  It can be helpful to keep track of a log of your progress. Your caregiver can provide you with a simple  table to help with  this. If you are using the spirometer at home, follow these instructions: [place] IF:   You are having difficultly using the spirometer.  You have trouble using the spirometer as often as instructed.  Your pain medication is not giving enough relief while using the spirometer.  You develop fever of 100.5 F (38.1 C) or higher. SEEK IMMEDIATE MEDICAL CARE IF:   You cough up bloody sputum that had not been present before.  You develop fever of 102 F (38.9 C) or greater.  You develop worsening pain at or near the incision site. MAKE SURE YOU:   Understand these instructions.  Will watch your condition.  Will get help right away if you are not doing well or get worse. Document Released: 02/23/2007 Document Revised: 01/05/2012 Document Reviewed: 04/26/2007 [facility] Patient Information 2014 [place], [place].   ________________________________________________________________________

## 2021-02-06 ENCOUNTER — Encounter (HOSPITAL_COMMUNITY): Payer: Self-pay

## 2021-02-06 ENCOUNTER — Encounter (HOSPITAL_COMMUNITY)
Admission: RE | Admit: 2021-02-06 | Discharge: 2021-02-06 | Disposition: A | Discharge status: Home / Self Care | Payer: Medicare PPO | Source: Ambulatory Visit | Attending: Orthopedic Surgery | Admitting: Orthopedic Surgery

## 2021-02-06 ENCOUNTER — Other Ambulatory Visit: Payer: Self-pay

## 2021-02-06 DIAGNOSIS — Z01812 Encounter for preprocedural laboratory examination: Secondary | ICD-10-CM | POA: Insufficient documentation

## 2021-02-06 HISTORY — DX: Cardiac arrhythmia, unspecified: I49.9

## 2021-02-06 LAB — CBC
HCT: 45.4 % (ref 39.0–52.0)
Hemoglobin: 15.5 g/dL (ref 13.0–17.0)
MCH: 32.1 pg (ref 26.0–34.0)
MCHC: 34.1 g/dL (ref 30.0–36.0)
MCV: 94 fL (ref 80.0–100.0)
Platelets: 145 10*3/uL — ABNORMAL LOW (ref 150–400)
RBC: 4.83 MIL/uL (ref 4.22–5.81)
RDW: 13.2 % (ref 11.5–15.5)
WBC: 6.5 10*3/uL (ref 4.0–10.5)
nRBC: 0 % (ref 0.0–0.2)

## 2021-02-06 LAB — BASIC METABOLIC PANEL
Anion gap: 8 (ref 5–15)
BUN: 19 mg/dL (ref 8–23)
CO2: 25 mmol/L (ref 22–32)
Calcium: 9.3 mg/dL (ref 8.9–10.3)
Chloride: 108 mmol/L (ref 98–111)
Creatinine, Ser: 1.06 mg/dL (ref 0.61–1.24)
GFR, Estimated: >60 mL/min (ref >60)
Glucose, Bld: 107 mg/dL — ABNORMAL HIGH (ref 70–99)
Potassium: 4.5 mmol/L (ref 3.5–5.1)
Sodium: 141 mmol/L (ref 135–145)

## 2021-02-08 ENCOUNTER — Other Ambulatory Visit (HOSPITAL_COMMUNITY): Payer: Medicare PPO

## 2021-02-08 DIAGNOSIS — S76101A Unspecified injury of right quadriceps muscle, fascia and tendon, initial encounter: Secondary | ICD-10-CM | POA: Diagnosis not present

## 2021-02-20 NOTE — Progress Notes (Signed)
Mr. Helderman made aware to arrive at 10:40AM 02/26/2021, reviewed instruction, verbalized understanding. Asked if insurance had approved, I called Claiborne Billings at Dr. Debroah Loop office to make her aware that patient was asking if insurance ad authorized proceed, she is aware of the patients concern.

## 2021-02-22 ENCOUNTER — Other Ambulatory Visit (HOSPITAL_COMMUNITY)
Admission: RE | Admit: 2021-02-22 | Discharge: 2021-02-22 | Disposition: A | Discharge status: Home / Self Care | Payer: Medicare PPO | Source: Ambulatory Visit | Attending: Orthopedic Surgery | Admitting: Orthopedic Surgery

## 2021-02-22 DIAGNOSIS — Z20822 Contact with and (suspected) exposure to covid-19: Secondary | ICD-10-CM | POA: Insufficient documentation

## 2021-02-22 DIAGNOSIS — Z01812 Encounter for preprocedural laboratory examination: Secondary | ICD-10-CM | POA: Insufficient documentation

## 2021-02-23 LAB — SARS CORONAVIRUS 2 (TAT 6-24 HRS): SARS Coronavirus 2: NEGATIVE
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
## 2021-02-26 ENCOUNTER — Encounter (HOSPITAL_COMMUNITY): Payer: Self-pay | Admitting: Orthopedic Surgery

## 2021-02-26 ENCOUNTER — Encounter (HOSPITAL_COMMUNITY)
Admission: AD | Disposition: A | Discharge status: Skilled Nursing Facility | Payer: Self-pay | Source: Home / Self Care | Attending: Orthopedic Surgery

## 2021-02-26 ENCOUNTER — Ambulatory Visit (HOSPITAL_COMMUNITY): Payer: Medicare PPO | Admitting: Certified Registered"

## 2021-02-26 ENCOUNTER — Other Ambulatory Visit: Payer: Self-pay

## 2021-02-26 ENCOUNTER — Inpatient Hospital Stay (HOSPITAL_COMMUNITY)
Admission: AD | Admit: 2021-02-26 | Discharge: 2021-03-01 | DRG: 502 | Disposition: A | Discharge status: Skilled Nursing Facility | Payer: Medicare PPO | Attending: Orthopedic Surgery | Admitting: Orthopedic Surgery

## 2021-02-26 DIAGNOSIS — S76111A Strain of right quadriceps muscle, fascia and tendon, initial encounter: Principal | ICD-10-CM | POA: Diagnosis present

## 2021-02-26 DIAGNOSIS — J309 Allergic rhinitis, unspecified: Secondary | ICD-10-CM | POA: Diagnosis present

## 2021-02-26 DIAGNOSIS — Z79899 Other long term (current) drug therapy: Secondary | ICD-10-CM

## 2021-02-26 DIAGNOSIS — J439 Emphysema, unspecified: Secondary | ICD-10-CM | POA: Diagnosis present

## 2021-02-26 DIAGNOSIS — G629 Polyneuropathy, unspecified: Secondary | ICD-10-CM | POA: Diagnosis not present

## 2021-02-26 DIAGNOSIS — Y929 Unspecified place or not applicable: Secondary | ICD-10-CM | POA: Diagnosis not present

## 2021-02-26 DIAGNOSIS — R279 Unspecified lack of coordination: Secondary | ICD-10-CM | POA: Diagnosis not present

## 2021-02-26 DIAGNOSIS — S83411A Sprain of medial collateral ligament of right knee, initial encounter: Secondary | ICD-10-CM | POA: Diagnosis not present

## 2021-02-26 DIAGNOSIS — X58XXXA Exposure to other specified factors, initial encounter: Secondary | ICD-10-CM | POA: Diagnosis present

## 2021-02-26 DIAGNOSIS — Z7982 Long term (current) use of aspirin: Secondary | ICD-10-CM | POA: Diagnosis not present

## 2021-02-26 DIAGNOSIS — Z888 Allergy status to other drugs, medicaments and biological substances status: Secondary | ICD-10-CM

## 2021-02-26 DIAGNOSIS — F419 Anxiety disorder, unspecified: Secondary | ICD-10-CM | POA: Diagnosis present

## 2021-02-26 DIAGNOSIS — I739 Peripheral vascular disease, unspecified: Secondary | ICD-10-CM | POA: Diagnosis not present

## 2021-02-26 DIAGNOSIS — Z743 Need for continuous supervision: Secondary | ICD-10-CM | POA: Diagnosis not present

## 2021-02-26 DIAGNOSIS — S83421A Sprain of lateral collateral ligament of right knee, initial encounter: Secondary | ICD-10-CM | POA: Diagnosis not present

## 2021-02-26 DIAGNOSIS — G8918 Other acute postprocedural pain: Secondary | ICD-10-CM | POA: Diagnosis not present

## 2021-02-26 DIAGNOSIS — Z91014 Allergy to mammalian meats: Secondary | ICD-10-CM

## 2021-02-26 DIAGNOSIS — K219 Gastro-esophageal reflux disease without esophagitis: Secondary | ICD-10-CM | POA: Diagnosis present

## 2021-02-26 DIAGNOSIS — J449 Chronic obstructive pulmonary disease, unspecified: Secondary | ICD-10-CM | POA: Diagnosis not present

## 2021-02-26 DIAGNOSIS — Z820 Family history of epilepsy and other diseases of the nervous system: Secondary | ICD-10-CM | POA: Diagnosis not present

## 2021-02-26 DIAGNOSIS — S76111S Strain of right quadriceps muscle, fascia and tendon, sequela: Secondary | ICD-10-CM

## 2021-02-26 DIAGNOSIS — I10 Essential (primary) hypertension: Secondary | ICD-10-CM | POA: Diagnosis present

## 2021-02-26 DIAGNOSIS — M66251 Spontaneous rupture of extensor tendons, right thigh: Secondary | ICD-10-CM | POA: Diagnosis not present

## 2021-02-26 DIAGNOSIS — Z20822 Contact with and (suspected) exposure to covid-19: Secondary | ICD-10-CM | POA: Diagnosis present

## 2021-02-26 DIAGNOSIS — I4891 Unspecified atrial fibrillation: Secondary | ICD-10-CM | POA: Diagnosis not present

## 2021-02-26 DIAGNOSIS — S76111D Strain of right quadriceps muscle, fascia and tendon, subsequent encounter: Secondary | ICD-10-CM | POA: Diagnosis not present

## 2021-02-26 DIAGNOSIS — M1990 Unspecified osteoarthritis, unspecified site: Secondary | ICD-10-CM | POA: Diagnosis not present

## 2021-02-26 DIAGNOSIS — T84498A Other mechanical complication of other internal orthopedic devices, implants and grafts, initial encounter: Secondary | ICD-10-CM | POA: Diagnosis not present

## 2021-02-26 DIAGNOSIS — R5381 Other malaise: Secondary | ICD-10-CM | POA: Diagnosis not present

## 2021-02-26 HISTORY — PX: QUADRICEPS TENDON REPAIR: SHX756

## 2021-02-26 LAB — BASIC METABOLIC PANEL
Anion gap: 9 (ref 5–15)
BUN: 24 mg/dL — ABNORMAL HIGH (ref 8–23)
CO2: 22 mmol/L (ref 22–32)
Calcium: 9.4 mg/dL (ref 8.9–10.3)
Chloride: 109 mmol/L (ref 98–111)
Creatinine, Ser: 1.02 mg/dL (ref 0.61–1.24)
GFR, Estimated: >60 mL/min (ref >60)
Glucose, Bld: 141 mg/dL — ABNORMAL HIGH (ref 70–99)
Potassium: 3.6 mmol/L (ref 3.5–5.1)
Sodium: 140 mmol/L (ref 135–145)

## 2021-02-26 LAB — CBC
HCT: 42.6 % (ref 39.0–52.0)
Hemoglobin: 15.1 g/dL (ref 13.0–17.0)
MCH: 32.7 pg (ref 26.0–34.0)
MCHC: 35.4 g/dL (ref 30.0–36.0)
MCV: 92.2 fL (ref 80.0–100.0)
Platelets: 158 10*3/uL (ref 150–400)
RBC: 4.62 MIL/uL (ref 4.22–5.81)
RDW: 13.2 % (ref 11.5–15.5)
WBC: 7.7 10*3/uL (ref 4.0–10.5)
nRBC: 0 % (ref 0.0–0.2)

## 2021-02-26 SURGERY — REPAIR, TENDON, QUADRICEPS
Anesthesia: Regional | Laterality: Right

## 2021-02-26 MED ORDER — DEXAMETHASONE SODIUM PHOSPHATE 10 MG/ML IJ SOLN
INTRAMUSCULAR | Status: DC | PRN
  Administered 2021-02-26: 5 mg

## 2021-02-26 MED ORDER — DOCUSATE SODIUM 100 MG PO CAPS
100.0000 mg | ORAL_CAPSULE | Freq: Two times a day (BID) | ORAL | Status: DC
  Administered 2021-02-26 – 2021-03-01 (×6): 100 mg via ORAL
  Filled 2021-02-26 (×6): qty 1

## 2021-02-26 MED ORDER — PROPOFOL 10 MG/ML IV BOLUS
INTRAVENOUS | Status: AC
  Filled 2021-02-26: qty 20

## 2021-02-26 MED ORDER — GLYCOPYRROLATE PF 0.2 MG/ML IJ SOSY
PREFILLED_SYRINGE | INTRAMUSCULAR | Status: DC | PRN
  Administered 2021-02-26 (×2): .1 mg via INTRAVENOUS

## 2021-02-26 MED ORDER — METOPROLOL SUCCINATE ER 25 MG PO TB24
25.0000 mg | ORAL_TABLET | Freq: Once | ORAL | Status: AC
  Administered 2021-02-26: 25 mg via ORAL
  Filled 2021-02-26: qty 1

## 2021-02-26 MED ORDER — FENTANYL CITRATE (PF) 100 MCG/2ML IJ SOLN: 25.0000 ug | INTRAMUSCULAR | Status: DC | PRN

## 2021-02-26 MED ORDER — HYDROMORPHONE HCL 1 MG/ML IJ SOLN: 0.5000 mg | INTRAMUSCULAR | Status: DC | PRN

## 2021-02-26 MED ORDER — DEXMEDETOMIDINE (PRECEDEX) IN NS 20 MCG/5ML (4 MCG/ML) IV SYRINGE
PREFILLED_SYRINGE | INTRAVENOUS | Status: DC | PRN
  Administered 2021-02-26: 8 ug via INTRAVENOUS

## 2021-02-26 MED ORDER — FUROSEMIDE 20 MG PO TABS
20.0000 mg | ORAL_TABLET | Freq: Every day | ORAL | Status: DC
  Administered 2021-02-27 – 2021-03-01 (×3): 20 mg via ORAL
  Filled 2021-02-26 (×3): qty 1

## 2021-02-26 MED ORDER — METOPROLOL SUCCINATE ER 25 MG PO TB24
25.0000 mg | ORAL_TABLET | Freq: Every day | ORAL | Status: DC
  Administered 2021-02-27 – 2021-03-01 (×3): 25 mg via ORAL
  Filled 2021-02-26 (×3): qty 1

## 2021-02-26 MED ORDER — CEFAZOLIN SODIUM-DEXTROSE 2-4 GM/100ML-% IV SOLN
2.0000 g | INTRAVENOUS | Status: AC
  Administered 2021-02-26: 2 g via INTRAVENOUS
  Filled 2021-02-26: qty 100

## 2021-02-26 MED ORDER — FENTANYL CITRATE (PF) 100 MCG/2ML IJ SOLN
INTRAMUSCULAR | Status: AC
  Administered 2021-02-26: 100 ug
  Filled 2021-02-26: qty 2

## 2021-02-26 MED ORDER — TRAMADOL HCL 50 MG PO TABS
50.0000 mg | ORAL_TABLET | Freq: Four times a day (QID) | ORAL | Status: DC
  Administered 2021-02-27 – 2021-03-01 (×7): 50 mg via ORAL
  Filled 2021-02-26 (×9): qty 1

## 2021-02-26 MED ORDER — BUPIVACAINE HCL (PF) 0.25 % IJ SOLN
INTRAMUSCULAR | Status: AC
  Filled 2021-02-26: qty 30

## 2021-02-26 MED ORDER — METOCLOPRAMIDE HCL 5 MG PO TABS: 5.0000 mg | ORAL_TABLET | Freq: Three times a day (TID) | ORAL | Status: DC | PRN

## 2021-02-26 MED ORDER — MAGNESIUM CITRATE PO SOLN: 1.0000 | Freq: Once | ORAL | Status: DC | PRN

## 2021-02-26 MED ORDER — SODIUM CHLORIDE 0.9 % IR SOLN
Status: DC | PRN
  Administered 2021-02-26: 1000 mL

## 2021-02-26 MED ORDER — MIDAZOLAM HCL 2 MG/2ML IJ SOLN
INTRAMUSCULAR | Status: AC
  Administered 2021-02-26: 2 mg
  Filled 2021-02-26: qty 2

## 2021-02-26 MED ORDER — MONTELUKAST SODIUM 10 MG PO TABS
10.0000 mg | ORAL_TABLET | Freq: Every day | ORAL | Status: DC
  Administered 2021-02-27 – 2021-02-28 (×2): 10 mg via ORAL
  Filled 2021-02-26 (×4): qty 1

## 2021-02-26 MED ORDER — METHOCARBAMOL 500 MG IVPB - SIMPLE MED
500.0000 mg | Freq: Four times a day (QID) | INTRAVENOUS | Status: DC | PRN
  Filled 2021-02-26: qty 50

## 2021-02-26 MED ORDER — CEFAZOLIN SODIUM-DEXTROSE 2-4 GM/100ML-% IV SOLN
2.0000 g | Freq: Four times a day (QID) | INTRAVENOUS | Status: AC
  Administered 2021-02-26 – 2021-02-27 (×3): 2 g via INTRAVENOUS
  Filled 2021-02-26 (×3): qty 100

## 2021-02-26 MED ORDER — METOCLOPRAMIDE HCL 5 MG/ML IJ SOLN
5.0000 mg | Freq: Three times a day (TID) | INTRAMUSCULAR | Status: DC | PRN
Start: 2021-02-26 — End: 2021-03-01

## 2021-02-26 MED ORDER — PROPOFOL 10 MG/ML IV BOLUS
INTRAVENOUS | Status: DC | PRN
  Administered 2021-02-26: 200 mg via INTRAVENOUS

## 2021-02-26 MED ORDER — DIPHENHYDRAMINE HCL 12.5 MG/5ML PO ELIX
12.5000 mg | ORAL_SOLUTION | ORAL | Status: DC | PRN
Start: 2021-02-26 — End: 2021-03-01

## 2021-02-26 MED ORDER — LIDOCAINE 2% (20 MG/ML) 5 ML SYRINGE
INTRAMUSCULAR | Status: AC
  Filled 2021-02-26: qty 5

## 2021-02-26 MED ORDER — ONDANSETRON HCL 4 MG/2ML IJ SOLN
INTRAMUSCULAR | Status: AC
  Filled 2021-02-26: qty 2

## 2021-02-26 MED ORDER — DEXAMETHASONE SODIUM PHOSPHATE 10 MG/ML IJ SOLN: 8.0000 mg | Freq: Once | INTRAMUSCULAR | Status: DC

## 2021-02-26 MED ORDER — ACETAMINOPHEN 500 MG PO TABS
1000.0000 mg | ORAL_TABLET | Freq: Four times a day (QID) | ORAL | Status: AC
  Administered 2021-02-26 – 2021-02-27 (×4): 1000 mg via ORAL
  Filled 2021-02-26 (×4): qty 2

## 2021-02-26 MED ORDER — MIDAZOLAM HCL 2 MG/2ML IJ SOLN
INTRAMUSCULAR | Status: AC
  Filled 2021-02-26: qty 2

## 2021-02-26 MED ORDER — DEXAMETHASONE SODIUM PHOSPHATE 10 MG/ML IJ SOLN
INTRAMUSCULAR | Status: AC
  Filled 2021-02-26: qty 1

## 2021-02-26 MED ORDER — ROPIVACAINE HCL 5 MG/ML IJ SOLN
INTRAMUSCULAR | Status: DC | PRN
  Administered 2021-02-26: 25 mL via PERINEURAL

## 2021-02-26 MED ORDER — OXYCODONE HCL 5 MG PO TABS: 5.0000 mg | ORAL_TABLET | ORAL | Status: DC | PRN

## 2021-02-26 MED ORDER — ONDANSETRON HCL 4 MG/2ML IJ SOLN
INTRAMUSCULAR | Status: DC | PRN
  Administered 2021-02-26: 4 mg via INTRAVENOUS

## 2021-02-26 MED ORDER — LACTATED RINGERS IV SOLN: INTRAVENOUS | Status: DC

## 2021-02-26 MED ORDER — ATORVASTATIN CALCIUM 40 MG PO TABS
40.0000 mg | ORAL_TABLET | Freq: Every day | ORAL | Status: DC
  Administered 2021-02-26 – 2021-02-28 (×3): 40 mg via ORAL
  Filled 2021-02-26 (×6): qty 1

## 2021-02-26 MED ORDER — ONDANSETRON HCL 4 MG/2ML IJ SOLN
4.0000 mg | Freq: Four times a day (QID) | INTRAMUSCULAR | Status: DC | PRN
  Administered 2021-02-26: 4 mg via INTRAVENOUS
  Filled 2021-02-26: qty 2

## 2021-02-26 MED ORDER — OXYCODONE HCL 5 MG PO TABS
10.0000 mg | ORAL_TABLET | ORAL | Status: DC | PRN
  Administered 2021-02-26: 10 mg via ORAL
  Filled 2021-02-26: qty 2
  Filled 2021-02-26: qty 3

## 2021-02-26 MED ORDER — ACETAMINOPHEN 500 MG PO TABS
1000.0000 mg | ORAL_TABLET | Freq: Once | ORAL | Status: AC
  Administered 2021-02-26: 1000 mg via ORAL
  Filled 2021-02-26: qty 2

## 2021-02-26 MED ORDER — ACETAMINOPHEN 325 MG PO TABS
325.0000 mg | ORAL_TABLET | Freq: Four times a day (QID) | ORAL | Status: DC | PRN
  Administered 2021-02-27 – 2021-03-01 (×6): 650 mg via ORAL
  Filled 2021-02-26 (×6): qty 2

## 2021-02-26 MED ORDER — PANTOPRAZOLE SODIUM 40 MG PO TBEC
40.0000 mg | DELAYED_RELEASE_TABLET | Freq: Every day | ORAL | Status: DC
  Administered 2021-02-28 – 2021-03-01 (×2): 40 mg via ORAL
  Filled 2021-02-26 (×4): qty 1

## 2021-02-26 MED ORDER — BUPIVACAINE HCL (PF) 0.5 % IJ SOLN
INTRAMUSCULAR | Status: AC
  Filled 2021-02-26: qty 30

## 2021-02-26 MED ORDER — LIDOCAINE 2% (20 MG/ML) 5 ML SYRINGE
INTRAMUSCULAR | Status: DC | PRN
  Administered 2021-02-26: 60 mg via INTRAVENOUS

## 2021-02-26 MED ORDER — ONDANSETRON HCL 4 MG PO TABS: 4.0000 mg | ORAL_TABLET | Freq: Four times a day (QID) | ORAL | Status: DC | PRN

## 2021-02-26 MED ORDER — METHOCARBAMOL 500 MG PO TABS
500.0000 mg | ORAL_TABLET | Freq: Four times a day (QID) | ORAL | Status: DC | PRN
  Administered 2021-02-26 – 2021-03-01 (×10): 500 mg via ORAL
  Filled 2021-02-26 (×10): qty 1

## 2021-02-26 MED ORDER — BISACODYL 10 MG RE SUPP: 10.0000 mg | Freq: Every day | RECTAL | Status: DC | PRN

## 2021-02-26 MED ORDER — ORAL CARE MOUTH RINSE
15.0000 mL | Freq: Once | OROMUCOSAL | Status: AC
  Administered 2021-02-26: 15 mL via OROMUCOSAL

## 2021-02-26 MED ORDER — DEXAMETHASONE SODIUM PHOSPHATE 10 MG/ML IJ SOLN
INTRAMUSCULAR | Status: DC | PRN
  Administered 2021-02-26: 5 mg via INTRAVENOUS

## 2021-02-26 MED ORDER — CHLORHEXIDINE GLUCONATE 0.12 % MT SOLN: 15.0000 mL | Freq: Once | OROMUCOSAL | Status: AC

## 2021-02-26 MED ORDER — FENTANYL CITRATE (PF) 250 MCG/5ML IJ SOLN
INTRAMUSCULAR | Status: DC | PRN
  Administered 2021-02-26 (×5): 25 ug via INTRAVENOUS

## 2021-02-26 MED ORDER — FENTANYL CITRATE (PF) 100 MCG/2ML IJ SOLN
INTRAMUSCULAR | Status: AC
  Filled 2021-02-26: qty 2

## 2021-02-26 MED ORDER — ENOXAPARIN SODIUM 40 MG/0.4ML IJ SOSY
40.0000 mg | PREFILLED_SYRINGE | INTRAMUSCULAR | Status: DC
  Administered 2021-02-27 – 2021-03-01 (×3): 40 mg via SUBCUTANEOUS
  Filled 2021-02-26 (×4): qty 0.4

## 2021-02-26 MED ORDER — POLYETHYLENE GLYCOL 3350 17 G PO PACK
17.0000 g | PACK | Freq: Every day | ORAL | Status: DC | PRN
  Administered 2021-02-26 – 2021-03-01 (×3): 17 g via ORAL
  Filled 2021-02-26 (×4): qty 1

## 2021-02-26 SURGICAL SUPPLY — 72 items
APL PRP STRL LF DISP 70% ISPRP (MISCELLANEOUS)
BANDAGE ESMARK 6X9 LF (GAUZE/BANDAGES/DRESSINGS) ×1 IMPLANT
BIT DRILL 2.0X128 (BIT) ×1 IMPLANT
BLADE SURG SZ10 CARB STEEL (BLADE) ×2 IMPLANT
BNDG CMPR 9X6 STRL LF SNTH (GAUZE/BANDAGES/DRESSINGS) ×1
BNDG COHESIVE 6X5 TAN STRL LF (GAUZE/BANDAGES/DRESSINGS) ×2 IMPLANT
BNDG ELASTIC 3X5.8 VLCR STR LF (GAUZE/BANDAGES/DRESSINGS) ×2 IMPLANT
BNDG ELASTIC 6X5.8 VLCR STR LF (GAUZE/BANDAGES/DRESSINGS) ×2 IMPLANT
BNDG ESMARK 6X9 LF (GAUZE/BANDAGES/DRESSINGS) ×2
CHLORAPREP W/TINT 26 (MISCELLANEOUS) ×1 IMPLANT
CLSR STERI-STRIP ANTIMIC 1/2X4 (GAUZE/BANDAGES/DRESSINGS) ×2 IMPLANT
COUNTER NEEDLE 20 DBL MAG RED (NEEDLE) ×1 IMPLANT
COVER MAYO STAND STRL (DRAPES) ×1 IMPLANT
COVER WAND RF STERILE (DRAPES) IMPLANT
CUFF TOURN SGL QUICK 34 (TOURNIQUET CUFF) ×2
CUFF TRNQT CYL 34X4.125X (TOURNIQUET CUFF) ×1 IMPLANT
DECANTER SPIKE VIAL GLASS SM (MISCELLANEOUS) IMPLANT
DRAPE BILATERAL LIMB T (DRAPES) IMPLANT
DRAPE U-SHAPE 47X51 STRL (DRAPES) ×2 IMPLANT
DRSG ADAPTIC 3X8 NADH LF (GAUZE/BANDAGES/DRESSINGS) ×2 IMPLANT
DRSG MEPILEX BORDER 4X8 (GAUZE/BANDAGES/DRESSINGS) IMPLANT
DRSG PAD ABDOMINAL 8X10 ST (GAUZE/BANDAGES/DRESSINGS) ×3 IMPLANT
DURAPREP 26ML APPLICATOR (WOUND CARE) ×2 IMPLANT
ELECT REM PT RETURN 15FT ADLT (MISCELLANEOUS) ×2 IMPLANT
GAUZE SPONGE 4X4 12PLY STRL (GAUZE/BANDAGES/DRESSINGS) ×2 IMPLANT
GLOVE SRG 8 PF TXTR STRL LF DI (GLOVE) ×1 IMPLANT
GLOVE SURG ENC MOIS LTX SZ7.5 (GLOVE) ×4 IMPLANT
GLOVE SURG UNDER LTX SZ7.5 (GLOVE) ×2 IMPLANT
GLOVE SURG UNDER POLY LF SZ8 (GLOVE) ×2
GOWN STRL REUS W/ TWL LRG LVL3 (GOWN DISPOSABLE) ×2 IMPLANT
GOWN STRL REUS W/TWL LRG LVL3 (GOWN DISPOSABLE) ×4
GRAFT TISS 40X70 3 THK DERM (Tissue) IMPLANT
GRAFT TISS SEMITEND 4-8 (Bone Implant) IMPLANT
IMMOBILIZER KNEE 22 UNIV (SOFTGOODS) IMPLANT
IMP SYS 2ND FIX PEEK 4.75X19.1 (Miscellaneous) ×2 IMPLANT
IMPL SYS 2ND FX PEEK 4.75X19.1 (Miscellaneous) IMPLANT
LASSO CRESCENT QUICKPASS (SUTURE) ×1 IMPLANT
NDL MA TROC 1/2 (NEEDLE) ×1 IMPLANT
NDL SUT 6 .5 CRC .975X.05 MAYO (NEEDLE) IMPLANT
NEEDLE MA TROC 1/2 (NEEDLE) ×2 IMPLANT
NEEDLE MAYO TAPER (NEEDLE) ×2
NS IRRIG 1000ML POUR BTL (IV SOLUTION) ×2 IMPLANT
PACK ORTHO EXTREMITY (CUSTOM PROCEDURE TRAY) ×2 IMPLANT
PAD CAST 4YDX4 CTTN HI CHSV (CAST SUPPLIES) IMPLANT
PADDING CAST COTTON 4X4 STRL (CAST SUPPLIES) ×2
PADDING CAST COTTON 6X4 STRL (CAST SUPPLIES) ×3 IMPLANT
RETRIEVER SUT HEWSON (MISCELLANEOUS) ×1 IMPLANT
SLEEVE SCD COMPRESS KNEE MED (STOCKING) ×1 IMPLANT
SPLINT PLASTER CAST XFAST 5X30 (CAST SUPPLIES) IMPLANT
SPLINT PLASTER XFAST SET 5X30 (CAST SUPPLIES) ×1
STOCKINETTE 8 INCH (MISCELLANEOUS) ×2 IMPLANT
SUT ETHILON 3 0 PS 1 (SUTURE) ×3 IMPLANT
SUT FIBERWIRE #2 38 REV NDL BL (SUTURE)
SUT MNCRL AB 4-0 PS2 18 (SUTURE) ×2 IMPLANT
SUT VIC AB 0 CT1 27 (SUTURE)
SUT VIC AB 0 CT1 27XBRD ANBCTR (SUTURE) IMPLANT
SUT VIC AB 1 CT1 36 (SUTURE) ×4 IMPLANT
SUT VIC AB 2-0 SH 27 (SUTURE) ×4
SUT VIC AB 2-0 SH 27XBRD (SUTURE) ×2 IMPLANT
SUTURE FIBERWR#2 38 REV NDL BL (SUTURE) IMPLANT
SUTURE TAPE 1.3 40 TPR END (SUTURE) IMPLANT
SUTURE TAPE 1.3 FIBERLOP 20 ST (SUTURE) IMPLANT
SUTURETAPE 1.3 40 TPR END (SUTURE) ×4
SUTURETAPE 1.3 FIBERLOOP 20 ST (SUTURE) ×4
SYS INTERNAL BRACE KNEE (Miscellaneous) ×2 IMPLANT
SYSTEM INTERNAL BRACE KNEE (Miscellaneous) IMPLANT
TENDON SEMI-TENDINOSUS (Bone Implant) ×2 IMPLANT
TISSUE ARTHOFLEX THICK 3MM (Tissue) ×2 IMPLANT
TOWEL OR 17X26 10 PK STRL BLUE (TOWEL DISPOSABLE) ×3 IMPLANT
TUBE SUCTION HIGH CAP CLEAR NV (SUCTIONS) ×1 IMPLANT
UNDERPAD 30X36 HEAVY ABSORB (UNDERPADS AND DIAPERS) ×2 IMPLANT
YANKAUER SUCT BULB TIP 10FT TU (MISCELLANEOUS) ×2 IMPLANT

## 2021-02-26 NOTE — Anesthesia Postprocedure Evaluation (Signed)
Anesthesia Post Note  Patient: Kristopher Gay  Procedure(s) Performed: REPAIR QUADRICEP TENDON (Right )     Patient location during evaluation: PACU Anesthesia Type: Regional and General Level of consciousness: awake and alert Pain management: pain level controlled Vital Signs Assessment: post-procedure vital signs reviewed and stable Respiratory status: spontaneous breathing, nonlabored ventilation, respiratory function stable and patient connected to nasal cannula oxygen Cardiovascular status: blood pressure returned to baseline and stable Postop Assessment: no apparent nausea or vomiting Anesthetic complications: no   No complications documented.  Last Vitals:  Vitals:   02/26/21 1500 02/26/21 1537  BP: 124/75 118/74  Pulse: (!) 57 (!) 56  Resp: 16 16  Temp:  36.7 C  SpO2: 97% 91%    Last Pain:  Vitals:   02/26/21 1537  TempSrc: Oral  PainSc:                  Twisha Vanpelt L Lakya Schrupp

## 2021-02-26 NOTE — Transfer of Care (Signed)
Immediate Anesthesia Transfer of Care Note  Patient: LEMARCUS BAGGERLY  Procedure(s) Performed: REPAIR QUADRICEP TENDON (Right )  Patient Location: PACU  Anesthesia Type:GA combined with regional for post-op pain  Level of Consciousness: awake, alert  and oriented  Airway & Oxygen Therapy: Patient Spontanous Breathing and Patient connected to face mask oxygen  Post-op Assessment: Report given to RN  Post vital signs: Reviewed and stable  Last Vitals:  Vitals Value Taken Time  BP 150/87 02/26/21 1311  Temp    Pulse 63 02/26/21 1312  Resp 16 02/26/21 1312  SpO2 98 % 02/26/21 1312  Vitals shown include unvalidated device data.  Last Pain:  Vitals:   02/26/21 0752  TempSrc: Oral         Complications: No complications documented.

## 2021-02-26 NOTE — Anesthesia Procedure Notes (Signed)
Anesthesia Regional Block: Femoral nerve block   Pre-Anesthetic Checklist: ,, timeout performed, Correct Patient, Correct Site, Correct Laterality, Correct Procedure, Correct Position, site marked, Risks and benefits discussed,  Surgical consent,  Pre-op evaluation,  At surgeon's request and post-op pain management  Laterality: Right  Prep: Maximum Sterile Barrier Precautions used, chloraprep       Needles:  Injection technique: Single-shot  Needle Type: Echogenic Stimulator Needle     Needle Length: 9cm  Needle Gauge: 22     Additional Needles:   Procedures:,,,, ultrasound used (permanent image in chart),,,,  Narrative:  Start time: 02/26/2021 9:35 AM End time: 02/26/2021 9:45 AM Injection made incrementally with aspirations every 5 mL.  Performed by: Personally  Anesthesiologist: Freddrick March, MD  Additional Notes: Monitors applied. No increased pain on injection. No increased resistance to injection. Injection made in 5cc increments. Good needle visualization. Patient tolerated procedure well.

## 2021-02-26 NOTE — Discharge Instructions (Signed)
POST-OPERATIVE OPIOID TAPER INSTRUCTIONS: . It is important to wean off of your opioid medication as soon as possible. If you do not need pain medication after your surgery it is ok to stop day one. . Opioids include: o Codeine, Hydrocodone(Norco, Vicodin), Oxycodone(Percocet, oxycontin) and hydromorphone amongst others.  . Long term and even short term use of opiods can cause: o Increased pain response o Dependence o Constipation o Depression o Respiratory depression o And more.  . Withdrawal symptoms can include o Flu like symptoms o Nausea, vomiting o And more . Techniques to manage these symptoms o Hydrate well o Eat regular healthy meals o Stay active o Use relaxation techniques(deep breathing, meditating, yoga) . Do Not substitute Alcohol to help with tapering . If you have been on opioids for less than two weeks and do not have pain than it is ok to stop all together.  . Plan to wean off of opioids o This plan should start within one week post op of your joint replacement. o Maintain the same interval or time between taking each dose and first decrease the dose.  o Cut the total daily intake of opioids by one tablet each day o Next start to increase the time between doses. o The last dose that should be eliminated is the evening dose.    

## 2021-02-26 NOTE — Anesthesia Procedure Notes (Signed)
Procedure Name: Intubation Performed by: Cleda Daub, CRNA Pre-anesthesia Checklist: Patient identified, Emergency Drugs available, Suction available and Patient being monitored Patient Re-evaluated:Patient Re-evaluated prior to induction Oxygen Delivery Method: Circle system utilized Preoxygenation: Pre-oxygenation with 100% oxygen Induction Type: IV induction LMA: LMA inserted LMA Size: 5.0 Number of attempts: 1 Placement Confirmation: breath sounds checked- equal and bilateral and positive ETCO2 Tube secured with: Tape Dental Injury: Teeth and Oropharynx as per pre-operative assessment

## 2021-02-26 NOTE — Progress Notes (Signed)
Assisted Dr. Hulan Fray with Right Femoral  block. Side rails up, monitors on throughout procedure. See vital signs in flow sheet. Tolerated Procedure well.

## 2021-02-26 NOTE — Anesthesia Preprocedure Evaluation (Addendum)
Anesthesia Evaluation  Patient identified by MRN, date of birth, ID band Patient awake    Reviewed: Allergy & Precautions, NPO status , Patient's Chart, lab work & pertinent test results, reviewed documented beta blocker date and time   History of Anesthesia Complications (+) PONV  Airway Mallampati: II  TM Distance: >3 FB Neck ROM: Full    Dental no notable dental hx. (+) Teeth Intact, Dental Advisory Given   Pulmonary asthma , COPD,  COPD inhaler,    Pulmonary exam normal breath sounds clear to auscultation       Cardiovascular hypertension, Pt. on home beta blockers and Pt. on medications + Peripheral Vascular Disease  Normal cardiovascular exam+ dysrhythmias Atrial Fibrillation  Rhythm:Regular Rate:Normal  TTE 2019 - Normal LV systolic function; mild diastolic dysfunction; mild LVH; mild biatrial enlagement; mild RVE; moderate TR; moderate tosevere pulmonary hypertension.    Neuro/Psych  Headaches, PSYCHIATRIC DISORDERS Anxiety Depression    GI/Hepatic Neg liver ROS, GERD  ,  Endo/Other  negative endocrine ROS  Renal/GU negative Renal ROS  negative genitourinary   Musculoskeletal negative musculoskeletal ROS (+)   Abdominal   Peds  Hematology negative hematology ROS (+)   Anesthesia Other Findings   Reproductive/Obstetrics                            Anesthesia Physical Anesthesia Plan  ASA: III  Anesthesia Plan: General and Regional   Post-op Pain Management:  Regional for Post-op pain   Induction: Intravenous  PONV Risk Score and Plan: 3 and Midazolam, Dexamethasone and Ondansetron  Airway Management Planned: Oral ETT  Additional Equipment:   Intra-op Plan:   Post-operative Plan: Extubation in OR  Informed Consent: I have reviewed the patients History and Physical, chart, labs and discussed the procedure including the risks, benefits and alternatives for the proposed  anesthesia with the patient or authorized representative who has indicated his/her understanding and acceptance.     Dental advisory given  Plan Discussed with: CRNA  Anesthesia Plan Comments:         Anesthesia Quick Evaluation

## 2021-02-26 NOTE — Interval H&P Note (Signed)
History and Physical Interval Note:  02/26/2021 9:04 AM  Kristopher Gay  has presented today for surgery, with the diagnosis of RIGHT QUAD TENDON RUPTURE.  The various methods of treatment have been discussed with the patient and family. After consideration of risks, benefits and other options for treatment, the patient has consented to  Procedure(s): REPAIR QUADRICEP TENDON (Right) as a surgical intervention.  The patient's history has been reviewed, patient examined, no change in status, stable for surgery.  I have reviewed the patient's chart and labs.  Questions were answered to the patient's satisfaction.     Renette Butters

## 2021-02-27 MED ORDER — GABAPENTIN 300 MG PO CAPS
600.0000 mg | ORAL_CAPSULE | Freq: Two times a day (BID) | ORAL | Status: DC
  Administered 2021-02-27 – 2021-03-01 (×4): 600 mg via ORAL
  Filled 2021-02-27 (×4): qty 2

## 2021-02-27 MED ORDER — ALPRAZOLAM 0.5 MG PO TABS: 0.5000 mg | ORAL_TABLET | Freq: Three times a day (TID) | ORAL | Status: DC | PRN

## 2021-02-27 MED ORDER — FLUTICASONE FUROATE-VILANTEROL 100-25 MCG/INH IN AEPB
1.0000 | INHALATION_SPRAY | Freq: Every day | RESPIRATORY_TRACT | Status: DC
  Administered 2021-02-27 – 2021-03-01 (×3): 1 via RESPIRATORY_TRACT
  Filled 2021-02-27: qty 28

## 2021-02-27 MED ORDER — GABAPENTIN 300 MG PO CAPS: 300.0000 mg | ORAL_CAPSULE | ORAL | Status: DC

## 2021-02-27 MED ORDER — UMECLIDINIUM BROMIDE 62.5 MCG/INH IN AEPB
1.0000 | INHALATION_SPRAY | Freq: Every day | RESPIRATORY_TRACT | Status: DC
  Administered 2021-02-27 – 2021-03-01 (×3): 1 via RESPIRATORY_TRACT
  Filled 2021-02-27: qty 7

## 2021-02-27 MED ORDER — GABAPENTIN 100 MG PO CAPS
100.0000 mg | ORAL_CAPSULE | ORAL | Status: DC
  Administered 2021-02-27 – 2021-03-01 (×3): 100 mg via ORAL
  Filled 2021-02-27 (×3): qty 1

## 2021-02-27 NOTE — Evaluation (Signed)
Physical Therapy Evaluation Patient Details Name: Kristopher Gay MRN: [id number] DOB: [birth year] Today's Date: 02/27/2021   History of Present Illness  Patient is a 70 year old male s/p quad tendon repair on 02/26/21. History includes original repair 10/21, HTN, A-fib, L drop foot  Clinical Impression  Patient is s/p above surgery resulting in functional limitations due to the deficits listed below (see PT Problem List).  Patient will benefit from skilled PT to increase their independence and safety with mobility to allow discharge to the venue listed below.   Pt reported his entire medical journey of quad tendon and very tangential.  Pt reports he was ambulating with RW prior to admission.  Pt states he had a hard time with NWB status after previous quad tendon repair and states he has no help available at home.  Pt initially agreeable to mobilize and then declined due to pain however did not want any "heavy narcotics."  Pt will likely need SNF for rehab upon d/c.  Pt aware he is NWB at this time and currently has splint to right knee to maintain extension.      Follow Up Recommendations SNF    Equipment Recommendations  None recommended by PT    Recommendations for Other Services       Precautions / Restrictions Precautions Precautions: Fall;Knee Precaution Comments: no flexion Required Braces or Orthoses: Splint/Cast Splint/Cast: R LE Restrictions Weight Bearing Restrictions: Yes RLE Weight Bearing: Non weight bearing      Mobility  Bed Mobility Overal bed mobility: Needs Assistance Bed Mobility: Supine to Sit     Supine to sit: Min guard;HOB elevated     General bed mobility comments: agreeable at first and then reports no to mobilizing due to pain    Transfers Overall transfer level: Needs assistance Equipment used: Rolling walker (2 wheeled) Transfers: Sit to/from [facility] Sit to Stand: Min assist;From elevated surface Stand pivot transfers: Min  assist       General transfer comment: bed height elevated, needed mod cues during pivot to bedside commode to keep R heel off the ground as patient is NWB  Ambulation/Gait                Stairs            Wheelchair Mobility    Modified Rankin (Stroke Patients Only)       Balance Overall balance assessment: Needs assistance Sitting-balance support: Feet supported Sitting balance-Leahy Scale: Fair     Standing balance support: Bilateral upper extremity supported Standing balance-Leahy Scale: Poor Standing balance comment: reliant on UE support                             Pertinent Vitals/Pain Pain Assessment: 0-10 Pain Score: 8  Pain Location: R knee Pain Descriptors / Indicators: Pressure;Tightness;Throbbing Pain Intervention(s): Repositioned;Monitored during session (requests only tylenol and muscel relaxer)    Home Living Family/patient expects to be discharged to:: [place]: Alone                    Prior Function Level of Independence: Needs assistance   Gait / Transfers Assistance Needed: ambulating with walker  ADL's / Homemaking Assistance Needed: patient reports hired caregiver assist after D/C from SNF in Dec 2021 however "they got lazy" then started dressing/ sponge bathing himself around Jan 2022 with use of stool        Hand Dominance  Dominant Hand: Right    Extremity/Trunk Assessment   Upper Extremity Assessment Upper Extremity Assessment: RUE deficits/detail;LUE deficits/detail RUE Deficits / Details: reports "bad" shoulder LUE Deficits / Details: reports recent left upper forearm and elbow pain from performing transfers, limited active elbow extension observed    Lower Extremity Assessment Lower Extremity Assessment: RLE deficits/detail;LLE deficits/detail;Generalized weakness RLE Deficits / Details: in splint with ace wrap for maintaining extension LLE Deficits /  Details: hx left foot drop    Cervical / Trunk Assessment Cervical / Trunk Assessment: Normal  Communication   Communication: No difficulties  Cognition Arousal/Alertness: Awake/alert Behavior During Therapy: WFL for tasks assessed/performed Overall Cognitive Status: Within Functional Limits for tasks assessed                                 General Comments: very tangential      General Comments      Exercises     Assessment/Plan    PT Assessment Patient needs continued PT services  PT Problem List Decreased strength;Decreased range of motion;Decreased mobility;Decreased knowledge of precautions;Pain;Decreased balance;Decreased knowledge of use of DME;Decreased activity tolerance       PT Treatment Interventions Gait training;DME instruction;Therapeutic exercise;Balance training;Functional mobility training;Therapeutic activities;Patient/family education;Wheelchair mobility training    PT Goals (Current goals can be found in the Care Plan section)  Acute Rehab PT Goals Patient Stated Goal: go 'to rehab PT Goal Formulation: With patient Time For Goal Achievement: 03/13/21 Potential to Achieve Goals: Good    Frequency Min 3X/week   Barriers to discharge        Co-evaluation               AM-PAC PT "6 Clicks" Mobility  Outcome Measure Help needed turning from your back to your side while in a flat bed without using bedrails?: A Little Help needed moving from lying on your back to sitting on the side of a flat bed without using bedrails?: A Little Help needed moving to and from a bed to a chair (including a wheelchair)?: A Little Help needed standing up from a chair using your arms (e.g., wheelchair or bedside chair)?: A Little Help needed to walk in hospital room?: A Lot Help needed climbing 3-5 steps with a railing? : A Lot 6 Click Score: 16    End of Session   Activity Tolerance: Patient tolerated treatment well Patient left: in bed;with  call bell/phone within reach   PT Visit Diagnosis: Other abnormalities of gait and mobility (R26.89)    Time: 4098-1191 PT Time Calculation (min) (ACUTE ONLY): 34 min   Charges:   PT Evaluation $PT Eval Low Complexity: 1 Low     Kati PT, DPT Acute Rehabilitation Services Pager: [phone] Office: [place] 02/27/2021, 11:31 AM

## 2021-02-27 NOTE — NC FL2 (Signed)
[place] LEVEL OF CARE SCREENING TOOL     IDENTIFICATION  Patient Name: Kristopher Gay Birthdate: [birth year] Sex: male Admission Date (Current Location): 02/26/2021  County and [place] Number:  Herbalist and Address:  [facility],  [place], [place]      Provider Number: [id number]  Attending Physician Name and Address:  Renette Butters, MD  Relative Name and Phone Number:  Tenny Craw (sister) Ph: [phone]    Current Level of Care: Hospital Recommended Level of Care: [place] Prior Approval Number:    Date Approved/Denied:   PASRR Number: [id number]  Discharge Plan: SNF    Current Diagnoses: Patient Active Problem List   Diagnosis Date Noted  . Rupture of quadriceps tendon, right, sequela 02/26/2021  . Rupture of right quadriceps tendon 08/07/2020  . Ulnar neuropathy at elbow, right 05/03/2018  . Neuritis of right ulnar nerve 04/27/2018  . PAF (paroxysmal atrial fibrillation) ([place]) 02/20/2018  . Chronic right shoulder pain 05/15/2016  . Right knee pain 05/15/2016  . Adrenal adenoma 02/20/2016  . Chronic pain syndrome 10/12/2015  . Dysphagia, pharyngoesophageal phase 09/14/2014  . Other chronic postoperative pain 01/16/2014  . Allergy, insect bite 07/02/2012  . GERD (gastroesophageal reflux disease) 03/25/2012  . 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 Lumbosacral spondylosis 01/13/2012  . Chronic back pain 11/17/2011  . Seasonal and perennial allergic rhinitis 12/26/2010  . Chronic pain 04/16/2009  . COPD (chronic obstructive pulmonary disease) ([place]) 01/30/2008    Orientation RESPIRATION BLADDER Height & Weight     Self,Time,Situation,Place  Normal Continent Weight: 250 lb (113.4 kg) Height:     BEHAVIORAL SYMPTOMS/MOOD NEUROLOGICAL BOWEL NUTRITION STATUS      Continent Diet (Regular diet)  AMBULATORY STATUS COMMUNICATION OF NEEDS Skin   Extensive Assist Verbally Surgical  wounds                       Personal Care Assistance Level of Assistance  Bathing,Feeding,Dressing Bathing Assistance: Limited assistance Feeding assistance: Independent Dressing Assistance: Limited assistance     Functional Limitations Info  Sight,Hearing,Speech Sight Info: Adequate Hearing Info: Adequate Speech Info: Adequate    SPECIAL CARE FACTORS FREQUENCY  OT (By licensed OT),PT (By licensed PT)     PT Frequency: 5x's/week OT Frequency: 5x's/week            Contractures Contractures Info: Not present    Additional Factors Info  Code Status,Allergies,Psychotropic Code Status Info: Full Allergies Info: Bee Venom, Linzess (Linaclotide), Molds & Smuts, Seasonal Ic (Cholestatin) Psychotropic Info: Neurontin (gabapentin)         Current Medications (02/27/2021):  This is the current hospital active medication list Current Facility-Administered Medications  Medication Dose Route Frequency Provider Last Rate Last Admin  . acetaminophen (TYLENOL) tablet 1,000 mg  1,000 mg Oral Q6H Aggie Moats M, PA-C   1,000 mg at 02/27/21 2542  . acetaminophen (TYLENOL) tablet 325-650 mg  325-650 mg Oral Q6H PRN Aggie Moats M, PA-C      . atorvastatin (LIPITOR) tablet 40 mg  40 mg Oral Daily Aggie Moats M, PA-C   40 mg at 02/26/21 2039  . bisacodyl (DULCOLAX) suppository 10 mg  10 mg Rectal Daily PRN Aggie Moats M, PA-C      . diphenhydrAMINE (BENADRYL) 12.5 MG/5ML elixir 12.5-25 mg  12.5-25 mg Oral Q4H PRN [name], [name], PA-C      . docusate sodium (COLACE) capsule 100 mg  100  mg Oral BID Aggie Moats M, PA-C   100 mg at 02/27/21 1050  . enoxaparin (LOVENOX) injection 40 mg  40 mg Subcutaneous Q24H Aggie Moats M, PA-C   40 mg at 02/27/21 0800  . fluticasone furoate-vilanterol (BREO ELLIPTA) 100-25 MCG/INH 1 puff  1 puff Inhalation Daily [name], [name], PA-C      . furosemide (LASIX) tablet 20 mg  20 mg Oral Daily Aggie Moats M, PA-C   20 mg at 02/27/21 1049  .  gabapentin (NEURONTIN) capsule 600 mg  600 mg Oral BID [name], [name], PA-C       And  . gabapentin (NEURONTIN) capsule 100 mg  100 mg Oral Q24H [name], [name], PA-C      . HYDROmorphone (DILAUDID) injection 0.5-1 mg  0.5-1 mg Intravenous Q4H PRN [name], [name], PA-C      . magnesium citrate solution 1 Bottle  1 Bottle Oral Once PRN Aggie Moats M, PA-C      . methocarbamol (ROBAXIN) tablet 500 mg  500 mg Oral Q6H PRN Aggie Moats M, PA-C   500 mg at 02/27/21 9163   Or  . methocarbamol (ROBAXIN) 500 mg in dextrose 5 % 50 mL IVPB  500 mg Intravenous Q6H PRN [name], [name], PA-C      . metoCLOPramide (REGLAN) tablet 5-10 mg  5-10 mg Oral Q8H PRN [name], [name], PA-C       Or  . metoCLOPramide (REGLAN) injection 5-10 mg  5-10 mg Intravenous Q8H PRN [name], [name], PA-C      . metoprolol succinate (TOPROL-XL) 24 hr tablet 25 mg  25 mg Oral Daily Aggie Moats M, PA-C   25 mg at 02/27/21 1049  . montelukast (SINGULAIR) tablet 10 mg  10 mg Oral Daily [name], [name], PA-C      . ondansetron (ZOFRAN) tablet 4 mg  4 mg Oral Q6H PRN Aggie Moats M, PA-C       Or  . ondansetron (ZOFRAN) injection 4 mg  4 mg Intravenous Q6H PRN Aggie Moats M, PA-C   4 mg at 02/26/21 1704  . oxyCODONE (Oxy IR/ROXICODONE) immediate release tablet 10-15 mg  10-15 mg Oral Q4H PRN Aggie Moats M, PA-C   10 mg at 02/26/21 1702  . oxyCODONE (Oxy IR/ROXICODONE) immediate release tablet 5-10 mg  5-10 mg Oral Q4H PRN [name], [name], PA-C      . pantoprazole (PROTONIX) EC tablet 40 mg  40 mg Oral Daily [name], [name], PA-C      . polyethylene glycol (MIRALAX / GLYCOLAX) packet 17 g  17 g Oral Daily PRN Britt Bottom, PA-C   17 g at 02/26/21 2045  . traMADol (ULTRAM) tablet 50 mg  50 mg Oral Q6H [name], [name], PA-C      . umeclidinium bromide (INCRUSE ELLIPTA) 62.5 MCG/INH 1 puff  1 puff Inhalation Daily Britt Bottom, PA-C         Discharge Medications: Please see discharge summary for a list of discharge  medications.  Relevant Imaging Results:  Relevant Lab Results:   Additional Information SSN: [id number]  Sherie Don, LCSW

## 2021-02-27 NOTE — Care Management Obs Status (Signed)
[place] NOTIFICATION   Patient Details  Name: Kristopher Gay MRN: [id number] Date of Birth: 06-23-1951   Medicare Observation Status Notification Given:  Yes    Sherie Don, LCSW 02/27/2021, 2:23 PM

## 2021-02-27 NOTE — Progress Notes (Signed)
Patient refused anxiety medication.

## 2021-02-27 NOTE — Progress Notes (Signed)
Subjective: Patient reports pain as mild to moderate. Well controlled with medicine. Tolerating diet. Worried about taking narcotics since they cause bad constipation. Urinating. No CP, SOB. Hasn't worked with PT/OT on mobilizing OOB yet. Voicing concerns about ability to move around post-op since he has a bad right shoulder and his left elbow has been hurting for a week. Worried about sitting in the bedside chair as he feels it is too low to the ground. Lots of questions. Anxious  Objective:   VITALS:   Vitals:   02/26/21 1744 02/26/21 2014 02/27/21 0056 02/27/21 0516  BP: 117/74 109/66 115/73 112/63  Pulse: (!) 56 65 (!) 53 (!) 51  Resp: 18 16 16 18   Temp: 97.8 F (36.6 C) 98.3 F (36.8 C) 97.7 F (36.5 C) 97.8 F (36.6 C)  TempSrc: Oral Oral Oral Oral  SpO2: 93% 91% 96% 96%  Weight:       CBC Latest Ref Rng & Units 02/26/2021 02/06/2021 08/07/2020  WBC 4.0 - 10.5 K/uL 7.7 6.5 8.2  Hemoglobin 13.0 - 17.0 g/dL 15.1 15.5 12.4(L)  Hematocrit 39.0 - 52.0 % 42.6 45.4 36.4(L)  Platelets 150 - 400 K/uL 158 145(L) 176   BMP Latest Ref Rng & Units 02/26/2021 02/06/2021 11/20/2020  Glucose 70 - 99 mg/dL 141(H) 107(H) -  BUN 8 - 23 mg/dL 24(H) 19 -  Creatinine 0.61 - 1.24 mg/dL 1.02 1.06 -  BUN/Creat Ratio 10 - 24 - - -  Sodium 135 - 145 mmol/L 140 141 140  Potassium 3.5 - 5.1 mmol/L 3.6 4.5 4.7  Chloride 98 - 111 mmol/L 109 108 102  CO2 22 - 32 mmol/L 22 25 25   Calcium 8.9 - 10.3 mg/dL 9.4 9.3 -   Intake/Output      05/03 0701 05/04 0700 05/04 0701 05/05 0700   P.O. 1180    I.V. (mL/kg) 1000 (8.8)    IV Piggyback 200    Total Intake(mL/kg) 2380 (21)    Urine (mL/kg/hr) 1000    Stool 0    Blood 10    Total Output 1010    Net +1370         Urine Occurrence 1 x    Stool Occurrence 1 x       Physical Exam: General: NAD. Laying in bed watching tv. Comfortable. Anxious. Talking a lot.  Resp: No increased wob Cardio: regular rate and rhythm ABD soft Neurologically  intact MSK Neurovascularly intact Sensation intact distally Intact pulses distally Dorsiflexion/Plantar flexion intact Incision: dressing C/D/I, 3 sided plaster splint in place with ACE wrap on top   Assessment: 1 Day Post-Op  S/P Procedure(s) (LRB): REPAIR QUADRICEP TENDON (Right) by Dr. Ernesta Amble. Murphy on 02/26/21  Active Problems:   Rupture of quadriceps tendon, right, sequela   Plan: Work on decreasing anxiety. May need Xanax PRN Advance diet Up with therapy Incentive Spirometry Elevate and Apply ice  Weightbearing: NWB RLE Insicional and dressing care: Dressings left intact until follow-up and Reinforce dressings as needed Orthopedic device(s): Splint Showering: Keep dressing dry VTE prophylaxis: Aspirin 81mg  BID x 30 days postop, SCDs, ambulation Pain control: Continue current regimen. Follow - up plan: 2 weeks Contact information:  Edmonia Lynch MD, Aggie Moats PA-C  Dispo: TBD based on PT/OT evals. Thus far patient would prefer to go to a SNF for a short period to recover. Lives alone. Closest help is over an hour away.   Anticipated LOS equal to or greater than 2 midnights due to -  Age 71 and older with one or more of the following:  - Obesity  - Expected need for hospital services (PT, OT, Nursing) required for safe  discharge  - Anticipated need for postoperative skilled nursing care or inpatient rehab  - Active co-morbidities: None OR   - Unanticipated findings during/Post Surgery: Slow post-op progression: GI, pain control, mobility  - Patient is a high risk of re-admission due to: Barriers to post-acute care (logistical, no family support in home)   Alisa Graff Office [phone] 02/27/2021, 8:21 AM

## 2021-02-27 NOTE — Progress Notes (Signed)
PT Cancellation Note  Patient Details Name: TYSEAN VANDERVLIET MRN: [id number] DOB: Apr 29, 1951   Cancelled Treatment:    Reason Eval/Treat Not Completed: Patient declined, no reason specified  Returned this afternoon to mobilize pt, and pt declines OOB at this time due to right knee edema.  He prefers to keeps LEs elevated and ice in place.   Jeilyn Reznik,KATHrine E 02/27/2021, 4:04 PM

## 2021-02-27 NOTE — TOC Initial Note (Signed)
Transition of Care [facility]) - Initial/Assessment Note   Patient Details  Name: Kristopher Gay MRN: [id number] Date of Birth: 1951/06/12  [facility]) CM/SW Contact:    Sherie Don, LCSW Phone Number: 02/27/2021, 1:16 PM  Clinical Narrative: Patient is a 70 year old male who is under observation for rupture of right quadriceps tendon. PT and OT evaluations recommended SNF. CSW met with patient and patient confirmed his plan was to go to SNF before the surgery as he cannot manage at home alone without rehab. Patient has been vaccinated and boosted for COVID.  FL2 completed; PASRR confirmed. Initial referral faxed out. CSW called Bernadene Bell to start insurance authorization. Reference # is: [id number]. Clinicals faxed to [facility] for review. TOC awaiting bed offers and a negative COVID test.  Expected Discharge Plan: [place] Barriers to Discharge: Continued Medical Work up  Patient Goals and CMS Choice Patient states their goals for this hospitalization and ongoing recovery are:: Go to rehab CMS Medicare.gov Compare Post Acute Care list provided to:: Patient Choice offered to / list presented to : Patient  Expected Discharge Plan and Services Expected Discharge Plan: [place] In-house Referral: Clinical Social Work Post Acute Care Choice: [place] Living arrangements for the past 2 months: [place]              DME Arranged: N/A DME Agency: NA  Prior Living Arrangements/Services Living arrangements for the past 2 months: Single Family Home Lives with:: Self Patient language and need for interpreter reviewed:: Yes Do you feel safe going back to the place where you live?: Yes      Need for Family Participation in Patient Care: No (Comment) Care giver support system in place?: No (comment) Current home services: DME (Rolling walker, cane) Criminal Activity/Legal Involvement Pertinent to Current Situation/Hospitalization:  No - Comment as needed  Activities of Daily Living Home Assistive Devices/Equipment: Eyeglasses,Walker (specify type),Brace (specify type),Cane (specify quad or straight) ADL Screening (condition at time of admission) Patient's cognitive ability adequate to safely complete daily activities?: Yes Is the patient deaf or have difficulty hearing?: No Does the patient have difficulty seeing, even when wearing glasses/contacts?: No Does the patient have difficulty concentrating, remembering, or making decisions?: No Patient able to express need for assistance with ADLs?: Yes Does the patient have difficulty dressing or bathing?: Yes Independently performs ADLs?: Yes (appropriate for developmental age) Does the patient have difficulty walking or climbing stairs?: No Weakness of Legs: None Weakness of Arms/Hands: None  Permission Sought/Granted Permission sought to share information with : Facility Art therapist granted to share information with : Yes, Verbal Permission Granted Permission granted to share info w AGENCY: SNFs  Emotional Assessment Appearance:: Appears stated age Attitude/Demeanor/Rapport: Engaged Affect (typically observed): Accepting Orientation: : Oriented to Self,Oriented to Place,Oriented to  Time,Oriented to Situation Alcohol / Substance Use: Not Applicable Psych Involvement: No (comment)  Admission diagnosis:  Rupture of quadriceps tendon, right, sequela [S76.111S] Patient Active Problem List   Diagnosis Date Noted  . Rupture of quadriceps tendon, right, sequela 02/26/2021  . Rupture of right quadriceps tendon 08/07/2020  . Ulnar neuropathy at elbow, right 05/03/2018  . Neuritis of right ulnar nerve 04/27/2018  . PAF (paroxysmal atrial fibrillation) ([place]) 02/20/2018  . Chronic right shoulder pain 05/15/2016  . Right knee pain 05/15/2016  . Adrenal adenoma 02/20/2016  . Chronic pain syndrome 10/12/2015  . Dysphagia, pharyngoesophageal phase  09/14/2014  . Other chronic postoperative pain 01/16/2014  .  Allergy, insect bite 07/02/2012  . GERD (gastroesophageal reflux disease) 03/25/2012  . 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 Lumbosacral spondylosis 01/13/2012  . Chronic back pain 11/17/2011  . Seasonal and perennial allergic rhinitis 12/26/2010  . Chronic pain 04/16/2009  . COPD (chronic obstructive pulmonary disease) ([place]) 01/30/2008   PCP:  [name], MD Pharmacy:   [facility] [place], [place] - [address] [place] [address] Phone: [phone] Fax: [place] #[id number] [place], [place] - [address] [place] [address] Phone: [phone] Fax: [phone]  Readmission Risk Interventions No flowsheet data found.

## 2021-02-27 NOTE — Plan of Care (Signed)
  Problem: Pain Managment: Goal: General experience of comfort will improve Outcome: Progressing   Problem: Coping: Goal: Level of anxiety will decrease Outcome: Progressing   

## 2021-02-27 NOTE — Progress Notes (Signed)
Called Dr. Percell Miller regarding Patient concerns regarding his knees. No new orders at this time

## 2021-02-27 NOTE — Evaluation (Signed)
Occupational Therapy Evaluation Patient Details Name: Kristopher Gay MRN: [id number] DOB: Feb 19, 1951 Today's Date: 02/27/2021    History of Present Illness Patient is a 70 year old male s/p quad tendon repair on 02/26/21. History includes original repair 10/21, HTN, A-fib, L drop foot   Clinical Impression   Patient lives alone, reports was getting by needing significant time to perform ADL tasks prior to this surgery. Was using a stool to support his R leg in order to perform sponge bathing and dressing with adaptive equipment. Patient ambulating with a walker inside the house, had medical transport to get to outpatient physical therapy. Currently patient reporting 8/10 pain in R leg/knee and needing overall min A for functional transfers with mod cues to lift heel as patient is NWB for at least 2 weeks. Recommend continued acute OT services to maximize patient activity tolerance, safety awareness in order to facilitate D/C to venue listed below.    Follow Up Recommendations  SNF    Equipment Recommendations  3 in 1 bedside commode;Other (comment) (if patient does not have)       Precautions / Restrictions Precautions Precautions: Fall Required Braces or Orthoses: Splint/Cast Splint/Cast: R LE Restrictions Weight Bearing Restrictions: Yes RLE Weight Bearing: Non weight bearing      Mobility Bed Mobility Overal bed mobility: Needs Assistance Bed Mobility: Supine to Sit     Supine to sit: Min guard;HOB elevated          Transfers Overall transfer level: Needs assistance Equipment used: Rolling walker (2 wheeled) Transfers: Sit to/from [facility] Sit to Stand: Min assist;From elevated surface Stand pivot transfers: Min assist       General transfer comment: bed height elevated, needed mod cues during pivot to bedside commode to keep R heel off the ground as patient is NWB    Balance Overall balance assessment: Needs assistance Sitting-balance support:  Feet supported Sitting balance-Leahy Scale: Fair     Standing balance support: Bilateral upper extremity supported Standing balance-Leahy Scale: Poor Standing balance comment: reliant on UE support                           ADL either performed or assessed with clinical judgement   ADL Overall ADL's : Needs assistance/impaired Eating/Feeding: Independent;Bed level   Grooming: Set up;Sitting;Bed level   Upper Body Bathing: Set up;Sitting;Bed level   Lower Body Bathing: Moderate assistance;Sitting/lateral leans;Bed level   Upper Body Dressing : Set up;Sitting;Bed level   Lower Body Dressing: Moderate assistance;Sitting/lateral leans;Bed level   Toilet Transfer: Minimal assistance;Stand-pivot;Cueing for safety;Cueing for sequencing;BSC;RW Toilet Transfer Details (indicate cue type and reason): patient putting R heel to floor providing mod cues for maintaining NWB   Toileting - Clothing Manipulation Details (indicate cue type and reason): patient requesting to "sit a while" therefore CNA assisted patient off commode     Functional mobility during ADLs: Minimal assistance;Rolling walker;Cueing for safety;Cueing for sequencing General ADL Comments: patient requiring increased assistance with self care tasks due to WB restrictions, R LE pain limiting his overall activity tolerance, balance and safety                  Pertinent Vitals/Pain Pain Assessment: 0-10 Pain Score: 8  Pain Location: R knee Pain Descriptors / Indicators: Pressure;Tightness;Throbbing Pain Intervention(s): Monitored during session;Premedicated before session     Hand Dominance Right   Extremity/Trunk Assessment Upper Extremity Assessment Upper Extremity Assessment: Overall WFL for tasks assessed  Lower Extremity Assessment Lower Extremity Assessment: Defer to PT evaluation   Cervical / Trunk Assessment Cervical / Trunk Assessment: Normal   Communication Communication Communication:  No difficulties   Cognition Arousal/Alertness: Awake/alert Behavior During Therapy: Anxious Overall Cognitive Status: Within Functional Limits for tasks assessed                                 General Comments: very tangential              Home Living Family/patient expects to be discharged to:: Skilled nursing facility Living Arrangements: Alone                                      Prior Functioning/Environment Level of Independence: Needs assistance  Gait / Transfers Assistance Needed: ambulating with walker ADL's / Homemaking Assistance Needed: patient reports hired caregiver assist after D/C from SNF in Dec 2021 however "they got lazy" then started dressing/ sponge bathing himself around Jan 2022 with use of stool            OT Problem List: Pain;Decreased activity tolerance;Impaired balance (sitting and/or standing);Decreased safety awareness      OT Treatment/Interventions: Self-care/ADL training;DME and/or AE instruction;Therapeutic activities;Patient/family education;Balance training    OT Goals(Current goals can be found in the care plan section) Acute Rehab OT Goals Patient Stated Goal: go 'to rehab OT Goal Formulation: With patient Time For Goal Achievement: 03/13/21 Potential to Achieve Goals: Good  OT Frequency: Min 2X/week   Barriers to D/C: Decreased caregiver support  lives home alone, limited assistance available          AM-PAC OT "6 Clicks" Daily Activity     Outcome Measure Help from another person eating meals?: None Help from another person taking care of personal grooming?: A Little Help from another person toileting, which includes using toliet, bedpan, or urinal?: A Lot Help from another person bathing (including washing, rinsing, drying)?: A Lot Help from another person to put on and taking off regular upper body clothing?: A Little Help from another person to put on and taking off regular lower body  clothing?: A Lot 6 Click Score: 16   End of Session Equipment Utilized During Treatment: Gait belt;Rolling walker Nurse Communication: Mobility status  Activity Tolerance: Patient tolerated treatment well Patient left: Other (comment);with call bell/phone within reach (seated on BSC, CNA notified)  OT Visit Diagnosis: Pain;Unsteadiness on feet (R26.81);Other abnormalities of gait and mobility (R26.89) Pain - Right/Left: Right Pain - part of body: Leg;Knee                Time: 2563-8937 OT Time Calculation (min): 33 min Charges:  OT General Charges $OT Visit: 1 Visit OT Evaluation $OT Eval Low Complexity: 1 Low OT Treatments $Self Care/Home Management : 8-22 mins  Delbert Phenix OT OT pager: [place] 02/27/2021, 10:27 AM

## 2021-02-27 NOTE — Progress Notes (Signed)
Had conversation with patient regarding plans of care and options for pain relief, one pillow placed under right lower leg at this time and ice packs refilled, tylenol and robaxin given, right lower leg has ace wrap in place with splint cast underneath, right foot is warm and dry, toes with brisk cap refill, and +2 pedal pulse present, discussed evening medications scheduled and patient in agreement with trying scheduled ultram.

## 2021-02-28 ENCOUNTER — Encounter (HOSPITAL_COMMUNITY): Payer: Self-pay | Admitting: Orthopedic Surgery

## 2021-02-28 DIAGNOSIS — X58XXXA Exposure to other specified factors, initial encounter: Secondary | ICD-10-CM | POA: Diagnosis present

## 2021-02-28 DIAGNOSIS — J309 Allergic rhinitis, unspecified: Secondary | ICD-10-CM | POA: Diagnosis present

## 2021-02-28 DIAGNOSIS — Z7982 Long term (current) use of aspirin: Secondary | ICD-10-CM | POA: Diagnosis not present

## 2021-02-28 DIAGNOSIS — Z820 Family history of epilepsy and other diseases of the nervous system: Secondary | ICD-10-CM | POA: Diagnosis not present

## 2021-02-28 DIAGNOSIS — Y929 Unspecified place or not applicable: Secondary | ICD-10-CM | POA: Diagnosis not present

## 2021-02-28 DIAGNOSIS — Z79899 Other long term (current) drug therapy: Secondary | ICD-10-CM | POA: Diagnosis not present

## 2021-02-28 DIAGNOSIS — F419 Anxiety disorder, unspecified: Secondary | ICD-10-CM | POA: Diagnosis present

## 2021-02-28 DIAGNOSIS — I10 Essential (primary) hypertension: Secondary | ICD-10-CM | POA: Diagnosis present

## 2021-02-28 DIAGNOSIS — Z888 Allergy status to other drugs, medicaments and biological substances status: Secondary | ICD-10-CM | POA: Diagnosis not present

## 2021-02-28 DIAGNOSIS — Z20822 Contact with and (suspected) exposure to covid-19: Secondary | ICD-10-CM | POA: Diagnosis present

## 2021-02-28 DIAGNOSIS — J439 Emphysema, unspecified: Secondary | ICD-10-CM | POA: Diagnosis present

## 2021-02-28 DIAGNOSIS — Z91014 Allergy to mammalian meats: Secondary | ICD-10-CM | POA: Diagnosis not present

## 2021-02-28 DIAGNOSIS — K219 Gastro-esophageal reflux disease without esophagitis: Secondary | ICD-10-CM | POA: Diagnosis present

## 2021-02-28 DIAGNOSIS — S76111A Strain of right quadriceps muscle, fascia and tendon, initial encounter: Secondary | ICD-10-CM | POA: Diagnosis present

## 2021-02-28 LAB — SARS CORONAVIRUS 2 (TAT 6-24 HRS): SARS Coronavirus 2: NEGATIVE

## 2021-02-28 NOTE — Progress Notes (Signed)
Occupational Therapy Treatment Patient Details Name: Kristopher Gay MRN: [id number] DOB: [birth year] Today's Date: 02/28/2021    History of present illness Patient is a 70 year old male s/p quad tendon repair on 02/26/21. History includes original repair 10/21, HTN, A-fib, L drop foot   OT comments  Therapist in room for excessive amount of time due to patient's loquaciousness. Patient eventually min guard to transfer to side of bed and pivot to [facility] to demonstrate functional mobility for therapist. Declined transfer into sitting on BSC due to reports of pain. Patient able to stand with RW for 10 minutes - due to prolonged conversation and then therapist changing linen on bed. Patient does keep foot on ground but without the weight. Patient educated on weight bearing status but continues to keep foot on floor. Therapist did not let therapist touch him during transfer - stating "you touching me is throwing my balance off." Patient required min assist to return to supine for RLE with patient providing very specific instructions for the therapist. Patient laments about the discomfort from the cast, why he needs to participate in therapy/getting up with therapy when he doesn't have a need too (no functional reason to move), and that what he needs more than anything is time to recover. Therapist attempted to educate but patient has a tendency to overwhelm the conversation with his own words. Patient was agreeable to therapy eventually and doesn't want to be seen as "non cooperative" but also has his own thoughts in regards to his own recovery. Will continue to follow acutely.   Follow Up Recommendations  SNF    Equipment Recommendations  Other (comment) (defer to next venue)    Recommendations for Other Services      Precautions / Restrictions Precautions Precautions: Fall;Knee Precaution Comments: no flexion Required Braces or Orthoses: Splint/Cast Splint/Cast: R LE Restrictions Weight Bearing  Restrictions: Yes RLE Weight Bearing: Non weight bearing       Mobility Bed Mobility Overal bed mobility: Needs Assistance Bed Mobility: Supine to Sit;Sit to Supine     Supine to sit: Min guard;HOB elevated Sit to supine: Min assist   General bed mobility comments: Min guard to transfer to side of bed. barest of assistance for return to supine - with slight assistance for RLE to bring up on bed.    Transfers Overall transfer level: Needs assistance Equipment used: Rolling walker (2 wheeled) Transfers: Sit to/from [facility] Sit to Stand: Min guard Stand pivot transfers: Min guard       General transfer comment: Demonstrates ability to stand with RW and pivot to [facility]. Does keep foot on the ground lightly for balance - but doesn't put any weight. Stood in standing x 10 minutes.    Balance Overall balance assessment: Mild deficits observed, not formally tested                                         ADL either performed or assessed with clinical judgement   ADL                                               Vision Patient Visual Report: No change from baseline     Perception     Praxis  Cognition Arousal/Alertness: Awake/alert Behavior During Therapy: WFL for tasks assessed/performed Overall Cognitive Status: Within Functional Limits for tasks assessed                                          Exercises     Shoulder Instructions       General Comments      Pertinent Vitals/ Pain       Pain Assessment: Faces Faces Pain Scale: Hurts little more Pain Location: R knee Pain Descriptors / Indicators: Grimacing;Discomfort;Pressure;Tightness Pain Intervention(s): Limited activity within patient's tolerance;Monitored during session  Home Living                                          Prior Functioning/Environment              Frequency  Min 2X/week         Progress Toward Goals  OT Goals(current goals can now be found in the care plan section)  Progress towards OT goals: Progressing toward goals  Acute Rehab OT Goals Patient Stated Goal: for knee to recover well OT Goal Formulation: With patient Time For Goal Achievement: 03/13/21 Potential to Achieve Goals: Good  Plan Discharge plan remains appropriate    Co-evaluation                 AM-PAC OT "6 Clicks" Daily Activity     Outcome Measure   Help from another person eating meals?: None Help from another person taking care of personal grooming?: A Little Help from another person toileting, which includes using toliet, bedpan, or urinal?: A Little Help from another person bathing (including washing, rinsing, drying)?: A Lot Help from another person to put on and taking off regular upper body clothing?: A Little Help from another person to put on and taking off regular lower body clothing?: A Lot 6 Click Score: 17    End of Session Equipment Utilized During Treatment: Rolling walker  OT Visit Diagnosis: Pain;Unsteadiness on feet (R26.81);Other abnormalities of gait and mobility (R26.89) Pain - Right/Left: Right Pain - part of body: Leg;Knee   Activity Tolerance Patient tolerated treatment well   Patient Left in bed;with call bell/phone within reach;with bed alarm set   Nurse Communication Mobility status        Time: 4431-5400 OT Time Calculation (min): 50 min  Charges: OT General Charges $OT Visit: 1 Visit OT Treatments $Therapeutic Activity: 23-37 mins (in room 50 minutes but a lot of time patient talking)  Derl Barrow, OTR/L [place]  Office [phone] Pager: [place] 02/28/2021, 5:09 PM

## 2021-02-28 NOTE — Progress Notes (Signed)
Subjective: Patient reports pain as moderate to severe. Declines narcotics from fear of severe constipation even though I explained to him that we have laxatives we could also give him. Tolerating diet. Urinating. No BM yet. No CP, SOB. Claims he has mobilized OOB on his own 2 times to walk to the bathroom. PT/OT notes appear he has declined their sessions with him due to his pain level several times. Obsessive about talking of numerous tangential topics particularly his experience at a previous SNF and his PMH. Often forgets what he even started talking about. Convinced that if this operation fails again then it is his "last chance to save my leg" and therefore wants to micro-manage his care. Fears that most of the healthcare personnel he encounters do not know how to properly take care of him or someone with his injury and thus will hurt him or neglect him. Says his upper body is weak and that he has a bad right shoulder and bilateral elbow pain making it difficult to ambulate with a walker. When I told him Pt/OT can also work on his upper body, he shot that down too and said it likely wouldn't help.   I explained in EXTENSIVE detail that we have offered him a variety of pain medicines, constipation aids, PT/OT, a list of several different SNFs to choose from so he doesn't have to return to the previous one and many other things to try and help him get better and progress. I explained that he is getting the same regimen we always use for a patient with this kind of injury and that it is not abnormal for them to d/c to a SNF or home on POD 2. He feels it is too soon to d/c him because his pain isn't under control yet. I explained this won't be a pain free recovery. He is very anxious and was adamant about not leaving the hospital yet. He continues to give reasons as to why he should stay. He would barely stop talking to allow me to even leave the room.    Objective:   VITALS:   Vitals:   02/27/21  1846 02/27/21 2212 02/28/21 0625 02/28/21 1316  BP: (!) 143/85 132/71 129/67 123/65  Pulse: 69 72 62 62  Resp: 19 19 16 18   Temp: 98.5 F (36.9 C) 98.2 F (36.8 C) 97.6 F (36.4 C)   TempSrc: Oral Oral    SpO2: 96% 95% 93% 95%  Weight:       CBC Latest Ref Rng & Units 02/26/2021 02/06/2021 08/07/2020  WBC 4.0 - 10.5 K/uL 7.7 6.5 8.2  Hemoglobin 13.0 - 17.0 g/dL 15.1 15.5 12.4(L)  Hematocrit 39.0 - 52.0 % 42.6 45.4 36.4(L)  Platelets 150 - 400 K/uL 158 145(L) 176   BMP Latest Ref Rng & Units 02/26/2021 02/06/2021 11/20/2020  Glucose 70 - 99 mg/dL 141(H) 107(H) -  BUN 8 - 23 mg/dL 24(H) 19 -  Creatinine 0.61 - 1.24 mg/dL 1.02 1.06 -  BUN/Creat Ratio 10 - 24 - - -  Sodium 135 - 145 mmol/L 140 141 140  Potassium 3.5 - 5.1 mmol/L 3.6 4.5 4.7  Chloride 98 - 111 mmol/L 109 108 102  CO2 22 - 32 mmol/L 22 25 25   Calcium 8.9 - 10.3 mg/dL 9.4 9.3 -   Intake/Output      05/04 0701 05/05 0700 05/05 0701 05/06 0700   P.O. 800    I.V. (mL/kg)     IV Piggyback  Total Intake(mL/kg) 800 (7.1)    Urine (mL/kg/hr) 2840 (1) 525 (0.6)   Stool 0    Blood     Total Output 2840 525   Net -2040 -525        Urine Occurrence 2 x    Stool Occurrence 1 x       Physical Exam: General: NAD. Laying in bed awake. Comfortable Resp: No increased wob Cardio: regular rate and rhythm ABD soft Neurologically intact MSK Neurovascularly intact Sensation intact distally Intact pulses distally Dorsiflexion/Plantar flexion intact Incision: dressing C/D/I   Assessment: 2 Days Post-Op  S/P Procedure(s) (LRB): REPAIR QUADRICEP TENDON (Right) by Dr. Ernesta Amble. Murphy on 02/26/21  Active Problems:   Rupture of quadriceps tendon, right, sequela   Plan: After I personally spoke with the patient in person for over an hour this morning and Dr. Percell Miller also spoke with the patient over the phone, it was agreed that the patient will spend another night in the hospital and be reassessed in the morning.   Advance diet Up with therapy. He REALLY needs to work on getting up out of bed rather than laying there all day long and not moving. PT/OT need to help him learn to mobilize with the walker and perform transfers. Has bad R shoulder and B/L elbow problems so strengthen the upper body is also needed.  Incentive Spirometry Elevate and Apply ice  Weightbearing: NWB RLE Insicional and dressing care: Dressings left intact until follow-up and Reinforce dressings as needed Orthopedic device(s): Splint Showering: Keep dressing dry VTE prophylaxis: Lovenox 40mg  qd while inpatient, will likely switch to ASA 81mg  bid upon d/c, SCDs, ambulation Pain control: Continue to offer the current regimen Follow - up plan: 2 weeks after discharge Contact information:  Edmonia Lynch MD, Aggie Moats PA-C  Dispo: Skilled Nursing Facility/Rehab hopefully tomorrow. Has a bed at [facility] that they will hold for him through tomorrow only. Patient aware of this.    Anticipated LOS equal to or greater than 2 midnights due to - Age 22 and older with one or more of the following:  - Obesity  - Expected need for hospital services (PT, OT, Nursing) required for safe  discharge  - Anticipated need for postoperative skilled nursing care or inpatient rehab  - Active co-morbidities: None OR   - Unanticipated findings during/Post Surgery: None  - Patient is a high risk of re-admission due to: Barriers to post-acute care (logistical, no family support in home)    Alisa Graff Office [phone] 02/28/2021, 2:22 PM

## 2021-02-28 NOTE — Op Note (Addendum)
02/26/2021  1:31 PM  PATIENT:  Kristopher Gay    PRE-OPERATIVE DIAGNOSIS:  RIGHT QUAD TENDON RUPTURE  POST-OPERATIVE DIAGNOSIS:  Same  PROCEDURE:  REPAIR QUADRICEP TENDON  SURGEON:  Renette Butters, MD  ASSISTANT: Aggie Moats, PA-C, he was present and scrubbed throughout the case, critical for completion in a timely fashion, and for retraction, instrumentation, and closure.   ANESTHESIA:   gen  PREOPERATIVE INDICATIONS:  Kristopher Gay is a  70 y.o. male with a diagnosis of RIGHT QUAD TENDON RUPTURE who failed conservative measures and elected for surgical management.    The risks benefits and alternatives were discussed with the patient preoperatively including but not limited to the risks of infection, bleeding, nerve injury, cardiopulmonary complications, the need for revision surgery, among others, and the patient was willing to proceed.  OPERATIVE IMPLANTS: arthrex anchors and Dermal graft with HS graft  OPERATIVE FINDINGS: quad retraction  BLOOD LOSS: min  COMPLICATIONS: none  TOURNIQUET TIME: 67min  OPERATIVE PROCEDURE:  Patient was identified in the preoperative holding area and site was marked by me He was transported to the operating theater and placed on the table in supine position taking care to pad all bony prominences. After a preincinduction time out anesthesia was induced. The right extremity was prepped and draped in normal sterile fashion and a pre-incision timeout was performed. He received ancef for preoperative antibiotics.   I made a longitudinal incision through his previous surgical incision and extended this distally as well as proximally.  I dissected down to his quadriceps tendon identified it scar developed scar tissue but significant retraction of about 4 cm from his patella tendon.  I excised some scar tissue and identified the tendon stump.  I removed previously placed hardware and stitches  I then mobilized and elevated his any adhesions to his  quad muscle.  Next I whipstitched up and back with 2 fiber tapes into his quad tendon I then secured these into 2 swivel lock anchors into the superior patella after protect preparing the bone for good healing.  Next I placed a hamstring tendon woven into the distal quad tendon and brought this down around the top of the patella and placed it in a swivel lock anchor into the tibial tubercle under tension to help back this up.  I irrigated his joint prior to closing  Next I placed a FiberWire stitch from each anchor to repair the medial collateral capsule  I then used the other FiberWire to repair the lateral collateral capsule of the knee.  Next I placed a dermal graft over top of the quad tendon at the repair site.  I then irrigated and closed the skin in layers.  POST OPERATIVE PLAN: Nonweightbearing splint full-time chemical DVT prophylaxis

## 2021-02-28 NOTE — TOC Progression Note (Signed)
Transition of Care [facility]) - Progression Note   Patient Details  Name: Kristopher Gay MRN: [id number] Date of Birth: [birth year]  Transition of Care [facility]) CM/SW [place], LCSW Phone Number: 02/28/2021, 2:51 PM  Clinical Narrative: Patient has received several bed offers. CSW met with patient to review list. Patient agreeable to [facility], although patient had a difficult time focusing on making a decision due to complaints regarding his knee pain. CSW spoke with Whitney at [facility] and the patient can come today pending a discharge summary. Patient will go to room 7013. Patient's COVID test is negative.  CSW called Bernadene Bell to complete the insurance authorization. Patient has been approved for 5 days with a start date of 02/28/21 and the next review date is 03/04/21. Clinicals for continued stay will be faxed to [facility] at [phone]. Plan auth ID to be provided at the next review date.  Per PA, patient will now stay an extra night. Whitney confirmed patient's bed will be held through tomorrow. TOC to follow.  Expected Discharge Plan: [place] Barriers to Discharge: Continued Medical Work up  Expected Discharge Plan and Services Expected Discharge Plan: [place] In-house Referral: Clinical Social Work Post Acute Care Choice: [place] Living arrangements for the past 2 months: Single Family Home              DME Arranged: N/A DME Agency: NA  Readmission Risk Interventions No flowsheet data found.

## 2021-03-01 DIAGNOSIS — I10 Essential (primary) hypertension: Secondary | ICD-10-CM | POA: Diagnosis present

## 2021-03-01 DIAGNOSIS — J9811 Atelectasis: Secondary | ICD-10-CM | POA: Diagnosis present

## 2021-03-01 DIAGNOSIS — G8929 Other chronic pain: Secondary | ICD-10-CM | POA: Diagnosis not present

## 2021-03-01 DIAGNOSIS — I4891 Unspecified atrial fibrillation: Secondary | ICD-10-CM | POA: Diagnosis not present

## 2021-03-01 DIAGNOSIS — Z7901 Long term (current) use of anticoagulants: Secondary | ICD-10-CM | POA: Diagnosis not present

## 2021-03-01 DIAGNOSIS — M1990 Unspecified osteoarthritis, unspecified site: Secondary | ICD-10-CM | POA: Diagnosis not present

## 2021-03-01 DIAGNOSIS — Z803 Family history of malignant neoplasm of breast: Secondary | ICD-10-CM | POA: Diagnosis not present

## 2021-03-01 DIAGNOSIS — M25561 Pain in right knee: Secondary | ICD-10-CM | POA: Diagnosis present

## 2021-03-01 DIAGNOSIS — R42 Dizziness and giddiness: Secondary | ICD-10-CM | POA: Diagnosis not present

## 2021-03-01 DIAGNOSIS — Z888 Allergy status to other drugs, medicaments and biological substances status: Secondary | ICD-10-CM | POA: Diagnosis not present

## 2021-03-01 DIAGNOSIS — I517 Cardiomegaly: Secondary | ICD-10-CM | POA: Diagnosis not present

## 2021-03-01 DIAGNOSIS — R52 Pain, unspecified: Secondary | ICD-10-CM | POA: Diagnosis not present

## 2021-03-01 DIAGNOSIS — Z9889 Other specified postprocedural states: Secondary | ICD-10-CM | POA: Diagnosis not present

## 2021-03-01 DIAGNOSIS — R279 Unspecified lack of coordination: Secondary | ICD-10-CM | POA: Diagnosis not present

## 2021-03-01 DIAGNOSIS — E785 Hyperlipidemia, unspecified: Secondary | ICD-10-CM | POA: Diagnosis present

## 2021-03-01 DIAGNOSIS — R600 Localized edema: Secondary | ICD-10-CM | POA: Diagnosis not present

## 2021-03-01 DIAGNOSIS — R0789 Other chest pain: Secondary | ICD-10-CM | POA: Diagnosis not present

## 2021-03-01 DIAGNOSIS — Z79899 Other long term (current) drug therapy: Secondary | ICD-10-CM | POA: Diagnosis not present

## 2021-03-01 DIAGNOSIS — G629 Polyneuropathy, unspecified: Secondary | ICD-10-CM | POA: Diagnosis not present

## 2021-03-01 DIAGNOSIS — Z800 Family history of malignant neoplasm of digestive organs: Secondary | ICD-10-CM | POA: Diagnosis not present

## 2021-03-01 DIAGNOSIS — E860 Dehydration: Secondary | ICD-10-CM | POA: Diagnosis not present

## 2021-03-01 DIAGNOSIS — I739 Peripheral vascular disease, unspecified: Secondary | ICD-10-CM | POA: Diagnosis not present

## 2021-03-01 DIAGNOSIS — Z7409 Other reduced mobility: Secondary | ICD-10-CM | POA: Diagnosis not present

## 2021-03-01 DIAGNOSIS — I480 Paroxysmal atrial fibrillation: Secondary | ICD-10-CM | POA: Diagnosis present

## 2021-03-01 DIAGNOSIS — R0902 Hypoxemia: Secondary | ICD-10-CM | POA: Diagnosis not present

## 2021-03-01 DIAGNOSIS — J449 Chronic obstructive pulmonary disease, unspecified: Secondary | ICD-10-CM | POA: Diagnosis not present

## 2021-03-01 DIAGNOSIS — Z20822 Contact with and (suspected) exposure to covid-19: Secondary | ICD-10-CM | POA: Diagnosis present

## 2021-03-01 DIAGNOSIS — Z820 Family history of epilepsy and other diseases of the nervous system: Secondary | ICD-10-CM | POA: Diagnosis not present

## 2021-03-01 DIAGNOSIS — Z91030 Bee allergy status: Secondary | ICD-10-CM | POA: Diagnosis not present

## 2021-03-01 DIAGNOSIS — R918 Other nonspecific abnormal finding of lung field: Secondary | ICD-10-CM | POA: Diagnosis present

## 2021-03-01 DIAGNOSIS — S76111D Strain of right quadriceps muscle, fascia and tendon, subsequent encounter: Secondary | ICD-10-CM | POA: Diagnosis not present

## 2021-03-01 DIAGNOSIS — J439 Emphysema, unspecified: Secondary | ICD-10-CM | POA: Diagnosis present

## 2021-03-01 DIAGNOSIS — R5381 Other malaise: Secondary | ICD-10-CM | POA: Diagnosis not present

## 2021-03-01 DIAGNOSIS — R509 Fever, unspecified: Secondary | ICD-10-CM | POA: Diagnosis not present

## 2021-03-01 DIAGNOSIS — Z743 Need for continuous supervision: Secondary | ICD-10-CM | POA: Diagnosis not present

## 2021-03-01 DIAGNOSIS — R443 Hallucinations, unspecified: Secondary | ICD-10-CM | POA: Diagnosis present

## 2021-03-01 DIAGNOSIS — R519 Headache, unspecified: Secondary | ICD-10-CM | POA: Diagnosis not present

## 2021-03-01 MED ORDER — ACETAMINOPHEN 500 MG PO TABS: 1000.0000 mg | ORAL_TABLET | Freq: Four times a day (QID) | ORAL | 0 refills | Status: AC | PRN

## 2021-03-01 MED ORDER — TRAMADOL HCL 50 MG PO TABS: 100.0000 mg | ORAL_TABLET | Freq: Four times a day (QID) | ORAL | 0 refills | Status: DC | PRN

## 2021-03-01 MED ORDER — METHOCARBAMOL 500 MG PO TABS: 500.0000 mg | ORAL_TABLET | Freq: Three times a day (TID) | ORAL | 0 refills | Status: DC | PRN

## 2021-03-01 MED ORDER — DOCUSATE SODIUM 100 MG PO CAPS: 100.0000 mg | ORAL_CAPSULE | Freq: Two times a day (BID) | ORAL | 0 refills | Status: DC | PRN

## 2021-03-01 MED ORDER — RIVAROXABAN 15 MG PO TABS: 15.0000 mg | ORAL_TABLET | Freq: Two times a day (BID) | ORAL | 0 refills | Status: DC

## 2021-03-01 NOTE — Progress Notes (Signed)
Subjective: Patient reports pain as moderate to severe. 8/10. Still declining narcotics for pain control due to fear of constipation. Had a BM yesterday. Tolerating diet. Urinating. No CP, SOB. Has worked some with PT/OT on mobilization OOB. Says leg is swollen and splint is uncomfortable between when trying to move the entire right leg. Continues to try to dictate his care and tell staff how to do their job which causes some tension between him and the staff. Still very fearful of going to a SNF due to his bad experience in the past but I think I've convinced him to go to [place] today.   Objective:   VITALS:   Vitals:   02/28/21 1316 02/28/21 2111 03/01/21 0530 03/01/21 0831  BP: 123/65 (!) 149/82 140/75   Pulse: 62 69 62   Resp: 18 19 18    Temp:  98.4 F (36.9 C) (!) 97.5 F (36.4 C)   TempSrc:  Oral Oral   SpO2: 95% 93% 93% 93%  Weight:       CBC Latest Ref Rng & Units 02/26/2021 02/06/2021 08/07/2020  WBC 4.0 - 10.5 K/uL 7.7 6.5 8.2  Hemoglobin 13.0 - 17.0 g/dL 15.1 15.5 12.4(L)  Hematocrit 39.0 - 52.0 % 42.6 45.4 36.4(L)  Platelets 150 - 400 K/uL 158 145(L) 176   BMP Latest Ref Rng & Units 02/26/2021 02/06/2021 11/20/2020  Glucose 70 - 99 mg/dL 141(H) 107(H) -  BUN 8 - 23 mg/dL 24(H) 19 -  Creatinine 0.61 - 1.24 mg/dL 1.02 1.06 -  BUN/Creat Ratio 10 - 24 - - -  Sodium 135 - 145 mmol/L 140 141 140  Potassium 3.5 - 5.1 mmol/L 3.6 4.5 4.7  Chloride 98 - 111 mmol/L 109 108 102  CO2 22 - 32 mmol/L 22 25 25   Calcium 8.9 - 10.3 mg/dL 9.4 9.3 -   Intake/Output      05/05 0701 05/06 0700 05/06 0701 05/07 0700   P.O. 480    Total Intake(mL/kg) 480 (4.2)    Urine (mL/kg/hr) 1325 (0.5)    Stool     Total Output 1325    Net -845            Physical Exam: General: NAD. Asleep in bed. Easily awoken.  Resp: No increased wob Cardio: regular rate and rhythm ABD soft Neurologically intact MSK Neurovascularly intact Sensation intact distally Intact pulses  distally Dorsiflexion/Plantar flexion intact Incision: dressing C/D/I   Assessment: 3 Days Post-Op  S/P Procedure(s) (LRB): REPAIR QUADRICEP TENDON (Right) by Dr. Ernesta Amble. Percell Miller on 02/26/21  Active Problems:   Rupture of quadriceps tendon, right, sequela   Plan:  Up with therapy Incentive Spirometry Elevate and Apply ice  Weightbearing: NWB RLE Insicional and dressing care: Dressings left intact until follow-up and Reinforce dressings as needed Orthopedic device(s): Splint for at least 2 weeks Showering: Keep dressing dry VTE prophylaxis: Lovenox 40mg  qd while inpatient. Will switch to ASA 81mg  bid upon d/c, SCDs, ambulation Pain control: Continue to offer current regimen. Seems he is willing to take Tramadol, Tylenol, and Robaxin.  Follow - up plan: 2 weeks Contact information:  Edmonia Lynch MD, Aggie Moats PA-C  Dispo: Skilled Nursing Facility/Rehab hopefully later today. Waiting on Dr. Percell Miller to call and speak with patient to confirm he is willing to go.    Anticipated LOS equal to or greater than 2 midnights due to - Age 70 and older with one or more of the following:  - Obesity  - Expected need for  hospital services (PT, OT, Nursing) required for safe  discharge  - Anticipated need for postoperative skilled nursing care or inpatient rehab  - Active co-morbidities: None OR   - Unanticipated findings during/Post Surgery: None  - Patient is a high risk of re-admission due to: Barriers to post-acute care (logistical, no family support in home)   Alisa Graff Office [phone] 03/01/2021, 8:34 AM

## 2021-03-01 NOTE — Discharge Summary (Signed)
Physician Discharge Summary  Patient ID: Kristopher Gay MRN: [id number] DOB/AGE: [birth year] 70 y.o.  Admit date: 02/26/2021 Discharge date: 03/01/2021  Admission Diagnoses: Recurrent rupture right quadriceps tendon  Discharge Diagnoses:  Active Problems:   Rupture of quadriceps tendon, right, sequela   Discharged Condition: fair  Hospital Course: Patient had surgery on 02/26/21 by Dr. Percell Miller to repair a recurrent tear in his right Quadriceps tendon. Surgery went well and the patient spent 2 nights recovering in the hospital. The only difficulties have been with pain control as he is fearful of taking narcotics due to a history of severe constipation from narcotic use. We have found a medicine regimen that he agrees with and is working. He is ready to be discharged to a SNF for further recovery.   Consults: None  Treatments: IV hydration, antibiotics: Ancef, analgesia: acetaminophen and Tramadol, anticoagulation: Lovenox and surgery: right quadriceps tendon repair  Discharge Exam: Blood pressure 140/75, pulse 62, temperature (!) 97.5 F (36.4 C), temperature source Oral, resp. rate 18, weight 113.4 kg, SpO2 93 %. General appearance: alert and no distress Head: Normocephalic, without obvious abnormality, atraumatic Eyes: conjunctivae/corneas clear. PERRL, EOM's intact. Fundi benign. Resp: clear to auscultation bilaterally Cardio: regular rate and rhythm, S1, S2 normal, no murmur, click, rub or gallop GI: soft, non-tender; bowel sounds normal; no masses,  no organomegaly Extremities: extremities normal, atraumatic, no cyanosis or edema Pulses: 2+ and symmetric Neurologic: Alert and oriented X 3, normal strength and tone. Normal symmetric reflexes. Normal coordination and gait Incision/Wound: c/d/i, leg in 3 sided splint overwrapped with ACE bandage  Disposition: Discharge disposition: 03-Skilled [place]       Discharge Instructions    Call MD / Call 911   Complete by: As  directed    If you experience chest pain or shortness of breath, CALL 911 and be transported to the hospital emergency room.  If you develope a fever above 101 F, pus (white drainage) or increased drainage or redness at the wound, or calf pain, call your surgeon's office.   Diet - low sodium heart healthy   Complete by: As directed    Discharge instructions   Complete by: As directed    Elevate leg and apply ice to reduce pain and swelling.  Maintain 3 sided splint on RLE at all times until follow up appointment.   Diet: As you were doing prior to hospitalization   Dressing:  Keep dressings on and dry until follow up.  Showering: Must keep splint and dressings dry. You could put the leg in a trash bag. It may be easier to do sponge baths. Do not submerge the leg in water such as a bath, pool, or hot tub.   Activity:  Increase activity slowly as tolerated, but follow the weight bearing instructions below. We do not want you to lay in bed all day not moving. You should get up and move around often throughout the day using your walker. This will help to reduce the risk of blood clots.   Driving: not allowed right now  Weight Bearing:   Non-weight bearing RLE.  Medicines: - Xarelto/Rivaroxaban is to prevent blood clots after surgery. You MUST take this medicine for 30 days.  - Tylenol is for mild to moderate pain relief. Take 1,000mg  every 6 hours as needed for pain.  - Tramadol is for severe pain relief. Take 100mg  every 6 hours as needed for pain.  - Robaxin is for muscle spasms. Take 500mg  every 8 hours as  needed. - Colace is to prevent constipation associated with pain reliever use. Take this as needed.   To prevent constipation: you may use a stool softener such as -  Colace (over the counter) 100 mg by mouth twice a day  Drink plenty of fluids (prune juice may be helpful) and high fiber foods Miralax (over the counter) for constipation as needed.    Itching:  If you experience  itching with your medications, try taking only a single pain pill, or even half a pain pill at a time.  You can also use benadryl over the counter for itching or also to help with sleep.   Precautions:  If you experience chest pain or shortness of breath - call 911 immediately for transfer to the hospital emergency department!!  If you develop a fever greater that 101 F, purulent drainage from wound, increased redness or drainage from wound, or calf pain -- Call the office at [phone]                                                 Follow- Up Appointment:  You are scheduled for a post-op follow up in the office with Dr. Percell Miller on 03/08/21 at 11:15am - [phone]   Driving restrictions   Complete by: As directed    No driving for 6 weeks   Post-operative opioid taper instructions:   Complete by: As directed    POST-OPERATIVE OPIOID TAPER INSTRUCTIONS: It is important to wean off of your opioid medication as soon as possible. If you do not need pain medication after your surgery it is ok to stop day one. Opioids include: Codeine, Hydrocodone(Norco, Vicodin), Oxycodone(Percocet, oxycontin) and hydromorphone amongst others.  Long term and even short term use of opiods can cause: Increased pain response Dependence Constipation Depression Respiratory depression And more.  Withdrawal symptoms can include Flu like symptoms Nausea, vomiting And more Techniques to manage these symptoms Hydrate well Eat regular healthy meals Stay active Use relaxation techniques(deep breathing, meditating, yoga) Do Not substitute Alcohol to help with tapering If you have been on opioids for less than two weeks and do not have pain than it is ok to stop all together.  Plan to wean off of opioids This plan should start within one week post op of your joint replacement. Maintain the same interval or time between taking each dose and first decrease the dose.  Cut the total daily intake of opioids by one  tablet each day Next start to increase the time between doses. The last dose that should be eliminated is the evening dose.        Allergies as of 03/01/2021      Reactions   Bee Venom Swelling   Linzess [linaclotide] Other (See Comments)   Abdominal pain   Molds & Smuts Other (See Comments)   Unknown   Seasonal Ic [cholestatin] Other (See Comments)   Environmental Allergies      Medication List    STOP taking these medications   aspirin EC 81 MG tablet     TAKE these medications   acetaminophen 500 MG tablet Commonly known as: TYLENOL Take 2 tablets (1,000 mg total) by mouth every 6 (six) hours as needed for mild pain or moderate pain.   ascorbic acid 1000 MG tablet Commonly known as: VITAMIN C Take 2,000 mg by mouth daily.  atorvastatin 40 MG tablet Commonly known as: LIPITOR Take 40 mg by mouth daily.   Azelastine-Fluticasone 137-50 MCG/ACT Susp Place 1 spray into the nose as needed. What changed: when to take this   CALCIUM 1200 PO Take 1 tablet by mouth in the morning and at bedtime.   celecoxib 100 MG capsule Commonly known as: CELEBREX Take 100 mg by mouth in the morning and at bedtime.   cyanocobalamin 1000 MCG tablet Take 1,000 mcg by mouth in the morning and at bedtime.   DHEA 25 MG Caps Take 25 mg by mouth daily.   docusate sodium 100 MG capsule Commonly known as: COLACE Take 1 capsule (100 mg total) by mouth 2 (two) times daily as needed for mild constipation.   EPINEPHrine 0.3 mg/0.3 mL Soaj injection Commonly known as: EPI-PEN Inject 0.3 mg into the muscle as needed for anaphylaxis.   Fish Oil 1200 MG Caps Take 1,200 mg by mouth in the morning and at bedtime.   furosemide 20 MG tablet Commonly known as: LASIX TAKE 1 TABLET(20 MG) BY MOUTH DAILY What changed:   how much to take  how to take this  when to take this  additional instructions   gabapentin 300 MG capsule Commonly known as: NEURONTIN Take 2 capsule in the morning.  Take 1 capsule Mid-day and 2 capsule at bedtime. What changed:   how much to take  how to take this  when to take this  additional instructions   GLUCOSAMINE-MSM PO Take 1 tablet by mouth in the morning and at bedtime.   Magnesium 250 MG Tabs Take 250 mg by mouth daily.   methocarbamol 500 MG tablet Commonly known as: Robaxin Take 1 tablet (500 mg total) by mouth every 8 (eight) hours as needed for muscle spasms.   metoprolol succinate 25 MG 24 hr tablet Commonly known as: TOPROL-XL TAKE 1 TABLET(25 MG) BY MOUTH DAILY What changed: See the new instructions.   montelukast 10 MG tablet Commonly known as: SINGULAIR Take 1 tablet (10 mg total) by mouth daily.   multivitamin tablet Take 1 tablet by mouth 2 (two) times daily.   nitroGLYCERIN 0.4 MG SL tablet Commonly known as: NITROSTAT Place 0.4 mg under the tongue every 5 (five) minutes as needed for chest pain.   Rivaroxaban 15 MG Tabs tablet Commonly known as: XARELTO Take 1 tablet (15 mg total) by mouth 2 (two) times daily with a meal. To prevent blood clots after surgery.   traMADol 50 MG tablet Commonly known as: Ultram Take 2 tablets (100 mg total) by mouth every 6 (six) hours as needed for severe pain. What changed:   how much to take  when to take this  reasons to take this   Trelegy Ellipta 100-62.5-25 MCG/INH Aepb Generic drug: Fluticasone-Umeclidin-Vilant INHALE 1 PUFF INTO THE LUNGS EVERY DAY What changed:   how much to take  how to take this  when to take this  additional instructions   Vitamin D3 50 MCG (2000 UT) capsule Take 2,000 Units by mouth in the morning and at bedtime.   vitamin E 180 MG (400 UNITS) capsule Take 400 Units by mouth in the morning and at bedtime.       Contact information for follow-up providers    Renette Butters, MD. Go on 03/08/2021.   Specialty: Orthopedic Surgery Why: For suture removal, For wound re-check Contact information: [address] [place] [address] [place] [id number] [phone]  Contact information for after-discharge care    Destination    [facility] .   Service: Skilled Nursing Contact information: [address] [place] [phone]                  Signed: Alisa Graff 03/01/2021, 12:38 PM

## 2021-03-01 NOTE — Plan of Care (Signed)
  Problem: Activity: Goal: Risk for activity intolerance will decrease Outcome: Progressing   Problem: Pain Managment: Goal: General experience of comfort will improve Outcome: Progressing   

## 2021-03-01 NOTE — Progress Notes (Signed)
Attempted to call report, left voice mail on charge nurse phone.

## 2021-03-01 NOTE — TOC Transition Note (Signed)
Transition of Care [facility]) - CM/SW Discharge Note  Patient Details  Name: Kristopher Gay MRN: [id number] Date of Birth: 01-Sep-1951  [facility] CM/SW Contact:  Sherie Don, LCSW Phone Number: 03/01/2021, 1:06 PM   Clinical Narrative: Patient to discharge to [facility] today for SNF. Patient will go to room 7011 and the number for report is [phone]. Discharge summary, discharge orders, and SNF transfer report faxed to facility in hub. Medical necessity form done; PTAR scheduled. Discharge packet complete. RN updated. TOC signing off.  Final next level of care: Skilled Nursing Facility Barriers to Discharge: Barriers Resolved  Patient Goals and CMS Choice Patient states their goals for this hospitalization and ongoing recovery are:: Go to rehab CMS Medicare.gov Compare Post Acute Care list provided to:: Patient Choice offered to / list presented to : Patient  Discharge Placement Existing PASRR number confirmed : 02/27/21          Patient chooses bed at: [facility] Patient to be transferred to facility by: PTAR Patient and family notified of of transfer: 03/01/21  Discharge Plan and Services In-house Referral: Clinical Social Work Post Acute Care Choice: [place]          DME Arranged: N/A DME Agency: NA  Readmission Risk Interventions No flowsheet data found.

## 2021-03-04 DIAGNOSIS — Z7409 Other reduced mobility: Secondary | ICD-10-CM | POA: Diagnosis not present

## 2021-03-04 DIAGNOSIS — I10 Essential (primary) hypertension: Secondary | ICD-10-CM | POA: Diagnosis not present

## 2021-03-04 DIAGNOSIS — E785 Hyperlipidemia, unspecified: Secondary | ICD-10-CM | POA: Diagnosis not present

## 2021-03-04 DIAGNOSIS — S76111D Strain of right quadriceps muscle, fascia and tendon, subsequent encounter: Secondary | ICD-10-CM | POA: Diagnosis not present

## 2021-03-04 DIAGNOSIS — J449 Chronic obstructive pulmonary disease, unspecified: Secondary | ICD-10-CM | POA: Diagnosis not present

## 2021-03-05 DIAGNOSIS — R52 Pain, unspecified: Secondary | ICD-10-CM | POA: Diagnosis not present

## 2021-03-05 DIAGNOSIS — R42 Dizziness and giddiness: Secondary | ICD-10-CM | POA: Diagnosis not present

## 2021-03-08 ENCOUNTER — Emergency Department (HOSPITAL_COMMUNITY): Payer: Medicare PPO

## 2021-03-08 ENCOUNTER — Inpatient Hospital Stay (HOSPITAL_COMMUNITY)
Admission: EM | Admit: 2021-03-08 | Discharge: 2021-03-12 | DRG: 556 | Disposition: A | Discharge status: Skilled Nursing Facility | Payer: Medicare PPO | Source: Skilled Nursing Facility | Attending: Family Medicine | Admitting: Family Medicine

## 2021-03-08 ENCOUNTER — Other Ambulatory Visit: Payer: Self-pay

## 2021-03-08 ENCOUNTER — Encounter (HOSPITAL_COMMUNITY): Payer: Self-pay | Admitting: Emergency Medicine

## 2021-03-08 DIAGNOSIS — R443 Hallucinations, unspecified: Secondary | ICD-10-CM | POA: Diagnosis present

## 2021-03-08 DIAGNOSIS — R519 Headache, unspecified: Secondary | ICD-10-CM | POA: Diagnosis not present

## 2021-03-08 DIAGNOSIS — Z888 Allergy status to other drugs, medicaments and biological substances status: Secondary | ICD-10-CM | POA: Diagnosis not present

## 2021-03-08 DIAGNOSIS — M25561 Pain in right knee: Secondary | ICD-10-CM | POA: Diagnosis not present

## 2021-03-08 DIAGNOSIS — Z91030 Bee allergy status: Secondary | ICD-10-CM | POA: Diagnosis not present

## 2021-03-08 DIAGNOSIS — I4891 Unspecified atrial fibrillation: Secondary | ICD-10-CM | POA: Diagnosis not present

## 2021-03-08 DIAGNOSIS — R918 Other nonspecific abnormal finding of lung field: Secondary | ICD-10-CM | POA: Diagnosis present

## 2021-03-08 DIAGNOSIS — I10 Essential (primary) hypertension: Secondary | ICD-10-CM | POA: Diagnosis present

## 2021-03-08 DIAGNOSIS — G629 Polyneuropathy, unspecified: Secondary | ICD-10-CM | POA: Diagnosis not present

## 2021-03-08 DIAGNOSIS — Z79899 Other long term (current) drug therapy: Secondary | ICD-10-CM | POA: Diagnosis not present

## 2021-03-08 DIAGNOSIS — Z820 Family history of epilepsy and other diseases of the nervous system: Secondary | ICD-10-CM | POA: Diagnosis not present

## 2021-03-08 DIAGNOSIS — I739 Peripheral vascular disease, unspecified: Secondary | ICD-10-CM | POA: Diagnosis not present

## 2021-03-08 DIAGNOSIS — J9811 Atelectasis: Secondary | ICD-10-CM | POA: Diagnosis present

## 2021-03-08 DIAGNOSIS — S76111D Strain of right quadriceps muscle, fascia and tendon, subsequent encounter: Secondary | ICD-10-CM | POA: Diagnosis not present

## 2021-03-08 DIAGNOSIS — Z803 Family history of malignant neoplasm of breast: Secondary | ICD-10-CM | POA: Diagnosis not present

## 2021-03-08 DIAGNOSIS — E785 Hyperlipidemia, unspecified: Secondary | ICD-10-CM | POA: Diagnosis present

## 2021-03-08 DIAGNOSIS — R509 Fever, unspecified: Secondary | ICD-10-CM

## 2021-03-08 DIAGNOSIS — I517 Cardiomegaly: Secondary | ICD-10-CM | POA: Diagnosis not present

## 2021-03-08 DIAGNOSIS — Z800 Family history of malignant neoplasm of digestive organs: Secondary | ICD-10-CM

## 2021-03-08 DIAGNOSIS — I480 Paroxysmal atrial fibrillation: Secondary | ICD-10-CM | POA: Diagnosis present

## 2021-03-08 DIAGNOSIS — G8929 Other chronic pain: Secondary | ICD-10-CM | POA: Diagnosis not present

## 2021-03-08 DIAGNOSIS — Z9889 Other specified postprocedural states: Secondary | ICD-10-CM

## 2021-03-08 DIAGNOSIS — R911 Solitary pulmonary nodule: Secondary | ICD-10-CM

## 2021-03-08 DIAGNOSIS — J439 Emphysema, unspecified: Secondary | ICD-10-CM | POA: Diagnosis present

## 2021-03-08 DIAGNOSIS — M1990 Unspecified osteoarthritis, unspecified site: Secondary | ICD-10-CM | POA: Diagnosis not present

## 2021-03-08 DIAGNOSIS — R0789 Other chest pain: Secondary | ICD-10-CM | POA: Diagnosis not present

## 2021-03-08 DIAGNOSIS — R600 Localized edema: Secondary | ICD-10-CM | POA: Diagnosis not present

## 2021-03-08 DIAGNOSIS — Z20822 Contact with and (suspected) exposure to covid-19: Secondary | ICD-10-CM | POA: Diagnosis present

## 2021-03-08 DIAGNOSIS — E860 Dehydration: Secondary | ICD-10-CM | POA: Diagnosis not present

## 2021-03-08 DIAGNOSIS — Z7901 Long term (current) use of anticoagulants: Secondary | ICD-10-CM

## 2021-03-08 DIAGNOSIS — R0902 Hypoxemia: Secondary | ICD-10-CM | POA: Diagnosis not present

## 2021-03-08 LAB — BASIC METABOLIC PANEL
Anion gap: 6 (ref 5–15)
BUN: 19 mg/dL (ref 8–23)
CO2: 25 mmol/L (ref 22–32)
Calcium: 9.2 mg/dL (ref 8.9–10.3)
Chloride: 105 mmol/L (ref 98–111)
Creatinine, Ser: 0.83 mg/dL (ref 0.61–1.24)
GFR, Estimated: >60 mL/min (ref >60)
Glucose, Bld: 114 mg/dL — ABNORMAL HIGH (ref 70–99)
Potassium: 4.2 mmol/L (ref 3.5–5.1)
Sodium: 136 mmol/L (ref 135–145)

## 2021-03-08 LAB — CBC WITH DIFFERENTIAL/PLATELET
Abs Immature Granulocytes: 0.04 10*3/uL (ref 0.00–0.07)
Basophils Absolute: 0 10*3/uL (ref 0.0–0.1)
Basophils Relative: 1 %
Eosinophils Absolute: 0.1 10*3/uL (ref 0.0–0.5)
Eosinophils Relative: 1 %
HCT: 36.9 % — ABNORMAL LOW (ref 39.0–52.0)
Hemoglobin: 12.7 g/dL — ABNORMAL LOW (ref 13.0–17.0)
Immature Granulocytes: 1 %
Lymphocytes Relative: 11 %
Lymphs Abs: 0.9 10*3/uL (ref 0.7–4.0)
MCH: 32.1 pg (ref 26.0–34.0)
MCHC: 34.4 g/dL (ref 30.0–36.0)
MCV: 93.2 fL (ref 80.0–100.0)
Monocytes Absolute: 0.8 10*3/uL (ref 0.1–1.0)
Monocytes Relative: 9 %
Neutro Abs: 7 10*3/uL (ref 1.7–7.7)
Neutrophils Relative %: 77 %
Platelets: 237 10*3/uL (ref 150–400)
RBC: 3.96 MIL/uL — ABNORMAL LOW (ref 4.22–5.81)
RDW: 13.2 % (ref 11.5–15.5)
WBC: 8.8 10*3/uL (ref 4.0–10.5)
nRBC: 0 % (ref 0.0–0.2)

## 2021-03-08 LAB — URINALYSIS, ROUTINE W REFLEX MICROSCOPIC
Bilirubin Urine: NEGATIVE
Glucose, UA: NEGATIVE mg/dL
Hgb urine dipstick: NEGATIVE
Ketones, ur: NEGATIVE mg/dL
Leukocytes,Ua: NEGATIVE
Nitrite: NEGATIVE
Protein, ur: NEGATIVE mg/dL
Specific Gravity, Urine: 1.019 (ref 1.005–1.030)
pH: 7 (ref 5.0–8.0)

## 2021-03-08 LAB — SEDIMENTATION RATE: Sed Rate: 60 mm/hr — ABNORMAL HIGH (ref 0–16)

## 2021-03-08 LAB — C-REACTIVE PROTEIN: CRP: 8.4 mg/dL — ABNORMAL HIGH (ref <1.0)

## 2021-03-08 LAB — RESP PANEL BY RT-PCR (FLU A&B, COVID) ARPGX2
Influenza A by PCR: NEGATIVE
Influenza B by PCR: NEGATIVE
SARS Coronavirus 2 by RT PCR: NEGATIVE

## 2021-03-08 MED ORDER — FLUTICASONE FUROATE-VILANTEROL 100-25 MCG/INH IN AEPB
1.0000 | INHALATION_SPRAY | Freq: Every day | RESPIRATORY_TRACT | Status: DC
  Filled 2021-03-08: qty 28

## 2021-03-08 MED ORDER — ADULT MULTIVITAMIN W/MINERALS CH
1.0000 | ORAL_TABLET | Freq: Two times a day (BID) | ORAL | Status: DC
  Administered 2021-03-08 – 2021-03-12 (×9): 1 via ORAL
  Filled 2021-03-08 (×9): qty 1

## 2021-03-08 MED ORDER — TRAMADOL HCL 50 MG PO TABS: 100.0000 mg | ORAL_TABLET | Freq: Three times a day (TID) | ORAL | Status: DC | PRN

## 2021-03-08 MED ORDER — AZELASTINE HCL 0.1 % NA SOLN
1.0000 | Freq: Two times a day (BID) | NASAL | Status: DC
  Administered 2021-03-08: 1 via NASAL
  Filled 2021-03-08: qty 30

## 2021-03-08 MED ORDER — ONDANSETRON HCL 4 MG/2ML IJ SOLN: 4.0000 mg | Freq: Four times a day (QID) | INTRAMUSCULAR | Status: DC | PRN

## 2021-03-08 MED ORDER — METOPROLOL SUCCINATE ER 50 MG PO TB24
25.0000 mg | ORAL_TABLET | Freq: Every day | ORAL | Status: DC
  Administered 2021-03-08 – 2021-03-12 (×5): 25 mg via ORAL
  Filled 2021-03-08 (×5): qty 1

## 2021-03-08 MED ORDER — PIPERACILLIN-TAZOBACTAM 3.375 G IVPB 30 MIN
3.3750 g | Freq: Once | INTRAVENOUS | Status: AC
  Administered 2021-03-08: 3.375 g via INTRAVENOUS
  Filled 2021-03-08: qty 50

## 2021-03-08 MED ORDER — FUROSEMIDE 40 MG PO TABS
20.0000 mg | ORAL_TABLET | Freq: Every day | ORAL | Status: DC
  Administered 2021-03-08 – 2021-03-12 (×5): 20 mg via ORAL
  Filled 2021-03-08 (×5): qty 1

## 2021-03-08 MED ORDER — ACETAMINOPHEN 500 MG PO TABS: 1000.0000 mg | ORAL_TABLET | Freq: Three times a day (TID) | ORAL | Status: DC | PRN

## 2021-03-08 MED ORDER — AZELASTINE-FLUTICASONE 137-50 MCG/ACT NA SUSP: 1.0000 | Freq: Two times a day (BID) | NASAL | Status: DC

## 2021-03-08 MED ORDER — TRAMADOL HCL 50 MG PO TABS
100.0000 mg | ORAL_TABLET | Freq: Four times a day (QID) | ORAL | Status: DC | PRN
  Administered 2021-03-08 – 2021-03-12 (×14): 100 mg via ORAL
  Filled 2021-03-08 (×14): qty 2

## 2021-03-08 MED ORDER — GABAPENTIN 300 MG PO CAPS: 300.0000 mg | ORAL_CAPSULE | ORAL | Status: DC

## 2021-03-08 MED ORDER — METHOCARBAMOL 500 MG PO TABS
500.0000 mg | ORAL_TABLET | Freq: Once | ORAL | Status: AC
  Administered 2021-03-08: 500 mg via ORAL
  Filled 2021-03-08: qty 1

## 2021-03-08 MED ORDER — CALCIUM 1200 1200-1000 MG-UNIT PO CHEW: 1.0000 | CHEWABLE_TABLET | Freq: Two times a day (BID) | ORAL | Status: DC

## 2021-03-08 MED ORDER — GABAPENTIN 300 MG PO CAPS
600.0000 mg | ORAL_CAPSULE | Freq: Two times a day (BID) | ORAL | Status: DC
  Administered 2021-03-08 – 2021-03-12 (×9): 600 mg via ORAL
  Filled 2021-03-08 (×9): qty 2

## 2021-03-08 MED ORDER — UMECLIDINIUM BROMIDE 62.5 MCG/INH IN AEPB: 1.0000 | INHALATION_SPRAY | Freq: Every day | RESPIRATORY_TRACT | Status: DC

## 2021-03-08 MED ORDER — MONTELUKAST SODIUM 10 MG PO TABS
10.0000 mg | ORAL_TABLET | Freq: Every day | ORAL | Status: DC
  Administered 2021-03-08 – 2021-03-11 (×4): 10 mg via ORAL
  Filled 2021-03-08 (×4): qty 1

## 2021-03-08 MED ORDER — VANCOMYCIN HCL 2000 MG/400ML IV SOLN
2000.0000 mg | Freq: Once | INTRAVENOUS | Status: AC
  Administered 2021-03-08: 2000 mg via INTRAVENOUS
  Filled 2021-03-08: qty 400

## 2021-03-08 MED ORDER — VITAMIN B-12 1000 MCG PO TABS
1000.0000 ug | ORAL_TABLET | Freq: Every day | ORAL | Status: DC
  Administered 2021-03-08 – 2021-03-12 (×5): 1000 ug via ORAL
  Filled 2021-03-08 (×5): qty 1

## 2021-03-08 MED ORDER — VANCOMYCIN HCL 1250 MG/250ML IV SOLN
1250.0000 mg | Freq: Two times a day (BID) | INTRAVENOUS | Status: DC
  Administered 2021-03-08 – 2021-03-11 (×7): 1250 mg via INTRAVENOUS
  Filled 2021-03-08 (×7): qty 250

## 2021-03-08 MED ORDER — FLUTICASONE PROPIONATE 50 MCG/ACT NA SUSP
1.0000 | Freq: Two times a day (BID) | NASAL | Status: DC
  Administered 2021-03-08: 1 via NASAL
  Filled 2021-03-08: qty 16

## 2021-03-08 MED ORDER — METHOCARBAMOL 500 MG PO TABS
750.0000 mg | ORAL_TABLET | Freq: Three times a day (TID) | ORAL | Status: DC | PRN
  Administered 2021-03-08: 750 mg via ORAL
  Filled 2021-03-08: qty 2

## 2021-03-08 MED ORDER — TRAMADOL HCL 50 MG PO TABS
100.0000 mg | ORAL_TABLET | Freq: Four times a day (QID) | ORAL | Status: DC | PRN
  Administered 2021-03-08: 100 mg via ORAL
  Filled 2021-03-08: qty 2

## 2021-03-08 MED ORDER — SODIUM CHLORIDE 0.9 % IV SOLN
3.0000 g | Freq: Four times a day (QID) | INTRAVENOUS | Status: DC
  Administered 2021-03-08 – 2021-03-11 (×12): 3 g via INTRAVENOUS
  Filled 2021-03-08 (×2): qty 8
  Filled 2021-03-08 (×8): qty 3
  Filled 2021-03-08: qty 8
  Filled 2021-03-08 (×2): qty 3

## 2021-03-08 MED ORDER — RIVAROXABAN 10 MG PO TABS
10.0000 mg | ORAL_TABLET | Freq: Every day | ORAL | Status: DC
  Administered 2021-03-08 – 2021-03-12 (×5): 10 mg via ORAL
  Filled 2021-03-08 (×5): qty 1

## 2021-03-08 MED ORDER — ACETAMINOPHEN 325 MG PO TABS
650.0000 mg | ORAL_TABLET | Freq: Once | ORAL | Status: AC
  Administered 2021-03-08: 650 mg via ORAL
  Filled 2021-03-08: qty 2

## 2021-03-08 MED ORDER — HYDROCODONE-ACETAMINOPHEN 5-325 MG PO TABS: 1.0000 | ORAL_TABLET | ORAL | Status: DC | PRN

## 2021-03-08 MED ORDER — FLUTICASONE-UMECLIDIN-VILANT 100-62.5-25 MCG/INH IN AEPB: 1.0000 | INHALATION_SPRAY | Freq: Every day | RESPIRATORY_TRACT | Status: DC

## 2021-03-08 MED ORDER — GABAPENTIN 300 MG PO CAPS
300.0000 mg | ORAL_CAPSULE | Freq: Every day | ORAL | Status: DC
  Administered 2021-03-08 – 2021-03-11 (×4): 300 mg via ORAL
  Filled 2021-03-08 (×4): qty 1

## 2021-03-08 MED ORDER — ATORVASTATIN CALCIUM 40 MG PO TABS
40.0000 mg | ORAL_TABLET | Freq: Every day | ORAL | Status: DC
  Administered 2021-03-08 – 2021-03-11 (×4): 40 mg via ORAL
  Filled 2021-03-08 (×4): qty 1

## 2021-03-08 MED ORDER — MORPHINE SULFATE (PF) 4 MG/ML IV SOLN
4.0000 mg | INTRAVENOUS | Status: DC | PRN
  Administered 2021-03-08: 4 mg via INTRAVENOUS
  Filled 2021-03-08: qty 1

## 2021-03-08 MED ORDER — UMECLIDINIUM BROMIDE 62.5 MCG/INH IN AEPB
1.0000 | INHALATION_SPRAY | Freq: Every day | RESPIRATORY_TRACT | Status: DC
  Administered 2021-03-08 – 2021-03-12 (×5): 1 via RESPIRATORY_TRACT

## 2021-03-08 MED ORDER — ACETAMINOPHEN 650 MG RE SUPP: 650.0000 mg | Freq: Four times a day (QID) | RECTAL | Status: DC | PRN

## 2021-03-08 MED ORDER — MONTELUKAST SODIUM 10 MG PO TABS: 10.0000 mg | ORAL_TABLET | Freq: Every day | ORAL | Status: DC

## 2021-03-08 MED ORDER — ASCORBIC ACID 500 MG PO TABS
2000.0000 mg | ORAL_TABLET | Freq: Every day | ORAL | Status: DC
  Administered 2021-03-08 – 2021-03-12 (×5): 2000 mg via ORAL
  Filled 2021-03-08 (×5): qty 4

## 2021-03-08 MED ORDER — MORPHINE SULFATE (PF) 4 MG/ML IV SOLN
4.0000 mg | Freq: Once | INTRAVENOUS | Status: AC
  Administered 2021-03-08: 4 mg via INTRAVENOUS
  Filled 2021-03-08: qty 1

## 2021-03-08 MED ORDER — FLUTICASONE FUROATE-VILANTEROL 100-25 MCG/INH IN AEPB
1.0000 | INHALATION_SPRAY | Freq: Every day | RESPIRATORY_TRACT | Status: DC
  Administered 2021-03-08 – 2021-03-12 (×5): 1 via RESPIRATORY_TRACT

## 2021-03-08 MED ORDER — ACETAMINOPHEN 325 MG PO TABS
650.0000 mg | ORAL_TABLET | Freq: Four times a day (QID) | ORAL | Status: DC | PRN
  Administered 2021-03-08 – 2021-03-12 (×15): 650 mg via ORAL
  Filled 2021-03-08 (×15): qty 2

## 2021-03-08 MED ORDER — OMEGA-3-ACID ETHYL ESTERS 1 G PO CAPS
1.0000 g | ORAL_CAPSULE | Freq: Every day | ORAL | Status: DC
  Administered 2021-03-08 – 2021-03-12 (×5): 1 g via ORAL
  Filled 2021-03-08 (×5): qty 1

## 2021-03-08 MED ORDER — UMECLIDINIUM BROMIDE 62.5 MCG/INH IN AEPB
1.0000 | INHALATION_SPRAY | Freq: Every day | RESPIRATORY_TRACT | Status: DC
  Filled 2021-03-08: qty 7

## 2021-03-08 MED ORDER — VITAMIN D 25 MCG (1000 UNIT) PO TABS
2000.0000 [IU] | ORAL_TABLET | Freq: Two times a day (BID) | ORAL | Status: DC
  Administered 2021-03-08 – 2021-03-12 (×8): 2000 [IU] via ORAL
  Filled 2021-03-08 (×8): qty 2

## 2021-03-08 MED ORDER — DOCUSATE SODIUM 100 MG PO CAPS
100.0000 mg | ORAL_CAPSULE | Freq: Two times a day (BID) | ORAL | Status: DC | PRN
  Administered 2021-03-10 – 2021-03-12 (×4): 100 mg via ORAL
  Filled 2021-03-08 (×5): qty 1

## 2021-03-08 MED ORDER — ATORVASTATIN CALCIUM 40 MG PO TABS
40.0000 mg | ORAL_TABLET | Freq: Every day | ORAL | Status: DC
  Filled 2021-03-08: qty 1

## 2021-03-08 MED ORDER — METHOCARBAMOL 500 MG PO TABS
500.0000 mg | ORAL_TABLET | Freq: Three times a day (TID) | ORAL | Status: DC | PRN
  Administered 2021-03-08 – 2021-03-12 (×11): 500 mg via ORAL
  Filled 2021-03-08 (×11): qty 1

## 2021-03-08 MED ORDER — ONDANSETRON HCL 4 MG PO TABS
4.0000 mg | ORAL_TABLET | Freq: Four times a day (QID) | ORAL | Status: DC | PRN
Start: 2021-03-08 — End: 2021-03-12

## 2021-03-08 MED ORDER — CELECOXIB 100 MG PO CAPS
100.0000 mg | ORAL_CAPSULE | Freq: Two times a day (BID) | ORAL | Status: DC
  Administered 2021-03-08 – 2021-03-12 (×9): 100 mg via ORAL
  Filled 2021-03-08 (×9): qty 1

## 2021-03-08 MED ORDER — CALCIUM CARBONATE-VITAMIN D 500-200 MG-UNIT PO TABS
1.0000 | ORAL_TABLET | Freq: Two times a day (BID) | ORAL | Status: DC
  Administered 2021-03-08 – 2021-03-12 (×9): 1 via ORAL
  Filled 2021-03-08 (×9): qty 1
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
## 2021-03-08 NOTE — ED Provider Notes (Signed)
[facility] DEPT Provider Note   CSN: [id number] Arrival date & time: 03/08/21  0054      History Chief Complaint - knee pain  Kristopher Gay is a 70 y.o. male.  The history is provided by the patient.  Knee Pain Location:  Knee Pain details:    Quality:  Aching   Severity:  Moderate   Onset quality:  Gradual   Timing:  Constant   Progression:  Worsening Chronicity:  New Relieved by:  Nothing Exacerbated by: Movement. Associated symptoms: fever    Patient presents with right knee pain.  Patient underwent right quadricep tendon repair for recurrent rupture of the tendon.  This occurred on May 3.  Patient has since been at a nursing facility for rehab.  He reports over the past several days has had increasing pain in the knee after having physical therapy.  The right leg is in a splint.  He also reports recent cough and congestion.  He now has a fever.  No vomiting or diarrhea.    Past Medical History:  Diagnosis Date  . A-fib ([place])   . Allergic rhinitis, cause unspecified   . Allergy   . Anxiety   . Blood clot in vein 2002   left calf SUPERFICIAL CLOT REMOVED THEN PART OF VEIN REMOVED  . Chronic headaches   . COPD (chronic obstructive pulmonary disease) ([place])   . Cough    X 1 YEAR NO FEVER CLEAR SPUTUM  . DDD (degenerative disc disease)   . Depression   . DJD (degenerative joint disease)    ALL THE WAY DOWN SPINE  . Dropfoot    LEFT , NO BRACE WORN NOW  . Dysrhythmia    a fib one time after hernia surgery  . Emphysema of lung ([place])   . Extrinsic asthma, unspecified   . GERD (gastroesophageal reflux disease)    OCC NO MEDS FOR  . Hypertension    STOPPED HTN MEDS UNTIL 2011, THEN HAD WEIGHT LOSS, NO MEDS SINCE  . Neuromuscular disorder ([facility])    neuropathy  legs and feet  . Neuropathy    POLYNEUOPATHOPATHY FEET AND LEGS  . Peripheral vascular disease ([facility])    VARICOSE VEINS LEFT LEG  . Pneumonia AS CHILD  . PONV (postoperative  nausea and vomiting)   . Syncope   . Ulnar nerve entrapment at elbow, right   . Umbilical hernia   . Unspecified asthma(493.90)     Patient Active Problem List   Diagnosis Date Noted  . Rupture of quadriceps tendon, right, sequela 02/26/2021  . Rupture of right quadriceps tendon 08/07/2020  . Ulnar neuropathy at elbow, right 05/03/2018  . Neuritis of right ulnar nerve 04/27/2018  . PAF (paroxysmal atrial fibrillation) ([place]) 02/20/2018  . Chronic right shoulder pain 05/15/2016  . Right knee pain 05/15/2016  . Adrenal adenoma 02/20/2016  . Chronic pain syndrome 10/12/2015  . Dysphagia, pharyngoesophageal phase 09/14/2014  . Other chronic postoperative pain 01/16/2014  . Allergy, insect bite 07/02/2012  . GERD (gastroesophageal reflux disease) 03/25/2012  . 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 Lumbosacral spondylosis 01/13/2012  . Chronic back pain 11/17/2011  . Seasonal and perennial allergic rhinitis 12/26/2010  . Chronic pain 04/16/2009  . COPD (chronic obstructive pulmonary disease) ([place]) 01/30/2008    Past Surgical History:  Procedure Laterality Date  . COLONSCOPY  06/16/2017  . HERNIA REPAIR    . INGUINAL HERNIA REPAIR Bilateral 07/20/2017   Procedure: LAPAROSCOPIC BILATERAL INGUINAL HERNIA REPAIR  WITH MESH, UMBILICAL HERNIA REPAIR AND LYSIS OF ADHESIONS;  Surgeon: [name], [name], MD;  Location: [facility];  Service: General;  Laterality: Bilateral;  . LAPAROSCOPY N/A 07/20/2017   Procedure: LAPAROSCOPY DIAGNOSTIC;  Surgeon: Kieth Brightly [name], MD;  Location: [facility];  Service: General;  Laterality: N/A;  . QUADRICEPS TENDON REPAIR Right 08/07/2020   Procedure: REPAIR QUADRICEP TENDON;  Surgeon: Renette Butters, MD;  Location: [facility];  Service: Orthopedics;  Laterality: Right;  NEED RAPID TEST;SAME DAY WORKUP; COMING FROM [place]  . QUADRICEPS TENDON REPAIR Right 02/26/2021   Procedure: REPAIR QUADRICEP TENDON;  Surgeon: Renette Butters, MD;   Location: [facility];  Service: Orthopedics;  Laterality: Right;  . ROTATOR CUFF REPAIR Left 2005   detached; left shoulder-reattached  . SHOULDER SURGERY Right    laBRAL  tendon torn  . SUPERFICIAL LEFT LEG VEIN STRIPPING WITH SMALL SUPERFICIAL CLOT  2002  . ULNAR NERVE TRANSPOSITION Right 05/10/2018   Procedure: RIGHT ELBOW ULNAR NERVE RELEASE;  Surgeon: Iran Planas, MD;  Location: [facility];  Service: Orthopedics;  Laterality: Right;  . UMBILICAL HERNIA REPAIR  02/18/2018  . UMBILICAL HERNIA REPAIR N/A 02/18/2018   Procedure: LAPAROSCOPIC UMBILICAL HERNIA REPAIR ERAS PATHWAY;  Surgeon: [name], [name], MD;  Location: [facility];  Service: General;  Laterality: N/A;       Family History  Problem Relation Age of Onset  . Pancreatic cancer Mother   . Breast cancer Sister   . Epilepsy Brother   . Adrenal disorder Neg Hx     Social History   Tobacco Use  . Smoking status: Never Smoker  . Smokeless tobacco: Never Used  Vaping Use  . Vaping Use: Never used  Substance Use Topics  . Alcohol use: No    Alcohol/week: 0.0 standard drinks  . Drug use: No    Home Medications Prior to Admission medications   Medication Sig Start Date End Date Taking? Authorizing Provider  acetaminophen (TYLENOL) 500 MG tablet Take 2 tablets (1,000 mg total) by mouth every 6 (six) hours as needed for mild pain or moderate pain. 03/01/21   Britt Bottom, PA-C  ascorbic acid (VITAMIN C) 1000 MG tablet Take 2,000 mg by mouth daily.    [provider]  atorvastatin (LIPITOR) 40 MG tablet Take 40 mg by mouth daily.    [provider]  Azelastine-Fluticasone 137-50 MCG/ACT SUSP Place 1 spray into the nose as needed. Patient taking differently: Place 1 spray into the nose in the morning and at bedtime. 01/17/21   Dara Hoyer, FNP  Calcium Carbonate-Vit D-Min (CALCIUM 1200 PO) Take 1 tablet by mouth in the morning and at bedtime.    [provider]  celecoxib (CELEBREX) 100 MG capsule Take  100 mg by mouth in the morning and at bedtime.    [provider]  Cholecalciferol (VITAMIN D3) 50 MCG (2000 UT) capsule Take 2,000 Units by mouth in the morning and at bedtime.    [provider]  cyanocobalamin 1000 MCG tablet Take 1,000 mcg by mouth in the morning and at bedtime.    [provider]  DHEA 25 MG CAPS Take 25 mg by mouth daily.    [provider]  docusate sodium (COLACE) 100 MG capsule Take 1 capsule (100 mg total) by mouth 2 (two) times daily as needed for mild constipation. 03/01/21   Britt Bottom, PA-C  EPINEPHrine 0.3 mg/0.3 mL IJ SOAJ injection Inject 0.3 mg into the  muscle as needed for anaphylaxis.    [provider]  Fluticasone-Umeclidin-Vilant (TRELEGY ELLIPTA) 100-62.5-25 MCG/INH AEPB INHALE 1 PUFF INTO THE LUNGS EVERY DAY Patient taking differently: Inhale 1 puff into the lungs daily. 11/22/20   Parrett, Fonnie Mu, NP  furosemide (LASIX) 20 MG tablet TAKE 1 TABLET(20 MG) BY MOUTH DAILY Patient taking differently: Take 20 mg by mouth daily. 01/10/21   [name], MD  gabapentin (NEURONTIN) 300 MG capsule Take 2 capsule in the morning. Take 1 capsule Mid-day and 2 capsule at bedtime. Patient taking differently: Take 300-600 mg by mouth See admin instructions. Take 600 mg by mouth in the morning 300 mg Mid-day and 600 mg at bedtime. 01/25/21   Bayard Hugger, NP  Glucosamine HCl-MSM (GLUCOSAMINE-MSM PO) Take 1 tablet by mouth in the morning and at bedtime.    [provider]  Magnesium 250 MG TABS Take 250 mg by mouth daily.    [provider]  methocarbamol (ROBAXIN) 500 MG tablet Take 1 tablet (500 mg total) by mouth every 8 (eight) hours as needed for muscle spasms. 03/01/21   Britt Bottom, PA-C  metoprolol succinate (TOPROL-XL) 25 MG 24 hr tablet TAKE 1 TABLET(25 MG) BY MOUTH DAILY Patient taking differently: Take 25 mg by mouth daily. 08/02/20   [name], Nadean Corwin, MD  montelukast (SINGULAIR) 10 MG tablet  Take 1 tablet (10 mg total) by mouth daily. 01/17/21   Dara Hoyer, FNP  Multiple Vitamin (MULTIVITAMIN) tablet Take 1 tablet by mouth 2 (two) times daily.     [provider]  nitroGLYCERIN (NITROSTAT) 0.4 MG SL tablet Place 0.4 mg under the tongue every 5 (five) minutes as needed for chest pain.    [provider]  Omega-3 Fatty Acids (FISH OIL) 1200 MG CAPS Take 1,200 mg by mouth in the morning and at bedtime.    [provider]  Rivaroxaban (XARELTO) 15 MG TABS tablet Take 1 tablet (15 mg total) by mouth 2 (two) times daily with a meal. To prevent blood clots after surgery. 03/01/21   Britt Bottom, PA-C  traMADol (ULTRAM) 50 MG tablet Take 2 tablets (100 mg total) by mouth every 6 (six) hours as needed for severe pain. 03/01/21   Britt Bottom, PA-C  vitamin E 180 MG (400 UNITS) capsule Take 400 Units by mouth in the morning and at bedtime.    [provider]    Allergies    Bee venom, Linzess [linaclotide], Molds & smuts, and Seasonal ic [cholestatin]  Review of Systems   Review of Systems  Constitutional: Positive for fever.  Respiratory: Positive for cough.   Gastrointestinal: Negative for diarrhea and vomiting.  Musculoskeletal: Positive for arthralgias and joint swelling.  All other systems reviewed and are negative.   Physical Exam Updated Vital Signs BP (!) 154/87   Pulse 84   Temp (!) 100.5 F (38.1 C)   Resp 18   SpO2 95%   Physical Exam CONSTITUTIONAL: Well developed/well nourished, anxious HEAD: Normocephalic/atraumatic EYES: EOMI/PERRL ENMT: Mucous membranes moist NECK: supple no meningeal signs CV: S1/S2 noted, no murmurs/rubs/gallops noted LUNGS: Lungs are clear to auscultation bilaterally, no apparent distress ABDOMEN: soft, nontender, no rebound or guarding, bowel sounds noted throughout abdomen GU:no cva tenderness NEURO: Pt is awake/alert/appropriate, moves all extremitiesx4.  No facial droop.   EXTREMITIES: pulses  normal/equal in both feet. The right leg is in a splint. Splint was partially removed to expose the wound.  There is no crepitus.  See photo below SKIN: warm, color normal PSYCH: no abnormalities of mood noted, alert and oriented to situation    Patient gave verbal permission to utilize photo for medical documentation only The image was not stored on any personal device  ED Results / Procedures / Treatments   Labs (all labs ordered are listed, but only abnormal results are displayed) Labs Reviewed  BASIC METABOLIC PANEL - Abnormal; Notable for the following components:      Result Value   Glucose, Bld 114 (*)    All other components within normal limits  CBC WITH DIFFERENTIAL/PLATELET - Abnormal; Notable for the following components:   RBC 3.96 (*)    Hemoglobin 12.7 (*)    HCT 36.9 (*)    All other components within normal limits  SEDIMENTATION RATE - Abnormal; Notable for the following components:   Sed Rate 60 (*)    All other components within normal limits  C-REACTIVE PROTEIN - Abnormal; Notable for the following components:   CRP 8.4 (*)    All other components within normal limits  URINALYSIS, ROUTINE W REFLEX MICROSCOPIC - Abnormal; Notable for the following components:   APPearance CLOUDY (*)    All other components within normal limits  RESP PANEL BY RT-PCR (FLU A&B, COVID) [id number]    EKG None  Radiology DG Chest Port 1 View  Result Date: 03/08/2021 CLINICAL DATA:  Fever.  Recent knee surgery EXAM: PORTABLE CHEST 1 VIEW COMPARISON:  Chest x-ray 03/03/2018, CT heart 07/15/2019 FINDINGS: Cardiomegaly. The heart size and mediastinal contours are unchanged. Aortic arch calcifications. Interval development of nNodular-like densities overlying the left lung. Linear atelectasis of the right lower lung zone. No pulmonary edema. No pleural effusion. No pneumothorax. No acute osseous abnormality. IMPRESSION: Nodular-like densities overlying the left lung of unclear etiology.  Recommend CT chest for further evaluation. Electronically Signed   By: Iven Finn M.D.   On: 03/08/2021 02:34   DG Knee Right Port  Result Date: 03/08/2021 CLINICAL DATA:  Fever.  Recent knee surgery EXAM: PORTABLE RIGHT KNEE - 1-2 VIEW COMPARISON:  MR knee 12/21/2020 FINDINGS: Subcutaneus soft tissue edema and emphysema overlying the anterior distal femur. No cortical erosion or destruction. Surrounding subcutaneus soft tissue edema. No acute displaced fracture or dislocation. No definite joint effusion. IMPRESSION: 1. Subcutaneus soft tissue edema and emphysema overlying the anterior distal femur concerning for infection/inflammation. Underlying organized fluid collection not excluded. 2.  No acute displaced fracture or dislocation. 3. No definite joint effusion. Electronically Signed   By: Iven Finn M.D.   On: 03/08/2021 02:42    Procedures Procedures   Medications Ordered in ED Medications  piperacillin-tazobactam (ZOSYN) IVPB 3.375 g (0 g Intravenous Stopped 03/08/21 0432)  acetaminophen (TYLENOL) tablet 650 mg (650 mg Oral Given 03/08/21 0358)  vancomycin (VANCOREADY) IVPB 2000 mg/400 mL (0 mg Intravenous Stopped 03/08/21 0637)  methocarbamol (ROBAXIN) tablet 500 mg (500 mg Oral Given 03/08/21 [id number])    ED Course  I have reviewed the triage vital signs and the nursing notes.  Pertinent labs & imaging results that were available during my care of the patient were reviewed by me and considered in my medical decision making (see chart for details).    MDM Rules/Calculators/A&P                          3:00 AM Patient presents for increasing pain in the right knee.  Patient is now febrile.  Will consult his orthopedist. 3:36  AM Discussed the case with Dr. French Ana orthopedics. There is no obvious abscess to drain. He recommends broad-spectrum antibiotics, and will have Dr. Percell Miller see the patient later in the morning.  Of note, also found to have a lung nodule on x-ray patient  will  need to have an outpatient CT scan 7:23 AM Patient stable. No signs of UTI, COVID is negative.  At this point, only source of fever could be his knee. He has been given antibiotics. I have re-paged his orthopedist Dr. Percell Miller. Plan at signout to Dr. Francia Greaves is consult with orthopedics.  Patient may require admission for further antibiotic care.  Final Clinical Impression(s) / ED Diagnoses Final diagnoses:  Lung nodule  Acute pain of right knee    Rx / DC Orders ED Discharge Orders    None       Ripley Fraise, MD 03/08/21 [phone]

## 2021-03-08 NOTE — ED Triage Notes (Signed)
Pt had knee surgery on May 2nd and he has an appt with them at 10 am this morning, pt complains of knee pain with no help of the pain med

## 2021-03-08 NOTE — Progress Notes (Signed)
A consult was received from an ED physician for Vancomcycin per pharmacy dosing.  The patient's profile has been reviewed for ht/wt/allergies/indication/available labs.    A one time order has been placed for Vancomycin 2gm IV.    Further antibiotics/pharmacy consults should be ordered by admitting physician if indicated.                       Thank you, Everette Rank, PharmD 03/08/2021  3:45 AM

## 2021-03-08 NOTE — ED Notes (Signed)
Jennifer ( ortho tech) notified of the need for a long leg spllint

## 2021-03-08 NOTE — ED Notes (Signed)
Patient is resting in the bed- right leg with open dressing on.  Patient will  NOT allow the nurse to remove any soiled dressing from his leg.  "I want the Doctor to see this and you don't want to touch what has been messed up".  Right knee appears swollen, bruised with sutures in place.

## 2021-03-08 NOTE — ED Notes (Signed)
Doctor Francia Greaves was sent secure chat message regarding the patient requesting pain medication

## 2021-03-08 NOTE — ED Notes (Signed)
Kristopher Gay was called and asked to take a look at the patient information regarding admission to 1338

## 2021-03-08 NOTE — ED Provider Notes (Signed)
Seen after prior ED provider  Dr. Percell Miller is aware of case.  Dr. Debroah Loop PA has seen and evaluated the patient in person..  We will plan for medicine admission for overnight observation.  Ortho is writing consult note as this dictation is performed.  Hospitalist services aware of case and will evaluate for admission.  Patient understands plan of care.    Valarie Merino, MD 03/08/21 [phone]

## 2021-03-08 NOTE — Progress Notes (Signed)
Orthopedic Tech Progress Note Patient Details:  Kristopher Gay 14-May-1951 [id number]  Ortho Devices Type of Ortho Device: Ace wrap,Long leg splint,Stirrup splint Ortho Device/Splint Location: right Ortho Device/Splint Interventions: Application   Post Interventions Patient Tolerated: Well Instructions Provided: Care of device   [place] Pink 03/08/2021, 10:43 AM

## 2021-03-08 NOTE — ED Notes (Signed)
Kristopher Gay was sent a message in regards to the patient admission to room 1344

## 2021-03-08 NOTE — ED Notes (Signed)
Dressing applied to right knee area- no drainage noted.Sutures in place

## 2021-03-08 NOTE — Progress Notes (Signed)
Notified ED RN Charlane Ferretti that I have reviewed chart and unit is ready to accept patient. SBAR hand off complete

## 2021-03-08 NOTE — Progress Notes (Signed)
Pharmacy Antibiotic Note  GANESH DEEG is a 70 y.o. male admitted on 03/08/2021 with possible knee infection.  Pharmacy has been consulted for vanc/unasyn dosing.  Plan:  Vanc 2g x 1 then 1250mg  IV q12 - goal AUC 400-550  Unasyn 3g IV q6     Temp (24hrs), Avg:99.9 F (37.7 C), Min:99.3 F (37.4 C), Max:100.5 F (38.1 C)  Recent Labs  Lab 03/08/21 0216  WBC 8.8  CREATININE 0.83    Estimated Creatinine Clearance: 112.5 mL/min (by C-G formula based on SCr of 0.83 mg/dL).    Allergies  Allergen Reactions  . Bee Venom Swelling  . Linzess [Linaclotide] Other (See Comments)    Abdominal pain  . Molds & Smuts Other (See Comments)    Unknown  . Seasonal Ic [Cholestatin] Other (See Comments)    Environmental Allergies     Thank you for allowing pharmacy to be a part of this patient's care.  Adrian Saran, PharmD, BCPS Secure Chat if ?s 03/08/2021 11:07 AM

## 2021-03-08 NOTE — ED Notes (Signed)
Ortho PA is at the bedside

## 2021-03-08 NOTE — H&P (Signed)
History and Physical    Kristopher Gay WUJ:[id number] DOB: [birth year] DOA: 03/08/2021  PCP: [name], MD  Patient coming from: SNF at [facility]  Chief Complaint: right knee pain  HPI: Kristopher Gay is a 70 y.o. male with medical history significant of a fib, HTN, HLD. Presenting with right knee pain. Reports a recent completion of a right quadricept tendon repair. He was sent to SNF for rehab after his procedure. He reports having an overall difficulty with physical therapy. He found himself having sharp pains in his right knee. He reports that they are fairly constant and worsened with movement. He tried rest, but it didn't resolve the pain. He tried ice packs, APAP, robaxin and ultram. This combination did not completely resolve the pain. His pain worsened last night to the point that it could not be controlled. He requested transfer to the ED.  Of note, he reports that he felt as if he was hallucinating a few days ago. He did not report any hallucinations last night.  ED Course: He was found to have a fever of 100.5. He was found to have a CRP of 8.5. He was started on abx. Imaging of the knee was concerning for inflammation vs infection. Ortho was consulted. TRH was called for admission.    Review of Systems:  Denies CP, dyspnea, palpitations, N/V/D. Reports right knee pain, hallucinations. Review of systems is otherwise negative for all not mentioned in HPI.   PMHx Past Medical History:  Diagnosis Date  . A-fib ([place])   . Allergic rhinitis, cause unspecified   . Allergy   . Anxiety   . Blood clot in vein 2002   left calf SUPERFICIAL CLOT REMOVED THEN PART OF VEIN REMOVED  . Chronic headaches   . COPD (chronic obstructive pulmonary disease) ([place])   . Cough    X 1 YEAR NO FEVER CLEAR SPUTUM  . DDD (degenerative disc disease)   . Depression   . DJD (degenerative joint disease)    ALL THE WAY DOWN SPINE  . Dropfoot    LEFT , NO BRACE WORN NOW  . Dysrhythmia    a fib one time  after hernia surgery  . Emphysema of lung ([place])   . Extrinsic asthma, unspecified   . GERD (gastroesophageal reflux disease)    OCC NO MEDS FOR  . Hypertension    STOPPED HTN MEDS UNTIL 2011, THEN HAD WEIGHT LOSS, NO MEDS SINCE  . Neuromuscular disorder ([facility])    neuropathy  legs and feet  . Neuropathy    POLYNEUOPATHOPATHY FEET AND LEGS  . Peripheral vascular disease ([facility])    VARICOSE VEINS LEFT LEG  . Pneumonia AS CHILD  . PONV (postoperative nausea and vomiting)   . Syncope   . Ulnar nerve entrapment at elbow, right   . Umbilical hernia   . Unspecified asthma(493.90)     PSHx Past Surgical History:  Procedure Laterality Date  . COLONSCOPY  06/16/2017  . HERNIA REPAIR    . INGUINAL HERNIA REPAIR Bilateral 07/20/2017   Procedure: LAPAROSCOPIC BILATERAL INGUINAL HERNIA REPAIR WITH MESH, UMBILICAL HERNIA REPAIR AND LYSIS OF ADHESIONS;  Surgeon: [name], [name], MD;  Location: [facility];  Service: General;  Laterality: Bilateral;  . LAPAROSCOPY N/A 07/20/2017   Procedure: LAPAROSCOPY DIAGNOSTIC;  Surgeon: Kieth Brightly [name], MD;  Location: [facility];  Service: General;  Laterality: N/A;  . QUADRICEPS TENDON REPAIR Right 08/07/2020   Procedure: REPAIR QUADRICEP TENDON;  Surgeon: Renette Butters, MD;  Location: [facility];  Service: Orthopedics;  Laterality: Right;  NEED RAPID TEST;SAME DAY WORKUP; COMING FROM [place]  . QUADRICEPS TENDON REPAIR Right 02/26/2021   Procedure: REPAIR QUADRICEP TENDON;  Surgeon: Renette Butters, MD;  Location: [facility];  Service: Orthopedics;  Laterality: Right;  . ROTATOR CUFF REPAIR Left 2005   detached; left shoulder-reattached  . SHOULDER SURGERY Right    laBRAL  tendon torn  . SUPERFICIAL LEFT LEG VEIN STRIPPING WITH SMALL SUPERFICIAL CLOT  2002  . ULNAR NERVE TRANSPOSITION Right 05/10/2018   Procedure: RIGHT ELBOW ULNAR NERVE RELEASE;  Surgeon: Iran Planas, MD;  Location: [place];  Service: Orthopedics;  Laterality: Right;  .  UMBILICAL HERNIA REPAIR  02/18/2018  . UMBILICAL HERNIA REPAIR N/A 02/18/2018   Procedure: LAPAROSCOPIC UMBILICAL HERNIA REPAIR ERAS PATHWAY;  Surgeon: [name], [name], MD;  Location: [place];  Service: General;  Laterality: N/A;    SocHx  reports that he has never smoked. He has never used smokeless tobacco. He reports that he does not drink alcohol and does not use drugs.  Allergies  Allergen Reactions  . Bee Venom Swelling  . Linzess [Linaclotide] Other (See Comments)    Abdominal pain  . Molds & Smuts Other (See Comments)    Unknown  . Seasonal Ic [Cholestatin] Other (See Comments)    Environmental Allergies    FamHx Family History  Problem Relation Age of Onset  . Pancreatic cancer Mother   . Breast cancer Sister   . Epilepsy Brother   . Adrenal disorder Neg Hx     Prior to Admission medications   Medication Sig Start Date End Date Taking? Authorizing Provider  acetaminophen (TYLENOL) 500 MG tablet Take 2 tablets (1,000 mg total) by mouth every 6 (six) hours as needed for mild pain or moderate pain. 03/01/21  Yes [name], [name], PA-C  ascorbic acid (VITAMIN C) 1000 MG tablet Take 2,000 mg by mouth daily.   Yes [provider]  atorvastatin (LIPITOR) 40 MG tablet Take 40 mg by mouth daily.   Yes [provider]  Azelastine-Fluticasone 137-50 MCG/ACT SUSP Place 1 spray into the nose as needed. Patient taking differently: Place 1 spray into the nose in the morning and at bedtime. 01/17/21  Yes Ambs, Kathrine Cords, FNP  Calcium Carbonate-Vit D-Min (CALCIUM 1200 PO) Take 1 tablet by mouth in the morning and at bedtime.   Yes [provider]  celecoxib (CELEBREX) 100 MG capsule Take 100 mg by mouth in the morning and at bedtime.   Yes [provider]  Cholecalciferol (VITAMIN D3) 50 MCG (2000 UT) capsule Take 2,000 Units by mouth in the morning and at bedtime.   Yes [provider]  cyanocobalamin 1000 MCG tablet Take 1,000 mcg by mouth in  the morning and at bedtime.   Yes [provider]  DHEA 25 MG CAPS Take 25 mg by mouth daily.   Yes [provider]  docusate sodium (COLACE) 100 MG capsule Take 1 capsule (100 mg total) by mouth 2 (two) times daily as needed for mild constipation. 03/01/21  Yes [name], [name], PA-C  EPINEPHrine 0.3 mg/0.3 mL IJ SOAJ injection Inject 0.3 mg into the muscle as needed for anaphylaxis.   Yes [provider]  Fluticasone-Umeclidin-Vilant (TRELEGY ELLIPTA) 100-62.5-25 MCG/INH AEPB INHALE 1 PUFF INTO THE LUNGS EVERY DAY Patient taking differently: Inhale 1 puff into the lungs daily. 11/22/20  Yes Parrett, Tammy S, NP  furosemide (LASIX) 20 MG tablet  TAKE 1 TABLET(20 MG) BY MOUTH DAILY Patient taking differently: Take 20 mg by mouth daily. 01/10/21  Yes [name], MD  gabapentin (NEURONTIN) 300 MG capsule Take 2 capsule in the morning. Take 1 capsule Mid-day and 2 capsule at bedtime. Patient taking differently: Take 300-600 mg by mouth See admin instructions. Take 600 mg by mouth in the morning 300 mg Mid-day and 600 mg at bedtime. 01/25/21  Yes Bayard Hugger, NP  Glucosamine HCl-MSM (GLUCOSAMINE-MSM PO) Take 1 tablet by mouth in the morning and at bedtime.   Yes [provider]  Magnesium 250 MG TABS Take 250 mg by mouth daily.   Yes [provider]  methocarbamol (ROBAXIN) 500 MG tablet Take 1 tablet (500 mg total) by mouth every 8 (eight) hours as needed for muscle spasms. 03/01/21  Yes [name], [name], PA-C  metoprolol succinate (TOPROL-XL) 25 MG 24 hr tablet TAKE 1 TABLET(25 MG) BY MOUTH DAILY Patient taking differently: Take 25 mg by mouth daily. 08/02/20  Yes [name], Nadean Corwin, MD  montelukast (SINGULAIR) 10 MG tablet Take 1 tablet (10 mg total) by mouth daily. 01/17/21  Yes Ambs, Kathrine Cords, FNP  Multiple Vitamin (MULTIVITAMIN) tablet Take 1 tablet by mouth 2 (two) times daily.    Yes [provider]  nitroGLYCERIN (NITROSTAT) 0.4 MG SL tablet Place  0.4 mg under the tongue every 5 (five) minutes as needed for chest pain.   Yes [provider]  Omega-3 Fatty Acids (FISH OIL) 1200 MG CAPS Take 1,200 mg by mouth in the morning and at bedtime.   Yes [provider]  Rivaroxaban (XARELTO) 15 MG TABS tablet Take 1 tablet (15 mg total) by mouth 2 (two) times daily with a meal. To prevent blood clots after surgery. 03/01/21  Yes [name], [name], PA-C  traMADol (ULTRAM) 50 MG tablet Take 2 tablets (100 mg total) by mouth every 6 (six) hours as needed for severe pain. 03/01/21  Yes [name], [name], PA-C  vitamin E 180 MG (400 UNITS) capsule Take 400 Units by mouth in the morning and at bedtime.   Yes [provider]    Physical Exam: Vitals:   03/08/21 0630 03/08/21 0653 03/08/21 0741 03/08/21 0836  BP: 136/79  140/89 (!) 144/71  Pulse: 81  76 85  Resp: (!) 22  20 20   Temp:  99.3 F (37.4 C)    TempSrc:      SpO2: 92%  93% 93%    General: 70 y.o. male resting in bed in NAD Eyes: PERRL, normal sclera ENMT: Nares patent w/o discharge, orophaynx clear, dentition normal, ears w/o discharge/lesions/ulcers Neck: Supple, trachea midline Cardiovascular: RRR, +S1, S2, no m/g/r, equal pulses throughout Respiratory: CTABL, no w/r/r, normal WOB GI: BS+, NDNT, no masses noted, no organomegaly noted MSK: No c/c; right knee w/ post-surgical swelling, incision and suture sites look clean w/o drainage, limited R knee mobility d/t pain/swelling Skin: No rashes, bruises, ulcerations noted Neuro: A&O x 3, no focal deficits Psyc: Appropriate interaction and anxious affect, cooperative  Labs on Admission: I have personally reviewed following labs and imaging studies  CBC: Recent Labs  Lab 03/08/21 0216  WBC 8.8  NEUTROABS 7.0  HGB 12.7*  HCT 36.9*  MCV 93.2  PLT 123XX123   Basic Metabolic Panel: Recent Labs  Lab 03/08/21 0216  NA 136  K 4.2  CL 105  CO2 25  GLUCOSE 114*  BUN 19  CREATININE 0.83  CALCIUM 9.2    GFR:  Estimated Creatinine Clearance: 112.5 mL/min (by C-G formula based on SCr of 0.83 mg/dL). Liver Function Tests: No results for input(s): AST, ALT, ALKPHOS, BILITOT, PROT, ALBUMIN in the last 168 hours. No results for input(s): LIPASE, AMYLASE in the last 168 hours. No results for input(s): AMMONIA in the last 168 hours. Coagulation Profile: No results for input(s): INR, PROTIME in the last 168 hours. Cardiac Enzymes: No results for input(s): CKTOTAL, CKMB, CKMBINDEX, TROPONINI in the last 168 hours. BNP (last 3 results) No results for input(s): PROBNP in the last 8760 hours. HbA1C: No results for input(s): HGBA1C in the last 72 hours. CBG: No results for input(s): GLUCAP in the last 168 hours. Lipid Profile: No results for input(s): CHOL, HDL, LDLCALC, TRIG, CHOLHDL, LDLDIRECT in the last 72 hours. Thyroid Function Tests: No results for input(s): TSH, T4TOTAL, FREET4, T3FREE, THYROIDAB in the last 72 hours. Anemia Panel: No results for input(s): VITAMINB12, FOLATE, FERRITIN, TIBC, IRON, RETICCTPCT in the last 72 hours. Urine analysis:    Component Value Date/Time   COLORURINE YELLOW 03/08/2021 0349   APPEARANCEUR CLOUDY (A) 03/08/2021 0349   LABSPEC 1.019 03/08/2021 0349   LABSPEC 1.020 11/15/2019 1449   PHURINE 7.0 03/08/2021 0349   GLUCOSEU NEGATIVE 03/08/2021 0349   HGBUR NEGATIVE 03/08/2021 0349   BILIRUBINUR NEGATIVE 03/08/2021 0349   BILIRUBINUR negative 11/15/2019 [place] 03/08/2021 0349   PROTEINUR NEGATIVE 03/08/2021 0349   UROBILINOGEN 0.2 12/13/2015 1902   NITRITE NEGATIVE 03/08/2021 0349   LEUKOCYTESUR NEGATIVE 03/08/2021 0349    Radiological Exams on Admission: DG Chest Port 1 View  Result Date: 03/08/2021 CLINICAL DATA:  Fever.  Recent knee surgery EXAM: PORTABLE CHEST 1 VIEW COMPARISON:  Chest x-ray 03/03/2018, CT heart 07/15/2019 FINDINGS: Cardiomegaly. The heart size and mediastinal contours are unchanged. Aortic arch  calcifications. Interval development of nNodular-like densities overlying the left lung. Linear atelectasis of the right lower lung zone. No pulmonary edema. No pleural effusion. No pneumothorax. No acute osseous abnormality. IMPRESSION: Nodular-like densities overlying the left lung of unclear etiology. Recommend CT chest for further evaluation. Electronically Signed   By: Iven Finn M.D.   On: 03/08/2021 02:34   DG Knee Right Port  Result Date: 03/08/2021 CLINICAL DATA:  Fever.  Recent knee surgery EXAM: PORTABLE RIGHT KNEE - 1-2 VIEW COMPARISON:  MR knee 12/21/2020 FINDINGS: Subcutaneus soft tissue edema and emphysema overlying the anterior distal femur. No cortical erosion or destruction. Surrounding subcutaneus soft tissue edema. No acute displaced fracture or dislocation. No definite joint effusion. IMPRESSION: 1. Subcutaneus soft tissue edema and emphysema overlying the anterior distal femur concerning for infection/inflammation. Underlying organized fluid collection not excluded. 2.  No acute displaced fracture or dislocation. 3. No definite joint effusion. Electronically Signed   By: Iven Finn M.D.   On: 03/08/2021 02:42   Assessment/Plan Right knee pain     - place in obs, med-surg     - ortho has reviewed: not recommending any further imaging at this time; they are replacing splint; Per note they this SSI risk is low, but reasonable to continue abx ON; continue pain control     - appreciate ortho assistance     - continue DVT PPx  Lung nodules Atelectasis     - nodule-like densities over left lobe as seen on CXR; CT recommended for follow up, this would need to be a contrasted study for his first eval; this can be an outpatient scan     - atelectasis noted on XR;  add IS     - he is not complaining of respiratory symptoms at this time  Fever     - temp 100.5 at presentation     - UA ok, WBC wnl     - knee was believed to be the source initially, and thus, he was started on  abx; reasonable to continue for now     - atelectasis seen on CXR; possible cause, follow  HTN ?p A Fib     - he reports that he's on metoprolol for HTN     - he reports having one episode of a fib in the past, but not actively treated for it. He is not on anticoagulation for a fib     - continue metorpolol, lasix  HLD     - continue home statin  DVT prophylaxis: xarelto  Code Status: FULL  Family Communication: None at bedside.  Consults called: EDP spoke with Ortho   Status is: Observation  The patient remains OBS appropriate and will d/c before 2 midnights.  Dispo: The patient is from: SNF              Anticipated d/c is to: SNF              Patient currently is not medically stable to d/c.   Difficult to place patient No  Time spent coordinating admission: 70 minutes  [facility]  If 7PM-7AM, please contact night-coverage [email or web address]  03/08/2021, 9:48 AM

## 2021-03-08 NOTE — ED Notes (Signed)
Ortho tech at the bedside.  

## 2021-03-09 DIAGNOSIS — Z888 Allergy status to other drugs, medicaments and biological substances status: Secondary | ICD-10-CM | POA: Diagnosis not present

## 2021-03-09 DIAGNOSIS — E785 Hyperlipidemia, unspecified: Secondary | ICD-10-CM | POA: Diagnosis present

## 2021-03-09 DIAGNOSIS — Z20822 Contact with and (suspected) exposure to covid-19: Secondary | ICD-10-CM | POA: Diagnosis present

## 2021-03-09 DIAGNOSIS — R918 Other nonspecific abnormal finding of lung field: Secondary | ICD-10-CM | POA: Diagnosis present

## 2021-03-09 DIAGNOSIS — Z9889 Other specified postprocedural states: Secondary | ICD-10-CM | POA: Diagnosis not present

## 2021-03-09 DIAGNOSIS — Z820 Family history of epilepsy and other diseases of the nervous system: Secondary | ICD-10-CM | POA: Diagnosis not present

## 2021-03-09 DIAGNOSIS — I480 Paroxysmal atrial fibrillation: Secondary | ICD-10-CM | POA: Diagnosis present

## 2021-03-09 DIAGNOSIS — Z800 Family history of malignant neoplasm of digestive organs: Secondary | ICD-10-CM | POA: Diagnosis not present

## 2021-03-09 DIAGNOSIS — M25561 Pain in right knee: Secondary | ICD-10-CM | POA: Diagnosis present

## 2021-03-09 DIAGNOSIS — Z79899 Other long term (current) drug therapy: Secondary | ICD-10-CM | POA: Diagnosis not present

## 2021-03-09 DIAGNOSIS — Z803 Family history of malignant neoplasm of breast: Secondary | ICD-10-CM | POA: Diagnosis not present

## 2021-03-09 DIAGNOSIS — R443 Hallucinations, unspecified: Secondary | ICD-10-CM | POA: Diagnosis present

## 2021-03-09 DIAGNOSIS — R0902 Hypoxemia: Secondary | ICD-10-CM | POA: Diagnosis not present

## 2021-03-09 DIAGNOSIS — I10 Essential (primary) hypertension: Secondary | ICD-10-CM | POA: Diagnosis present

## 2021-03-09 DIAGNOSIS — G8929 Other chronic pain: Secondary | ICD-10-CM | POA: Diagnosis not present

## 2021-03-09 DIAGNOSIS — Z7901 Long term (current) use of anticoagulants: Secondary | ICD-10-CM | POA: Diagnosis not present

## 2021-03-09 DIAGNOSIS — J9811 Atelectasis: Secondary | ICD-10-CM | POA: Diagnosis present

## 2021-03-09 DIAGNOSIS — J439 Emphysema, unspecified: Secondary | ICD-10-CM | POA: Diagnosis present

## 2021-03-09 DIAGNOSIS — Z91030 Bee allergy status: Secondary | ICD-10-CM | POA: Diagnosis not present

## 2021-03-09 LAB — COMPREHENSIVE METABOLIC PANEL
ALT: 24 U/L (ref 0–44)
AST: 22 U/L (ref 15–41)
Albumin: 3.3 g/dL — ABNORMAL LOW (ref 3.5–5.0)
Alkaline Phosphatase: 62 U/L (ref 38–126)
Anion gap: 8 (ref 5–15)
BUN: 22 mg/dL (ref 8–23)
CO2: 25 mmol/L (ref 22–32)
Calcium: 8.9 mg/dL (ref 8.9–10.3)
Chloride: 105 mmol/L (ref 98–111)
Creatinine, Ser: 0.9 mg/dL (ref 0.61–1.24)
GFR, Estimated: >60 mL/min (ref >60)
Glucose, Bld: 96 mg/dL (ref 70–99)
Potassium: 4 mmol/L (ref 3.5–5.1)
Sodium: 138 mmol/L (ref 135–145)
Total Bilirubin: 1.1 mg/dL (ref 0.3–1.2)
Total Protein: 6.7 g/dL (ref 6.5–8.1)

## 2021-03-09 LAB — CBC
HCT: 35.7 % — ABNORMAL LOW (ref 39.0–52.0)
Hemoglobin: 11.5 g/dL — ABNORMAL LOW (ref 13.0–17.0)
MCH: 31.6 pg (ref 26.0–34.0)
MCHC: 32.2 g/dL (ref 30.0–36.0)
MCV: 98.1 fL (ref 80.0–100.0)
Platelets: 228 10*3/uL (ref 150–400)
RBC: 3.64 MIL/uL — ABNORMAL LOW (ref 4.22–5.81)
RDW: 13.7 % (ref 11.5–15.5)
WBC: 8.3 10*3/uL (ref 4.0–10.5)
nRBC: 0 % (ref 0.0–0.2)

## 2021-03-09 LAB — HIV ANTIBODY (ROUTINE TESTING W REFLEX): HIV Screen 4th Generation wRfx: NONREACTIVE

## 2021-03-09 MED ORDER — POLYETHYLENE GLYCOL 3350 17 G PO PACK
17.0000 g | PACK | Freq: Every day | ORAL | Status: DC | PRN
  Administered 2021-03-11 – 2021-03-12 (×2): 17 g via ORAL
  Filled 2021-03-09 (×3): qty 1

## 2021-03-09 NOTE — Progress Notes (Signed)
SPORTS MEDICINE AND JOINT REPLACEMENT  Lara Mulch, MD    Carlyon Shadow, PA-C [address], [place], [place]  [id number]                             [phone]   PROGRESS NOTE  Subjective:  negative for Chest Pain  negative for Shortness of Breath  negative for Nausea/Vomiting   negative for Calf Pain  negative for Bowel Movement   Tolerating Diet: yes         Patient reports pain as 4 on 0-10 scale.    Objective: Vital signs in last 24 hours:   Patient Vitals for the past 24 hrs:  BP Temp Pulse Resp SpO2  03/09/21 0756 -- -- -- -- 92 %  03/08/21 2201 121/69 98.5 F (36.9 C) 70 16 95 %  03/08/21 1523 (!) 147/75 99.2 F (37.3 C) 77 19 93 %  03/08/21 1442 -- -- -- -- 95 %  03/08/21 0836 (!) 144/71 -- 85 20 93 %    @flow {1959:LAST@   Intake/Output from previous day:   05/13 0701 - 05/14 0700 In: 1750 [P.O.:1200] Out: 1100 [Urine:1100]   Intake/Output this shift:   No intake/output data recorded.   Intake/Output      05/13 0701 05/14 0700 05/14 0701 05/15 0700   P.O. 1200    IV Piggyback 550    Total Intake 1750    Urine 1100    Stool 0    Total Output 1100    Net +650         Urine Occurrence 2 x 1 x   Stool Occurrence 0 x       LABORATORY DATA: Recent Labs    03/08/21 0216 03/09/21 0318  WBC 8.8 8.3  HGB 12.7* 11.5*  HCT 36.9* 35.7*  PLT 237 228   Recent Labs    03/08/21 0216 03/09/21 0318  NA 136 138  K 4.2 4.0  CL 105 105  CO2 25 25  BUN 19 22  CREATININE 0.83 0.90  GLUCOSE 114* 96  CALCIUM 9.2 8.9   Lab Results  Component Value Date   INR 1.00 05/10/2018    Examination:  General appearance: alert, cooperative and no distress Extremities: extremities normal, atraumatic, no cyanosis or edema  Wound Exam: clean, dry, intact   Drainage:  None: wound tissue dry  Motor Exam: Quadriceps and Hamstrings Intact  Sensory Exam: Superficial Peroneal, Deep Peroneal and Tibial normal   Assessment:         ADDITIONAL  DIAGNOSIS:  Active Problems:   Right knee pain     Plan: Physical Therapy as ordered Non Weight Bearing (NWB)  DVT Prophylaxis:  Lovenox  DISCHARGE PLAN: Skilled Nursing Facility/Rehab  Patient doing well and normal post op findings noted. Ok to return to SNF from an ortho standpoint. Will continue to follow.   Donia Ast 03/09/2021, 8:12 AM

## 2021-03-09 NOTE — Progress Notes (Signed)
PROGRESS NOTE    Kristopher Gay  KYH:[id number] DOB: 25-Jun-1951 DOA: 03/08/2021 PCP: [name], MD   Brief Narrative: This 70 years old male with PMH significant for A. fib, hypertension, hyperlipidemia presents in the ED with right knee pain.  Patient reports He recently underwent  right quadriceps tendon repair and he was sent to  skilled nursing facility for rehabilitation after his procedure.  He reports having an overall difficulty with physical therapy.He found himself having sharp pain in the right knee which are fairly constant and worsened with movement.  He has tried rest , ice packs, hydrocodone, Robaxin and Ultram that did not completely resolve his pain.  Last night his pain got to a point where he could not tolerate,  He requested transfer to the ED. Patient was seen by orthopedics,  recommended nonweightbearing.  Patient is cleared from Ortho to be discharged back to skilled nursing facility.  Assessment & Plan:   Active Problems:   Right knee pain  Right knee pain:  Patient recently underwent right quadriceps repair last week. Patient was discharged to skilled nursing facility for rehabilitation. Patient continued to have constant pain despite being on pain meds. Xray Right Knee: Subcutaneus soft tissue edema and emphysema overlying the anterior distal femur concerning for infection/inflammation Orthopedics was consulted,  recommending no further imaging at this point. Splint was replaced, advised to continue antibiotics. Continue DVT prophylaxis. Patient had a fever 100.5 on admission.  No leukocytosis.  UA unremarkable. Knee was believed to be source initially.  Follow blood cultures.   Hypertension: Continue metoprolol.  Atrial Fibrillation ? Patient report having an episode of atrial fibrillation in the past but not actively treated. Obtain EKG.  Hyperlipidemia:  continue home statin.  Lung nodules : Patient needs outpatient imaging. Denies any shortness  of breath.    DVT prophylaxis:  Lovenox. Code Status:Full code. Family Communication:  No family at bedside. Disposition Plan:   Status is: Observation  The patient remains OBS appropriate and will d/c before 2 midnights.  Dispo: The patient is from: SNF              Anticipated d/c is to: SNF              Patient currently is medically stable for discharge.   Difficult to place patient No   Consultants:   Orthopedics  Procedures: S/p right quadriceps tendon repair last week. Antimicrobials:  Anti-infectives (From admission, onward)   Start     Dose/Rate Route Frequency Ordered Stop   03/08/21 1800  vancomycin (VANCOREADY) IVPB 1250 mg/250 mL        1,250 mg 166.7 mL/hr over 90 Minutes Intravenous Every 12 hours 03/08/21 1109     03/08/21 1200  Ampicillin-Sulbactam (UNASYN) 3 g in sodium chloride 0.9 % 100 mL IVPB        3 g 200 mL/hr over 30 Minutes Intravenous Every 6 hours 03/08/21 1109     03/08/21 0345  piperacillin-tazobactam (ZOSYN) IVPB 3.375 g        3.375 g 100 mL/hr over 30 Minutes Intravenous  Once 03/08/21 0336 03/08/21 0432   03/08/21 0345  vancomycin (VANCOREADY) IVPB 2000 mg/400 mL        2,000 mg 200 mL/hr over 120 Minutes Intravenous  Once 03/08/21 0344 03/08/21 1517      Subjective: Patient was seen and examined at bedside.  Overnight events noted.  Patient reports feeling better,  still has increased pain.  Patient has a lot  of questions.  He was febrile on admission, no fever after admission.  Objective: Vitals:   03/08/21 1442 03/08/21 1523 03/08/21 2201 03/09/21 0756  BP:  (!) 147/75 121/69   Pulse:  77 70   Resp:  19 16   Temp:  99.2 F (37.3 C) 98.5 F (36.9 C)   TempSrc:      SpO2: 95% 93% 95% 92%    Intake/Output Summary (Last 24 hours) at 03/09/2021 1131 Last data filed at 03/09/2021 1009 Gross per 24 hour  Intake 2100 ml  Output 1100 ml  Net 1000 ml   There were no vitals filed for this visit.  Examination:  General exam:  Appears calm and comfortable, not in any acute distress. Respiratory system: Clear to auscultation. Respiratory effort normal. Cardiovascular system: S1 & S2 heard, RRR. No JVD, murmurs, rubs, gallops or clicks. No pedal edema. Gastrointestinal system: Abdomen is nondistended, soft and nontender. No organomegaly or masses felt. Normal bowel sounds heard. Central nervous system: Alert and oriented. No focal neurological deficits. Extremities: Right lower extremity in cast and bandage.  Reports having significant pain Skin: No rashes, lesions or ulcers Psychiatry: Judgement and insight appear normal. Mood & affect appropriate.     Data Reviewed: I have personally reviewed following labs and imaging studies  CBC: Recent Labs  Lab 03/08/21 0216 03/09/21 0318  WBC 8.8 8.3  NEUTROABS 7.0  --   HGB 12.7* 11.5*  HCT 36.9* 35.7*  MCV 93.2 98.1  PLT 237 956   Basic Metabolic Panel: Recent Labs  Lab 03/08/21 0216 03/09/21 0318  NA 136 138  K 4.2 4.0  CL 105 105  CO2 25 25  GLUCOSE 114* 96  BUN 19 22  CREATININE 0.83 0.90  CALCIUM 9.2 8.9   GFR: Estimated Creatinine Clearance: 103.8 mL/min (by C-G formula based on SCr of 0.9 mg/dL). Liver Function Tests: Recent Labs  Lab 03/09/21 0318  AST 22  ALT 24  ALKPHOS 62  BILITOT 1.1  PROT 6.7  ALBUMIN 3.3*   No results for input(s): LIPASE, AMYLASE in the last 168 hours. No results for input(s): AMMONIA in the last 168 hours. Coagulation Profile: No results for input(s): INR, PROTIME in the last 168 hours. Cardiac Enzymes: No results for input(s): CKTOTAL, CKMB, CKMBINDEX, TROPONINI in the last 168 hours. BNP (last 3 results) No results for input(s): PROBNP in the last 8760 hours. HbA1C: No results for input(s): HGBA1C in the last 72 hours. CBG: No results for input(s): GLUCAP in the last 168 hours. Lipid Profile: No results for input(s): CHOL, HDL, LDLCALC, TRIG, CHOLHDL, LDLDIRECT in the last 72 hours. Thyroid Function  Tests: No results for input(s): TSH, T4TOTAL, FREET4, T3FREE, THYROIDAB in the last 72 hours. Anemia Panel: No results for input(s): VITAMINB12, FOLATE, FERRITIN, TIBC, IRON, RETICCTPCT in the last 72 hours. Sepsis Labs: No results for input(s): PROCALCITON, LATICACIDVEN in the last 168 hours.  Recent Results (from the past 240 hour(s))  SARS CORONAVIRUS 2 (TAT 6-24 HRS) Nasopharyngeal Nasopharyngeal Swab     Status: None   Collection Time: 02/27/21  4:30 PM   Specimen: Nasopharyngeal Swab  Result Value Ref Range Status   SARS Coronavirus 2 NEGATIVE NEGATIVE Final    Comment: (NOTE) SARS-CoV-2 target nucleic acids are NOT DETECTED.  The SARS-CoV-2 RNA is generally detectable in upper and lower respiratory specimens during the acute phase of infection. Negative results do not preclude SARS-CoV-2 infection, do not rule out co-infections with other pathogens, and should not be  used as the sole basis for treatment or other patient management decisions. Negative results must be combined with clinical observations, patient history, and epidemiological information. The expected result is Negative.  Fact Sheet for Patients: SugarRoll.be  Fact Sheet for Healthcare Providers: https://www.woods-mathews.com/  This test is not yet approved or cleared by the [place] FDA and  has been authorized for detection and/or diagnosis of SARS-CoV-2 by FDA under an Emergency Use Authorization (EUA). This EUA will remain  in effect (meaning this test can be used) for the duration of the COVID-19 declaration under Se ction 564(b)(1) of the Act, 21 U.S.C. section 360bbb-3(b)(1), unless the authorization is terminated or revoked sooner.  Performed at [facility], [place] [address] [place], [place] [address]   Resp Panel by RT-PCR (Flu A&B, Covid) Nasopharyngeal Swab     Status: None   Collection Time: 03/08/21  3:49 AM   Specimen: Nasopharyngeal Swab;  Nasopharyngeal(NP) swabs in vial transport medium  Result Value Ref Range Status   SARS Coronavirus 2 by RT PCR NEGATIVE NEGATIVE Final    Comment: (NOTE) SARS-CoV-2 target nucleic acids are NOT DETECTED.  The SARS-CoV-2 RNA is generally detectable in upper respiratory specimens during the acute phase of infection. The lowest concentration of SARS-CoV-2 viral copies this assay can detect is 138 copies/mL. A negative result does not preclude SARS-Cov-2 infection and should not be used as the sole basis for treatment or other patient management decisions. A negative result may occur with  improper specimen collection/handling, submission of specimen other than nasopharyngeal swab, presence of viral mutation(s) within the areas targeted by this assay, and inadequate number of viral copies(<138 copies/mL). A negative result must be combined with clinical observations, patient history, and epidemiological information. The expected result is Negative.  Fact Sheet for Patients:  EntrepreneurPulse.com.au  Fact Sheet for Healthcare Providers:  IncredibleEmployment.be  This test is no t yet approved or cleared by the [place] FDA and  has been authorized for detection and/or diagnosis of SARS-CoV-2 by FDA under an Emergency Use Authorization (EUA). This EUA will remain  in effect (meaning this test can be used) for the duration of the COVID-19 declaration under Section 564(b)(1) of the Act, 21 U.S.C.section 360bbb-3(b)(1), unless the authorization is terminated  or revoked sooner.       Influenza A by PCR NEGATIVE NEGATIVE Final   Influenza B by PCR NEGATIVE NEGATIVE Final    Comment: (NOTE) The Xpert Xpress SARS-CoV-2/FLU/RSV plus assay is intended as an aid in the diagnosis of influenza from Nasopharyngeal swab specimens and should not be used as a sole basis for treatment. Nasal washings and aspirates are unacceptable for Xpert Xpress  SARS-CoV-2/FLU/RSV testing.  Fact Sheet for Patients: EntrepreneurPulse.com.au  Fact Sheet for Healthcare Providers: IncredibleEmployment.be  This test is not yet approved or cleared by the [place] FDA and has been authorized for detection and/or diagnosis of SARS-CoV-2 by FDA under an Emergency Use Authorization (EUA). This EUA will remain in effect (meaning this test can be used) for the duration of the COVID-19 declaration under Section 564(b)(1) of the Act, 21 U.S.C. section 360bbb-3(b)(1), unless the authorization is terminated or revoked.  Performed at [facility], [place] [address] [place], [place] [address]      Radiology Studies: [facility] Chest Port 1 View  Result Date: 03/08/2021 CLINICAL DATA:  Fever.  Recent knee surgery EXAM: PORTABLE CHEST 1 VIEW COMPARISON:  Chest x-ray 03/03/2018, CT heart 07/15/2019 FINDINGS: Cardiomegaly. The heart size and mediastinal  contours are unchanged. Aortic arch calcifications. Interval development of nNodular-like densities overlying the left lung. Linear atelectasis of the right lower lung zone. No pulmonary edema. No pleural effusion. No pneumothorax. No acute osseous abnormality. IMPRESSION: Nodular-like densities overlying the left lung of unclear etiology. Recommend CT chest for further evaluation. Electronically Signed   By: Iven Finn M.D.   On: 03/08/2021 02:34   DG Knee Right Port  Result Date: 03/08/2021 CLINICAL DATA:  Fever.  Recent knee surgery EXAM: PORTABLE RIGHT KNEE - 1-2 VIEW COMPARISON:  MR knee 12/21/2020 FINDINGS: Subcutaneus soft tissue edema and emphysema overlying the anterior distal femur. No cortical erosion or destruction. Surrounding subcutaneus soft tissue edema. No acute displaced fracture or dislocation. No definite joint effusion. IMPRESSION: 1. Subcutaneus soft tissue edema and emphysema overlying the anterior distal femur concerning for  infection/inflammation. Underlying organized fluid collection not excluded. 2.  No acute displaced fracture or dislocation. 3. No definite joint effusion. Electronically Signed   By: Iven Finn M.D.   On: 03/08/2021 02:42    Scheduled Meds: . ascorbic acid  2,000 mg Oral Daily  . atorvastatin  40 mg Oral Daily  . azelastine  1 spray Each Nare BID   And  . fluticasone  1 spray Each Nare BID  . calcium-vitamin D  1 tablet Oral BID  . celecoxib  100 mg Oral BID  . cholecalciferol  2,000 Units Oral BID  . fluticasone furoate-vilanterol  1 puff Inhalation Daily   And  . umeclidinium bromide  1 puff Inhalation Daily  . furosemide  20 mg Oral Daily  . gabapentin  600 mg Oral BID   And  . gabapentin  300 mg Oral q1600  . metoprolol succinate  25 mg Oral Daily  . montelukast  10 mg Oral Daily  . multivitamin with minerals  1 tablet Oral BID  . omega-3 acid ethyl esters  1 g Oral Daily  . rivaroxaban  10 mg Oral Daily  . cyanocobalamin  1,000 mcg Oral Daily   Continuous Infusions: . ampicillin-sulbactam (UNASYN) IV 3 g (03/09/21 3532)  . vancomycin 1,250 mg (03/09/21 0718)     LOS: 0 days    Time spent: 35 mins    Adaisha Campise, MD [facility]   If 7PM-7AM, please contact night-coverage

## 2021-03-10 DIAGNOSIS — M25561 Pain in right knee: Secondary | ICD-10-CM | POA: Diagnosis not present

## 2021-03-10 DIAGNOSIS — G8929 Other chronic pain: Secondary | ICD-10-CM | POA: Diagnosis not present

## 2021-03-10 LAB — BASIC METABOLIC PANEL
Anion gap: 6 (ref 5–15)
BUN: 24 mg/dL — ABNORMAL HIGH (ref 8–23)
CO2: 24 mmol/L (ref 22–32)
Calcium: 8.7 mg/dL — ABNORMAL LOW (ref 8.9–10.3)
Chloride: 108 mmol/L (ref 98–111)
Creatinine, Ser: 0.72 mg/dL (ref 0.61–1.24)
GFR, Estimated: >60 mL/min (ref >60)
Glucose, Bld: 101 mg/dL — ABNORMAL HIGH (ref 70–99)
Potassium: 4 mmol/L (ref 3.5–5.1)
Sodium: 138 mmol/L (ref 135–145)

## 2021-03-10 LAB — RESP PANEL BY RT-PCR (FLU A&B, COVID) ARPGX2
Influenza A by PCR: NEGATIVE
Influenza B by PCR: NEGATIVE
SARS Coronavirus 2 by RT PCR: NEGATIVE

## 2021-03-10 LAB — CBC
HCT: 33 % — ABNORMAL LOW (ref 39.0–52.0)
Hemoglobin: 10.8 g/dL — ABNORMAL LOW (ref 13.0–17.0)
MCH: 31.6 pg (ref 26.0–34.0)
MCHC: 32.7 g/dL (ref 30.0–36.0)
MCV: 96.5 fL (ref 80.0–100.0)
Platelets: 238 10*3/uL (ref 150–400)
RBC: 3.42 MIL/uL — ABNORMAL LOW (ref 4.22–5.81)
RDW: 13.4 % (ref 11.5–15.5)
WBC: 8.2 10*3/uL (ref 4.0–10.5)
nRBC: 0 % (ref 0.0–0.2)

## 2021-03-10 NOTE — Progress Notes (Signed)
PROGRESS NOTE    Kristopher Gay  QPY:[id number] DOB: 1951/05/07 DOA: 03/08/2021 PCP: [name], MD   Brief Narrative: This 70 years old male with PMH significant for A. fib, hypertension, hyperlipidemia presents in the ED with right knee pain.  Patient reports He recently underwent  right quadriceps tendon repair and he was sent to  skilled nursing facility for rehabilitation after his procedure.  He reports having an overall difficulty with physical therapy.He found himself having sharp pain in the right knee which are fairly constant and worsened with movement.  He has tried rest , ice packs, hydrocodone, Robaxin and Ultram that did not completely resolve his pain.  Last night his pain got to a point where he could not tolerate,  He requested transfer to the ED. Patient was seen by orthopedics,  recommended nonweightbearing.  Patient is cleared from Ortho to be discharged back to skilled nursing facility.  Assessment & Plan:   Active Problems:   Right knee pain  Right knee pain:  Patient recently underwent right quadriceps repair last week. Patient was discharged to skilled nursing facility for rehabilitation. Patient continued to have constant pain despite being on pain meds. Xray Right Knee: Subcutaneus soft tissue edema and emphysema overlying the anterior distal femur concerning for infection/inflammation Orthopedics was consulted,  recommending no further imaging at this point. Splint was replaced, advised to continue antibiotics. Continue DVT prophylaxis. Patient had fever 100.5 on admission.  No leukocytosis.  UA unremarkable. Knee was believed to be source initially.   Patient can be discharged back to skilled nursing facility from Ortho standpoint.   Hypertension: Continue metoprolol.  Atrial Fibrillation ? Patient report having an episode of atrial fibrillation in the past but not actively treated. EKG shows normal sinus rhythm with sinus arrhythmia.  Hyperlipidemia:   continue home statin.  Lung nodules : Patient needs outpatient imaging. Denies any shortness of breath.    DVT prophylaxis:  Lovenox. Code Status:Full code. Family Communication:  No family at bedside. Disposition Plan:   Status is: Inpatient  Remains inpatient appropriate because:Inpatient level of care appropriate due to severity of illness   Dispo: The patient is from: SNF              Anticipated d/c is to: SNF on 5/16              Patient currently is medically stable to d/c.   Difficult to place patient No   Consultants:   Orthopedics  Procedures: S/p right quadriceps tendon repair last week. Antimicrobials:  Anti-infectives (From admission, onward)   Start     Dose/Rate Route Frequency Ordered Stop   03/08/21 1800  vancomycin (VANCOREADY) IVPB 1250 mg/250 mL        1,250 mg 166.7 mL/hr over 90 Minutes Intravenous Every 12 hours 03/08/21 1109     03/08/21 1200  Ampicillin-Sulbactam (UNASYN) 3 g in sodium chloride 0.9 % 100 mL IVPB        3 g 200 mL/hr over 30 Minutes Intravenous Every 6 hours 03/08/21 1109     03/08/21 0345  piperacillin-tazobactam (ZOSYN) IVPB 3.375 g        3.375 g 100 mL/hr over 30 Minutes Intravenous  Once 03/08/21 0336 03/08/21 0432   03/08/21 0345  vancomycin (VANCOREADY) IVPB 2000 mg/400 mL        2,000 mg 200 mL/hr over 120 Minutes Intravenous  Once 03/08/21 0344 03/08/21 1245      Subjective: Patient was seen and examined at  bedside.  Overnight events noted.   Patient reports feeling better, Knee pain has improved,  Objective: Vitals:   03/08/21 2201 03/09/21 0756 03/10/21 0521 03/10/21 0847  BP: 121/69  (!) 110/55   Pulse: 70  (!) 58   Resp: 16  18   Temp: 98.5 F (36.9 C)  97.9 F (36.6 C)   TempSrc:      SpO2: 95% 92% 94% 95%    Intake/Output Summary (Last 24 hours) at 03/10/2021 1330 Last data filed at 03/10/2021 0844 Gross per 24 hour  Intake 2514.65 ml  Output 325 ml  Net 2189.65 ml   There were no vitals filed  for this visit.  Examination:  General exam: Appears calm and comfortable, not in any acute distress. Respiratory system: Clear to auscultation. Respiratory effort normal. Cardiovascular system: S1 & S2 heard, RRR. No JVD, murmurs, rubs, gallops or clicks. No pedal edema. Gastrointestinal system: Abdomen is nondistended, soft and nontender. No organomegaly or masses felt. Normal bowel sounds heard. Central nervous system: Alert and oriented. No focal neurological deficits. Extremities: Right lower extremity in cast and bandage.   Mild tenderness noted. Skin: No rashes, lesions or ulcers Psychiatry: Judgement and insight appear normal. Mood & affect appropriate.     Data Reviewed: I have personally reviewed following labs and imaging studies  CBC: Recent Labs  Lab 03/08/21 0216 03/09/21 0318 03/10/21 0420  WBC 8.8 8.3 8.2  NEUTROABS 7.0  --   --   HGB 12.7* 11.5* 10.8*  HCT 36.9* 35.7* 33.0*  MCV 93.2 98.1 96.5  PLT 237 228 782   Basic Metabolic Panel: Recent Labs  Lab 03/08/21 0216 03/09/21 0318 03/10/21 0420  NA 136 138 138  K 4.2 4.0 4.0  CL 105 105 108  CO2 25 25 24   GLUCOSE 114* 96 101*  BUN 19 22 24*  CREATININE 0.83 0.90 0.72  CALCIUM 9.2 8.9 8.7*   GFR: Estimated Creatinine Clearance: 116.7 mL/min (by C-G formula based on SCr of 0.72 mg/dL). Liver Function Tests: Recent Labs  Lab 03/09/21 0318  AST 22  ALT 24  ALKPHOS 62  BILITOT 1.1  PROT 6.7  ALBUMIN 3.3*   No results for input(s): LIPASE, AMYLASE in the last 168 hours. No results for input(s): AMMONIA in the last 168 hours. Coagulation Profile: No results for input(s): INR, PROTIME in the last 168 hours. Cardiac Enzymes: No results for input(s): CKTOTAL, CKMB, CKMBINDEX, TROPONINI in the last 168 hours. BNP (last 3 results) No results for input(s): PROBNP in the last 8760 hours. HbA1C: No results for input(s): HGBA1C in the last 72 hours. CBG: No results for input(s): GLUCAP in the last  168 hours. Lipid Profile: No results for input(s): CHOL, HDL, LDLCALC, TRIG, CHOLHDL, LDLDIRECT in the last 72 hours. Thyroid Function Tests: No results for input(s): TSH, T4TOTAL, FREET4, T3FREE, THYROIDAB in the last 72 hours. Anemia Panel: No results for input(s): VITAMINB12, FOLATE, FERRITIN, TIBC, IRON, RETICCTPCT in the last 72 hours. Sepsis Labs: No results for input(s): PROCALCITON, LATICACIDVEN in the last 168 hours.  Recent Results (from the past 240 hour(s))  Resp Panel by RT-PCR (Flu A&B, Covid) Nasopharyngeal Swab     Status: None   Collection Time: 03/08/21  3:49 AM   Specimen: Nasopharyngeal Swab; Nasopharyngeal(NP) swabs in vial transport medium  Result Value Ref Range Status   SARS Coronavirus 2 by RT PCR NEGATIVE NEGATIVE Final    Comment: (NOTE) SARS-CoV-2 target nucleic acids are NOT DETECTED.  The SARS-CoV-2 RNA is  generally detectable in upper respiratory specimens during the acute phase of infection. The lowest concentration of SARS-CoV-2 viral copies this assay can detect is 138 copies/mL. A negative result does not preclude SARS-Cov-2 infection and should not be used as the sole basis for treatment or other patient management decisions. A negative result may occur with  improper specimen collection/handling, submission of specimen other than nasopharyngeal swab, presence of viral mutation(s) within the areas targeted by this assay, and inadequate number of viral copies(<138 copies/mL). A negative result must be combined with clinical observations, patient history, and epidemiological information. The expected result is Negative.  Fact Sheet for Patients:  EntrepreneurPulse.com.au  Fact Sheet for Healthcare Providers:  IncredibleEmployment.be  This test is no t yet approved or cleared by the [place] FDA and  has been authorized for detection and/or diagnosis of SARS-CoV-2 by FDA under an Emergency Use  Authorization (EUA). This EUA will remain  in effect (meaning this test can be used) for the duration of the COVID-19 declaration under Section 564(b)(1) of the Act, 21 U.S.C.section 360bbb-3(b)(1), unless the authorization is terminated  or revoked sooner.       Influenza A by PCR NEGATIVE NEGATIVE Final   Influenza B by PCR NEGATIVE NEGATIVE Final    Comment: (NOTE) The Xpert Xpress SARS-CoV-2/FLU/RSV plus assay is intended as an aid in the diagnosis of influenza from Nasopharyngeal swab specimens and should not be used as a sole basis for treatment. Nasal washings and aspirates are unacceptable for Xpert Xpress SARS-CoV-2/FLU/RSV testing.  Fact Sheet for Patients: EntrepreneurPulse.com.au  Fact Sheet for Healthcare Providers: IncredibleEmployment.be  This test is not yet approved or cleared by the [place] FDA and has been authorized for detection and/or diagnosis of SARS-CoV-2 by FDA under an Emergency Use Authorization (EUA). This EUA will remain in effect (meaning this test can be used) for the duration of the COVID-19 declaration under Section 564(b)(1) of the Act, 21 U.S.C. section 360bbb-3(b)(1), unless the authorization is terminated or revoked.  Performed at [facility], [place] [address], [place], [place] [address]      Radiology Studies: No results found.  Scheduled Meds: . ascorbic acid  2,000 mg Oral Daily  . atorvastatin  40 mg Oral Daily  . azelastine  1 spray Each Nare BID   And  . fluticasone  1 spray Each Nare BID  . calcium-vitamin D  1 tablet Oral BID  . celecoxib  100 mg Oral BID  . cholecalciferol  2,000 Units Oral BID  . fluticasone furoate-vilanterol  1 puff Inhalation Daily   And  . umeclidinium bromide  1 puff Inhalation Daily  . furosemide  20 mg Oral Daily  . gabapentin  600 mg Oral BID   And  . gabapentin  300 mg Oral q1600  . metoprolol succinate  25 mg Oral Daily  .  montelukast  10 mg Oral Daily  . multivitamin with minerals  1 tablet Oral BID  . omega-3 acid ethyl esters  1 g Oral Daily  . rivaroxaban  10 mg Oral Daily  . cyanocobalamin  1,000 mcg Oral Daily   Continuous Infusions: . ampicillin-sulbactam (UNASYN) IV 3 g (03/10/21 1330)  . vancomycin 1,250 mg (03/10/21 0615)     LOS: 1 day    Time spent: 25 mins    Kendrick Remigio, MD [facility]   If 7PM-7AM, please contact night-coverage

## 2021-03-10 NOTE — Plan of Care (Signed)
  Problem: Safety: Goal: Ability to remain free from injury will improve Outcome: Progressing   Problem: Pain Managment: Goal: General experience of comfort will improve Outcome: Progressing   Problem: Coping: Goal: Level of anxiety will decrease Outcome: Progressing   

## 2021-03-10 NOTE — TOC Initial Note (Addendum)
Transition of Care [facility]) - Initial/Assessment Note    Patient Details  Name: Kristopher Gay MRN: [id number] Date of Birth: 03-21-1951  Transition of Care [facility]) CM/SW Contact:    Elliot Gurney [place], [place] Phone Number: [phone] 03/10/2021, 9:46 AM  Clinical Narrative:                 Patient is a 69 y.o. male with medical history significant of a fib, HTN, HLD. Presenting with right knee pain. Patient recently discharged to [facility]. Patient ready to return, phone call placed to [facility], intake coordinator. Voicemail message x2 left for a return call regarding patient's return.  1:29pm Return call from [place], patient able to return to [place], however will need authorization before his arrival. Authorization started.  Transition of Care to continue to follow.  [address] LCSW Transition of Care [phone]   Expected Discharge Plan: Skilled Nursing Facility Barriers to Discharge: Other (comment) (pending final approval from facilty regarding patient's return to [facility] SNF)   Patient Goals and CMS Choice        Expected Discharge Plan and Services Expected Discharge Plan: [place] In-house Referral: Clinical Social Work   Post Acute Care Choice: [place] Living arrangements for the past 2 months: [place] Expected Discharge Date:  (unknown)                                    Prior Living Arrangements/Services Living arrangements for the past 2 months: Single Family Home Lives with:: Self Patient language and need for interpreter reviewed:: Yes Do you feel safe going back to the place where you live?: Yes      Need for Family Participation in Patient Care: No (Comment) Care giver support system in place?: No (comment)   Criminal Activity/Legal Involvement Pertinent to Current Situation/Hospitalization: No - Comment as needed  Activities of Daily Living Home Assistive Devices/Equipment:  Eyeglasses,Cane (specify quad or straight),Walker (specify type),Other (Comment),Brace (specify type) (single point cane, front wheeled walker, knee immobilizer, brace for left foot drop) ADL Screening (condition at time of admission) Patient's cognitive ability adequate to safely complete daily activities?: Yes Is the patient deaf or have difficulty hearing?: No Does the patient have difficulty seeing, even when wearing glasses/contacts?: No Does the patient have difficulty concentrating, remembering, or making decisions?: No Patient able to express need for assistance with ADLs?: Yes Does the patient have difficulty dressing or bathing?: Yes Independently performs ADLs?: No Communication: Independent Dressing (OT): Needs assistance Is this a change from baseline?: Pre-admission baseline Grooming: Independent Feeding: Independent Bathing: Needs assistance Is this a change from baseline?: Pre-admission baseline Toileting: Needs assistance Is this a change from baseline?: Pre-admission baseline In/Out Bed: Needs assistance Is this a change from baseline?: Pre-admission baseline Walks in Home: Needs assistance Is this a change from baseline?: Pre-admission baseline Does the patient have difficulty walking or climbing stairs?: Yes (secondary to right knee weakness) Weakness of Legs: Right (left foot drop) Weakness of Arms/Hands: None  Permission Sought/Granted   Permission granted to share information with : Yes, Verbal Permission Granted     Permission granted to share info w AGENCY: [facility]        Emotional Assessment         Alcohol / Substance Use: Not Applicable Psych Involvement: No (comment)  Admission diagnosis:  Lung nodule [R91.1] Right knee pain [M25.561] Acute pain of right knee [M25.561] Fever,  unspecified fever cause [R50.9] Patient Active Problem List   Diagnosis Date Noted  . Rupture of quadriceps tendon, right, sequela 02/26/2021  . Rupture of  right quadriceps tendon 08/07/2020  . Ulnar neuropathy at elbow, right 05/03/2018  . Neuritis of right ulnar nerve 04/27/2018  . PAF (paroxysmal atrial fibrillation) ([place]) 02/20/2018  . Chronic right shoulder pain 05/15/2016  . Right knee pain 05/15/2016  . Adrenal adenoma 02/20/2016  . Chronic pain syndrome 10/12/2015  . Dysphagia, pharyngoesophageal phase 09/14/2014  . Other chronic postoperative pain 01/16/2014  . Allergy, insect bite 07/02/2012  . GERD (gastroesophageal reflux disease) 03/25/2012  . 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 Lumbosacral spondylosis 01/13/2012  . Chronic back pain 11/17/2011  . Seasonal and perennial allergic rhinitis 12/26/2010  . Chronic pain 04/16/2009  . COPD (chronic obstructive pulmonary disease) ([place]) 01/30/2008   PCP:  [name], MD Pharmacy:   [facility] [place], [place] - [address] AT [place] [address] Phone: [phone] Fax: [phone]  [facility] #[id number] [place], [place] - [address] AT [place] [address] [place] [address] Phone: [phone] Fax: [phone]     Social Determinants of Health (SDOH) Interventions    Readmission Risk Interventions No flowsheet data found.

## 2021-03-11 DIAGNOSIS — G8929 Other chronic pain: Secondary | ICD-10-CM | POA: Diagnosis not present

## 2021-03-11 DIAGNOSIS — M25561 Pain in right knee: Secondary | ICD-10-CM | POA: Diagnosis not present

## 2021-03-11 LAB — HEMOGLOBIN AND HEMATOCRIT, BLOOD
HCT: 32.2 % — ABNORMAL LOW (ref 39.0–52.0)
Hemoglobin: 10.6 g/dL — ABNORMAL LOW (ref 13.0–17.0)

## 2021-03-11 NOTE — Progress Notes (Signed)
PT received [place] and [place] at [phone]. Currently is Read Only on [facility]- RN aware.

## 2021-03-11 NOTE — Progress Notes (Addendum)
PROGRESS NOTE    Kristopher Gay  XHB:[id number] DOB: [birth year] DOA: 03/08/2021 PCP: [name], MD   Brief Narrative: This 70 years old male with PMH significant for A. fib, hypertension, hyperlipidemia presents in the ED with right knee pain.  Patient reports He recently underwent  right quadriceps tendon repair and he was sent to  skilled nursing facility for rehabilitation after his procedure.  He reports having an overall difficulty with physical therapy.He found himself having sharp pain in the right knee which are fairly constant and worsened with movement.  He has tried rest , ice packs, hydrocodone, Robaxin and Ultram that did not completely resolve his pain.  Last night his pain got to a point where he could not tolerate,  He requested transfer to the ED. Patient was seen by orthopedics,  recommended nonweightbearing.  Patient is cleared from Ortho to be discharged back to skilled nursing facility.  Assessment & Plan:   Active Problems:   Right knee pain  Right knee pain:  Patient recently underwent right quadriceps repair last week. Patient was discharged to skilled nursing facility for rehabilitation. Patient continued to have constant pain despite being on pain meds. Xray Right Knee: Subcutaneus soft tissue edema and emphysema overlying the anterior distal femur concerning for infection/inflammation Orthopedics was consulted,  recommending no further imaging at this point. Splint was replaced, advised to continue antibiotics. Continue DVT prophylaxis. Patient had fever 100.5 on admission.  No leukocytosis.  UA unremarkable. Knee was believed to be source initially.   Patient remained afebrile, antibiotics discontinued today. Patient can be discharged back to skilled nursing facility from Ortho standpoint. Patient is having expected postoperative inflammatory changes. Insurance authorization initiated.  PT reassessment pending  Hypertension: Continue metoprolol.  Atrial  Fibrillation ? Patient reports  having an episode of atrial fibrillation in the past but not actively treated. EKG shows normal sinus rhythm with sinus arrhythmia.  No documentation.  Hyperlipidemia:  continue home statin.  Lung nodules : Patient needs outpatient imaging. Denies any shortness of breath.    DVT prophylaxis:  Lovenox. Code Status:Full code. Family Communication:  No family at bedside. Disposition Plan:   Status is: Inpatient  Remains inpatient appropriate because:Inpatient level of care appropriate due to severity of illness   Dispo: The patient is from: SNF              Anticipated d/c is to: SNF on 5/16 or 5/17.              Patient currently is medically stable to d/c.   Difficult to place patient No   Consultants:   Orthopedics  Procedures: S/p right quadriceps tendon repair last week. Antimicrobials:  Anti-infectives (From admission, onward)   Start     Dose/Rate Route Frequency Ordered Stop   03/08/21 1800  vancomycin (VANCOREADY) IVPB 1250 mg/250 mL  Status:  Discontinued        1,250 mg 166.7 mL/hr over 90 Minutes Intravenous Every 12 hours 03/08/21 1109 03/11/21 1035   03/08/21 1200  Ampicillin-Sulbactam (UNASYN) 3 g in sodium chloride 0.9 % 100 mL IVPB  Status:  Discontinued        3 g 200 mL/hr over 30 Minutes Intravenous Every 6 hours 03/08/21 1109 03/11/21 1035   03/08/21 0345  piperacillin-tazobactam (ZOSYN) IVPB 3.375 g        3.375 g 100 mL/hr over 30 Minutes Intravenous  Once 03/08/21 0336 03/08/21 0432   03/08/21 0345  vancomycin (VANCOREADY) IVPB 2000 mg/400 mL  2,000 mg 200 mL/hr over 120 Minutes Intravenous  Once 03/08/21 0344 03/08/21 [id number]      Subjective: Patient was seen and examined at bedside.  Overnight events noted.   Patient reports feeling better, Knee pain has improved, he is able to put some weight on the leg.  Objective: Vitals:   03/10/21 0847 03/10/21 1338 03/11/21 0941 03/11/21 0942  BP:  122/74     Pulse:  68    Resp:  20    Temp:  98.1 F (36.7 C)    TempSrc:  Oral    SpO2: 95% 93% (!) 87% 93%    Intake/Output Summary (Last 24 hours) at 03/11/2021 1207 Last data filed at 03/11/2021 0801 Gross per 24 hour  Intake 1589.99 ml  Output 602 ml  Net 987.99 ml   There were no vitals filed for this visit.  Examination:  General exam: Appears calm and comfortable, not in any acute distress. Respiratory system: Clear to auscultation. Respiratory effort normal. Cardiovascular system: S1 & S2 heard, RRR. No JVD, murmurs, rubs, gallops or clicks. No pedal edema. Gastrointestinal system: Abdomen is nondistended, soft and nontender. No organomegaly or masses felt. Normal bowel sounds heard. Central nervous system: Alert and oriented. No focal neurological deficits. Extremities: Right lower extremity in cast and bandage.   Mild tenderness noted. Skin: No rashes, lesions or ulcers Psychiatry: Judgement and insight appear normal. Mood & affect appropriate.     Data Reviewed: I have personally reviewed following labs and imaging studies  CBC: Recent Labs  Lab 03/08/21 0216 03/09/21 0318 03/10/21 0420 03/11/21 0320  WBC 8.8 8.3 8.2  --   NEUTROABS 7.0  --   --   --   HGB 12.7* 11.5* 10.8* 10.6*  HCT 36.9* 35.7* 33.0* 32.2*  MCV 93.2 98.1 96.5  --   PLT 237 228 238  --    Basic Metabolic Panel: Recent Labs  Lab 03/08/21 0216 03/09/21 0318 03/10/21 0420  NA 136 138 138  K 4.2 4.0 4.0  CL 105 105 108  CO2 25 25 24   GLUCOSE 114* 96 101*  BUN 19 22 24*  CREATININE 0.83 0.90 0.72  CALCIUM 9.2 8.9 8.7*   GFR: Estimated Creatinine Clearance: 116.7 mL/min (by C-G formula based on SCr of 0.72 mg/dL). Liver Function Tests: Recent Labs  Lab 03/09/21 0318  AST 22  ALT 24  ALKPHOS 62  BILITOT 1.1  PROT 6.7  ALBUMIN 3.3*   No results for input(s): LIPASE, AMYLASE in the last 168 hours. No results for input(s): AMMONIA in the last 168 hours. Coagulation Profile: No  results for input(s): INR, PROTIME in the last 168 hours. Cardiac Enzymes: No results for input(s): CKTOTAL, CKMB, CKMBINDEX, TROPONINI in the last 168 hours. BNP (last 3 results) No results for input(s): PROBNP in the last 8760 hours. HbA1C: No results for input(s): HGBA1C in the last 72 hours. CBG: No results for input(s): GLUCAP in the last 168 hours. Lipid Profile: No results for input(s): CHOL, HDL, LDLCALC, TRIG, CHOLHDL, LDLDIRECT in the last 72 hours. Thyroid Function Tests: No results for input(s): TSH, T4TOTAL, FREET4, T3FREE, THYROIDAB in the last 72 hours. Anemia Panel: No results for input(s): VITAMINB12, FOLATE, FERRITIN, TIBC, IRON, RETICCTPCT in the last 72 hours. Sepsis Labs: No results for input(s): PROCALCITON, LATICACIDVEN in the last 168 hours.  Recent Results (from the past 240 hour(s))  Resp Panel by RT-PCR (Flu A&B, Covid) Nasopharyngeal Swab     Status: None   Collection Time: 03/08/21  3:49 AM   Specimen: Nasopharyngeal Swab; Nasopharyngeal(NP) swabs in vial transport medium  Result Value Ref Range Status   SARS Coronavirus 2 by RT PCR NEGATIVE NEGATIVE Final    Comment: (NOTE) SARS-CoV-2 target nucleic acids are NOT DETECTED.  The SARS-CoV-2 RNA is generally detectable in upper respiratory specimens during the acute phase of infection. The lowest concentration of SARS-CoV-2 viral copies this assay can detect is 138 copies/mL. A negative result does not preclude SARS-Cov-2 infection and should not be used as the sole basis for treatment or other patient management decisions. A negative result may occur with  improper specimen collection/handling, submission of specimen other than nasopharyngeal swab, presence of viral mutation(s) within the areas targeted by this assay, and inadequate number of viral copies(<138 copies/mL). A negative result must be combined with clinical observations, patient history, and epidemiological information. The expected  result is Negative.  Fact Sheet for Patients:  EntrepreneurPulse.com.au  Fact Sheet for Healthcare Providers:  IncredibleEmployment.be  This test is no t yet approved or cleared by the [place] FDA and  has been authorized for detection and/or diagnosis of SARS-CoV-2 by FDA under an Emergency Use Authorization (EUA). This EUA will remain  in effect (meaning this test can be used) for the duration of the COVID-19 declaration under Section 564(b)(1) of the Act, 21 U.S.C.section 360bbb-3(b)(1), unless the authorization is terminated  or revoked sooner.       Influenza A by PCR NEGATIVE NEGATIVE Final   Influenza B by PCR NEGATIVE NEGATIVE Final    Comment: (NOTE) The Xpert Xpress SARS-CoV-2/FLU/RSV plus assay is intended as an aid in the diagnosis of influenza from Nasopharyngeal swab specimens and should not be used as a sole basis for treatment. Nasal washings and aspirates are unacceptable for Xpert Xpress SARS-CoV-2/FLU/RSV testing.  Fact Sheet for Patients: EntrepreneurPulse.com.au  Fact Sheet for Healthcare Providers: IncredibleEmployment.be  This test is not yet approved or cleared by the [place] FDA and has been authorized for detection and/or diagnosis of SARS-CoV-2 by FDA under an Emergency Use Authorization (EUA). This EUA will remain in effect (meaning this test can be used) for the duration of the COVID-19 declaration under Section 564(b)(1) of the Act, 21 U.S.C. section 360bbb-3(b)(1), unless the authorization is terminated or revoked.  Performed at [facility], [place] [address] [place], [place] [address]   Resp Panel by RT-PCR (Flu A&B, Covid) Nasopharyngeal Swab     Status: None   Collection Time: 03/10/21  1:48 PM   Specimen: Nasopharyngeal Swab; Nasopharyngeal(NP) swabs in vial transport medium  Result Value Ref Range Status   SARS Coronavirus 2 by RT PCR  NEGATIVE NEGATIVE Final    Comment: (NOTE) SARS-CoV-2 target nucleic acids are NOT DETECTED.  The SARS-CoV-2 RNA is generally detectable in upper respiratory specimens during the acute phase of infection. The lowest concentration of SARS-CoV-2 viral copies this assay can detect is 138 copies/mL. A negative result does not preclude SARS-Cov-2 infection and should not be used as the sole basis for treatment or other patient management decisions. A negative result may occur with  improper specimen collection/handling, submission of specimen other than nasopharyngeal swab, presence of viral mutation(s) within the areas targeted by this assay, and inadequate number of viral copies(<138 copies/mL). A negative result must be combined with clinical observations, patient history, and epidemiological information. The expected result is Negative.  Fact Sheet for Patients:  EntrepreneurPulse.com.au  Fact Sheet for Healthcare Providers:  IncredibleEmployment.be  This test is no t yet  approved or cleared by the [place] and  has been authorized for detection and/or diagnosis of SARS-CoV-2 by FDA under an Emergency Use Authorization (EUA). This EUA will remain  in effect (meaning this test can be used) for the duration of the COVID-19 declaration under Section 564(b)(1) of the Act, 21 U.S.C.section 360bbb-3(b)(1), unless the authorization is terminated  or revoked sooner.       Influenza A by PCR NEGATIVE NEGATIVE Final   Influenza B by PCR NEGATIVE NEGATIVE Final    Comment: (NOTE) The Xpert Xpress SARS-CoV-2/FLU/RSV plus assay is intended as an aid in the diagnosis of influenza from Nasopharyngeal swab specimens and should not be used as a sole basis for treatment. Nasal washings and aspirates are unacceptable for Xpert Xpress SARS-CoV-2/FLU/RSV testing.  Fact Sheet for Patients: EntrepreneurPulse.com.au  Fact Sheet for  Healthcare Providers: IncredibleEmployment.be  This test is not yet approved or cleared by the [place] FDA and has been authorized for detection and/or diagnosis of SARS-CoV-2 by FDA under an Emergency Use Authorization (EUA). This EUA will remain in effect (meaning this test can be used) for the duration of the COVID-19 declaration under Section 564(b)(1) of the Act, 21 U.S.C. section 360bbb-3(b)(1), unless the authorization is terminated or revoked.  Performed at [facility], [place] [address], [place], [place] [address]      Radiology Studies: No results found.  Scheduled Meds: . ascorbic acid  2,000 mg Oral Daily  . atorvastatin  40 mg Oral Daily  . azelastine  1 spray Each Nare BID   And  . fluticasone  1 spray Each Nare BID  . calcium-vitamin D  1 tablet Oral BID  . celecoxib  100 mg Oral BID  . cholecalciferol  2,000 Units Oral BID  . fluticasone furoate-vilanterol  1 puff Inhalation Daily   And  . umeclidinium bromide  1 puff Inhalation Daily  . furosemide  20 mg Oral Daily  . gabapentin  600 mg Oral BID   And  . gabapentin  300 mg Oral q1600  . metoprolol succinate  25 mg Oral Daily  . montelukast  10 mg Oral Daily  . multivitamin with minerals  1 tablet Oral BID  . omega-3 acid ethyl esters  1 g Oral Daily  . rivaroxaban  10 mg Oral Daily  . cyanocobalamin  1,000 mcg Oral Daily   Continuous Infusions:    LOS: 2 days    Time spent: 25 mins    Shawna Clamp, MD [facility]   If 7PM-7AM, please contact night-coverage

## 2021-03-11 NOTE — TOC Progression Note (Signed)
Transition of Care [facility]) - Progression Note   Patient Details  Name: Kristopher Gay MRN: [id number] Date of Birth: August 04, 1951  Transition [facility] Care [facility]) CM/SW [place], LCSW Phone Number: 03/11/2021, 3:27 PM  Clinical Narrative: CSW uploaded PT and OT notes to [place] to complete insurance authorization. Patient has been approved. Patient now expected to discharge back to SNF tomorrow per hospitalist. [place] updated [place] with [facility]. TOC to follow.  Expected Discharge Plan: Skilled Nursing Facility Barriers to Discharge: Other (comment) (pending final approval from facilty regarding patient's return to [facility] SNF)  Expected Discharge Plan and Services Expected Discharge Plan: [place] In-house Referral: Clinical Social Work Post Acute Care Choice: [employer] Living arrangements for the past 2 months: Single Family Home Expected Discharge Date:  (unknown)                Readmission Risk Interventions No flowsheet data found.

## 2021-03-11 NOTE — Evaluation (Signed)
Occupational Therapy Evaluation and Discharge Patient Details Name: Kristopher Gay MRN: [id number] DOB: [birth year] Today's Date: 03/11/2021    History of Present Illness Patient is a 70 year old male s/p quad tendon repair on 02/26/21. History includes original repair 10/21, HTN, A-fib, L drop foot   Clinical Impression   This 70 yo male admitted with above presents to acute OT with PLOF of needing A for basic ADLs and transfers and continues to need A for these tasks with the ability to progress to a Mod I level with DME and AE. Will defer remainder of OT back to SNF, we will sign off.    Follow Up Recommendations  SNF;Supervision/Assistance - 24 hour    Equipment Recommendations  Other (comment) (TBD next venue)       Precautions / Restrictions Precautions Precautions: Fall Precaution Comments: no knee flexion Required Braces or Orthoses: Splint/Cast Splint/Cast: R LE Restrictions Weight Bearing Restrictions: Yes RLE Weight Bearing: Non weight bearing Other Position/Activity Restrictions: Pt requiring VCs to maintain NWB for pivoting      Mobility Bed Mobility Overal bed mobility: Needs Assistance Bed Mobility: Supine to Sit;Sit to Supine     Supine to sit: Modified independent (Device/Increase time);HOB elevated Sit to supine: Min assist   General bed mobility comments: A for leg to get back in bed    Transfers Overall transfer level: Needs assistance Equipment used: Rolling walker (2 wheeled) Transfers: Sit to/from [facility] Sit to Stand: Min guard Stand pivot transfers: Min guard            Balance Overall balance assessment: Needs assistance Sitting-balance support: No upper extremity supported;Feet supported Sitting balance-Leahy Scale: Good     Standing balance support: Single extremity supported Standing balance-Leahy Scale: Poor Standing balance comment: reliant on UE support                           ADL either  performed or assessed with clinical judgement   ADL Overall ADL's : Needs assistance/impaired Eating/Feeding: Independent;Bed level   Grooming: Set up;Bed level   Upper Body Bathing: Set up;Bed level   Lower Body Bathing: Total assistance;Bed level   Upper Body Dressing : Set up;Bed level   Lower Body Dressing: Bed level   Toilet Transfer: Min guard;Stand-pivot;RW;BSC   Toileting- Clothing Manipulation and Hygiene: Min guard;Sit to/from stand               Vision Patient Visual Report: No change from baseline              Pertinent Vitals/Pain Pain Assessment: Faces Faces Pain Scale: Hurts even more Pain Location: RLE (at rest and with movement) Pain Descriptors / Indicators: Sore;Discomfort Pain Intervention(s): Limited activity within patient's tolerance;Monitored during session;Repositioned     Hand Dominance Right   Extremity/Trunk Assessment Upper Extremity Assessment Upper Extremity Assessment: Overall WFL for tasks assessed           Communication Communication Communication: No difficulties   Cognition Arousal/Alertness: Awake/alert Behavior During Therapy: WFL for tasks assessed/performed Overall Cognitive Status: Within Functional Limits for tasks assessed                                 General Comments: likes to talk, able to self direct care              Home Living Family/patient expects to be discharged to:: Skilled  nursing facility                                        Prior Functioning/Environment Level of Independence: Needs assistance  Gait / Transfers Assistance Needed: ambulating with walker (hopping) ADL's / Homemaking Assistance Needed: Mod A for basic ADLs (he could do UB but not LB)            OT Problem List: Decreased range of motion;Impaired balance (sitting and/or standing);Pain         OT Goals(Current goals can be found in the care plan section) Acute Rehab OT Goals Patient  Stated Goal: to go back to rehab then home  OT Frequency: Min 2X/week   Barriers to D/C: Decreased caregiver support          Co-evaluation PT/OT/SLP Co-Evaluation/Treatment: Yes Reason for Co-Treatment: To address functional/ADL transfers;For patient/therapist safety PT goals addressed during session: Mobility/safety with mobility;Balance;Proper use of DME;Strengthening/ROM OT goals addressed during session: Strengthening/ROM;ADL's and self-care      AM-PAC OT "6 Clicks" Daily Activity     Outcome Measure Help from another person eating meals?: None Help from another person taking care of personal grooming?: A Little Help from another person toileting, which includes using toliet, bedpan, or urinal?: A Little Help from another person bathing (including washing, rinsing, drying)?: A Lot Help from another person to put on and taking off regular upper body clothing?: A Little Help from another person to put on and taking off regular lower body clothing?: A Lot 6 Click Score: 17   End of Session Equipment Utilized During Treatment: Rolling walker  Activity Tolerance: Patient tolerated treatment well Patient left: in bed;with call bell/phone within reach;with bed alarm set  OT Visit Diagnosis: Unsteadiness on feet (R26.81);Muscle weakness (generalized) (M62.81);Pain Pain - Right/Left: Left Pain - part of body: Leg                Time: 0814-4818 OT Time Calculation (min): 24 min Charges:  OT General Charges $OT Visit: 1 Visit OT Evaluation $OT Eval Moderate Complexity: [place], OTR/L Acute [employer] Pager [phone] Office [phone]     Almon Register 03/11/2021, 12:50 PM

## 2021-03-11 NOTE — Evaluation (Signed)
Physical Therapy Evaluation Patient Details Name: Kristopher Gay MRN: [id number] DOB: 1951-02-20 Today's Date: 03/11/2021   History of Present Illness  Patient is a 70 year old male s/p quad tendon repair on 02/26/21. History includes original repair 10/21, HTN, A-fib, L drop foot  Clinical Impression  Pt admitted with above diagnosis.  Recommend SNF post acute to maximize independence and safety for eventual return home   Pt currently with functional limitations due to the deficits listed below (see PT Problem List). Pt will benefit from skilled PT to increase their independence and safety with mobility to allow discharge to the venue listed below.      Follow Up Recommendations SNF    Equipment Recommendations  None recommended by PT    Recommendations for Other Services       Precautions / Restrictions Precautions Precautions: Fall Precaution Comments: no knee flexion Required Braces or Orthoses: Splint/Cast Splint/Cast: R LE Restrictions Weight Bearing Restrictions: Yes RLE Weight Bearing: Non weight bearing Other Position/Activity Restrictions: Pt requiring VCs to maintain NWB for pivoting      Mobility  Bed Mobility Overal bed mobility: Needs Assistance Bed Mobility: Supine to Sit;Sit to Supine     Supine to sit: Min assist Sit to supine: Min assist   General bed mobility comments: A for leg to get back in bed    Transfers Overall transfer level: Needs assistance Equipment used: Rolling walker (2 wheeled) Transfers: Sit to/from [facility] Sit to Stand: Min guard Stand pivot transfers: Min guard       General transfer comment: min/guard for safety. pt prefers to self direct transfer technique  Ambulation/Gait                Stairs            Wheelchair Mobility    Modified Rankin (Stroke Patients Only)       Balance Overall balance assessment: Needs assistance Sitting-balance support: No upper extremity supported;Feet  supported Sitting balance-Leahy Scale: Good     Standing balance support: Single extremity supported Standing balance-Leahy Scale: Poor Standing balance comment: reliant on UE support                             Pertinent Vitals/Pain Pain Assessment: Faces Faces Pain Scale: Hurts even more Pain Location: RLE (at rest and with movement) Pain Descriptors / Indicators: Sore;Discomfort Pain Intervention(s): Limited activity within patient's tolerance;Monitored during session;Repositioned    Home Living Family/patient expects to be discharged to:: Skilled nursing facility Living Arrangements: Alone               Additional Comments: has been at [facility] since 07/09[facility]20    Prior Function Level of Independence: Needs assistance   Gait / Transfers Assistance Needed: ambulating with walker (hopping)  ADL's / Homemaking Assistance Needed: Mod A for basic ADLs (he could do UB but not LB)        Hand Dominance   Dominant Hand: Right    Extremity/Trunk Assessment   Upper Extremity Assessment Upper Extremity Assessment: Overall WFL for tasks assessed    Lower Extremity Assessment Lower Extremity Assessment: RLE deficits/detail RLE Deficits / Details: in long leg splint with heel support/sugar tong ankle splint RLE: Unable to fully assess due to immobilization LLE Deficits / Details: hx left foot drop       Communication   Communication: No difficulties  Cognition Arousal/Alertness: Awake/alert Behavior During Therapy: WFL for tasks assessed/performed Overall  Cognitive Status: Within Functional Limits for tasks assessed                                 General Comments: likes to talk, able to self direct care      General Comments      Exercises     Assessment/Plan    PT Assessment Patient needs continued PT services  PT Problem List Decreased strength;Decreased range of motion;Decreased mobility;Decreased knowledge of  precautions;Pain;Decreased balance;Decreased knowledge of use of DME;Decreased activity tolerance       PT Treatment Interventions Gait training;DME instruction;Therapeutic exercise;Balance training;Functional mobility training;Therapeutic activities;Patient/family education;Wheelchair mobility training    PT Goals (Current goals can be found in the Care Plan section)  Acute Rehab PT Goals Patient Stated Goal: to go back to rehab then home PT Goal Formulation: With patient Time For Goal Achievement: 03/25/21 Potential to Achieve Goals: Good    Frequency Min 2X/week   Barriers to discharge        Co-evaluation PT/OT/SLP Co-Evaluation/Treatment: Yes Reason for Co-Treatment: To address functional/ADL transfers PT goals addressed during session: Mobility/safety with mobility OT goals addressed during session: Strengthening/ROM;ADL's and self-care       AM-PAC PT "6 Clicks" Mobility  Outcome Measure Help needed turning from your back to your side while in a flat bed without using bedrails?: A Little Help needed moving from lying on your back to sitting on the side of a flat bed without using bedrails?: A Little Help needed moving to and from a bed to a chair (including a wheelchair)?: A Little Help needed standing up from a chair using your arms (e.g., wheelchair or bedside chair)?: A Little Help needed to walk in hospital room?: A Lot Help needed climbing 3-5 steps with a railing? : Total 6 Click Score: 15    End of Session   Activity Tolerance: Patient tolerated treatment well Patient left: in bed;with call bell/phone within reach;with bed alarm set   PT Visit Diagnosis: Other abnormalities of gait and mobility (R26.89)    Time: 5409-8119 PT Time Calculation (min) (ACUTE ONLY): 22 min   Charges:   PT Evaluation $PT Eval Low Complexity: [facility], PT  Acute Rehab Dept ([place]) [phone] Pager [phone]  03/11/2021   [facility] 03/11/2021,  1:30 PM

## 2021-03-11 NOTE — Progress Notes (Signed)
PT was 87% on RA- placed on 2 LPM current Sp02 93%- RN aware.

## 2021-03-12 DIAGNOSIS — Z7409 Other reduced mobility: Secondary | ICD-10-CM | POA: Diagnosis not present

## 2021-03-12 DIAGNOSIS — F22 Delusional disorders: Secondary | ICD-10-CM | POA: Diagnosis not present

## 2021-03-12 DIAGNOSIS — E785 Hyperlipidemia, unspecified: Secondary | ICD-10-CM | POA: Diagnosis not present

## 2021-03-12 DIAGNOSIS — M25561 Pain in right knee: Secondary | ICD-10-CM | POA: Diagnosis not present

## 2021-03-12 DIAGNOSIS — F330 Major depressive disorder, recurrent, mild: Secondary | ICD-10-CM | POA: Diagnosis not present

## 2021-03-12 DIAGNOSIS — I739 Peripheral vascular disease, unspecified: Secondary | ICD-10-CM | POA: Diagnosis not present

## 2021-03-12 DIAGNOSIS — I4891 Unspecified atrial fibrillation: Secondary | ICD-10-CM | POA: Diagnosis not present

## 2021-03-12 DIAGNOSIS — J449 Chronic obstructive pulmonary disease, unspecified: Secondary | ICD-10-CM | POA: Diagnosis not present

## 2021-03-12 DIAGNOSIS — G629 Polyneuropathy, unspecified: Secondary | ICD-10-CM | POA: Diagnosis not present

## 2021-03-12 DIAGNOSIS — J439 Emphysema, unspecified: Secondary | ICD-10-CM | POA: Diagnosis not present

## 2021-03-12 DIAGNOSIS — M66251 Spontaneous rupture of extensor tendons, right thigh: Secondary | ICD-10-CM | POA: Diagnosis not present

## 2021-03-12 DIAGNOSIS — Z789 Other specified health status: Secondary | ICD-10-CM | POA: Diagnosis not present

## 2021-03-12 DIAGNOSIS — S76111D Strain of right quadriceps muscle, fascia and tendon, subsequent encounter: Secondary | ICD-10-CM | POA: Diagnosis not present

## 2021-03-12 DIAGNOSIS — G8929 Other chronic pain: Secondary | ICD-10-CM | POA: Diagnosis not present

## 2021-03-12 DIAGNOSIS — I10 Essential (primary) hypertension: Secondary | ICD-10-CM | POA: Diagnosis not present

## 2021-03-12 DIAGNOSIS — M1990 Unspecified osteoarthritis, unspecified site: Secondary | ICD-10-CM | POA: Diagnosis not present

## 2021-03-12 DIAGNOSIS — F429 Obsessive-compulsive disorder, unspecified: Secondary | ICD-10-CM | POA: Diagnosis not present

## 2021-03-12 DIAGNOSIS — F4322 Adjustment disorder with anxiety: Secondary | ICD-10-CM | POA: Diagnosis not present

## 2021-03-12 DIAGNOSIS — M79604 Pain in right leg: Secondary | ICD-10-CM | POA: Diagnosis not present

## 2021-03-12 MED ORDER — RIVAROXABAN 10 MG PO TABS: 10.0000 mg | ORAL_TABLET | Freq: Every day | ORAL | 0 refills | Status: DC

## 2021-03-12 NOTE — Discharge Summary (Addendum)
Physician Discharge Summary  Kristopher Gay TFT:[id number] DOB: 07-09-1951 DOA: 03/08/2021  PCP: [name], MD  Admit date: 03/08/2021   Discharge date: 03/12/2021  Admitted From: SNF  Disposition:  SNF  Recommendations for Outpatient Follow-up:  1. Follow up with PCP in 1-2 weeks. 2. Please obtain BMP/CBC in one week. 3. Advised to follow up [facility] as scheduled. 4. Advised Non weighbearing Right Leg. 5. Needs outpatient pulmonology follow-up for lung nodules.  Home Health: None. Equipment/Devices:None  Discharge Condition: Stable CODE STATUS:Full code Diet recommendation: Heart Healthy   Brief Summary / Hospital course: This 70 years old male with PMH significant for paroxysmal A. fib, hypertension, hyperlipidemia presents in the ED with right knee pain. Patient reports he recently underwent  right quadriceps tendon repair and he was sent to skilled nursing facility for rehabilitation after his procedure.  He reports having an overall difficulty with physical therapy. He found himself having sharp pain in the right knee which are fairly constant and worsened with movement.  He has tried rest , ice packs, hydrocodone, Robaxin and Ultram that did not completely resolve his pain.  Last night his pain got to a point where he could not tolerate,  He requested transfer to the ED. Patient was seen by orthopedics, recommended to continue nonweightbearing.  Patient has normal postoperative findings noted on imaging.  Patient is cleared from Ortho to be discharged back to skilled nursing facility. Patient was admitted for constant right knee pain, Imaging showed subcutaneous soft tissue edema and emphysema overlying anterior distal femur concerning for infection. Patient was started on antibiotics.  Patient was also continued on Xarelto for DVT prophylaxis.  Orthopedics signed off,  recommended patient should be nonweightbearing on right leg and patient can be discharged back to skilled  nursing facility.  Patient had an episode of hypoxia requiring 2 L of supplemental oxygen which was successfully weaned down to room air.  Patient is being discharged to nursing home for rehab. Patient does reports history of A. fib in the past and was started on Eliquis,  been regularly following up with cardiology and was discontinued from Eliquis.  Patient is going to take Xarelto for 30 days for DVT prophylaxis postoperatively.  Patient will follow-up orthopedics in 1 week.  He was managed for below problems during hospitalization.  Discharge Diagnoses:  Active Problems:   Right knee pain  Right knee pain:  Patient recently underwent right quadriceps repair last week. Patient was discharged to skilled nursing facility for rehabilitation. Patient continued to have constant pain despite being on pain meds. Xray Right Knee: Subcutaneus soft tissue edema and emphysema overlying the anterior distal femur concerning for infection/inflammation Orthopedics was consulted, recommending no further imaging at this point. Splint was replaced, advised to continue antibiotics. Continue DVT prophylaxis. Patient had fever 100.5 on admission.  No leukocytosis.  UA unremarkable. Knee was believed to be source initially.   Patient remained afebrile, antibiotics discontinued today 5/16. Patient can be discharged back to skilled nursing facility from Ortho standpoint. Patient is having expected postoperative inflammatory changes. Insurance authorization initiated.  PT reassessment pending  Hypertension: Continue metoprolol.  Atrial Fibrillation ? Patient reports  having an episode of atrial fibrillation in the past but not actively treated. EKG shows normal sinus rhythm with sinus arrhythmia.  No documentation.  Hyperlipidemia:  continue home statin.  Lung nodules : Patient needs outpatient imaging. Denies any shortness of breath.  Discharge Instructions  Discharge Instructions    Call MD  for:  difficulty breathing, headache or visual disturbances   Complete by: As directed    Call MD for:  persistant dizziness or light-headedness   Complete by: As directed    Call MD for:  persistant nausea and vomiting   Complete by: As directed    Diet - low sodium heart healthy   Complete by: As directed    Diet Carb Modified   Complete by: As directed    Discharge instructions   Complete by: As directed    Advised to follow up PCP in one week. Advised to follow up [facility] as scheduled. Advised Non weighbearing Right Leg.   Discharge wound care:   Complete by: As directed    Follow up [facility] as scheduled.   Increase activity slowly   Complete by: As directed      Allergies as of 03/12/2021      Reactions   Bee Venom Swelling   Linzess [linaclotide] Other (See Comments)   Abdominal pain   Molds & Smuts Other (See Comments)   Unknown   Seasonal Ic [cholestatin] Other (See Comments)   Environmental Allergies      Medication List    TAKE these medications   acetaminophen 500 MG tablet Commonly known as: TYLENOL Take 2 tablets (1,000 mg total) by mouth every 6 (six) hours as needed for mild pain or moderate pain.   ascorbic acid 1000 MG tablet Commonly known as: VITAMIN C Take 2,000 mg by mouth daily.   atorvastatin 40 MG tablet Commonly known as: LIPITOR Take 40 mg by mouth daily.   Azelastine-Fluticasone 137-50 MCG/ACT Susp Place 1 spray into the nose as needed. What changed: when to take this   CALCIUM 1200 PO Take 1 tablet by mouth in the morning and at bedtime.   celecoxib 100 MG capsule Commonly known as: CELEBREX Take 100 mg by mouth in the morning and at bedtime.   cyanocobalamin 1000 MCG tablet Take 1,000 mcg by mouth in the morning and at bedtime.   DHEA 25 MG Caps Take 25 mg by mouth daily.   docusate sodium 100 MG capsule Commonly known as: COLACE Take 1 capsule (100 mg total) by mouth 2 (two) times daily as needed for mild  constipation.   EPINEPHrine 0.3 mg/0.3 mL Soaj injection Commonly known as: EPI-PEN Inject 0.3 mg into the muscle as needed for anaphylaxis.   Fish Oil 1200 MG Caps Take 1,200 mg by mouth in the morning and at bedtime.   furosemide 20 MG tablet Commonly known as: LASIX TAKE 1 TABLET(20 MG) BY MOUTH DAILY What changed:   how much to take  how to take this  when to take this  additional instructions   gabapentin 300 MG capsule Commonly known as: NEURONTIN Take 2 capsule in the morning. Take 1 capsule Mid-day and 2 capsule at bedtime. What changed:   how much to take  how to take this  when to take this  additional instructions   GLUCOSAMINE-MSM PO Take 1 tablet by mouth in the morning and at bedtime.   Magnesium 250 MG Tabs Take 250 mg by mouth daily.   methocarbamol 500 MG tablet Commonly known as: Robaxin Take 1 tablet (500 mg total) by mouth every 8 (eight) hours as needed for muscle spasms.   metoprolol succinate 25 MG 24 hr tablet Commonly known as: TOPROL-XL TAKE 1 TABLET(25 MG) BY MOUTH DAILY What changed: See the new instructions.   montelukast 10 MG tablet Commonly known as: SINGULAIR Take 1 tablet (  10 mg total) by mouth daily.   multivitamin tablet Take 1 tablet by mouth 2 (two) times daily.   nitroGLYCERIN 0.4 MG SL tablet Commonly known as: NITROSTAT Place 0.4 mg under the tongue every 5 (five) minutes as needed for chest pain.   rivaroxaban 10 MG Tabs tablet Commonly known as: XARELTO Take 1 tablet (10 mg total) by mouth daily. Start taking on: Mar 13, 2021   traMADol 50 MG tablet Commonly known as: Ultram Take 2 tablets (100 mg total) by mouth every 6 (six) hours as needed for severe pain.   Trelegy Ellipta 100-62.5-25 MCG/INH Aepb Generic drug: Fluticasone-Umeclidin-Vilant INHALE 1 PUFF INTO THE LUNGS EVERY DAY What changed:   how much to take  how to take this  when to take this  additional instructions   Vitamin D3 50  MCG (2000 UT) capsule Take 2,000 Units by mouth in the morning and at bedtime.   vitamin E 180 MG (400 UNITS) capsule Take 400 Units by mouth in the morning and at bedtime.            Discharge Care Instructions  (From admission, onward)         Start     Ordered   03/12/21 0000  Discharge wound care:       Comments: Follow up [facility] as scheduled.   03/12/21 1004          Contact information for follow-up providers    [name], MD Follow up in 1 week(s).   Specialty: Family Medicine Contact information: [place] [address] [phone]        [name], MD .   Specialty: Cardiology Contact information: [address] [place] [address] [phone]        Renette Butters, MD Follow up in 1 week(s).   Specialty: Orthopedic Surgery Contact information: [address] [place] [address] [phone]            Contact information for after-discharge care    Destination    [facility] .   Service: Skilled Nursing Contact information: [address] [place] [address] [phone]                 Allergies  Allergen Reactions  . Bee Venom Swelling  . Linzess [Linaclotide] Other (See Comments)    Abdominal pain  . Molds & Smuts Other (See Comments)    Unknown  . Seasonal Ic [Cholestatin] Other (See Comments)    Environmental Allergies    Consultations:  [facility]   Procedures/Studies: DG Chest Port 1 View  Result Date: 03/08/2021 CLINICAL DATA:  Fever.  Recent knee surgery EXAM: PORTABLE CHEST 1 VIEW COMPARISON:  Chest x-ray 03/03/2018, CT heart 07/15/2019 FINDINGS: Cardiomegaly. The heart size and mediastinal contours are unchanged. Aortic arch calcifications. Interval development of nNodular-like densities overlying the left lung. Linear atelectasis of the right lower lung zone. No pulmonary  edema. No pleural effusion. No pneumothorax. No acute osseous abnormality. IMPRESSION: Nodular-like densities overlying the left lung of unclear etiology. Recommend CT chest for further evaluation. Electronically Signed   By: Iven Finn M.D.   On: 03/08/2021 02:34   DG Knee Right Port  Result Date: 03/08/2021 CLINICAL DATA:  Fever.  Recent knee surgery EXAM: PORTABLE RIGHT KNEE - 1-2 VIEW COMPARISON:  MR knee 12/21/2020 FINDINGS: Subcutaneus soft tissue edema and emphysema overlying the anterior distal femur. No cortical erosion or destruction.  Surrounding subcutaneus soft tissue edema. No acute displaced fracture or dislocation. No definite joint effusion. IMPRESSION: 1. Subcutaneus soft tissue edema and emphysema overlying the anterior distal femur concerning for infection/inflammation. Underlying organized fluid collection not excluded. 2.  No acute displaced fracture or dislocation. 3. No definite joint effusion. Electronically Signed   By: Iven Finn M.D.   On: 03/08/2021 02:42      Subjective: Patient was seen and examined at bedside.  Overnight events noted.  Patient reports feeling much improved.  Patient stated pain is better controlled.  Patient is advised nonweightbearing on right leg.  Patient is weaned down on room air,  saturating 97% at rest.  Patient is going to be discharged to nursing home today.  Discharge Exam: Vitals:   03/11/21 1331 03/12/21 0749  BP: 121/69   Pulse: 65   Resp: 16   Temp: 98.4 F (36.9 C)   SpO2: 94% (!) 87%   Vitals:   03/11/21 0941 03/11/21 0942 03/11/21 1331 03/12/21 0749  BP:   121/69   Pulse:   65   Resp:   16   Temp:   98.4 F (36.9 C)   TempSrc:   Oral   SpO2: (!) 87% 93% 94% (!) 87%    General: Pt is alert, awake, not in acute distress Cardiovascular: RRR, S1/S2 +, no rubs, no gallops Respiratory: CTA bilaterally, no wheezing, no rhonchi Abdominal: Soft, NT, ND, bowel sounds + Extremities: Right leg in cast , no edema, no  cyanosis    The results of significant diagnostics from this hospitalization (including imaging, microbiology, ancillary and laboratory) are listed below for reference.     Microbiology: Recent Results (from the past 240 hour(s))  Resp Panel by RT-PCR (Flu A&B, Covid) Nasopharyngeal Swab     Status: None   Collection Time: 03/08/21  3:49 AM   Specimen: Nasopharyngeal Swab; Nasopharyngeal(NP) swabs in vial transport medium  Result Value Ref Range Status   SARS Coronavirus 2 by RT PCR NEGATIVE NEGATIVE Final    Comment: (NOTE) SARS-CoV-2 target nucleic acids are NOT DETECTED.  The SARS-CoV-2 RNA is generally detectable in upper respiratory specimens during the acute phase of infection. The lowest concentration of SARS-CoV-2 viral copies this assay can detect is 138 copies/mL. A negative result does not preclude SARS-Cov-2 infection and should not be used as the sole basis for treatment or other patient management decisions. A negative result may occur with  improper specimen collection/handling, submission of specimen other than nasopharyngeal swab, presence of viral mutation(s) within the areas targeted by this assay, and inadequate number of viral copies(<138 copies/mL). A negative result must be combined with clinical observations, patient history, and epidemiological information. The expected result is Negative.  Fact Sheet for Patients:  EntrepreneurPulse.com.au  Fact Sheet for Healthcare Providers:  IncredibleEmployment.be  This test is no t yet approved or cleared by the [place] FDA and  has been authorized for detection and/or diagnosis of SARS-CoV-2 by FDA under an Emergency Use Authorization (EUA). This EUA will remain  in effect (meaning this test can be used) for the duration of the COVID-19 declaration under Section 564(b)(1) of the Act, 21 U.S.C.section 360bbb-3(b)(1), unless the authorization is terminated  or revoked  sooner.       Influenza A by PCR NEGATIVE NEGATIVE Final   Influenza B by PCR NEGATIVE NEGATIVE Final    Comment: (NOTE) The Xpert Xpress SARS-CoV-2/FLU/RSV plus assay is intended as an aid in the diagnosis of influenza from Nasopharyngeal swab  specimens and should not be used as a sole basis for treatment. Nasal washings and aspirates are unacceptable for Xpert Xpress SARS-CoV-2/FLU/RSV testing.  Fact Sheet for Patients: EntrepreneurPulse.com.au  Fact Sheet for Healthcare Providers: IncredibleEmployment.be  This test is not yet approved or cleared by the [place] FDA and has been authorized for detection and/or diagnosis of SARS-CoV-2 by FDA under an Emergency Use Authorization (EUA). This EUA will remain in effect (meaning this test can be used) for the duration of the COVID-19 declaration under Section 564(b)(1) of the Act, 21 U.S.C. section 360bbb-3(b)(1), unless the authorization is terminated or revoked.  Performed at [facility], [place] [address], [place], [place] [address]   Resp Panel by RT-PCR (Flu A&B, Covid) Nasopharyngeal Swab     Status: None   Collection Time: 03/10/21  1:48 PM   Specimen: Nasopharyngeal Swab; Nasopharyngeal(NP) swabs in vial transport medium  Result Value Ref Range Status   SARS Coronavirus 2 by RT PCR NEGATIVE NEGATIVE Final    Comment: (NOTE) SARS-CoV-2 target nucleic acids are NOT DETECTED.  The SARS-CoV-2 RNA is generally detectable in upper respiratory specimens during the acute phase of infection. The lowest concentration of SARS-CoV-2 viral copies this assay can detect is 138 copies/mL. A negative result does not preclude SARS-Cov-2 infection and should not be used as the sole basis for treatment or other patient management decisions. A negative result may occur with  improper specimen collection/handling, submission of specimen other than nasopharyngeal swab, presence of  viral mutation(s) within the areas targeted by this assay, and inadequate number of viral copies(<138 copies/mL). A negative result must be combined with clinical observations, patient history, and epidemiological information. The expected result is Negative.  Fact Sheet for Patients:  EntrepreneurPulse.com.au  Fact Sheet for Healthcare Providers:  IncredibleEmployment.be  This test is no t yet approved or cleared by the [place] FDA and  has been authorized for detection and/or diagnosis of SARS-CoV-2 by FDA under an Emergency Use Authorization (EUA). This EUA will remain  in effect (meaning this test can be used) for the duration of the COVID-19 declaration under Section 564(b)(1) of the Act, 21 U.S.C.section 360bbb-3(b)(1), unless the authorization is terminated  or revoked sooner.       Influenza A by PCR NEGATIVE NEGATIVE Final   Influenza B by PCR NEGATIVE NEGATIVE Final    Comment: (NOTE) The Xpert Xpress SARS-CoV-2/FLU/RSV plus assay is intended as an aid in the diagnosis of influenza from Nasopharyngeal swab specimens and should not be used as a sole basis for treatment. Nasal washings and aspirates are unacceptable for Xpert Xpress SARS-CoV-2/FLU/RSV testing.  Fact Sheet for Patients: EntrepreneurPulse.com.au  Fact Sheet for Healthcare Providers: IncredibleEmployment.be  This test is not yet approved or cleared by the [place] FDA and has been authorized for detection and/or diagnosis of SARS-CoV-2 by FDA under an Emergency Use Authorization (EUA). This EUA will remain in effect (meaning this test can be used) for the duration of the COVID-19 declaration under Section 564(b)(1) of the Act, 21 U.S.C. section 360bbb-3(b)(1), unless the authorization is terminated or revoked.  Performed at [facility], [place] [address] [place], [place] [address]      Labs: BNP  (last 3 results) No results for input(s): BNP in the last 8760 hours. Basic Metabolic Panel: Recent Labs  Lab 03/08/21 0216 03/09/21 0318 03/10/21 0420  NA 136 138 138  K 4.2 4.0 4.0  CL 105 105 108  CO2 25 25 24   GLUCOSE  114* 96 101*  BUN 19 22 24*  CREATININE 0.83 0.90 0.72  CALCIUM 9.2 8.9 8.7*   Liver Function Tests: Recent Labs  Lab 03/09/21 0318  AST 22  ALT 24  ALKPHOS 62  BILITOT 1.1  PROT 6.7  ALBUMIN 3.3*   No results for input(s): LIPASE, AMYLASE in the last 168 hours. No results for input(s): AMMONIA in the last 168 hours. CBC: Recent Labs  Lab 03/08/21 0216 03/09/21 0318 03/10/21 0420 03/11/21 0320  WBC 8.8 8.3 8.2  --   NEUTROABS 7.0  --   --   --   HGB 12.7* 11.5* 10.8* 10.6*  HCT 36.9* 35.7* 33.0* 32.2*  MCV 93.2 98.1 96.5  --   PLT 237 228 238  --    Cardiac Enzymes: No results for input(s): CKTOTAL, CKMB, CKMBINDEX, TROPONINI in the last 168 hours. BNP: Invalid input(s): POCBNP CBG: No results for input(s): GLUCAP in the last 168 hours. D-Dimer No results for input(s): DDIMER in the last 72 hours. Hgb A1c No results for input(s): HGBA1C in the last 72 hours. Lipid Profile No results for input(s): CHOL, HDL, LDLCALC, TRIG, CHOLHDL, LDLDIRECT in the last 72 hours. Thyroid function studies No results for input(s): TSH, T4TOTAL, T3FREE, THYROIDAB in the last 72 hours.  Invalid input(s): FREET3 Anemia work up No results for input(s): VITAMINB12, FOLATE, FERRITIN, TIBC, IRON, RETICCTPCT in the last 72 hours. Urinalysis    Component Value Date/Time   COLORURINE YELLOW 03/08/2021 0349   APPEARANCEUR CLOUDY (A) 03/08/2021 0349   LABSPEC 1.019 03/08/2021 0349   LABSPEC 1.020 11/15/2019 1449   PHURINE 7.0 03/08/2021 0349   GLUCOSEU NEGATIVE 03/08/2021 0349   HGBUR NEGATIVE 03/08/2021 0349   BILIRUBINUR NEGATIVE 03/08/2021 0349   BILIRUBINUR negative 11/15/2019 1449   KETONESUR NEGATIVE 03/08/2021 0349   PROTEINUR NEGATIVE 03/08/2021  0349   UROBILINOGEN 0.2 12/13/2015 1902   NITRITE NEGATIVE 03/08/2021 0349   LEUKOCYTESUR NEGATIVE 03/08/2021 0349   Sepsis Labs Invalid input(s): PROCALCITONIN,  WBC,  LACTICIDVEN Microbiology Recent Results (from the past 240 hour(s))  Resp Panel by RT-PCR (Flu A&B, Covid) Nasopharyngeal Swab     Status: None   Collection Time: 03/08/21  3:49 AM   Specimen: Nasopharyngeal Swab; Nasopharyngeal(NP) swabs in vial transport medium  Result Value Ref Range Status   SARS Coronavirus 2 by RT PCR NEGATIVE NEGATIVE Final    Comment: (NOTE) SARS-CoV-2 target nucleic acids are NOT DETECTED.  The SARS-CoV-2 RNA is generally detectable in upper respiratory specimens during the acute phase of infection. The lowest concentration of SARS-CoV-2 viral copies this assay can detect is 138 copies/mL. A negative result does not preclude SARS-Cov-2 infection and should not be used as the sole basis for treatment or other patient management decisions. A negative result may occur with  improper specimen collection/handling, submission of specimen other than nasopharyngeal swab, presence of viral mutation(s) within the areas targeted by this assay, and inadequate number of viral copies(<138 copies/mL). A negative result must be combined with clinical observations, patient history, and epidemiological information. The expected result is Negative.  Fact Sheet for Patients:  EntrepreneurPulse.com.au  Fact Sheet for Healthcare Providers:  IncredibleEmployment.be  This test is no t yet approved or cleared by the [place] FDA and  has been authorized for detection and/or diagnosis of SARS-CoV-2 by FDA under an Emergency Use Authorization (EUA). This EUA will remain  in effect (meaning this test can be used) for the duration of the COVID-19 declaration under Section 564(b)(1) of the  Act, 21 U.S.C.section 360bbb-3(b)(1), unless the authorization is terminated  or  revoked sooner.       Influenza A by PCR NEGATIVE NEGATIVE Final   Influenza B by PCR NEGATIVE NEGATIVE Final    Comment: (NOTE) The Xpert Xpress SARS-CoV-2/FLU/RSV plus assay is intended as an aid in the diagnosis of influenza from Nasopharyngeal swab specimens and should not be used as a sole basis for treatment. Nasal washings and aspirates are unacceptable for Xpert Xpress SARS-CoV-2/FLU/RSV testing.  Fact Sheet for Patients: EntrepreneurPulse.com.au  Fact Sheet for Healthcare Providers: IncredibleEmployment.be  This test is not yet approved or cleared by the [place] FDA and has been authorized for detection and/or diagnosis of SARS-CoV-2 by FDA under an Emergency Use Authorization (EUA). This EUA will remain in effect (meaning this test can be used) for the duration of the COVID-19 declaration under Section 564(b)(1) of the Act, 21 U.S.C. section 360bbb-3(b)(1), unless the authorization is terminated or revoked.  Performed at [facility], [place] [address], [place], [place] [address]   Resp Panel by RT-PCR (Flu A&B, Covid) Nasopharyngeal Swab     Status: None   Collection Time: 03/10/21  1:48 PM   Specimen: Nasopharyngeal Swab; Nasopharyngeal(NP) swabs in vial transport medium  Result Value Ref Range Status   SARS Coronavirus 2 by RT PCR NEGATIVE NEGATIVE Final    Comment: (NOTE) SARS-CoV-2 target nucleic acids are NOT DETECTED.  The SARS-CoV-2 RNA is generally detectable in upper respiratory specimens during the acute phase of infection. The lowest concentration of SARS-CoV-2 viral copies this assay can detect is 138 copies/mL. A negative result does not preclude SARS-Cov-2 infection and should not be used as the sole basis for treatment or other patient management decisions. A negative result may occur with  improper specimen collection/handling, submission of specimen other than nasopharyngeal swab,  presence of viral mutation(s) within the areas targeted by this assay, and inadequate number of viral copies(<138 copies/mL). A negative result must be combined with clinical observations, patient history, and epidemiological information. The expected result is Negative.  Fact Sheet for Patients:  EntrepreneurPulse.com.au  Fact Sheet for Healthcare Providers:  IncredibleEmployment.be  This test is no t yet approved or cleared by the [place] FDA and  has been authorized for detection and/or diagnosis of SARS-CoV-2 by FDA under an Emergency Use Authorization (EUA). This EUA will remain  in effect (meaning this test can be used) for the duration of the COVID-19 declaration under Section 564(b)(1) of the Act, 21 U.S.C.section 360bbb-3(b)(1), unless the authorization is terminated  or revoked sooner.       Influenza A by PCR NEGATIVE NEGATIVE Final   Influenza B by PCR NEGATIVE NEGATIVE Final    Comment: (NOTE) The Xpert Xpress SARS-CoV-2/FLU/RSV plus assay is intended as an aid in the diagnosis of influenza from Nasopharyngeal swab specimens and should not be used as a sole basis for treatment. Nasal washings and aspirates are unacceptable for Xpert Xpress SARS-CoV-2/FLU/RSV testing.  Fact Sheet for Patients: EntrepreneurPulse.com.au  Fact Sheet for Healthcare Providers: IncredibleEmployment.be  This test is not yet approved or cleared by the [place] FDA and has been authorized for detection and/or diagnosis of SARS-CoV-2 by FDA under an Emergency Use Authorization (EUA). This EUA will remain in effect (meaning this test can be used) for the duration of the COVID-19 declaration under Section 564(b)(1) of the Act, 21 U.S.C. section 360bbb-3(b)(1), unless the authorization is terminated or revoked.  Performed at [facility] [address]  [address], [place] [address]       Time coordinating discharge: Over 30 minutes  SIGNED:   Shawna Clamp, MD  [facility] 03/12/2021, 12:20 PM Pager   If 7PM-7AM, please contact night-coverage [email or web address]

## 2021-03-12 NOTE — TOC Transition Note (Signed)
Transition of Care [facility]) - CM/SW Discharge Note  Patient Details  Name: Kristopher Gay MRN: [id number] Date of Birth: 1951/03/23  Transition of Care [facility] CM/SW Contact:  Sherie Don, LCSW Phone Number: 03/12/2021, 12:31 PM  Clinical Narrative: Patient to discharge back to [facility] to continue rehab. Patient will go to room 7011 and the number for report is [phone]. Discharge summary, discharge orders, PT notes, and SNF transfer report faxed to facility in hub. Medical necessity form done; PTAR scheduled. Discharge packet complete. RN updated. TOC signing off.  Final next level of care: [place] Barriers to Discharge: Barriers Resolved  Patient Goals and CMS Choice Patient states their goals for this hospitalization and ongoing recovery are:: Return to [facility] to continue rehab CMS Medicare.gov Compare Post Acute Care list provided to:: Patient Choice offered to / list presented to : Patient  Discharge Placement       Patient chooses bed at: [facility] Patient to be transferred to facility by: PTAR Patient and family notified of of transfer: 03/12/21  Discharge Plan and Services In-house Referral: Clinical Social Work Post Acute Care Choice: [facility]          DME Arranged: N/A DME Agency: NA  Readmission Risk Interventions No flowsheet data found.

## 2021-03-12 NOTE — Plan of Care (Signed)
  Problem: Education: Goal: Knowledge of General Education information will improve Description: Including pain rating scale, medication(s)/side effects and non-pharmacologic comfort measures Outcome: Adequate for Discharge   Problem: Health Behavior/Discharge Planning: Goal: Ability to manage health-related needs will improve Outcome: Adequate for Discharge   Problem: Clinical Measurements: Goal: Ability to maintain clinical measurements within normal limits will improve Outcome: Adequate for Discharge Goal: Will remain free from infection Outcome: Adequate for Discharge Goal: Diagnostic test results will improve Outcome: Adequate for Discharge Goal: Respiratory complications will improve Outcome: Adequate for Discharge Goal: Cardiovascular complication will be avoided Outcome: Adequate for Discharge   Problem: Activity: Goal: Risk for activity intolerance will decrease Outcome: Adequate for Discharge   Problem: Nutrition: Goal: Adequate nutrition will be maintained Outcome: Adequate for Discharge   Problem: Coping: Goal: Level of anxiety will decrease Outcome: Adequate for Discharge   Problem: Elimination: Goal: Will not experience complications related to bowel motility Outcome: Adequate for Discharge Goal: Will not experience complications related to urinary retention Outcome: Adequate for Discharge   Problem: Pain Managment: Goal: General experience of comfort will improve Outcome: Adequate for Discharge   Problem: Safety: Goal: Ability to remain free from injury will improve Outcome: Adequate for Discharge   Problem: Skin Integrity: Goal: Risk for impaired skin integrity will decrease Outcome: Adequate for Discharge  Report given to [facility] at [facility]

## 2021-03-12 NOTE — Discharge Instructions (Signed)
You will need to have a CAT scan of your chest to evaluate the lung nodule to make sure you will have lung cancer. Advised to follow up PCP in one week. Advised to follow up [facility] as scheduled. Advised Non weighbearing Right Leg.

## 2021-03-12 NOTE — Care Management Important Message (Signed)
Important Message  Patient Details IM Letter given to the Patient Name: Kristopher Gay MRN: [id number] Date of Birth: [birth year]   Medicare Important Message Given:  Yes     Kerin Salen 03/12/2021, 8:59 AM

## 2021-03-13 ENCOUNTER — Telehealth: Payer: Self-pay

## 2021-03-13 DIAGNOSIS — M79604 Pain in right leg: Secondary | ICD-10-CM | POA: Diagnosis not present

## 2021-03-13 DIAGNOSIS — Z7409 Other reduced mobility: Secondary | ICD-10-CM | POA: Diagnosis not present

## 2021-03-13 DIAGNOSIS — I10 Essential (primary) hypertension: Secondary | ICD-10-CM | POA: Diagnosis not present

## 2021-03-13 DIAGNOSIS — E785 Hyperlipidemia, unspecified: Secondary | ICD-10-CM | POA: Diagnosis not present

## 2021-03-13 DIAGNOSIS — J449 Chronic obstructive pulmonary disease, unspecified: Secondary | ICD-10-CM | POA: Diagnosis not present

## 2021-03-13 DIAGNOSIS — S76111D Strain of right quadriceps muscle, fascia and tendon, subsequent encounter: Secondary | ICD-10-CM | POA: Diagnosis not present

## 2021-03-13 DIAGNOSIS — Z789 Other specified health status: Secondary | ICD-10-CM | POA: Diagnosis not present

## 2021-03-13 NOTE — Telephone Encounter (Signed)
Try again.  Send a letter if you need to

## 2021-03-13 NOTE — Telephone Encounter (Signed)
I called the pt. Per my TOC report to get him scheduled for a hospital f/u and go over his medications. I was unable to LM pts. Voicemail was full.

## 2021-03-15 NOTE — Telephone Encounter (Signed)
Called pt. Again unable to LM. I will mail pt. Letter.

## 2021-03-18 DIAGNOSIS — M66251 Spontaneous rupture of extensor tendons, right thigh: Secondary | ICD-10-CM | POA: Diagnosis not present

## 2021-03-20 DIAGNOSIS — M66251 Spontaneous rupture of extensor tendons, right thigh: Secondary | ICD-10-CM | POA: Diagnosis not present

## 2021-03-30 ENCOUNTER — Other Ambulatory Visit: Payer: Self-pay | Admitting: Medical

## 2021-04-18 ENCOUNTER — Other Ambulatory Visit: Payer: Self-pay | Admitting: Family Medicine

## 2021-04-18 DIAGNOSIS — R609 Edema, unspecified: Secondary | ICD-10-CM

## 2021-04-22 ENCOUNTER — Telehealth: Payer: Self-pay | Admitting: *Deleted

## 2021-04-22 NOTE — Telephone Encounter (Signed)
Mr Poser called and the gabapentin that Zella Ball ordered is not helping his hip. It feels like there is a needle in his hip. He is asking for something else for pain.

## 2021-04-23 NOTE — Telephone Encounter (Signed)
Return Mr. Falzone call, voicemail full. Unable to leave message, will await his return call.

## 2021-05-06 DIAGNOSIS — S76111D Strain of right quadriceps muscle, fascia and tendon, subsequent encounter: Secondary | ICD-10-CM | POA: Diagnosis not present

## 2021-05-20 DIAGNOSIS — I10 Essential (primary) hypertension: Secondary | ICD-10-CM | POA: Diagnosis not present

## 2021-05-20 DIAGNOSIS — S76111D Strain of right quadriceps muscle, fascia and tendon, subsequent encounter: Secondary | ICD-10-CM | POA: Diagnosis not present

## 2021-05-20 DIAGNOSIS — J439 Emphysema, unspecified: Secondary | ICD-10-CM | POA: Diagnosis not present

## 2021-05-20 DIAGNOSIS — I4891 Unspecified atrial fibrillation: Secondary | ICD-10-CM | POA: Diagnosis not present

## 2021-05-20 DIAGNOSIS — G629 Polyneuropathy, unspecified: Secondary | ICD-10-CM | POA: Diagnosis not present

## 2021-05-20 DIAGNOSIS — F32A Depression, unspecified: Secondary | ICD-10-CM | POA: Diagnosis not present

## 2021-05-20 DIAGNOSIS — E785 Hyperlipidemia, unspecified: Secondary | ICD-10-CM | POA: Diagnosis not present

## 2021-05-20 DIAGNOSIS — I8392 Asymptomatic varicose veins of left lower extremity: Secondary | ICD-10-CM | POA: Diagnosis not present

## 2021-05-20 DIAGNOSIS — K219 Gastro-esophageal reflux disease without esophagitis: Secondary | ICD-10-CM | POA: Diagnosis not present

## 2021-05-22 DIAGNOSIS — K219 Gastro-esophageal reflux disease without esophagitis: Secondary | ICD-10-CM | POA: Diagnosis not present

## 2021-05-22 DIAGNOSIS — G629 Polyneuropathy, unspecified: Secondary | ICD-10-CM | POA: Diagnosis not present

## 2021-05-22 DIAGNOSIS — F32A Depression, unspecified: Secondary | ICD-10-CM | POA: Diagnosis not present

## 2021-05-22 DIAGNOSIS — E785 Hyperlipidemia, unspecified: Secondary | ICD-10-CM | POA: Diagnosis not present

## 2021-05-22 DIAGNOSIS — I4891 Unspecified atrial fibrillation: Secondary | ICD-10-CM | POA: Diagnosis not present

## 2021-05-22 DIAGNOSIS — J439 Emphysema, unspecified: Secondary | ICD-10-CM | POA: Diagnosis not present

## 2021-05-22 DIAGNOSIS — I10 Essential (primary) hypertension: Secondary | ICD-10-CM | POA: Diagnosis not present

## 2021-05-22 DIAGNOSIS — S76111D Strain of right quadriceps muscle, fascia and tendon, subsequent encounter: Secondary | ICD-10-CM | POA: Diagnosis not present

## 2021-05-22 DIAGNOSIS — I8392 Asymptomatic varicose veins of left lower extremity: Secondary | ICD-10-CM | POA: Diagnosis not present

## 2021-05-23 ENCOUNTER — Other Ambulatory Visit: Payer: Self-pay | Admitting: Registered Nurse

## 2021-05-23 NOTE — Telephone Encounter (Signed)
Placed a call to [facility]:  Spoke with Education administrator. Last refill of Celebrex was on 12/27/2020. Spoke with Kristopher Gay he has been taking the Celebrex twice a day as prescribe. He reports he was in a Skilled Rehab facility for a while and he needs a refill on his Celebrex. Refill sent today, he verbalizes understanding.

## 2021-05-24 DIAGNOSIS — E785 Hyperlipidemia, unspecified: Secondary | ICD-10-CM | POA: Diagnosis not present

## 2021-05-24 DIAGNOSIS — K219 Gastro-esophageal reflux disease without esophagitis: Secondary | ICD-10-CM | POA: Diagnosis not present

## 2021-05-24 DIAGNOSIS — I10 Essential (primary) hypertension: Secondary | ICD-10-CM | POA: Diagnosis not present

## 2021-05-24 DIAGNOSIS — S76111D Strain of right quadriceps muscle, fascia and tendon, subsequent encounter: Secondary | ICD-10-CM | POA: Diagnosis not present

## 2021-05-24 DIAGNOSIS — J439 Emphysema, unspecified: Secondary | ICD-10-CM | POA: Diagnosis not present

## 2021-05-24 DIAGNOSIS — I4891 Unspecified atrial fibrillation: Secondary | ICD-10-CM | POA: Diagnosis not present

## 2021-05-24 DIAGNOSIS — F32A Depression, unspecified: Secondary | ICD-10-CM | POA: Diagnosis not present

## 2021-05-24 DIAGNOSIS — I8392 Asymptomatic varicose veins of left lower extremity: Secondary | ICD-10-CM | POA: Diagnosis not present

## 2021-05-24 DIAGNOSIS — G629 Polyneuropathy, unspecified: Secondary | ICD-10-CM | POA: Diagnosis not present

## 2021-05-28 DIAGNOSIS — I4891 Unspecified atrial fibrillation: Secondary | ICD-10-CM | POA: Diagnosis not present

## 2021-05-28 DIAGNOSIS — F32A Depression, unspecified: Secondary | ICD-10-CM | POA: Diagnosis not present

## 2021-05-28 DIAGNOSIS — I10 Essential (primary) hypertension: Secondary | ICD-10-CM | POA: Diagnosis not present

## 2021-05-28 DIAGNOSIS — S76111D Strain of right quadriceps muscle, fascia and tendon, subsequent encounter: Secondary | ICD-10-CM | POA: Diagnosis not present

## 2021-05-28 DIAGNOSIS — K219 Gastro-esophageal reflux disease without esophagitis: Secondary | ICD-10-CM | POA: Diagnosis not present

## 2021-05-28 DIAGNOSIS — J439 Emphysema, unspecified: Secondary | ICD-10-CM | POA: Diagnosis not present

## 2021-05-28 DIAGNOSIS — G629 Polyneuropathy, unspecified: Secondary | ICD-10-CM | POA: Diagnosis not present

## 2021-05-28 DIAGNOSIS — I8392 Asymptomatic varicose veins of left lower extremity: Secondary | ICD-10-CM | POA: Diagnosis not present

## 2021-05-28 DIAGNOSIS — E785 Hyperlipidemia, unspecified: Secondary | ICD-10-CM | POA: Diagnosis not present

## 2021-05-30 DIAGNOSIS — I8392 Asymptomatic varicose veins of left lower extremity: Secondary | ICD-10-CM | POA: Diagnosis not present

## 2021-05-30 DIAGNOSIS — K219 Gastro-esophageal reflux disease without esophagitis: Secondary | ICD-10-CM | POA: Diagnosis not present

## 2021-05-30 DIAGNOSIS — J439 Emphysema, unspecified: Secondary | ICD-10-CM | POA: Diagnosis not present

## 2021-05-30 DIAGNOSIS — F32A Depression, unspecified: Secondary | ICD-10-CM | POA: Diagnosis not present

## 2021-05-30 DIAGNOSIS — I10 Essential (primary) hypertension: Secondary | ICD-10-CM | POA: Diagnosis not present

## 2021-05-30 DIAGNOSIS — I4891 Unspecified atrial fibrillation: Secondary | ICD-10-CM | POA: Diagnosis not present

## 2021-05-30 DIAGNOSIS — E785 Hyperlipidemia, unspecified: Secondary | ICD-10-CM | POA: Diagnosis not present

## 2021-05-30 DIAGNOSIS — G629 Polyneuropathy, unspecified: Secondary | ICD-10-CM | POA: Diagnosis not present

## 2021-05-30 DIAGNOSIS — S76111D Strain of right quadriceps muscle, fascia and tendon, subsequent encounter: Secondary | ICD-10-CM | POA: Diagnosis not present

## 2021-06-04 DIAGNOSIS — J439 Emphysema, unspecified: Secondary | ICD-10-CM | POA: Diagnosis not present

## 2021-06-04 DIAGNOSIS — I10 Essential (primary) hypertension: Secondary | ICD-10-CM | POA: Diagnosis not present

## 2021-06-04 DIAGNOSIS — F32A Depression, unspecified: Secondary | ICD-10-CM | POA: Diagnosis not present

## 2021-06-04 DIAGNOSIS — K219 Gastro-esophageal reflux disease without esophagitis: Secondary | ICD-10-CM | POA: Diagnosis not present

## 2021-06-04 DIAGNOSIS — E785 Hyperlipidemia, unspecified: Secondary | ICD-10-CM | POA: Diagnosis not present

## 2021-06-04 DIAGNOSIS — G629 Polyneuropathy, unspecified: Secondary | ICD-10-CM | POA: Diagnosis not present

## 2021-06-04 DIAGNOSIS — I8392 Asymptomatic varicose veins of left lower extremity: Secondary | ICD-10-CM | POA: Diagnosis not present

## 2021-06-04 DIAGNOSIS — S76111D Strain of right quadriceps muscle, fascia and tendon, subsequent encounter: Secondary | ICD-10-CM | POA: Diagnosis not present

## 2021-06-04 DIAGNOSIS — I4891 Unspecified atrial fibrillation: Secondary | ICD-10-CM | POA: Diagnosis not present

## 2021-06-06 DIAGNOSIS — E785 Hyperlipidemia, unspecified: Secondary | ICD-10-CM | POA: Diagnosis not present

## 2021-06-06 DIAGNOSIS — I10 Essential (primary) hypertension: Secondary | ICD-10-CM | POA: Diagnosis not present

## 2021-06-06 DIAGNOSIS — I8392 Asymptomatic varicose veins of left lower extremity: Secondary | ICD-10-CM | POA: Diagnosis not present

## 2021-06-06 DIAGNOSIS — I4891 Unspecified atrial fibrillation: Secondary | ICD-10-CM | POA: Diagnosis not present

## 2021-06-06 DIAGNOSIS — K219 Gastro-esophageal reflux disease without esophagitis: Secondary | ICD-10-CM | POA: Diagnosis not present

## 2021-06-06 DIAGNOSIS — S76111D Strain of right quadriceps muscle, fascia and tendon, subsequent encounter: Secondary | ICD-10-CM | POA: Diagnosis not present

## 2021-06-06 DIAGNOSIS — J439 Emphysema, unspecified: Secondary | ICD-10-CM | POA: Diagnosis not present

## 2021-06-06 DIAGNOSIS — F32A Depression, unspecified: Secondary | ICD-10-CM | POA: Diagnosis not present

## 2021-06-06 DIAGNOSIS — G629 Polyneuropathy, unspecified: Secondary | ICD-10-CM | POA: Diagnosis not present

## 2021-06-11 DIAGNOSIS — I4891 Unspecified atrial fibrillation: Secondary | ICD-10-CM | POA: Diagnosis not present

## 2021-06-11 DIAGNOSIS — I10 Essential (primary) hypertension: Secondary | ICD-10-CM | POA: Diagnosis not present

## 2021-06-11 DIAGNOSIS — K219 Gastro-esophageal reflux disease without esophagitis: Secondary | ICD-10-CM | POA: Diagnosis not present

## 2021-06-11 DIAGNOSIS — S76111D Strain of right quadriceps muscle, fascia and tendon, subsequent encounter: Secondary | ICD-10-CM | POA: Diagnosis not present

## 2021-06-11 DIAGNOSIS — G629 Polyneuropathy, unspecified: Secondary | ICD-10-CM | POA: Diagnosis not present

## 2021-06-11 DIAGNOSIS — E785 Hyperlipidemia, unspecified: Secondary | ICD-10-CM | POA: Diagnosis not present

## 2021-06-11 DIAGNOSIS — J439 Emphysema, unspecified: Secondary | ICD-10-CM | POA: Diagnosis not present

## 2021-06-11 DIAGNOSIS — I8392 Asymptomatic varicose veins of left lower extremity: Secondary | ICD-10-CM | POA: Diagnosis not present

## 2021-06-11 DIAGNOSIS — F32A Depression, unspecified: Secondary | ICD-10-CM | POA: Diagnosis not present

## 2021-06-13 ENCOUNTER — Telehealth: Payer: Self-pay | Admitting: Internal Medicine

## 2021-06-13 DIAGNOSIS — I8392 Asymptomatic varicose veins of left lower extremity: Secondary | ICD-10-CM | POA: Diagnosis not present

## 2021-06-13 DIAGNOSIS — S76111D Strain of right quadriceps muscle, fascia and tendon, subsequent encounter: Secondary | ICD-10-CM | POA: Diagnosis not present

## 2021-06-13 DIAGNOSIS — I4891 Unspecified atrial fibrillation: Secondary | ICD-10-CM | POA: Diagnosis not present

## 2021-06-13 DIAGNOSIS — F32A Depression, unspecified: Secondary | ICD-10-CM | POA: Diagnosis not present

## 2021-06-13 DIAGNOSIS — G629 Polyneuropathy, unspecified: Secondary | ICD-10-CM | POA: Diagnosis not present

## 2021-06-13 DIAGNOSIS — I10 Essential (primary) hypertension: Secondary | ICD-10-CM | POA: Diagnosis not present

## 2021-06-13 DIAGNOSIS — J439 Emphysema, unspecified: Secondary | ICD-10-CM | POA: Diagnosis not present

## 2021-06-13 DIAGNOSIS — K219 Gastro-esophageal reflux disease without esophagitis: Secondary | ICD-10-CM | POA: Diagnosis not present

## 2021-06-13 DIAGNOSIS — E785 Hyperlipidemia, unspecified: Secondary | ICD-10-CM | POA: Diagnosis not present

## 2021-06-13 NOTE — Telephone Encounter (Signed)
Pt c/o BP issue: STAT if pt c/o blurred vision, one-sided weakness or slurred speech  1. What are your last 5 BP readings? 182/102  2. Are you having any other symptoms (ex. Dizziness, headache, blurred vision, passed out)? fatigue  3. What is your BP issue? Mark the home health nurse is calling with concerns stating even though he is taking his medication his BP is still running high

## 2021-06-13 NOTE — Telephone Encounter (Signed)
Spoke to patient. Patient states he is homebound. Unable to come to office for visit .  Patient was recently discharge from  nursing/rehab facility on July 20,2022  he was there from May 6- July 20/  after having a second surgery on knee.- Feb 26, 2021 ( first knee surgery in sept 2022)  Patient states he is having to do everything for himself since leaving rehab facility . He has physical therapy coming 3 times a week . OT has not come on schedule timeslot.   Patient states he notice blood pressure becoming elevated since last Thursday   183/90 ,  182 /102  today ,patient states blood pressure did read earlier today with PT 143/80. Pt has been coming 3 times a week.     Patient states he is extremely fatigue.   Legs are swollen right leg more that left.  Right leg is the extremity that had knee surgery.   Patient states he using lasix 20 mg daily    And taking Metoprolol  daily.     Patient has not seen any doctor since discharge  from rehab facility. Patient states he can not  travel to any office at present time due to his immobility and living alone with no help with transport.   RN informed patient will defer to Dr Debara Pickett.- medication changes are needed or some type of visit ( possible offer a virtual visit if needed ) patient  is aware  will cal back

## 2021-06-14 NOTE — Telephone Encounter (Signed)
Spoke to patient. Patient states he is unableto come to office or go anywhere. He is home bound  He states if blood pressure is elevated should he go to ER. Patient  states he does not know what Dr Debara Pickett would do in person  , only what I say to him."  Patient decline an appointment

## 2021-06-14 NOTE — Telephone Encounter (Signed)
Too many issues to address over the phone -needs to be seen in the office or he could try to get to an urgent care.  Dr. Lemmie Evens

## 2021-06-17 DIAGNOSIS — F32A Depression, unspecified: Secondary | ICD-10-CM | POA: Diagnosis not present

## 2021-06-17 DIAGNOSIS — I10 Essential (primary) hypertension: Secondary | ICD-10-CM | POA: Diagnosis not present

## 2021-06-17 DIAGNOSIS — G629 Polyneuropathy, unspecified: Secondary | ICD-10-CM | POA: Diagnosis not present

## 2021-06-17 DIAGNOSIS — I8392 Asymptomatic varicose veins of left lower extremity: Secondary | ICD-10-CM | POA: Diagnosis not present

## 2021-06-17 DIAGNOSIS — J439 Emphysema, unspecified: Secondary | ICD-10-CM | POA: Diagnosis not present

## 2021-06-17 DIAGNOSIS — I4891 Unspecified atrial fibrillation: Secondary | ICD-10-CM | POA: Diagnosis not present

## 2021-06-17 DIAGNOSIS — S76111D Strain of right quadriceps muscle, fascia and tendon, subsequent encounter: Secondary | ICD-10-CM | POA: Diagnosis not present

## 2021-06-17 DIAGNOSIS — K219 Gastro-esophageal reflux disease without esophagitis: Secondary | ICD-10-CM | POA: Diagnosis not present

## 2021-06-17 DIAGNOSIS — E785 Hyperlipidemia, unspecified: Secondary | ICD-10-CM | POA: Diagnosis not present

## 2021-06-18 ENCOUNTER — Telehealth: Payer: Self-pay | Admitting: Internal Medicine

## 2021-06-18 DIAGNOSIS — I4891 Unspecified atrial fibrillation: Secondary | ICD-10-CM | POA: Diagnosis not present

## 2021-06-18 DIAGNOSIS — G629 Polyneuropathy, unspecified: Secondary | ICD-10-CM | POA: Diagnosis not present

## 2021-06-18 DIAGNOSIS — S76111D Strain of right quadriceps muscle, fascia and tendon, subsequent encounter: Secondary | ICD-10-CM | POA: Diagnosis not present

## 2021-06-18 DIAGNOSIS — F32A Depression, unspecified: Secondary | ICD-10-CM | POA: Diagnosis not present

## 2021-06-18 DIAGNOSIS — E785 Hyperlipidemia, unspecified: Secondary | ICD-10-CM | POA: Diagnosis not present

## 2021-06-18 DIAGNOSIS — K219 Gastro-esophageal reflux disease without esophagitis: Secondary | ICD-10-CM | POA: Diagnosis not present

## 2021-06-18 DIAGNOSIS — I10 Essential (primary) hypertension: Secondary | ICD-10-CM | POA: Diagnosis not present

## 2021-06-18 DIAGNOSIS — I8392 Asymptomatic varicose veins of left lower extremity: Secondary | ICD-10-CM | POA: Diagnosis not present

## 2021-06-18 DIAGNOSIS — J439 Emphysema, unspecified: Secondary | ICD-10-CM | POA: Diagnosis not present

## 2021-06-18 NOTE — Telephone Encounter (Signed)
Pt c/o BP issue: STAT if pt c/o blurred vision, one-sided weakness or slurred speech  1. What are your last 5 BP readings?  06/04/21 170's/100 06/06/21 170's/100 06/11/21 160/100 06/13/21 143/87  2. Are you having any other symptoms (ex. Dizziness, headache, blurred vision, passed out)? No   3. What is your BP issue? Hypertension. Discharging pt from physical therapy today.

## 2021-06-18 NOTE — Telephone Encounter (Signed)
Left a message for the patient to call back.  

## 2021-06-19 NOTE — Telephone Encounter (Signed)
Left message to call back  

## 2021-06-19 NOTE — Telephone Encounter (Signed)
Follow Up:     Patient is returning Kristopher Gay's call from today.

## 2021-06-19 NOTE — Telephone Encounter (Signed)
Spoke with the patient who states that he was dismissed from PT due to being non-compliant so PT was calling us to cover themselves. Patient states he has been compliant but they do not have the staff to cover his needs.   Patient states BP has been elevated for a couple of weeks now. He reports speaking with a nurse last week who offered him an appointment however he is unable to leave his home. I offered the patient assistance with transportation and he declined. I reviewed Dr. Lysbeth Penner recommendations once again to go to an urgent care for evaluation.  Patient also complaining of fatigue, headache, and swelling.  Patient is aware to go to ER if problems persist or worsen.

## 2021-06-19 NOTE — Telephone Encounter (Signed)
Pt is returning call.  

## 2021-07-03 ENCOUNTER — Other Ambulatory Visit: Payer: Self-pay | Admitting: Registered Nurse

## 2021-07-05 ENCOUNTER — Telehealth: Payer: Self-pay | Admitting: Family Medicine

## 2021-07-05 ENCOUNTER — Telehealth: Payer: Self-pay | Admitting: Registered Nurse

## 2021-07-05 ENCOUNTER — Telehealth: Payer: Self-pay | Admitting: *Deleted

## 2021-07-05 DIAGNOSIS — Z79891 Long term (current) use of opiate analgesic: Secondary | ICD-10-CM | POA: Diagnosis not present

## 2021-07-05 DIAGNOSIS — I4891 Unspecified atrial fibrillation: Secondary | ICD-10-CM | POA: Diagnosis not present

## 2021-07-05 DIAGNOSIS — I10 Essential (primary) hypertension: Secondary | ICD-10-CM | POA: Diagnosis not present

## 2021-07-05 DIAGNOSIS — Z791 Long term (current) use of non-steroidal anti-inflammatories (NSAID): Secondary | ICD-10-CM | POA: Diagnosis not present

## 2021-07-05 DIAGNOSIS — Z9181 History of falling: Secondary | ICD-10-CM | POA: Diagnosis not present

## 2021-07-05 DIAGNOSIS — J449 Chronic obstructive pulmonary disease, unspecified: Secondary | ICD-10-CM | POA: Diagnosis not present

## 2021-07-05 DIAGNOSIS — M170 Bilateral primary osteoarthritis of knee: Secondary | ICD-10-CM | POA: Diagnosis not present

## 2021-07-05 DIAGNOSIS — M479 Spondylosis, unspecified: Secondary | ICD-10-CM | POA: Diagnosis not present

## 2021-07-05 DIAGNOSIS — S76191D Other specified injury of right quadriceps muscle, fascia and tendon, subsequent encounter: Secondary | ICD-10-CM | POA: Diagnosis not present

## 2021-07-05 MED ORDER — TRAMADOL HCL 50 MG PO TABS: ORAL_TABLET | ORAL | 1 refills | Status: DC

## 2021-07-05 NOTE — Telephone Encounter (Signed)
[name] requesting a refill on Tramadol. He reports he has pain in his lowe back and right knee. He was discharged on 03/08/2021, he was prescribed Tramadol 50 mg two tablets every 6 hours.  [name] reports he wa at [place], facility was called they use [facility], they don't report to the PMP. He was taking Tramadol 50 mg two tablets every 6 hours, Tramadol e-scribed today.  Placed a call to [name] regarding the above, he verbalizes understanding.  MME= 40.00

## 2021-07-05 NOTE — Telephone Encounter (Signed)
PMP was Reviewed.  Tramadol e-scribed today.  Placed a call to Mr. Binge regarding the above, no answer. Left message to return the call.

## 2021-07-05 NOTE — Telephone Encounter (Signed)
[name] PT [name] called to get update on meds for Mr Cansler from our office. Gabapentin, Celebrex, and Tramadol.

## 2021-07-05 NOTE — Telephone Encounter (Signed)
[name] with [name]  [phone]  He saw patient today based on referral  form ortho. for home health  His Bp 160/100 No symptoms No info on initial referral on his current medications He  needs updated list of medications faxed over to him  Fax  [phone]  On discharge orders from skilled nursing facility from end of July , the meds listed below were listed on d/c summary but patient does not have and pt feels he does not need to be taking  Zarelto Nitroglycerin Epi pen Ducofate sodium (pt currently has no          constipation issues) Robaxin   He recommends that patient be seen for follow up visit. He has not seen anyone since he left SNF

## 2021-07-08 DIAGNOSIS — J449 Chronic obstructive pulmonary disease, unspecified: Secondary | ICD-10-CM | POA: Diagnosis not present

## 2021-07-08 DIAGNOSIS — Z79891 Long term (current) use of opiate analgesic: Secondary | ICD-10-CM | POA: Diagnosis not present

## 2021-07-08 DIAGNOSIS — S76191D Other specified injury of right quadriceps muscle, fascia and tendon, subsequent encounter: Secondary | ICD-10-CM | POA: Diagnosis not present

## 2021-07-08 DIAGNOSIS — M170 Bilateral primary osteoarthritis of knee: Secondary | ICD-10-CM | POA: Diagnosis not present

## 2021-07-08 DIAGNOSIS — Z791 Long term (current) use of non-steroidal anti-inflammatories (NSAID): Secondary | ICD-10-CM | POA: Diagnosis not present

## 2021-07-08 DIAGNOSIS — I10 Essential (primary) hypertension: Secondary | ICD-10-CM | POA: Diagnosis not present

## 2021-07-08 DIAGNOSIS — I4891 Unspecified atrial fibrillation: Secondary | ICD-10-CM | POA: Diagnosis not present

## 2021-07-08 DIAGNOSIS — Z9181 History of falling: Secondary | ICD-10-CM | POA: Diagnosis not present

## 2021-07-08 DIAGNOSIS — M479 Spondylosis, unspecified: Secondary | ICD-10-CM | POA: Diagnosis not present

## 2021-07-09 ENCOUNTER — Telehealth: Payer: Self-pay | Admitting: Internal Medicine

## 2021-07-09 ENCOUNTER — Telehealth: Payer: Self-pay | Admitting: Family Medicine

## 2021-07-09 DIAGNOSIS — Z791 Long term (current) use of non-steroidal anti-inflammatories (NSAID): Secondary | ICD-10-CM | POA: Diagnosis not present

## 2021-07-09 DIAGNOSIS — Z9181 History of falling: Secondary | ICD-10-CM | POA: Diagnosis not present

## 2021-07-09 DIAGNOSIS — I4891 Unspecified atrial fibrillation: Secondary | ICD-10-CM | POA: Diagnosis not present

## 2021-07-09 DIAGNOSIS — Z79891 Long term (current) use of opiate analgesic: Secondary | ICD-10-CM | POA: Diagnosis not present

## 2021-07-09 DIAGNOSIS — J449 Chronic obstructive pulmonary disease, unspecified: Secondary | ICD-10-CM | POA: Diagnosis not present

## 2021-07-09 DIAGNOSIS — S76191D Other specified injury of right quadriceps muscle, fascia and tendon, subsequent encounter: Secondary | ICD-10-CM | POA: Diagnosis not present

## 2021-07-09 DIAGNOSIS — I10 Essential (primary) hypertension: Secondary | ICD-10-CM | POA: Diagnosis not present

## 2021-07-09 DIAGNOSIS — M170 Bilateral primary osteoarthritis of knee: Secondary | ICD-10-CM | POA: Diagnosis not present

## 2021-07-09 DIAGNOSIS — M479 Spondylosis, unspecified: Secondary | ICD-10-CM | POA: Diagnosis not present

## 2021-07-09 NOTE — Telephone Encounter (Signed)
Returned call to [name] with [facility] left message to call back.

## 2021-07-09 NOTE — Telephone Encounter (Signed)
Kristopher Gay from [facility] in and stated:  Pt has been in and out of the hosp and skilled nursing the past few months  [facility] went out on Friday,  BP 160/100 asymptomatic  They need to know if pt is still suppose to be on  rivaroxaban (XARELTO) 10 MG TABS tablet [id number]- Kristopher cell number

## 2021-07-09 NOTE — Telephone Encounter (Signed)
[name] with [name] is returning call.

## 2021-07-09 NOTE — Telephone Encounter (Signed)
[name] with [name]  [phone] He called re message he left Friday, he has not heard back from our office.  Did advise that we left message for pt to call our office. Mr. Heber King William confirmed phone # for pt is valid.  Mr. Heber Wardell states he has been in touch with Dr. Debara Pickett, cardiologist to get him in due to elevated BP reaqdings

## 2021-07-09 NOTE — Telephone Encounter (Signed)
Left message for pt to call and schedule appt per Dr. Redmond School

## 2021-07-09 NOTE — Telephone Encounter (Signed)
Returned call to [employer] left message to call back.

## 2021-07-09 NOTE — Telephone Encounter (Signed)
[name] physical therapist with [name] [phone] called She was at patient's home for PT and pt has  a "bug bite" of some type on his left shoulder. She states that it is red, Swollen and warm to touch from shoulder to elbow. Patient states he has been using ice pack on it. She also mentioned that Clair Gulling from [facility] told her that we had tried to reach patient but patient states he has not received a call.  I informed Claiborne Billings that you may want to see the patient concerning the bite on his arm. She put call on speaker phone and advised patient that we need to see him, he states he does not have time to come in that he has all these other appointments that he has to go to.  I advised Claiborne Billings that I would send her message back

## 2021-07-09 NOTE — Telephone Encounter (Signed)
Received a call back from [name] with [name] calling to report patient was discharged from skilled nursing 2 months ago.Stated when he saw patient this past Friday 9/9 B/P elevated 160/100.Stated he was told he does not have afib and he wanted to know if he needs to continue Xarelto.Advised he will need to continue.Appointment scheduled with Almyra Deforest PA 9/19 at 2:15 pm. Spoke to patient advised of appointment.He stated he will be transported to office via stretcher by [facility] transportation.Stated he will need to leave on time.Advised I will make [place] aware.

## 2021-07-10 DIAGNOSIS — M66251 Spontaneous rupture of extensor tendons, right thigh: Secondary | ICD-10-CM | POA: Diagnosis not present

## 2021-07-11 DIAGNOSIS — I10 Essential (primary) hypertension: Secondary | ICD-10-CM | POA: Diagnosis not present

## 2021-07-11 DIAGNOSIS — Z791 Long term (current) use of non-steroidal anti-inflammatories (NSAID): Secondary | ICD-10-CM | POA: Diagnosis not present

## 2021-07-11 DIAGNOSIS — J449 Chronic obstructive pulmonary disease, unspecified: Secondary | ICD-10-CM | POA: Diagnosis not present

## 2021-07-11 DIAGNOSIS — M170 Bilateral primary osteoarthritis of knee: Secondary | ICD-10-CM | POA: Diagnosis not present

## 2021-07-11 DIAGNOSIS — Z79891 Long term (current) use of opiate analgesic: Secondary | ICD-10-CM | POA: Diagnosis not present

## 2021-07-11 DIAGNOSIS — I4891 Unspecified atrial fibrillation: Secondary | ICD-10-CM | POA: Diagnosis not present

## 2021-07-11 DIAGNOSIS — S76191D Other specified injury of right quadriceps muscle, fascia and tendon, subsequent encounter: Secondary | ICD-10-CM | POA: Diagnosis not present

## 2021-07-11 DIAGNOSIS — Z9181 History of falling: Secondary | ICD-10-CM | POA: Diagnosis not present

## 2021-07-11 DIAGNOSIS — M479 Spondylosis, unspecified: Secondary | ICD-10-CM | POA: Diagnosis not present

## 2021-07-12 DIAGNOSIS — Z791 Long term (current) use of non-steroidal anti-inflammatories (NSAID): Secondary | ICD-10-CM | POA: Diagnosis not present

## 2021-07-12 DIAGNOSIS — M170 Bilateral primary osteoarthritis of knee: Secondary | ICD-10-CM | POA: Diagnosis not present

## 2021-07-12 DIAGNOSIS — Z79891 Long term (current) use of opiate analgesic: Secondary | ICD-10-CM | POA: Diagnosis not present

## 2021-07-12 DIAGNOSIS — J449 Chronic obstructive pulmonary disease, unspecified: Secondary | ICD-10-CM | POA: Diagnosis not present

## 2021-07-12 DIAGNOSIS — Z9181 History of falling: Secondary | ICD-10-CM | POA: Diagnosis not present

## 2021-07-12 DIAGNOSIS — S76191D Other specified injury of right quadriceps muscle, fascia and tendon, subsequent encounter: Secondary | ICD-10-CM | POA: Diagnosis not present

## 2021-07-12 DIAGNOSIS — M479 Spondylosis, unspecified: Secondary | ICD-10-CM | POA: Diagnosis not present

## 2021-07-12 DIAGNOSIS — I4891 Unspecified atrial fibrillation: Secondary | ICD-10-CM | POA: Diagnosis not present

## 2021-07-12 DIAGNOSIS — I10 Essential (primary) hypertension: Secondary | ICD-10-CM | POA: Diagnosis not present

## 2021-07-15 ENCOUNTER — Other Ambulatory Visit: Payer: Self-pay

## 2021-07-15 ENCOUNTER — Encounter: Payer: Self-pay | Admitting: Physician Assistant

## 2021-07-15 ENCOUNTER — Ambulatory Visit (INDEPENDENT_AMBULATORY_CARE_PROVIDER_SITE_OTHER): Payer: Medicare PPO | Admitting: Physician Assistant

## 2021-07-15 VITALS — BP 124/76 | HR 60 | Ht 74.0 in | Wt 266.0 lb

## 2021-07-15 DIAGNOSIS — E785 Hyperlipidemia, unspecified: Secondary | ICD-10-CM | POA: Diagnosis not present

## 2021-07-15 DIAGNOSIS — Z9181 History of falling: Secondary | ICD-10-CM | POA: Diagnosis not present

## 2021-07-15 DIAGNOSIS — Z79891 Long term (current) use of opiate analgesic: Secondary | ICD-10-CM | POA: Diagnosis not present

## 2021-07-15 DIAGNOSIS — I4891 Unspecified atrial fibrillation: Secondary | ICD-10-CM | POA: Diagnosis not present

## 2021-07-15 DIAGNOSIS — I10 Essential (primary) hypertension: Secondary | ICD-10-CM

## 2021-07-15 DIAGNOSIS — R2689 Other abnormalities of gait and mobility: Secondary | ICD-10-CM

## 2021-07-15 DIAGNOSIS — Z791 Long term (current) use of non-steroidal anti-inflammatories (NSAID): Secondary | ICD-10-CM | POA: Diagnosis not present

## 2021-07-15 DIAGNOSIS — M479 Spondylosis, unspecified: Secondary | ICD-10-CM | POA: Diagnosis not present

## 2021-07-15 DIAGNOSIS — J449 Chronic obstructive pulmonary disease, unspecified: Secondary | ICD-10-CM | POA: Diagnosis not present

## 2021-07-15 DIAGNOSIS — S76191D Other specified injury of right quadriceps muscle, fascia and tendon, subsequent encounter: Secondary | ICD-10-CM | POA: Diagnosis not present

## 2021-07-15 DIAGNOSIS — I480 Paroxysmal atrial fibrillation: Secondary | ICD-10-CM | POA: Diagnosis not present

## 2021-07-15 DIAGNOSIS — M170 Bilateral primary osteoarthritis of knee: Secondary | ICD-10-CM | POA: Diagnosis not present

## 2021-07-15 NOTE — Patient Instructions (Addendum)
Medication Instructions:  Your physician recommends that you continue on your current medications as directed. Please refer to the Current Medication list given to you today.  *If you need a refill on your cardiac medications before your next appointment, please call your pharmacy*  Lab Work: NONE ordered at this time of appointment   If you have labs (blood work) drawn today and your tests are completely normal, you will receive your results only by: [place] (if you have MyChart) OR A paper copy in the mail If you have any lab test that is abnormal or we need to change your treatment, we will call you to review the results.  Testing/Procedures: NONE ordered at this time of appointment   Follow-Up: At [facility], you and your health needs are our priority.  As part of our continuing mission to provide you with exceptional heart care, we have created designated Provider Care Teams.  These Care Teams include your primary Cardiologist (physician) and Advanced Practice Providers (APPs -  Physician Assistants and Nurse Practitioners) who all work together to provide you with the care you need, when you need it.   Your next appointment:   5-6 month(s)  The format for your next appointment:   In Person  Provider:  K. Mali [name], MD  Other Instructions Monitor blood pressure at home. If Systolic (top number) blood pressure is persistently greater than 140 you may take an extra dose of Metoprolol Succinate (Toprol-XL) 25 mg  Continue physical therapy as scheduled
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
## 2021-07-16 DIAGNOSIS — J449 Chronic obstructive pulmonary disease, unspecified: Secondary | ICD-10-CM | POA: Diagnosis not present

## 2021-07-16 DIAGNOSIS — S76191D Other specified injury of right quadriceps muscle, fascia and tendon, subsequent encounter: Secondary | ICD-10-CM | POA: Diagnosis not present

## 2021-07-16 DIAGNOSIS — Z9181 History of falling: Secondary | ICD-10-CM | POA: Diagnosis not present

## 2021-07-16 DIAGNOSIS — I4891 Unspecified atrial fibrillation: Secondary | ICD-10-CM | POA: Diagnosis not present

## 2021-07-16 DIAGNOSIS — I10 Essential (primary) hypertension: Secondary | ICD-10-CM | POA: Diagnosis not present

## 2021-07-16 DIAGNOSIS — Z791 Long term (current) use of non-steroidal anti-inflammatories (NSAID): Secondary | ICD-10-CM | POA: Diagnosis not present

## 2021-07-16 DIAGNOSIS — Z79891 Long term (current) use of opiate analgesic: Secondary | ICD-10-CM | POA: Diagnosis not present

## 2021-07-16 DIAGNOSIS — M479 Spondylosis, unspecified: Secondary | ICD-10-CM | POA: Diagnosis not present

## 2021-07-16 DIAGNOSIS — M170 Bilateral primary osteoarthritis of knee: Secondary | ICD-10-CM | POA: Diagnosis not present

## 2021-07-17 ENCOUNTER — Encounter: Payer: Self-pay | Admitting: Physician Assistant

## 2021-07-19 DIAGNOSIS — S76191D Other specified injury of right quadriceps muscle, fascia and tendon, subsequent encounter: Secondary | ICD-10-CM | POA: Diagnosis not present

## 2021-07-19 DIAGNOSIS — I10 Essential (primary) hypertension: Secondary | ICD-10-CM | POA: Diagnosis not present

## 2021-07-19 DIAGNOSIS — J449 Chronic obstructive pulmonary disease, unspecified: Secondary | ICD-10-CM | POA: Diagnosis not present

## 2021-07-19 DIAGNOSIS — Z791 Long term (current) use of non-steroidal anti-inflammatories (NSAID): Secondary | ICD-10-CM | POA: Diagnosis not present

## 2021-07-19 DIAGNOSIS — M170 Bilateral primary osteoarthritis of knee: Secondary | ICD-10-CM | POA: Diagnosis not present

## 2021-07-19 DIAGNOSIS — M66251 Spontaneous rupture of extensor tendons, right thigh: Secondary | ICD-10-CM | POA: Diagnosis not present

## 2021-07-19 DIAGNOSIS — Z9181 History of falling: Secondary | ICD-10-CM | POA: Diagnosis not present

## 2021-07-19 DIAGNOSIS — M479 Spondylosis, unspecified: Secondary | ICD-10-CM | POA: Diagnosis not present

## 2021-07-19 DIAGNOSIS — I4891 Unspecified atrial fibrillation: Secondary | ICD-10-CM | POA: Diagnosis not present

## 2021-07-19 DIAGNOSIS — Z79891 Long term (current) use of opiate analgesic: Secondary | ICD-10-CM | POA: Diagnosis not present

## 2021-07-22 DIAGNOSIS — M170 Bilateral primary osteoarthritis of knee: Secondary | ICD-10-CM | POA: Diagnosis not present

## 2021-07-22 DIAGNOSIS — Z9181 History of falling: Secondary | ICD-10-CM | POA: Diagnosis not present

## 2021-07-22 DIAGNOSIS — Z79891 Long term (current) use of opiate analgesic: Secondary | ICD-10-CM | POA: Diagnosis not present

## 2021-07-22 DIAGNOSIS — I10 Essential (primary) hypertension: Secondary | ICD-10-CM | POA: Diagnosis not present

## 2021-07-22 DIAGNOSIS — M479 Spondylosis, unspecified: Secondary | ICD-10-CM | POA: Diagnosis not present

## 2021-07-22 DIAGNOSIS — I4891 Unspecified atrial fibrillation: Secondary | ICD-10-CM | POA: Diagnosis not present

## 2021-07-22 DIAGNOSIS — J449 Chronic obstructive pulmonary disease, unspecified: Secondary | ICD-10-CM | POA: Diagnosis not present

## 2021-07-22 DIAGNOSIS — S76191D Other specified injury of right quadriceps muscle, fascia and tendon, subsequent encounter: Secondary | ICD-10-CM | POA: Diagnosis not present

## 2021-07-22 DIAGNOSIS — Z791 Long term (current) use of non-steroidal anti-inflammatories (NSAID): Secondary | ICD-10-CM | POA: Diagnosis not present

## 2021-07-26 DIAGNOSIS — Z9181 History of falling: Secondary | ICD-10-CM | POA: Diagnosis not present

## 2021-07-26 DIAGNOSIS — I10 Essential (primary) hypertension: Secondary | ICD-10-CM | POA: Diagnosis not present

## 2021-07-26 DIAGNOSIS — M479 Spondylosis, unspecified: Secondary | ICD-10-CM | POA: Diagnosis not present

## 2021-07-26 DIAGNOSIS — S76191D Other specified injury of right quadriceps muscle, fascia and tendon, subsequent encounter: Secondary | ICD-10-CM | POA: Diagnosis not present

## 2021-07-26 DIAGNOSIS — Z791 Long term (current) use of non-steroidal anti-inflammatories (NSAID): Secondary | ICD-10-CM | POA: Diagnosis not present

## 2021-07-26 DIAGNOSIS — J449 Chronic obstructive pulmonary disease, unspecified: Secondary | ICD-10-CM | POA: Diagnosis not present

## 2021-07-26 DIAGNOSIS — Z79891 Long term (current) use of opiate analgesic: Secondary | ICD-10-CM | POA: Diagnosis not present

## 2021-07-26 DIAGNOSIS — M170 Bilateral primary osteoarthritis of knee: Secondary | ICD-10-CM | POA: Diagnosis not present

## 2021-07-26 DIAGNOSIS — I4891 Unspecified atrial fibrillation: Secondary | ICD-10-CM | POA: Diagnosis not present

## 2021-07-28 ENCOUNTER — Other Ambulatory Visit: Payer: Self-pay | Admitting: Internal Medicine

## 2021-07-29 ENCOUNTER — Encounter: Payer: Medicare PPO | Attending: Registered Nurse | Admitting: Registered Nurse

## 2021-07-29 ENCOUNTER — Telehealth: Payer: Self-pay | Admitting: Internal Medicine

## 2021-07-29 ENCOUNTER — Other Ambulatory Visit: Payer: Self-pay

## 2021-07-29 VITALS — BP 124/68 | HR 62 | Temp 98.5°F | Ht 74.0 in | Wt 263.2 lb

## 2021-07-29 DIAGNOSIS — G894 Chronic pain syndrome: Secondary | ICD-10-CM | POA: Insufficient documentation

## 2021-07-29 DIAGNOSIS — S76191D Other specified injury of right quadriceps muscle, fascia and tendon, subsequent encounter: Secondary | ICD-10-CM | POA: Diagnosis not present

## 2021-07-29 DIAGNOSIS — M170 Bilateral primary osteoarthritis of knee: Secondary | ICD-10-CM | POA: Diagnosis not present

## 2021-07-29 DIAGNOSIS — M546 Pain in thoracic spine: Secondary | ICD-10-CM | POA: Insufficient documentation

## 2021-07-29 DIAGNOSIS — G8929 Other chronic pain: Secondary | ICD-10-CM | POA: Diagnosis not present

## 2021-07-29 DIAGNOSIS — S8410XS Injury of peroneal nerve at lower leg level, unspecified leg, sequela: Secondary | ICD-10-CM | POA: Diagnosis present

## 2021-07-29 DIAGNOSIS — M79641 Pain in right hand: Secondary | ICD-10-CM | POA: Diagnosis not present

## 2021-07-29 DIAGNOSIS — M47817 Spondylosis without myelopathy or radiculopathy, lumbosacral region: Secondary | ICD-10-CM | POA: Insufficient documentation

## 2021-07-29 DIAGNOSIS — M479 Spondylosis, unspecified: Secondary | ICD-10-CM | POA: Diagnosis not present

## 2021-07-29 DIAGNOSIS — M25512 Pain in left shoulder: Secondary | ICD-10-CM | POA: Insufficient documentation

## 2021-07-29 DIAGNOSIS — M542 Cervicalgia: Secondary | ICD-10-CM | POA: Insufficient documentation

## 2021-07-29 DIAGNOSIS — I4891 Unspecified atrial fibrillation: Secondary | ICD-10-CM | POA: Diagnosis not present

## 2021-07-29 DIAGNOSIS — Z5181 Encounter for therapeutic drug level monitoring: Secondary | ICD-10-CM | POA: Diagnosis not present

## 2021-07-29 DIAGNOSIS — Z79891 Long term (current) use of opiate analgesic: Secondary | ICD-10-CM | POA: Diagnosis not present

## 2021-07-29 DIAGNOSIS — Z791 Long term (current) use of non-steroidal anti-inflammatories (NSAID): Secondary | ICD-10-CM | POA: Diagnosis not present

## 2021-07-29 DIAGNOSIS — I10 Essential (primary) hypertension: Secondary | ICD-10-CM | POA: Diagnosis not present

## 2021-07-29 DIAGNOSIS — M25511 Pain in right shoulder: Secondary | ICD-10-CM | POA: Insufficient documentation

## 2021-07-29 DIAGNOSIS — J449 Chronic obstructive pulmonary disease, unspecified: Secondary | ICD-10-CM | POA: Diagnosis not present

## 2021-07-29 DIAGNOSIS — Z9181 History of falling: Secondary | ICD-10-CM | POA: Diagnosis not present

## 2021-07-29 NOTE — Telephone Encounter (Signed)
Kristopher Gay with [facility]  called in to report bp   Pt c/o BP issue: STAT if pt c/o blurred vision, one-sided weakness or slurred speech  1. What are your last 5 BP readings?164/90  2. Are you having any other symptoms (ex. Dizziness, headache, blurred vision, passed out)? Per kelly no aother symptoms   3. What is your BP issue? Bp is a little high this morning     Pt is a little calmy , but the inside of the house is a little warm.  She just had to call and report his bp   Best  number [phone]

## 2021-07-29 NOTE — Telephone Encounter (Signed)
Returned call to [employer] with [facility] left message on personal voice mail to call back.
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
## 2021-07-30 ENCOUNTER — Telehealth: Payer: Self-pay | Admitting: Internal Medicine

## 2021-07-30 ENCOUNTER — Encounter: Payer: Self-pay | Admitting: Registered Nurse

## 2021-07-30 NOTE — Telephone Encounter (Signed)
Last office note faxed to the number provided.

## 2021-07-30 NOTE — Telephone Encounter (Signed)
Received phone call from [name], [facility] Physical Therapist, and he wants the AVS to the last office visit of patient faxed over to him at [phone]

## 2021-07-30 NOTE — Telephone Encounter (Signed)
Left message for kelly to call

## 2021-07-30 NOTE — Telephone Encounter (Signed)
Patient states he had his BP checked the same day at pain Management and it was normal. He states the nurse there told him the physical therapist may be using a BP machine that is too small or large for his arm. He says when he goes to any doctor's office it is always normal. He says when the reading with the physical therapist is high it cuts into the time for his physical therapy. He says he does not need a call back, just wanted this documented.

## 2021-07-31 DIAGNOSIS — M170 Bilateral primary osteoarthritis of knee: Secondary | ICD-10-CM | POA: Diagnosis not present

## 2021-07-31 DIAGNOSIS — S76191D Other specified injury of right quadriceps muscle, fascia and tendon, subsequent encounter: Secondary | ICD-10-CM | POA: Diagnosis not present

## 2021-07-31 DIAGNOSIS — M479 Spondylosis, unspecified: Secondary | ICD-10-CM | POA: Diagnosis not present

## 2021-07-31 DIAGNOSIS — J449 Chronic obstructive pulmonary disease, unspecified: Secondary | ICD-10-CM | POA: Diagnosis not present

## 2021-07-31 DIAGNOSIS — I4891 Unspecified atrial fibrillation: Secondary | ICD-10-CM | POA: Diagnosis not present

## 2021-07-31 DIAGNOSIS — I10 Essential (primary) hypertension: Secondary | ICD-10-CM | POA: Diagnosis not present

## 2021-07-31 DIAGNOSIS — Z79891 Long term (current) use of opiate analgesic: Secondary | ICD-10-CM | POA: Diagnosis not present

## 2021-07-31 DIAGNOSIS — Z9181 History of falling: Secondary | ICD-10-CM | POA: Diagnosis not present

## 2021-07-31 DIAGNOSIS — Z791 Long term (current) use of non-steroidal anti-inflammatories (NSAID): Secondary | ICD-10-CM | POA: Diagnosis not present

## 2021-08-02 DIAGNOSIS — Z9181 History of falling: Secondary | ICD-10-CM | POA: Diagnosis not present

## 2021-08-02 DIAGNOSIS — J449 Chronic obstructive pulmonary disease, unspecified: Secondary | ICD-10-CM | POA: Diagnosis not present

## 2021-08-02 DIAGNOSIS — Z791 Long term (current) use of non-steroidal anti-inflammatories (NSAID): Secondary | ICD-10-CM | POA: Diagnosis not present

## 2021-08-02 DIAGNOSIS — M479 Spondylosis, unspecified: Secondary | ICD-10-CM | POA: Diagnosis not present

## 2021-08-02 DIAGNOSIS — I4891 Unspecified atrial fibrillation: Secondary | ICD-10-CM | POA: Diagnosis not present

## 2021-08-02 DIAGNOSIS — M170 Bilateral primary osteoarthritis of knee: Secondary | ICD-10-CM | POA: Diagnosis not present

## 2021-08-02 DIAGNOSIS — Z79891 Long term (current) use of opiate analgesic: Secondary | ICD-10-CM | POA: Diagnosis not present

## 2021-08-02 DIAGNOSIS — I10 Essential (primary) hypertension: Secondary | ICD-10-CM | POA: Diagnosis not present

## 2021-08-02 DIAGNOSIS — S76191D Other specified injury of right quadriceps muscle, fascia and tendon, subsequent encounter: Secondary | ICD-10-CM | POA: Diagnosis not present

## 2021-08-02 LAB — TOXASSURE SELECT,+ANTIDEPR,UR

## 2021-08-05 ENCOUNTER — Telehealth: Payer: Self-pay | Admitting: *Deleted

## 2021-08-05 DIAGNOSIS — Z79891 Long term (current) use of opiate analgesic: Secondary | ICD-10-CM | POA: Diagnosis not present

## 2021-08-05 DIAGNOSIS — M170 Bilateral primary osteoarthritis of knee: Secondary | ICD-10-CM | POA: Diagnosis not present

## 2021-08-05 DIAGNOSIS — Z9181 History of falling: Secondary | ICD-10-CM | POA: Diagnosis not present

## 2021-08-05 DIAGNOSIS — I10 Essential (primary) hypertension: Secondary | ICD-10-CM | POA: Diagnosis not present

## 2021-08-05 DIAGNOSIS — M479 Spondylosis, unspecified: Secondary | ICD-10-CM | POA: Diagnosis not present

## 2021-08-05 DIAGNOSIS — J449 Chronic obstructive pulmonary disease, unspecified: Secondary | ICD-10-CM | POA: Diagnosis not present

## 2021-08-05 DIAGNOSIS — Z791 Long term (current) use of non-steroidal anti-inflammatories (NSAID): Secondary | ICD-10-CM | POA: Diagnosis not present

## 2021-08-05 DIAGNOSIS — I4891 Unspecified atrial fibrillation: Secondary | ICD-10-CM | POA: Diagnosis not present

## 2021-08-05 DIAGNOSIS — S76191D Other specified injury of right quadriceps muscle, fascia and tendon, subsequent encounter: Secondary | ICD-10-CM | POA: Diagnosis not present

## 2021-08-05 NOTE — Telephone Encounter (Signed)
Urine drug screen for this encounter is consistent for prescribed medication 

## 2021-08-06 DIAGNOSIS — M170 Bilateral primary osteoarthritis of knee: Secondary | ICD-10-CM | POA: Diagnosis not present

## 2021-08-06 DIAGNOSIS — I4891 Unspecified atrial fibrillation: Secondary | ICD-10-CM | POA: Diagnosis not present

## 2021-08-06 DIAGNOSIS — Z9181 History of falling: Secondary | ICD-10-CM | POA: Diagnosis not present

## 2021-08-06 DIAGNOSIS — J449 Chronic obstructive pulmonary disease, unspecified: Secondary | ICD-10-CM | POA: Diagnosis not present

## 2021-08-06 DIAGNOSIS — M479 Spondylosis, unspecified: Secondary | ICD-10-CM | POA: Diagnosis not present

## 2021-08-06 DIAGNOSIS — I10 Essential (primary) hypertension: Secondary | ICD-10-CM | POA: Diagnosis not present

## 2021-08-06 DIAGNOSIS — Z79891 Long term (current) use of opiate analgesic: Secondary | ICD-10-CM | POA: Diagnosis not present

## 2021-08-06 DIAGNOSIS — S76191D Other specified injury of right quadriceps muscle, fascia and tendon, subsequent encounter: Secondary | ICD-10-CM | POA: Diagnosis not present

## 2021-08-06 DIAGNOSIS — Z791 Long term (current) use of non-steroidal anti-inflammatories (NSAID): Secondary | ICD-10-CM | POA: Diagnosis not present

## 2021-08-09 DIAGNOSIS — Z791 Long term (current) use of non-steroidal anti-inflammatories (NSAID): Secondary | ICD-10-CM | POA: Diagnosis not present

## 2021-08-09 DIAGNOSIS — I4891 Unspecified atrial fibrillation: Secondary | ICD-10-CM | POA: Diagnosis not present

## 2021-08-09 DIAGNOSIS — S76191D Other specified injury of right quadriceps muscle, fascia and tendon, subsequent encounter: Secondary | ICD-10-CM | POA: Diagnosis not present

## 2021-08-09 DIAGNOSIS — Z9181 History of falling: Secondary | ICD-10-CM | POA: Diagnosis not present

## 2021-08-09 DIAGNOSIS — Z79891 Long term (current) use of opiate analgesic: Secondary | ICD-10-CM | POA: Diagnosis not present

## 2021-08-09 DIAGNOSIS — M170 Bilateral primary osteoarthritis of knee: Secondary | ICD-10-CM | POA: Diagnosis not present

## 2021-08-09 DIAGNOSIS — M479 Spondylosis, unspecified: Secondary | ICD-10-CM | POA: Diagnosis not present

## 2021-08-09 DIAGNOSIS — J449 Chronic obstructive pulmonary disease, unspecified: Secondary | ICD-10-CM | POA: Diagnosis not present

## 2021-08-09 DIAGNOSIS — I10 Essential (primary) hypertension: Secondary | ICD-10-CM | POA: Diagnosis not present

## 2021-08-12 DIAGNOSIS — J449 Chronic obstructive pulmonary disease, unspecified: Secondary | ICD-10-CM | POA: Diagnosis not present

## 2021-08-12 DIAGNOSIS — I10 Essential (primary) hypertension: Secondary | ICD-10-CM | POA: Diagnosis not present

## 2021-08-12 DIAGNOSIS — Z9181 History of falling: Secondary | ICD-10-CM | POA: Diagnosis not present

## 2021-08-12 DIAGNOSIS — M170 Bilateral primary osteoarthritis of knee: Secondary | ICD-10-CM | POA: Diagnosis not present

## 2021-08-12 DIAGNOSIS — Z791 Long term (current) use of non-steroidal anti-inflammatories (NSAID): Secondary | ICD-10-CM | POA: Diagnosis not present

## 2021-08-12 DIAGNOSIS — S76191D Other specified injury of right quadriceps muscle, fascia and tendon, subsequent encounter: Secondary | ICD-10-CM | POA: Diagnosis not present

## 2021-08-12 DIAGNOSIS — M479 Spondylosis, unspecified: Secondary | ICD-10-CM | POA: Diagnosis not present

## 2021-08-12 DIAGNOSIS — I4891 Unspecified atrial fibrillation: Secondary | ICD-10-CM | POA: Diagnosis not present

## 2021-08-12 DIAGNOSIS — Z79891 Long term (current) use of opiate analgesic: Secondary | ICD-10-CM | POA: Diagnosis not present

## 2021-08-13 ENCOUNTER — Telehealth: Payer: Self-pay

## 2021-08-13 DIAGNOSIS — J449 Chronic obstructive pulmonary disease, unspecified: Secondary | ICD-10-CM | POA: Diagnosis not present

## 2021-08-13 DIAGNOSIS — M479 Spondylosis, unspecified: Secondary | ICD-10-CM | POA: Diagnosis not present

## 2021-08-13 DIAGNOSIS — M170 Bilateral primary osteoarthritis of knee: Secondary | ICD-10-CM | POA: Diagnosis not present

## 2021-08-13 DIAGNOSIS — I10 Essential (primary) hypertension: Secondary | ICD-10-CM | POA: Diagnosis not present

## 2021-08-13 DIAGNOSIS — S76191D Other specified injury of right quadriceps muscle, fascia and tendon, subsequent encounter: Secondary | ICD-10-CM | POA: Diagnosis not present

## 2021-08-13 DIAGNOSIS — Z791 Long term (current) use of non-steroidal anti-inflammatories (NSAID): Secondary | ICD-10-CM | POA: Diagnosis not present

## 2021-08-13 DIAGNOSIS — Z9181 History of falling: Secondary | ICD-10-CM | POA: Diagnosis not present

## 2021-08-13 DIAGNOSIS — Z79891 Long term (current) use of opiate analgesic: Secondary | ICD-10-CM | POA: Diagnosis not present

## 2021-08-13 DIAGNOSIS — I4891 Unspecified atrial fibrillation: Secondary | ICD-10-CM | POA: Diagnosis not present

## 2021-08-13 NOTE — Telephone Encounter (Signed)
Pt would benefit for a follow up from [facility]. LL extremity swelling with indentation . Scraps on both legs  doesn't remember doing  it . 50% sensation from the knee distal on both legs.  Will call pt to advise of the ned of appointment. [place]

## 2021-08-13 NOTE — Telephone Encounter (Signed)
Pt was called to advise the need for a office visit  for follow up on discharge from the skill nursing facility.

## 2021-08-15 NOTE — Telephone Encounter (Signed)
LVM for pt to call back and schedule a appointment with Dr. Redmond School per home health request.  Second attempt [facility]

## 2021-08-15 NOTE — Telephone Encounter (Signed)
Mailed  letter [employer]

## 2021-08-16 DIAGNOSIS — M479 Spondylosis, unspecified: Secondary | ICD-10-CM | POA: Diagnosis not present

## 2021-08-16 DIAGNOSIS — M170 Bilateral primary osteoarthritis of knee: Secondary | ICD-10-CM | POA: Diagnosis not present

## 2021-08-16 DIAGNOSIS — Z9181 History of falling: Secondary | ICD-10-CM | POA: Diagnosis not present

## 2021-08-16 DIAGNOSIS — S76191D Other specified injury of right quadriceps muscle, fascia and tendon, subsequent encounter: Secondary | ICD-10-CM | POA: Diagnosis not present

## 2021-08-16 DIAGNOSIS — Z79891 Long term (current) use of opiate analgesic: Secondary | ICD-10-CM | POA: Diagnosis not present

## 2021-08-16 DIAGNOSIS — Z791 Long term (current) use of non-steroidal anti-inflammatories (NSAID): Secondary | ICD-10-CM | POA: Diagnosis not present

## 2021-08-16 DIAGNOSIS — J449 Chronic obstructive pulmonary disease, unspecified: Secondary | ICD-10-CM | POA: Diagnosis not present

## 2021-08-16 DIAGNOSIS — I4891 Unspecified atrial fibrillation: Secondary | ICD-10-CM | POA: Diagnosis not present

## 2021-08-16 DIAGNOSIS — I10 Essential (primary) hypertension: Secondary | ICD-10-CM | POA: Diagnosis not present

## 2021-08-21 DIAGNOSIS — J449 Chronic obstructive pulmonary disease, unspecified: Secondary | ICD-10-CM | POA: Diagnosis not present

## 2021-08-21 DIAGNOSIS — I10 Essential (primary) hypertension: Secondary | ICD-10-CM | POA: Diagnosis not present

## 2021-08-21 DIAGNOSIS — S76191D Other specified injury of right quadriceps muscle, fascia and tendon, subsequent encounter: Secondary | ICD-10-CM | POA: Diagnosis not present

## 2021-08-21 DIAGNOSIS — I4891 Unspecified atrial fibrillation: Secondary | ICD-10-CM | POA: Diagnosis not present

## 2021-08-21 DIAGNOSIS — Z9181 History of falling: Secondary | ICD-10-CM | POA: Diagnosis not present

## 2021-08-21 DIAGNOSIS — M479 Spondylosis, unspecified: Secondary | ICD-10-CM | POA: Diagnosis not present

## 2021-08-21 DIAGNOSIS — Z791 Long term (current) use of non-steroidal anti-inflammatories (NSAID): Secondary | ICD-10-CM | POA: Diagnosis not present

## 2021-08-21 DIAGNOSIS — M170 Bilateral primary osteoarthritis of knee: Secondary | ICD-10-CM | POA: Diagnosis not present

## 2021-08-21 DIAGNOSIS — Z79891 Long term (current) use of opiate analgesic: Secondary | ICD-10-CM | POA: Diagnosis not present

## 2021-08-29 ENCOUNTER — Emergency Department (HOSPITAL_COMMUNITY)
Admission: EM | Admit: 2021-08-29 | Discharge: 2021-08-29 | Disposition: A | Discharge status: Home / Self Care | Payer: Medicare PPO | Attending: Emergency Medicine | Admitting: Emergency Medicine

## 2021-08-29 DIAGNOSIS — I10 Essential (primary) hypertension: Secondary | ICD-10-CM | POA: Diagnosis not present

## 2021-08-29 DIAGNOSIS — M479 Spondylosis, unspecified: Secondary | ICD-10-CM | POA: Diagnosis not present

## 2021-08-29 DIAGNOSIS — Z79899 Other long term (current) drug therapy: Secondary | ICD-10-CM | POA: Insufficient documentation

## 2021-08-29 DIAGNOSIS — S76191D Other specified injury of right quadriceps muscle, fascia and tendon, subsequent encounter: Secondary | ICD-10-CM | POA: Diagnosis not present

## 2021-08-29 DIAGNOSIS — L03116 Cellulitis of left lower limb: Secondary | ICD-10-CM | POA: Diagnosis not present

## 2021-08-29 DIAGNOSIS — X58XXXA Exposure to other specified factors, initial encounter: Secondary | ICD-10-CM | POA: Diagnosis not present

## 2021-08-29 DIAGNOSIS — J449 Chronic obstructive pulmonary disease, unspecified: Secondary | ICD-10-CM | POA: Diagnosis not present

## 2021-08-29 DIAGNOSIS — S81802A Unspecified open wound, left lower leg, initial encounter: Secondary | ICD-10-CM | POA: Diagnosis not present

## 2021-08-29 DIAGNOSIS — Z791 Long term (current) use of non-steroidal anti-inflammatories (NSAID): Secondary | ICD-10-CM | POA: Diagnosis not present

## 2021-08-29 DIAGNOSIS — I480 Paroxysmal atrial fibrillation: Secondary | ICD-10-CM | POA: Diagnosis not present

## 2021-08-29 DIAGNOSIS — R609 Edema, unspecified: Secondary | ICD-10-CM | POA: Diagnosis not present

## 2021-08-29 DIAGNOSIS — J45909 Unspecified asthma, uncomplicated: Secondary | ICD-10-CM | POA: Diagnosis not present

## 2021-08-29 DIAGNOSIS — Z79891 Long term (current) use of opiate analgesic: Secondary | ICD-10-CM | POA: Diagnosis not present

## 2021-08-29 DIAGNOSIS — R600 Localized edema: Secondary | ICD-10-CM | POA: Diagnosis not present

## 2021-08-29 DIAGNOSIS — I4891 Unspecified atrial fibrillation: Secondary | ICD-10-CM | POA: Diagnosis not present

## 2021-08-29 DIAGNOSIS — Z9181 History of falling: Secondary | ICD-10-CM | POA: Diagnosis not present

## 2021-08-29 DIAGNOSIS — M170 Bilateral primary osteoarthritis of knee: Secondary | ICD-10-CM | POA: Diagnosis not present

## 2021-08-29 LAB — CBC WITH DIFFERENTIAL/PLATELET
Abs Immature Granulocytes: 0.02 10*3/uL (ref 0.00–0.07)
Basophils Absolute: 0 10*3/uL (ref 0.0–0.1)
Basophils Relative: 0 %
Eosinophils Absolute: 0.1 10*3/uL (ref 0.0–0.5)
Eosinophils Relative: 2 %
HCT: 39.4 % (ref 39.0–52.0)
Hemoglobin: 13.6 g/dL (ref 13.0–17.0)
Immature Granulocytes: 0 %
Lymphocytes Relative: 20 %
Lymphs Abs: 1.5 10*3/uL (ref 0.7–4.0)
MCH: 32.7 pg (ref 26.0–34.0)
MCHC: 34.5 g/dL (ref 30.0–36.0)
MCV: 94.7 fL (ref 80.0–100.0)
Monocytes Absolute: 0.6 10*3/uL (ref 0.1–1.0)
Monocytes Relative: 8 %
Neutro Abs: 5.1 10*3/uL (ref 1.7–7.7)
Neutrophils Relative %: 70 %
Platelets: 162 10*3/uL (ref 150–400)
RBC: 4.16 MIL/uL — ABNORMAL LOW (ref 4.22–5.81)
RDW: 13.1 % (ref 11.5–15.5)
WBC: 7.3 10*3/uL (ref 4.0–10.5)
nRBC: 0 % (ref 0.0–0.2)

## 2021-08-29 LAB — COMPREHENSIVE METABOLIC PANEL
ALT: 33 U/L (ref 0–44)
AST: 34 U/L (ref 15–41)
Albumin: 4.1 g/dL (ref 3.5–5.0)
Alkaline Phosphatase: 72 U/L (ref 38–126)
Anion gap: 8 (ref 5–15)
BUN: 18 mg/dL (ref 8–23)
CO2: 23 mmol/L (ref 22–32)
Calcium: 9 mg/dL (ref 8.9–10.3)
Chloride: 108 mmol/L (ref 98–111)
Creatinine, Ser: 0.68 mg/dL (ref 0.61–1.24)
GFR, Estimated: >60 mL/min (ref >60)
Glucose, Bld: 117 mg/dL — ABNORMAL HIGH (ref 70–99)
Potassium: 3.8 mmol/L (ref 3.5–5.1)
Sodium: 139 mmol/L (ref 135–145)
Total Bilirubin: 1 mg/dL (ref 0.3–1.2)
Total Protein: 7.1 g/dL (ref 6.5–8.1)

## 2021-08-29 LAB — BRAIN NATRIURETIC PEPTIDE: B Natriuretic Peptide: 82.9 pg/mL (ref 0.0–100.0)

## 2021-08-29 MED ORDER — DOXYCYCLINE HYCLATE 100 MG PO TABS
100.0000 mg | ORAL_TABLET | Freq: Once | ORAL | Status: AC
  Administered 2021-08-29: 100 mg via ORAL
  Filled 2021-08-29: qty 1

## 2021-08-29 MED ORDER — DOXYCYCLINE HYCLATE 100 MG PO CAPS: 100.0000 mg | ORAL_CAPSULE | Freq: Two times a day (BID) | ORAL | 0 refills | Status: DC

## 2021-08-29 NOTE — ED Triage Notes (Addendum)
Patient arrives from home with complaint of open wound to left leg and leg swelling that pt states has been ongoing for some time. Pt reports wound began x 2 weeks ago.  Denies fever.

## 2021-08-29 NOTE — ED Provider Notes (Signed)
[facility] DEPT Provider Note   CSN: [id number] Arrival date & time: 08/29/21  2000     History Chief Complaint  Patient presents with   leg wound    Kristopher Gay is a 70 y.o. male.  The history is provided by the patient and medical records.  Kristopher Gay is a 70 y.o. male who presents to the Emergency Department complaining of leg wound. He has chronic bilateral lower extremity edema for the last 1 to 2 years following a leg injury. He wears a knee brace for quadriceps rupture status post multiple repairs. He states that he injured his left leg about 7 to 10 days ago and sustained a scratch. He suspects it was related to the brace for his right leg. He presents today for increased pain and swelling to that leg for the last two days. He has associated itching. No fevers, chest pain, difficulty breathing.     Past Medical History:  Diagnosis Date   A-fib [facility])    Allergic rhinitis, cause unspecified    Allergy    Anxiety    Blood clot in vein 2002   left calf SUPERFICIAL CLOT REMOVED THEN PART OF VEIN REMOVED   Chronic headaches    COPD (chronic obstructive pulmonary disease) (HCC)    Cough    X 1 YEAR NO FEVER CLEAR SPUTUM   DDD (degenerative disc disease)    Depression    DJD (degenerative joint disease)    ALL THE WAY DOWN SPINE   Dropfoot    LEFT , NO BRACE WORN NOW   Dysrhythmia    a fib one time after hernia surgery   Emphysema of lung (HCC)    Extrinsic asthma, unspecified    GERD (gastroesophageal reflux disease)    OCC NO MEDS FOR   Hypertension    STOPPED HTN MEDS UNTIL 2011, THEN HAD WEIGHT LOSS, NO MEDS SINCE   Neuromuscular disorder (HCC)    neuropathy  legs and feet   Neuropathy    POLYNEUOPATHOPATHY FEET AND LEGS   Peripheral vascular disease (HCC)    VARICOSE VEINS LEFT LEG   Pneumonia AS CHILD   PONV (postoperative nausea and vomiting)    Syncope    Ulnar nerve entrapment at elbow, right    Umbilical hernia     Unspecified asthma(493.90)     Patient Active Problem List   Diagnosis Date Noted   Rupture of quadriceps tendon, right, sequela 02/26/2021   Rupture of right quadriceps tendon 08/07/2020   Ulnar neuropathy at elbow, right 05/03/2018   Neuritis of right ulnar nerve 04/27/2018   PAF (paroxysmal atrial fibrillation) ([place]) 02/20/2018   Chronic right shoulder pain 05/15/2016   Right knee pain 05/15/2016   Adrenal adenoma 02/20/2016   Chronic pain syndrome 10/12/2015   Dysphagia, pharyngoesophageal phase 09/14/2014   Other chronic postoperative pain 01/16/2014   Allergy, insect bite 07/02/2012   GERD (gastroesophageal reflux disease) 03/25/2012   Injury to peroneal nerve 03/12/2012   Lumbosacral spondylosis 01/13/2012   Chronic back pain 11/17/2011   Seasonal and perennial allergic rhinitis 12/26/2010   Chronic pain 04/16/2009   COPD (chronic obstructive pulmonary disease) ([place]) 01/30/2008    Past Surgical History:  Procedure Laterality Date   COLONSCOPY  06/16/2017   HERNIA REPAIR     INGUINAL HERNIA REPAIR Bilateral 07/20/2017   Procedure: LAPAROSCOPIC BILATERAL INGUINAL HERNIA REPAIR WITH MESH, UMBILICAL HERNIA REPAIR AND LYSIS OF ADHESIONS;  Surgeon: Kieth Brightly, [name], MD;  Location: [facility];  Service: General;  Laterality: Bilateral;   LAPAROSCOPY N/A 07/20/2017   Procedure: LAPAROSCOPY DIAGNOSTIC;  Surgeon: Kieth Brightly [name], MD;  [facility];  Service: General;  Laterality: N/A;   QUADRICEPS TENDON REPAIR Right 08/07/2020   Procedure: REPAIR QUADRICEP TENDON;  Surgeon: Renette Butters, MD;  [facility];  Service: Orthopedics;  Laterality: Right;  NEED RAPID TEST;SAME DAY WORKUP; COMING FROM [facility]   QUADRICEPS TENDON REPAIR Right 02/26/2021   Procedure: REPAIR QUADRICEP TENDON;  Surgeon: Renette Butters, MD;  [facility];  Service: Orthopedics;  Laterality: Right;   ROTATOR CUFF REPAIR Left 2005   detached; left shoulder-reattached    SHOULDER SURGERY Right    laBRAL  tendon torn   SUPERFICIAL LEFT LEG VEIN STRIPPING WITH SMALL SUPERFICIAL CLOT  2002   ULNAR NERVE TRANSPOSITION Right 05/10/2018   Procedure: RIGHT ELBOW ULNAR NERVE RELEASE;  Surgeon: Iran Planas, MD;  Location: [place];  Service: Orthopedics;  Laterality: Right;   UMBILICAL HERNIA REPAIR  39/76/7341   UMBILICAL HERNIA REPAIR N/A 02/18/2018   Procedure: LAPAROSCOPIC UMBILICAL HERNIA REPAIR ERAS PATHWAY;  Surgeon: [name], [name], MD;  Location: [place];  Service: General;  Laterality: N/A;       Family History  Problem Relation Age of Onset   Pancreatic cancer Mother    Breast cancer Sister    Epilepsy Brother    Adrenal disorder Neg Hx     Social History   Tobacco Use   Smoking status: Never   Smokeless tobacco: Never  Vaping Use   Vaping Use: Never used  Substance Use Topics   Alcohol use: No    Alcohol/week: 0.0 standard drinks   Drug use: No    Home Medications Prior to Admission medications   Medication Sig Start Date End Date Taking? Authorizing Provider  doxycycline (VIBRAMYCIN) 100 MG capsule Take 1 capsule (100 mg total) by mouth 2 (two) times daily. 08/29/21  Yes Quintella Reichert, MD  acetaminophen (TYLENOL) 500 MG tablet Take 2 tablets (1,000 mg total) by mouth every 6 (six) hours as needed for mild pain or moderate pain. 03/01/21   Britt Bottom, PA-C  ascorbic acid (VITAMIN C) 1000 MG tablet Take 2,000 mg by mouth daily.    [provider]  atorvastatin (LIPITOR) 40 MG tablet TAKE 1 TABLET(40 MG) BY MOUTH DAILY 04/01/21   [name], Nadean Corwin, MD  Azelastine-Fluticasone 137-50 MCG/ACT SUSP Place 1 spray into the nose as needed. Patient taking differently: Place 1 spray into the nose in the morning and at bedtime. 01/17/21   Dara Hoyer, FNP  Calcium Carbonate-Vit D-Min (CALCIUM 1200 PO) Take 1 tablet by mouth in the morning and at bedtime.    [provider]  celecoxib (CELEBREX) 100 MG capsule TAKE 1 CAPSULE BY  MOUTH TWICE DAILY 05/23/21   Bayard Hugger, NP  Cholecalciferol (VITAMIN D3) 50 MCG (2000 UT) capsule Take 2,000 Units by mouth in the morning and at bedtime.    [provider]  cyanocobalamin 1000 MCG tablet Take 1,000 mcg by mouth in the morning and at bedtime.    [provider]  DHEA 25 MG CAPS Take 25 mg by mouth daily.    [provider]  EPINEPHrine 0.3 mg/0.3 mL IJ SOAJ injection Inject 0.3 mg into the muscle as needed for anaphylaxis.    [provider]  Fluticasone-Umeclidin-Vilant (TRELEGY ELLIPTA) 100-62.5-25 MCG/INH AEPB INHALE 1 PUFF INTO THE LUNGS EVERY DAY Patient taking  differently: Inhale 1 puff into the lungs daily. 11/22/20   Parrett, Fonnie Mu, NP  furosemide (LASIX) 20 MG tablet TAKE 1 TABLET(20 MG) BY MOUTH DAILY 04/19/21   [name], MD  gabapentin (NEURONTIN) 300 MG capsule Take 2 capsule in the morning. Take 1 capsule Mid-day and 2 capsule at bedtime. Patient taking differently: Take 300-600 mg by mouth See admin instructions. Take 600 mg by mouth in the morning 300 mg Mid-day and 600 mg at bedtime. 01/25/21   Bayard Hugger, NP  Glucosamine HCl-MSM (GLUCOSAMINE-MSM PO) Take 1 tablet by mouth in the morning and at bedtime.    [provider]  Magnesium 250 MG TABS Take 250 mg by mouth daily.    [provider]  metoprolol succinate (TOPROL-XL) 25 MG 24 hr tablet Take 1 tablet (25 mg total) by mouth daily. 07/29/21   [name], Nadean Corwin, MD  montelukast (SINGULAIR) 10 MG tablet Take 1 tablet (10 mg total) by mouth daily. 01/17/21   Dara Hoyer, FNP  Multiple Vitamin (MULTIVITAMIN) tablet Take 1 tablet by mouth 2 (two) times daily.     [provider]  nitroGLYCERIN (NITROSTAT) 0.4 MG SL tablet Place 0.4 mg under the tongue every 5 (five) minutes as needed for chest pain.    [provider]  Omega-3 Fatty Acids (FISH OIL) 1200 MG CAPS Take 1,200 mg by mouth in the morning and at bedtime.    [provider]  traMADol (ULTRAM) 50 MG tablet Take two tablets( 100 mg) every 6 hours as needed for pain. 07/05/21   Bayard Hugger, NP  vitamin E 180 MG (400 UNITS) capsule Take 400 Units by mouth in the morning and at bedtime.    [provider]    Allergies    Bee venom, Linzess [linaclotide], Molds & smuts, and Seasonal ic [cholestatin]  Review of Systems   Review of Systems  All other systems reviewed and are negative.  Physical Exam Updated Vital Signs BP 134/73   Pulse 60   Temp 98 F (36.7 C) (Oral)   Resp 16   Ht 6\' 2"  (1.88 m)   Wt 113.4 kg   SpO2 96%   BMI 32.10 kg/m   Physical Exam Vitals and nursing note reviewed.  Constitutional:      Appearance: He is well-developed.  HENT:     Head: Normocephalic and atraumatic.  Cardiovascular:     Rate and Rhythm: Normal rate and regular rhythm.     Heart sounds: No murmur heard. Pulmonary:     Effort: Pulmonary effort is normal. No respiratory distress.     Breath sounds: Normal breath sounds.  Abdominal:     Palpations: Abdomen is soft.     Tenderness: There is no abdominal tenderness. There is no guarding or rebound.  Musculoskeletal:        General: No tenderness.     Comments: 2+ DP pulses bilaterally. Trace pitting edema to the right lower extremity. There is 2+ pitting edema to the left lower extremity. There is a healing wound to the left medial malleolus. There is surrounding erythema without local exudate. Erythema does extend to the proximal distal lower extremity.  Skin:    General: Skin is warm and dry.  Neurological:     Mental Status: He is alert and oriented to person, place, and time.  Psychiatric:        Behavior: Behavior normal.    ED Results / Procedures / Treatments  Labs (all labs ordered are listed, but only abnormal results are displayed) Labs Reviewed  COMPREHENSIVE METABOLIC PANEL - Abnormal; Notable for the following components:      Result Value   Glucose, Bld 117 (*)     All other components within normal limits  CBC WITH DIFFERENTIAL/PLATELET - Abnormal; Notable for the following components:   RBC 4.16 (*)    All other components within normal limits  BRAIN NATRIURETIC PEPTIDE    EKG None  Radiology No results found.  Procedures Procedures   Medications Ordered in ED Medications  doxycycline (VIBRA-TABS) tablet 100 mg (has no administration in time range)    ED Course  I have reviewed the triage vital signs and the nursing notes.  Pertinent labs & imaging results that were available during my care of the patient were reviewed by me and considered in my medical decision making (see chart for details).    MDM Rules/Calculators/A&P                          patient here for evaluation of increased pain and swelling to the left lower extremity. He does have a wound in this area was local erythema concerning for cellulitis. He is non-toxic appearing on evaluation. No evidence of sepsis, necrotizing soft tissue infection, acute CHF. Will start antibiotics. Discussed with patient recommendation to obtain vascular ultrasound to rule out DVT. Patient declines DVT ultrasound. Plan to discharge home with oral antibiotics and return precautions.  Final Clinical Impression(s) / ED Diagnoses Final diagnoses:  Cellulitis of left lower extremity  Lower extremity edema    Rx / DC Orders ED Discharge Orders          Ordered    doxycycline (VIBRAMYCIN) 100 MG capsule  2 times daily        08/29/21 2333             Quintella Reichert, MD 08/29/21 2341

## 2021-08-29 NOTE — ED Provider Notes (Signed)
Emergency Medicine Provider Triage Evaluation Note  Kristopher Gay , a 70 y.o. male  was evaluated in triage.  Pt complains of leg swelling bilaterally and wound on the left leg.  He reports that he had a scab on his medial left ankle by the malleoli.   His legs are swollen bilaterally but not more than usual.  No fevers.    Review of Systems  Positive: Wound on leg, leg swelling bilaterally Negative: Fevers.   Physical Exam  BP 134/67 (BP Location: Left Arm)   Pulse 65   Temp 98 F (36.7 C) (Oral)   Resp 17   Ht 6\' 2"  (1.88 m)   Wt 113.4 kg   SpO2 96%   BMI 32.10 kg/m  Gen:   Awake, no distress   Resp:  Normal effort  MSK:   Moves extremities without difficulty  Other:  3cm wound on medial left ankle  Medical Decision Making  Medically screening exam initiated at 8:15 PM.  Appropriate orders placed.  Kristopher Gay was informed that the remainder of the evaluation will be completed by another provider, this initial triage assessment does not replace that evaluation, and the importance of remaining in the ED until their evaluation is complete.  Note: Portions of this report may have been transcribed using voice recognition software. Every effort was made to ensure accuracy; however, inadvertent computerized transcription errors may be present    [name] 08/29/21 2025    Drenda Freeze, MD 08/29/21 [phone]

## 2021-08-30 ENCOUNTER — Telehealth: Payer: Self-pay | Admitting: Family Medicine

## 2021-08-30 NOTE — Telephone Encounter (Signed)
[place] with Wyvonna Plum to ER for wound on left leg next to   Chronic Bilateral lower extreme swelling  Do you want nurse to go out to do wound care or an evaluation?  Please call

## 2021-09-02 NOTE — Telephone Encounter (Signed)
Pt. Showed up on my pt. Ping report for leg wound lower extremity edema. There is a previous telephone message about home health orders. Not sure if you needed to see the pt. Here.

## 2021-09-03 ENCOUNTER — Other Ambulatory Visit: Payer: Self-pay

## 2021-09-03 NOTE — Telephone Encounter (Signed)
Pt will need to come in to be able to answer referral question for home health. Will make pt appointment if I can. [place]

## 2021-09-04 DIAGNOSIS — G894 Chronic pain syndrome: Secondary | ICD-10-CM | POA: Diagnosis not present

## 2021-09-04 DIAGNOSIS — M170 Bilateral primary osteoarthritis of knee: Secondary | ICD-10-CM | POA: Diagnosis not present

## 2021-09-04 DIAGNOSIS — I10 Essential (primary) hypertension: Secondary | ICD-10-CM | POA: Diagnosis not present

## 2021-09-04 DIAGNOSIS — M479 Spondylosis, unspecified: Secondary | ICD-10-CM | POA: Diagnosis not present

## 2021-09-04 DIAGNOSIS — J439 Emphysema, unspecified: Secondary | ICD-10-CM | POA: Diagnosis not present

## 2021-09-04 DIAGNOSIS — S76191D Other specified injury of right quadriceps muscle, fascia and tendon, subsequent encounter: Secondary | ICD-10-CM | POA: Diagnosis not present

## 2021-09-04 DIAGNOSIS — S8982XS Other specified injuries of left lower leg, sequela: Secondary | ICD-10-CM | POA: Diagnosis not present

## 2021-09-04 DIAGNOSIS — R600 Localized edema: Secondary | ICD-10-CM | POA: Diagnosis not present

## 2021-09-04 DIAGNOSIS — L03116 Cellulitis of left lower limb: Secondary | ICD-10-CM | POA: Diagnosis not present

## 2021-09-09 DIAGNOSIS — M66251 Spontaneous rupture of extensor tendons, right thigh: Secondary | ICD-10-CM | POA: Diagnosis not present

## 2021-09-10 ENCOUNTER — Ambulatory Visit (INDEPENDENT_AMBULATORY_CARE_PROVIDER_SITE_OTHER): Payer: Medicare PPO | Admitting: Family Medicine

## 2021-09-10 ENCOUNTER — Other Ambulatory Visit: Payer: Self-pay

## 2021-09-10 ENCOUNTER — Encounter: Payer: Self-pay | Admitting: Family Medicine

## 2021-09-10 VITALS — BP 130/82 | HR 64 | Temp 99.4°F | Wt 258.0 lb

## 2021-09-10 DIAGNOSIS — L03116 Cellulitis of left lower limb: Secondary | ICD-10-CM

## 2021-09-10 DIAGNOSIS — S76111S Strain of right quadriceps muscle, fascia and tendon, sequela: Secondary | ICD-10-CM

## 2021-09-10 DIAGNOSIS — R609 Edema, unspecified: Secondary | ICD-10-CM | POA: Diagnosis not present

## 2021-09-10 MED ORDER — FUROSEMIDE 20 MG PO TABS: 20.0000 mg | ORAL_TABLET | Freq: Every day | ORAL | 3 refills | Status: DC

## 2021-09-10 MED ORDER — DOXYCYCLINE HYCLATE 100 MG PO CAPS
100.0000 mg | ORAL_CAPSULE | Freq: Two times a day (BID) | ORAL | 0 refills | Status: DC
Start: 2021-09-10 — End: 2021-12-20

## 2021-09-10 NOTE — Progress Notes (Signed)
   Subjective:    Patient ID: Kristopher Gay, male    DOB: 27-Jun-1951, 70 y.o.   MRN: [id number]  HPI He is here for a follow-up visit after recent emergency room visit and subsequent treatment for left lower extremity cellulitis.  He was seen on November 3.  The ER record including blood work was evaluated.  He was sent home on doxycycline.  He completed a 10-day course 2 days ago but is still having some difficulty with pain slight swelling.  He does state that is roughly 70% better.  He would also like a refill on his Lasix to help with the edema.  He has been on it for quite some time. He has had previous right quad tendon repair and is still wearing a brace and doing rehab.  Review of Systems     Objective:   Physical Exam Alert and in no distress.  Exam of the left lower extremity does show slight tenderness palpation and slight warmth to the medial aspect of the ankle.  The area is reddish-brown in color.  No drainage is noted.       Assessment & Plan:  Cellulitis of left lower extremity - Plan: doxycycline (VIBRAMYCIN) 100 MG capsule  Dependent edema - Plan: furosemide (LASIX) 20 MG tablet Instructed him to call me at the end of the 10-day course if there is any question and I will certainly renew his medication if needed. Encouraged him to continue with strengthening exercises for his right knee.  He has had previous tendon repair and is wearing a brace.

## 2021-09-11 DIAGNOSIS — M66251 Spontaneous rupture of extensor tendons, right thigh: Secondary | ICD-10-CM | POA: Diagnosis not present

## 2021-09-12 DIAGNOSIS — S76191D Other specified injury of right quadriceps muscle, fascia and tendon, subsequent encounter: Secondary | ICD-10-CM | POA: Diagnosis not present

## 2021-09-12 DIAGNOSIS — R600 Localized edema: Secondary | ICD-10-CM | POA: Diagnosis not present

## 2021-09-12 DIAGNOSIS — M170 Bilateral primary osteoarthritis of knee: Secondary | ICD-10-CM | POA: Diagnosis not present

## 2021-09-12 DIAGNOSIS — M479 Spondylosis, unspecified: Secondary | ICD-10-CM | POA: Diagnosis not present

## 2021-09-12 DIAGNOSIS — S8982XS Other specified injuries of left lower leg, sequela: Secondary | ICD-10-CM | POA: Diagnosis not present

## 2021-09-12 DIAGNOSIS — L03116 Cellulitis of left lower limb: Secondary | ICD-10-CM | POA: Diagnosis not present

## 2021-09-12 DIAGNOSIS — I10 Essential (primary) hypertension: Secondary | ICD-10-CM | POA: Diagnosis not present

## 2021-09-12 DIAGNOSIS — J439 Emphysema, unspecified: Secondary | ICD-10-CM | POA: Diagnosis not present

## 2021-09-12 DIAGNOSIS — G894 Chronic pain syndrome: Secondary | ICD-10-CM | POA: Diagnosis not present

## 2021-09-25 DIAGNOSIS — M66251 Spontaneous rupture of extensor tendons, right thigh: Secondary | ICD-10-CM | POA: Diagnosis not present

## 2021-09-28 ENCOUNTER — Other Ambulatory Visit: Payer: Self-pay | Admitting: Registered Nurse

## 2021-09-30 NOTE — Telephone Encounter (Signed)
Can you refill Tramadol for patient in [place] absence?

## 2021-11-08 ENCOUNTER — Other Ambulatory Visit: Payer: Self-pay | Admitting: Physical Medicine and Rehabilitation

## 2021-11-11 ENCOUNTER — Encounter: Payer: Self-pay | Admitting: *Deleted

## 2021-11-12 ENCOUNTER — Telehealth: Payer: Self-pay | Admitting: *Deleted

## 2021-11-12 MED ORDER — TRAMADOL HCL 50 MG PO TABS: ORAL_TABLET | ORAL | 2 refills | Status: DC

## 2021-11-12 NOTE — Telephone Encounter (Signed)
PMP was Reviewed Tramadol e-scribed today.  Sybil RN sent Mr. Hires a My-Chart message regarding the above.

## 2021-11-27 NOTE — Telephone Encounter (Signed)
error 

## 2021-11-28 ENCOUNTER — Other Ambulatory Visit: Payer: Self-pay | Admitting: Adult Health

## 2021-11-29 NOTE — Telephone Encounter (Signed)
appt needed

## 2021-12-03 ENCOUNTER — Ambulatory Visit (HOSPITAL_BASED_OUTPATIENT_CLINIC_OR_DEPARTMENT_OTHER): Payer: Medicare PPO | Admitting: Internal Medicine

## 2021-12-03 ENCOUNTER — Encounter (HOSPITAL_BASED_OUTPATIENT_CLINIC_OR_DEPARTMENT_OTHER): Payer: Self-pay | Admitting: Internal Medicine

## 2021-12-03 ENCOUNTER — Other Ambulatory Visit: Payer: Self-pay

## 2021-12-03 VITALS — BP 138/72 | HR 59 | Ht 74.0 in | Wt 250.8 lb

## 2021-12-03 DIAGNOSIS — I480 Paroxysmal atrial fibrillation: Secondary | ICD-10-CM

## 2021-12-03 DIAGNOSIS — I2510 Atherosclerotic heart disease of native coronary artery without angina pectoris: Secondary | ICD-10-CM | POA: Diagnosis not present

## 2021-12-03 DIAGNOSIS — I10 Essential (primary) hypertension: Secondary | ICD-10-CM

## 2021-12-03 DIAGNOSIS — E785 Hyperlipidemia, unspecified: Secondary | ICD-10-CM

## 2021-12-03 NOTE — Patient Instructions (Signed)
Medication Instructions:  No Changes In Medications at this time.  *If you need a refill on your cardiac medications before your next appointment, please call your pharmacy*  Follow-Up: At [facility], you and your health needs are our priority.  As part of our continuing mission to provide you with exceptional heart care, we have created designated Provider Care Teams.  These Care Teams include your primary Cardiologist (physician) and Advanced Practice Providers (APPs -  Physician Assistants and Nurse Practitioners) who all work together to provide you with the care you need, when you need it.  Your next appointment:   1 year(s)  The format for your next appointment:   In Person  Provider:   K. Mali [name], MD

## 2021-12-03 NOTE — Progress Notes (Signed)
OFFICE NOTE  Chief Complaint:  Pre-operative clearance  Primary Care Physician: [name], MD  HPI:  Kristopher Gay is a 71 y.o. male with a past medial history significant for COPD, recent hospitalization for hernia repair and postoperative A. fib.  I saw him in the hospital recommended starting Eliquis for a CHADSVASC score of 2 and he was noted then to spontaneously converted to sinus.  He said no recurrent A. fib that he is aware of.  Subsequently was diagnosed with an ulnar neuropathy by Dr. Apolonio Schneiders and underwent surgery.  He says that this did not go well and required a repeat surgery.  He has been undergoing rehabilitation and now is complaining of right shoulder pain.  His Eliquis was permanently discontinued as it was felt that he was low enough risk to not require this by [name], PA-C in discussion with Dr. Sallyanne Kuster, who may have been the doctor of the day when he was seen in the office.  We again addressed his CHADSVASC score.  I believe it is elevated due to his age over 6, however he denies a history of hypertension.  He is only on metoprolol for rate control.  There is questionable history of peripheral vascular disease which would have been another point, otherwise he has a low CHADSVASC score of 1.  01/21/2021  Kristopher Gay is seen today in follow-up.  Unfortunately had recent knee injury last fall and underwent surgery.  There was some complications with that.  He subsequently sought out a different orthopedist Dr. Percell Miller.  Based on that he is going to need repeat surgery.  He is here today for cardiovascular clearance.  He did undergo cardiac work-up in 2020 including a CT coronary angiogram which showed very minimal coronary artery disease in the LAD with a calcium score of 8, 22nd percentile for age and sex matched control.  Overall low risk study.  He denies chest pain or shortness of breath.  12/04/2021  Kristopher Gay returns today for follow-up.  He was last seen by Almyra Deforest, PA-C.  Blood pressure has been monitored by home nursing and noted to be elevated however office readings have been normal.  When he was last seen his blood pressure was checked at 400 systolic.  Blood pressure today was 138/72.  He is on metoprolol but that is for rate control for A-fib.  He has not had any recurrent A-fib and in fact is not on anticoagulation.  This was noted only postoperatively remotely more than 5 years ago.  EKG today shows sinus bradycardia.  His CHA2DS2-VASc score is 1 for age.  PMHx:  Past Medical History:  Diagnosis Date   A-fib ([place])    Allergic rhinitis, cause unspecified    Allergy    Anxiety    Blood clot in vein 2002   left calf SUPERFICIAL CLOT REMOVED THEN PART OF VEIN REMOVED   Chronic headaches    COPD (chronic obstructive pulmonary disease) (HCC)    Cough    X 1 YEAR NO FEVER CLEAR SPUTUM   DDD (degenerative disc disease)    Depression    DJD (degenerative joint disease)    ALL THE WAY DOWN SPINE   Dropfoot    LEFT , NO BRACE WORN NOW   Dysrhythmia    a fib one time after hernia surgery   Emphysema of lung (HCC)    Extrinsic asthma, unspecified    GERD (gastroesophageal reflux disease)    OCC NO MEDS  FOR   Hypertension    STOPPED HTN MEDS UNTIL 2011, THEN HAD WEIGHT LOSS, NO MEDS SINCE   Neuromuscular disorder (HCC)    neuropathy  legs and feet   Neuropathy    POLYNEUOPATHOPATHY FEET AND LEGS   Peripheral vascular disease (HCC)    VARICOSE VEINS LEFT LEG   Pneumonia AS CHILD   PONV (postoperative nausea and vomiting)    Syncope    Ulnar nerve entrapment at elbow, right    Umbilical hernia    Unspecified asthma(493.90)     Past Surgical History:  Procedure Laterality Date   COLONSCOPY  06/16/2017   HERNIA REPAIR     INGUINAL HERNIA REPAIR Bilateral 07/20/2017   Procedure: LAPAROSCOPIC BILATERAL INGUINAL HERNIA REPAIR WITH MESH, UMBILICAL HERNIA REPAIR AND LYSIS OF ADHESIONS;  Surgeon: [name], [name], MD;  Location: [facility];  Service: General;  Laterality: Bilateral;   LAPAROSCOPY N/A 07/20/2017   Procedure: LAPAROSCOPY DIAGNOSTIC;  Surgeon: Mickeal Skinner, MD;  Location: [facility];  Service: General;  Laterality: N/A;   QUADRICEPS TENDON REPAIR Right 08/07/2020   Procedure: REPAIR QUADRICEP TENDON;  Surgeon: Renette Butters, MD;  Location: [facility];  Service: Orthopedics;  Laterality: Right;  NEED RAPID TEST;SAME DAY WORKUP; COMING FROM [facility]   QUADRICEPS TENDON REPAIR Right 02/26/2021   Procedure: REPAIR QUADRICEP TENDON;  Surgeon: Renette Butters, MD;  Location: [facility];  Service: Orthopedics;  Laterality: Right;   ROTATOR CUFF REPAIR Left 2005   detached; left shoulder-reattached   SHOULDER SURGERY Right    laBRAL  tendon torn   SUPERFICIAL LEFT LEG VEIN STRIPPING WITH SMALL SUPERFICIAL CLOT  2002   ULNAR NERVE TRANSPOSITION Right 05/10/2018   Procedure: RIGHT ELBOW ULNAR NERVE RELEASE;  Surgeon: Iran Planas, MD;  Location: [facility];  Service: Orthopedics;  Laterality: Right;   UMBILICAL HERNIA REPAIR  19/37/9024   UMBILICAL HERNIA REPAIR N/A 02/18/2018   Procedure: LAPAROSCOPIC UMBILICAL HERNIA REPAIR ERAS PATHWAY;  Surgeon: [name], [name], MD;  Location: [facility];  Service: General;  Laterality: N/A;    FAMHx:  Family History  Problem Relation Age of Onset   Pancreatic cancer Mother    Breast cancer Sister    Epilepsy Brother    Adrenal disorder Neg Hx     SOCHx:   reports that he has never smoked. He has never used smokeless tobacco. He reports that he does not drink alcohol and does not use drugs.  ALLERGIES:  Allergies  Allergen Reactions   Bee Venom Swelling   Linzess [Linaclotide] Other (See Comments)    Abdominal pain   Molds & Smuts Other (See Comments)    Unknown   Seasonal Ic [Cholestatin] Other (See Comments)    Environmental Allergies    ROS: Pertinent items noted in HPI and remainder of comprehensive ROS otherwise negative.  HOME MEDS: Current  Outpatient Medications on File Prior to Visit  Medication Sig Dispense Refill   acetaminophen (TYLENOL) 500 MG tablet Take 2 tablets (1,000 mg total) by mouth every 6 (six) hours as needed for mild pain or moderate pain. 60 tablet 0   ascorbic acid (VITAMIN C) 1000 MG tablet Take 2,000 mg by mouth daily.     atorvastatin (LIPITOR) 40 MG tablet TAKE 1 TABLET(40 MG) BY MOUTH DAILY 90 tablet 2   Azelastine-Fluticasone 137-50 MCG/ACT SUSP Place 1 spray into the nose as needed. (Patient taking differently: Place 1 spray into the nose in the morning and at bedtime.) 23 g  5   Calcium Carbonate-Vit D-Min (CALCIUM 1200 PO) Take 1 tablet by mouth in the morning and at bedtime.     celecoxib (CELEBREX) 100 MG capsule TAKE 1 CAPSULE BY MOUTH TWICE DAILY 180 capsule 1   Cholecalciferol (VITAMIN D3) 50 MCG (2000 UT) capsule Take 2,000 Units by mouth in the morning and at bedtime.     cyanocobalamin 1000 MCG tablet Take 1,000 mcg by mouth in the morning and at bedtime.     DHEA 25 MG CAPS Take 25 mg by mouth daily.     doxycycline (VIBRAMYCIN) 100 MG capsule Take 1 capsule (100 mg total) by mouth 2 (two) times daily. 20 capsule 0   EPINEPHrine 0.3 mg/0.3 mL IJ SOAJ injection Inject 0.3 mg into the muscle as needed for anaphylaxis.     Fluticasone-Umeclidin-Vilant (TRELEGY ELLIPTA) 100-62.5-25 MCG/INH AEPB INHALE 1 PUFF INTO THE LUNGS EVERY DAY (Patient taking differently: Inhale 1 puff into the lungs daily.) 60 each 12   furosemide (LASIX) 20 MG tablet Take 1 tablet (20 mg total) by mouth daily. 90 tablet 3   gabapentin (NEURONTIN) 300 MG capsule Take 2 capsule in the morning. Take 1 capsule Mid-day and 2 capsule at bedtime. (Patient taking differently: Take 300-600 mg by mouth See admin instructions. Take 600 mg by mouth in the morning 300 mg Mid-day and 600 mg at bedtime.) 150 capsule 5   Glucosamine HCl-MSM (GLUCOSAMINE-MSM PO) Take 1 tablet by mouth in the morning and at bedtime.     Magnesium 250 MG TABS Take  250 mg by mouth daily.     metoprolol succinate (TOPROL-XL) 25 MG 24 hr tablet Take 1 tablet (25 mg total) by mouth daily. 90 tablet 2   montelukast (SINGULAIR) 10 MG tablet Take 1 tablet (10 mg total) by mouth daily. 30 tablet 5   Multiple Vitamin (MULTIVITAMIN) tablet Take 1 tablet by mouth 2 (two) times daily.      nitroGLYCERIN (NITROSTAT) 0.4 MG SL tablet Place 0.4 mg under the tongue every 5 (five) minutes as needed for chest pain.     Omega-3 Fatty Acids (FISH OIL) 1200 MG CAPS Take 1,200 mg by mouth in the morning and at bedtime.     traMADol (ULTRAM) 50 MG tablet Take two Tablets every 6 hours as needed for pain. 240 tablet 2   vitamin E 180 MG (400 UNITS) capsule Take 400 Units by mouth in the morning and at bedtime.     No current facility-administered medications on file prior to visit.    LABS/IMAGING: No results found. However, due to the size of the patient record, not all encounters were searched. Please check Results Review for a complete set of results. No results found.  LIPID PANEL:    Component Value Date/Time   CHOL 133 01/28/2021 1510   TRIG 74 01/28/2021 1510   HDL 38 (L) 01/28/2021 1510   CHOLHDL 3.5 01/28/2021 1510   LDLCALC 80 01/28/2021 1510     WEIGHTS: Wt Readings from Last 3 Encounters:  12/03/21 250 lb 12.8 oz (113.8 kg)  09/10/21 258 lb (117 kg)  08/29/21 250 lb (113.4 kg)    VITALS: BP 138/72    Pulse (!) 59    Ht 6\' 2"  (1.88 m)    Wt 250 lb 12.8 oz (113.8 kg)    SpO2 97%    BMI 32.20 kg/m   EXAM: General appearance: alert and no distress Neck: no carotid bruit, no JVD and thyroid not enlarged, symmetric, no tenderness/mass/nodules  Lungs: clear to auscultation bilaterally Heart: regular rate and rhythm, S1, S2 normal, no murmur, click, rub or gallop Abdomen: soft, non-tender; bowel sounds normal; no masses,  no organomegaly and Obese Extremities: Right forearm is in a brace Pulses: 2+ and symmetric Skin: Skin color, texture, turgor  normal. No rashes or lesions Neurologic: Grossly normal Psych: Pleasant  EKG: Sinus bradycardia at 59-personally reviewed  ASSESSMENT: PAF-low CHADSVASC score of 1 (remote, postop, not anticoagulated) Minimal nonobstructive CAD with CAC score of 8, 22nd percent (2020) Periodic elevated blood pressure without diagnosis of hypertension  PLAN: 1.   Mr. Rinn seems to be doing well without recurrent atrial fibrillation.  He had some minimal nonobstructive coronary disease but is asymptomatic.  He occasionally gets blood pressure readings which are monitored is elevated but has no diagnosis of hypertension.  We will plan to continue current medications.  His lipids are well controlled with LDL of 80 back in April 2022 on atorvastatin.  Plan follow-up with me annually or sooner as necessary.  [name], MD, [facility], [place] Director of the Advanced Lipid Disorders &  Cardiovascular Risk Reduction Clinic Diplomate of the American Board of Clinical Lipidology Attending Cardiologist  Direct Dial: [phone]   Fax: [phone]  Website:  [email or web address] 12/03/2021, 1:52 PM

## 2021-12-04 ENCOUNTER — Telehealth: Payer: Self-pay | Admitting: Internal Medicine

## 2021-12-05 MED ORDER — FLUTICASONE-UMECLIDIN-VILANT 100-62.5-25 MCG/ACT IN AEPB: 1.0000 | INHALATION_SPRAY | Freq: Every day | RESPIRATORY_TRACT | 0 refills | Status: DC

## 2021-12-05 NOTE — Telephone Encounter (Signed)
Spoke with the pt  I refilled his trelegy  Nothing further needed

## 2021-12-05 NOTE — Telephone Encounter (Signed)
Attempted to call pt but unable to reach. Left message for him to return call. °

## 2021-12-07 IMAGING — CR DG KNEE COMPLETE 4+V*R*
4 series · 4 of 4 positions shown · non-contrast
Comparison: Prior radiograph from 08/17/2008.

CLINICAL DATA: Initial evaluation for acute pain status post fall.

EXAM:
RIGHT KNEE - COMPLETE 4+ VIEW

[x knee ap right]
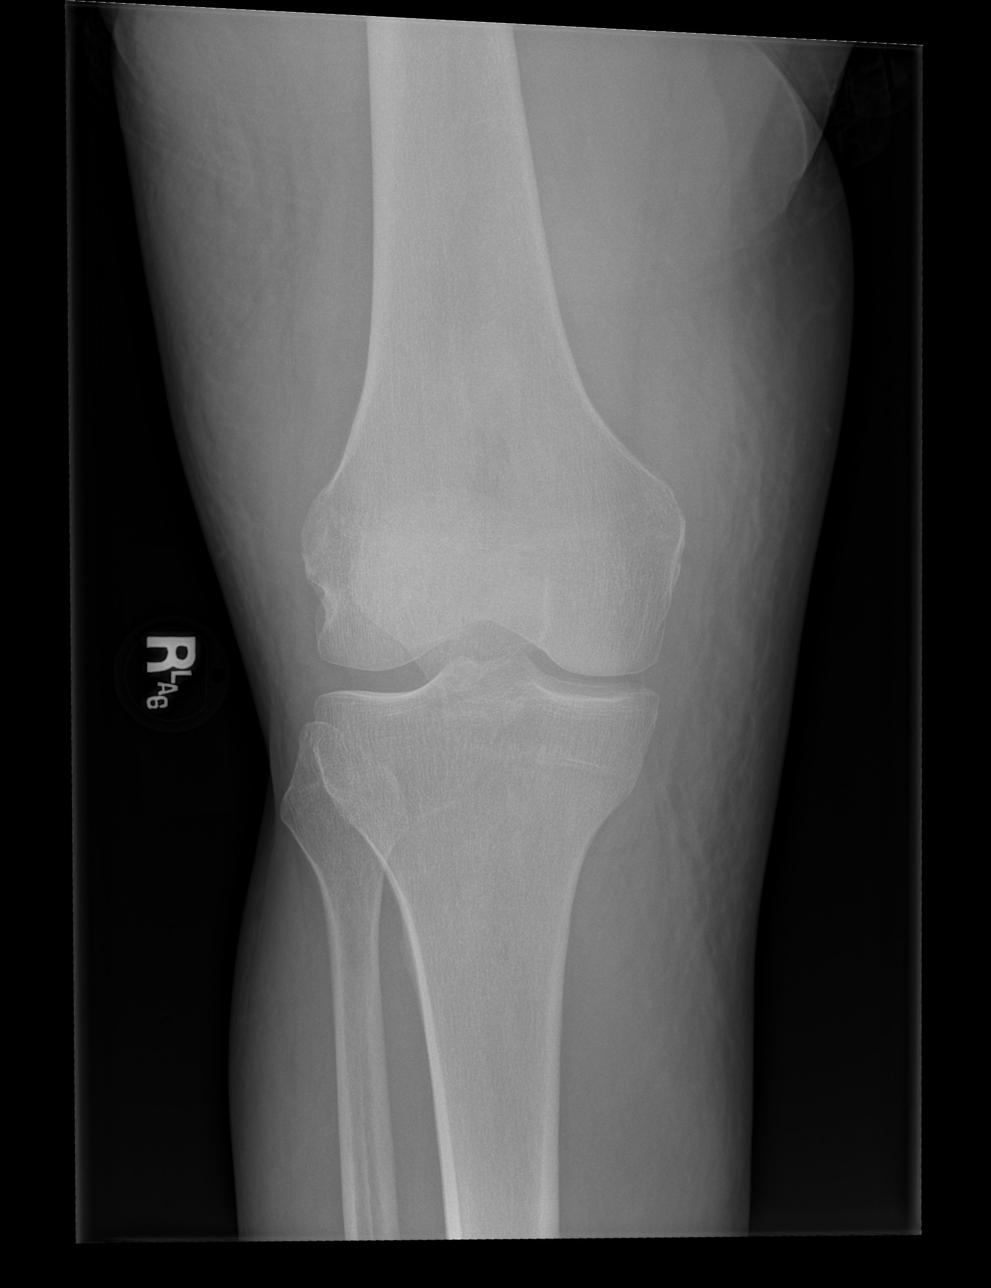

[x knee obl right (1 of 2)]
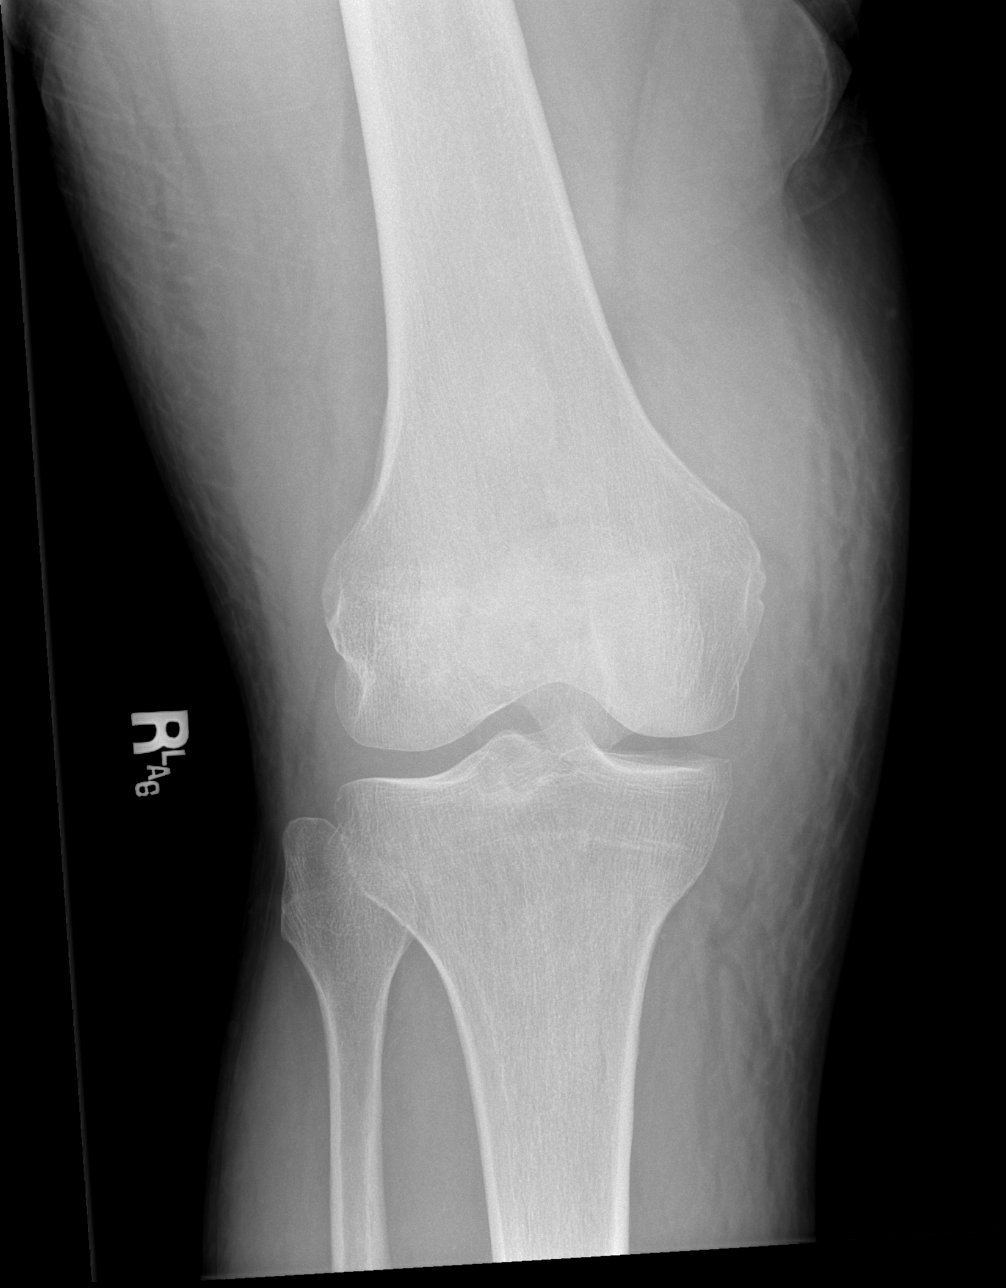

[x knee obl right (2 of 2)]
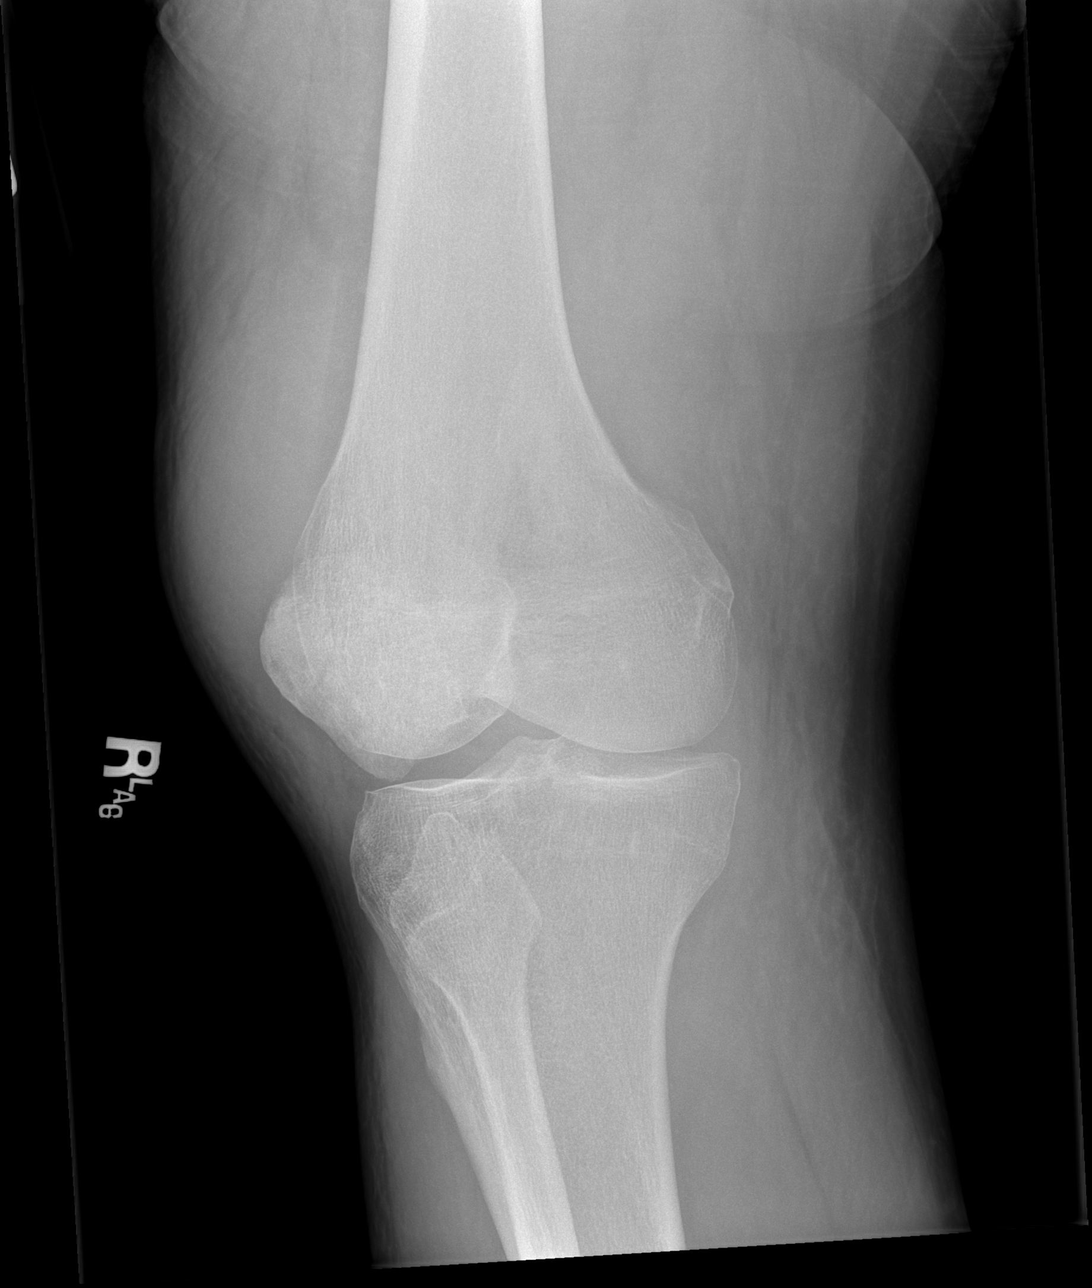

[x knee lat right]
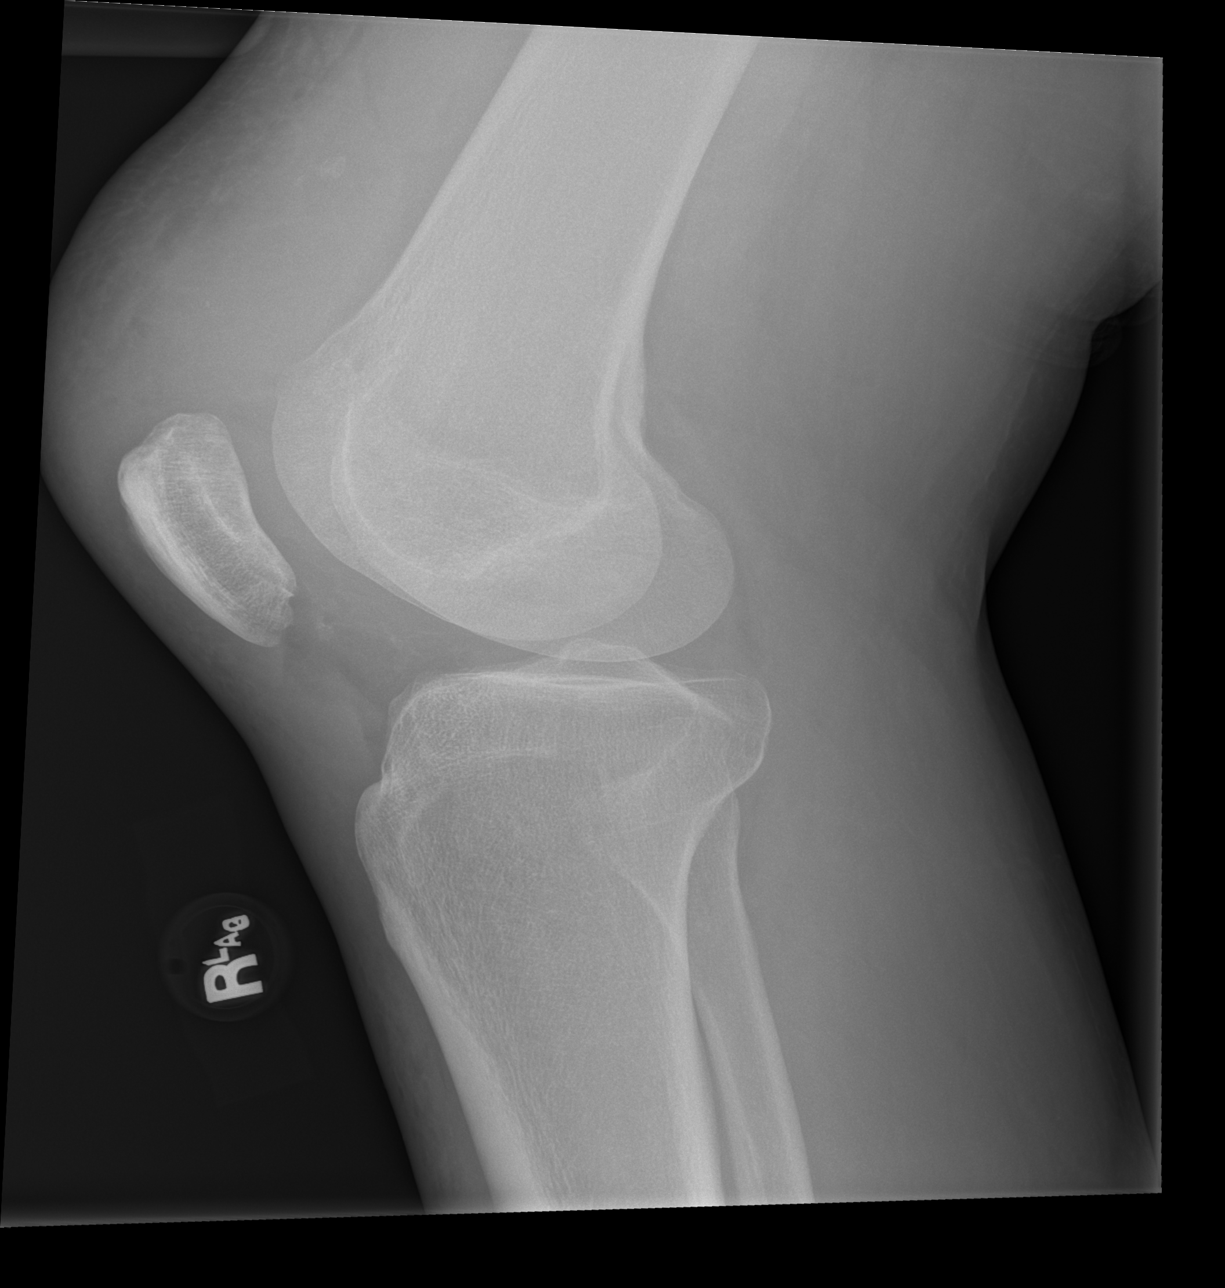

[4 of 4 positions shown; findings below may reference images not displayed]

FINDINGS: No acute fracture or dislocation. Prominent soft tissue swelling
seen at the anterior/superior aspect of the knee. Suspected
underlying joint effusion. Minimal osteoarthritic changes about the
knee. Mild osteopenia.
IMPRESSION: 1. No acute fracture or dislocation.
2. Prominent soft tissue swelling at the anterior/superior aspect of
the knee. Suspected underlying joint effusion.

## 2021-12-07 IMAGING — CR DG FOOT COMPLETE 3+V*R*
3 series · 3 of 3 positions shown · non-contrast
Comparison: None.

CLINICAL DATA: Post fall with right foot pain. Fall in yard earlier
today. Pain, bruising and swelling across the metatarsals.

EXAM:
RIGHT FOOT COMPLETE - 3+ VIEW

[x foot ap right]
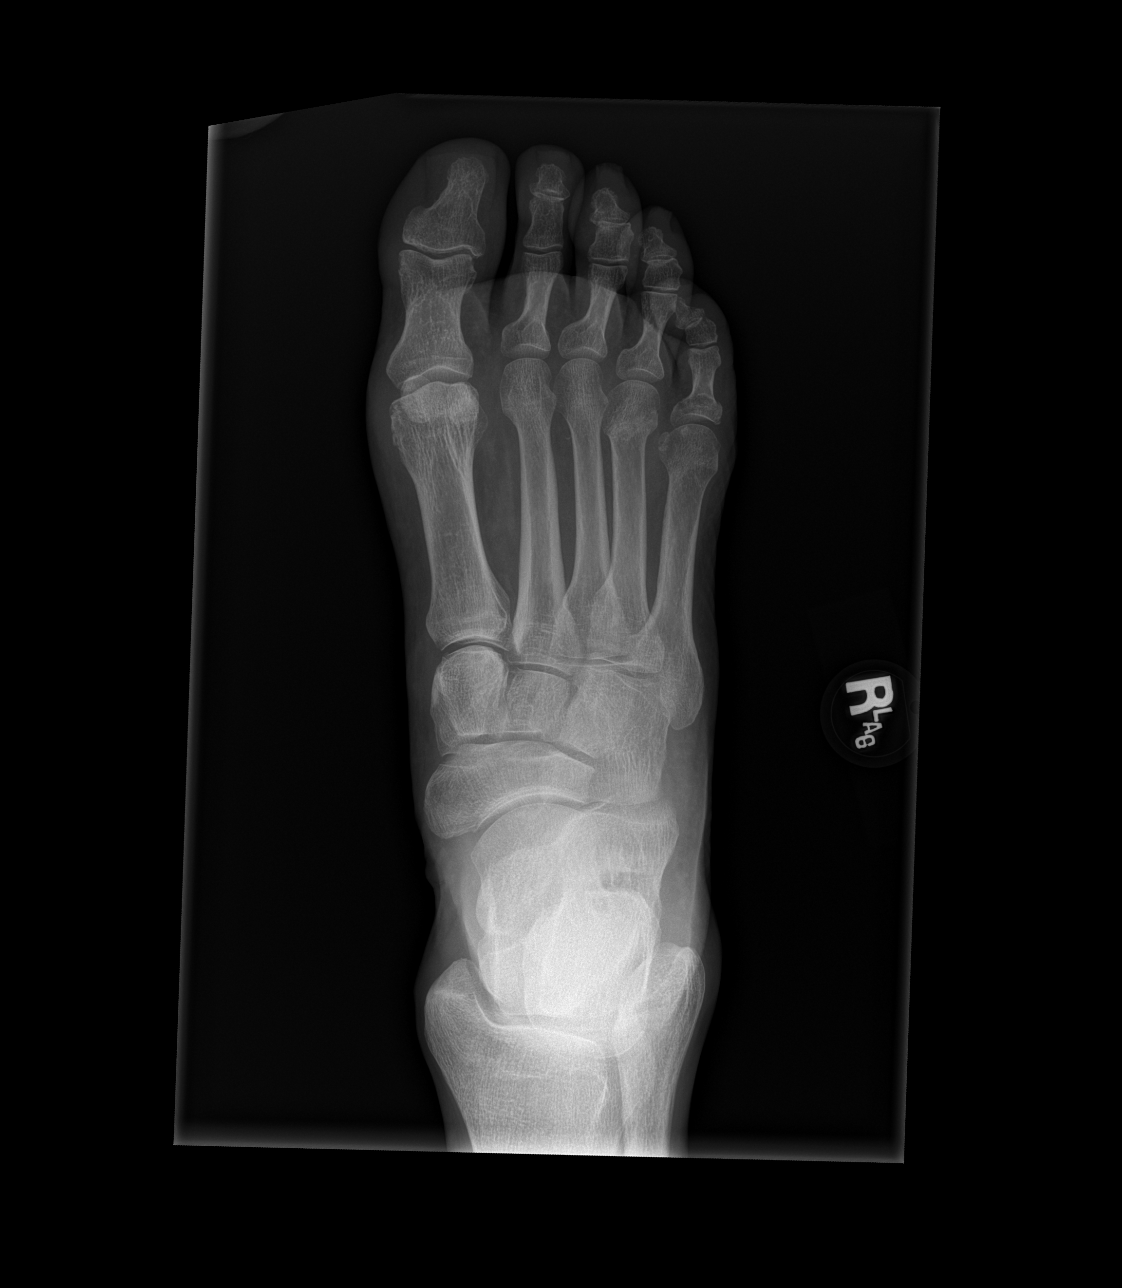

[x foot obl right]
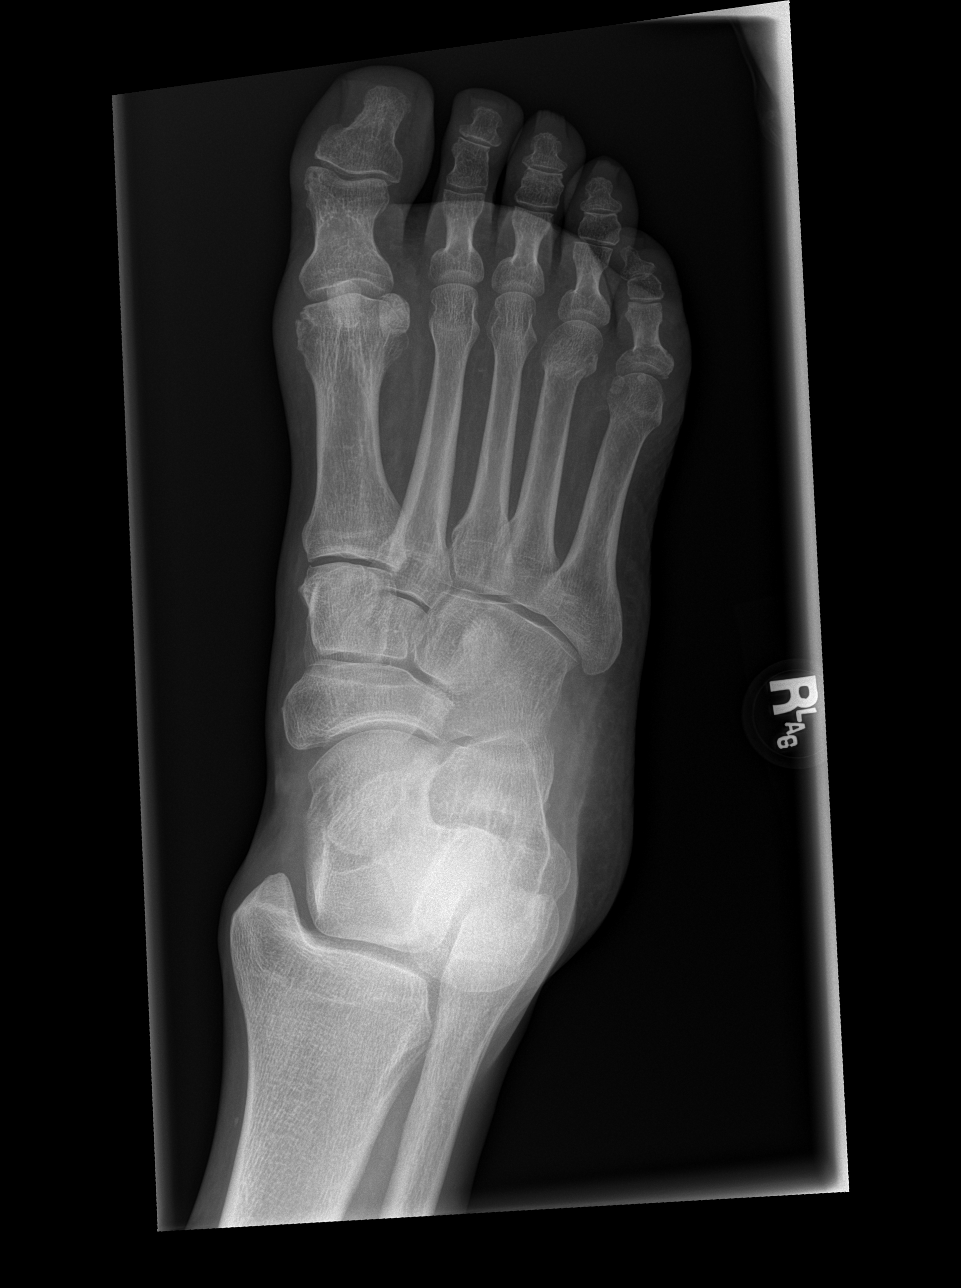

[x foot lat right]
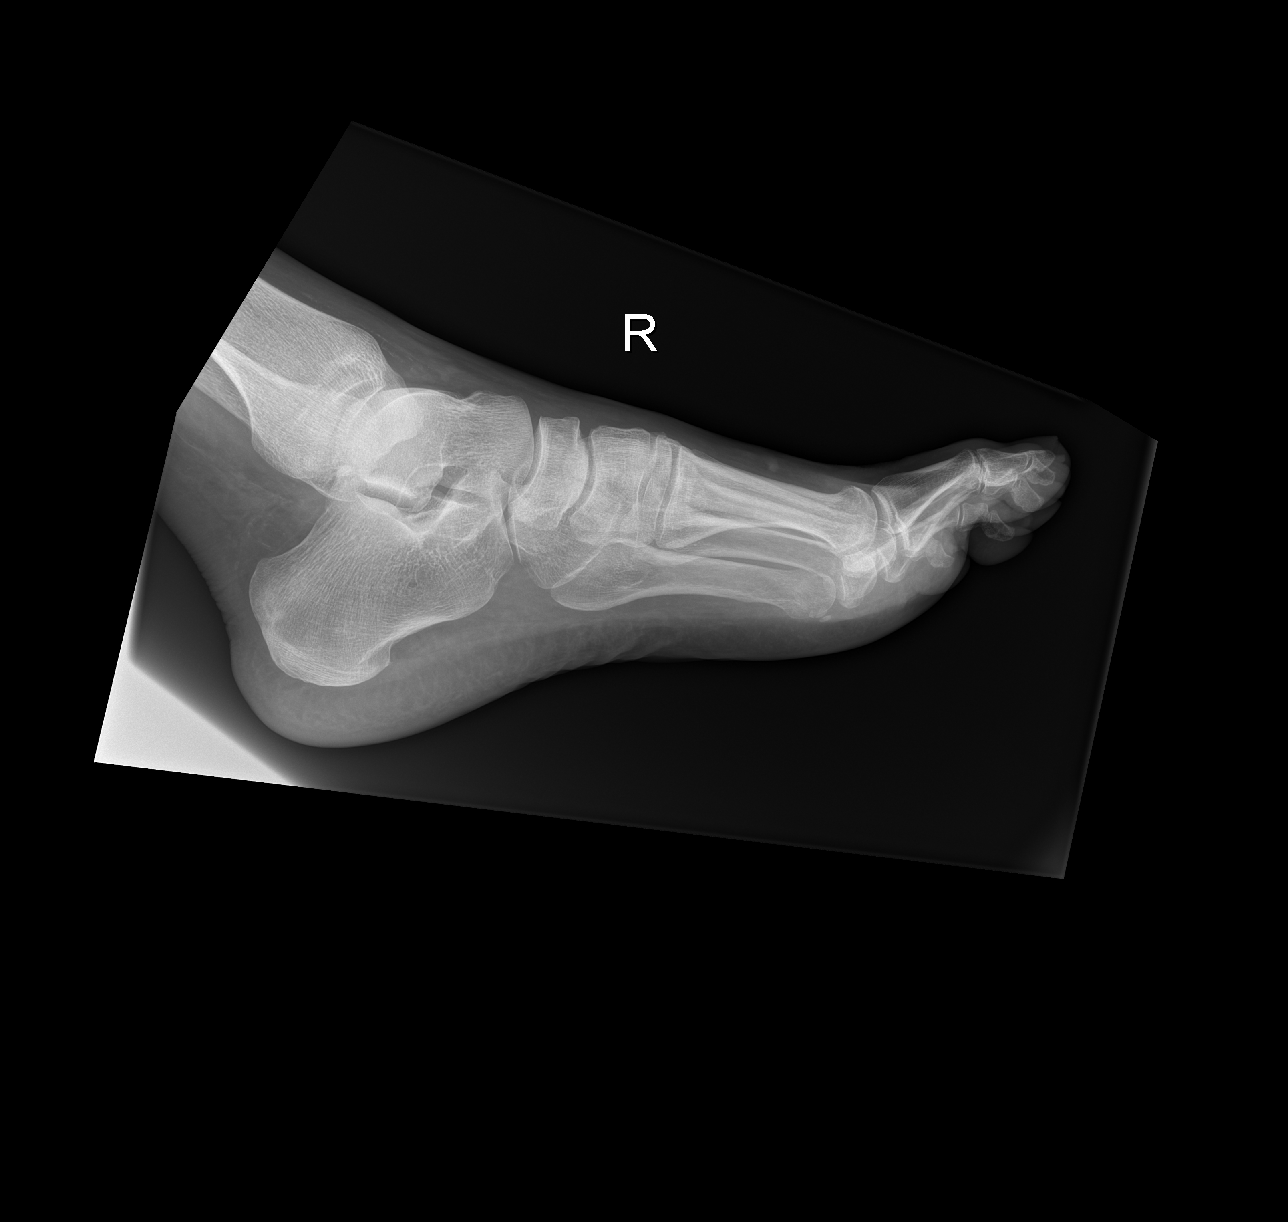

[3 of 3 positions shown; findings below may reference images not displayed]

FINDINGS: Probable nondisplaced transverse fifth toe proximal phalanx fracture
without intra-articular extension. There is a minimally displaced
fracture involving the fourth metatarsal neck. Overall normal
alignment. Normal Lisfranc alignment. No mid or hindfoot fracture.
Soft tissue edema noted about the forefoot.
IMPRESSION: 1. Minimally displaced fourth metatarsal neck fracture.
2. Probable nondisplaced transverse fifth toe proximal phalanx
fracture.

## 2021-12-18 NOTE — Progress Notes (Signed)
Tear---------------  Patient ID: Kristopher Gay, male    DOB: 1951/04/04, 71 y.o.   MRN: [id number]  HPI  M never smoker followed for allergic rhinitis and allergic asthma/ bronchitis, complicated by chronic musculoskeletal pain after an injury, GERD/ reflux, AFib Barium Swallow 04/04/14.2 Moderate gastroesophageal reflux to the level of mid esophagus Allergy Profile 10/16/16- Neg-no elevation for local environmental allergens CT chest 01/11/2016-IMPRESSION: 1. 2.2 cm left adrenal adenoma.ower lobe  2. New bronchial wall thickening and nodular opacities in the right likely reflecting bronchiolitis Office Spirometry 09/23/2016-mild restriction of exhaled volume. FVC 3.75/71%, FEV1 3.01/76%, ratio 0.80, FEF 25-75% 2.96/95%. -------------------------------------------------------------------------------------------------------------------   09/29/2019- Virtual Visit via Telephone Note  I connected with Kristopher Gay on 09/29/19 at  4:00 PM EST by telephone and verified that I am speaking with the correct person using two identifiers.  Location: Patient: H Provider: O   I discussed the limitations, risks, security and privacy concerns of performing an evaluation and management service by telephone and the availability of in person appointments. I also discussed with the patient that there may be a patient responsible charge related to this service. The patient expressed understanding and agreed to proceed.   History of Present Illness:  71 year old male never smoker followed for Allergic rhinitis, allergic asthma/bronchitis, complicated by chronic musculoskeletal pain after injury, GERD/reflux, grade 1 diastolic dysfunction, degenerative disc disease, AFib/ Eliquis  Trelegy, flonase, singulair, Dymista nasal spray, [facility]  Thinks Trelegy makes his throat sore, but says breathing/ asthma is ok now.  Pending more surgery- ulnar nerve repair. Chronic musculoskeletal  pain.   Observations/Objective:   Assessment and Plan: Asthmatic bronchitis- try using Trelegy before meals to see if that helps the sore throat.  Chronic musculoskeletal pain- managed by pain clinic. Does affect sleep.   Follow Up Instructions: 1 year  I discussed the assessment and treatment plan with the patient. The patient was provided an opportunity to ask questions and all were answered. The patient agreed with the plan and demonstrated an understanding of the instructions.   The patient was advised to call back or seek an in-person evaluation if the symptoms worsen or if the condition fails to improve as anticipated.  I provided 16 minutes of non-face-to-face time during this encounter.   Baird Lyons, MD  12/20/21- 71 year old male never smoker followed for Allergic rhinitis, allergic asthma/bronchitis, complicated by chronic musculoskeletal pain after injury, GERD/reflux, grade 1 diastolic dysfunction, degenerative disc disease, AFib/ Eliquis, Cellulitis Left leg, Leg Brace for hx Quadriceps tear, HTN,  - Trelegy, flonase, singulair, Dymista nasal spray, [facility].  Covid vax-3 Phizer Flu vax-had  Patient states he had to stop going to the asthma office while in SNF after leg surgery and then transportation was an issue. Feels like his breathing is doing good.   He has had repeated right knee surgery after a quadriceps tendon tear.  Gradually more mobile.  Some swelling in legs but wears elastic hose.  Breathing is currently comfortable but aware of dyspnea on exertion.  He had been immobilized a long time and we discussed deconditioning.  Little cough or wheeze.  Medications are good but needs refill Singulair.                            CXR 03/08/21- 1V- IMPRESSION: Nodular-like densities overlying the left lung of unclear etiology. Recommend CT chest for further evaluation.   ROS-see  HPI             + = positive Constitutional:   No-    weight loss, night sweats, fevers, chills, fatigue, lassitude. HEENT:   No-  headaches, difficulty swallowing, tooth/dental problems, +sore throat,       sneezing, itching, ear ache,  nasal congestion, +post nasal drip,  CV:  chest pain, no-orthopnea, PND, swelling in lower extremities, anasarca,  dizziness, palpitations Resp: No-   shortness of breath with exertion or at rest.             + productive cough,  + non-productive cough,  No- coughing up of blood.              No-   change in color of mucus.  +wheezing.   Skin: No-   rash or lesions. GI:  No-   heartburn, indigestion, abdominal pain, nausea, vomiting,  GU: . MS: +  joint pain or swelling.  No- decreased range of motion.  + back pain. Neuro-     nothing unusual Psych:  No- change in mood or affect. + depression or anxiety.  No memory loss.  OBJ- Physical Exam General- Alert, Oriented, Affect-appropriate, Distress- none acute. +tall Skin- rash-none, lesions- none, excoriation- none Lymphadenopathy- none Head- atraumatic            Eyes- Gross vision intact, PERRLA, conjunctivae and secretions clear            Ears- Hearing, canals-normal            Nose- Clear, no-Septal dev, mucus, polyps, erosion, perforation             Throat- Mallampati II , mucosa clear , drainage- none, tonsils- atrophic Neck- flexible , trachea midline, no stridor , thyroid nl, carotid no bruit Chest - symmetrical excursion , unlabored           Heart/CV- RRR , no murmur , no gallop  , no rub, nl s1 s2                           - JVD- none , edema- none, stasis changes- none, varices- none           Lung- clear, dullness-none, rub- none, cough-none           Chest wall- not obviously tender Abd-  Br/ Gen/ Rectal- Not done, not indicated Extrem- +elastic hose, +rolling walker   Neuro- grossly intact to observation

## 2021-12-20 ENCOUNTER — Other Ambulatory Visit: Payer: Self-pay

## 2021-12-20 ENCOUNTER — Encounter: Payer: Self-pay | Admitting: Internal Medicine

## 2021-12-20 ENCOUNTER — Ambulatory Visit: Payer: Medicare PPO | Admitting: Internal Medicine

## 2021-12-20 ENCOUNTER — Ambulatory Visit (INDEPENDENT_AMBULATORY_CARE_PROVIDER_SITE_OTHER): Payer: Medicare PPO

## 2021-12-20 VITALS — BP 120/72 | HR 61 | Temp 98.1°F | Ht 74.0 in | Wt 243.0 lb

## 2021-12-20 DIAGNOSIS — J3089 Other allergic rhinitis: Secondary | ICD-10-CM | POA: Diagnosis not present

## 2021-12-20 DIAGNOSIS — R911 Solitary pulmonary nodule: Secondary | ICD-10-CM | POA: Diagnosis not present

## 2021-12-20 DIAGNOSIS — J302 Other seasonal allergic rhinitis: Secondary | ICD-10-CM | POA: Diagnosis not present

## 2021-12-20 DIAGNOSIS — J4520 Mild intermittent asthma, uncomplicated: Secondary | ICD-10-CM | POA: Diagnosis not present

## 2021-12-20 MED ORDER — MONTELUKAST SODIUM 10 MG PO TABS: 10.0000 mg | ORAL_TABLET | Freq: Every day | ORAL | 12 refills | Status: DC

## 2021-12-20 NOTE — Patient Instructions (Addendum)
Singulair refilled  Order- CXR   dx left lung nodules  Please call if we can help

## 2021-12-20 NOTE — Assessment & Plan Note (Signed)
Uses flonase or Dymista when needed

## 2021-12-20 NOTE — Assessment & Plan Note (Signed)
Some DOE more related to deconditioning with limited mobility. Feels Singulair helps- needs refill Plan- refill Singulair, continue Trelegy

## 2021-12-26 ENCOUNTER — Other Ambulatory Visit: Payer: Self-pay | Admitting: Registered Nurse

## 2022-01-09 ENCOUNTER — Other Ambulatory Visit: Payer: Self-pay | Admitting: Internal Medicine

## 2022-01-25 ENCOUNTER — Other Ambulatory Visit: Payer: Self-pay | Admitting: Registered Nurse

## 2022-01-28 ENCOUNTER — Telehealth: Payer: Self-pay | Admitting: Registered Nurse

## 2022-01-28 MED ORDER — GABAPENTIN 300 MG PO CAPS: ORAL_CAPSULE | ORAL | 5 refills | Status: DC

## 2022-01-28 NOTE — Telephone Encounter (Signed)
Placed a call to Mr. Folson regarding his gabapentin, no answer. Left message to return the call.  ?

## 2022-01-28 NOTE — Telephone Encounter (Signed)
Patient returned your call. Please call him back.

## 2022-01-28 NOTE — Telephone Encounter (Signed)
Returned Kristopher Gay called, no answer.  ?Message left for return call.  ?Placed a call to [place] ?Gabapentin refilled today.  ?

## 2022-01-29 NOTE — Telephone Encounter (Signed)
Mr Ohair returned your call. ?

## 2022-01-29 NOTE — Telephone Encounter (Signed)
Returned Mr. Trahan call, no answer. Left message .  ?

## 2022-02-04 ENCOUNTER — Encounter: Payer: Medicare PPO | Attending: Registered Nurse | Admitting: Registered Nurse

## 2022-02-04 ENCOUNTER — Encounter: Payer: Self-pay | Admitting: Registered Nurse

## 2022-02-04 VITALS — BP 132/76 | HR 64 | Ht 74.0 in | Wt 237.0 lb

## 2022-02-04 DIAGNOSIS — M25512 Pain in left shoulder: Secondary | ICD-10-CM | POA: Diagnosis not present

## 2022-02-04 DIAGNOSIS — M25562 Pain in left knee: Secondary | ICD-10-CM | POA: Diagnosis present

## 2022-02-04 DIAGNOSIS — M25511 Pain in right shoulder: Secondary | ICD-10-CM | POA: Diagnosis not present

## 2022-02-04 DIAGNOSIS — M546 Pain in thoracic spine: Secondary | ICD-10-CM | POA: Insufficient documentation

## 2022-02-04 DIAGNOSIS — M25561 Pain in right knee: Secondary | ICD-10-CM | POA: Diagnosis present

## 2022-02-04 DIAGNOSIS — M542 Cervicalgia: Secondary | ICD-10-CM

## 2022-02-04 DIAGNOSIS — S8410XS Injury of peroneal nerve at lower leg level, unspecified leg, sequela: Secondary | ICD-10-CM | POA: Diagnosis not present

## 2022-02-04 DIAGNOSIS — G8929 Other chronic pain: Secondary | ICD-10-CM | POA: Diagnosis not present

## 2022-02-04 DIAGNOSIS — Z79891 Long term (current) use of opiate analgesic: Secondary | ICD-10-CM | POA: Diagnosis not present

## 2022-02-04 DIAGNOSIS — M47817 Spondylosis without myelopathy or radiculopathy, lumbosacral region: Secondary | ICD-10-CM | POA: Diagnosis not present

## 2022-02-04 DIAGNOSIS — G894 Chronic pain syndrome: Secondary | ICD-10-CM

## 2022-02-04 DIAGNOSIS — Z5181 Encounter for therapeutic drug level monitoring: Secondary | ICD-10-CM

## 2022-02-04 MED ORDER — TRAMADOL HCL 50 MG PO TABS: ORAL_TABLET | ORAL | 2 refills | Status: DC
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
## 2022-02-10 ENCOUNTER — Ambulatory Visit: Payer: Medicare PPO | Admitting: Family Medicine

## 2022-02-10 ENCOUNTER — Encounter: Payer: Self-pay | Admitting: Family Medicine

## 2022-02-10 VITALS — BP 110/66 | HR 55 | Temp 98.6°F | Ht 74.0 in | Wt 238.8 lb

## 2022-02-10 DIAGNOSIS — Z13220 Encounter for screening for lipoid disorders: Secondary | ICD-10-CM | POA: Diagnosis not present

## 2022-02-10 DIAGNOSIS — J3089 Other allergic rhinitis: Secondary | ICD-10-CM

## 2022-02-10 DIAGNOSIS — R1314 Dysphagia, pharyngoesophageal phase: Secondary | ICD-10-CM | POA: Diagnosis not present

## 2022-02-10 DIAGNOSIS — R609 Edema, unspecified: Secondary | ICD-10-CM

## 2022-02-10 DIAGNOSIS — G894 Chronic pain syndrome: Secondary | ICD-10-CM | POA: Diagnosis not present

## 2022-02-10 DIAGNOSIS — Z0000 Encounter for general adult medical examination without abnormal findings: Secondary | ICD-10-CM

## 2022-02-10 DIAGNOSIS — J302 Other seasonal allergic rhinitis: Secondary | ICD-10-CM

## 2022-02-10 DIAGNOSIS — J449 Chronic obstructive pulmonary disease, unspecified: Secondary | ICD-10-CM | POA: Diagnosis not present

## 2022-02-10 DIAGNOSIS — M549 Dorsalgia, unspecified: Secondary | ICD-10-CM | POA: Diagnosis not present

## 2022-02-10 DIAGNOSIS — K219 Gastro-esophageal reflux disease without esophagitis: Secondary | ICD-10-CM

## 2022-02-10 DIAGNOSIS — I480 Paroxysmal atrial fibrillation: Secondary | ICD-10-CM | POA: Diagnosis not present

## 2022-02-10 DIAGNOSIS — S76111D Strain of right quadriceps muscle, fascia and tendon, subsequent encounter: Secondary | ICD-10-CM

## 2022-02-10 DIAGNOSIS — G8929 Other chronic pain: Secondary | ICD-10-CM

## 2022-02-10 NOTE — Progress Notes (Signed)
Kristopher Gay is a 71 y.o. male who presents for annual wellness visit and follow-up on chronic medical conditions.  He has been in a rehab unit following 2 surgeries for quad tendon repair and then subsequent revision.  He seems to be doing fairly well at home and is doing home exercises.  He has seen cardiology in the past for history of atrial for but has not had any difficulty in quite some time.  He is followed by Dr. Annamaria Boots and is presently on Trelegy, Singulair and Dymista.  In the past he did get allergy shots but since he has been in the rehab unit he had not gotten any shots and states that his allergies are no worse off than they were prior to that.  He did get shots for several years.  Still has difficulty with dependent edema and does use Lasix for that.  He does occasionally have reflux symptoms.  He continues have difficulty with chronic aches and pains but at this time did not really mention any of them. ? ? ?Immunizations and Health Maintenance ?Immunization History  ?Administered Date(s) Administered  ? Fluad Quad(high Dose 65+) 08/08/2020  ? H1N1 10/06/2008  ? Influenza Split 08/08/2011, 09/24/2012, 08/27/2016  ? Influenza Whole 07/30/2009, 08/16/2010  ? Influenza, High Dose Seasonal PF 09/05/2016, 09/03/2018  ? Influenza,inj,Quad PF,6+ Mos 08/05/2013, 07/31/2014, 07/27/2015, 08/18/2017, 07/12/2019  ? Influenza-Unspecified 09/06/2021  ? PFIZER(Purple Top)SARS-COV-2 Vaccination 12/25/2019, 01/23/2020  ? Pension scheme manager 32yr & up 09/06/2021  ? Pneumococcal Conjugate-13 09/19/2019  ? Pneumococcal Polysaccharide-23 11/20/2020  ? Tdap 09/20/2019  ? Zoster Recombinat (Shingrix) 09/20/2019  ? ?Health Maintenance Due  ?Topic Date Due  ? Zoster Vaccines- Shingrix (2 of 2) 11/15/2019  ? COVID-19 Vaccine (4 - Booster for Pfizer series) 11/01/2021  ? ? ?Last colonoscopy: 05/28/2017 Dr. HBenson [place] ?Last PSA: N/A  ?Dentist: over twos  ?Ophtho: Q year ?Exercise: qd at the gym ? ?Other doctors  caring for patient include: Dr. HBenson NorwayGI ?           Dr. HBuren Koscardiologist  ?                  Dr. YAnnamaria Bootspulmonology  ? ?Advanced Directives: ?Does Patient Have a Medical Advance Directive?: No ?Would patient like information on creating a medical advance directive?: No - Patient declined ? ?Depression screen:  See questionnaire below.   ?  ? ?  02/10/2022  ?  1:49 PM 02/04/2022  ?  3:21 PM 01/25/2021  ?  1:05 PM 03/16/2020  ?  3:10 PM 11/15/2019  ? 10:56 AM  ?Depression screen PHQ 2/9  ?Decreased Interest 0 0 1 2 0  ?Down, Depressed, Hopeless 0 0 1 2 0  ?PHQ - 2 Score 0 0 2 4 0  ? ? ?Fall Screen: See Questionaire below. ?  ? ?  02/10/2022  ?  1:48 PM 02/04/2022  ?  3:21 PM 07/29/2021  ?  2:54 PM 01/28/2021  ?  2:00 PM 01/25/2021  ?  1:04 PM  ?Fall Risk   ?Falls in the past year? 0 0 0 1 0  ?Number falls in past yr: 0 0  0 0  ?Injury with Fall? 0   1 0  ?Risk for fall due to : Orthopedic patient   Impaired balance/gait;Impaired mobility;Orthopedic patient   ?Follow up Falls evaluation completed   Education provided;Falls prevention discussed;Falls evaluation completed   ? ? ?ADL screen:  See questionnaire below.  ?Functional Status  Survey: ?Is the patient deaf or have difficulty hearing?: No ?Does the patient have difficulty seeing, even when wearing glasses/contacts?: No ?Does the patient have difficulty concentrating, remembering, or making decisions?: Yes ?Does the patient have difficulty walking or climbing stairs?: Yes ?Does the patient have difficulty dressing or bathing?: No ?Does the patient have difficulty doing errands alone such as visiting a doctor's office or shopping?: No ? ? ?Review of Systems ? ?Constitutional: -, -unexpected weight change, -anorexia, -fatigue ?Allergy: -sneezing, -itching, -congestion ?Dermatology: denies changing moles, rash, lumps ?ENT: -runny nose, -ear pain, -sore throat,  ?Cardiology:  -chest pain, -palpitations, -orthopnea, ?Respiratory: -cough, -shortness of breath, -dyspnea on exertion,  -wheezing,  ?Gastroenterology: -abdominal pain, -nausea, -vomiting, -diarrhea, -constipation, -dysphagia ?Hematology: -bleeding or bruising problems ?Musculoskeletal: -arthralgias, -myalgias, -joint swelling, -back pain, - ?Ophthalmology: -vision changes,  ?Urology: -dysuria, -difficulty urinating,  -urinary frequency, -urgency, incontinence ?Neurology: -, -numbness, , -memory loss, -falls, -dizziness ? ? ? ?PHYSICAL EXAM: ?General Appearance: Alert, cooperative, no distress, appears stated age ?Head: Normocephalic, without obvious abnormality, atraumatic ?Eyes: PERRL, conjunctiva/corneas clear, EOM's intact,  ?Ears: Normal TM's and external ear canals ?Nose: Nares normal, mucosa normal, no drainage or sinus   tenderness ?Throat: Lips, mucosa, and tongue normal; teeth and gums normal ?Neck: Supple, no lymphadenopathy, thyroid:no enlargement/tenderness/nodules; no carotid bruit or JVD ?Lungs: Clear to auscultation bilaterally without wheezes, rales or ronchi; respirations unlabored ?Heart: Regular rate and rhythm, S1 and S2 normal, no murmur, rub or gallop ?Abdomen: Soft, non-tender, nondistended, normoactive bowel sounds, no masses, no hepatosplenomegaly ? ?Skin: Skin color, texture, turgor normal, no rashes or lesions ?Lymph nodes: Cervical, supraclavicular,  normal ?Neurologic: CNII-XII intact, normal strength, sensation and gait; reflexes 2+ and symmetric throughout   ?Psych: Normal mood, affect, hygiene and grooming ? ?ASSESSMENT/PLAN: ?Chronic pain syndrome ? ?Dependent edema - Plan: CBC with Differential/Platelet, Comprehensive metabolic panel ? ?Seasonal and perennial allergic rhinitis ? ?Chronic back pain, unspecified back location, unspecified back pain laterality ? ?Gastroesophageal reflux disease, unspecified whether esophagitis present ? ?Dysphagia, pharyngoesophageal phase ? ?PAF (paroxysmal atrial fibrillation) ([place]) ? ?Rupture of right quadriceps tendon, subsequent encounter ? ?Chronic obstructive  pulmonary disease, unspecified COPD type ([place]) ? ?Screening for lipid disorders - Plan: Lipid panel ?Continue follow-up with the various specialists he has a has been.  Continue on present medications. ?Continue with rehab for the quad and for his back. ? ?Immunization recommendations discussed.  Colonoscopy recommendations reviewed. ? ? ?Medicare Attestation ?I have personally reviewed: ?The patient's medical and social history ?Their use of alcohol, tobacco or illicit drugs ?Their current medications and supplements ?The patient's functional ability including ADLs,fall risks, home safety risks, cognitive, and hearing and visual impairment ?Diet and physical activities ?Evidence for depression or mood disorders ? ?The patient's weight, height, and BMI have been recorded in the chart.  I have made referrals, counseling, and provided education to the patient based on review of the above and I have provided the patient with a written personalized care plan for preventive services.   ? ? ?Jill Alexanders, MD   02/10/2022  ? ? ?  ?

## 2022-02-10 NOTE — Patient Instructions (Signed)
?  Kristopher Gay , ?Thank you for taking time to come for your Medicare Wellness Visit. I appreciate your ongoing commitment to your health goals. Please review the following plan we discussed and let me know if I can assist you in the future.  ? ?These are the goals we discussed: ?Continue with present medications and with exercise regimen. ?  ?This is a list of the screening recommended for you and due dates:  ?Health Maintenance  ?Topic Date Due  ? COVID-19 Vaccine (4 - Booster for Pfizer series) 02/26/2022*  ? Flu Shot  05/27/2022  ? Colon Cancer Screening  06/17/2027  ? Tetanus Vaccine  09/19/2029  ? Pneumonia Vaccine  Completed  ? Hepatitis C Screening: USPSTF Recommendation to screen - Ages 45-79 yo.  Completed  ? Zoster (Shingles) Vaccine  Completed  ? HPV Vaccine  Aged Out  ?*Topic was postponed. The date shown is not the original due date.  ?  ?

## 2022-02-11 LAB — COMPREHENSIVE METABOLIC PANEL
ALT: 19 IU/L (ref 0–44)
AST: 26 IU/L (ref 0–40)
Albumin/Globulin Ratio: 1.8 (ref 1.2–2.2)
Albumin: 4.2 g/dL (ref 3.8–4.8)
Alkaline Phosphatase: 76 IU/L (ref 44–121)
BUN/Creatinine Ratio: 22 (ref 10–24)
BUN: 23 mg/dL (ref 8–27)
Bilirubin Total: 0.7 mg/dL (ref 0.0–1.2)
CO2: 25 mmol/L (ref 20–29)
Calcium: 9.3 mg/dL (ref 8.6–10.2)
Chloride: 105 mmol/L (ref 96–106)
Creatinine, Ser: 1.04 mg/dL (ref 0.76–1.27)
Globulin, Total: 2.3 g/dL (ref 1.5–4.5)
Glucose: 96 mg/dL (ref 70–99)
Potassium: 4.8 mmol/L (ref 3.5–5.2)
Sodium: 141 mmol/L (ref 134–144)
Total Protein: 6.5 g/dL (ref 6.0–8.5)
eGFR: 77 mL/min/{1.73_m2} (ref >59)

## 2022-02-11 LAB — CBC WITH DIFFERENTIAL/PLATELET
Basophils Absolute: 0 10*3/uL (ref 0.0–0.2)
Basos: 1 %
EOS (ABSOLUTE): 0.1 10*3/uL (ref 0.0–0.4)
Eos: 2 %
Hematocrit: 38.7 % (ref 37.5–51.0)
Hemoglobin: 13.7 g/dL (ref 13.0–17.7)
Immature Grans (Abs): 0 10*3/uL (ref 0.0–0.1)
Immature Granulocytes: 0 %
Lymphocytes Absolute: 1.5 10*3/uL (ref 0.7–3.1)
Lymphs: 26 %
MCH: 33.5 pg — ABNORMAL HIGH (ref 26.6–33.0)
MCHC: 35.4 g/dL (ref 31.5–35.7)
MCV: 95 fL (ref 79–97)
Monocytes Absolute: 0.5 10*3/uL (ref 0.1–0.9)
Monocytes: 9 %
Neutrophils Absolute: 3.5 10*3/uL (ref 1.4–7.0)
Neutrophils: 62 %
Platelets: 145 10*3/uL — ABNORMAL LOW (ref 150–450)
RBC: 4.09 x10E6/uL — ABNORMAL LOW (ref 4.14–5.80)
RDW: 11.9 % (ref 11.6–15.4)
WBC: 5.6 10*3/uL (ref 3.4–10.8)

## 2022-02-11 LAB — LIPID PANEL
Chol/HDL Ratio: 2.4 ratio (ref 0.0–5.0)
Cholesterol, Total: 110 mg/dL (ref 100–199)
HDL: 46 mg/dL (ref >39)
LDL Chol Calc (NIH): 52 mg/dL (ref 0–99)
Triglycerides: 47 mg/dL (ref 0–149)
VLDL Cholesterol Cal: 12 mg/dL (ref 5–40)

## 2022-02-24 ENCOUNTER — Other Ambulatory Visit: Payer: Self-pay | Admitting: Internal Medicine

## 2022-06-12 ENCOUNTER — Other Ambulatory Visit: Payer: Self-pay | Admitting: Registered Nurse

## 2022-06-12 NOTE — Telephone Encounter (Signed)
PMP was Reviewed,  [name] has a  scheduled appointment in October.  Tramadol e- scribed today with 1 refill.

## 2022-07-02 ENCOUNTER — Encounter: Payer: Self-pay | Admitting: Internal Medicine

## 2022-07-05 ENCOUNTER — Other Ambulatory Visit: Payer: Self-pay | Admitting: Internal Medicine

## 2022-07-05 ENCOUNTER — Other Ambulatory Visit: Payer: Self-pay | Admitting: Registered Nurse

## 2022-07-26 ENCOUNTER — Other Ambulatory Visit: Payer: Self-pay | Admitting: Internal Medicine

## 2022-08-05 ENCOUNTER — Encounter: Payer: Self-pay | Admitting: Internal Medicine

## 2022-08-06 ENCOUNTER — Other Ambulatory Visit: Payer: Self-pay | Admitting: Registered Nurse

## 2022-08-13 ENCOUNTER — Other Ambulatory Visit: Payer: Medicare PPO

## 2022-08-18 ENCOUNTER — Encounter: Payer: Self-pay | Admitting: Internal Medicine

## 2022-08-21 ENCOUNTER — Encounter: Payer: Self-pay | Admitting: Registered Nurse

## 2022-08-21 ENCOUNTER — Encounter: Payer: Medicare PPO | Attending: Registered Nurse | Admitting: Registered Nurse

## 2022-08-21 VITALS — BP 134/70 | HR 65 | Ht 74.0 in | Wt 218.0 lb

## 2022-08-21 DIAGNOSIS — M25511 Pain in right shoulder: Secondary | ICD-10-CM | POA: Insufficient documentation

## 2022-08-21 DIAGNOSIS — Z5181 Encounter for therapeutic drug level monitoring: Secondary | ICD-10-CM

## 2022-08-21 DIAGNOSIS — M542 Cervicalgia: Secondary | ICD-10-CM

## 2022-08-21 DIAGNOSIS — G894 Chronic pain syndrome: Secondary | ICD-10-CM

## 2022-08-21 DIAGNOSIS — M25562 Pain in left knee: Secondary | ICD-10-CM | POA: Insufficient documentation

## 2022-08-21 DIAGNOSIS — Z79891 Long term (current) use of opiate analgesic: Secondary | ICD-10-CM

## 2022-08-21 DIAGNOSIS — M47817 Spondylosis without myelopathy or radiculopathy, lumbosacral region: Secondary | ICD-10-CM

## 2022-08-21 DIAGNOSIS — M25561 Pain in right knee: Secondary | ICD-10-CM | POA: Diagnosis present

## 2022-08-21 DIAGNOSIS — G8929 Other chronic pain: Secondary | ICD-10-CM

## 2022-08-21 DIAGNOSIS — S8410XS Injury of peroneal nerve at lower leg level, unspecified leg, sequela: Secondary | ICD-10-CM | POA: Diagnosis not present

## 2022-08-21 DIAGNOSIS — M546 Pain in thoracic spine: Secondary | ICD-10-CM | POA: Insufficient documentation

## 2022-08-21 DIAGNOSIS — M25512 Pain in left shoulder: Secondary | ICD-10-CM | POA: Diagnosis not present

## 2022-08-21 MED ORDER — TRAMADOL HCL 50 MG PO TABS: ORAL_TABLET | ORAL | 5 refills | Status: DC
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
## 2022-08-27 ENCOUNTER — Telehealth: Payer: Self-pay | Admitting: *Deleted

## 2022-08-27 LAB — TOXASSURE SELECT,+ANTIDEPR,UR

## 2022-08-27 NOTE — Telephone Encounter (Signed)
Urine drug screen for this encounter is consistent for prescribed medication. There is a very low level of THC and a first aberrant result. Could be from use of CBD product, unknown if he has used any of these products.

## 2022-09-21 ENCOUNTER — Other Ambulatory Visit: Payer: Self-pay | Admitting: Family Medicine

## 2022-09-21 DIAGNOSIS — R609 Edema, unspecified: Secondary | ICD-10-CM

## 2022-12-20 ENCOUNTER — Other Ambulatory Visit: Payer: Self-pay | Admitting: Internal Medicine

## 2022-12-20 ENCOUNTER — Other Ambulatory Visit: Payer: Self-pay | Admitting: Registered Nurse

## 2022-12-20 NOTE — Progress Notes (Unsigned)
Tear---------------  Patient ID: Kristopher Gay, male    DOB: [birth year], 72 y.o.   MRN: [id number]  HPI  M never smoker followed for allergic rhinitis and allergic asthma/ bronchitis, complicated by chronic musculoskeletal pain after an injury, GERD/ reflux, AFib Barium Swallow 04/04/14.2 Moderate gastroesophageal reflux to the level of mid esophagus Allergy Profile 10/16/16- Neg-no elevation for local environmental allergens CT chest 01/11/2016-IMPRESSION: 1. 2.2 cm left adrenal adenoma.ower lobe  2. New bronchial wall thickening and nodular opacities in the right likely reflecting bronchiolitis Office Spirometry 09/23/2016-mild restriction of exhaled volume. FVC 3.75/71%, FEV1 3.01/76%, ratio 0.80, FEF 25-75% 2.96/95%. ------------------------------------------------------------------------------------------------------------------- 12/20/21- 72 year old male never smoker followed for Allergic rhinitis, allergic asthma/bronchitis, complicated by chronic musculoskeletal pain after injury, GERD/reflux, grade 1 diastolic dysfunction, degenerative disc disease, AFib/ Eliquis, Cellulitis Left leg, Leg Brace for hx Quadriceps tear, HTN,  - Trelegy, flonase, singulair, Dymista nasal spray, [facility].  Covid vax-3 Phizer Flu vax-had  Patient states he had to stop going to the asthma office while in SNF after leg surgery and then transportation was an issue. Feels like his breathing is doing good.   He has had repeated right knee surgery after a quadriceps tendon tear.  Gradually more mobile.  Some swelling in legs but wears elastic hose.  Breathing is currently comfortable but aware of dyspnea on exertion.  He had been immobilized a long time and we discussed deconditioning.  Little cough or wheeze.  Medications are good but needs refill Singulair.                            CXR 03/08/21- 1V- IMPRESSION: Nodular-like densities overlying the left lung of unclear  etiology. Recommend CT chest for further evaluation.  12/22/22-72 year old male never smoker followed for Allergic rhinitis, allergic asthma/bronchitis, complicated by chronic musculoskeletal pain after injury, GERD/reflux, grade 1 diastolic dysfunction, degenerative disc disease, AFib/ Eliquis, Cellulitis Left leg, Leg Brace for hx Quadriceps tear, HTN,  - Trelegy, flonase, singulair, Dymista nasal spray, [facility].  Covid vax-3 Phizer Flu vax-  CXR 12/23/21- No active cardiopulmonary disease.  ROS-see HPI             + = positive Constitutional:   No-   weight loss, night sweats, fevers, chills, fatigue, lassitude. HEENT:   No-  headaches, difficulty swallowing, tooth/dental problems, +sore throat,       sneezing, itching, ear ache,  nasal congestion, +post nasal drip,  CV:  chest pain, no-orthopnea, PND, swelling in lower extremities, anasarca,  dizziness, palpitations Resp: No-   shortness of breath with exertion or at rest.             + productive cough,  + non-productive cough,  No- coughing up of blood.              No-   change in color of mucus.  +wheezing.   Skin: No-   rash or lesions. GI:  No-   heartburn, indigestion, abdominal pain, nausea, vomiting,  GU: . MS: +  joint pain or swelling.  No- decreased range of motion.  + back pain. Neuro-     nothing unusual Psych:  No- change in mood or affect. + depression or anxiety.  No memory loss.  OBJ- Physical Exam General- Alert, Oriented, Affect-appropriate, Distress- none acute. +tall Skin- rash-none, lesions- none, excoriation- none Lymphadenopathy- none Head- atraumatic  Eyes- Gross vision intact, PERRLA, conjunctivae and secretions clear            Ears- Hearing, canals-normal            Nose- Clear, no-Septal dev, mucus, polyps, erosion, perforation             Throat- Mallampati II , mucosa clear , drainage- none, tonsils- atrophic Neck- flexible , trachea midline, no stridor ,  thyroid nl, carotid no bruit Chest - symmetrical excursion , unlabored           Heart/CV- RRR , no murmur , no gallop  , no rub, nl s1 s2                           - JVD- none , edema- none, stasis changes- none, varices- none           Lung- clear, dullness-none, rub- none, cough-none           Chest wall- not obviously tender Abd-  Br/ Gen/ Rectal- Not done, not indicated Extrem- +elastic hose, +rolling walker   Neuro- grossly intact to observation

## 2022-12-22 ENCOUNTER — Ambulatory Visit: Payer: Medicare PPO | Admitting: Internal Medicine

## 2022-12-22 ENCOUNTER — Encounter: Payer: Self-pay | Admitting: Internal Medicine

## 2022-12-22 VITALS — BP 116/72 | HR 61 | Ht 74.0 in | Wt 224.2 lb

## 2022-12-22 DIAGNOSIS — J4520 Mild intermittent asthma, uncomplicated: Secondary | ICD-10-CM | POA: Diagnosis not present

## 2022-12-22 DIAGNOSIS — J3089 Other allergic rhinitis: Secondary | ICD-10-CM

## 2022-12-22 DIAGNOSIS — J302 Other seasonal allergic rhinitis: Secondary | ICD-10-CM

## 2022-12-22 MED ORDER — AZELASTINE-FLUTICASONE 137-50 MCG/ACT NA SUSP: NASAL | 12 refills | Status: AC

## 2022-12-22 MED ORDER — TRELEGY ELLIPTA 100-62.5-25 MCG/ACT IN AEPB: INHALATION_SPRAY | RESPIRATORY_TRACT | 12 refills | Status: DC

## 2022-12-22 NOTE — Assessment & Plan Note (Signed)
Dymista seems to work well when needed.  Spring pollen season has not hit for him yet. Plan-refill Dymista

## 2022-12-22 NOTE — Patient Instructions (Signed)
Refills sent for Trelegy maintenance asthma inhaler and for Dymista (azelastine-fluticasone) nasal spray  Please call if we can help

## 2022-12-22 NOTE — Assessment & Plan Note (Signed)
Asthmatic bronchitis moderate persistent controlled uncomplicated Plan-refill Trelegy

## 2022-12-24 ENCOUNTER — Telehealth: Payer: Self-pay

## 2022-12-24 ENCOUNTER — Other Ambulatory Visit (HOSPITAL_COMMUNITY): Payer: Self-pay

## 2022-12-24 NOTE — Telephone Encounter (Signed)
PA request received via pharmacy for Azelastine-Fluticasone 137-50MCG/ACT suspension  PA submitted via CMM to [facility] and has been APPROVED from 12/24/2022-10/27/2023.  Key: [id number]

## 2022-12-26 ENCOUNTER — Other Ambulatory Visit: Payer: Self-pay | Admitting: Internal Medicine

## 2023-01-15 ENCOUNTER — Other Ambulatory Visit: Payer: Self-pay | Admitting: Registered Nurse

## 2023-01-16 ENCOUNTER — Telehealth: Payer: Self-pay | Admitting: Registered Nurse

## 2023-01-16 NOTE — Telephone Encounter (Signed)
Requesting refill on Celecoxib  Please [phone] [facility] with any questions

## 2023-01-16 NOTE — Telephone Encounter (Signed)
Request completed.

## 2023-01-22 ENCOUNTER — Other Ambulatory Visit: Payer: Self-pay | Admitting: Registered Nurse

## 2023-02-12 ENCOUNTER — Ambulatory Visit (INDEPENDENT_AMBULATORY_CARE_PROVIDER_SITE_OTHER): Payer: Medicare PPO | Admitting: Family Medicine

## 2023-02-12 ENCOUNTER — Encounter: Payer: Self-pay | Admitting: Family Medicine

## 2023-02-12 VITALS — BP 126/72 | HR 77 | Temp 98.0°F | Resp 20 | Ht 72.0 in | Wt 222.0 lb

## 2023-02-12 DIAGNOSIS — Z0000 Encounter for general adult medical examination without abnormal findings: Secondary | ICD-10-CM | POA: Diagnosis not present

## 2023-02-12 DIAGNOSIS — I480 Paroxysmal atrial fibrillation: Secondary | ICD-10-CM

## 2023-02-12 DIAGNOSIS — G5621 Lesion of ulnar nerve, right upper limb: Secondary | ICD-10-CM | POA: Diagnosis not present

## 2023-02-12 DIAGNOSIS — J3089 Other allergic rhinitis: Secondary | ICD-10-CM

## 2023-02-12 DIAGNOSIS — G894 Chronic pain syndrome: Secondary | ICD-10-CM

## 2023-02-12 DIAGNOSIS — K219 Gastro-esophageal reflux disease without esophagitis: Secondary | ICD-10-CM | POA: Diagnosis not present

## 2023-02-12 DIAGNOSIS — E785 Hyperlipidemia, unspecified: Secondary | ICD-10-CM | POA: Diagnosis not present

## 2023-02-12 DIAGNOSIS — J302 Other seasonal allergic rhinitis: Secondary | ICD-10-CM

## 2023-02-12 DIAGNOSIS — S8410XS Injury of peroneal nerve at lower leg level, unspecified leg, sequela: Secondary | ICD-10-CM

## 2023-02-12 DIAGNOSIS — R609 Edema, unspecified: Secondary | ICD-10-CM

## 2023-02-12 MED ORDER — ATORVASTATIN CALCIUM 40 MG PO TABS: ORAL_TABLET | ORAL | 3 refills | Status: DC

## 2023-02-12 MED ORDER — FUROSEMIDE 20 MG PO TABS: ORAL_TABLET | ORAL | 1 refills | Status: DC

## 2023-02-12 MED ORDER — MONTELUKAST SODIUM 10 MG PO TABS: ORAL_TABLET | ORAL | 3 refills | Status: DC

## 2023-02-12 NOTE — Progress Notes (Signed)
Kristopher Gay is a 72 y.o. male who presents for annual wellness visit and follow-up on chronic medical conditions.  He continues to be followed for his chronic pain syndrome and presently is on Neurontin as well as Celebrex.  He still has concerns over anything else that might can be done for his neuropathy specifically the numbness.  He has concerns over "losing his leg" .  He has a history of atrial fibs and is scheduled for follow-up with cardiology.  He also has history of colonic polyps and will need a referral.  He plans to make that referral.  His immunizations are up to snuff.  His allergies seem to be under good control on his present medication regimen.  Presently he is not taking anything for reflux.  He does use Lasix for his leg edema.  He has a hard time comprehending the fact that there is nothing else that can be done for his neuropathy except present medication regimen and would like to be referred back to neurology to see if they have any other thoughts on how to handle his present problem specifically the numbness.  He continues on his asthma medicines and has recently seen Dr. Maple Gay for this.   Immunizations and Health Maintenance Immunization History  Administered Date(s) Administered   COVID-19, mRNA, vaccine(Comirnaty)12 years and older 01/28/2023   Fluad Quad(high Dose 65+) 08/08/2020   H1N1 10/06/2008   Influenza Split 08/08/2011, 09/24/2012, 08/27/2016   Influenza Whole 07/30/2009, 08/16/2010   Influenza, High Dose Seasonal PF 09/05/2016, 09/03/2018, 07/25/2022   Influenza,inj,Quad PF,6+ Mos 08/05/2013, 07/31/2014, 07/27/2015, 08/18/2017, 07/12/2019   Influenza-Unspecified 09/06/2021   PFIZER(Purple Top)SARS-COV-2 Vaccination 12/25/2019, 01/23/2020   Pfizer Covid-19 Vaccine Bivalent Booster 81yrs & up 09/06/2021   Pneumococcal Conjugate-13 09/19/2019   Pneumococcal Polysaccharide-23 11/20/2020   Respiratory Syncytial Virus Vaccine,Recomb Aduvanted(Arexvy) 06/20/2022    Tdap 09/20/2019   Unspecified SARS-COV-2 Vaccination 12/10/2020, 07/28/2022   Zoster Recombinat (Shingrix) 09/20/2019, 11/27/2019   There are no preventive care reminders to display for this patient.   Last colonoscopy: 2018 Last PSA: no result Dentist: more than 1 year ago Ophtho: within the last year Exercise: strength training and weight lifting   Other doctors caring for patient include: Cardiology- Dr. Rennis Gay Pulmonology- Dr. Maple Gay  Advanced Directives: No.Info given    Depression screen:  See questionnaire below.        02/12/2023    1:40 PM 08/21/2022    2:01 PM 02/10/2022    1:49 PM 02/04/2022    3:21 PM 01/25/2021    1:05 PM  Depression screen PHQ 2/9  Decreased Interest 0 1 0 0 1  Down, Depressed, Hopeless 0 1 0 0 1  PHQ - 2 Score 0 2 0 0 2    Fall Screen: See Questionaire below.      02/12/2023    1:39 PM 08/21/2022    2:08 PM 02/10/2022    1:48 PM 02/04/2022    3:21 PM 07/29/2021    2:54 PM  Fall Risk   Falls in the past year? 1 1 0 0 0  Comment  Last fall in July 2023. Right forearm swelling after fall.     Number falls in past yr: 1 1 0 0   Injury with Fall? 1 1 0    Comment  No follow up.     Risk for fall due to : History of fall(s);Impaired balance/gait  Orthopedic patient    Follow up Falls evaluation completed;Falls prevention discussed  Falls evaluation completed  ADL screen:  See questionnaire below.  Functional Status Survey: Is the patient deaf or have difficulty hearing?: No Does the patient have difficulty seeing, even when wearing glasses/contacts?: No Does the patient have difficulty concentrating, remembering, or making decisions?: Yes Does the patient have difficulty walking or climbing stairs?: Yes Does the patient have difficulty dressing or bathing?: Yes Does the patient have difficulty doing errands alone such as visiting a doctor's office or shopping?: Yes   Review of Systems  Constitutional: -, -unexpected weight change,  -anorexia, -fatigue Dermatology: denies changing moles, rash, lumps ENT: -runny nose, -ear pain, -sore throat,  Cardiology:  -chest pain, -palpitations, -orthopnea, Respiratory: -cough, -shortness of breath, -dyspnea on exertion, -wheezing,  Gastroenterology: -abdominal pain, -nausea, -vomiting, -diarrhea, -constipation, -dysphagia Hematology: -bleeding or bruising problems Musculoskeletal: -arthralgias, -myalgias, -joint swelling, -back pain, - Ophthalmology: -vision changes,  Urology: -dysuria, -difficulty urinating,  -urinary frequency, -urgency, incontinence Neurology: -, -numbness, , -memory loss, -falls, -dizziness    PHYSICAL EXAM:  BP 126/72   Pulse 77   Temp 98 F (36.7 C) (Oral)   Resp 20   Ht 6' (1.829 m)   Wt 222 lb (100.7 kg)   SpO2 95% Comment: room air  BMI 30.11 kg/m   General Appearance: Alert, cooperative, no distress, appears stated age Head: Normocephalic, without obvious abnormality, atraumatic Eyes: PERRL, conjunctiva/corneas clear, EOM's intact,  Ears: Normal TM's and external ear canals Nose: Nares normal, mucosa normal, no drainage or sinus   tenderness Throat: Lips, mucosa, and tongue normal; teeth and gums normal Neck: Supple, no lymphadenopathy, thyroid:no enlargement/tenderness/nodules; no carotid bruit or JVD Lungs: Clear to auscultation bilaterally without wheezes, rales or ronchi; respirations unlabored Heart: Regular rate and rhythm, S1 and S2 normal, no murmur, rub or gallop Abdomen: Soft, non-tender, nondistended, normoactive bowel sounds, no masses, no hepatosplenomegaly Skin: Skin color, texture, turgor normal, no rashes or lesions Lymph nodes: Cervical, supraclavicular, and axillary nodes normal Neurologic: CNII-XII intact, normal strength, sensation and gait; reflexes 2+ and symmetric throughout   Psych: Normal mood, affect, hygiene and grooming  ASSESSMENT/PLAN: Chronic pain syndrome - Plan: Ambulatory referral to Neurology  PAF  (paroxysmal atrial fibrillation) - Plan: CBC with Differential/Platelet, Comprehensive metabolic panel  Seasonal and perennial allergic rhinitis - Plan: montelukast (SINGULAIR) 10 MG tablet  Gastroesophageal reflux disease, unspecified whether esophagitis present  Ulnar neuropathy at elbow, right  Injury of peroneal nerve, unspecified laterality, sequela  Dependent edema - Plan: furosemide (LASIX) 20 MG tablet  Hyperlipidemia, unspecified hyperlipidemia type - Plan: Lipid panel, atorvastatin (LIPITOR) 40 MG tablet   He seems to think that there might be something missing to help with his neuropathy specifically the numbness that he is feeling.  He has in the past been seen by neurology as well as pain management and apparently Neurontin is not helping.  He would like to see if there is anything else that can be done. immunization recommendations discussed.  Colonoscopy recommendations reviewed.   Medicare Attestation I have personally reviewed: The patient's medical and social history Their use of alcohol, tobacco or illicit drugs Their current medications and supplements The patient's functional ability including ADLs,fall risks, home safety risks, cognitive, and hearing and visual impairment Diet and physical activities Evidence for depression or mood disorders  The patient's weight, height, and BMI have been recorded in the chart.  I have made referrals, counseling, and provided education to the patient based on review of the above and I have provided the patient with a written personalized care plan for  preventive services.     Sharlot Gowda, MD   02/12/2023

## 2023-02-13 LAB — CBC WITH DIFFERENTIAL/PLATELET
Basophils Absolute: 0 10*3/uL (ref 0.0–0.2)
Basos: 0 %
EOS (ABSOLUTE): 0.1 10*3/uL (ref 0.0–0.4)
Eos: 1 %
Hematocrit: 38.2 % (ref 37.5–51.0)
Hemoglobin: 13.1 g/dL (ref 13.0–17.7)
Immature Grans (Abs): 0 10*3/uL (ref 0.0–0.1)
Immature Granulocytes: 0 %
Lymphocytes Absolute: 1.2 10*3/uL (ref 0.7–3.1)
Lymphs: 10 %
MCH: 32.6 pg (ref 26.6–33.0)
MCHC: 34.3 g/dL (ref 31.5–35.7)
MCV: 95 fL (ref 79–97)
Monocytes Absolute: 0.9 10*3/uL (ref 0.1–0.9)
Monocytes: 7 %
Neutrophils Absolute: 9.7 10*3/uL — ABNORMAL HIGH (ref 1.4–7.0)
Neutrophils: 82 %
Platelets: 225 10*3/uL (ref 150–450)
RBC: 4.02 x10E6/uL — ABNORMAL LOW (ref 4.14–5.80)
RDW: 11.8 % (ref 11.6–15.4)
WBC: 11.9 10*3/uL — ABNORMAL HIGH (ref 3.4–10.8)

## 2023-02-13 LAB — COMPREHENSIVE METABOLIC PANEL
ALT: 24 IU/L (ref 0–44)
AST: 24 IU/L (ref 0–40)
Albumin/Globulin Ratio: 1.4 (ref 1.2–2.2)
Albumin: 4.1 g/dL (ref 3.8–4.8)
Alkaline Phosphatase: 106 IU/L (ref 44–121)
BUN/Creatinine Ratio: 23 (ref 10–24)
BUN: 26 mg/dL (ref 8–27)
Bilirubin Total: 0.3 mg/dL (ref 0.0–1.2)
CO2: 21 mmol/L (ref 20–29)
Calcium: 9.2 mg/dL (ref 8.6–10.2)
Chloride: 104 mmol/L (ref 96–106)
Creatinine, Ser: 1.14 mg/dL (ref 0.76–1.27)
Globulin, Total: 3 g/dL (ref 1.5–4.5)
Glucose: 99 mg/dL (ref 70–99)
Potassium: 4.4 mmol/L (ref 3.5–5.2)
Sodium: 144 mmol/L (ref 134–144)
Total Protein: 7.1 g/dL (ref 6.0–8.5)
eGFR: 69 mL/min/{1.73_m2} (ref >59)

## 2023-02-13 LAB — LIPID PANEL
Chol/HDL Ratio: 3 ratio (ref 0.0–5.0)
Cholesterol, Total: 117 mg/dL (ref 100–199)
HDL: 39 mg/dL — ABNORMAL LOW (ref >39)
LDL Chol Calc (NIH): 64 mg/dL (ref 0–99)
Triglycerides: 63 mg/dL (ref 0–149)
VLDL Cholesterol Cal: 14 mg/dL (ref 5–40)

## 2023-02-16 ENCOUNTER — Encounter: Payer: Self-pay | Admitting: Registered Nurse

## 2023-02-16 ENCOUNTER — Encounter: Payer: Self-pay | Admitting: Family Medicine

## 2023-02-16 ENCOUNTER — Encounter: Payer: Medicare PPO | Attending: Registered Nurse | Admitting: Registered Nurse

## 2023-02-16 ENCOUNTER — Telehealth: Payer: Self-pay | Admitting: Family Medicine

## 2023-02-16 VITALS — BP 138/79 | HR 61 | Wt 227.0 lb

## 2023-02-16 DIAGNOSIS — G8929 Other chronic pain: Secondary | ICD-10-CM

## 2023-02-16 DIAGNOSIS — M542 Cervicalgia: Secondary | ICD-10-CM | POA: Diagnosis not present

## 2023-02-16 DIAGNOSIS — M25512 Pain in left shoulder: Secondary | ICD-10-CM

## 2023-02-16 DIAGNOSIS — Z79891 Long term (current) use of opiate analgesic: Secondary | ICD-10-CM | POA: Diagnosis present

## 2023-02-16 DIAGNOSIS — M25562 Pain in left knee: Secondary | ICD-10-CM | POA: Diagnosis not present

## 2023-02-16 DIAGNOSIS — M546 Pain in thoracic spine: Secondary | ICD-10-CM | POA: Diagnosis not present

## 2023-02-16 DIAGNOSIS — M47817 Spondylosis without myelopathy or radiculopathy, lumbosacral region: Secondary | ICD-10-CM

## 2023-02-16 DIAGNOSIS — G629 Polyneuropathy, unspecified: Secondary | ICD-10-CM

## 2023-02-16 DIAGNOSIS — G894 Chronic pain syndrome: Secondary | ICD-10-CM | POA: Diagnosis not present

## 2023-02-16 DIAGNOSIS — Z5181 Encounter for therapeutic drug level monitoring: Secondary | ICD-10-CM

## 2023-02-16 DIAGNOSIS — M25511 Pain in right shoulder: Secondary | ICD-10-CM

## 2023-02-16 DIAGNOSIS — S8410XS Injury of peroneal nerve at lower leg level, unspecified leg, sequela: Secondary | ICD-10-CM

## 2023-02-16 DIAGNOSIS — M25561 Pain in right knee: Secondary | ICD-10-CM

## 2023-02-16 MED ORDER — TRAMADOL HCL 50 MG PO TABS: ORAL_TABLET | ORAL | 5 refills | Status: DC

## 2023-02-16 NOTE — Telephone Encounter (Signed)
Dr Nita Sickle saw pt in 2020

## 2023-02-16 NOTE — Telephone Encounter (Signed)
Reviewed referral to Dr Adriana Mccallum, he states [facility] is no longer an established patient there and that pt called ad advised Neuropathy.  Dr. Adriana Mccallum is recommending pt be referred to Dr. Loleta Chance or Dr. Allena Katz.

## 2023-02-16 NOTE — Telephone Encounter (Signed)
Patient called, very upset.  He states you are too old to work, you did not listen to anything he told you over and over.  He states you refilled meds that didn't need to be refilled and that were not yours to refill.  Pt states Dr. Adriana Mccallum will not take him on as patient as you listed CHRONIC PAIN and Dr Adriana Mccallum does not do chronic pain.  He states you should have listed Advanced Neopathy and almost complete numbness.Marland Kitchen  He is afraid he is going to lose his leg and no one cares.  He has less than 90% range of motion and leg is discolored.  I told him I would call him back regarding referral.
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
## 2023-02-16 NOTE — Telephone Encounter (Signed)
Advised patient that he would be hearing from Dr Eliane Decree office.  Also that the meds were sent as a courtesy and he said insurance would not pay for them as they were just filled.  I explained he did not have to pick them up.  Letter being sent

## 2023-02-17 ENCOUNTER — Encounter: Payer: Self-pay | Admitting: Neurology

## 2023-03-09 ENCOUNTER — Encounter: Payer: Self-pay | Admitting: Neurology

## 2023-03-09 ENCOUNTER — Telehealth: Payer: Self-pay | Admitting: Neurology

## 2023-03-09 ENCOUNTER — Ambulatory Visit: Payer: Medicare PPO | Admitting: Neurology

## 2023-03-09 VITALS — BP 140/62 | HR 62 | Ht 72.0 in | Wt 223.0 lb

## 2023-03-09 DIAGNOSIS — R200 Anesthesia of skin: Secondary | ICD-10-CM | POA: Diagnosis not present

## 2023-03-09 DIAGNOSIS — M5416 Radiculopathy, lumbar region: Secondary | ICD-10-CM | POA: Diagnosis not present

## 2023-03-09 DIAGNOSIS — G629 Polyneuropathy, unspecified: Secondary | ICD-10-CM | POA: Diagnosis not present

## 2023-03-09 NOTE — Patient Instructions (Addendum)
MRI lumbar spine will be ordered A referral to [facility] has been placed for your MRI someone will contact you directly to schedule your appt. They are located at [address] [facility]. Please contact them directly by calling [phone] with any questions regarding your referral.

## 2023-03-09 NOTE — Progress Notes (Signed)
[facility] Clinic Note - Initial Visit   Date: 03/09/2023   Kristopher Gay MRN: [id number] DOB: May 14, 1951   Dear Dr. Susann Givens:  Thank you for your kind referral of Kristopher Gay for consultation of neuropathy. Although his history is well known to you, please allow [place] to reiterate it for the purpose of our medical record. The patient was accompanied to the clinic by self.    Kristopher Gay is a 72 y.o. right-handed male with COPD, atrial fibrillation, chronic pain syndrome, and asthma presenting for evaluation of neuropathy.   IMPRESSION/PLAN: Longstanding history of idiopathic neuropathy affecting the hand and legs, confirmed by EMG Asymmetric right leg numbness maybe due to right lumbar radiculopathy.  Imaging from 2008 shows right L4 and L5 nerve root encroachment, which would explain his worsening right leg symptoms.  I recommend updated MRI lumbar spine to help with understanding the nature of his symptoms.  He is no interested in surgery and completed his own physical therapy exercises which helps low back discomfort. He is seeking therapy for numbness and I explained that there is no effective treatment for numbness. He has multiple questions which I answered to the best of my ability.   ------------------------------------------------------------- History of present illness: He has long history of peripheral neuropathy with EMGs dating back to 2008 which have confirmed this. His EMG of the right arm in 2020 also shows neuropathy involving the upper extremity.  He reports having right knee surgery in September 2022 and since this time, his right leg has been numb, from his knee down into the foot.  In the left leg, numbness is from the lower leg into the foot.  He is frustrated that no one is able to fix the numbness.  He is followed by pain management for chronic pain. He has some numbness.    Out-side paper records, electronic medical record, and images have  been reviewed where available and summarized as:  NCS/EMG of the arms 06/02/2019 at [facility]: Severe right ulnar neuropathy at the elbow, which is mildly improved from prior study on April 15, 2018. Chronic sensorimotor predominantly axonal polyneuropathy affecting the right upper extremity.  With the presence of temporal dispersion, a polyradiculoneuropathy cannot be excluded.  Correlate clinically.   Chronic C7 radiculopathy affecting the right upper extremity, moderate.  NCS/EMG of the legs 03/08/2010 at [facility]:  findings consistent with a sensorimotor polyneuropathy with axonal features.   MRI lumbar spine 11/08/2006: 1.  The dominant right-sided abnormality is at L4-5 where asymmetric disc space narrowing and bony overgrowth result in right L-4 and right L-5 nerve root encroachment.  2.  Similar asymmetric disc space narrowing related to scoliotic deformity L3-4 on the left compresses the left L-3 nerve root.  3.  Wide spread disc space narrowing L-1 through S-1 with end plate reactive changes.   No results found for: "HGBA1C" No results found for: "VITAMINB12" Lab Results  Component Value Date   TSH 1.970 11/17/2011   Lab Results  Component Value Date   ESRSEDRATE 60 (H) 03/08/2021    Past Medical History:  Diagnosis Date   A-fib (HCC)    Allergic rhinitis, cause unspecified    Allergy    Anxiety    Blood clot in vein 2002   left calf SUPERFICIAL CLOT REMOVED THEN PART OF VEIN REMOVED   Chronic headaches    COPD (chronic obstructive pulmonary disease) (HCC)    Cough    X 1 YEAR NO FEVER CLEAR SPUTUM  DDD (degenerative disc disease)    Depression    DJD (degenerative joint disease)    ALL THE WAY DOWN SPINE   Dropfoot    LEFT , NO BRACE WORN NOW   Dysrhythmia    a fib one time after hernia surgery   Emphysema of lung (HCC)    Extrinsic asthma, unspecified    GERD (gastroesophageal reflux disease)    OCC NO MEDS FOR   Hypertension    STOPPED HTN MEDS UNTIL 2011, THEN HAD  WEIGHT LOSS, NO MEDS SINCE   Neuromuscular disorder (HCC)    neuropathy  legs and feet   Neuropathy    POLYNEUOPATHOPATHY FEET AND LEGS   Peripheral vascular disease (HCC)    VARICOSE VEINS LEFT LEG   Pneumonia AS CHILD   PONV (postoperative nausea and vomiting)    Syncope    Ulnar nerve entrapment at elbow, right    Umbilical hernia    Unspecified asthma(493.90)     Past Surgical History:  Procedure Laterality Date   COLONSCOPY  06/16/2017   HERNIA REPAIR     INGUINAL HERNIA REPAIR Bilateral 07/20/2017   Procedure: LAPAROSCOPIC BILATERAL INGUINAL HERNIA REPAIR WITH MESH, UMBILICAL HERNIA REPAIR AND LYSIS OF ADHESIONS;  Surgeon: [name], De Blanch, MD;  Location: [facility];  Service: General;  Laterality: Bilateral;   LAPAROSCOPY N/A 07/20/2017   Procedure: LAPAROSCOPY DIAGNOSTIC;  Surgeon: Rodman Pickle, MD;  Location: [facility];  Service: General;  Laterality: N/A;   QUADRICEPS TENDON REPAIR Right 08/07/2020   Procedure: REPAIR QUADRICEP TENDON;  Surgeon: Sheral Apley, MD;  Location: [facility];  Service: Orthopedics;  Laterality: Right;  NEED RAPID TEST;SAME DAY WORKUP; COMING FROM [facility]   QUADRICEPS TENDON REPAIR Right 02/26/2021   Procedure: REPAIR QUADRICEP TENDON;  Surgeon: Sheral Apley, MD;  Location: [facility];  Service: Orthopedics;  Laterality: Right;   ROTATOR CUFF REPAIR Left 2005   detached; left shoulder-reattached   SHOULDER SURGERY Right    laBRAL  tendon torn   SUPERFICIAL LEFT LEG VEIN STRIPPING WITH SMALL SUPERFICIAL CLOT  2002   ULNAR NERVE TRANSPOSITION Right 05/10/2018   Procedure: RIGHT ELBOW ULNAR NERVE RELEASE;  Surgeon: Bradly Bienenstock, MD;  Location: [facility];  Service: Orthopedics;  Laterality: Right;   UMBILICAL HERNIA REPAIR  02/18/2018   UMBILICAL HERNIA REPAIR N/A 02/18/2018   Procedure: LAPAROSCOPIC UMBILICAL HERNIA REPAIR ERAS PATHWAY;  Surgeon: [name], De Blanch, MD;  Location: [facility];  Service: General;  Laterality: N/A;      Medications:  Outpatient Encounter Medications as of 03/09/2023  Medication Sig   acetaminophen (TYLENOL) 500 MG tablet Take 2 tablets (1,000 mg total) by mouth every 6 (six) hours as needed for mild pain or moderate pain.   ascorbic acid (VITAMIN C) 1000 MG tablet Take 2,000 mg by mouth daily.   atorvastatin (LIPITOR) 40 MG tablet TAKE 1 TABLET(40 MG) BY MOUTH DAILY   Azelastine-Fluticasone 137-50 MCG/ACT SUSP 1-2 puffs each nostril twice daily as needed   Calcium Carbonate-Vit D-Min (CALCIUM 1200 PO) Take 1 tablet by mouth in the morning and at bedtime.   celecoxib (CELEBREX) 100 MG capsule TAKE 1 CAPSULE BY MOUTH TWICE DAILY   Cholecalciferol (VITAMIN D3) 50 MCG (2000 UT) capsule Take 2,000 Units by mouth in the morning and at bedtime.   cyanocobalamin 1000 MCG tablet Take 1,000 mcg by mouth in the morning and at bedtime.   DHEA 25 MG CAPS Take 25 mg by mouth daily.   docusate  sodium (DOK) 100 MG capsule    EPINEPHrine 0.3 mg/0.3 mL IJ SOAJ injection Inject 0.3 mg into the muscle as needed for anaphylaxis.   fluticasone-salmeterol (WIXELA INHUB) 500-50 MCG/ACT AEPB    Fluticasone-Umeclidin-Vilant (TRELEGY ELLIPTA) 100-62.5-25 MCG/ACT AEPB Inhale 1 puff then rinse mouth, once daily   furosemide (LASIX) 20 MG tablet TAKE 1 TABLET(20 MG) BY MOUTH DAILY   gabapentin (NEURONTIN) 300 MG capsule TAKE 2 CAPSULES BY MOUTH EVERY MORNING, 1 CAPSULE MID DAY AND 2 CAPSULES AT BEDTIME   Glucosamine HCl-MSM (GLUCOSAMINE-MSM PO) Take 1 tablet by mouth in the morning and at bedtime.   ibuprofen (ADVIL) 800 MG tablet    Magnesium 250 MG TABS Take 250 mg by mouth daily.   metoprolol succinate (TOPROL-XL) 25 MG 24 hr tablet TAKE 1 TABLET(25 MG) BY MOUTH DAILY   montelukast (SINGULAIR) 10 MG tablet TAKE 1 TABLET(10 MG) BY MOUTH DAILY   Multiple Vitamin (MULTIVITAMIN) tablet Take 1 tablet by mouth 2 (two) times daily.    nitroGLYCERIN (NITROSTAT) 0.4 MG SL tablet Place 0.4 mg under the tongue every 5  (five) minutes as needed for chest pain.   Omega-3 Fatty Acids (FISH OIL) 1200 MG CAPS Take 1,200 mg by mouth in the morning and at bedtime.   Polyethylene Glycol 3350 (MIRALAX PO)    traMADol (ULTRAM) 50 MG tablet TAKE 2 TABLETS BY MOUTH EVERY 6 HOURS AS NEEDED FOR PAIN   vitamin E 180 MG (400 UNITS) capsule Take 400 Units by mouth in the morning and at bedtime.   No facility-administered encounter medications on file as of 03/09/2023.    Allergies:  Allergies  Allergen Reactions   Bee Venom Swelling and Other (See Comments)   Linaclotide Other (See Comments)    Abdominal pain Other reaction(s): Not available   Molds & Smuts Other (See Comments)    Unknown Other reaction(s): Not available   Seasonal Ic [Cholestatin] Other (See Comments)    Environmental Allergies    Family History: Family History  Problem Relation Age of Onset   Pancreatic cancer Mother    Breast cancer Sister    Epilepsy Brother    Adrenal disorder Neg Hx     Social History: Social History   Tobacco Use   Smoking status: Never   Smokeless tobacco: Never  Vaping Use   Vaping Use: Never used  Substance Use Topics   Alcohol use: No    Alcohol/week: 0.0 standard drinks of alcohol   Drug use: No   Social History   Social History Narrative   Right Handed    Lives in a one story home. Lives alone.     Vital Signs:  BP (!) 140/62   Pulse 62   Ht 6' (1.829 m)   Wt 223 lb (101.2 kg)   SpO2 94%   BMI 30.24 kg/m    Neurological Exam: MENTAL STATUS including orientation to time, place, person, recent and remote memory, attention span and concentration, language, and fund of knowledge is normal.  Speech is not dysarthric.  CRANIAL NERVES: II:  No visual field defects.     III-IV-VI: Pupils equal round and reactive to light.  Normal conjugate, extra-ocular eye movements in all directions of gaze.  No nystagmus.  No ptosis.   V:  Normal facial sensation.    VII:  Normal facial symmetry and  movements.   VIII:  Normal hearing and vestibular function.   IX-X:  Normal palatal movement.   XI:  Normal shoulder shrug and head  rotation.   XII:  Normal tongue strength and range of motion, no deviation or fasciculation.  MOTOR:  Intrinsic hand muscle atrophy, No fasciculations or abnormal movements.  No pronator drift.   Upper Extremity:  Right  Left  Deltoid  5/5   5/5   Biceps  5/5   5/5   Triceps  5/5   5/5   Wrist extensors  5/5   5/5   Wrist flexors  5/5   5/5   Finger extensors  5/5   5/5   Finger flexors  5/5   5/5   Dorsal interossei  4/5   4/5   Abductor pollicis  5/5   5/5   Tone (Ashworth scale)  0  0   Lower Extremity:  Right  Left  Hip flexors  5-/5   5/5   Knee flexors  5/5   5/5   Knee extensors  5/5   5/5   Dorsiflexors  5/5   5/5   Plantarflexors  5/5   5/5   Toe extensors  5/5   5/5   Toe flexors  5/5   5/5   Tone (Ashworth scale)  0  0   MSRs:                                           Right        Left brachioradialis 2+  2+  biceps 2+  2+  triceps 2+  2+  patellar 2+  2+  ankle jerk 2+  2+  Hoffman no  no  plantar response down  down   SENSORY:  Normal and symmetric perception of light touch, pinprick, vibration, and proprioception.  Romberg's sign absent.   COORDINATION/GAIT: Normal finger-to- nose-finger.  Intact rapid alternating movements bilaterally.   Gait wide based and stable.    Total time spent reviewing records, interview, history/exam, documentation, and coordination of care on day of encounter:  45 min   Thank you for allowing me to participate in patient's care.  If I can answer any additional questions, I would be pleased to do so.    Sincerely,    Raley Novicki K. Allena Katz, DO

## 2023-03-09 NOTE — Telephone Encounter (Signed)
Patient had an appt with Dr.Patel today. He would like to know if it will hurt if he uses a TENS unit

## 2023-03-09 NOTE — Telephone Encounter (Signed)
TENS unit is typically used for low back pain, not numbness.  He would need to try it to know whether it is helpful to him or not.

## 2023-03-10 NOTE — Telephone Encounter (Signed)
Called patient and left a detailed message per DPR that of Dr. Eliane Decree advice. Left call back number in case patient had any questions.

## 2023-03-26 ENCOUNTER — Other Ambulatory Visit (HOSPITAL_COMMUNITY): Payer: Self-pay

## 2023-03-26 ENCOUNTER — Encounter: Payer: Self-pay | Admitting: Neurology

## 2023-03-26 NOTE — Progress Notes (Signed)
Cardiology Clinic Note   Date: 03/27/2023 ID: Kristopher Gay, DOB Sep 21, 1951, MRN [id number]  Primary Cardiologist:  Chrystie Nose, MD  Patient Profile    Kristopher Gay is a 72 y.o. male who presents to the clinic today for routine follow up.   Past medical history significant for: Nonobstructive CAD. Coronary CTA 07/15/2019: Calcium score 8 (22nd percentile).  Nonobstructive CAD in mid LAD. PAF. Onset April 2019. Echo 02/22/2018: EF 50 to 55%.  Grade I DD.  Mild BAE.  TR.  Moderate to severely increased PA pressure. Hyperlipidemia. Lipid panel 02/12/2023: LDL 64, HDL 39, TG 63, total 117. GERD.   History of Present Illness    Kristopher Gay was first evaluated by Dr. Rennis Golden on 02/20/2018 for new onset A-fib during hospitalization for laparoscopic umbilical hernia repair performed on 02/18/2018.  He was started on Toprol and Eliquis with plan for removal of 3-week rate control and anticoagulation for possible outpatient cardioversion.  Spontaneous conversion to NSR before discharge on 02/22/2018.  At follow-up visit on 03/24/2018 patient was in sinus rhythm and was encouraged to continue taking Eliquis for 3 months.  Eliquis was stopped on 06/25/2018.  Was last seen in the office by Dr. Rennis Golden on 12/03/2021 for routine follow-up.  He had not had any recurrence of A-fib.  No medication changes were made  Today, patient is doing well from a cardiac standpoint. He denies shortness of breath or dyspnea on exertion. No chest pain, pressure, or tightness. Denies lower extremity edema, orthopnea, or PND. No palpitations. He is very active mowing his yard. He also lifts weight at the gym daily. 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 He has noticed some venous stasis color changes of his lower legs. He denies claudication stating that his legs feel better when he is moving. Patient is concerned about his BP. He states at other doctor visits  his SBP as been 140. He does not check his BP at home. He is normotensive today. He states at other appointments they only check it once at intake but never recheck it. Discussed with him that I do not feel he needs any other medication to control his BP. He is in agreement.      ROS: All other systems reviewed and are otherwise negative except as noted in History of Present Illness.  Studies Reviewed    ECG personally reviewed by me today: Sinus bradycardia, 57 bpm.  No significant changes from 12/03/2021.  Risk Assessment/Calculations     CHA2DS2-VASc Score = 1   This indicates a 0.6% annual risk of stroke. The patient's score is based upon: CHF History: 0 HTN History: 0 Diabetes History: 0 Stroke History: 0 Vascular Disease History: 0 Age Score: 1 Gender Score: 0              Physical Exam    VS:  BP 125/72 (BP Location: Left Arm, Patient Position: Sitting, Cuff Size: Large)   Pulse (!) 57   Ht 6\' 3"  (1.905 m)   Wt 224 lb (101.6 kg)   SpO2 96%   BMI 28.00 kg/m  , BMI Body mass index is 28 kg/m.  GEN: Well nourished, well developed, in no acute distress. Neck: No JVD or carotid bruits. Cardiac: RRR. No murmurs. No rubs or gallops.   Respiratory:  Respirations regular and unlabored. Clear to auscultation without rales, wheezing or rhonchi. GI: Soft, nontender, nondistended. Extremities: Radials/DP/PT  2+ and equal bilaterally. No clubbing or cyanosis. No edema.  Skin: Warm and dry, no rash. Neuro: Strength intact.  Assessment & Plan    Nonobstructive CAD.  Coronary CTA September 2020 showed a calcium score of 8 with nonobstructive CAD and mid LAD.  Patient denies chest pain, tightness, pressure. He is very active mowing his yard and lifting weights at the gym daily.  Continue atorvastatin, metoprolol. PAF.  Onset April 2019 postop laparoscopic umbilical hernia repair.  Patient was anticoagulated for 3 months.  He has had no further episodes of A-fib and is no longer  on anticoagulation.  Patient denies any further episodes of afib. EKG shows sinus bradycardia today.  Continue metoprolol. Hyperlipidemia.  LDL April 2024 64, at goal.  Continue atorvastatin.  Disposition: Return in 1 year or sooner as needed.          Signed, Etta Grandchild. Da Authement, DNP, NP-C

## 2023-03-27 ENCOUNTER — Ambulatory Visit: Payer: Medicare PPO | Attending: Student | Admitting: Student

## 2023-03-27 ENCOUNTER — Encounter: Payer: Self-pay | Admitting: Student

## 2023-03-27 VITALS — BP 125/72 | HR 57 | Ht 75.0 in | Wt 224.0 lb

## 2023-03-27 DIAGNOSIS — I480 Paroxysmal atrial fibrillation: Secondary | ICD-10-CM

## 2023-03-27 DIAGNOSIS — E785 Hyperlipidemia, unspecified: Secondary | ICD-10-CM | POA: Diagnosis not present

## 2023-03-27 DIAGNOSIS — I2510 Atherosclerotic heart disease of native coronary artery without angina pectoris: Secondary | ICD-10-CM

## 2023-03-27 NOTE — Patient Instructions (Signed)
Medication Instructions:  No Changes In Medications at this time.   *If you need a refill on your cardiac medications before your next appointment, please call your pharmacy*  Follow-Up: At [facility], you and your health needs are our priority.  As part of our continuing mission to provide you with exceptional heart care, we have created designated Provider Care Teams.  These Care Teams include your primary Cardiologist (physician) and Advanced Practice Providers (APPs -  Physician Assistants and Nurse Practitioners) who all work together to provide you with the care you need, when you need it.  Your next appointment:   1 year(s)  Provider:   Kenneth C [name], MD     

## 2023-04-02 ENCOUNTER — Ambulatory Visit
Admission: RE | Admit: 2023-04-02 | Discharge: 2023-04-02 | Disposition: A | Discharge status: Home / Self Care | Payer: Medicare PPO | Source: Ambulatory Visit | Attending: Neurology | Admitting: Neurology

## 2023-04-02 DIAGNOSIS — G629 Polyneuropathy, unspecified: Secondary | ICD-10-CM

## 2023-04-02 DIAGNOSIS — M5416 Radiculopathy, lumbar region: Secondary | ICD-10-CM

## 2023-04-02 DIAGNOSIS — R200 Anesthesia of skin: Secondary | ICD-10-CM

## 2023-04-13 ENCOUNTER — Other Ambulatory Visit: Payer: Self-pay | Admitting: Registered Nurse

## 2023-04-14 ENCOUNTER — Telehealth: Payer: Self-pay | Admitting: Registered Nurse

## 2023-04-14 NOTE — Telephone Encounter (Signed)
Pharmacy is awaiting to hear from our office regarding request for prescription refill. Unable to comprehend on message medication needing refill

## 2023-05-01 ENCOUNTER — Other Ambulatory Visit: Payer: Self-pay | Admitting: Family Medicine

## 2023-05-01 DIAGNOSIS — R609 Edema, unspecified: Secondary | ICD-10-CM

## 2023-05-05 ENCOUNTER — Telehealth: Payer: Self-pay | Admitting: Neurology

## 2023-05-05 NOTE — Telephone Encounter (Signed)
Patient is returning phone call/KB

## 2023-05-07 ENCOUNTER — Other Ambulatory Visit: Payer: Self-pay

## 2023-05-07 ENCOUNTER — Telehealth: Payer: Self-pay | Admitting: Neurology

## 2023-05-07 DIAGNOSIS — R200 Anesthesia of skin: Secondary | ICD-10-CM

## 2023-05-07 DIAGNOSIS — M5416 Radiculopathy, lumbar region: Secondary | ICD-10-CM

## 2023-05-07 NOTE — Telephone Encounter (Signed)
Called patient and left a message letting him know that his MRI results were sent to his Mychart due to multiple calls and no answer, so he may get his results from the MyChart message or he may call us back. Left contact information if he had any questions or concerns.

## 2023-05-07 NOTE — Telephone Encounter (Signed)
Results to be given: Please let pt know that he does have narrowing in his lumbar spine which would explain his right leg numbness.  Recommend that he continue PT exercises.  Thanks.    Called patient again and left a message for a call back.

## 2023-05-07 NOTE — Telephone Encounter (Signed)
Patient returned call and was informed of results and recommendations. Patient did want a PT referral. I placed one to [facility] on [address]. Provided patietn with their information: [address] [place], [place] [address] Phone: [phone]. Asked patient to please allow them a few days to process his referral before he attempts to call them. Patient verbalized understanding.

## 2023-05-07 NOTE — Telephone Encounter (Signed)
Patient calling asking if he could get a phone call back concerning the MRI results. The message on mychart was not clear to him . Evlyn Clines

## 2023-05-17 ENCOUNTER — Other Ambulatory Visit: Payer: Self-pay | Admitting: Internal Medicine

## 2023-05-17 DIAGNOSIS — E785 Hyperlipidemia, unspecified: Secondary | ICD-10-CM

## 2023-05-27 ENCOUNTER — Ambulatory Visit: Payer: Medicare PPO | Attending: Neurology | Admitting: Physical Therapy

## 2023-05-28 NOTE — Therapy (Signed)
OUTPATIENT PHYSICAL THERAPY THORACOLUMBAR EVALUATION   Patient Name: Kristopher Gay MRN: [id number] DOB:Sep 02, 1951, 72 y.o., male Today's Date: 05/29/2023  END OF SESSION:  PT End of Session - 05/29/23 1103     Visit Number 1    Number of Visits 9    Date for PT Re-Evaluation 07/28/23   due to potential delay in scheduling   Authorization Type [employer]    PT Start Time 1019    PT Stop Time 1100    PT Time Calculation (min) 41 min    Activity Tolerance Patient tolerated treatment well    Behavior During Therapy WFL for tasks assessed/performed             Past Medical History:  Diagnosis Date   A-fib (HCC)    Allergic rhinitis, cause unspecified    Allergy    Anxiety    Blood clot in vein 2002   left calf SUPERFICIAL CLOT REMOVED THEN PART OF VEIN REMOVED   Chronic headaches    COPD (chronic obstructive pulmonary disease) (HCC)    Cough    X 1 YEAR NO FEVER CLEAR SPUTUM   DDD (degenerative disc disease)    Depression    DJD (degenerative joint disease)    ALL THE WAY DOWN SPINE   Dropfoot    LEFT , NO BRACE WORN NOW   Dysrhythmia    a fib one time after hernia surgery   Emphysema of lung (HCC)    Extrinsic asthma, unspecified    GERD (gastroesophageal reflux disease)    OCC NO MEDS FOR   Hypertension    STOPPED HTN MEDS UNTIL 2011, THEN HAD WEIGHT LOSS, NO MEDS SINCE   Neuromuscular disorder (HCC)    neuropathy  legs and feet   Neuropathy    POLYNEUOPATHOPATHY FEET AND LEGS   Peripheral vascular disease (HCC)    VARICOSE VEINS LEFT LEG   Pneumonia AS CHILD   PONV (postoperative nausea and vomiting)    Syncope    Ulnar nerve entrapment at elbow, right    Umbilical hernia    Unspecified asthma(493.90)    Past Surgical History:  Procedure Laterality Date   COLONSCOPY  06/16/2017   HERNIA REPAIR     INGUINAL HERNIA REPAIR Bilateral 07/20/2017   Procedure: LAPAROSCOPIC BILATERAL INGUINAL HERNIA REPAIR WITH MESH, UMBILICAL HERNIA REPAIR AND LYSIS OF  ADHESIONS;  Surgeon: [name], De Blanch, MD;  Location: [facility];  Service: General;  Laterality: Bilateral;   LAPAROSCOPY N/A 07/20/2017   Procedure: LAPAROSCOPY DIAGNOSTIC;  Surgeon: Rodman Pickle, MD;  Location: [facility];  Service: General;  Laterality: N/A;   QUADRICEPS TENDON REPAIR Right 08/07/2020   Procedure: REPAIR QUADRICEP TENDON;  Surgeon: Sheral Apley, MD;  Location: [facility];  Service: Orthopedics;  Laterality: Right;  NEED RAPID TEST;SAME DAY WORKUP; COMING FROM [facility]   QUADRICEPS TENDON REPAIR Right 02/26/2021   Procedure: REPAIR QUADRICEP TENDON;  Surgeon: Sheral Apley, MD;  Location: [facility];  Service: Orthopedics;  Laterality: Right;   ROTATOR CUFF REPAIR Left 2005   detached; left shoulder-reattached   SHOULDER SURGERY Right    laBRAL  tendon torn   SUPERFICIAL LEFT LEG VEIN STRIPPING WITH SMALL SUPERFICIAL CLOT  2002   ULNAR NERVE TRANSPOSITION Right 05/10/2018   Procedure: RIGHT ELBOW ULNAR NERVE RELEASE;  Surgeon: Bradly Bienenstock, MD;  Location: [facility];  Service: Orthopedics;  Laterality: Right;   UMBILICAL HERNIA REPAIR  02/18/2018   UMBILICAL HERNIA REPAIR N/A 02/18/2018  Procedure: LAPAROSCOPIC UMBILICAL HERNIA REPAIR ERAS PATHWAY;  Surgeon: [name], De Blanch, MD;  Location: [facility];  Service: General;  Laterality: N/A;   Patient Active Problem List   Diagnosis Date Noted   Rupture of quadriceps tendon, right, sequela 02/26/2021   Rupture of right quadriceps tendon 08/07/2020   Ulnar neuropathy at elbow, right 05/03/2018   PAF (paroxysmal atrial fibrillation) (HCC) 02/20/2018   Chronic right shoulder pain 05/15/2016   Right knee pain 05/15/2016   Adrenal adenoma 02/20/2016   Chronic pain syndrome 10/12/2015   Dysphagia, pharyngoesophageal phase 09/14/2014   Other chronic postoperative pain 01/16/2014   Allergy, insect bite 07/02/2012   GERD (gastroesophageal reflux disease) 03/25/2012   Injury to peroneal nerve 03/12/2012    Lumbosacral spondylosis 01/13/2012   Chronic back pain 11/17/2011   Seasonal and perennial allergic rhinitis 12/26/2010    ONSET DATE: 05/07/2023   REFERRING DIAG: M54.16 (ICD-10-CM) - Lumbar radiculopathy R20.0 (ICD-10-CM) - Bilateral leg numbness  PCP: No PCP  REFERRING PROVIDER: Glendale Chard, DO   Rationale for Evaluation and Treatment: Rehabilitation  THERAPY DIAG:  Other abnormalities of gait and mobility  Unsteadiness on feet  Pain of right lower extremity  Other disturbances of skin sensation  History of falling  ONSET DATE: 05/07/2023  SUBJECTIVE:                                                                                                                                                                                           SUBJECTIVE STATEMENT: In September of 2021, had an injury to his R knee and tore his quadricep and had a R quadriceps tendon repair. In 2022, had another quadriceps tendon repair with use of a graft. During that time was not really moving his leg. Has a lot of numbness in that leg. Has continued pain in R leg. Saw a neurologist due to numbness, pain, and discoloration in leg. Reports sometimes right leg won't really have feeling in it. Pt got an MRI of his lumbar spine which showed some narrowing in his lumbar spine. Does some exercises at the gym that releases his back, but does not do anything for his lower leg. Reports numbness is in leg all the time. When laying down in bed, can start to feel more in his foot. Wears a sleeve on his R knee. Can't walk down a slope as it feels like it will throw him forwards. Balance can feel off at times. Uses a cane   PERTINENT HISTORY:  PMH: A-fib, anxiety, chronic headaches, COPD, emphysema, HTN, Longstanding history of idiopathic neuropathy , peripheral vascular disease, hx of R quadriceps tendon repair  in 2021/2022  Asymmetric right leg numbness maybe due to right lumbar radiculopathy.   PAIN:  Are  you having pain? Yes: NPRS scale: 6/10 for knee pain, 6/10 back pain  Pain location: R side of back, R knee, numbness down R leg  Pain description: Numbness  Aggravating factors: Walking too far or getting around the house  Relieving factors: Exercises that he does at the gym that help his back   PRECAUTIONS: Fall   WEIGHT BEARING RESTRICTIONS: No  FALLS:  Has patient fallen in last 6 months? Yes. Number of falls 1 or 2 falls, can't recall how he fell, but knows it happens in the yard, can't remember the details   LIVING ENVIRONMENT:  Has following equipment at home: Single point cane, Walker - 2 wheeled, and doesn't use RW as much, but it is there.   PLOF: Independent  PATIENT GOALS: Wants to improve the numbness in his legs   NEXT MD VISIT: Jacalyn Lefevre, Phys Med on 08/18/23   OBJECTIVE:   DIAGNOSTIC FINDINGS:  MRI lumbar spine: 04/12/23: IMPRESSION: 1. No acute findings or clear explanation for the patient's symptoms. 2. Chronic multilevel spondylosis associated with a convex right scoliosis, only minimally progressive from previous MRI from 2008. 3. Chronic left foraminal narrowing at L3-4 with probable chronic left L3 nerve root encroachment. 4. Chronic right-greater-than-left foraminal narrowing at L4-5 and L5-S1.  PATIENT SURVEYS:  Modified Oswestry Will provide at next session    COGNITION: Overall cognitive status: Within functional limits for tasks assessed     SENSATION: Light touch: Impaired  and due to neuropathy, more difficult to tell with RLE more distally, numbness down RLE Proprioception: WFL and able to detect at bilat ankles in PF/DF, but when tested on R foot, pt reports that it feels numb or an abnormal pain that it has to describe    POSTURE: rounded shoulders and posterior pelvic tilt Trunk flexed during gait    LOWER EXTREMITY ROM:     Limited R knee extension AROM due to hx of R quad tendon repairs   Active  Right eval Left eval   Hip flexion    Hip extension    Hip abduction    Hip adduction    Hip internal rotation    Hip external rotation    Knee flexion Limited, can only do about 90-95 degrees of knee flexion due to hx of quad tendon repairs   Knee extension    Ankle dorsiflexion    Ankle plantarflexion    Ankle inversion    Ankle eversion     (Blank rows = not tested)  LOWER EXTREMITY MMT:    MMT Right eval Left eval  Hip flexion 3+ 4-  Hip extension    Hip abduction    Hip adduction    Hip internal rotation    Hip external rotation    Knee flexion 4 4+  Knee extension 3- (pain in the knee) 4-  Ankle dorsiflexion 4 (pt reports incr pain) 5  Ankle plantarflexion    Ankle inversion    Ankle eversion     (Blank rows = not tested)  GAIT: Gait pattern: decreased stance time- Right, knee flexed in stance- Right, antalgic, trunk flexed, and wide BOS Distance walked: Clinic distances  Assistive device utilized: Single point cane Level of assistance: SBA    TODAY'S TREATMENT:  N/A during eval    PATIENT EDUCATION:  Education details: Clinical findings, POC, explained that PT can't treat pt's numbness, but PT can provide some stretches/exercises for pt's low back and for strengthening his legs. Began to explain general lumbar MRI findings. Ran out of time during eval to answer all of pt's questions.  Person educated: Patient Education method: Explanation Education comprehension: verbalized understanding and needs further education  HOME EXERCISE PROGRAM: Will provide at future session   ASSESSMENT:  CLINICAL IMPRESSION: Patient is a 72 year old male referred to Neuro OPPT for lumbar radiculopathy and bilateral leg numbness.   Pt's PMH is significant for: A-fib, anxiety, chronic headaches, COPD, emphysema, HTN, Longstanding history of idiopathic neuropathy , peripheral  vascular disease, hx of R quadriceps tendon repair in 2021/2022. The following deficits were present during the exam: decr RLE knee AROM, decr strength, gait abnormalities, impaired sensation, BLE numbness R>L, postural abnormalities, RLE and back pain. Limited assessment during eval due to pt's subjective report and education. Will perform further assessment at next session. Pt would benefit from skilled PT to address these impairments and functional limitations to maximize functional mobility independence   OBJECTIVE IMPAIRMENTS: Abnormal gait, decreased activity tolerance, decreased balance, decreased endurance, decreased knowledge of use of DME, decreased mobility, difficulty walking, decreased ROM, decreased strength, hypomobility, impaired sensation, postural dysfunction, and pain.   ACTIVITY LIMITATIONS: standing, squatting, stairs, transfers, and locomotion level  PARTICIPATION LIMITATIONS: community activity  PERSONAL FACTORS: Age, Behavior pattern, Past/current experiences, Time since onset of injury/illness/exacerbation, and 3+ comorbidities:  A-fib, anxiety, chronic headaches, COPD, emphysema, HTN, Longstanding history of idiopathic neuropathy , peripheral vascular disease, hx of R quadriceps tendon repair in 2021/2022  are also affecting patient's functional outcome.   REHAB POTENTIAL: Good  CLINICAL DECISION MAKING: Evolving/moderate complexity  EVALUATION COMPLEXITY: Moderate   GOALS: Goals reviewed with patient? Yes  SHORT TERM GOALS: ALL STGS = LTGS  LONG TERM GOALS: Target date: 06/26/2023  Pt will be independent with final HEP in order to build upon functional gains made in therapy. Baseline:  Goal status: INITIAL  2.  ODI to be assessed with goal written.  Baseline:  Goal status: INITIAL  3.  Gait speed to be assessed with goal written.  Baseline:  Goal status: INITIAL  4.  5x sit <> stand to be assessed with goal written.  Baseline:  Goal status:  INITIAL    PLAN:  PT FREQUENCY: 2x/week  PT DURATION: 8 weeks   PLANNED INTERVENTIONS: Therapeutic exercises, Therapeutic activity, Neuromuscular re-education, Balance training, Gait training, Patient/Family education, Self Care, Joint mobilization, Orthotic/Fit training, DME instructions, Spinal mobilization, Manual therapy, and Re-evaluation.  PLAN FOR NEXT SESSION: Have pt fill out ODI, perform further assessment of low back. Give appropriate HEP for low back stretches and any strengthening exercises. Assess 5x sit <> stand and gait speed and write goal.    Drake Leach, PT ,DPT 05/29/2023, 11:05 AM

## 2023-05-29 ENCOUNTER — Ambulatory Visit: Payer: Medicare PPO | Attending: Neurology | Admitting: Physical Therapy

## 2023-05-29 ENCOUNTER — Encounter: Payer: Self-pay | Admitting: Physical Therapy

## 2023-05-29 DIAGNOSIS — R2681 Unsteadiness on feet: Secondary | ICD-10-CM | POA: Insufficient documentation

## 2023-05-29 DIAGNOSIS — R208 Other disturbances of skin sensation: Secondary | ICD-10-CM | POA: Diagnosis not present

## 2023-05-29 DIAGNOSIS — R200 Anesthesia of skin: Secondary | ICD-10-CM | POA: Diagnosis not present

## 2023-05-29 DIAGNOSIS — M79604 Pain in right leg: Secondary | ICD-10-CM | POA: Insufficient documentation

## 2023-05-29 DIAGNOSIS — M5416 Radiculopathy, lumbar region: Secondary | ICD-10-CM | POA: Diagnosis not present

## 2023-05-29 DIAGNOSIS — Z9181 History of falling: Secondary | ICD-10-CM | POA: Insufficient documentation

## 2023-05-29 DIAGNOSIS — R2689 Other abnormalities of gait and mobility: Secondary | ICD-10-CM | POA: Diagnosis not present

## 2023-06-02 ENCOUNTER — Ambulatory Visit: Payer: Medicare PPO

## 2023-06-02 DIAGNOSIS — R2681 Unsteadiness on feet: Secondary | ICD-10-CM | POA: Diagnosis not present

## 2023-06-02 DIAGNOSIS — M79604 Pain in right leg: Secondary | ICD-10-CM

## 2023-06-02 DIAGNOSIS — R208 Other disturbances of skin sensation: Secondary | ICD-10-CM

## 2023-06-02 DIAGNOSIS — M5416 Radiculopathy, lumbar region: Secondary | ICD-10-CM | POA: Diagnosis not present

## 2023-06-02 DIAGNOSIS — R2689 Other abnormalities of gait and mobility: Secondary | ICD-10-CM

## 2023-06-02 DIAGNOSIS — Z9181 History of falling: Secondary | ICD-10-CM

## 2023-06-02 DIAGNOSIS — R200 Anesthesia of skin: Secondary | ICD-10-CM | POA: Diagnosis not present

## 2023-06-02 NOTE — Therapy (Signed)
OUTPATIENT PHYSICAL THERAPY TREATMENT NOTE   Patient Name: Kristopher Gay MRN: [id number] DOB:09-22-1951, 72 y.o., male Today's Date: 06/02/2023  END OF SESSION:  PT End of Session - 06/02/23 1120     Visit Number 2    Number of Visits 9    Date for PT Re-Evaluation 07/28/23   due to potential delay in scheduling   Authorization Type [employer]    PT Start Time 1110    PT Stop Time 1155    PT Time Calculation (min) 45 min    Activity Tolerance Patient tolerated treatment well    Behavior During Therapy WFL for tasks assessed/performed             Past Medical History:  Diagnosis Date   A-fib (HCC)    Allergic rhinitis, cause unspecified    Allergy    Anxiety    Blood clot in vein 2002   left calf SUPERFICIAL CLOT REMOVED THEN PART OF VEIN REMOVED   Chronic headaches    COPD (chronic obstructive pulmonary disease) (HCC)    Cough    X 1 YEAR NO FEVER CLEAR SPUTUM   DDD (degenerative disc disease)    Depression    DJD (degenerative joint disease)    ALL THE WAY DOWN SPINE   Dropfoot    LEFT , NO BRACE WORN NOW   Dysrhythmia    a fib one time after hernia surgery   Emphysema of lung (HCC)    Extrinsic asthma, unspecified    GERD (gastroesophageal reflux disease)    OCC NO MEDS FOR   Hypertension    STOPPED HTN MEDS UNTIL 2011, THEN HAD WEIGHT LOSS, NO MEDS SINCE   Neuromuscular disorder (HCC)    neuropathy  legs and feet   Neuropathy    POLYNEUOPATHOPATHY FEET AND LEGS   Peripheral vascular disease (HCC)    VARICOSE VEINS LEFT LEG   Pneumonia AS CHILD   PONV (postoperative nausea and vomiting)    Syncope    Ulnar nerve entrapment at elbow, right    Umbilical hernia    Unspecified asthma(493.90)    Past Surgical History:  Procedure Laterality Date   COLONSCOPY  06/16/2017   HERNIA REPAIR     INGUINAL HERNIA REPAIR Bilateral 07/20/2017   Procedure: LAPAROSCOPIC BILATERAL INGUINAL HERNIA REPAIR WITH MESH, UMBILICAL HERNIA REPAIR AND LYSIS OF ADHESIONS;   Surgeon: [name], De Blanch, MD;  Location: [facility];  Service: General;  Laterality: Bilateral;   LAPAROSCOPY N/A 07/20/2017   Procedure: LAPAROSCOPY DIAGNOSTIC;  Surgeon: Rodman Pickle, MD;  Location: [facility];  Service: General;  Laterality: N/A;   QUADRICEPS TENDON REPAIR Right 08/07/2020   Procedure: REPAIR QUADRICEP TENDON;  Surgeon: Sheral Apley, MD;  Location: [facility];  Service: Orthopedics;  Laterality: Right;  NEED RAPID TEST;SAME DAY WORKUP; COMING FROM [facility]   QUADRICEPS TENDON REPAIR Right 02/26/2021   Procedure: REPAIR QUADRICEP TENDON;  Surgeon: Sheral Apley, MD;  Location: [facility];  Service: Orthopedics;  Laterality: Right;   ROTATOR CUFF REPAIR Left 2005   detached; left shoulder-reattached   SHOULDER SURGERY Right    laBRAL  tendon torn   SUPERFICIAL LEFT LEG VEIN STRIPPING WITH SMALL SUPERFICIAL CLOT  2002   ULNAR NERVE TRANSPOSITION Right 05/10/2018   Procedure: RIGHT ELBOW ULNAR NERVE RELEASE;  Surgeon: Bradly Bienenstock, MD;  Location: [facility];  Service: Orthopedics;  Laterality: Right;   UMBILICAL HERNIA REPAIR  02/18/2018   UMBILICAL HERNIA REPAIR N/A 02/18/2018  Procedure: LAPAROSCOPIC UMBILICAL HERNIA REPAIR ERAS PATHWAY;  Surgeon: [name], De Blanch, MD;  Location: [facility];  Service: General;  Laterality: N/A;   Patient Active Problem List   Diagnosis Date Noted   Rupture of quadriceps tendon, right, sequela 02/26/2021   Rupture of right quadriceps tendon 08/07/2020   Ulnar neuropathy at elbow, right 05/03/2018   PAF (paroxysmal atrial fibrillation) (HCC) 02/20/2018   Chronic right shoulder pain 05/15/2016   Right knee pain 05/15/2016   Adrenal adenoma 02/20/2016   Chronic pain syndrome 10/12/2015   Dysphagia, pharyngoesophageal phase 09/14/2014   Other chronic postoperative pain 01/16/2014   Allergy, insect bite 07/02/2012   GERD (gastroesophageal reflux disease) 03/25/2012   Injury to peroneal nerve 03/12/2012   Lumbosacral  spondylosis 01/13/2012   Chronic back pain 11/17/2011   Seasonal and perennial allergic rhinitis 12/26/2010    ONSET DATE: 05/07/2023   REFERRING DIAG: M54.16 (ICD-10-CM) - Lumbar radiculopathy R20.0 (ICD-10-CM) - Bilateral leg numbness  PCP: No PCP  REFERRING PROVIDER: Glendale Chard, DO   Rationale for Evaluation and Treatment: Rehabilitation  THERAPY DIAG:  Other abnormalities of gait and mobility  Unsteadiness on feet  Pain of right lower extremity  Other disturbances of skin sensation  History of falling  ONSET DATE: 05/07/2023  SUBJECTIVE:                                                                                                                                                                                           SUBJECTIVE STATEMENT: Reviewed history again. Pt reports he has polyneuropathy. Pt reports neuropathy in R LE got worst after R knee injury. He had pre existing neuropathy but feels that it got more painful afterwards. Pt wants to also work on his lower back pain as he feels stiff and painful.   PERTINENT HISTORY:  PMH: A-fib, anxiety, chronic headaches, COPD, emphysema, HTN, Longstanding history of idiopathic neuropathy , peripheral vascular disease, hx of R quadriceps tendon repair in 2021/2022  Asymmetric right leg numbness maybe due to right lumbar radiculopathy.   PAIN:  Are you having pain? Yes: NPRS scale: 6/10 for knee pain, 6/10 back pain  Pain location: R side of back, R knee, numbness down R leg  Pain description: Numbness  Aggravating factors: Walking too far or getting around the house  Relieving factors: Exercises that he does at the gym that help his back   PRECAUTIONS: Fall   WEIGHT BEARING RESTRICTIONS: No  FALLS:  Has patient fallen in last 6 months? Yes. Number of falls 1 or 2 falls, can't recall how he fell, but knows it happens in the yard, can't remember  the details   LIVING ENVIRONMENT:  Has following equipment  at home: Single point cane, Walker - 2 wheeled, and doesn't use RW as much, but it is there.   PLOF: Independent  PATIENT GOALS: Wants to improve the numbness in his legs   NEXT MD VISIT: Jacalyn Lefevre, Phys Med on 08/18/23   OBJECTIVE:   DIAGNOSTIC FINDINGS:  MRI lumbar spine: 04/12/23: IMPRESSION: 1. No acute findings or clear explanation for the patient's symptoms. 2. Chronic multilevel spondylosis associated with a convex right scoliosis, only minimally progressive from previous MRI from 2008. 3. Chronic left foraminal narrowing at L3-4 with probable chronic left L3 nerve root encroachment. 4. Chronic right-greater-than-left foraminal narrowing at L4-5 and L5-S1.  PATIENT SURVEYS:  Modified Oswestry Will provide at next session    COGNITION: Overall cognitive status: Within functional limits for tasks assessed     SENSATION: Light touch: Impaired  and due to neuropathy, more difficult to tell with RLE more distally, numbness down RLE Proprioception: WFL and able to detect at bilat ankles in PF/DF, but when tested on R foot, pt reports that it feels numb or an abnormal pain that it has to describe    POSTURE: rounded shoulders and posterior pelvic tilt Trunk flexed during gait    LOWER EXTREMITY ROM:     Limited R knee extension AROM due to hx of R quad tendon repairs   Active  Right eval Left eval Right 06/02/23 Active/passive  Hip flexion     Hip extension     Hip abduction     Hip adduction     Hip internal rotation     Hip external rotation     Knee flexion Limited, can only do about 90-95 degrees of knee flexion due to hx of quad tendon repairs  115/120  Knee extension   -25/0  Ankle dorsiflexion     Ankle plantarflexion     Ankle inversion     Ankle eversion      (Blank rows = not tested)  LOWER EXTREMITY MMT:    MMT Right eval Left eval  Hip flexion 3+ 4-  Hip extension    Hip abduction    Hip adduction    Hip internal rotation    Hip  external rotation    Knee flexion 4 4+  Knee extension 3- (pain in the knee) 4-  Ankle dorsiflexion 4 (pt reports incr pain) 5  Ankle plantarflexion    Ankle inversion    Ankle eversion     (Blank rows = not tested)  GAIT: Gait pattern: decreased stance time- Right, knee flexed in stance- Right, antalgic, trunk flexed, and wide BOS Distance walked: Clinic distances  Assistive device utilized: Single point cane Level of assistance: SBA    TODAY'S TREATMENT:  Pt has 25 deg extension lag in L knee Reviewed patient's exercise program at the gym. Foam roll massage to R quads with patient in supine position. Pt reported improved feeling in his R foot and R lower leg after foam roll massage to R quads- pt educated that foam roll massage was just to improve knee flexibility but if neuropathy feels better, it is unexpected positive outcome We discussed trial of NMES for improved quad control in R quads, contraindications reviewed with patient. NMES: [place] unit: 23mA-53mA, 10 sec on 30 sec off, AA SAQ performed in supine for 15' total of treatment time. Supine knee extension lag improved to 20 deg (by 5 deg improvement) Supine knee flexion improved to 120 deg (Active = passive, 5 deg improvement in knee AROM)   PATIENT EDUCATION:  Education details: Clinical findings, POC, explained that PT can't treat pt's numbness, but PT can provide some stretches/exercises for pt's low back and for strengthening his legs. Began to explain general lumbar MRI findings. Ran out of time during eval to answer all of pt's questions.  Person educated: Patient Education method: Explanation Education comprehension: verbalized understanding and needs further education  HOME EXERCISE PROGRAM: Will provide at future session   ASSESSMENT:  CLINICAL IMPRESSION:  Pt tolerated session  well. Reported mild improvement in his neuropathy in R LE with foam roll massage and use of NMES. Pt will continue to benefit from trial of NMES to improve quad strength and knee stability, especially with stairs and ramp negotiations   OBJECTIVE IMPAIRMENTS: Abnormal gait, decreased activity tolerance, decreased balance, decreased endurance, decreased knowledge of use of DME, decreased mobility, difficulty walking, decreased ROM, decreased strength, hypomobility, impaired sensation, postural dysfunction, and pain.   ACTIVITY LIMITATIONS: standing, squatting, stairs, transfers, and locomotion level  PARTICIPATION LIMITATIONS: community activity  PERSONAL FACTORS: Age, Behavior pattern, Past/current experiences, Time since onset of injury/illness/exacerbation, and 3+ comorbidities:  A-fib, anxiety, chronic headaches, COPD, emphysema, HTN, Longstanding history of idiopathic neuropathy , peripheral vascular disease, hx of R quadriceps tendon repair in 2021/2022  are also affecting patient's functional outcome.   REHAB POTENTIAL: Good  CLINICAL DECISION MAKING: Evolving/moderate complexity  EVALUATION COMPLEXITY: Moderate   GOALS: Goals reviewed with patient? Yes  SHORT TERM GOALS: ALL STGS = LTGS  LONG TERM GOALS: Target date: 06/26/2023  Pt will be independent with final HEP in order to build upon functional gains made in therapy. Baseline:  Goal status: INITIAL  2.  ODI to be assessed with goal written.  Baseline:  Goal status: INITIAL  3.  Gait speed to be assessed with goal written.  Baseline:  Goal status: INITIAL  4.  5x sit <> stand to be assessed with goal written.  Baseline:  Goal status: INITIAL    PLAN:  PT FREQUENCY: 2x/week  PT DURATION: 8 weeks   PLANNED INTERVENTIONS: Therapeutic exercises, Therapeutic activity, Neuromuscular re-education, Balance training, Gait training, Patient/Family education, Self Care, Joint mobilization, Stair training, Orthotic/Fit  training, DME instructions, Aquatic Therapy, Electrical stimulation, Spinal mobilization, Taping, Manual therapy, and Re-evaluation.  PLAN FOR NEXT SESSION: Have pt fill out ODI, perform further assessment of low back. Give appropriate HEP for low back stretches and any strengthening exercises. Assess 5x sit <> stand and gait speed and write goal.  Continue with NMES for improved quad control.   Ileana Ladd, PT ,DPT 06/02/2023, 12:25 PM

## 2023-06-05 ENCOUNTER — Encounter: Payer: Self-pay | Admitting: Physical Therapy

## 2023-06-05 ENCOUNTER — Ambulatory Visit: Payer: Medicare PPO | Admitting: Physical Therapy

## 2023-06-05 DIAGNOSIS — R2681 Unsteadiness on feet: Secondary | ICD-10-CM

## 2023-06-05 DIAGNOSIS — R208 Other disturbances of skin sensation: Secondary | ICD-10-CM

## 2023-06-05 DIAGNOSIS — R2689 Other abnormalities of gait and mobility: Secondary | ICD-10-CM

## 2023-06-05 DIAGNOSIS — R200 Anesthesia of skin: Secondary | ICD-10-CM | POA: Diagnosis not present

## 2023-06-05 DIAGNOSIS — M79604 Pain in right leg: Secondary | ICD-10-CM | POA: Diagnosis not present

## 2023-06-05 DIAGNOSIS — Z9181 History of falling: Secondary | ICD-10-CM

## 2023-06-05 DIAGNOSIS — M5416 Radiculopathy, lumbar region: Secondary | ICD-10-CM | POA: Diagnosis not present

## 2023-06-05 NOTE — Therapy (Signed)
OUTPATIENT PHYSICAL THERAPY TREATMENT NOTE   Patient Name: Kristopher Gay MRN: [id number] DOB:1951/10/09, 72 y.o., male Today's Date: 06/05/2023  END OF SESSION:  PT End of Session - 06/05/23 1024     Visit Number 3    Number of Visits 9    Date for PT Re-Evaluation 07/28/23    Authorization Type [employer]    PT Start Time 0933    PT Stop Time 1015    PT Time Calculation (min) 42 min    Activity Tolerance Patient tolerated treatment well    Behavior During Therapy WFL for tasks assessed/performed              Past Medical History:  Diagnosis Date   A-fib (HCC)    Allergic rhinitis, cause unspecified    Allergy    Anxiety    Blood clot in vein 2002   left calf SUPERFICIAL CLOT REMOVED THEN PART OF VEIN REMOVED   Chronic headaches    COPD (chronic obstructive pulmonary disease) (HCC)    Cough    X 1 YEAR NO FEVER CLEAR SPUTUM   DDD (degenerative disc disease)    Depression    DJD (degenerative joint disease)    ALL THE WAY DOWN SPINE   Dropfoot    LEFT , NO BRACE WORN NOW   Dysrhythmia    a fib one time after hernia surgery   Emphysema of lung (HCC)    Extrinsic asthma, unspecified    GERD (gastroesophageal reflux disease)    OCC NO MEDS FOR   Hypertension    STOPPED HTN MEDS UNTIL 2011, THEN HAD WEIGHT LOSS, NO MEDS SINCE   Neuromuscular disorder (HCC)    neuropathy  legs and feet   Neuropathy    POLYNEUOPATHOPATHY FEET AND LEGS   Peripheral vascular disease (HCC)    VARICOSE VEINS LEFT LEG   Pneumonia AS CHILD   PONV (postoperative nausea and vomiting)    Syncope    Ulnar nerve entrapment at elbow, right    Umbilical hernia    Unspecified asthma(493.90)    Past Surgical History:  Procedure Laterality Date   COLONSCOPY  06/16/2017   HERNIA REPAIR     INGUINAL HERNIA REPAIR Bilateral 07/20/2017   Procedure: LAPAROSCOPIC BILATERAL INGUINAL HERNIA REPAIR WITH MESH, UMBILICAL HERNIA REPAIR AND LYSIS OF ADHESIONS;  Surgeon: [name], De Blanch, MD;   Location: [facility];  Service: General;  Laterality: Bilateral;   LAPAROSCOPY N/A 07/20/2017   Procedure: LAPAROSCOPY DIAGNOSTIC;  Surgeon: Rodman Pickle, MD;  Location: [facility];  Service: General;  Laterality: N/A;   QUADRICEPS TENDON REPAIR Right 08/07/2020   Procedure: REPAIR QUADRICEP TENDON;  Surgeon: Sheral Apley, MD;  Location: [facility];  Service: Orthopedics;  Laterality: Right;  NEED RAPID TEST;SAME DAY WORKUP; COMING FROM [facility]   QUADRICEPS TENDON REPAIR Right 02/26/2021   Procedure: REPAIR QUADRICEP TENDON;  Surgeon: Sheral Apley, MD;  Location: [facility];  Service: Orthopedics;  Laterality: Right;   ROTATOR CUFF REPAIR Left 2005   detached; left shoulder-reattached   SHOULDER SURGERY Right    laBRAL  tendon torn   SUPERFICIAL LEFT LEG VEIN STRIPPING WITH SMALL SUPERFICIAL CLOT  2002   ULNAR NERVE TRANSPOSITION Right 05/10/2018   Procedure: RIGHT ELBOW ULNAR NERVE RELEASE;  Surgeon: Bradly Bienenstock, MD;  Location: [facility];  Service: Orthopedics;  Laterality: Right;   UMBILICAL HERNIA REPAIR  02/18/2018   UMBILICAL HERNIA REPAIR N/A 02/18/2018   Procedure: LAPAROSCOPIC UMBILICAL HERNIA REPAIR  ERAS PATHWAY;  Surgeon: [name], De Blanch, MD;  Location: [facility];  Service: General;  Laterality: N/A;   Patient Active Problem List   Diagnosis Date Noted   Rupture of quadriceps tendon, right, sequela 02/26/2021   Rupture of right quadriceps tendon 08/07/2020   Ulnar neuropathy at elbow, right 05/03/2018   PAF (paroxysmal atrial fibrillation) (HCC) 02/20/2018   Chronic right shoulder pain 05/15/2016   Right knee pain 05/15/2016   Adrenal adenoma 02/20/2016   Chronic pain syndrome 10/12/2015   Dysphagia, pharyngoesophageal phase 09/14/2014   Other chronic postoperative pain 01/16/2014   Allergy, insect bite 07/02/2012   GERD (gastroesophageal reflux disease) 03/25/2012   Injury to peroneal nerve 03/12/2012   Lumbosacral spondylosis 01/13/2012   Chronic back  pain 11/17/2011   Seasonal and perennial allergic rhinitis 12/26/2010    ONSET DATE: 05/07/2023   REFERRING DIAG: M54.16 (ICD-10-CM) - Lumbar radiculopathy R20.0 (ICD-10-CM) - Bilateral leg numbness  PCP: No PCP  REFERRING PROVIDER: Glendale Chard, DO   Rationale for Evaluation and Treatment: Rehabilitation  THERAPY DIAG:  Other abnormalities of gait and mobility  Unsteadiness on feet  Pain of right lower extremity  Other disturbances of skin sensation  History of falling  ONSET DATE: 05/07/2023  SUBJECTIVE:                                                                                                                                                                                           SUBJECTIVE STATEMENT:  Nothing really new since last session, I would really like to work on my back as its weak, it tires me out to walk and its sore all the time. I've been real tired the last week or so I think from my back.   PERTINENT HISTORY:  PMH: A-fib, anxiety, chronic headaches, COPD, emphysema, HTN, Longstanding history of idiopathic neuropathy , peripheral vascular disease, hx of R quadriceps tendon repair in 2021/2022  Asymmetric right leg numbness maybe due to right lumbar radiculopathy.   PAIN:  Are you having pain? Yes: NPRS scale: 5/10 for knee pain, 7/10 back pain  Pain location: R side of back, R knee, numbness down R leg  Pain description: Numbness, back is very tiring  Aggravating factors: Walking too far or getting around the house  Relieving factors: Exercises that he does at the gym that help his back   PRECAUTIONS: Fall   WEIGHT BEARING RESTRICTIONS: No  FALLS:  Has patient fallen in last 6 months? Yes. Number of falls 1 or 2 falls, can't recall how he fell, but knows it happens in the yard, can't remember the details   LIVING  ENVIRONMENT:  Has following equipment at home: Single point cane, Walker - 2 wheeled, and doesn't use RW as much, but it is  there.   PLOF: Independent  PATIENT GOALS: Wants to improve the numbness in his legs   NEXT MD VISIT: Jacalyn Lefevre, Phys Med on 08/18/23   OBJECTIVE:   DIAGNOSTIC FINDINGS:  MRI lumbar spine: 04/12/23: IMPRESSION: 1. No acute findings or clear explanation for the patient's symptoms. 2. Chronic multilevel spondylosis associated with a convex right scoliosis, only minimally progressive from previous MRI from 2008. 3. Chronic left foraminal narrowing at L3-4 with probable chronic left L3 nerve root encroachment. 4. Chronic right-greater-than-left foraminal narrowing at L4-5 and L5-S1.  PATIENT SURVEYS:  Modified Oswestry Will provide at next session    COGNITION: Overall cognitive status: Within functional limits for tasks assessed     SENSATION: Light touch: Impaired  and due to neuropathy, more difficult to tell with RLE more distally, numbness down RLE Proprioception: WFL and able to detect at bilat ankles in PF/DF, but when tested on R foot, pt reports that it feels numb or an abnormal pain that it has to describe    POSTURE: rounded shoulders and posterior pelvic tilt Trunk flexed during gait    LOWER EXTREMITY ROM:     Limited R knee extension AROM due to hx of R quad tendon repairs   Active  Right eval Left eval Right 06/02/23 Active/passive  Hip flexion     Hip extension     Hip abduction     Hip adduction     Hip internal rotation     Hip external rotation     Knee flexion Limited, can only do about 90-95 degrees of knee flexion due to hx of quad tendon repairs  115/120  Knee extension   -25/0  Ankle dorsiflexion     Ankle plantarflexion     Ankle inversion     Ankle eversion      (Blank rows = not tested)  LOWER EXTREMITY MMT:    MMT Right eval Left eval  Hip flexion 3+ 4-  Hip extension    Hip abduction    Hip adduction    Hip internal rotation    Hip external rotation    Knee flexion 4 4+  Knee extension 3- (pain in the knee) 4-  Ankle  dorsiflexion 4 (pt reports incr pain) 5  Ankle plantarflexion    Ankle inversion    Ankle eversion     (Blank rows = not tested)  GAIT: Gait pattern: decreased stance time- Right, knee flexed in stance- Right, antalgic, trunk flexed, and wide BOS Distance walked: Clinic distances  Assistive device utilized: Single point cane Level of assistance: SBA    TODAY'S TREATMENT:                                                                                                                               06/05/23  TherEx  Tried lumbar rotations- painful so stopped SKTC using towel 6x5 seconds B  Bridges x5 PPT with cues for pelvic clock x12 Sidelying clams x12 B red TB LAQs red TB x12 B, has been working on this at home with weights  Modified QL stretch 5x3 second holds B Hip hikes x10 B  Seated TA set + march x10 B      PATIENT EDUCATION:  Education details: Clinical findings, POC, explained that PT can't treat pt's numbness, but PT can provide some stretches/exercises for pt's low back and for strengthening his legs. Began to explain general lumbar MRI findings. Ran out of time during eval to answer all of pt's questions.  Person educated: Patient Education method: Explanation Education comprehension: verbalized understanding and needs further education  HOME EXERCISE PROGRAM: Access Code: [email or web address] URL: [email or web address] Date: 06/05/2023 Prepared by: Nedra Hai  Exercises - Supine Single Knee to Chest Stretch  - 2 x daily - 7 x weekly - 1 sets - 5 reps - 5-10 seconds  hold - Supine Bridge  - 1-2 x daily - 7 x weekly - 1 sets - 5-10 reps - 1 second hold - Supine Figure 4 Piriformis Stretch  - 2 x daily - 7 x weekly - 1 sets - 3 reps - 30 hold - Supine Posterior Pelvic Tilt  - 1-2 x daily - 7 x weekly - 1 sets - 10 reps - 2-3 seconds  hold   ASSESSMENT:  CLINICAL IMPRESSION:   Amarian arrives today doing OK, tells me he would really like to focus on  his back pain this morning. Worked on functional stretches and strengthening as appropriate, also built initial HEP for him today as well and hopeful that it will assist in pain management at home. Endorses a wound medial ankle that is infected, has not seen MD but declined having PT examine it today.    OBJECTIVE IMPAIRMENTS: Abnormal gait, decreased activity tolerance, decreased balance, decreased endurance, decreased knowledge of use of DME, decreased mobility, difficulty walking, decreased ROM, decreased strength, hypomobility, impaired sensation, postural dysfunction, and pain.   ACTIVITY LIMITATIONS: standing, squatting, stairs, transfers, and locomotion level  PARTICIPATION LIMITATIONS: community activity  PERSONAL FACTORS: Age, Behavior pattern, Past/current experiences, Time since onset of injury/illness/exacerbation, and 3+ comorbidities:  A-fib, anxiety, chronic headaches, COPD, emphysema, HTN, Longstanding history of idiopathic neuropathy , peripheral vascular disease, hx of R quadriceps tendon repair in 2021/2022  are also affecting patient's functional outcome.   REHAB POTENTIAL: Good  CLINICAL DECISION MAKING: Evolving/moderate complexity  EVALUATION COMPLEXITY: Moderate   GOALS: Goals reviewed with patient? Yes  SHORT TERM GOALS: ALL STGS = LTGS  LONG TERM GOALS: Target date: 06/26/2023  Pt will be independent with final HEP in order to build upon functional gains made in therapy. Baseline:  Goal status: INITIAL  2.  ODI to be assessed with goal written.  Baseline:  Goal status: INITIAL  3.  Gait speed to be assessed with goal written.  Baseline:  Goal status: INITIAL  4.  5x sit <> stand to be assessed with goal written.  Baseline:  Goal status: INITIAL    PLAN:  PT FREQUENCY: 2x/week  PT DURATION: 8 weeks   PLANNED INTERVENTIONS: Therapeutic exercises, Therapeutic activity, Neuromuscular re-education, Balance training, Gait training, Patient/Family  education, Self Care, Joint mobilization, Stair training, Orthotic/Fit training, DME instructions, Aquatic Therapy, Electrical stimulation, Spinal mobilization, Taping, Manual therapy, and Re-evaluation.  PLAN FOR NEXT SESSION: Have pt fill out ODI, perform further assessment of low back.  Assess 5x sit <> stand and gait speed and write goal.  Continue with NMES for improved quad control.  Nedra Hai, PT, DPT 06/05/23 10:49 AM

## 2023-06-08 ENCOUNTER — Ambulatory Visit: Payer: Medicare PPO

## 2023-06-08 DIAGNOSIS — R2689 Other abnormalities of gait and mobility: Secondary | ICD-10-CM

## 2023-06-08 DIAGNOSIS — Z9181 History of falling: Secondary | ICD-10-CM

## 2023-06-08 DIAGNOSIS — R2681 Unsteadiness on feet: Secondary | ICD-10-CM | POA: Diagnosis not present

## 2023-06-08 DIAGNOSIS — R200 Anesthesia of skin: Secondary | ICD-10-CM | POA: Diagnosis not present

## 2023-06-08 DIAGNOSIS — M5416 Radiculopathy, lumbar region: Secondary | ICD-10-CM | POA: Diagnosis not present

## 2023-06-08 DIAGNOSIS — M79604 Pain in right leg: Secondary | ICD-10-CM | POA: Diagnosis not present

## 2023-06-08 DIAGNOSIS — R208 Other disturbances of skin sensation: Secondary | ICD-10-CM | POA: Diagnosis not present

## 2023-06-08 NOTE — Therapy (Signed)
OUTPATIENT PHYSICAL THERAPY TREATMENT NOTE   Patient Name: Kristopher Gay MRN: [id number] DOB:[birth year], 72 y.o., male Today's Date: 06/08/2023  END OF SESSION:  PT End of Session - 06/08/23 1116     Visit Number 4    Number of Visits 9    Date for PT Re-Evaluation 07/28/23    Authorization Type [employer]    PT Start Time 1105    PT Stop Time 1150    PT Time Calculation (min) 45 min    Activity Tolerance Patient tolerated treatment well    Behavior During Therapy WFL for tasks assessed/performed               Past Medical History:  Diagnosis Date   A-fib (HCC)    Allergic rhinitis, cause unspecified    Allergy    Anxiety    Blood clot in vein 2002   left calf SUPERFICIAL CLOT REMOVED THEN PART OF VEIN REMOVED   Chronic headaches    COPD (chronic obstructive pulmonary disease) (HCC)    Cough    X 1 YEAR NO FEVER CLEAR SPUTUM   DDD (degenerative disc disease)    Depression    DJD (degenerative joint disease)    ALL THE WAY DOWN SPINE   Dropfoot    LEFT , NO BRACE WORN NOW   Dysrhythmia    a fib one time after hernia surgery   Emphysema of lung (HCC)    Extrinsic asthma, unspecified    GERD (gastroesophageal reflux disease)    OCC NO MEDS FOR   Hypertension    STOPPED HTN MEDS UNTIL 2011, THEN HAD WEIGHT LOSS, NO MEDS SINCE   Neuromuscular disorder (HCC)    neuropathy  legs and feet   Neuropathy    POLYNEUOPATHOPATHY FEET AND LEGS   Peripheral vascular disease (HCC)    VARICOSE VEINS LEFT LEG   Pneumonia AS CHILD   PONV (postoperative nausea and vomiting)    Syncope    Ulnar nerve entrapment at elbow, right    Umbilical hernia    Unspecified asthma(493.90)    Past Surgical History:  Procedure Laterality Date   COLONSCOPY  06/16/2017   HERNIA REPAIR     INGUINAL HERNIA REPAIR Bilateral 07/20/2017   Procedure: LAPAROSCOPIC BILATERAL INGUINAL HERNIA REPAIR WITH MESH, UMBILICAL HERNIA REPAIR AND LYSIS OF ADHESIONS;  Surgeon: [name], De Blanch,  MD;  Location: [facility];  Service: General;  Laterality: Bilateral;   LAPAROSCOPY N/A 07/20/2017   Procedure: LAPAROSCOPY DIAGNOSTIC;  Surgeon: Rodman Pickle, MD;  Location: [facility];  Service: General;  Laterality: N/A;   QUADRICEPS TENDON REPAIR Right 08/07/2020   Procedure: REPAIR QUADRICEP TENDON;  Surgeon: Sheral Apley, MD;  Location: [facility];  Service: Orthopedics;  Laterality: Right;  NEED RAPID TEST;SAME DAY WORKUP; COMING FROM [facility]   QUADRICEPS TENDON REPAIR Right 02/26/2021   Procedure: REPAIR QUADRICEP TENDON;  Surgeon: Sheral Apley, MD;  Location: [facility];  Service: Orthopedics;  Laterality: Right;   ROTATOR CUFF REPAIR Left 2005   detached; left shoulder-reattached   SHOULDER SURGERY Right    laBRAL  tendon torn   SUPERFICIAL LEFT LEG VEIN STRIPPING WITH SMALL SUPERFICIAL CLOT  2002   ULNAR NERVE TRANSPOSITION Right 05/10/2018   Procedure: RIGHT ELBOW ULNAR NERVE RELEASE;  Surgeon: Bradly Bienenstock, MD;  Location: [facility];  Service: Orthopedics;  Laterality: Right;   UMBILICAL HERNIA REPAIR  02/18/2018   UMBILICAL HERNIA REPAIR N/A 02/18/2018   Procedure: LAPAROSCOPIC UMBILICAL HERNIA  REPAIR ERAS PATHWAY;  Surgeon: [name], De Blanch, MD;  Location: [facility];  Service: General;  Laterality: N/A;   Patient Active Problem List   Diagnosis Date Noted   Rupture of quadriceps tendon, right, sequela 02/26/2021   Rupture of right quadriceps tendon 08/07/2020   Ulnar neuropathy at elbow, right 05/03/2018   PAF (paroxysmal atrial fibrillation) (HCC) 02/20/2018   Chronic right shoulder pain 05/15/2016   Right knee pain 05/15/2016   Adrenal adenoma 02/20/2016   Chronic pain syndrome 10/12/2015   Dysphagia, pharyngoesophageal phase 09/14/2014   Other chronic postoperative pain 01/16/2014   Allergy, insect bite 07/02/2012   GERD (gastroesophageal reflux disease) 03/25/2012   Injury to peroneal nerve 03/12/2012   Lumbosacral spondylosis 01/13/2012   Chronic  back pain 11/17/2011   Seasonal and perennial allergic rhinitis 12/26/2010    ONSET DATE: 05/07/2023   REFERRING DIAG: M54.16 (ICD-10-CM) - Lumbar radiculopathy R20.0 (ICD-10-CM) - Bilateral leg numbness  PCP: No PCP  REFERRING PROVIDER: Glendale Chard, DO   Rationale for Evaluation and Treatment: Rehabilitation  THERAPY DIAG:  Other abnormalities of gait and mobility  Unsteadiness on feet  History of falling  Pain of right lower extremity  ONSET DATE: 05/07/2023  SUBJECTIVE:                                                                                                                                                                                           SUBJECTIVE STATEMENT:  I am doing okay. Back pain is 6/10. Leg pain is 3/10. When he is walking, he feels his R foot more which he didn't before. He has been doing exercises at home.   PERTINENT HISTORY:  PMH: A-fib, anxiety, chronic headaches, COPD, emphysema, HTN, Longstanding history of idiopathic neuropathy , peripheral vascular disease, hx of R quadriceps tendon repair in 2021/2022  Asymmetric right leg numbness maybe due to right lumbar radiculopathy.   PAIN:  Are you having pain? Yes: NPRS scale: 5/10 for knee pain, 7/10 back pain  Pain location: R side of back, R knee, numbness down R leg  Pain description: Numbness, back is very tiring  Aggravating factors: Walking too far or getting around the house  Relieving factors: Exercises that he does at the gym that help his back   PRECAUTIONS: Fall   WEIGHT BEARING RESTRICTIONS: No  FALLS:  Has patient fallen in last 6 months? Yes. Number of falls 1 or 2 falls, can't recall how he fell, but knows it happens in the yard, can't remember the details   LIVING ENVIRONMENT:  Has following equipment at home: Single point cane, Walker - 2 wheeled, and doesn't  use RW as much, but it is there.   PLOF: Independent  PATIENT GOALS: Wants to improve the numbness in  his legs   NEXT MD VISIT: Jacalyn Lefevre, Phys Med on 08/18/23   OBJECTIVE:   DIAGNOSTIC FINDINGS:  MRI lumbar spine: 04/12/23: IMPRESSION: 1. No acute findings or clear explanation for the patient's symptoms. 2. Chronic multilevel spondylosis associated with a convex right scoliosis, only minimally progressive from previous MRI from 2008. 3. Chronic left foraminal narrowing at L3-4 with probable chronic left L3 nerve root encroachment. 4. Chronic right-greater-than-left foraminal narrowing at L4-5 and L5-S1.  PATIENT SURVEYS:  Modified Oswestry Will provide at next session    COGNITION: Overall cognitive status: Within functional limits for tasks assessed     SENSATION: Light touch: Impaired  and due to neuropathy, more difficult to tell with RLE more distally, numbness down RLE Proprioception: WFL and able to detect at bilat ankles in PF/DF, but when tested on R foot, pt reports that it feels numb or an abnormal pain that it has to describe    POSTURE: rounded shoulders and posterior pelvic tilt Trunk flexed during gait    LOWER EXTREMITY ROM:     Limited R knee extension AROM due to hx of R quad tendon repairs   Active  Right eval Left eval Right 06/02/23 Active/passive  Hip flexion     Hip extension     Hip abduction     Hip adduction     Hip internal rotation     Hip external rotation     Knee flexion Limited, can only do about 90-95 degrees of knee flexion due to hx of quad tendon repairs  115/120  Knee extension   -25/0  Ankle dorsiflexion     Ankle plantarflexion     Ankle inversion     Ankle eversion      (Blank rows = not tested)  LOWER EXTREMITY MMT:    MMT Right eval Left eval  Hip flexion 3+ 4-  Hip extension    Hip abduction    Hip adduction    Hip internal rotation    Hip external rotation    Knee flexion 4 4+  Knee extension 3- (pain in the knee) 4-  Ankle dorsiflexion 4 (pt reports incr pain) 5  Ankle plantarflexion    Ankle  inversion    Ankle eversion     (Blank rows = not tested)  GAIT: Gait pattern: decreased stance time- Right, knee flexed in stance- Right, antalgic, trunk flexed, and wide BOS Distance walked: Clinic distances  Assistive device utilized: Single point cane Level of assistance: SBA    TODAY'S TREATMENT:                                                                                                                               06/08/23   TherEx  Tried lumbar rotations- attempted in partial range with cues to avoid pain, 5" hold  at end range: 10 x R and L SKTC: 2 x 5, 5 sec holds R and L Bridges x 10 Posterior pelvic tilts: 10 x Supine hooklying ab bracing with alternating knee fall outs: yellow sport cord: 10 x R and L Posterior knee to chest stretch: 10x R only NMES: [place] unit: 68mA-53mA, 10 sec on 30 sec off, AA SAQ performed in supine for 15' total of treatment time.   5x sit to stand: 21 sec without UE support (06/08/23) ODI:66% severe disability (06/08/23) Gait speed: 0.77 m/s without cane (06/08/23)    PATIENT EDUCATION:  Education details: Clinical findings, POC, explained that PT can't treat pt's numbness, but PT can provide some stretches/exercises for pt's low back and for strengthening his legs. Began to explain general lumbar MRI findings. Ran out of time during eval to answer all of pt's questions.  Person educated: Patient Education method: Explanation Education comprehension: verbalized understanding and needs further education  HOME EXERCISE PROGRAM: Access Code: [email or web address] URL: [email or web address] Date: 06/05/2023 Prepared by: Nedra Hai  Exercises - Supine Single Knee to Chest Stretch  - 2 x daily - 7 x weekly - 1 sets - 5 reps - 5-10 seconds  hold - Supine Bridge  - 1-2 x daily - 7 x weekly - 1 sets - 5-10 reps - 1 second hold - Supine Figure 4 Piriformis Stretch  - 2 x daily - 7 x weekly - 1 sets - 3 reps - 30 hold - Supine  Posterior Pelvic Tilt  - 1-2 x daily - 7 x weekly - 1 sets - 10 reps - 2-3 seconds  hold   ASSESSMENT:  CLINICAL IMPRESSION:   Today's session focused on continuing to work on improving lumbar/hip flexibility and improving core stability. Patient reported improved feeling in his foot with passive knee to chest flexion. Patient is demonstrating improving quad control with NMES with decreasing quad lag.   OBJECTIVE IMPAIRMENTS: Abnormal gait, decreased activity tolerance, decreased balance, decreased endurance, decreased knowledge of use of DME, decreased mobility, difficulty walking, decreased ROM, decreased strength, hypomobility, impaired sensation, postural dysfunction, and pain.   ACTIVITY LIMITATIONS: standing, squatting, stairs, transfers, and locomotion level  PARTICIPATION LIMITATIONS: community activity  PERSONAL FACTORS: Age, Behavior pattern, Past/current experiences, Time since onset of injury/illness/exacerbation, and 3+ comorbidities:  A-fib, anxiety, chronic headaches, COPD, emphysema, HTN, Longstanding history of idiopathic neuropathy , peripheral vascular disease, hx of R quadriceps tendon repair in 2021/2022  are also affecting patient's functional outcome.   REHAB POTENTIAL: Good  CLINICAL DECISION MAKING: Evolving/moderate complexity  EVALUATION COMPLEXITY: Moderate   GOALS: Goals reviewed with patient? Yes  SHORT TERM GOALS: ALL STGS = LTGS  LONG TERM GOALS: Target date: 06/26/2023  Pt will be independent with final HEP in order to build upon functional gains made in therapy. Baseline:  Goal status: INITIAL  2.  Pt will report 10% improvement on ODI to improve overall function Baseline: 66% disability (06/08/11) Goal status: INITIAL  3.  Pt will demo >0.8 m/s gait speed without AD to improve community ambulation Baseline: 0.76 m/s without AD (06/08/23) Goal status: INITIAL  4.  Pt will demo <16 sec without UE support with 5x sit to stand to improve  functional strength. Baseline: 21 sec (06/08/23) Goal status: INITIAL    PLAN:  PT FREQUENCY: 2x/week  PT DURATION: 8 weeks   PLANNED INTERVENTIONS: Therapeutic exercises, Therapeutic activity, Neuromuscular re-education, Balance training, Gait training, Patient/Family education, Self Care, Joint mobilization, Stair training, Orthotic/Fit training, DME instructions, Aquatic Therapy,  Electrical stimulation, Spinal mobilization, Taping, Manual therapy, and Re-evaluation.  PLAN FOR NEXT SESSION: continue with core stability, hip strengthening, knee strengthening, use NMES for quad control (pt is reporting improving neuropathy with NMES to knee)   Ileana Ladd, PT 06/08/2023, 12:04 PM

## 2023-06-10 ENCOUNTER — Emergency Department (HOSPITAL_COMMUNITY)
Admission: EM | Admit: 2023-06-10 | Discharge: 2023-06-10 | Disposition: A | Discharge status: Home / Self Care | Payer: Medicare PPO | Attending: Emergency Medicine | Admitting: Emergency Medicine

## 2023-06-10 ENCOUNTER — Other Ambulatory Visit: Payer: Self-pay

## 2023-06-10 ENCOUNTER — Encounter (HOSPITAL_COMMUNITY): Payer: Self-pay

## 2023-06-10 DIAGNOSIS — M25472 Effusion, left ankle: Secondary | ICD-10-CM | POA: Diagnosis present

## 2023-06-10 DIAGNOSIS — L03116 Cellulitis of left lower limb: Secondary | ICD-10-CM | POA: Diagnosis not present

## 2023-06-10 LAB — URINALYSIS, ROUTINE W REFLEX MICROSCOPIC
Bilirubin Urine: NEGATIVE
Glucose, UA: NEGATIVE mg/dL
Hgb urine dipstick: NEGATIVE
Ketones, ur: NEGATIVE mg/dL
Leukocytes,Ua: NEGATIVE
Nitrite: NEGATIVE
Protein, ur: NEGATIVE mg/dL
Specific Gravity, Urine: 1.023 (ref 1.005–1.030)
pH: 5 (ref 5.0–8.0)

## 2023-06-10 LAB — COMPREHENSIVE METABOLIC PANEL
ALT: 22 U/L (ref 0–44)
AST: 24 U/L (ref 15–41)
Albumin: 3.9 g/dL (ref 3.5–5.0)
Alkaline Phosphatase: 75 U/L (ref 38–126)
Anion gap: 9 (ref 5–15)
BUN: 21 mg/dL (ref 8–23)
CO2: 23 mmol/L (ref 22–32)
Calcium: 8.7 mg/dL — ABNORMAL LOW (ref 8.9–10.3)
Chloride: 104 mmol/L (ref 98–111)
Creatinine, Ser: 1.35 mg/dL — ABNORMAL HIGH (ref 0.61–1.24)
GFR, Estimated: 56 mL/min — ABNORMAL LOW (ref >60)
Glucose, Bld: 121 mg/dL — ABNORMAL HIGH (ref 70–99)
Potassium: 4.1 mmol/L (ref 3.5–5.1)
Sodium: 136 mmol/L (ref 135–145)
Total Bilirubin: 0.7 mg/dL (ref 0.3–1.2)
Total Protein: 7.4 g/dL (ref 6.5–8.1)

## 2023-06-10 LAB — CBC WITH DIFFERENTIAL/PLATELET
Abs Immature Granulocytes: 0.03 10*3/uL (ref 0.00–0.07)
Basophils Absolute: 0 10*3/uL (ref 0.0–0.1)
Basophils Relative: 0 %
Eosinophils Absolute: 0.1 10*3/uL (ref 0.0–0.5)
Eosinophils Relative: 2 %
HCT: 39.7 % (ref 39.0–52.0)
Hemoglobin: 13.4 g/dL (ref 13.0–17.0)
Immature Granulocytes: 0 %
Lymphocytes Relative: 12 %
Lymphs Abs: 1 10*3/uL (ref 0.7–4.0)
MCH: 32.2 pg (ref 26.0–34.0)
MCHC: 33.8 g/dL (ref 30.0–36.0)
MCV: 95.4 fL (ref 80.0–100.0)
Monocytes Absolute: 0.7 10*3/uL (ref 0.1–1.0)
Monocytes Relative: 9 %
Neutro Abs: 6.1 10*3/uL (ref 1.7–7.7)
Neutrophils Relative %: 77 %
Platelets: 195 10*3/uL (ref 150–400)
RBC: 4.16 MIL/uL — ABNORMAL LOW (ref 4.22–5.81)
RDW: 12.4 % (ref 11.5–15.5)
WBC: 7.9 10*3/uL (ref 4.0–10.5)
nRBC: 0 % (ref 0.0–0.2)

## 2023-06-10 LAB — I-STAT CG4 LACTIC ACID, ED: Lactic Acid, Venous: 1.4 mmol/L (ref 0.5–1.9)

## 2023-06-10 MED ORDER — ACETAMINOPHEN 325 MG PO TABS
650.0000 mg | ORAL_TABLET | Freq: Once | ORAL | Status: AC
  Administered 2023-06-10: 650 mg via ORAL
  Filled 2023-06-10: qty 2

## 2023-06-10 MED ORDER — DOXYCYCLINE HYCLATE 100 MG PO CAPS: 100.0000 mg | ORAL_CAPSULE | Freq: Two times a day (BID) | ORAL | 0 refills | Status: AC

## 2023-06-10 MED ORDER — DOXYCYCLINE HYCLATE 100 MG PO TABS
100.0000 mg | ORAL_TABLET | Freq: Once | ORAL | Status: AC
  Administered 2023-06-10: 100 mg via ORAL
  Filled 2023-06-10: qty 1

## 2023-06-10 NOTE — ED Provider Notes (Cosign Needed Addendum)
[place] EMERGENCY DEPARTMENT AT [facility] Provider Note   CSN: [id number] Arrival date & time: 06/10/23  1218     History  Chief Complaint  Patient presents with   Wound Infection    Kristopher Gay is a 72 y.o. male with a history of emphysema, atrial fibrillation, and peripheral vascular disease presents to the ED today for a wound check. Patient has a open wound to his left medial ankle with associated swelling, redness, and warmth to touch. He first noticed a small abrasion about a week ago but has developed pain and redness about 2 days ago.  Patient is still able to ambulate on the leg.  Denies fever, chills, loss of sensation or strength.  He has tried elevating his leg and taking Tylenol for pain without any relief.  No other complaints or concerns at this time.    Home Medications Prior to Admission medications   Medication Sig Start Date End Date Taking? Authorizing Provider  doxycycline (VIBRAMYCIN) 100 MG capsule Take 1 capsule (100 mg total) by mouth 2 (two) times daily for 7 days. 06/10/23 06/17/23 Yes Maxwell Marion, PA-C  acetaminophen (TYLENOL) 500 MG tablet Take 2 tablets (1,000 mg total) by mouth every 6 (six) hours as needed for mild pain or moderate pain. 03/01/21   Jenne Pane, PA-C  ascorbic acid (VITAMIN C) 1000 MG tablet Take 2,000 mg by mouth daily.    [provider]  atorvastatin (LIPITOR) 40 MG tablet TAKE 1 TABLET(40 MG) BY MOUTH DAILY 05/18/23   Carlos Levering, NP  Azelastine-Fluticasone 137-50 MCG/ACT SUSP 1-2 puffs each nostril twice daily as needed Patient not taking: Reported on 03/27/2023 12/22/22   Waymon Budge, MD  Calcium Carbonate-Vit D-Min (CALCIUM 1200 PO) Take 1 tablet by mouth in the morning and at bedtime.    [provider]  celecoxib (CELEBREX) 100 MG capsule TAKE 1 CAPSULE BY MOUTH TWICE DAILY 04/14/23   Jones Bales, NP  Cholecalciferol (VITAMIN D3) 50 MCG (2000 UT) capsule Take 2,000 Units by mouth  in the morning and at bedtime.    [provider]  cyanocobalamin 1000 MCG tablet Take 1,000 mcg by mouth in the morning and at bedtime.    [provider]  DHEA 25 MG CAPS Take 25 mg by mouth daily.    [provider]  docusate sodium (DOK) 100 MG capsule     [provider]  EPINEPHrine 0.3 mg/0.3 mL IJ SOAJ injection Inject 0.3 mg into the muscle as needed for anaphylaxis.    [provider]  fluticasone-salmeterol [place] INHUB) 500-50 MCG/ACT AEPB     [provider]  Fluticasone-Umeclidin-Vilant (TRELEGY ELLIPTA) 100-62.5-25 MCG/ACT AEPB Inhale 1 puff then rinse mouth, once daily 12/22/22   Jetty Duhamel D, MD  furosemide (LASIX) 20 MG tablet TAKE 1 TABLET(20 MG) BY MOUTH DAILY 02/12/23   Ronnald Nian, MD  gabapentin (NEURONTIN) 300 MG capsule TAKE 2 CAPSULES BY MOUTH EVERY MORNING, 1 CAPSULE MID DAY AND 2 CAPSULES AT BEDTIME 01/23/23   Kirsteins, Victorino Sparrow, MD  Glucosamine HCl-MSM (GLUCOSAMINE-MSM PO) Take 1 tablet by mouth in the morning and at bedtime.    [provider]  ibuprofen (ADVIL) 800 MG tablet     [provider]  Magnesium 250 MG TABS Take 250 mg by mouth daily.    [provider]  metoprolol succinate (TOPROL-XL) 25 MG 24 hr tablet TAKE 1 TABLET(25 MG) BY MOUTH DAILY 07/28/22   [name], Iantha Fallen  C, MD  montelukast (SINGULAIR) 10 MG tablet TAKE 1 TABLET(10 MG) BY MOUTH DAILY 02/12/23   Ronnald Nian, MD  Multiple Vitamin (MULTIVITAMIN) tablet Take 1 tablet by mouth 2 (two) times daily.     [provider]  nitroGLYCERIN (NITROSTAT) 0.4 MG SL tablet Place 0.4 mg under the tongue every 5 (five) minutes as needed for chest pain.    [provider]  Omega-3 Fatty Acids (FISH OIL) 1200 MG CAPS Take 1,200 mg by mouth in the morning and at bedtime.    [provider]  Polyethylene Glycol 3350 (MIRALAX PO)     [provider]  traMADol (ULTRAM) 50 MG tablet TAKE 2 TABLETS BY  MOUTH EVERY 6 HOURS AS NEEDED FOR PAIN 02/16/23   Jones Bales, NP  vitamin E 180 MG (400 UNITS) capsule Take 400 Units by mouth in the morning and at bedtime.    [provider]      Allergies    Bee venom, Linaclotide, Molds & smuts, and Seasonal ic [cholestatin]    Review of Systems   Review of Systems  Skin:  Positive for wound.  All other systems reviewed and are negative.   Physical Exam Updated Vital Signs BP (!) 145/81   Pulse 62   Temp 98.1 F (36.7 C)   Resp 18   Ht 6\' 3"  (1.905 m)   Wt 99.8 kg   SpO2 98%   BMI 27.50 kg/m  Physical Exam Vitals and nursing note reviewed.  Constitutional:      Appearance: Normal appearance.  HENT:     Head: Normocephalic and atraumatic.     Mouth/Throat:     Mouth: Mucous membranes are moist.  Eyes:     Conjunctiva/sclera: Conjunctivae normal.     Pupils: Pupils are equal, round, and reactive to light.  Cardiovascular:     Rate and Rhythm: Normal rate and regular rhythm.     Pulses: Normal pulses.  Pulmonary:     Effort: Pulmonary effort is normal.     Breath sounds: Normal breath sounds.  Abdominal:     Palpations: Abdomen is soft.     Tenderness: There is no abdominal tenderness.  Musculoskeletal:        General: Swelling present.     Left lower leg: Edema present.     Comments: Swelling and erythema to dorsum of left foot, ankle, and distal calf with tenderness to palpation. 2+ dorsalis pedis pulse.  Skin:    General: Skin is warm and dry.     Findings: Erythema and lesion present.  Neurological:     General: No focal deficit present.     Mental Status: He is alert.  Psychiatric:        Mood and Affect: Mood normal.        Behavior: Behavior normal.     ED Results / Procedures / Treatments   Labs (all labs ordered are listed, but only abnormal results are displayed) Labs Reviewed  COMPREHENSIVE METABOLIC PANEL - Abnormal; Notable for the following components:      Result Value   Glucose, Bld  121 (*)    Creatinine, Ser 1.35 (*)    Calcium 8.7 (*)    GFR, Estimated 56 (*)    All other components within normal limits  CBC WITH DIFFERENTIAL/PLATELET - Abnormal; Notable for the following components:   RBC 4.16 (*)    All other components within normal limits  URINALYSIS, ROUTINE W REFLEX MICROSCOPIC  I-STAT CG4  LACTIC ACID, ED    EKG None  Radiology No results found.  Procedures Procedures: not indicated.   Medications Ordered in ED Medications - No data to display  ED Course/ Medical Decision Making/ A&P                                 Medical Decision Making Amount and/or Complexity of Data Reviewed Labs: ordered.  Risk OTC drugs. Prescription drug management.   This patient presents to the ED for concern of left leg wound, this involves an extensive number of treatment options, and is a complaint that carries with it a high risk of complications and morbidity.   Differential diagnosis includes: cellulitis, MRSA, venous ulcer, sepsis, osteomyelitis, etc. Low probability for osteomyelitis or sepsis since patient is hemodynamically stable, afebrile, no elevated white count, and normal lactic acid   Comorbidities  See HPI above   Additional History  Additional history obtained from patient's previous ED notes.   Lab Tests  Labs ordered in triage.  The pertinent results include:   CMP and CBC are within normal limit - no signs of acute infection, anemia, electrolyte abnormality, or AKI. UA is unremarkable. Lactic acid is unremarkable - no indication of sepsis.   Problem List / ED Course / Critical Interventions / Medication Management  Left lower leg wound First dose of Doxycycline and Tylenol given prior to discharge. I have reviewed the patients home medicines and have made adjustments as needed   Social Determinants of Health  Physical activity   Test / Admission - Considered  Discussed results of labs with patient.  All questions  were answered. He is hemodynamically stable and safe for discharge home. Prescription for Doxycycline sent to the pharmacy. Return precautions provided.       Final Clinical Impression(s) / ED Diagnoses Final diagnoses:  Cellulitis of left lower extremity    Rx / DC Orders ED Discharge Orders          Ordered    doxycycline (VIBRAMYCIN) 100 MG capsule  2 times daily        06/10/23 1416              Maxwell Marion, PA-C 06/10/23 1449    Maxwell Marion, PA-C 06/10/23 1504    Maxwell Marion, PA-C 06/10/23 1528    Pricilla Loveless, MD 06/11/23 [phone]

## 2023-06-10 NOTE — ED Triage Notes (Signed)
Pt presenting with a wound on the left medial side of ankle. States he scrapped it last Thursday. Wound now has redness, and swelling at site. States that is not healing, hx of neuropathy in both legs.

## 2023-06-10 NOTE — Discharge Instructions (Addendum)
As discussed, your labs and vitals don't indicate any signs of systemic infection. I have sent a prescription to your pharmacy for Doxycycline. Take this medication twice a day for the next 7 days. Please complete full course of antibiotics to ensure resolution of skin infection. It will take about 2 days for the antibiotic to take effect. You can take Tylenol every 6 hours as needed for pain.  Please follow up with your PCP in 3-5 days for reevaluation of your symptoms.  Get help right away if: You notice red streaks coming from the infected area. You notice the skin turns purple or black and falls off.

## 2023-06-10 NOTE — ED Notes (Signed)
Patient is wanting meds before being released

## 2023-06-10 NOTE — ED Notes (Signed)
Patient is wanting to have his foot and leg looked at by the doctor again

## 2023-06-11 ENCOUNTER — Ambulatory Visit: Payer: Medicare PPO

## 2023-06-15 ENCOUNTER — Ambulatory Visit: Payer: Medicare PPO | Admitting: Physical Therapy

## 2023-06-15 ENCOUNTER — Ambulatory Visit (HOSPITAL_COMMUNITY)
Admission: EM | Admit: 2023-06-15 | Discharge: 2023-06-15 | Disposition: A | Discharge status: Home / Self Care | Payer: Medicare PPO | Attending: Internal Medicine | Admitting: Internal Medicine

## 2023-06-15 ENCOUNTER — Encounter: Payer: Self-pay | Admitting: Physical Therapy

## 2023-06-15 ENCOUNTER — Encounter (HOSPITAL_COMMUNITY): Payer: Self-pay

## 2023-06-15 DIAGNOSIS — Z9181 History of falling: Secondary | ICD-10-CM

## 2023-06-15 DIAGNOSIS — R2689 Other abnormalities of gait and mobility: Secondary | ICD-10-CM

## 2023-06-15 DIAGNOSIS — M79604 Pain in right leg: Secondary | ICD-10-CM | POA: Diagnosis not present

## 2023-06-15 DIAGNOSIS — L97322 Non-pressure chronic ulcer of left ankle with fat layer exposed: Secondary | ICD-10-CM | POA: Diagnosis not present

## 2023-06-15 DIAGNOSIS — R2681 Unsteadiness on feet: Secondary | ICD-10-CM | POA: Diagnosis not present

## 2023-06-15 DIAGNOSIS — I872 Venous insufficiency (chronic) (peripheral): Secondary | ICD-10-CM

## 2023-06-15 DIAGNOSIS — L03116 Cellulitis of left lower limb: Secondary | ICD-10-CM | POA: Diagnosis not present

## 2023-06-15 DIAGNOSIS — R208 Other disturbances of skin sensation: Secondary | ICD-10-CM | POA: Diagnosis not present

## 2023-06-15 DIAGNOSIS — M5416 Radiculopathy, lumbar region: Secondary | ICD-10-CM | POA: Diagnosis not present

## 2023-06-15 DIAGNOSIS — R200 Anesthesia of skin: Secondary | ICD-10-CM | POA: Diagnosis not present

## 2023-06-15 MED ORDER — CIPROFLOXACIN HCL 500 MG PO TABS
500.0000 mg | ORAL_TABLET | Freq: Two times a day (BID) | ORAL | 0 refills | Status: AC
Start: 2023-06-15 — End: 2023-06-29

## 2023-06-15 NOTE — Discharge Instructions (Addendum)
Wash wound with soap and water daily. Apply Xeroform directly to the wound and cover with a dry dressing, may use rolled gauze to hold in place Take Cipro 500 mg twice daily for 14 days Make follow-up appointment with wound care at [facility] if they do not contact you Call to make appointment with vascular surgery as listed Keep leg elevated above heart is much as possible during the day Return to urgent care if symptoms worsen or fail to improve

## 2023-06-15 NOTE — ED Triage Notes (Signed)
Patient c/o left inner ankle wound and went to an ED (06/10/23) and states he was prescribed Doxycycline, but has not completed. Patient has increased redness to the mid calf and pain to the left inner ankle.  Patient went to his Neurological rehab appointment today and the patient was told he could not be seen again until wound improved.

## 2023-06-15 NOTE — ED Provider Notes (Signed)
[facility]    CSN: [id number] Arrival date & time: 06/15/23  1522      History   Chief Complaint No chief complaint on file.   HPI Kristopher Gay is a 72 y.o. male.   72 year old male who was seen in urgent care secondary to a left medial malleolus ulceration.  He first noticed this the first week of August as an abrasion.  The next week it started to swell and became painful and red.  He was seen in the emergency room on August 14 and started on doxycycline.  He presents today as it is not improving.  He continues to have swelling when he is up on it during the day.  The swelling does improve somewhat at night.  He reports that he has chronic skin changes started when he was in rehab and that that is when he started developing swelling in his legs as well.  He denies any history of poor healing in the past.  He has not seen vascular surgery or wound care.  He denies fevers or chills.  He denies any other constitutional symptoms.     Past Medical History:  Diagnosis Date   A-fib [facility])    Allergic rhinitis, cause unspecified    Allergy    Anxiety    Blood clot in vein 2002   left calf SUPERFICIAL CLOT REMOVED THEN PART OF VEIN REMOVED   Chronic headaches    COPD (chronic obstructive pulmonary disease) (HCC)    Cough    X 1 YEAR NO FEVER CLEAR SPUTUM   DDD (degenerative disc disease)    Depression    DJD (degenerative joint disease)    ALL THE WAY DOWN SPINE   Dropfoot    LEFT , NO BRACE WORN NOW   Dysrhythmia    a fib one time after hernia surgery   Emphysema of lung (HCC)    Extrinsic asthma, unspecified    GERD (gastroesophageal reflux disease)    OCC NO MEDS FOR   Hypertension    STOPPED HTN MEDS UNTIL 2011, THEN HAD WEIGHT LOSS, NO MEDS SINCE   Neuromuscular disorder (HCC)    neuropathy  legs and feet   Neuropathy    POLYNEUOPATHOPATHY FEET AND LEGS   Peripheral vascular disease (HCC)    VARICOSE VEINS LEFT LEG   Pneumonia AS CHILD   PONV  (postoperative nausea and vomiting)    Syncope    Ulnar nerve entrapment at elbow, right    Umbilical hernia    Unspecified asthma(493.90)     Patient Active Problem List   Diagnosis Date Noted   Rupture of quadriceps tendon, right, sequela 02/26/2021   Rupture of right quadriceps tendon 08/07/2020   Ulnar neuropathy at elbow, right 05/03/2018   PAF (paroxysmal atrial fibrillation) (HCC) 02/20/2018   Chronic right shoulder pain 05/15/2016   Right knee pain 05/15/2016   Adrenal adenoma 02/20/2016   Chronic pain syndrome 10/12/2015   Dysphagia, pharyngoesophageal phase 09/14/2014   Other chronic postoperative pain 01/16/2014   Allergy, insect bite 07/02/2012   GERD (gastroesophageal reflux disease) 03/25/2012   Injury to peroneal nerve 03/12/2012   Lumbosacral spondylosis 01/13/2012   Chronic back pain 11/17/2011   Seasonal and perennial allergic rhinitis 12/26/2010    Past Surgical History:  Procedure Laterality Date   COLONSCOPY  06/16/2017   HERNIA REPAIR     INGUINAL HERNIA REPAIR Bilateral 07/20/2017   Procedure: LAPAROSCOPIC BILATERAL INGUINAL HERNIA REPAIR WITH MESH, UMBILICAL HERNIA REPAIR AND  LYSIS OF ADHESIONS;  Surgeon: [name], De Blanch, MD;  Location: [facility];  Service: General;  Laterality: Bilateral;   LAPAROSCOPY N/A 07/20/2017   Procedure: LAPAROSCOPY DIAGNOSTIC;  Surgeon: Sheliah Hatch De Blanch, MD;  Location: [facility];  Service: General;  Laterality: N/A;   QUADRICEPS TENDON REPAIR Right 08/07/2020   Procedure: REPAIR QUADRICEP TENDON;  Surgeon: Sheral Apley, MD;  Location: [facility];  Service: Orthopedics;  Laterality: Right;  NEED RAPID TEST;SAME DAY WORKUP; COMING FROM [facility]   QUADRICEPS TENDON REPAIR Right 02/26/2021   Procedure: REPAIR QUADRICEP TENDON;  Surgeon: Sheral Apley, MD;  Location: [facility];  Service: Orthopedics;  Laterality: Right;   ROTATOR CUFF REPAIR Left 2005   detached; left shoulder-reattached   SHOULDER SURGERY  Right    laBRAL  tendon torn   SUPERFICIAL LEFT LEG VEIN STRIPPING WITH SMALL SUPERFICIAL CLOT  2002   ULNAR NERVE TRANSPOSITION Right 05/10/2018   Procedure: RIGHT ELBOW ULNAR NERVE RELEASE;  Surgeon: Bradly Bienenstock, MD;  Location: [facility];  Service: Orthopedics;  Laterality: Right;   UMBILICAL HERNIA REPAIR  02/18/2018   UMBILICAL HERNIA REPAIR N/A 02/18/2018   Procedure: LAPAROSCOPIC UMBILICAL HERNIA REPAIR ERAS PATHWAY;  Surgeon: [name], De Blanch, MD;  Location: [facility];  Service: General;  Laterality: N/A;       Home Medications    Prior to Admission medications   Medication Sig Start Date End Date Taking? Authorizing Provider  ciprofloxacin (CIPRO) 500 MG tablet Take 1 tablet (500 mg total) by mouth 2 (two) times daily for 14 days. 06/15/23 06/29/23 Yes Edmonia Gonser A, PA-C  acetaminophen (TYLENOL) 500 MG tablet Take 2 tablets (1,000 mg total) by mouth every 6 (six) hours as needed for mild pain or moderate pain. 03/01/21   Jenne Pane, PA-C  ascorbic acid (VITAMIN C) 1000 MG tablet Take 2,000 mg by mouth daily.    [provider]  atorvastatin (LIPITOR) 40 MG tablet TAKE 1 TABLET(40 MG) BY MOUTH DAILY 05/18/23   Carlos Levering, NP  Azelastine-Fluticasone 137-50 MCG/ACT SUSP 1-2 puffs each nostril twice daily as needed Patient not taking: Reported on 03/27/2023 12/22/22   Waymon Budge, MD  Calcium Carbonate-Vit D-Min (CALCIUM 1200 PO) Take 1 tablet by mouth in the morning and at bedtime.    [provider]  celecoxib (CELEBREX) 100 MG capsule TAKE 1 CAPSULE BY MOUTH TWICE DAILY 04/14/23   Jones Bales, NP  Cholecalciferol (VITAMIN D3) 50 MCG (2000 UT) capsule Take 2,000 Units by mouth in the morning and at bedtime.    [provider]  cyanocobalamin 1000 MCG tablet Take 1,000 mcg by mouth in the morning and at bedtime.    [provider]  DHEA 25 MG CAPS Take 25 mg by mouth daily.    [provider]  docusate sodium (DOK) 100 MG  capsule     [provider]  doxycycline (VIBRAMYCIN) 100 MG capsule Take 1 capsule (100 mg total) by mouth 2 (two) times daily for 7 days. 06/10/23 06/17/23  Maxwell Marion, PA-C  EPINEPHrine 0.3 mg/0.3 mL IJ SOAJ injection Inject 0.3 mg into the muscle as needed for anaphylaxis.    [provider]  fluticasone-salmeterol [place] INHUB) 500-50 MCG/ACT AEPB     [provider]  Fluticasone-Umeclidin-Vilant (TRELEGY ELLIPTA) 100-62.5-25 MCG/ACT AEPB Inhale 1 puff then rinse mouth, once daily 12/22/22   Jetty Duhamel D, MD  furosemide (LASIX) 20 MG tablet TAKE 1 TABLET(20 MG) BY MOUTH DAILY 02/12/23  Ronnald Nian, MD  gabapentin (NEURONTIN) 300 MG capsule TAKE 2 CAPSULES BY MOUTH EVERY MORNING, 1 CAPSULE MID DAY AND 2 CAPSULES AT BEDTIME 01/23/23   Kirsteins, Victorino Sparrow, MD  Glucosamine HCl-MSM (GLUCOSAMINE-MSM PO) Take 1 tablet by mouth in the morning and at bedtime.    [provider]  ibuprofen (ADVIL) 800 MG tablet     [provider]  Magnesium 250 MG TABS Take 250 mg by mouth daily.    [provider]  metoprolol succinate (TOPROL-XL) 25 MG 24 hr tablet TAKE 1 TABLET(25 MG) BY MOUTH DAILY 07/28/22   [name], Lisette Abu, MD  montelukast (SINGULAIR) 10 MG tablet TAKE 1 TABLET(10 MG) BY MOUTH DAILY 02/12/23   Ronnald Nian, MD  Multiple Vitamin (MULTIVITAMIN) tablet Take 1 tablet by mouth 2 (two) times daily.     [provider]  nitroGLYCERIN (NITROSTAT) 0.4 MG SL tablet Place 0.4 mg under the tongue every 5 (five) minutes as needed for chest pain.    [provider]  Omega-3 Fatty Acids (FISH OIL) 1200 MG CAPS Take 1,200 mg by mouth in the morning and at bedtime.    [provider]  Polyethylene Glycol 3350 (MIRALAX PO)     [provider]  traMADol (ULTRAM) 50 MG tablet TAKE 2 TABLETS BY MOUTH EVERY 6 HOURS AS NEEDED FOR PAIN 02/16/23   Jones Bales, NP  vitamin E 180 MG (400 UNITS) capsule Take 400 Units by  mouth in the morning and at bedtime.    [provider]    Family History Family History  Problem Relation Age of Onset   Pancreatic cancer Mother    Breast cancer Sister    Epilepsy Brother    Adrenal disorder Neg Hx     Social History Social History   Tobacco Use   Smoking status: Never   Smokeless tobacco: Never  Vaping Use   Vaping status: Never Used  Substance Use Topics   Alcohol use: No    Alcohol/week: 0.0 standard drinks of alcohol   Drug use: No     Allergies   Bee venom, Linaclotide, Molds & smuts, and Seasonal ic [cholestatin]   Review of Systems Review of Systems  Constitutional:  Negative for chills and fever.  HENT:  Negative for ear pain and sore throat.   Eyes:  Negative for pain and visual disturbance.  Respiratory:  Negative for cough and shortness of breath.   Cardiovascular:  Negative for chest pain and palpitations.  Gastrointestinal:  Negative for abdominal pain and vomiting.  Genitourinary:  Negative for dysuria and hematuria.  Musculoskeletal:  Negative for arthralgias and back pain.  Skin:  Positive for wound (Left medial malleolus). Negative for color change and rash.  Neurological:  Negative for seizures and syncope.  All other systems reviewed and are negative.    Physical Exam Triage Vital Signs ED Triage Vitals  Encounter Vitals Group     BP 06/15/23 1618 123/83     Systolic BP Percentile --      Diastolic BP Percentile --      Pulse Rate 06/15/23 1618 67     Resp 06/15/23 1618 16     Temp 06/15/23 1618 97.8 F (36.6 C)     Temp Source 06/15/23 1618 Oral     SpO2 06/15/23 1618 96 %     Weight --      Height --      Head Circumference --  Peak Flow --      Pain Score 06/15/23 1620 8     Pain Loc --      Pain Education --      Exclude from Growth Chart --    No data found.  Updated Vital Signs BP 123/83 (BP Location: Right Arm)   Pulse 67   Temp 97.8 F (36.6 C) (Oral)   Resp 16   SpO2 96%    Visual Acuity Right Eye Distance:   Left Eye Distance:   Bilateral Distance:    Right Eye Near:   Left Eye Near:    Bilateral Near:     Physical Exam Vitals and nursing note reviewed.  Constitutional:      General: He is not in acute distress.    Appearance: He is well-developed.  HENT:     Head: Normocephalic and atraumatic.  Eyes:     Conjunctiva/sclera: Conjunctivae normal.  Cardiovascular:     Rate and Rhythm: Normal rate and regular rhythm.     Heart sounds: No murmur heard. Pulmonary:     Effort: Pulmonary effort is normal. No respiratory distress.     Breath sounds: Normal breath sounds.  Abdominal:     Palpations: Abdomen is soft.     Tenderness: There is no abdominal tenderness.  Musculoskeletal:        General: No swelling.     Cervical back: Neck supple.       Feet:  Skin:    General: Skin is warm and dry.     Capillary Refill: Capillary refill takes less than 2 seconds.     Findings: Erythema (Left medial malleolus) present.  Neurological:     Mental Status: He is alert.  Psychiatric:        Mood and Affect: Mood normal.      UC Treatments / Results  Labs (all labs ordered are listed, but only abnormal results are displayed) Labs Reviewed - No data to display  EKG   Radiology No results found.  Procedures Procedures (including critical care time)  Medications Ordered in UC Medications - No data to display  Initial Impression / Assessment and Plan / UC Course  I have reviewed the triage vital signs and the nursing notes.  Pertinent labs & imaging results that were available during my care of the patient were reviewed by me and considered in my medical decision making (see chart for details).     Left leg cellulitis with chronic venous stasis ulceration of the left medial malleolus: Will change the patient's antibiotics to Cipro 500 mg twice daily x 14 days.  We have put a referral in for wound care and would like him to follow-up with  them as soon as possible.  He should also make a follow-up appointment with vascular surgery for both venous and arterial insufficiency studies.  Will have him use Xeroform directly on the wound and cover with a dry dressing.  He will need to change his daily.  It is okay for him to wash the wound with soap and water.  Return to urgent care if symptoms fail to improve or worsen. Final Clinical Impressions(s) / UC Diagnoses   Final diagnoses:  Non-pressure chronic ulcer of left ankle with fat layer exposed (HCC)  Venous insufficiency of both lower extremities  Left leg cellulitis     Discharge Instructions      Wash wound with soap and water daily. Apply Xeroform directly to the wound and cover with a dry  dressing, may use rolled gauze to hold in place Take Cipro 500 mg twice daily for 14 days Make follow-up appointment with wound care at [facility] if they do not contact you Call to make appointment with vascular surgery as listed Keep leg elevated above heart is much as possible during the day Return to urgent care if symptoms worsen or fail to improve   ED Prescriptions     Medication Sig Dispense Auth. Provider   ciprofloxacin (CIPRO) 500 MG tablet Take 1 tablet (500 mg total) by mouth 2 (two) times daily for 14 days. 20 tablet [place]is Martins, [place]      PDMP not reviewed this encounter.   Landis Martins, [place] 06/15/23 1709

## 2023-06-15 NOTE — Therapy (Signed)
OUTPATIENT PHYSICAL THERAPY TREATMENT NOTE   Patient Name: BRANDYN MCCARTHER MRN: [id number] DOB:[birth year], 72 y.o., male Today's Date: 06/15/2023  END OF SESSION:  PT End of Session - 06/15/23 1404     Visit Number 5    Number of Visits 9    Date for PT Re-Evaluation 07/28/23    Authorization Type [employer]    PT Start Time 1402    PT Stop Time 1444    PT Time Calculation (min) 42 min    Activity Tolerance Patient tolerated treatment well    Behavior During Therapy WFL for tasks assessed/performed               Past Medical History:  Diagnosis Date   A-fib (HCC)    Allergic rhinitis, cause unspecified    Allergy    Anxiety    Blood clot in vein 2002   left calf SUPERFICIAL CLOT REMOVED THEN PART OF VEIN REMOVED   Chronic headaches    COPD (chronic obstructive pulmonary disease) (HCC)    Cough    X 1 YEAR NO FEVER CLEAR SPUTUM   DDD (degenerative disc disease)    Depression    DJD (degenerative joint disease)    ALL THE WAY DOWN SPINE   Dropfoot    LEFT , NO BRACE WORN NOW   Dysrhythmia    a fib one time after hernia surgery   Emphysema of lung (HCC)    Extrinsic asthma, unspecified    GERD (gastroesophageal reflux disease)    OCC NO MEDS FOR   Hypertension    STOPPED HTN MEDS UNTIL 2011, THEN HAD WEIGHT LOSS, NO MEDS SINCE   Neuromuscular disorder (HCC)    neuropathy  legs and feet   Neuropathy    POLYNEUOPATHOPATHY FEET AND LEGS   Peripheral vascular disease (HCC)    VARICOSE VEINS LEFT LEG   Pneumonia AS CHILD   PONV (postoperative nausea and vomiting)    Syncope    Ulnar nerve entrapment at elbow, right    Umbilical hernia    Unspecified asthma(493.90)    Past Surgical History:  Procedure Laterality Date   COLONSCOPY  06/16/2017   HERNIA REPAIR     INGUINAL HERNIA REPAIR Bilateral 07/20/2017   Procedure: LAPAROSCOPIC BILATERAL INGUINAL HERNIA REPAIR WITH MESH, UMBILICAL HERNIA REPAIR AND LYSIS OF ADHESIONS;  Surgeon: [name], De Blanch,  MD;  Location: [facility];  Service: General;  Laterality: Bilateral;   LAPAROSCOPY N/A 07/20/2017   Procedure: LAPAROSCOPY DIAGNOSTIC;  Surgeon: Rodman Pickle, MD;  Location: [facility];  Service: General;  Laterality: N/A;   QUADRICEPS TENDON REPAIR Right 08/07/2020   Procedure: REPAIR QUADRICEP TENDON;  Surgeon: Sheral Apley, MD;  Location: [facility];  Service: Orthopedics;  Laterality: Right;  NEED RAPID TEST;SAME DAY WORKUP; COMING FROM [facility]   QUADRICEPS TENDON REPAIR Right 02/26/2021   Procedure: REPAIR QUADRICEP TENDON;  Surgeon: Sheral Apley, MD;  Location: [facility];  Service: Orthopedics;  Laterality: Right;   ROTATOR CUFF REPAIR Left 2005   detached; left shoulder-reattached   SHOULDER SURGERY Right    laBRAL  tendon torn   SUPERFICIAL LEFT LEG VEIN STRIPPING WITH SMALL SUPERFICIAL CLOT  2002   ULNAR NERVE TRANSPOSITION Right 05/10/2018   Procedure: RIGHT ELBOW ULNAR NERVE RELEASE;  Surgeon: Bradly Bienenstock, MD;  Location: [facility];  Service: Orthopedics;  Laterality: Right;   UMBILICAL HERNIA REPAIR  02/18/2018   UMBILICAL HERNIA REPAIR N/A 02/18/2018   Procedure: LAPAROSCOPIC UMBILICAL HERNIA  REPAIR ERAS PATHWAY;  Surgeon: [name], De Blanch, MD;  Location: [facility];  Service: General;  Laterality: N/A;   Patient Active Problem List   Diagnosis Date Noted   Rupture of quadriceps tendon, right, sequela 02/26/2021   Rupture of right quadriceps tendon 08/07/2020   Ulnar neuropathy at elbow, right 05/03/2018   PAF (paroxysmal atrial fibrillation) (HCC) 02/20/2018   Chronic right shoulder pain 05/15/2016   Right knee pain 05/15/2016   Adrenal adenoma 02/20/2016   Chronic pain syndrome 10/12/2015   Dysphagia, pharyngoesophageal phase 09/14/2014   Other chronic postoperative pain 01/16/2014   Allergy, insect bite 07/02/2012   GERD (gastroesophageal reflux disease) 03/25/2012   Injury to peroneal nerve 03/12/2012   Lumbosacral spondylosis 01/13/2012   Chronic  back pain 11/17/2011   Seasonal and perennial allergic rhinitis 12/26/2010    ONSET DATE: 05/07/2023   REFERRING DIAG: M54.16 (ICD-10-CM) - Lumbar radiculopathy R20.0 (ICD-10-CM) - Bilateral leg numbness  PCP: No PCP  REFERRING PROVIDER: Glendale Chard, DO   Rationale for Evaluation and Treatment: Rehabilitation  THERAPY DIAG:  Unsteadiness on feet  Other abnormalities of gait and mobility  History of falling  ONSET DATE: 05/07/2023  SUBJECTIVE:                                                                                                                                                                                           SUBJECTIVE STATEMENT:  Does not wish to continue use of E-stim to R quad. Has gotten more ROM in his R knee, but it feels unstable. Reports has more feeling in his R leg and foot now. Was recently seen in the ER for LLE cellulitis. Reports he is laying down constantly to elevate his legs. Got a new PCP through [facility], but they are not available to see pt until October.   PERTINENT HISTORY:  PMH: A-fib, anxiety, chronic headaches, COPD, emphysema, HTN, Longstanding history of idiopathic neuropathy , peripheral vascular disease, hx of R quadriceps tendon repair in 2021/2022  Asymmetric right leg numbness maybe due to right lumbar radiculopathy.   PAIN:  Are you having pain? Yes: NPRS scale: 3/10 for knee pain, 6/10 back pain, LLE 7/10 pain due to cellulitis  Pain location: R side of back, R knee, numbness down R leg  Pain description: Numbness, back is very tiring  Aggravating factors: Walking too far or getting around the house  Relieving factors: Exercises that he does at the gym that help his back   PRECAUTIONS: Fall   WEIGHT BEARING RESTRICTIONS: No  FALLS:  Has patient fallen in last 6 months? Yes. Number of falls 1 or 2  falls, can't recall how he fell, but knows it happens in the yard, can't remember the details   LIVING  ENVIRONMENT:  Has following equipment at home: Single point cane, Walker - 2 wheeled, and doesn't use RW as much, but it is there.   PLOF: Independent  PATIENT GOALS: Wants to improve the numbness in his legs   NEXT MD VISIT: Jacalyn Lefevre, Phys Med on 08/18/23   OBJECTIVE:   DIAGNOSTIC FINDINGS:  MRI lumbar spine: 04/12/23: IMPRESSION: 1. No acute findings or clear explanation for the patient's symptoms. 2. Chronic multilevel spondylosis associated with a convex right scoliosis, only minimally progressive from previous MRI from 2008. 3. Chronic left foraminal narrowing at L3-4 with probable chronic left L3 nerve root encroachment. 4. Chronic right-greater-than-left foraminal narrowing at L4-5 and L5-S1.  PATIENT SURVEYS:  Modified Oswestry Will provide at next session    COGNITION: Overall cognitive status: Within functional limits for tasks assessed     SENSATION: Light touch: Impaired  and due to neuropathy, more difficult to tell with RLE more distally, numbness down RLE Proprioception: WFL and able to detect at bilat ankles in PF/DF, but when tested on R foot, pt reports that it feels numb or an abnormal pain that it has to describe    POSTURE: rounded shoulders and posterior pelvic tilt Trunk flexed during gait    LOWER EXTREMITY ROM:     Limited R knee extension AROM due to hx of R quad tendon repairs   Active  Right eval Left eval Right 06/02/23 Active/passive  Hip flexion     Hip extension     Hip abduction     Hip adduction     Hip internal rotation     Hip external rotation     Knee flexion Limited, can only do about 90-95 degrees of knee flexion due to hx of quad tendon repairs  115/120  Knee extension   -25/0  Ankle dorsiflexion     Ankle plantarflexion     Ankle inversion     Ankle eversion      (Blank rows = not tested)  LOWER EXTREMITY MMT:    MMT Right eval Left eval  Hip flexion 3+ 4-  Hip extension    Hip abduction    Hip adduction     Hip internal rotation    Hip external rotation    Knee flexion 4 4+  Knee extension 3- (pain in the knee) 4-  Ankle dorsiflexion 4 (pt reports incr pain) 5  Ankle plantarflexion    Ankle inversion    Ankle eversion     (Blank rows = not tested)  GAIT: Gait pattern: decreased stance time- Right, knee flexed in stance- Right, antalgic, trunk flexed, and wide BOS Distance walked: Clinic distances  Assistive device utilized: Single point cane Level of assistance: SBA    TODAY'S TREATMENT:  06/08/23  Self-Care: Pt recently seen in the ER for LLE cellulitis. PT assessed pt's wound and was an open wound. Patient reports having recently seen an emergency department (ED) physician who, per patient report, was not concerned with his wound.  Patient does not currently have a primary care physician (PCP) and is in the process of changing PCPs, with the next available appointment being in October. Patient was advised about the risks of an open wound, including the potential for infection if not monitored and treated properly. The patient was informed of the importance of seeing a PCP for further evaluation, treatment, and monitoring of the wound. Although the patient was offered assistance in contacting the new PCP to expedite an appointment, he declined this offer and preferred not to follow up with his PCP through our office. Patient expressed a desire to continue with physical therapy for back pain and stated, "If it gets any worse, I will go to ED". During session, pt reporting continued pain in LLE where wound is present. Discussed if pt did not want PT to reach out regarding expediting his PCP appt, to go to the ER or Urgent Care for wound care management to make sure that it does not get worse. At end of session, pt in agreement to get his wound assessed at Urgent Care.     TherEx  Bridging x10 reps Hooklying hip ADD ball squeeze x10 reps  Lower trunk rotations with hip ADD ball squeeze x12 reps, moving within pt's available range  Posterior pelvic tilt with alternating marching x12 reps, max verbal and tactile cues for pelvic tilt  Seated hip ABD with red t-band x10 reps, performed an additional x10 reps with each leg  Alternating marching 2 x 10 reps with red t-band around thighs, cues for TA activation and slowed/controlled    PATIENT EDUCATION:  Education details: See Self-Care section above.  Person educated: Patient Education method: Explanation Education comprehension: verbalized understanding and needs further education  HOME EXERCISE PROGRAM: Access Code: [email or web address] URL: [email or web address] Date: 06/05/2023 Prepared by: Nedra Hai  Exercises - Supine Single Knee to Chest Stretch  - 2 x daily - 7 x weekly - 1 sets - 5 reps - 5-10 seconds  hold - Supine Bridge  - 1-2 x daily - 7 x weekly - 1 sets - 5-10 reps - 1 second hold - Supine Figure 4 Piriformis Stretch  - 2 x daily - 7 x weekly - 1 sets - 3 reps - 30 hold - Supine Posterior Pelvic Tilt  - 1-2 x daily - 7 x weekly - 1 sets - 10 reps - 2-3 seconds  hold   ASSESSMENT:  CLINICAL IMPRESSION:   See Self-Care section above for further details.    OBJECTIVE IMPAIRMENTS: Abnormal gait, decreased activity tolerance, decreased balance, decreased endurance, decreased knowledge of use of DME, decreased mobility, difficulty walking, decreased ROM, decreased strength, hypomobility, impaired sensation, postural dysfunction, and pain.   ACTIVITY LIMITATIONS: standing, squatting, stairs, transfers, and locomotion level  PARTICIPATION LIMITATIONS: community activity  PERSONAL FACTORS: Age, Behavior pattern, Past/current experiences, Time since onset of injury/illness/exacerbation, and 3+ comorbidities:  A-fib, anxiety, chronic headaches, COPD, emphysema, HTN, Longstanding  history of idiopathic neuropathy , peripheral vascular disease, hx of R quadriceps tendon repair in 2021/2022  are also affecting patient's functional outcome.   REHAB POTENTIAL: Good  CLINICAL DECISION MAKING: Evolving/moderate complexity  EVALUATION COMPLEXITY: Moderate   GOALS: Goals reviewed with patient? Yes  SHORT TERM GOALS: ALL STGS = LTGS  LONG TERM GOALS: Target date: 06/26/2023  Pt will be independent with final HEP in order to build upon functional gains made in therapy. Baseline:  Goal status: INITIAL  2.  Pt will report 10% improvement on ODI to improve overall function Baseline: 66% disability (06/08/11) Goal status: INITIAL  3.  Pt will demo >0.8 m/s gait speed without AD to improve community ambulation Baseline: 0.76 m/s without AD (06/08/23) Goal status: INITIAL  4.  Pt will demo <16 sec without UE support with 5x sit to stand to improve functional strength. Baseline: 21 sec (06/08/23) Goal status: INITIAL    PLAN:  PT FREQUENCY: 2x/week  PT DURATION: 8 weeks   PLANNED INTERVENTIONS: Therapeutic exercises, Therapeutic activity, Neuromuscular re-education, Balance training, Gait training, Patient/Family education, Self Care, Joint mobilization, Stair training, Orthotic/Fit training, DME instructions, Aquatic Therapy, Electrical stimulation, Spinal mobilization, Taping, Manual therapy, and Re-evaluation.  PLAN FOR NEXT SESSION: how is low back doing?   continue with core stability, hip strengthening, knee strengthening, low back exercises    Drake Leach, PT, DPT 06/15/2023, 2:55 PM

## 2023-06-18 ENCOUNTER — Encounter: Payer: Self-pay | Admitting: Physical Therapy

## 2023-06-18 ENCOUNTER — Ambulatory Visit: Payer: Medicare PPO | Admitting: Physical Therapy

## 2023-06-18 DIAGNOSIS — R2681 Unsteadiness on feet: Secondary | ICD-10-CM

## 2023-06-18 DIAGNOSIS — M79604 Pain in right leg: Secondary | ICD-10-CM | POA: Diagnosis not present

## 2023-06-18 DIAGNOSIS — R2689 Other abnormalities of gait and mobility: Secondary | ICD-10-CM | POA: Diagnosis not present

## 2023-06-18 DIAGNOSIS — M5416 Radiculopathy, lumbar region: Secondary | ICD-10-CM | POA: Diagnosis not present

## 2023-06-18 DIAGNOSIS — R200 Anesthesia of skin: Secondary | ICD-10-CM | POA: Diagnosis not present

## 2023-06-18 DIAGNOSIS — R208 Other disturbances of skin sensation: Secondary | ICD-10-CM | POA: Diagnosis not present

## 2023-06-18 DIAGNOSIS — Z9181 History of falling: Secondary | ICD-10-CM | POA: Diagnosis not present

## 2023-06-18 NOTE — Therapy (Signed)
OUTPATIENT PHYSICAL THERAPY TREATMENT NOTE   Patient Name: Kristopher Gay MRN: [id number] DOB:14-Dec-1950, 72 y.o., male Today's Date: 06/18/2023  END OF SESSION:  PT End of Session - 06/18/23 1009     Visit Number 6    Number of Visits 9    Date for PT Re-Evaluation 07/28/23    Authorization Type [employer]    PT Start Time 1008    PT Stop Time 1052    PT Time Calculation (min) 44 min    Activity Tolerance Patient tolerated treatment well    Behavior During Therapy WFL for tasks assessed/performed               Past Medical History:  Diagnosis Date   A-fib (HCC)    Allergic rhinitis, cause unspecified    Allergy    Anxiety    Blood clot in vein 2002   left calf SUPERFICIAL CLOT REMOVED THEN PART OF VEIN REMOVED   Chronic headaches    COPD (chronic obstructive pulmonary disease) (HCC)    Cough    X 1 YEAR NO FEVER CLEAR SPUTUM   DDD (degenerative disc disease)    Depression    DJD (degenerative joint disease)    ALL THE WAY DOWN SPINE   Dropfoot    LEFT , NO BRACE WORN NOW   Dysrhythmia    a fib one time after hernia surgery   Emphysema of lung (HCC)    Extrinsic asthma, unspecified    GERD (gastroesophageal reflux disease)    OCC NO MEDS FOR   Hypertension    STOPPED HTN MEDS UNTIL 2011, THEN HAD WEIGHT LOSS, NO MEDS SINCE   Neuromuscular disorder (HCC)    neuropathy  legs and feet   Neuropathy    POLYNEUOPATHOPATHY FEET AND LEGS   Peripheral vascular disease (HCC)    VARICOSE VEINS LEFT LEG   Pneumonia AS CHILD   PONV (postoperative nausea and vomiting)    Syncope    Ulnar nerve entrapment at elbow, right    Umbilical hernia    Unspecified asthma(493.90)    Past Surgical History:  Procedure Laterality Date   COLONSCOPY  06/16/2017   HERNIA REPAIR     INGUINAL HERNIA REPAIR Bilateral 07/20/2017   Procedure: LAPAROSCOPIC BILATERAL INGUINAL HERNIA REPAIR WITH MESH, UMBILICAL HERNIA REPAIR AND LYSIS OF ADHESIONS;  Surgeon: [name], De Blanch,  MD;  Location: [facility];  Service: General;  Laterality: Bilateral;   LAPAROSCOPY N/A 07/20/2017   Procedure: LAPAROSCOPY DIAGNOSTIC;  Surgeon: Rodman Pickle, MD;  Location: [facility];  Service: General;  Laterality: N/A;   QUADRICEPS TENDON REPAIR Right 08/07/2020   Procedure: REPAIR QUADRICEP TENDON;  Surgeon: Sheral Apley, MD;  Location: [facility];  Service: Orthopedics;  Laterality: Right;  NEED RAPID TEST;SAME DAY WORKUP; COMING FROM [facility]   QUADRICEPS TENDON REPAIR Right 02/26/2021   Procedure: REPAIR QUADRICEP TENDON;  Surgeon: Sheral Apley, MD;  Location: [facility];  Service: Orthopedics;  Laterality: Right;   ROTATOR CUFF REPAIR Left 2005   detached; left shoulder-reattached   SHOULDER SURGERY Right    laBRAL  tendon torn   SUPERFICIAL LEFT LEG VEIN STRIPPING WITH SMALL SUPERFICIAL CLOT  2002   ULNAR NERVE TRANSPOSITION Right 05/10/2018   Procedure: RIGHT ELBOW ULNAR NERVE RELEASE;  Surgeon: Bradly Bienenstock, MD;  Location: [facility];  Service: Orthopedics;  Laterality: Right;   UMBILICAL HERNIA REPAIR  02/18/2018   UMBILICAL HERNIA REPAIR N/A 02/18/2018   Procedure: LAPAROSCOPIC UMBILICAL HERNIA  REPAIR ERAS PATHWAY;  Surgeon: [name], De Blanch, MD;  Location: [facility];  Service: General;  Laterality: N/A;   Patient Active Problem List   Diagnosis Date Noted   Rupture of quadriceps tendon, right, sequela 02/26/2021   Rupture of right quadriceps tendon 08/07/2020   Ulnar neuropathy at elbow, right 05/03/2018   PAF (paroxysmal atrial fibrillation) (HCC) 02/20/2018   Chronic right shoulder pain 05/15/2016   Right knee pain 05/15/2016   Adrenal adenoma 02/20/2016   Chronic pain syndrome 10/12/2015   Dysphagia, pharyngoesophageal phase 09/14/2014   Other chronic postoperative pain 01/16/2014   Allergy, insect bite 07/02/2012   GERD (gastroesophageal reflux disease) 03/25/2012   Injury to peroneal nerve 03/12/2012   Lumbosacral spondylosis 01/13/2012   Chronic  back pain 11/17/2011   Seasonal and perennial allergic rhinitis 12/26/2010    ONSET DATE: 05/07/2023   REFERRING DIAG: M54.16 (ICD-10-CM) - Lumbar radiculopathy R20.0 (ICD-10-CM) - Bilateral leg numbness  PCP: No PCP  REFERRING PROVIDER: Glendale Chard, DO   Rationale for Evaluation and Treatment: Rehabilitation  THERAPY DIAG:  Unsteadiness on feet  Other abnormalities of gait and mobility  ONSET DATE: 05/07/2023  SUBJECTIVE:                                                                                                                                                                                           SUBJECTIVE STATEMENT:  Saw Urgent Care the other day after his PT appt for his chronic venous stasis ulceration of the left medial malleolus. Was prescribed Cipro to take 2x daily and given Xeroform to apply directly to the wound. Also instructed to make follow-up appt with wound care. Reports L leg is feeling better. Has been elevating his leg. Wants to keep working on his exercises for his back today.   PERTINENT HISTORY:  PMH: A-fib, anxiety, chronic headaches, COPD, emphysema, HTN, Longstanding history of idiopathic neuropathy , peripheral vascular disease, hx of R quadriceps tendon repair in 2021/2022  Asymmetric right leg numbness maybe due to right lumbar radiculopathy.   PAIN:  Are you having pain? Yes: NPRS scale: "everything else feels pretty good", LLE 6/10 pain due to cellulitis  Pain location: R side of back, R knee, numbness down R leg  Pain description: Numbness, back is very tiring  Aggravating factors: Walking too far or getting around the house  Relieving factors: Exercises that he does at the gym that help his back   PRECAUTIONS: Fall   WEIGHT BEARING RESTRICTIONS: No  FALLS:  Has patient fallen in last 6 months? Yes. Number of falls 1 or 2 falls, can't recall how he fell, but  knows it happens in the yard, can't remember the details   LIVING  ENVIRONMENT:  Has following equipment at home: Single point cane, Walker - 2 wheeled, and doesn't use RW as much, but it is there.   PLOF: Independent  PATIENT GOALS: Wants to improve the numbness in his legs   NEXT MD VISIT: Jacalyn Lefevre, Phys Med on 08/18/23   OBJECTIVE:   DIAGNOSTIC FINDINGS:  MRI lumbar spine: 04/12/23: IMPRESSION: 1. No acute findings or clear explanation for the patient's symptoms. 2. Chronic multilevel spondylosis associated with a convex right scoliosis, only minimally progressive from previous MRI from 2008. 3. Chronic left foraminal narrowing at L3-4 with probable chronic left L3 nerve root encroachment. 4. Chronic right-greater-than-left foraminal narrowing at L4-5 and L5-S1.  PATIENT SURVEYS:  Modified Oswestry Will provide at next session    COGNITION: Overall cognitive status: Within functional limits for tasks assessed     SENSATION: Light touch: Impaired  and due to neuropathy, more difficult to tell with RLE more distally, numbness down RLE Proprioception: WFL and able to detect at bilat ankles in PF/DF, but when tested on R foot, pt reports that it feels numb or an abnormal pain that it has to describe    POSTURE: rounded shoulders and posterior pelvic tilt Trunk flexed during gait    LOWER EXTREMITY ROM:     Limited R knee extension AROM due to hx of R quad tendon repairs   Active  Right eval Left eval Right 06/02/23 Active/passive  Hip flexion     Hip extension     Hip abduction     Hip adduction     Hip internal rotation     Hip external rotation     Knee flexion Limited, can only do about 90-95 degrees of knee flexion due to hx of quad tendon repairs  115/120  Knee extension   -25/0  Ankle dorsiflexion     Ankle plantarflexion     Ankle inversion     Ankle eversion      (Blank rows = not tested)  LOWER EXTREMITY MMT:    MMT Right eval Left eval  Hip flexion 3+ 4-  Hip extension    Hip abduction    Hip adduction     Hip internal rotation    Hip external rotation    Knee flexion 4 4+  Knee extension 3- (pain in the knee) 4-  Ankle dorsiflexion 4 (pt reports incr pain) 5  Ankle plantarflexion    Ankle inversion    Ankle eversion     (Blank rows = not tested)  GAIT: Gait pattern: decreased stance time- Right, knee flexed in stance- Right, antalgic, trunk flexed, and wide BOS Distance walked: Clinic distances  Assistive device utilized: Single point cane Level of assistance: SBA    TODAY'S TREATMENT:  06/18/23  TherEx  Attempted SciFit with BUE/BLE at Gear 1.0 for ROM, strength, performed for 1 minute with pt unable to tolerate due to L wound and R knee hurting. Instead performed an additional 3 minutes with BUE only as pt reports that this felt good for his back as an aerobic warmup/ROM. Performed for 5 minutes.   Physioball roll outs in forwards direction x10 reps, then to R and L direction x12 reps each side, cued to slow down pace for a stretch Pressing into physioball for TA/core activation and slow marching 2 x 10 reps  Seated hamstring stretch with RLE only 2 x 30 seconds, pt unable to tolerate with LLE due to wound on ankle  Sidelying clamshells 2 x 10 reps each side with cues for technique and alignment Asked about prone position, but pt reports that he can't lay in this position due to his R knee  Single knee to chest stretch 2 x 30 seconds bilat, discussed holding for 30 seconds at home, an additional x30 seconds each side with bias to opposite shoulder x30 seconds   PATIENT EDUCATION:  Education details: Continue HEP, reaching out to wound-care, added clamshells to HEP, scheduling one more appt (pt had to miss one) Person educated: Patient Education method: Explanation, Demonstration, Verbal cues, and Handouts Education comprehension: verbalized  understanding and needs further education  HOME EXERCISE PROGRAM: Access Code: [email or web address] URL: [email or web address] Date: 06/18/2023 Prepared by: Sherlie Ban  Exercises - Supine Single Knee to Chest Stretch  - 2 x daily - 7 x weekly - 1 sets - 3 reps - 30 hold - Supine Bridge  - 1-2 x daily - 7 x weekly - 1 sets - 5-10 reps - 1 second hold - Supine Figure 4 Piriformis Stretch  - 2 x daily - 7 x weekly - 1 sets - 3 reps - 30 hold - Supine Posterior Pelvic Tilt  - 1-2 x daily - 7 x weekly - 1 sets - 10 reps - 2-3 seconds  hold - Clamshell  - 2 x daily - 7 x weekly - 2 sets - 10 reps   ASSESSMENT:  CLINICAL IMPRESSION: Today's skilled session focused on core exercises and low back stretches. Attempted SciFit at start of session, but pt unable to tolerate due to L ankle wound and R knee. Only performed with BUE for a few minutes as a warm-up. Pt able to tolerate supine and seated exercises, with pt reporting back felt better at end of session. Encouraged pt to follow-up with wound care, per Urgent Care instructions. Will continue per POC.    OBJECTIVE IMPAIRMENTS: Abnormal gait, decreased activity tolerance, decreased balance, decreased endurance, decreased knowledge of use of DME, decreased mobility, difficulty walking, decreased ROM, decreased strength, hypomobility, impaired sensation, postural dysfunction, and pain.   ACTIVITY LIMITATIONS: standing, squatting, stairs, transfers, and locomotion level  PARTICIPATION LIMITATIONS: community activity  PERSONAL FACTORS: Age, Behavior pattern, Past/current experiences, Time since onset of injury/illness/exacerbation, and 3+ comorbidities:  A-fib, anxiety, chronic headaches, COPD, emphysema, HTN, Longstanding history of idiopathic neuropathy , peripheral vascular disease, hx of R quadriceps tendon repair in 2021/2022  are also affecting patient's functional outcome.   REHAB POTENTIAL: Good  CLINICAL DECISION MAKING:  Evolving/moderate complexity  EVALUATION COMPLEXITY: Moderate   GOALS: Goals reviewed with patient? Yes  SHORT TERM GOALS: ALL STGS = LTGS  LONG TERM GOALS: Target date: 06/26/2023  Pt will be independent with final HEP in order to build upon functional gains made in therapy. Baseline:  Goal status: INITIAL  2.  Pt will report 10% improvement on ODI to improve overall function Baseline: 66% disability (06/08/11) Goal status: INITIAL  3.  Pt will demo >0.8 m/s gait speed without AD to improve community ambulation Baseline: 0.76 m/s without AD (06/08/23) Goal status: INITIAL  4.  Pt will demo <16 sec without UE support with 5x sit to stand to improve functional strength. Baseline: 21 sec (06/08/23) Goal status: INITIAL    PLAN:  PT FREQUENCY: 2x/week  PT DURATION: 8 weeks   PLANNED INTERVENTIONS: Therapeutic exercises, Therapeutic activity, Neuromuscular re-education, Balance training, Gait training, Patient/Family education, Self Care, Joint mobilization, Stair training, Orthotic/Fit training, DME instructions, Aquatic Therapy, Electrical stimulation, Spinal mobilization, Taping, Manual therapy, and Re-evaluation.  PLAN FOR NEXT SESSION: how is low back doing and wound?    continue with core stability, hip strengthening, knee strengthening, low back exercises    Drake Leach, PT, DPT 06/18/2023, 10:59 AM

## 2023-06-23 ENCOUNTER — Ambulatory Visit: Payer: Medicare PPO | Admitting: Physical Therapy

## 2023-06-23 ENCOUNTER — Encounter: Payer: Self-pay | Admitting: Physical Therapy

## 2023-06-23 DIAGNOSIS — R200 Anesthesia of skin: Secondary | ICD-10-CM | POA: Diagnosis not present

## 2023-06-23 DIAGNOSIS — R2681 Unsteadiness on feet: Secondary | ICD-10-CM

## 2023-06-23 DIAGNOSIS — Z9181 History of falling: Secondary | ICD-10-CM | POA: Diagnosis not present

## 2023-06-23 DIAGNOSIS — R208 Other disturbances of skin sensation: Secondary | ICD-10-CM | POA: Diagnosis not present

## 2023-06-23 DIAGNOSIS — M5416 Radiculopathy, lumbar region: Secondary | ICD-10-CM | POA: Diagnosis not present

## 2023-06-23 DIAGNOSIS — R2689 Other abnormalities of gait and mobility: Secondary | ICD-10-CM

## 2023-06-23 DIAGNOSIS — M79604 Pain in right leg: Secondary | ICD-10-CM | POA: Diagnosis not present

## 2023-06-23 NOTE — Therapy (Signed)
OUTPATIENT PHYSICAL THERAPY TREATMENT NOTE   Patient Name: THADEN ABAJIAN MRN: [id number] DOB:1951-01-11, 72 y.o., male Today's Date: 06/24/2023  END OF SESSION:  PT End of Session - 06/23/23 1450     Visit Number 7    Number of Visits 9    Date for PT Re-Evaluation 07/28/23    Authorization Type [employer]    PT Start Time 1448    PT Stop Time 1528    PT Time Calculation (min) 40 min    Activity Tolerance Patient tolerated treatment well    Behavior During Therapy WFL for tasks assessed/performed               Past Medical History:  Diagnosis Date   A-fib (HCC)    Allergic rhinitis, cause unspecified    Allergy    Anxiety    Blood clot in vein 2002   left calf SUPERFICIAL CLOT REMOVED THEN PART OF VEIN REMOVED   Chronic headaches    COPD (chronic obstructive pulmonary disease) (HCC)    Cough    X 1 YEAR NO FEVER CLEAR SPUTUM   DDD (degenerative disc disease)    Depression    DJD (degenerative joint disease)    ALL THE WAY DOWN SPINE   Dropfoot    LEFT , NO BRACE WORN NOW   Dysrhythmia    a fib one time after hernia surgery   Emphysema of lung (HCC)    Extrinsic asthma, unspecified    GERD (gastroesophageal reflux disease)    OCC NO MEDS FOR   Hypertension    STOPPED HTN MEDS UNTIL 2011, THEN HAD WEIGHT LOSS, NO MEDS SINCE   Neuromuscular disorder (HCC)    neuropathy  legs and feet   Neuropathy    POLYNEUOPATHOPATHY FEET AND LEGS   Peripheral vascular disease (HCC)    VARICOSE VEINS LEFT LEG   Pneumonia AS CHILD   PONV (postoperative nausea and vomiting)    Syncope    Ulnar nerve entrapment at elbow, right    Umbilical hernia    Unspecified asthma(493.90)    Past Surgical History:  Procedure Laterality Date   COLONSCOPY  06/16/2017   HERNIA REPAIR     INGUINAL HERNIA REPAIR Bilateral 07/20/2017   Procedure: LAPAROSCOPIC BILATERAL INGUINAL HERNIA REPAIR WITH MESH, UMBILICAL HERNIA REPAIR AND LYSIS OF ADHESIONS;  Surgeon: [name], De Blanch,  MD;  Location: [facility];  Service: General;  Laterality: Bilateral;   LAPAROSCOPY N/A 07/20/2017   Procedure: LAPAROSCOPY DIAGNOSTIC;  Surgeon: Rodman Pickle, MD;  Location: [facility];  Service: General;  Laterality: N/A;   QUADRICEPS TENDON REPAIR Right 08/07/2020   Procedure: REPAIR QUADRICEP TENDON;  Surgeon: Sheral Apley, MD;  Location: [facility];  Service: Orthopedics;  Laterality: Right;  NEED RAPID TEST;SAME DAY WORKUP; COMING FROM [facility]   QUADRICEPS TENDON REPAIR Right 02/26/2021   Procedure: REPAIR QUADRICEP TENDON;  Surgeon: Sheral Apley, MD;  Location: [facility];  Service: Orthopedics;  Laterality: Right;   ROTATOR CUFF REPAIR Left 2005   detached; left shoulder-reattached   SHOULDER SURGERY Right    laBRAL  tendon torn   SUPERFICIAL LEFT LEG VEIN STRIPPING WITH SMALL SUPERFICIAL CLOT  2002   ULNAR NERVE TRANSPOSITION Right 05/10/2018   Procedure: RIGHT ELBOW ULNAR NERVE RELEASE;  Surgeon: Bradly Bienenstock, MD;  Location: [facility];  Service: Orthopedics;  Laterality: Right;   UMBILICAL HERNIA REPAIR  02/18/2018   UMBILICAL HERNIA REPAIR N/A 02/18/2018   Procedure: LAPAROSCOPIC UMBILICAL HERNIA  REPAIR ERAS PATHWAY;  Surgeon: [name], De Blanch, MD;  Location: [facility];  Service: General;  Laterality: N/A;   Patient Active Problem List   Diagnosis Date Noted   Rupture of quadriceps tendon, right, sequela 02/26/2021   Rupture of right quadriceps tendon 08/07/2020   Ulnar neuropathy at elbow, right 05/03/2018   PAF (paroxysmal atrial fibrillation) (HCC) 02/20/2018   Chronic right shoulder pain 05/15/2016   Right knee pain 05/15/2016   Adrenal adenoma 02/20/2016   Chronic pain syndrome 10/12/2015   Dysphagia, pharyngoesophageal phase 09/14/2014   Other chronic postoperative pain 01/16/2014   Allergy, insect bite 07/02/2012   GERD (gastroesophageal reflux disease) 03/25/2012   Injury to peroneal nerve 03/12/2012   Lumbosacral spondylosis 01/13/2012   Chronic  back pain 11/17/2011   Seasonal and perennial allergic rhinitis 12/26/2010    ONSET DATE: 05/07/2023   REFERRING DIAG: M54.16 (ICD-10-CM) - Lumbar radiculopathy R20.0 (ICD-10-CM) - Bilateral leg numbness  PCP: No PCP  REFERRING PROVIDER: Glendale Chard, DO   Rationale for Evaluation and Treatment: Rehabilitation  THERAPY DIAG:  Unsteadiness on feet  Other abnormalities of gait and mobility  ONSET DATE: 05/07/2023  SUBJECTIVE:                                                                                                                                                                                           SUBJECTIVE STATEMENT:  Reports the wound on his L ankle is feeling better. Reports nothing else is new. Notices low back is about the same since starting therapy. Thinks he just has to keep working on the back exercises at home.   PERTINENT HISTORY:  PMH: A-fib, anxiety, chronic headaches, COPD, emphysema, HTN, Longstanding history of idiopathic neuropathy , peripheral vascular disease, hx of R quadriceps tendon repair in 2021/2022  Asymmetric right leg numbness maybe due to right lumbar radiculopathy.   PAIN:  Are you having pain? Yes: NPRS scale: "everything else feels pretty good", LLE 5/10 pain due to cellulitis  Pain location: R side of back, R knee, numbness down R leg  Pain description: Numbness, back is very tiring  Aggravating factors: Walking too far or getting around the house  Relieving factors: Exercises that he does at the gym that help his back   PRECAUTIONS: Fall   WEIGHT BEARING RESTRICTIONS: No  FALLS:  Has patient fallen in last 6 months? Yes. Number of falls 1 or 2 falls, can't recall how he fell, but knows it happens in the yard, can't remember the details   LIVING ENVIRONMENT:  Has following equipment at home: Single point cane, Walker - 2 wheeled, and doesn't use  RW as much, but it is there.   PLOF: Independent  PATIENT GOALS: Wants  to improve the numbness in his legs   NEXT MD VISIT: Jacalyn Lefevre, Phys Med on 08/18/23   OBJECTIVE:   DIAGNOSTIC FINDINGS:  MRI lumbar spine: 04/12/23: IMPRESSION: 1. No acute findings or clear explanation for the patient's symptoms. 2. Chronic multilevel spondylosis associated with a convex right scoliosis, only minimally progressive from previous MRI from 2008. 3. Chronic left foraminal narrowing at L3-4 with probable chronic left L3 nerve root encroachment. 4. Chronic right-greater-than-left foraminal narrowing at L4-5 and L5-S1.  PATIENT SURVEYS:  Modified Oswestry Will provide at next session    COGNITION: Overall cognitive status: Within functional limits for tasks assessed     SENSATION: Light touch: Impaired  and due to neuropathy, more difficult to tell with RLE more distally, numbness down RLE Proprioception: WFL and able to detect at bilat ankles in PF/DF, but when tested on R foot, pt reports that it feels numb or an abnormal pain that it has to describe    POSTURE: rounded shoulders and posterior pelvic tilt Trunk flexed during gait    LOWER EXTREMITY ROM:     Limited R knee extension AROM due to hx of R quad tendon repairs   Active  Right eval Left eval Right 06/02/23 Active/passive  Hip flexion     Hip extension     Hip abduction     Hip adduction     Hip internal rotation     Hip external rotation     Knee flexion Limited, can only do about 90-95 degrees of knee flexion due to hx of quad tendon repairs  115/120  Knee extension   -25/0  Ankle dorsiflexion     Ankle plantarflexion     Ankle inversion     Ankle eversion      (Blank rows = not tested)  LOWER EXTREMITY MMT:    MMT Right eval Left eval  Hip flexion 3+ 4-  Hip extension    Hip abduction    Hip adduction    Hip internal rotation    Hip external rotation    Knee flexion 4 4+  Knee extension 3- (pain in the knee) 4-  Ankle dorsiflexion 4 (pt reports incr pain) 5  Ankle  plantarflexion    Ankle inversion    Ankle eversion     (Blank rows = not tested)  GAIT: Gait pattern: decreased stance time- Right, knee flexed in stance- Right, antalgic, trunk flexed, and wide BOS Distance walked: Clinic distances  Assistive device utilized: Single point cane Level of assistance: SBA    TODAY'S TREATMENT:                                                                                                                               06/23/23  TherEx  Standing at countertop: Wide BOS, forward fold stretch with shifting hips posteriorly and then bringing hips  anteriorly x12 reps Trunk rotations x10 reps each side Pt reporting feeling good from performing these in standing, will plan to add to HEP  Seated side bending stretch for QL x12 reps each side Seated cat/cow x10 reps  Seated figure 4 stretch 2 x 30 seconds each side  Seated on green balance disc for core activation, cues to gently pull belly button into spine for TA activation:  10 reps marching each side with slow and controlled movements Then progressed to trying to lift contralateral arm over head, pt with difficulty coordinating, tendency to just lift ipsilateral arm x10 reps Alternating LAQs x10 reps each side  Gentle sit ups with holding 6# medicine ball x10 reps, then an additional 10 reps with press forward with medicine ball when leaning backwards x10 reps and bringing into chest  Trunk rotations holding 6# medicine ball x10 reps each side    PATIENT EDUCATION:  Education details: Additions to HEP for low back - exercises that pt liked that he could perform in seated or standing  Person educated: Patient Education method: Explanation, Demonstration, Verbal cues, and Handouts Education comprehension: verbalized understanding and needs further education  HOME EXERCISE PROGRAM: Access Code: [email or web address] URL: [email or web address] Date: 06/23/2023 Prepared by: Sherlie Ban  Exercises - Supine Single Knee to Chest Stretch  - 2 x daily - 7 x weekly - 1 sets - 3 reps - 30 hold - Supine Bridge  - 1-2 x daily - 7 x weekly - 1 sets - 5-10 reps - 1 second hold - Supine Figure 4 Piriformis Stretch  - 2 x daily - 7 x weekly - 1 sets - 3 reps - 30 hold - Supine Posterior Pelvic Tilt  - 1-2 x daily - 7 x weekly - 1 sets - 10 reps - 2-3 seconds  hold - Clamshell  - 2 x daily - 7 x weekly - 2 sets - 10 reps - Standing Lumbar Spine Flexion Stretch Counter  - 1-2 x daily - 5 x weekly - 2 sets - 10 reps - Seated Quadratus Lumborum Stretch in Chair  - 1-2 x daily - 5 x weekly - 2 sets - 10 reps - Thoracic Rotation at Counter  - 1-2 x daily - 5 x weekly - 2 sets - 10 reps   ASSESSMENT:  CLINICAL IMPRESSION: Today's skilled session focused on core exercises and low back stretches in seated and standing. Pt reporting feeling good with exercises in standing/seated, so added to pt's HEP (as pt currently only has exercises in supine for home). Pt challenged by core exercises on balance disc today for TA activation. Will continue per POC.    OBJECTIVE IMPAIRMENTS: Abnormal gait, decreased activity tolerance, decreased balance, decreased endurance, decreased knowledge of use of DME, decreased mobility, difficulty walking, decreased ROM, decreased strength, hypomobility, impaired sensation, postural dysfunction, and pain.   ACTIVITY LIMITATIONS: standing, squatting, stairs, transfers, and locomotion level  PARTICIPATION LIMITATIONS: community activity  PERSONAL FACTORS: Age, Behavior pattern, Past/current experiences, Time since onset of injury/illness/exacerbation, and 3+ comorbidities:  A-fib, anxiety, chronic headaches, COPD, emphysema, HTN, Longstanding history of idiopathic neuropathy , peripheral vascular disease, hx of R quadriceps tendon repair in 2021/2022  are also affecting patient's functional outcome.   REHAB POTENTIAL: Good  CLINICAL DECISION MAKING:  Evolving/moderate complexity  EVALUATION COMPLEXITY: Moderate   GOALS: Goals reviewed with patient? Yes  SHORT TERM GOALS: ALL STGS = LTGS  LONG TERM GOALS: Target date: 06/26/2023  Pt will be independent with final HEP in  order to build upon functional gains made in therapy. Baseline:  Goal status: INITIAL  2.  Pt will report 10% improvement on ODI to improve overall function Baseline: 66% disability (06/08/11) Goal status: INITIAL  3.  Pt will demo >0.8 m/s gait speed without AD to improve community ambulation Baseline: 0.76 m/s without AD (06/08/23) Goal status: INITIAL  4.  Pt will demo <16 sec without UE support with 5x sit to stand to improve functional strength. Baseline: 21 sec (06/08/23) Goal status: INITIAL    PLAN:  PT FREQUENCY: 2x/week  PT DURATION: 8 weeks   PLANNED INTERVENTIONS: Therapeutic exercises, Therapeutic activity, Neuromuscular re-education, Balance training, Gait training, Patient/Family education, Self Care, Joint mobilization, Stair training, Orthotic/Fit training, DME instructions, Aquatic Therapy, Electrical stimulation, Spinal mobilization, Taping, Manual therapy, and Re-evaluation.  PLAN FOR NEXT SESSION: how is low back doing and wound?    continue with core stability, hip strengthening, knee strengthening, low back exercises, likely    Drake Leach, PT, DPT 06/24/2023, 8:22 AM

## 2023-06-26 ENCOUNTER — Ambulatory Visit: Payer: Medicare PPO | Admitting: Physical Therapy

## 2023-06-26 ENCOUNTER — Encounter: Payer: Self-pay | Admitting: Physical Therapy

## 2023-06-26 DIAGNOSIS — M5416 Radiculopathy, lumbar region: Secondary | ICD-10-CM | POA: Diagnosis not present

## 2023-06-26 DIAGNOSIS — M79604 Pain in right leg: Secondary | ICD-10-CM | POA: Diagnosis not present

## 2023-06-26 DIAGNOSIS — Z9181 History of falling: Secondary | ICD-10-CM

## 2023-06-26 DIAGNOSIS — R2681 Unsteadiness on feet: Secondary | ICD-10-CM | POA: Diagnosis not present

## 2023-06-26 DIAGNOSIS — R200 Anesthesia of skin: Secondary | ICD-10-CM | POA: Diagnosis not present

## 2023-06-26 DIAGNOSIS — R208 Other disturbances of skin sensation: Secondary | ICD-10-CM | POA: Diagnosis not present

## 2023-06-26 DIAGNOSIS — R2689 Other abnormalities of gait and mobility: Secondary | ICD-10-CM

## 2023-06-26 NOTE — Therapy (Signed)
OUTPATIENT PHYSICAL THERAPY TREATMENT NOTE   Patient Name: Kristopher Gay MRN: [id number] DOB:1951-08-11, 72 y.o., male Today's Date: 06/26/2023  END OF SESSION:  PT End of Session - 06/26/23 1230     Visit Number 8    Number of Visits 16    Date for PT Re-Evaluation 07/28/23    Authorization Type [employer]    PT Start Time 1232    PT Stop Time 1311    PT Time Calculation (min) 39 min    Activity Tolerance Patient tolerated treatment well    Behavior During Therapy WFL for tasks assessed/performed                Past Medical History:  Diagnosis Date   A-fib (HCC)    Allergic rhinitis, cause unspecified    Allergy    Anxiety    Blood clot in vein 2002   left calf SUPERFICIAL CLOT REMOVED THEN PART OF VEIN REMOVED   Chronic headaches    COPD (chronic obstructive pulmonary disease) (HCC)    Cough    X 1 YEAR NO FEVER CLEAR SPUTUM   DDD (degenerative disc disease)    Depression    DJD (degenerative joint disease)    ALL THE WAY DOWN SPINE   Dropfoot    LEFT , NO BRACE WORN NOW   Dysrhythmia    a fib one time after hernia surgery   Emphysema of lung (HCC)    Extrinsic asthma, unspecified    GERD (gastroesophageal reflux disease)    OCC NO MEDS FOR   Hypertension    STOPPED HTN MEDS UNTIL 2011, THEN HAD WEIGHT LOSS, NO MEDS SINCE   Neuromuscular disorder (HCC)    neuropathy  legs and feet   Neuropathy    POLYNEUOPATHOPATHY FEET AND LEGS   Peripheral vascular disease (HCC)    VARICOSE VEINS LEFT LEG   Pneumonia AS CHILD   PONV (postoperative nausea and vomiting)    Syncope    Ulnar nerve entrapment at elbow, right    Umbilical hernia    Unspecified asthma(493.90)    Past Surgical History:  Procedure Laterality Date   COLONSCOPY  06/16/2017   HERNIA REPAIR     INGUINAL HERNIA REPAIR Bilateral 07/20/2017   Procedure: LAPAROSCOPIC BILATERAL INGUINAL HERNIA REPAIR WITH MESH, UMBILICAL HERNIA REPAIR AND LYSIS OF ADHESIONS;  Surgeon: [name], De Blanch, MD;  Location: [facility];  Service: General;  Laterality: Bilateral;   LAPAROSCOPY N/A 07/20/2017   Procedure: LAPAROSCOPY DIAGNOSTIC;  Surgeon: Rodman Pickle, MD;  Location: [facility];  Service: General;  Laterality: N/A;   QUADRICEPS TENDON REPAIR Right 08/07/2020   Procedure: REPAIR QUADRICEP TENDON;  Surgeon: Sheral Apley, MD;  Location: [facility];  Service: Orthopedics;  Laterality: Right;  NEED RAPID TEST;SAME DAY WORKUP; COMING FROM [facility]   QUADRICEPS TENDON REPAIR Right 02/26/2021   Procedure: REPAIR QUADRICEP TENDON;  Surgeon: Sheral Apley, MD;  Location: [facility];  Service: Orthopedics;  Laterality: Right;   ROTATOR CUFF REPAIR Left 2005   detached; left shoulder-reattached   SHOULDER SURGERY Right    laBRAL  tendon torn   SUPERFICIAL LEFT LEG VEIN STRIPPING WITH SMALL SUPERFICIAL CLOT  2002   ULNAR NERVE TRANSPOSITION Right 05/10/2018   Procedure: RIGHT ELBOW ULNAR NERVE RELEASE;  Surgeon: Bradly Bienenstock, MD;  Location: [facility];  Service: Orthopedics;  Laterality: Right;   UMBILICAL HERNIA REPAIR  02/18/2018   UMBILICAL HERNIA REPAIR N/A 02/18/2018   Procedure: LAPAROSCOPIC UMBILICAL  HERNIA REPAIR ERAS PATHWAY;  Surgeon: [name], De Blanch, MD;  Location[facility];  Service: General;  Laterality: N/A;   Patient Active Problem List   Diagnosis Date Noted   Rupture of quadriceps tendon, right, sequela 02/26/2021   Rupture of right quadriceps tendon 08/07/2020   Ulnar neuropathy at elbow, right 05/03/2018   PAF (paroxysmal atrial fibrillation) (HCC) 02/20/2018   Chronic right shoulder pain 05/15/2016   Right knee pain 05/15/2016   Adrenal adenoma 02/20/2016   Chronic pain syndrome 10/12/2015   Dysphagia, pharyngoesophageal phase 09/14/2014   Other chronic postoperative pain 01/16/2014   Allergy, insect bite 07/02/2012   GERD (gastroesophageal reflux disease) 03/25/2012   Injury to peroneal nerve 03/12/2012   Lumbosacral spondylosis 01/13/2012    Chronic back pain 11/17/2011   Seasonal and perennial allergic rhinitis 12/26/2010    ONSET DATE: 05/07/2023   REFERRING DIAG: M54.16 (ICD-10-CM) - Lumbar radiculopathy R20.0 (ICD-10-CM) - Bilateral leg numbness  PCP: No PCP  REFERRING PROVIDER: Glendale Chard, DO   Rationale for Evaluation and Treatment: Rehabilitation  THERAPY DIAG:  Unsteadiness on feet  Other abnormalities of gait and mobility  History of falling  Pain of right lower extremity  Other disturbances of skin sensation  ONSET DATE: 05/07/2023  SUBJECTIVE:                                                                                                                                                                                           SUBJECTIVE STATEMENT:  There's been different exercises that shouldn't have been hitting me hard but they are, it feels like a weakness issue. I feel this in the lower right back and the right knee. If I keep my ankle with the wound elevated, it doesn't swell too much, I don't have too much pain. Haven't been to the gym for about 2.5 weeks, don't want to bump the ankle on anything and don't feel like doing what I was doing. Would give myself about a "C" grade right now, ankle is a big piece of this as its keeping me from doing things that I should be doing.   PERTINENT HISTORY:  PMH: A-fib, anxiety, chronic headaches, COPD, emphysema, HTN, Longstanding history of idiopathic neuropathy , peripheral vascular disease, hx of R quadriceps tendon repair in 2021/2022  Asymmetric right leg numbness maybe due to right lumbar radiculopathy.   PAIN:  Are you having pain? Yes: NPRS scale: "everything else feels pretty good", wound 5/10 now, back and R LE 4-5/10 at rest Pain location: R side of back, R knee, numbness down R leg  Pain description: tired feeling and difficult to move  Aggravating factors: Walking too far or getting around the house  Relieving factors: Exercises that he  does at the gym that help his back   PRECAUTIONS: Fall, wound on L ankle    WEIGHT BEARING RESTRICTIONS: No  FALLS:  Has patient fallen in last 6 months? Yes. Number of falls 1 or 2 falls, can't recall how he fell, but knows it happens in the yard, can't remember the details   LIVING ENVIRONMENT:  Has following equipment at home: Single point cane, Walker - 2 wheeled, and doesn't use RW as much, but it is there.   PLOF: Independent  PATIENT GOALS: Wants to improve the numbness in his legs   NEXT MD VISIT: Jacalyn Lefevre, Phys Med on 08/18/23   OBJECTIVE:   DIAGNOSTIC FINDINGS:  MRI lumbar spine: 04/12/23: IMPRESSION: 1. No acute findings or clear explanation for the patient's symptoms. 2. Chronic multilevel spondylosis associated with a convex right scoliosis, only minimally progressive from previous MRI from 2008. 3. Chronic left foraminal narrowing at L3-4 with probable chronic left L3 nerve root encroachment. 4. Chronic right-greater-than-left foraminal narrowing at L4-5 and L5-S1.  PATIENT SURVEYS:  Modified Oswestry 06/26/23- Oswestry 27/50   COGNITION: Overall cognitive status: Within functional limits for tasks assessed     SENSATION: Light touch: Impaired  and due to neuropathy, more difficult to tell with RLE more distally, numbness down RLE Proprioception: WFL and able to detect at bilat ankles in PF/DF, but when tested on R foot, pt reports that it feels numb or an abnormal pain that it has to describe    POSTURE: rounded shoulders and posterior pelvic tilt Trunk flexed during gait    LOWER EXTREMITY ROM:     Limited R knee extension AROM due to hx of R quad tendon repairs   Active  Right eval Left eval Right 06/02/23 Active/passive Right 06/26/23 AROM   Hip flexion      Hip extension      Hip abduction      Hip adduction      Hip internal rotation      Hip external rotation      Knee flexion Limited, can only do about 90-95 degrees of knee flexion  due to hx of quad tendon repairs  115/120   Knee extension   -25/0   Ankle dorsiflexion      Ankle plantarflexion      Ankle inversion      Ankle eversion       (Blank rows = not tested)  LOWER EXTREMITY MMT:    MMT Right eval Left eval Right 06/26/23 Left 06/26/23  Hip flexion 3+ 4- 3 knee pain  4+  Hip extension      Hip abduction      Hip adduction      Hip internal rotation      Hip external rotation      Knee flexion 4 4+    Knee extension 3- (pain in the knee) 4- 4 -(knee pain) 4  Ankle dorsiflexion 4 (pt reports incr pain) 5    Ankle plantarflexion      Ankle inversion      Ankle eversion       (Blank rows = not tested)  GAIT: Gait pattern: decreased stance time- Right, knee flexed in stance- Right, antalgic, trunk flexed, and wide BOS Distance walked: Clinic distances  Assistive device utilized: Single point cane Level of assistance: SBA    TODAY'S TREATMENT:  06/26/23  Oswestry, MMT, 5xSTS, goal review, education on POC moving forward  TherEx  PPT x12 TA set + double bent leg raise (limited range) x12 Opposite UE/LE flexion x10 B + TA set supine  Seated TA set + OH press 6# med ball + LAQ x10 B Seated TA set + over the shoulder with 6# med ball + hip flexion x10 B                                                                                                                            06/23/23  TherEx  Standing at countertop: Wide BOS, forward fold stretch with shifting hips posteriorly and then bringing hips anteriorly x12 reps Trunk rotations x10 reps each side Pt reporting feeling good from performing these in standing, will plan to add to HEP  Seated side bending stretch for QL x12 reps each side Seated cat/cow x10 reps  Seated figure 4 stretch 2 x 30 seconds each side  Seated on green balance disc for core activation, cues to gently pull belly button into spine for TA activation:  10 reps marching each side with slow and controlled  movements Then progressed to trying to lift contralateral arm over head, pt with difficulty coordinating, tendency to just lift ipsilateral arm x10 reps Alternating LAQs x10 reps each side  Gentle sit ups with holding 6# medicine ball x10 reps, then an additional 10 reps with press forward with medicine ball when leaning backwards x10 reps and bringing into chest  Trunk rotations holding 6# medicine ball x10 reps each side    PATIENT EDUCATION:  Education details: Additions to HEP for low back - exercises that pt liked that he could perform in seated or standing  Person educated: Patient Education method: Explanation, Demonstration, Verbal cues, and Handouts Education comprehension: verbalized understanding and needs further education  HOME EXERCISE PROGRAM: Access Code: [email or web address] URL: [email or web address] Date: 06/23/2023 Prepared by: Sherlie Ban  Exercises - Supine Single Knee to Chest Stretch  - 2 x daily - 7 x weekly - 1 sets - 3 reps - 30 hold - Supine Bridge  - 1-2 x daily - 7 x weekly - 1 sets - 5-10 reps - 1 second hold - Supine Figure 4 Piriformis Stretch  - 2 x daily - 7 x weekly - 1 sets - 3 reps - 30 hold - Supine Posterior Pelvic Tilt  - 1-2 x daily - 7 x weekly - 1 sets - 10 reps - 2-3 seconds  hold - Clamshell  - 2 x daily - 7 x weekly - 2 sets - 10 reps - Standing Lumbar Spine Flexion Stretch Counter  - 1-2 x daily - 5 x weekly - 2 sets - 10 reps - Seated Quadratus Lumborum Stretch in Chair  - 1-2 x daily - 5 x weekly - 2 sets - 10 reps - Thoracic Rotation at Counter  - 1-2 x daily - 5 x weekly - 2 sets -  10 reps   ASSESSMENT:  CLINICAL IMPRESSION:  Focus on LTG check/progress note today. L LE/ankle wound has really been making his usual difficulties at the gym quite difficult and he has not been as active lately. MMT was somewhat improved, other objective measures have backslid a bit since time of eval but this also complicated by pain and  immobility from ankle wound. We will make every effort to further improve functional status with an extension of skilled PT services for an additional 4 weeks, will plan on another re-assessment at that time.    OBJECTIVE IMPAIRMENTS: Abnormal gait, decreased activity tolerance, decreased balance, decreased endurance, decreased knowledge of use of DME, decreased mobility, difficulty walking, decreased ROM, decreased strength, hypomobility, impaired sensation, postural dysfunction, and pain.   ACTIVITY LIMITATIONS: standing, squatting, stairs, transfers, and locomotion level  PARTICIPATION LIMITATIONS: community activity  PERSONAL FACTORS: Age, Behavior pattern, Past/current experiences, Time since onset of injury/illness/exacerbation, and 3+ comorbidities:  A-fib, anxiety, chronic headaches, COPD, emphysema, HTN, Longstanding history of idiopathic neuropathy , peripheral vascular disease, hx of R quadriceps tendon repair in 2021/2022  are also affecting patient's functional outcome.   REHAB POTENTIAL: Good  CLINICAL DECISION MAKING: Evolving/moderate complexity  EVALUATION COMPLEXITY: Moderate   GOALS: Goals reviewed with patient? Yes  SHORT TERM GOALS: ALL STGS = LTGS  LONG TERM GOALS: Target date: 07/24/23  Pt will be independent with final HEP in order to build upon functional gains made in therapy. Baseline:  Goal status: ONGOING 06/26/23  2.  Pt will report 10% improvement on ODI to improve overall function Baseline:  Goal status: ONGOING 06/26/23  3.  Pt will demo >0.8 m/s gait speed without AD to improve community ambulation Baseline: 0.76 m/s without AD (06/08/23) Goal status: ONGOING, DNT 06/26/23  4.  Pt will demo <16 sec without UE support with 5x sit to stand to improve functional strength. Baseline: 21 sec (06/08/23); 06/26/23- 40 seconds  Goal status: ONGOING 06/26/23    PLAN:  PT FREQUENCY: 2x/week  PT DURATION: 8 weeks   PLANNED INTERVENTIONS: Therapeutic  exercises, Therapeutic activity, Neuromuscular re-education, Balance training, Gait training, Patient/Family education, Self Care, Joint mobilization, Stair training, Orthotic/Fit training, DME instructions, Aquatic Therapy, Electrical stimulation, Spinal mobilization, Taping, Manual therapy, and Re-evaluation.  PLAN FOR NEXT SESSION: how is low back doing and wound?    continue with core stability, hip strengthening, knee strengthening, low back exercises  Nedra Hai, PT, DPT 06/26/23 3:13 PM

## 2023-06-30 ENCOUNTER — Ambulatory Visit: Payer: Medicare PPO | Attending: Neurology

## 2023-06-30 DIAGNOSIS — R2689 Other abnormalities of gait and mobility: Secondary | ICD-10-CM | POA: Insufficient documentation

## 2023-06-30 DIAGNOSIS — Z9181 History of falling: Secondary | ICD-10-CM | POA: Insufficient documentation

## 2023-06-30 DIAGNOSIS — M79604 Pain in right leg: Secondary | ICD-10-CM | POA: Diagnosis not present

## 2023-06-30 DIAGNOSIS — R2681 Unsteadiness on feet: Secondary | ICD-10-CM | POA: Diagnosis not present

## 2023-06-30 NOTE — Therapy (Signed)
OUTPATIENT PHYSICAL THERAPY TREATMENT NOTE   Patient Name: Kristopher Gay MRN: [id number] DOB:1950/12/03, 72 y.o., male Today's Date: 06/30/2023  END OF SESSION:  PT End of Session - 06/30/23 1121     Visit Number 9    Number of Visits 16    Date for PT Re-Evaluation 07/28/23    Authorization Type [employer]    PT Start Time 1100    PT Stop Time 1145    PT Time Calculation (min) 45 min    Activity Tolerance Patient tolerated treatment well    Behavior During Therapy WFL for tasks assessed/performed                Past Medical History:  Diagnosis Date   A-fib (HCC)    Allergic rhinitis, cause unspecified    Allergy    Anxiety    Blood clot in vein 2002   left calf SUPERFICIAL CLOT REMOVED THEN PART OF VEIN REMOVED   Chronic headaches    COPD (chronic obstructive pulmonary disease) (HCC)    Cough    X 1 YEAR NO FEVER CLEAR SPUTUM   DDD (degenerative disc disease)    Depression    DJD (degenerative joint disease)    ALL THE WAY DOWN SPINE   Dropfoot    LEFT , NO BRACE WORN NOW   Dysrhythmia    a fib one time after hernia surgery   Emphysema of lung (HCC)    Extrinsic asthma, unspecified    GERD (gastroesophageal reflux disease)    OCC NO MEDS FOR   Hypertension    STOPPED HTN MEDS UNTIL 2011, THEN HAD WEIGHT LOSS, NO MEDS SINCE   Neuromuscular disorder (HCC)    neuropathy  legs and feet   Neuropathy    POLYNEUOPATHOPATHY FEET AND LEGS   Peripheral vascular disease (HCC)    VARICOSE VEINS LEFT LEG   Pneumonia AS CHILD   PONV (postoperative nausea and vomiting)    Syncope    Ulnar nerve entrapment at elbow, right    Umbilical hernia    Unspecified asthma(493.90)    Past Surgical History:  Procedure Laterality Date   COLONSCOPY  06/16/2017   HERNIA REPAIR     INGUINAL HERNIA REPAIR Bilateral 07/20/2017   Procedure: LAPAROSCOPIC BILATERAL INGUINAL HERNIA REPAIR WITH MESH, UMBILICAL HERNIA REPAIR AND LYSIS OF ADHESIONS;  Surgeon: [name], De Blanch, MD;  Location: [facility];  Service: General;  Laterality: Bilateral;   LAPAROSCOPY N/A 07/20/2017   Procedure: LAPAROSCOPY DIAGNOSTIC;  Surgeon: Rodman Pickle, MD;  Location: [facility];  Service: General;  Laterality: N/A;   QUADRICEPS TENDON REPAIR Right 08/07/2020   Procedure: REPAIR QUADRICEP TENDON;  Surgeon: Sheral Apley, MD;  Location: [facility];  Service: Orthopedics;  Laterality: Right;  NEED RAPID TEST;SAME DAY WORKUP; COMING FROM [facility]   QUADRICEPS TENDON REPAIR Right 02/26/2021   Procedure: REPAIR QUADRICEP TENDON;  Surgeon: Sheral Apley, MD;  Location: [facility];  Service: Orthopedics;  Laterality: Right;   ROTATOR CUFF REPAIR Left 2005   detached; left shoulder-reattached   SHOULDER SURGERY Right    laBRAL  tendon torn   SUPERFICIAL LEFT LEG VEIN STRIPPING WITH SMALL SUPERFICIAL CLOT  2002   ULNAR NERVE TRANSPOSITION Right 05/10/2018   Procedure: RIGHT ELBOW ULNAR NERVE RELEASE;  Surgeon: Bradly Bienenstock, MD;  Location: [facility];  Service: Orthopedics;  Laterality: Right;   UMBILICAL HERNIA REPAIR  02/18/2018   UMBILICAL HERNIA REPAIR N/A 02/18/2018   Procedure: LAPAROSCOPIC UMBILICAL  HERNIA REPAIR ERAS PATHWAY;  Surgeon: [name], De Blanch, MD;  Location: [facility];  Service: General;  Laterality: N/A;   Patient Active Problem List   Diagnosis Date Noted   Rupture of quadriceps tendon, right, sequela 02/26/2021   Rupture of right quadriceps tendon 08/07/2020   Ulnar neuropathy at elbow, right 05/03/2018   PAF (paroxysmal atrial fibrillation) (HCC) 02/20/2018   Chronic right shoulder pain 05/15/2016   Right knee pain 05/15/2016   Adrenal adenoma 02/20/2016   Chronic pain syndrome 10/12/2015   Dysphagia, pharyngoesophageal phase 09/14/2014   Other chronic postoperative pain 01/16/2014   Allergy, insect bite 07/02/2012   GERD (gastroesophageal reflux disease) 03/25/2012   Injury to peroneal nerve 03/12/2012   Lumbosacral spondylosis 01/13/2012    Chronic back pain 11/17/2011   Seasonal and perennial allergic rhinitis 12/26/2010    ONSET DATE: 05/07/2023   REFERRING DIAG: M54.16 (ICD-10-CM) - Lumbar radiculopathy R20.0 (ICD-10-CM) - Bilateral leg numbness  PCP: No PCP  REFERRING PROVIDER: Glendale Chard, DO   Rationale for Evaluation and Treatment: Rehabilitation  THERAPY DIAG:  Unsteadiness on feet  Other abnormalities of gait and mobility  ONSET DATE: 05/07/2023  SUBJECTIVE:                                                                                                                                                                                           SUBJECTIVE STATEMENT:  Pt reports back is the most bothersome. Wound is healing.  PERTINENT HISTORY:  PMH: A-fib, anxiety, chronic headaches, COPD, emphysema, HTN, Longstanding history of idiopathic neuropathy , peripheral vascular disease, hx of R quadriceps tendon repair in 2021/2022  Asymmetric right leg numbness maybe due to right lumbar radiculopathy.   PAIN:  Are you having pain? Yes: NPRS scale: "everything else feels pretty good", wound 5/10 now, back and R LE 4-5/10 at rest Pain location: R side of back, R knee, numbness down R leg  Pain description: tired feeling and difficult to move  Aggravating factors: Walking too far or getting around the house  Relieving factors: Exercises that he does at the gym that help his back   PRECAUTIONS: Fall, wound on L ankle    WEIGHT BEARING RESTRICTIONS: No  FALLS:  Has patient fallen in last 6 months? Yes. Number of falls 1 or 2 falls, can't recall how he fell, but knows it happens in the yard, can't remember the details   LIVING ENVIRONMENT:  Has following equipment at home: Single point cane, Walker - 2 wheeled, and doesn't use RW as much, but it is there.   PLOF: Independent  PATIENT GOALS: Wants to improve the numbness  in his legs   NEXT MD VISIT: Jacalyn Lefevre, Phys Med on 08/18/23   OBJECTIVE:    DIAGNOSTIC FINDINGS:  MRI lumbar spine: 04/12/23: IMPRESSION: 1. No acute findings or clear explanation for the patient's symptoms. 2. Chronic multilevel spondylosis associated with a convex right scoliosis, only minimally progressive from previous MRI from 2008. 3. Chronic left foraminal narrowing at L3-4 with probable chronic left L3 nerve root encroachment. 4. Chronic right-greater-than-left foraminal narrowing at L4-5 and L5-S1.  PATIENT SURVEYS:  Modified Oswestry 06/26/23- Oswestry 27/50   COGNITION: Overall cognitive status: Within functional limits for tasks assessed     SENSATION: Light touch: Impaired  and due to neuropathy, more difficult to tell with RLE more distally, numbness down RLE Proprioception: WFL and able to detect at bilat ankles in PF/DF, but when tested on R foot, pt reports that it feels numb or an abnormal pain that it has to describe    POSTURE: rounded shoulders and posterior pelvic tilt Trunk flexed during gait    LOWER EXTREMITY ROM:     Limited R knee extension AROM due to hx of R quad tendon repairs   Active  Right eval Left eval Right 06/02/23 Active/passive Right 06/26/23 AROM   Hip flexion      Hip extension      Hip abduction      Hip adduction      Hip internal rotation      Hip external rotation      Knee flexion Limited, can only do about 90-95 degrees of knee flexion due to hx of quad tendon repairs  115/120   Knee extension   -25/0   Ankle dorsiflexion      Ankle plantarflexion      Ankle inversion      Ankle eversion       (Blank rows = not tested)  LOWER EXTREMITY MMT:    MMT Right eval Left eval Right 06/26/23 Left 06/26/23  Hip flexion 3+ 4- 3 knee pain  4+  Hip extension      Hip abduction      Hip adduction      Hip internal rotation      Hip external rotation      Knee flexion 4 4+    Knee extension 3- (pain in the knee) 4- 4 -(knee pain) 4  Ankle dorsiflexion 4 (pt reports incr pain) 5    Ankle  plantarflexion      Ankle inversion      Ankle eversion       (Blank rows = not tested)  GAIT: Gait pattern: decreased stance time- Right, knee flexed in stance- Right, antalgic, trunk flexed, and wide BOS Distance walked: Clinic distances  Assistive device utilized: Single point cane Level of assistance: SBA    TODAY'S TREATMENT:       06/30/23 Tender over L2-3 lumbar paraspinalis on R side 06/30/23 L sidelying with hips/knees 90/90: bil hip IR/ER: 10x, hip ER/IR with R pelvis elevation and depression: 10x, Isometric, eccentric and active contractions (variable with 10 reps) SL clamshells: 10x R and L, manually resisted DFM to R lumbar paraspinalis Standing lumbar extensions: with back against countertop: 10x Seated L lumbar lateral flexion: 5 x 20"  HEP issued for quadratus lumborum (modified previous HEP by having patient hold R arm rest with L hand to prevent lifting of R buttocks during L lateral lean) and added standing lumbar extensions. Pt educated on continuing to be self aware of standing tall when ambulating  to improve posture and facilitate core/lumbar stabilization.  06/26/23  Oswestry, MMT, 5xSTS, goal review, education on POC moving forward  TherEx L sidelying: bil LE IR and ER: 10x, R pelvis elevation PPT x12 TA set + double bent leg raise (limited range) x12 Opposite UE/LE flexion x10 B + TA set supine  Seated TA set + OH press 6# med ball + LAQ x10 B Seated TA set + over the shoulder with 6# med ball + hip flexion x10 B                                                                                                                            06/23/23  TherEx  Standing at countertop: Wide BOS, forward fold stretch with shifting hips posteriorly and then bringing hips anteriorly x12 reps Trunk rotations x10 reps each side Pt reporting feeling good from performing these in standing, will plan to add to HEP  Seated side bending stretch for QL x12 reps each  side Seated cat/cow x10 reps  Seated figure 4 stretch 2 x 30 seconds each side  Seated on green balance disc for core activation, cues to gently pull belly button into spine for TA activation:  10 reps marching each side with slow and controlled movements Then progressed to trying to lift contralateral arm over head, pt with difficulty coordinating, tendency to just lift ipsilateral arm x10 reps Alternating LAQs x10 reps each side  Gentle sit ups with holding 6# medicine ball x10 reps, then an additional 10 reps with press forward with medicine ball when leaning backwards x10 reps and bringing into chest  Trunk rotations holding 6# medicine ball x10 reps each side    PATIENT EDUCATION:  Education details: Additions to HEP for low back - exercises that pt liked that he could perform in seated or standing  Person educated: Patient Education method: Explanation, Demonstration, Verbal cues, and Handouts Education comprehension: verbalized understanding and needs further education  HOME EXERCISE PROGRAM: Access Code: [email or web address] URL: [email or web address] Date: 06/23/2023 Prepared by: Sherlie Ban  Exercises - Supine Single Knee to Chest Stretch  - 2 x daily - 7 x weekly - 1 sets - 3 reps - 30 hold - Supine Bridge  - 1-2 x daily - 7 x weekly - 1 sets - 5-10 reps - 1 second hold - Supine Figure 4 Piriformis Stretch  - 2 x daily - 7 x weekly - 1 sets - 3 reps - 30 hold - Supine Posterior Pelvic Tilt  - 1-2 x daily - 7 x weekly - 1 sets - 10 reps - 2-3 seconds  hold - Clamshell  - 2 x daily - 7 x weekly - 2 sets - 10 reps - Standing Lumbar Spine Flexion Stretch Counter  - 1-2 x daily - 5 x weekly - 2 sets - 10 reps - Seated Quadratus Lumborum Stretch in Chair  - 1-2 x daily - 5 x weekly - 2 sets - 10 reps -  Thoracic Rotation at Counter  - 1-2 x daily - 5 x weekly - 2 sets - 10 reps   ASSESSMENT:  CLINICAL IMPRESSION:  Patient reported decrease in his pain to 2-3/10 at end  of the session. Pt reported, "I can feel weight through my heels and neuropathy in my R foot seems to be better" and "I feel like I am standing up taller"   OBJECTIVE IMPAIRMENTS: Abnormal gait, decreased activity tolerance, decreased balance, decreased endurance, decreased knowledge of use of DME, decreased mobility, difficulty walking, decreased ROM, decreased strength, hypomobility, impaired sensation, postural dysfunction, and pain.   ACTIVITY LIMITATIONS: standing, squatting, stairs, transfers, and locomotion level  PARTICIPATION LIMITATIONS: community activity  PERSONAL FACTORS: Age, Behavior pattern, Past/current experiences, Time since onset of injury/illness/exacerbation, and 3+ comorbidities:  A-fib, anxiety, chronic headaches, COPD, emphysema, HTN, Longstanding history of idiopathic neuropathy , peripheral vascular disease, hx of R quadriceps tendon repair in 2021/2022  are also affecting patient's functional outcome.   REHAB POTENTIAL: Good  CLINICAL DECISION MAKING: Evolving/moderate complexity  EVALUATION COMPLEXITY: Moderate   GOALS: Goals reviewed with patient? Yes  SHORT TERM GOALS: ALL STGS = LTGS  LONG TERM GOALS: Target date: 07/24/23  Pt will be independent with final HEP in order to build upon functional gains made in therapy. Baseline:  Goal status: ONGOING 06/26/23  2.  Pt will report 10% improvement on ODI to improve overall function Baseline:  Goal status: ONGOING 06/26/23  3.  Pt will demo >0.8 m/s gait speed without AD to improve community ambulation Baseline: 0.76 m/s without AD (06/08/23) Goal status: ONGOING, DNT 06/26/23  4.  Pt will demo <16 sec without UE support with 5x sit to stand to improve functional strength. Baseline: 21 sec (06/08/23); 06/26/23- 40 seconds  Goal status: ONGOING 06/26/23    PLAN:  PT FREQUENCY: 2x/week  PT DURATION: 8 weeks   PLANNED INTERVENTIONS: Therapeutic exercises, Therapeutic activity, Neuromuscular  re-education, Balance training, Gait training, Patient/Family education, Self Care, Joint mobilization, Stair training, Orthotic/Fit training, DME instructions, Aquatic Therapy, Electrical stimulation, Spinal mobilization, Taping, Manual therapy, and Re-evaluation.  PLAN FOR NEXT SESSION: how is low back doing and wound?    continue with core stability, hip strengthening, knee strengthening, low back exercises   Ileana Ladd, PT 06/30/2023, 1:36 PM

## 2023-07-05 ENCOUNTER — Other Ambulatory Visit: Payer: Self-pay | Admitting: Internal Medicine

## 2023-07-05 ENCOUNTER — Other Ambulatory Visit: Payer: Self-pay | Admitting: Registered Nurse

## 2023-07-07 ENCOUNTER — Ambulatory Visit: Payer: Medicare PPO

## 2023-07-07 DIAGNOSIS — R2681 Unsteadiness on feet: Secondary | ICD-10-CM

## 2023-07-07 DIAGNOSIS — M79604 Pain in right leg: Secondary | ICD-10-CM | POA: Diagnosis not present

## 2023-07-07 DIAGNOSIS — Z9181 History of falling: Secondary | ICD-10-CM | POA: Diagnosis not present

## 2023-07-07 DIAGNOSIS — R2689 Other abnormalities of gait and mobility: Secondary | ICD-10-CM

## 2023-07-07 NOTE — Therapy (Signed)
OUTPATIENT PHYSICAL THERAPY TREATMENT NOTE 10 visit Progress Note   Patient Name: Kristopher Gay MRN: [id number] DOB:Mar 31, 1951, 72 y.o., male Today's Date: 07/07/2023  END OF SESSION:  PT End of Session - 07/07/23 0904     Visit Number 10    Number of Visits 16    Date for PT Re-Evaluation 07/28/23    Authorization Type [employer]    PT Start Time 0845    PT Stop Time 0930    PT Time Calculation (min) 45 min    Activity Tolerance Patient tolerated treatment well    Behavior During Therapy WFL for tasks assessed/performed                Past Medical History:  Diagnosis Date   A-fib (HCC)    Allergic rhinitis, cause unspecified    Allergy    Anxiety    Blood clot in vein 2002   left calf SUPERFICIAL CLOT REMOVED THEN PART OF VEIN REMOVED   Chronic headaches    COPD (chronic obstructive pulmonary disease) (HCC)    Cough    X 1 YEAR NO FEVER CLEAR SPUTUM   DDD (degenerative disc disease)    Depression    DJD (degenerative joint disease)    ALL THE WAY DOWN SPINE   Dropfoot    LEFT , NO BRACE WORN NOW   Dysrhythmia    a fib one time after hernia surgery   Emphysema of lung (HCC)    Extrinsic asthma, unspecified    GERD (gastroesophageal reflux disease)    OCC NO MEDS FOR   Hypertension    STOPPED HTN MEDS UNTIL 2011, THEN HAD WEIGHT LOSS, NO MEDS SINCE   Neuromuscular disorder (HCC)    neuropathy  legs and feet   Neuropathy    POLYNEUOPATHOPATHY FEET AND LEGS   Peripheral vascular disease (HCC)    VARICOSE VEINS LEFT LEG   Pneumonia AS CHILD   PONV (postoperative nausea and vomiting)    Syncope    Ulnar nerve entrapment at elbow, right    Umbilical hernia    Unspecified asthma(493.90)    Past Surgical History:  Procedure Laterality Date   COLONSCOPY  06/16/2017   HERNIA REPAIR     INGUINAL HERNIA REPAIR Bilateral 07/20/2017   Procedure: LAPAROSCOPIC BILATERAL INGUINAL HERNIA REPAIR WITH MESH, UMBILICAL HERNIA REPAIR AND LYSIS OF ADHESIONS;   Surgeon: [name], De Blanch, MD;  Location: [facility];  Service: General;  Laterality: Bilateral;   LAPAROSCOPY N/A 07/20/2017   Procedure: LAPAROSCOPY DIAGNOSTIC;  Surgeon: Rodman Pickle, MD;  Location: [facility];  Service: General;  Laterality: N/A;   QUADRICEPS TENDON REPAIR Right 08/07/2020   Procedure: REPAIR QUADRICEP TENDON;  Surgeon: Sheral Apley, MD;  Location: [facility];  Service: Orthopedics;  Laterality: Right;  NEED RAPID TEST;SAME DAY WORKUP; COMING FROM [facility]   QUADRICEPS TENDON REPAIR Right 02/26/2021   Procedure: REPAIR QUADRICEP TENDON;  Surgeon: Sheral Apley, MD;  Location: [facility];  Service: Orthopedics;  Laterality: Right;   ROTATOR CUFF REPAIR Left 2005   detached; left shoulder-reattached   SHOULDER SURGERY Right    laBRAL  tendon torn   SUPERFICIAL LEFT LEG VEIN STRIPPING WITH SMALL SUPERFICIAL CLOT  2002   ULNAR NERVE TRANSPOSITION Right 05/10/2018   Procedure: RIGHT ELBOW ULNAR NERVE RELEASE;  Surgeon: Bradly Bienenstock, MD;  Location: [facility];  Service: Orthopedics;  Laterality: Right;   UMBILICAL HERNIA REPAIR  02/18/2018   UMBILICAL HERNIA REPAIR N/A 02/18/2018  Procedure: LAPAROSCOPIC UMBILICAL HERNIA REPAIR ERAS PATHWAY;  Surgeon: [name], De Blanch, MD;  Location: [facility];  Service: General;  Laterality: N/A;   Patient Active Problem List   Diagnosis Date Noted   Rupture of quadriceps tendon, right, sequela 02/26/2021   Rupture of right quadriceps tendon 08/07/2020   Ulnar neuropathy at elbow, right 05/03/2018   PAF (paroxysmal atrial fibrillation) (HCC) 02/20/2018   Chronic right shoulder pain 05/15/2016   Right knee pain 05/15/2016   Adrenal adenoma 02/20/2016   Chronic pain syndrome 10/12/2015   Dysphagia, pharyngoesophageal phase 09/14/2014   Other chronic postoperative pain 01/16/2014   Allergy, insect bite 07/02/2012   GERD (gastroesophageal reflux disease) 03/25/2012   Injury to peroneal nerve 03/12/2012   Lumbosacral  spondylosis 01/13/2012   Chronic back pain 11/17/2011   Seasonal and perennial allergic rhinitis 12/26/2010    ONSET DATE: 05/07/2023   REFERRING DIAG: M54.16 (ICD-10-CM) - Lumbar radiculopathy R20.0 (ICD-10-CM) - Bilateral leg numbness  PCP: No PCP  REFERRING PROVIDER: Glendale Chard, DO   Rationale for Evaluation and Treatment: Rehabilitation  THERAPY DIAG:  Unsteadiness on feet  Other abnormalities of gait and mobility  ONSET DATE: 05/07/2023  SUBJECTIVE:                                                                                                                                                                                           SUBJECTIVE STATEMENT:  Back is doing okay. Can we try electrical stimulation on my knee to see my nerves will wake up in my foot.  PERTINENT HISTORY:  PMH: A-fib, anxiety, chronic headaches, COPD, emphysema, HTN, Longstanding history of idiopathic neuropathy , peripheral vascular disease, hx of R quadriceps tendon repair in 2021/2022  Asymmetric right leg numbness maybe due to right lumbar radiculopathy.   PAIN:  Are you having pain? Yes: NPRS scale: "everything else feels pretty good", wound 5/10 now, back and R LE 4-5/10 at rest Pain location: R side of back, R knee, numbness down R leg  Pain description: tired feeling and difficult to move  Aggravating factors: Walking too far or getting around the house  Relieving factors: Exercises that he does at the gym that help his back   PRECAUTIONS: Fall, wound on L ankle    WEIGHT BEARING RESTRICTIONS: No  FALLS:  Has patient fallen in last 6 months? Yes. Number of falls 1 or 2 falls, can't recall how he fell, but knows it happens in the yard, can't remember the details   LIVING ENVIRONMENT:  Has following equipment at home: Single point cane, Walker - 2 wheeled, and doesn't use RW as much, but  it is there.   PLOF: Independent  PATIENT GOALS: Wants to improve the numbness in his  legs   NEXT MD VISIT: Jacalyn Lefevre, Phys Med on 08/18/23   OBJECTIVE:   DIAGNOSTIC FINDINGS:  MRI lumbar spine: 04/12/23: IMPRESSION: 1. No acute findings or clear explanation for the patient's symptoms. 2. Chronic multilevel spondylosis associated with a convex right scoliosis, only minimally progressive from previous MRI from 2008. 3. Chronic left foraminal narrowing at L3-4 with probable chronic left L3 nerve root encroachment. 4. Chronic right-greater-than-left foraminal narrowing at L4-5 and L5-S1.  PATIENT SURVEYS:  Modified Oswestry 06/26/23- Oswestry 27/50   COGNITION: Overall cognitive status: Within functional limits for tasks assessed     SENSATION: Light touch: Impaired  and due to neuropathy, more difficult to tell with RLE more distally, numbness down RLE Proprioception: WFL and able to detect at bilat ankles in PF/DF, but when tested on R foot, pt reports that it feels numb or an abnormal pain that it has to describe    POSTURE: rounded shoulders and posterior pelvic tilt Trunk flexed during gait    LOWER EXTREMITY ROM:     Limited R knee extension AROM due to hx of R quad tendon repairs   Active  Right eval Left eval Right 06/02/23 Active/passive Right 06/26/23 AROM   Hip flexion      Hip extension      Hip abduction      Hip adduction      Hip internal rotation      Hip external rotation      Knee flexion Limited, can only do about 90-95 degrees of knee flexion due to hx of quad tendon repairs  115/120   Knee extension   -25/0   Ankle dorsiflexion      Ankle plantarflexion      Ankle inversion      Ankle eversion       (Blank rows = not tested)  LOWER EXTREMITY MMT:    MMT Right eval Left eval Right 06/26/23 Left 06/26/23  Hip flexion 3+ 4- 3 knee pain  4+  Hip extension      Hip abduction      Hip adduction      Hip internal rotation      Hip external rotation      Knee flexion 4 4+    Knee extension 3- (pain in the knee) 4- 4 -(knee  pain) 4  Ankle dorsiflexion 4 (pt reports incr pain) 5    Ankle plantarflexion      Ankle inversion      Ankle eversion       (Blank rows = not tested)  GAIT: Gait pattern: decreased stance time- Right, knee flexed in stance- Right, antalgic, trunk flexed, and wide BOS Distance walked: Clinic distances  Assistive device utilized: Single point cane Level of assistance: SBA    TODAY'S TREATMENT:        07/07/23 Pt educate  L sidelying over 2 pillows: manual stretch of R quadtratus lumborum L sidelying R hip abduction: 3 x 10 R only Seated R quadratus lumborum stretch: 3 x 30" R only Standing lumbar extensions: 2 x 15  Supine lower trunk rotation: AROM: 10x R and L, manually resisted: 2 x 5 R and L- cues to engage lumbar paraspinalis Supine ab bracing with bil LE lowering: 2 x 10, partial range, not allowing feet to touch table all the way  PATIENT EDUCATION:  Education details: Additions to HEP for low back -  exercises that pt liked that he could perform in seated or standing  Person educated: Patient Education method: Explanation, Demonstration, Verbal cues, and Handouts Education comprehension: verbalized understanding and needs further education  HOME EXERCISE PROGRAM: Access Code: [email or web address] URL: [email or web address] Date: 07/07/2023 Prepared by: Lavone Nian  Exercises - Supine Single Knee to Chest Stretch  - 2 x daily - 7 x weekly - 1 sets - 3 reps - 30 hold - Supine Bridge  - 1-2 x daily - 7 x weekly - 1 sets - 5-10 reps - 1 second hold - Supine Figure 4 Piriformis Stretch  - 2 x daily - 7 x weekly - 1 sets - 3 reps - 30 hold - Supine Posterior Pelvic Tilt  - 1-2 x daily - 7 x weekly - 1 sets - 10 reps - 2-3 seconds  hold - Clamshell  - 2 x daily - 7 x weekly - 2 sets - 10 reps - Standing Lumbar Spine Flexion Stretch Counter  - 1-2 x daily - 5 x weekly - 2 sets - 10 reps - Thoracic Rotation at Counter  - 1-2 x daily - 5 x weekly - 2 sets - 10 reps -  Seated Quadratus Lumborum Stretch in Chair  - 1-2 x daily - 5 x weekly - 2 sets - 10 reps - Standing Lumbar Extension  - 1 x daily - 7 x weekly - 2 sets - 10 reps - Sidelying Hip Abduction  - 1 x daily - 7 x weekly - 2 sets - 10 reps   ASSESSMENT:  CLINICAL IMPRESSION:  Patient has been seen for total of 10 sessions in physical therapy. Patient is making gradual progress towards his long term goals. Patient will continue to benefit from skilled PT for 6 more sessions to continue to work on his remaining impairments of moderate lower back pain, gait speed, postural impairments, and functional strength.    OBJECTIVE IMPAIRMENTS: Abnormal gait, decreased activity tolerance, decreased balance, decreased endurance, decreased knowledge of use of DME, decreased mobility, difficulty walking, decreased ROM, decreased strength, hypomobility, impaired sensation, postural dysfunction, and pain.   ACTIVITY LIMITATIONS: standing, squatting, stairs, transfers, and locomotion level  PARTICIPATION LIMITATIONS: community activity  PERSONAL FACTORS: Age, Behavior pattern, Past/current experiences, Time since onset of injury/illness/exacerbation, and 3+ comorbidities:  A-fib, anxiety, chronic headaches, COPD, emphysema, HTN, Longstanding history of idiopathic neuropathy , peripheral vascular disease, hx of R quadriceps tendon repair in 2021/2022  are also affecting patient's functional outcome.   REHAB POTENTIAL: Good  CLINICAL DECISION MAKING: Evolving/moderate complexity  EVALUATION COMPLEXITY: Moderate   GOALS: Goals reviewed with patient? Yes  SHORT TERM GOALS: ALL STGS = LTGS  LONG TERM GOALS: Target date: 07/28/23  Pt will be independent with final HEP in order to build upon functional gains made in therapy. Baseline:  Goal status: ONGOING 06/26/23  2.  Pt will report 10% improvement on ODI to improve overall function Baseline:  Goal status: ONGOING 06/26/23  3.  Pt will demo >0.8 m/s gait  speed without AD to improve community ambulation Baseline: 0.76 m/s without AD (06/08/23) Goal status: ONGOING, DNT 06/26/23  4.  Pt will demo <16 sec without UE support with 5x sit to stand to improve functional strength. Baseline: 21 sec (06/08/23); 06/26/23- 40 seconds  Goal status: ONGOING 06/26/23    PLAN:  PT FREQUENCY: 2x/week  PT DURATION: 8 weeks   PLANNED INTERVENTIONS: Therapeutic exercises, Therapeutic activity, Neuromuscular re-education, Balance training, Gait training, Patient/Family education, Self Care, Joint mobilization,  Stair training, Orthotic/Fit training, DME instructions, Aquatic Therapy, Electrical stimulation, Spinal mobilization, Taping, Manual therapy, and Re-evaluation.  PLAN FOR NEXT SESSION: how is low back doing and wound?    continue with core stability, hip strengthening, knee strengthening, low back exercises   Ileana Ladd, PT 07/07/2023, 9:45 AM

## 2023-07-07 NOTE — Telephone Encounter (Signed)
PMP was Reviewed.  Tramadol e-scribed to pharmacy.  Celebrex prescription sent to pharmacy.

## 2023-07-10 ENCOUNTER — Ambulatory Visit: Payer: Medicare PPO

## 2023-07-10 DIAGNOSIS — R2681 Unsteadiness on feet: Secondary | ICD-10-CM | POA: Diagnosis not present

## 2023-07-10 DIAGNOSIS — M79604 Pain in right leg: Secondary | ICD-10-CM | POA: Diagnosis not present

## 2023-07-10 DIAGNOSIS — Z9181 History of falling: Secondary | ICD-10-CM

## 2023-07-10 DIAGNOSIS — R2689 Other abnormalities of gait and mobility: Secondary | ICD-10-CM

## 2023-07-10 NOTE — Therapy (Signed)
OUTPATIENT PHYSICAL THERAPY TREATMENT NOTE    Patient Name: Kristopher Gay MRN: [id number] DOB:[birth year], 72 y.o., male Today's Date: 07/10/2023  END OF SESSION:  PT End of Session - 07/10/23 1422     Visit Number 11    Number of Visits 16    Date for PT Re-Evaluation 07/28/23    Authorization Type [employer]    PT Start Time 1405    PT Stop Time 1445    PT Time Calculation (min) 40 min    Activity Tolerance Patient tolerated treatment well    Behavior During Therapy WFL for tasks assessed/performed                Past Medical History:  Diagnosis Date   A-fib (HCC)    Allergic rhinitis, cause unspecified    Allergy    Anxiety    Blood clot in vein 2002   left calf SUPERFICIAL CLOT REMOVED THEN PART OF VEIN REMOVED   Chronic headaches    COPD (chronic obstructive pulmonary disease) (HCC)    Cough    X 1 YEAR NO FEVER CLEAR SPUTUM   DDD (degenerative disc disease)    Depression    DJD (degenerative joint disease)    ALL THE WAY DOWN SPINE   Dropfoot    LEFT , NO BRACE WORN NOW   Dysrhythmia    a fib one time after hernia surgery   Emphysema of lung (HCC)    Extrinsic asthma, unspecified    GERD (gastroesophageal reflux disease)    OCC NO MEDS FOR   Hypertension    STOPPED HTN MEDS UNTIL 2011, THEN HAD WEIGHT LOSS, NO MEDS SINCE   Neuromuscular disorder (HCC)    neuropathy  legs and feet   Neuropathy    POLYNEUOPATHOPATHY FEET AND LEGS   Peripheral vascular disease (HCC)    VARICOSE VEINS LEFT LEG   Pneumonia AS CHILD   PONV (postoperative nausea and vomiting)    Syncope    Ulnar nerve entrapment at elbow, right    Umbilical hernia    Unspecified asthma(493.90)    Past Surgical History:  Procedure Laterality Date   COLONSCOPY  06/16/2017   HERNIA REPAIR     INGUINAL HERNIA REPAIR Bilateral 07/20/2017   Procedure: LAPAROSCOPIC BILATERAL INGUINAL HERNIA REPAIR WITH MESH, UMBILICAL HERNIA REPAIR AND LYSIS OF ADHESIONS;  Surgeon: [name], De Blanch, MD;  Location: [facility];  Service: General;  Laterality: Bilateral;   LAPAROSCOPY N/A 07/20/2017   Procedure: LAPAROSCOPY DIAGNOSTIC;  Surgeon: Rodman Pickle, MD;  Location: [facility];  Service: General;  Laterality: N/A;   QUADRICEPS TENDON REPAIR Right 08/07/2020   Procedure: REPAIR QUADRICEP TENDON;  Surgeon: Sheral Apley, MD;  Location: [facility];  Service: Orthopedics;  Laterality: Right;  NEED RAPID TEST;SAME DAY WORKUP; COMING FROM [facility]   QUADRICEPS TENDON REPAIR Right 02/26/2021   Procedure: REPAIR QUADRICEP TENDON;  Surgeon: Sheral Apley, MD;  Location: [facility];  Service: Orthopedics;  Laterality: Right;   ROTATOR CUFF REPAIR Left 2005   detached; left shoulder-reattached   SHOULDER SURGERY Right    laBRAL  tendon torn   SUPERFICIAL LEFT LEG VEIN STRIPPING WITH SMALL SUPERFICIAL CLOT  2002   ULNAR NERVE TRANSPOSITION Right 05/10/2018   Procedure: RIGHT ELBOW ULNAR NERVE RELEASE;  Surgeon: Bradly Bienenstock, MD;  Location: [facility];  Service: Orthopedics;  Laterality: Right;   UMBILICAL HERNIA REPAIR  02/18/2018   UMBILICAL HERNIA REPAIR N/A 02/18/2018   Procedure: LAPAROSCOPIC  UMBILICAL HERNIA REPAIR ERAS PATHWAY;  Surgeon: [name], De Blanch, MD;  Location: [facility];  Service: General;  Laterality: N/A;   Patient Active Problem List   Diagnosis Date Noted   Rupture of quadriceps tendon, right, sequela 02/26/2021   Rupture of right quadriceps tendon 08/07/2020   Ulnar neuropathy at elbow, right 05/03/2018   PAF (paroxysmal atrial fibrillation) (HCC) 02/20/2018   Chronic right shoulder pain 05/15/2016   Right knee pain 05/15/2016   Adrenal adenoma 02/20/2016   Chronic pain syndrome 10/12/2015   Dysphagia, pharyngoesophageal phase 09/14/2014   Other chronic postoperative pain 01/16/2014   Allergy, insect bite 07/02/2012   GERD (gastroesophageal reflux disease) 03/25/2012   Injury to peroneal nerve 03/12/2012   Lumbosacral spondylosis 01/13/2012    Chronic back pain 11/17/2011   Seasonal and perennial allergic rhinitis 12/26/2010    ONSET DATE: 05/07/2023   REFERRING DIAG: M54.16 (ICD-10-CM) - Lumbar radiculopathy R20.0 (ICD-10-CM) - Bilateral leg numbness  PCP: No PCP  REFERRING PROVIDER: Glendale Chard, DO   Rationale for Evaluation and Treatment: Rehabilitation  THERAPY DIAG:  Unsteadiness on feet  History of falling  Other abnormalities of gait and mobility  ONSET DATE: 05/07/2023  SUBJECTIVE:                                                                                                                                                                                           SUBJECTIVE STATEMENT:  I feel when I got home after last session. I don't know exactly what happen but I feel like I tripped over my feet. Pt reports of falling on L side. Has mild bruising in L upper arm. Pt denies any pain or worsening of wound on left ankle.  PERTINENT HISTORY:  PMH: A-fib, anxiety, chronic headaches, COPD, emphysema, HTN, Longstanding history of idiopathic neuropathy , peripheral vascular disease, hx of R quadriceps tendon repair in 2021/2022  Asymmetric right leg numbness maybe due to right lumbar radiculopathy.   PAIN:  Are you having pain? Yes: NPRS scale: "everything else feels pretty good", wound 5/10 now, back and R LE 4-5/10 at rest Pain location: R side of back, R knee, numbness down R leg  Pain description: tired feeling and difficult to move  Aggravating factors: Walking too far or getting around the house  Relieving factors: Exercises that he does at the gym that help his back   PRECAUTIONS: Fall, wound on L ankle    WEIGHT BEARING RESTRICTIONS: No  FALLS:  Has patient fallen in last 6 months? Yes. Number of falls 1 or 2 falls, can't recall how he fell, but knows it happens in the  yard, can't remember the details   LIVING ENVIRONMENT:  Has following equipment at home: Single point cane, Walker - 2  wheeled, and doesn't use RW as much, but it is there.   PLOF: Independent  PATIENT GOALS: Wants to improve the numbness in his legs   NEXT MD VISIT: Jacalyn Lefevre, Phys Med on 08/18/23   OBJECTIVE:   DIAGNOSTIC FINDINGS:  MRI lumbar spine: 04/12/23: IMPRESSION: 1. No acute findings or clear explanation for the patient's symptoms. 2. Chronic multilevel spondylosis associated with a convex right scoliosis, only minimally progressive from previous MRI from 2008. 3. Chronic left foraminal narrowing at L3-4 with probable chronic left L3 nerve root encroachment. 4. Chronic right-greater-than-left foraminal narrowing at L4-5 and L5-S1.  PATIENT SURVEYS:  Modified Oswestry 06/26/23- Oswestry 27/50   COGNITION: Overall cognitive status: Within functional limits for tasks assessed     SENSATION: Light touch: Impaired  and due to neuropathy, more difficult to tell with RLE more distally, numbness down RLE Proprioception: WFL and able to detect at bilat ankles in PF/DF, but when tested on R foot, pt reports that it feels numb or an abnormal pain that it has to describe    POSTURE: rounded shoulders and posterior pelvic tilt Trunk flexed during gait    LOWER EXTREMITY ROM:     Limited R knee extension AROM due to hx of R quad tendon repairs   Active  Right eval Left eval Right 06/02/23 Active/passive Right 06/26/23 AROM   Hip flexion      Hip extension      Hip abduction      Hip adduction      Hip internal rotation      Hip external rotation      Knee flexion Limited, can only do about 90-95 degrees of knee flexion due to hx of quad tendon repairs  115/120   Knee extension   -25/0   Ankle dorsiflexion      Ankle plantarflexion      Ankle inversion      Ankle eversion       (Blank rows = not tested)  LOWER EXTREMITY MMT:    MMT Right eval Left eval Right 06/26/23 Left 06/26/23  Hip flexion 3+ 4- 3 knee pain  4+  Hip extension      Hip abduction      Hip adduction       Hip internal rotation      Hip external rotation      Knee flexion 4 4+    Knee extension 3- (pain in the knee) 4- 4 -(knee pain) 4  Ankle dorsiflexion 4 (pt reports incr pain) 5    Ankle plantarflexion      Ankle inversion      Ankle eversion       (Blank rows = not tested)  GAIT: Gait pattern: decreased stance time- Right, knee flexed in stance- Right, antalgic, trunk flexed, and wide BOS Distance walked: Clinic distances  Assistive device utilized: Single point cane Level of assistance: SBA    TODAY'S TREATMENT:        Soft tissue and myofascial release to R thoracolumbar praspinalis with patient in L side lying R hip abduction: 3 x 10 Manually stretched R hip flexors: 3 x 1' Supine hip flexor stretch off EOB: 1' Supine bridge: 10x Supine lower trunk rotation: resisted with blue sport cord: 2 x 10 R and L Supine ab bracing with bil LE lowering: 2 x 10 PATIENT EDUCATION:  Education  details: Additions to HEP for low back - exercises that pt liked that he could perform in seated or standing  Person educated: Patient Education method: Explanation, Demonstration, Verbal cues, and Handouts Education comprehension: verbalized understanding and needs further education  HOME EXERCISE PROGRAM: Access Code: [email or web address] URL: [email or web address] Date: 07/07/2023 Prepared by: Lavone Nian  Exercises - Supine Single Knee to Chest Stretch  - 2 x daily - 7 x weekly - 1 sets - 3 reps - 30 hold - Supine Bridge  - 1-2 x daily - 7 x weekly - 1 sets - 5-10 reps - 1 second hold - Supine Figure 4 Piriformis Stretch  - 2 x daily - 7 x weekly - 1 sets - 3 reps - 30 hold - Supine Posterior Pelvic Tilt  - 1-2 x daily - 7 x weekly - 1 sets - 10 reps - 2-3 seconds  hold - Clamshell  - 2 x daily - 7 x weekly - 2 sets - 10 reps - Standing Lumbar Spine Flexion Stretch Counter  - 1-2 x daily - 5 x weekly - 2 sets - 10 reps - Thoracic Rotation at Counter  - 1-2 x daily - 5 x weekly - 2  sets - 10 reps - Seated Quadratus Lumborum Stretch in Chair  - 1-2 x daily - 5 x weekly - 2 sets - 10 reps - Standing Lumbar Extension  - 1 x daily - 7 x weekly - 2 sets - 10 reps - Sidelying Hip Abduction  - 1 x daily - 7 x weekly - 2 sets - 10 reps   ASSESSMENT:  CLINICAL IMPRESSION: Tolerated session well. Rported mild reduction in lumbar pain at end of the session.   OBJECTIVE IMPAIRMENTS: Abnormal gait, decreased activity tolerance, decreased balance, decreased endurance, decreased knowledge of use of DME, decreased mobility, difficulty walking, decreased ROM, decreased strength, hypomobility, impaired sensation, postural dysfunction, and pain.   ACTIVITY LIMITATIONS: standing, squatting, stairs, transfers, and locomotion level  PARTICIPATION LIMITATIONS: community activity  PERSONAL FACTORS: Age, Behavior pattern, Past/current experiences, Time since onset of injury/illness/exacerbation, and 3+ comorbidities:  A-fib, anxiety, chronic headaches, COPD, emphysema, HTN, Longstanding history of idiopathic neuropathy , peripheral vascular disease, hx of R quadriceps tendon repair in 2021/2022  are also affecting patient's functional outcome.   REHAB POTENTIAL: Good  CLINICAL DECISION MAKING: Evolving/moderate complexity  EVALUATION COMPLEXITY: Moderate   GOALS: Goals reviewed with patient? Yes  SHORT TERM GOALS: ALL STGS = LTGS  LONG TERM GOALS: Target date: 07/28/23  Pt will be independent with final HEP in order to build upon functional gains made in therapy. Baseline:  Goal status: ONGOING 06/26/23  2.  Pt will report 10% improvement on ODI to improve overall function Baseline:  Goal status: ONGOING 06/26/23  3.  Pt will demo >0.8 m/s gait speed without AD to improve community ambulation Baseline: 0.76 m/s without AD (06/08/23) Goal status: ONGOING, DNT 06/26/23  4.  Pt will demo <16 sec without UE support with 5x sit to stand to improve functional strength. Baseline: 21  sec (06/08/23); 06/26/23- 40 seconds  Goal status: ONGOING 06/26/23    PLAN:  PT FREQUENCY: 2x/week  PT DURATION: 8 weeks   PLANNED INTERVENTIONS: Therapeutic exercises, Therapeutic activity, Neuromuscular re-education, Balance training, Gait training, Patient/Family education, Self Care, Joint mobilization, Stair training, Orthotic/Fit training, DME instructions, Aquatic Therapy, Electrical stimulation, Spinal mobilization, Taping, Manual therapy, and Re-evaluation.  PLAN FOR NEXT SESSION: how is low back doing and wound?    continue with  core stability, hip strengthening, knee strengthening, low back exercises   Ileana Ladd, PT 07/10/2023, 2:23 PM

## 2023-07-13 ENCOUNTER — Ambulatory Visit: Payer: Medicare PPO

## 2023-07-13 DIAGNOSIS — Z9181 History of falling: Secondary | ICD-10-CM | POA: Diagnosis not present

## 2023-07-13 DIAGNOSIS — M79604 Pain in right leg: Secondary | ICD-10-CM | POA: Diagnosis not present

## 2023-07-13 DIAGNOSIS — R2681 Unsteadiness on feet: Secondary | ICD-10-CM | POA: Diagnosis not present

## 2023-07-13 DIAGNOSIS — R2689 Other abnormalities of gait and mobility: Secondary | ICD-10-CM | POA: Diagnosis not present

## 2023-07-13 NOTE — Therapy (Signed)
OUTPATIENT PHYSICAL THERAPY TREATMENT NOTE    Patient Name: Kristopher Gay MRN: [id number] DOB:[birth year], 72 y.o., male Today's Date: 07/13/2023  END OF SESSION:  PT End of Session - 07/13/23 1452     Visit Number 12    Number of Visits 16    Date for PT Re-Evaluation 07/28/23    Authorization Type [employer]    PT Start Time 1400    PT Stop Time 1445    PT Time Calculation (min) 45 min    Activity Tolerance Patient tolerated treatment well    Behavior During Therapy WFL for tasks assessed/performed                 Past Medical History:  Diagnosis Date   A-fib (HCC)    Allergic rhinitis, cause unspecified    Allergy    Anxiety    Blood clot in vein 2002   left calf SUPERFICIAL CLOT REMOVED THEN PART OF VEIN REMOVED   Chronic headaches    COPD (chronic obstructive pulmonary disease) (HCC)    Cough    X 1 YEAR NO FEVER CLEAR SPUTUM   DDD (degenerative disc disease)    Depression    DJD (degenerative joint disease)    ALL THE WAY DOWN SPINE   Dropfoot    LEFT , NO BRACE WORN NOW   Dysrhythmia    a fib one time after hernia surgery   Emphysema of lung (HCC)    Extrinsic asthma, unspecified    GERD (gastroesophageal reflux disease)    OCC NO MEDS FOR   Hypertension    STOPPED HTN MEDS UNTIL 2011, THEN HAD WEIGHT LOSS, NO MEDS SINCE   Neuromuscular disorder (HCC)    neuropathy  legs and feet   Neuropathy    POLYNEUOPATHOPATHY FEET AND LEGS   Peripheral vascular disease (HCC)    VARICOSE VEINS LEFT LEG   Pneumonia AS CHILD   PONV (postoperative nausea and vomiting)    Syncope    Ulnar nerve entrapment at elbow, right    Umbilical hernia    Unspecified asthma(493.90)    Past Surgical History:  Procedure Laterality Date   COLONSCOPY  06/16/2017   HERNIA REPAIR     INGUINAL HERNIA REPAIR Bilateral 07/20/2017   Procedure: LAPAROSCOPIC BILATERAL INGUINAL HERNIA REPAIR WITH MESH, UMBILICAL HERNIA REPAIR AND LYSIS OF ADHESIONS;  Surgeon: [name], De Blanch, MD;  Location: [facility];  Service: General;  Laterality: Bilateral;   LAPAROSCOPY N/A 07/20/2017   Procedure: LAPAROSCOPY DIAGNOSTIC;  Surgeon: Rodman Pickle, MD;  Location: [facility];  Service: General;  Laterality: N/A;   QUADRICEPS TENDON REPAIR Right 08/07/2020   Procedure: REPAIR QUADRICEP TENDON;  Surgeon: Sheral Apley, MD;  Location: [facility];  Service: Orthopedics;  Laterality: Right;  NEED RAPID TEST;SAME DAY WORKUP; COMING FROM [facility]   QUADRICEPS TENDON REPAIR Right 02/26/2021   Procedure: REPAIR QUADRICEP TENDON;  Surgeon: Sheral Apley, MD;  Location: [facility];  Service: Orthopedics;  Laterality: Right;   ROTATOR CUFF REPAIR Left 2005   detached; left shoulder-reattached   SHOULDER SURGERY Right    laBRAL  tendon torn   SUPERFICIAL LEFT LEG VEIN STRIPPING WITH SMALL SUPERFICIAL CLOT  2002   ULNAR NERVE TRANSPOSITION Right 05/10/2018   Procedure: RIGHT ELBOW ULNAR NERVE RELEASE;  Surgeon: Bradly Bienenstock, MD;  Location: [facility];  Service: Orthopedics;  Laterality: Right;   UMBILICAL HERNIA REPAIR  02/18/2018   UMBILICAL HERNIA REPAIR N/A 02/18/2018   Procedure:  LAPAROSCOPIC UMBILICAL HERNIA REPAIR ERAS PATHWAY;  Surgeon: [name], De Blanch, MD;  Location: [facility];  Service: General;  Laterality: N/A;   Patient Active Problem List   Diagnosis Date Noted   Rupture of quadriceps tendon, right, sequela 02/26/2021   Rupture of right quadriceps tendon 08/07/2020   Ulnar neuropathy at elbow, right 05/03/2018   PAF (paroxysmal atrial fibrillation) (HCC) 02/20/2018   Chronic right shoulder pain 05/15/2016   Right knee pain 05/15/2016   Adrenal adenoma 02/20/2016   Chronic pain syndrome 10/12/2015   Dysphagia, pharyngoesophageal phase 09/14/2014   Other chronic postoperative pain 01/16/2014   Allergy, insect bite 07/02/2012   GERD (gastroesophageal reflux disease) 03/25/2012   Injury to peroneal nerve 03/12/2012   Lumbosacral spondylosis 01/13/2012    Chronic back pain 11/17/2011   Seasonal and perennial allergic rhinitis 12/26/2010    ONSET DATE: 05/07/2023   REFERRING DIAG: M54.16 (ICD-10-CM) - Lumbar radiculopathy R20.0 (ICD-10-CM) - Bilateral leg numbness  PCP: No PCP  REFERRING PROVIDER: Glendale Chard, DO   Rationale for Evaluation and Treatment: Rehabilitation  THERAPY DIAG:  Unsteadiness on feet  Other abnormalities of gait and mobility  Pain of right lower extremity  History of falling  ONSET DATE: 05/07/2023  SUBJECTIVE:                                                                                                                                                                                           SUBJECTIVE STATEMENT:  I feel when I got home after last session. I don't know exactly what happen but I feel like I tripped over my feet. Pt reports of falling on L side. Has mild bruising in L upper arm. Pt denies any pain or worsening of wound on left ankle.  PERTINENT HISTORY:  PMH: A-fib, anxiety, chronic headaches, COPD, emphysema, HTN, Longstanding history of idiopathic neuropathy , peripheral vascular disease, hx of R quadriceps tendon repair in 2021/2022  Asymmetric right leg numbness maybe due to right lumbar radiculopathy.   PAIN:  Are you having pain? Yes: NPRS scale: "everything else feels pretty good", wound 5/10 now, back and R LE 4-5/10 at rest Pain location: R side of back, R knee, numbness down R leg  Pain description: tired feeling and difficult to move  Aggravating factors: Walking too far or getting around the house  Relieving factors: Exercises that he does at the gym that help his back   PRECAUTIONS: Fall, wound on L ankle    WEIGHT BEARING RESTRICTIONS: No  FALLS:  Has patient fallen in last 6 months? Yes. Number of falls 1 or 2 falls, can't recall how he  fell, but knows it happens in the yard, can't remember the details   LIVING ENVIRONMENT:  Has following equipment at home:  Single point cane, Walker - 2 wheeled, and doesn't use RW as much, but it is there.   PLOF: Independent  PATIENT GOALS: Wants to improve the numbness in his legs   NEXT MD VISIT: Jacalyn Lefevre, Phys Med on 08/18/23   OBJECTIVE:   DIAGNOSTIC FINDINGS:  MRI lumbar spine: 04/12/23: IMPRESSION: 1. No acute findings or clear explanation for the patient's symptoms. 2. Chronic multilevel spondylosis associated with a convex right scoliosis, only minimally progressive from previous MRI from 2008. 3. Chronic left foraminal narrowing at L3-4 with probable chronic left L3 nerve root encroachment. 4. Chronic right-greater-than-left foraminal narrowing at L4-5 and L5-S1.  PATIENT SURVEYS:  Modified Oswestry 06/26/23- Oswestry 27/50   COGNITION: Overall cognitive status: Within functional limits for tasks assessed     SENSATION: Light touch: Impaired  and due to neuropathy, more difficult to tell with RLE more distally, numbness down RLE Proprioception: WFL and able to detect at bilat ankles in PF/DF, but when tested on R foot, pt reports that it feels numb or an abnormal pain that it has to describe    POSTURE: rounded shoulders and posterior pelvic tilt Trunk flexed during gait    LOWER EXTREMITY ROM:     Limited R knee extension AROM due to hx of R quad tendon repairs   Active  Right eval Left eval Right 06/02/23 Active/passive Right 06/26/23 AROM   Hip flexion      Hip extension      Hip abduction      Hip adduction      Hip internal rotation      Hip external rotation      Knee flexion Limited, can only do about 90-95 degrees of knee flexion due to hx of quad tendon repairs  115/120   Knee extension   -25/0   Ankle dorsiflexion      Ankle plantarflexion      Ankle inversion      Ankle eversion       (Blank rows = not tested)  LOWER EXTREMITY MMT:    MMT Right eval Left eval Right 06/26/23 Left 06/26/23  Hip flexion 3+ 4- 3 knee pain  4+  Hip extension      Hip  abduction      Hip adduction      Hip internal rotation      Hip external rotation      Knee flexion 4 4+    Knee extension 3- (pain in the knee) 4- 4 -(knee pain) 4  Ankle dorsiflexion 4 (pt reports incr pain) 5    Ankle plantarflexion      Ankle inversion      Ankle eversion       (Blank rows = not tested)  GAIT: Gait pattern: decreased stance time- Right, knee flexed in stance- Right, antalgic, trunk flexed, and wide BOS Distance walked: Clinic distances  Assistive device utilized: Single point cane Level of assistance: SBA    TODAY'S TREATMENT:        Soft tissue and myofascial release to R thoracolumbar praspinalis with patient in L side lying Grade IV ribs 8-10 PA mobilization, unilateral on R with trunk anterior and posterior rotation with incorporation of breathing in and out with patient in L sidelying R hip abduction: 3 x 15 Manually stretched R hip flexors: 3 x 1'- added knee flexion to tolerance  today  Supine bridge: 10x Supine lower trunk rotation: resisted with blue sport cord: 2 x 10 R and L Supine Kristopher bracing with bil LE lowering: 2 x 10. 2kg ball. Gait training: 1 x 115' with walking sticks, pt given education to continue to perform standing back extensions to stretch out lumbar spine and then being mindful of maintaining erect spine posture when standing and walking. Pt educated that we will trial walking sticks couple of times in therapy to allow him to see whether or not they will be beneficial for him.   PATIENT EDUCATION:  Education details: Additions to HEP for low back - exercises that pt liked that he could perform in seated or standing  Person educated: Patient Education method: Explanation, Demonstration, Verbal cues, and Handouts Education comprehension: verbalized understanding and needs further education  HOME EXERCISE PROGRAM: Access Code: [email or web address] URL: [email or web address] Date: 07/07/2023 Prepared by: Lavone Nian  Exercises - Supine Single Knee to Chest Stretch  - 2 x daily - 7 x weekly - 1 sets - 3 reps - 30 hold - Supine Bridge  - 1-2 x daily - 7 x weekly - 1 sets - 5-10 reps - 1 second hold - Supine Figure 4 Piriformis Stretch  - 2 x daily - 7 x weekly - 1 sets - 3 reps - 30 hold - Supine Posterior Pelvic Tilt  - 1-2 x daily - 7 x weekly - 1 sets - 10 reps - 2-3 seconds  hold - Clamshell  - 2 x daily - 7 x weekly - 2 sets - 10 reps - Standing Lumbar Spine Flexion Stretch Counter  - 1-2 x daily - 5 x weekly - 2 sets - 10 reps - Thoracic Rotation at Counter  - 1-2 x daily - 5 x weekly - 2 sets - 10 reps - Seated Quadratus Lumborum Stretch in Chair  - 1-2 x daily - 5 x weekly - 2 sets - 10 reps - Standing Lumbar Extension  - 1 x daily - 7 x weekly - 2 sets - 10 reps - Sidelying Hip Abduction  - 1 x daily - 7 x weekly - 2 sets - 10 reps   ASSESSMENT:  CLINICAL IMPRESSION: Tolerated session well. Reporting gradual improvement in pain levels. Pt repoted improved sense of balance when using walking sticks and posture.    OBJECTIVE IMPAIRMENTS: Abnormal gait, decreased activity tolerance, decreased balance, decreased endurance, decreased knowledge of use of DME, decreased mobility, difficulty walking, decreased ROM, decreased strength, hypomobility, impaired sensation, postural dysfunction, and pain.   ACTIVITY LIMITATIONS: standing, squatting, stairs, transfers, and locomotion level  PARTICIPATION LIMITATIONS: community activity  PERSONAL FACTORS: Age, Behavior pattern, Past/current experiences, Time since onset of injury/illness/exacerbation, and 3+ comorbidities:  A-fib, anxiety, chronic headaches, COPD, emphysema, HTN, Longstanding history of idiopathic neuropathy , peripheral vascular disease, hx of R quadriceps tendon repair in 2021/2022  are also affecting patient's functional outcome.   REHAB POTENTIAL: Good  CLINICAL DECISION MAKING: Evolving/moderate complexity  EVALUATION  COMPLEXITY: Moderate   GOALS: Goals reviewed with patient? Yes  SHORT TERM GOALS: ALL STGS = LTGS  LONG TERM GOALS: Target date: 07/28/23  Pt will be independent with final HEP in order to build upon functional gains made in therapy. Baseline:  Goal status: ONGOING 06/26/23  2.  Pt will report 10% improvement on ODI to improve overall function Baseline:  Goal status: ONGOING 06/26/23  3.  Pt will demo >0.8 m/s gait speed without AD to improve community ambulation Baseline:  0.76 m/s without AD (06/08/23) Goal status: ONGOING, DNT 06/26/23  4.  Pt will demo <16 sec without UE support with 5x sit to stand to improve functional strength. Baseline: 21 sec (06/08/23); 06/26/23- 40 seconds  Goal status: ONGOING 06/26/23    PLAN:  PT FREQUENCY: 2x/week  PT DURATION: 8 weeks   PLANNED INTERVENTIONS: Therapeutic exercises, Therapeutic activity, Neuromuscular re-education, Balance training, Gait training, Patient/Family education, Self Care, Joint mobilization, Stair training, Orthotic/Fit training, DME instructions, Aquatic Therapy, Electrical stimulation, Spinal mobilization, Taping, Manual therapy, and Re-evaluation.  PLAN FOR NEXT SESSION: how is low back doing and wound?    continue with core stability, hip strengthening, knee strengthening, low back exercises   Ileana Ladd, PT 07/13/2023, 2:52 PM

## 2023-07-16 ENCOUNTER — Ambulatory Visit: Payer: Medicare PPO

## 2023-07-16 DIAGNOSIS — R2689 Other abnormalities of gait and mobility: Secondary | ICD-10-CM

## 2023-07-16 DIAGNOSIS — M79604 Pain in right leg: Secondary | ICD-10-CM

## 2023-07-16 DIAGNOSIS — Z9181 History of falling: Secondary | ICD-10-CM | POA: Diagnosis not present

## 2023-07-16 DIAGNOSIS — R2681 Unsteadiness on feet: Secondary | ICD-10-CM

## 2023-07-16 NOTE — Therapy (Signed)
OUTPATIENT PHYSICAL THERAPY TREATMENT NOTE    Patient Name: Kristopher Gay MRN: [id number] DOB:1951-03-18, 72 y.o., male Today's Date: 07/16/2023  END OF SESSION:  PT End of Session - 07/16/23 1357     Visit Number 13    Number of Visits 16    Date for PT Re-Evaluation 07/28/23    Authorization Type [employer]    PT Start Time 1315    PT Stop Time 1355    PT Time Calculation (min) 40 min    Activity Tolerance Patient tolerated treatment well    Behavior During Therapy WFL for tasks assessed/performed                 Past Medical History:  Diagnosis Date   A-fib (HCC)    Allergic rhinitis, cause unspecified    Allergy    Anxiety    Blood clot in vein 2002   left calf SUPERFICIAL CLOT REMOVED THEN PART OF VEIN REMOVED   Chronic headaches    COPD (chronic obstructive pulmonary disease) (HCC)    Cough    X 1 YEAR NO FEVER CLEAR SPUTUM   DDD (degenerative disc disease)    Depression    DJD (degenerative joint disease)    ALL THE WAY DOWN SPINE   Dropfoot    LEFT , NO BRACE WORN NOW   Dysrhythmia    a fib one time after hernia surgery   Emphysema of lung (HCC)    Extrinsic asthma, unspecified    GERD (gastroesophageal reflux disease)    OCC NO MEDS FOR   Hypertension    STOPPED HTN MEDS UNTIL 2011, THEN HAD WEIGHT LOSS, NO MEDS SINCE   Neuromuscular disorder (HCC)    neuropathy  legs and feet   Neuropathy    POLYNEUOPATHOPATHY FEET AND LEGS   Peripheral vascular disease (HCC)    VARICOSE VEINS LEFT LEG   Pneumonia AS CHILD   PONV (postoperative nausea and vomiting)    Syncope    Ulnar nerve entrapment at elbow, right    Umbilical hernia    Unspecified asthma(493.90)    Past Surgical History:  Procedure Laterality Date   COLONSCOPY  06/16/2017   HERNIA REPAIR     INGUINAL HERNIA REPAIR Bilateral 07/20/2017   Procedure: LAPAROSCOPIC BILATERAL INGUINAL HERNIA REPAIR WITH MESH, UMBILICAL HERNIA REPAIR AND LYSIS OF ADHESIONS;  Surgeon: [name], De Blanch, MD;  Location: [facility];  Service: General;  Laterality: Bilateral;   LAPAROSCOPY N/A 07/20/2017   Procedure: LAPAROSCOPY DIAGNOSTIC;  Surgeon: Rodman Pickle, MD;  Location: [facility];  Service: General;  Laterality: N/A;   QUADRICEPS TENDON REPAIR Right 08/07/2020   Procedure: REPAIR QUADRICEP TENDON;  Surgeon: Sheral Apley, MD;  Location: [facility];  Service: Orthopedics;  Laterality: Right;  NEED RAPID TEST;SAME DAY WORKUP; COMING FROM [facility]   QUADRICEPS TENDON REPAIR Right 02/26/2021   Procedure: REPAIR QUADRICEP TENDON;  Surgeon: Sheral Apley, MD;  Location: [facility];  Service: Orthopedics;  Laterality: Right;   ROTATOR CUFF REPAIR Left 2005   detached; left shoulder-reattached   SHOULDER SURGERY Right    laBRAL  tendon torn   SUPERFICIAL LEFT LEG VEIN STRIPPING WITH SMALL SUPERFICIAL CLOT  2002   ULNAR NERVE TRANSPOSITION Right 05/10/2018   Procedure: RIGHT ELBOW ULNAR NERVE RELEASE;  Surgeon: Bradly Bienenstock, MD;  Location: [facility];  Service: Orthopedics;  Laterality: Right;   UMBILICAL HERNIA REPAIR  02/18/2018   UMBILICAL HERNIA REPAIR N/A 02/18/2018   Procedure:  LAPAROSCOPIC UMBILICAL HERNIA REPAIR ERAS PATHWAY;  Surgeon: [name], De Blanch, MD;  Location: [facility] OR;  Service: General;  Laterality: N/A;   Patient Active Problem List   Diagnosis Date Noted   Rupture of quadriceps tendon, right, sequela 02/26/2021   Rupture of right quadriceps tendon 08/07/2020   Ulnar neuropathy at elbow, right 05/03/2018   PAF (paroxysmal atrial fibrillation) (HCC) 02/20/2018   Chronic right shoulder pain 05/15/2016   Right knee pain 05/15/2016   Adrenal adenoma 02/20/2016   Chronic pain syndrome 10/12/2015   Dysphagia, pharyngoesophageal phase 09/14/2014   Other chronic postoperative pain 01/16/2014   Allergy, insect bite 07/02/2012   GERD (gastroesophageal reflux disease) 03/25/2012   Injury to peroneal nerve 03/12/2012   Lumbosacral spondylosis 01/13/2012    Chronic back pain 11/17/2011   Seasonal and perennial allergic rhinitis 12/26/2010    ONSET DATE: 05/07/2023   REFERRING DIAG: M54.16 (ICD-10-CM) - Lumbar radiculopathy R20.0 (ICD-10-CM) - Bilateral leg numbness  PCP: No PCP  REFERRING PROVIDER: Glendale Chard, DO   Rationale for Evaluation and Treatment: Rehabilitation  THERAPY DIAG:  Unsteadiness on feet  Other abnormalities of gait and mobility  Pain of right lower extremity  ONSET DATE: 05/07/2023  SUBJECTIVE:                                                                                                                                                                                           SUBJECTIVE STATEMENT:  Pt reports of no significant back pain today. Pt reports he forgot his cane at home so he is using yard stick to walk with.   PERTINENT HISTORY:  PMH: A-fib, anxiety, chronic headaches, COPD, emphysema, HTN, Longstanding history of idiopathic neuropathy , peripheral vascular disease, hx of R quadriceps tendon repair in 2021/2022  Asymmetric right leg numbness maybe due to right lumbar radiculopathy.   PAIN:  PAIN:  Are you having pain? No   PRECAUTIONS: Fall, wound on L ankle    WEIGHT BEARING RESTRICTIONS: No  FALLS:  Has patient fallen in last 6 months? Yes. Number of falls 1 or 2 falls, can't recall how he fell, but knows it happens in the yard, can't remember the details   LIVING ENVIRONMENT:  Has following equipment at home: Single point cane, Walker - 2 wheeled, and doesn't use RW as much, but it is there.   PLOF: Independent  PATIENT GOALS: Wants to improve the numbness in his legs   NEXT MD VISIT: Jacalyn Lefevre, Phys Med on 08/18/23   OBJECTIVE:   DIAGNOSTIC FINDINGS:  MRI lumbar spine: 04/12/23: IMPRESSION: 1. No acute findings or clear explanation for the patient's  symptoms. 2. Chronic multilevel spondylosis associated with a convex right scoliosis, only minimally progressive  from previous MRI from 2008. 3. Chronic left foraminal narrowing at L3-4 with probable chronic left L3 nerve root encroachment. 4. Chronic right-greater-than-left foraminal narrowing at L4-5 and L5-S1.  PATIENT SURVEYS:  Modified Oswestry 06/26/23- Oswestry 27/50   COGNITION: Overall cognitive status: Within functional limits for tasks assessed     SENSATION: Light touch: Impaired  and due to neuropathy, more difficult to tell with RLE more distally, numbness down RLE Proprioception: WFL and able to detect at bilat ankles in PF/DF, but when tested on R foot, pt reports that it feels numb or an abnormal pain that it has to describe    POSTURE: rounded shoulders and posterior pelvic tilt Trunk flexed during gait    LOWER EXTREMITY ROM:     Limited R knee extension AROM due to hx of R quad tendon repairs   Active  Right eval Left eval Right 06/02/23 Active/passive Right 06/26/23 AROM   Hip flexion      Hip extension      Hip abduction      Hip adduction      Hip internal rotation      Hip external rotation      Knee flexion Limited, can only do about 90-95 degrees of knee flexion due to hx of quad tendon repairs  115/120   Knee extension   -25/0   Ankle dorsiflexion      Ankle plantarflexion      Ankle inversion      Ankle eversion       (Blank rows = not tested)  LOWER EXTREMITY MMT:    MMT Right eval Left eval Right 06/26/23 Left 06/26/23  Hip flexion 3+ 4- 3 knee pain  4+  Hip extension      Hip abduction      Hip adduction      Hip internal rotation      Hip external rotation      Knee flexion 4 4+    Knee extension 3- (pain in the knee) 4- 4 -(knee pain) 4  Ankle dorsiflexion 4 (pt reports incr pain) 5    Ankle plantarflexion      Ankle inversion      Ankle eversion       (Blank rows = not tested)  GAIT: Gait pattern: decreased stance time- Right, knee flexed in stance- Right, antalgic, trunk flexed, and wide BOS Distance walked: Clinic distances   Assistive device utilized: Single point cane Level of assistance: SBA    TODAY'S TREATMENT:        Soft tissue and myofascial release to R thoracolumbar praspinalis with patient in L side lying; also worked on upper thoracic paraspinalis on R side Grade IV unilateral PA mobs at R T11-12 with forward trunk rotation with patient in L side lying  Grade IV ribs 8-10 PA mobilization, unilateral on R with trunk anterior and posterior rotation with incorporation of breathing in and out with patient in L sidelying R hip abduction: 3 x 10, 2lbs Manually stretched R hip flexors: 3 x 1'- added knee flexion to tolerance today Foam roll massage to R quads with gentle pressure: 5' Gait training: 230' with walking sticks bil with cues for erect posture.  PATIENT EDUCATION:  Education details: Additions to HEP for low back - exercises that pt liked that he could perform in seated or standing  Person educated: Patient Education method: Explanation, Demonstration, Verbal cues, and  Handouts Education comprehension: verbalized understanding and needs further education  HOME EXERCISE PROGRAM: Access Code: [email or web address] URL: [email or web address] Date: 07/07/2023 Prepared by: Lavone Nian  Exercises - Supine Single Knee to Chest Stretch  - 2 x daily - 7 x weekly - 1 sets - 3 reps - 30 hold - Supine Bridge  - 1-2 x daily - 7 x weekly - 1 sets - 5-10 reps - 1 second hold - Supine Figure 4 Piriformis Stretch  - 2 x daily - 7 x weekly - 1 sets - 3 reps - 30 hold - Supine Posterior Pelvic Tilt  - 1-2 x daily - 7 x weekly - 1 sets - 10 reps - 2-3 seconds  hold - Clamshell  - 2 x daily - 7 x weekly - 2 sets - 10 reps - Standing Lumbar Spine Flexion Stretch Counter  - 1-2 x daily - 5 x weekly - 2 sets - 10 reps - Thoracic Rotation at Counter  - 1-2 x daily - 5 x weekly - 2 sets - 10 reps - Seated Quadratus Lumborum Stretch in Chair  - 1-2 x daily - 5 x weekly - 2 sets - 10 reps - Standing Lumbar  Extension  - 1 x daily - 7 x weekly - 2 sets - 10 reps - Sidelying Hip Abduction  - 1 x daily - 7 x weekly - 2 sets - 10 reps   ASSESSMENT:  CLINICAL IMPRESSION: Tolerated session well. Reporting gradual improvement in pain levels. Pt repoted improved sense of balance when using walking sticks and posture.    OBJECTIVE IMPAIRMENTS: Abnormal gait, decreased activity tolerance, decreased balance, decreased endurance, decreased knowledge of use of DME, decreased mobility, difficulty walking, decreased ROM, decreased strength, hypomobility, impaired sensation, postural dysfunction, and pain.   ACTIVITY LIMITATIONS: standing, squatting, stairs, transfers, and locomotion level  PARTICIPATION LIMITATIONS: community activity  PERSONAL FACTORS: Age, Behavior pattern, Past/current experiences, Time since onset of injury/illness/exacerbation, and 3+ comorbidities:  A-fib, anxiety, chronic headaches, COPD, emphysema, HTN, Longstanding history of idiopathic neuropathy , peripheral vascular disease, hx of R quadriceps tendon repair in 2021/2022  are also affecting patient's functional outcome.   REHAB POTENTIAL: Good  CLINICAL DECISION MAKING: Evolving/moderate complexity  EVALUATION COMPLEXITY: Moderate   GOALS: Goals reviewed with patient? Yes  SHORT TERM GOALS: ALL STGS = LTGS  LONG TERM GOALS: Target date: 07/28/23  Pt will be independent with final HEP in order to build upon functional gains made in therapy. Baseline:  Goal status: ONGOING 06/26/23  2.  Pt will report 10% improvement on ODI to improve overall function Baseline:  Goal status: ONGOING 06/26/23  3.  Pt will demo >0.8 m/s gait speed without AD to improve community ambulation Baseline: 0.76 m/s without AD (06/08/23) Goal status: ONGOING, DNT 06/26/23  4.  Pt will demo <16 sec without UE support with 5x sit to stand to improve functional strength. Baseline: 21 sec (06/08/23); 06/26/23- 40 seconds  Goal status: ONGOING  06/26/23    PLAN:  PT FREQUENCY: 2x/week  PT DURATION: 8 weeks   PLANNED INTERVENTIONS: Therapeutic exercises, Therapeutic activity, Neuromuscular re-education, Balance training, Gait training, Patient/Family education, Self Care, Joint mobilization, Stair training, Orthotic/Fit training, DME instructions, Aquatic Therapy, Electrical stimulation, Spinal mobilization, Taping, Manual therapy, and Re-evaluation.  PLAN FOR NEXT SESSION: how is low back doing and wound?    continue with core stability, hip strengthening, knee strengthening, low back exercises   Ileana Ladd, PT 07/16/2023, 1:57 PM

## 2023-07-19 ENCOUNTER — Other Ambulatory Visit: Payer: Self-pay | Admitting: Physical Medicine & Rehabilitation

## 2023-07-20 ENCOUNTER — Ambulatory Visit: Payer: Medicare PPO

## 2023-07-20 DIAGNOSIS — M79604 Pain in right leg: Secondary | ICD-10-CM | POA: Diagnosis not present

## 2023-07-20 DIAGNOSIS — Z9181 History of falling: Secondary | ICD-10-CM | POA: Diagnosis not present

## 2023-07-20 DIAGNOSIS — R2681 Unsteadiness on feet: Secondary | ICD-10-CM | POA: Diagnosis not present

## 2023-07-20 DIAGNOSIS — R2689 Other abnormalities of gait and mobility: Secondary | ICD-10-CM

## 2023-07-20 NOTE — Therapy (Signed)
OUTPATIENT PHYSICAL THERAPY TREATMENT NOTE    Patient Name: Kristopher Gay MRN: [id number] DOB:[birth year], 72 y.o., male Today's Date: 07/20/2023  END OF SESSION:  PT End of Session - 07/20/23 1246     Visit Number 14    Number of Visits 16    Date for PT Re-Evaluation 07/28/23    Authorization Type [employer]    PT Start Time 1230    PT Stop Time 1315    PT Time Calculation (min) 45 min    Activity Tolerance Patient tolerated treatment well    Behavior During Therapy WFL for tasks assessed/performed                 Past Medical History:  Diagnosis Date   A-fib (HCC)    Allergic rhinitis, cause unspecified    Allergy    Anxiety    Blood clot in vein 2002   left calf SUPERFICIAL CLOT REMOVED THEN PART OF VEIN REMOVED   Chronic headaches    COPD (chronic obstructive pulmonary disease) (HCC)    Cough    X 1 YEAR NO FEVER CLEAR SPUTUM   DDD (degenerative disc disease)    Depression    DJD (degenerative joint disease)    ALL THE WAY DOWN SPINE   Dropfoot    LEFT , NO BRACE WORN NOW   Dysrhythmia    a fib one time after hernia surgery   Emphysema of lung (HCC)    Extrinsic asthma, unspecified    GERD (gastroesophageal reflux disease)    OCC NO MEDS FOR   Hypertension    STOPPED HTN MEDS UNTIL 2011, THEN HAD WEIGHT LOSS, NO MEDS SINCE   Neuromuscular disorder (HCC)    neuropathy  legs and feet   Neuropathy    POLYNEUOPATHOPATHY FEET AND LEGS   Peripheral vascular disease (HCC)    VARICOSE VEINS LEFT LEG   Pneumonia AS CHILD   PONV (postoperative nausea and vomiting)    Syncope    Ulnar nerve entrapment at elbow, right    Umbilical hernia    Unspecified asthma(493.90)    Past Surgical History:  Procedure Laterality Date   COLONSCOPY  06/16/2017   HERNIA REPAIR     INGUINAL HERNIA REPAIR Bilateral 07/20/2017   Procedure: LAPAROSCOPIC BILATERAL INGUINAL HERNIA REPAIR WITH MESH, UMBILICAL HERNIA REPAIR AND LYSIS OF ADHESIONS;  Surgeon: [name], De Blanch, MD;  Location: [facility];  Service: General;  Laterality: Bilateral;   LAPAROSCOPY N/A 07/20/2017   Procedure: LAPAROSCOPY DIAGNOSTIC;  Surgeon: Rodman Pickle, MD;  Location: [facility];  Service: General;  Laterality: N/A;   QUADRICEPS TENDON REPAIR Right 08/07/2020   Procedure: REPAIR QUADRICEP TENDON;  Surgeon: Sheral Apley, MD;  Location: [facility];  Service: Orthopedics;  Laterality: Right;  NEED RAPID TEST;SAME DAY WORKUP; COMING FROM [facility]   QUADRICEPS TENDON REPAIR Right 02/26/2021   Procedure: REPAIR QUADRICEP TENDON;  Surgeon: Sheral Apley, MD;  Location: [facility];  Service: Orthopedics;  Laterality: Right;   ROTATOR CUFF REPAIR Left 2005   detached; left shoulder-reattached   SHOULDER SURGERY Right    laBRAL  tendon torn   SUPERFICIAL LEFT LEG VEIN STRIPPING WITH SMALL SUPERFICIAL CLOT  2002   ULNAR NERVE TRANSPOSITION Right 05/10/2018   Procedure: RIGHT ELBOW ULNAR NERVE RELEASE;  Surgeon: Bradly Bienenstock, MD;  Location: [facility];  Service: Orthopedics;  Laterality: Right;   UMBILICAL HERNIA REPAIR  02/18/2018   UMBILICAL HERNIA REPAIR N/A 02/18/2018   Procedure:  LAPAROSCOPIC UMBILICAL HERNIA REPAIR ERAS PATHWAY;  Surgeon: [name], De Blanch, MD;  Location: [facility];  Service: General;  Laterality: N/A;   Patient Active Problem List   Diagnosis Date Noted   Rupture of quadriceps tendon, right, sequela 02/26/2021   Rupture of right quadriceps tendon 08/07/2020   Ulnar neuropathy at elbow, right 05/03/2018   PAF (paroxysmal atrial fibrillation) (HCC) 02/20/2018   Chronic right shoulder pain 05/15/2016   Right knee pain 05/15/2016   Adrenal adenoma 02/20/2016   Chronic pain syndrome 10/12/2015   Dysphagia, pharyngoesophageal phase 09/14/2014   Other chronic postoperative pain 01/16/2014   Allergy, insect bite 07/02/2012   GERD (gastroesophageal reflux disease) 03/25/2012   Injury to peroneal nerve 03/12/2012   Lumbosacral spondylosis 01/13/2012    Chronic back pain 11/17/2011   Seasonal and perennial allergic rhinitis 12/26/2010    ONSET DATE: 05/07/2023   REFERRING DIAG: M54.16 (ICD-10-CM) - Lumbar radiculopathy R20.0 (ICD-10-CM) - Bilateral leg numbness  PCP: No PCP  REFERRING PROVIDER: Glendale Chard, DO   Rationale for Evaluation and Treatment: Rehabilitation  THERAPY DIAG:  Unsteadiness on feet  Other abnormalities of gait and mobility  Pain of right lower extremity  ONSET DATE: 05/07/2023  SUBJECTIVE:                                                                                                                                                                                           SUBJECTIVE STATEMENT:  I cut my lawn and I am very fatigued all over. No falls. Pt denies pain in lumbar spine.  PERTINENT HISTORY:  PMH: A-fib, anxiety, chronic headaches, COPD, emphysema, HTN, Longstanding history of idiopathic neuropathy , peripheral vascular disease, hx of R quadriceps tendon repair in 2021/2022  Asymmetric right leg numbness maybe due to right lumbar radiculopathy.   PAIN:  PAIN:  Are you having pain? No   PRECAUTIONS: Fall, wound on L ankle    WEIGHT BEARING RESTRICTIONS: No  FALLS:  Has patient fallen in last 6 months? Yes. Number of falls 1 or 2 falls, can't recall how he fell, but knows it happens in the yard, can't remember the details   LIVING ENVIRONMENT:  Has following equipment at home: Single point cane, Walker - 2 wheeled, and doesn't use RW as much, but it is there.   PLOF: Independent  PATIENT GOALS: Wants to improve the numbness in his legs   NEXT MD VISIT: Jacalyn Lefevre, Phys Med on 08/18/23   OBJECTIVE:   DIAGNOSTIC FINDINGS:  MRI lumbar spine: 04/12/23: IMPRESSION: 1. No acute findings or clear explanation for the patient's symptoms. 2. Chronic multilevel spondylosis associated with  a convex right scoliosis, only minimally progressive from previous MRI from 2008. 3.  Chronic left foraminal narrowing at L3-4 with probable chronic left L3 nerve root encroachment. 4. Chronic right-greater-than-left foraminal narrowing at L4-5 and L5-S1.  PATIENT SURVEYS:  Modified Oswestry 06/26/23- Oswestry 27/50   COGNITION: Overall cognitive status: Within functional limits for tasks assessed     SENSATION: Light touch: Impaired  and due to neuropathy, more difficult to tell with RLE more distally, numbness down RLE Proprioception: WFL and able to detect at bilat ankles in PF/DF, but when tested on R foot, pt reports that it feels numb or an abnormal pain that it has to describe    POSTURE: rounded shoulders and posterior pelvic tilt Trunk flexed during gait    LOWER EXTREMITY ROM:     Limited R knee extension AROM due to hx of R quad tendon repairs   Active  Right eval Left eval Right 06/02/23 Active/passive Right 06/26/23 AROM   Hip flexion      Hip extension      Hip abduction      Hip adduction      Hip internal rotation      Hip external rotation      Knee flexion Limited, can only do about 90-95 degrees of knee flexion due to hx of quad tendon repairs  115/120   Knee extension   -25/0   Ankle dorsiflexion      Ankle plantarflexion      Ankle inversion      Ankle eversion       (Blank rows = not tested)  LOWER EXTREMITY MMT:    MMT Right eval Left eval Right 06/26/23 Left 06/26/23  Hip flexion 3+ 4- 3 knee pain  4+  Hip extension      Hip abduction      Hip adduction      Hip internal rotation      Hip external rotation      Knee flexion 4 4+    Knee extension 3- (pain in the knee) 4- 4 -(knee pain) 4  Ankle dorsiflexion 4 (pt reports incr pain) 5    Ankle plantarflexion      Ankle inversion      Ankle eversion       (Blank rows = not tested)  GAIT: Gait pattern: decreased stance time- Right, knee flexed in stance- Right, antalgic, trunk flexed, and wide BOS Distance walked: Clinic distances  Assistive device utilized: Single point  cane Level of assistance: SBA    TODAY'S TREATMENT:        Soft tissue and myofascial release to R thoracolumbar praspinalis with patient in L side lying; also worked on upper thoracic paraspinalis on R side R hip abduction: 3 x 15, 2 lbs Lower trunk rotations with red ball under bil legs: 20x R and L Supine hamstring curls with red ball (without bridge): 20x bil Supine ball rolls into lumbar flexion: 20x Supine R lat stretch with ball roll in flexion with L side roll: 5 x 10" Foam roll massage to R quads with gentle pressure: 5'   PATIENT EDUCATION:  Education details: Additions to HEP for low back - exercises that pt liked that he could perform in seated or standing  Person educated: Patient Education method: Explanation, Demonstration, Verbal cues, and Handouts Education comprehension: verbalized understanding and needs further education  HOME EXERCISE PROGRAM: Access Code: [email or web address] URL: [email or web address] Date: 07/07/2023 Prepared by: Lavone Nian  Exercises - Supine Single Knee to  Chest Stretch  - 2 x daily - 7 x weekly - 1 sets - 3 reps - 30 hold - Supine Bridge  - 1-2 x daily - 7 x weekly - 1 sets - 5-10 reps - 1 second hold - Supine Figure 4 Piriformis Stretch  - 2 x daily - 7 x weekly - 1 sets - 3 reps - 30 hold - Supine Posterior Pelvic Tilt  - 1-2 x daily - 7 x weekly - 1 sets - 10 reps - 2-3 seconds  hold - Clamshell  - 2 x daily - 7 x weekly - 2 sets - 10 reps - Standing Lumbar Spine Flexion Stretch Counter  - 1-2 x daily - 5 x weekly - 2 sets - 10 reps - Thoracic Rotation at Counter  - 1-2 x daily - 5 x weekly - 2 sets - 10 reps - Seated Quadratus Lumborum Stretch in Chair  - 1-2 x daily - 5 x weekly - 2 sets - 10 reps - Standing Lumbar Extension  - 1 x daily - 7 x weekly - 2 sets - 10 reps - Sidelying Hip Abduction  - 1 x daily - 7 x weekly - 2 sets - 10 reps   ASSESSMENT:  CLINICAL IMPRESSION: Pt is reporting improivng pain overall. Has  not reported significant lower back pain in last 2 sessions. Pt is hesitant with working on the knee so treatment sessions are modified to avoid any extra pressure on the knee for patient's discomfort.    OBJECTIVE IMPAIRMENTS: Abnormal gait, decreased activity tolerance, decreased balance, decreased endurance, decreased knowledge of use of DME, decreased mobility, difficulty walking, decreased ROM, decreased strength, hypomobility, impaired sensation, postural dysfunction, and pain.   ACTIVITY LIMITATIONS: standing, squatting, stairs, transfers, and locomotion level  PARTICIPATION LIMITATIONS: community activity  PERSONAL FACTORS: Age, Behavior pattern, Past/current experiences, Time since onset of injury/illness/exacerbation, and 3+ comorbidities:  A-fib, anxiety, chronic headaches, COPD, emphysema, HTN, Longstanding history of idiopathic neuropathy , peripheral vascular disease, hx of R quadriceps tendon repair in 2021/2022  are also affecting patient's functional outcome.   REHAB POTENTIAL: Good  CLINICAL DECISION MAKING: Evolving/moderate complexity  EVALUATION COMPLEXITY: Moderate   GOALS: Goals reviewed with patient? Yes  SHORT TERM GOALS: ALL STGS = LTGS  LONG TERM GOALS: Target date: 07/28/23  Pt will be independent with final HEP in order to build upon functional gains made in therapy. Baseline:  Goal status: ONGOING 06/26/23  2.  Pt will report 10% improvement on ODI to improve overall function Baseline:  Goal status: ONGOING 06/26/23  3.  Pt will demo >0.8 m/s gait speed without AD to improve community ambulation Baseline: 0.76 m/s without AD (06/08/23) Goal status: ONGOING, DNT 06/26/23  4.  Pt will demo <16 sec without UE support with 5x sit to stand to improve functional strength. Baseline: 21 sec (06/08/23); 06/26/23- 40 seconds  Goal status: ONGOING 06/26/23    PLAN:  PT FREQUENCY: 2x/week  PT DURATION: 8 weeks   PLANNED INTERVENTIONS: Therapeutic exercises,  Therapeutic activity, Neuromuscular re-education, Balance training, Gait training, Patient/Family education, Self Care, Joint mobilization, Stair training, Orthotic/Fit training, DME instructions, Aquatic Therapy, Electrical stimulation, Spinal mobilization, Taping, Manual therapy, and Re-evaluation.  PLAN FOR NEXT SESSION: how is low back doing and wound?    continue with core stability, hip strengthening, knee strengthening, low back exercises   Ileana Ladd, PT 07/20/2023, 12:47 PM

## 2023-07-23 ENCOUNTER — Ambulatory Visit: Payer: Medicare PPO

## 2023-07-23 DIAGNOSIS — R2689 Other abnormalities of gait and mobility: Secondary | ICD-10-CM | POA: Diagnosis not present

## 2023-07-23 DIAGNOSIS — M79604 Pain in right leg: Secondary | ICD-10-CM | POA: Diagnosis not present

## 2023-07-23 DIAGNOSIS — Z9181 History of falling: Secondary | ICD-10-CM | POA: Diagnosis not present

## 2023-07-23 DIAGNOSIS — R2681 Unsteadiness on feet: Secondary | ICD-10-CM | POA: Diagnosis not present

## 2023-07-27 ENCOUNTER — Ambulatory Visit: Payer: Medicare PPO

## 2023-07-27 DIAGNOSIS — R2681 Unsteadiness on feet: Secondary | ICD-10-CM

## 2023-07-27 DIAGNOSIS — Z9181 History of falling: Secondary | ICD-10-CM | POA: Diagnosis not present

## 2023-07-27 DIAGNOSIS — M79604 Pain in right leg: Secondary | ICD-10-CM | POA: Diagnosis not present

## 2023-07-27 DIAGNOSIS — R2689 Other abnormalities of gait and mobility: Secondary | ICD-10-CM

## 2023-07-27 NOTE — Therapy (Signed)
OUTPATIENT PHYSICAL THERAPY TREATMENT NOTE    Patient Name: [name] MRN: [id number] DOB:[birth year], 72 y.o., male Today's Date: 07/27/2023  END OF SESSION:  PT End of Session - 07/27/23 1350     Visit Number 16    Number of Visits 16    Date for PT Re-Evaluation 07/28/23    Authorization Type [employer]    PT Start Time 1315    PT Stop Time 1400    PT Time Calculation (min) 45 min    Activity Tolerance Patient tolerated treatment well    Behavior During Therapy WFL for tasks assessed/performed                  Past Medical History:  Diagnosis Date   A-fib (HCC)    Allergic rhinitis, cause unspecified    Allergy    Anxiety    Blood clot in vein 2002   left calf SUPERFICIAL CLOT REMOVED THEN PART OF VEIN REMOVED   Chronic headaches    COPD (chronic obstructive pulmonary disease) (HCC)    Cough    X 1 YEAR NO FEVER CLEAR SPUTUM   DDD (degenerative disc disease)    Depression    DJD (degenerative joint disease)    ALL THE WAY DOWN SPINE   Dropfoot    LEFT , NO BRACE WORN NOW   Dysrhythmia    a fib one time after hernia surgery   Emphysema of lung (HCC)    Extrinsic asthma, unspecified    GERD (gastroesophageal reflux disease)    OCC NO MEDS FOR   Hypertension    STOPPED HTN MEDS UNTIL 2011, THEN HAD WEIGHT LOSS, NO MEDS SINCE   Neuromuscular disorder (HCC)    neuropathy  legs and feet   Neuropathy    POLYNEUOPATHOPATHY FEET AND LEGS   Peripheral vascular disease (HCC)    VARICOSE VEINS LEFT LEG   Pneumonia AS CHILD   PONV (postoperative nausea and vomiting)    Syncope    Ulnar nerve entrapment at elbow, right    Umbilical hernia    Unspecified asthma(493.90)    Past Surgical History:  Procedure Laterality Date   COLONSCOPY  06/16/2017   HERNIA REPAIR     INGUINAL HERNIA REPAIR Bilateral 07/20/2017   Procedure: LAPAROSCOPIC BILATERAL INGUINAL HERNIA REPAIR WITH MESH, UMBILICAL HERNIA REPAIR AND LYSIS OF ADHESIONS;  Surgeon: Kristopher Gay;  Location: [facility];  Service: General;  Laterality: Bilateral;   LAPAROSCOPY N/A 07/20/2017   Procedure: LAPAROSCOPY DIAGNOSTIC;  Surgeon: Kristopher Gay;  Location: [facility];  Service: General;  Laterality: N/A;   QUADRICEPS TENDON REPAIR Right 08/07/2020   Procedure: REPAIR QUADRICEP TENDON;  Surgeon: Kristopher Gay;  Location: [facility];  Service: Orthopedics;  Laterality: Right;  NEED RAPID TEST;SAME DAY WORKUP; COMING FROM [facility]   QUADRICEPS TENDON REPAIR Right 02/26/2021   Procedure: REPAIR QUADRICEP TENDON;  Surgeon: Kristopher Gay;  Location: [facility];  Service: Orthopedics;  Laterality: Right;   ROTATOR CUFF REPAIR Left 2005   detached; left shoulder-reattached   SHOULDER SURGERY Right    laBRAL  tendon torn   SUPERFICIAL LEFT LEG VEIN STRIPPING WITH SMALL SUPERFICIAL CLOT  2002   ULNAR NERVE TRANSPOSITION Right 05/10/2018   Procedure: RIGHT ELBOW ULNAR NERVE RELEASE;  Surgeon: Kristopher Gay;  Location: [facility];  Service: Orthopedics;  Laterality: Right;   UMBILICAL HERNIA REPAIR  02/18/2018   UMBILICAL HERNIA REPAIR N/A 02/18/2018  Procedure: LAPAROSCOPIC UMBILICAL HERNIA REPAIR ERAS PATHWAY;  Surgeon: [name], De Blanch, Gay;  Location: [facility];  Service: General;  Laterality: N/A;   Patient Active Problem List   Diagnosis Date Noted   Rupture of quadriceps tendon, right, sequela 02/26/2021   Rupture of right quadriceps tendon 08/07/2020   Ulnar neuropathy at elbow, right 05/03/2018   PAF (paroxysmal atrial fibrillation) (HCC) 02/20/2018   Chronic right shoulder pain 05/15/2016   Right knee pain 05/15/2016   Adrenal adenoma 02/20/2016   Chronic pain syndrome 10/12/2015   Dysphagia, pharyngoesophageal phase 09/14/2014   Other chronic postoperative pain 01/16/2014   Allergy, insect bite 07/02/2012   GERD (gastroesophageal reflux disease) 03/25/2012   Injury to peroneal nerve 03/12/2012   Lumbosacral spondylosis 01/13/2012    Chronic back pain 11/17/2011   Seasonal and perennial allergic rhinitis 12/26/2010    ONSET DATE: 05/07/2023   REFERRING DIAG: M54.16 (ICD-10-CM) - Lumbar radiculopathy R20.0 (ICD-10-CM) - Bilateral leg numbness  PCP: No PCP  REFERRING PROVIDER: Glendale Chard, Gay   Rationale for Evaluation and Treatment: Rehabilitation  THERAPY DIAG:  Unsteadiness on feet  Other abnormalities of gait and mobility  Pain of right lower extremity  ONSET DATE: 05/07/2023  SUBJECTIVE:                                                                                                                                                                                           SUBJECTIVE STATEMENT:  No new complaints.   PERTINENT HISTORY:  PMH: A-fib, anxiety, chronic headaches, COPD, emphysema, HTN, Longstanding history of idiopathic neuropathy , peripheral vascular disease, hx of R quadriceps tendon repair in 2021/2022  Asymmetric right leg numbness maybe due to right lumbar radiculopathy.   PAIN:  PAIN:  Are you having pain? No   PRECAUTIONS: Fall, wound on L ankle    WEIGHT BEARING RESTRICTIONS: No  FALLS:  Has patient fallen in last 6 months? Yes. Number of falls 1 or 2 falls, can't recall how he fell, but knows it happens in the yard, can't remember the details   LIVING ENVIRONMENT:  Has following equipment at home: Single point cane, Walker - 2 wheeled, and doesn't use RW as much, but it is there.   PLOF: Independent  PATIENT GOALS: Wants to improve the numbness in his legs   NEXT Gay VISIT: Kristopher Gay on 08/18/23   OBJECTIVE:   DIAGNOSTIC FINDINGS:  MRI lumbar spine: 04/12/23: IMPRESSION: 1. No acute findings or clear explanation for the patient's symptoms. 2. Chronic multilevel spondylosis associated with a convex right scoliosis, only minimally progressive from previous MRI from 2008. 3. Chronic  left foraminal narrowing at L3-4 with probable chronic left L3  nerve root encroachment. 4. Chronic right-greater-than-left foraminal narrowing at L4-5 and L5-S1.  PATIENT SURVEYS:  Modified Oswestry 06/26/23- Oswestry 27/50   COGNITION: Overall cognitive status: Within functional limits for tasks assessed     SENSATION: Light touch: Impaired  and due to neuropathy, more difficult to tell with RLE more distally, numbness down RLE Proprioception: WFL and able to detect at bilat ankles in PF/DF, but when tested on R foot, pt reports that it feels numb or an abnormal pain that it has to describe    POSTURE: rounded shoulders and posterior pelvic tilt Trunk flexed during gait    LOWER EXTREMITY ROM:     Limited R knee extension AROM due to hx of R quad tendon repairs   Active  Right eval Left eval Right 06/02/23 Active/passive Right 06/26/23 AROM   Hip flexion      Hip extension      Hip abduction      Hip adduction      Hip internal rotation      Hip external rotation      Knee flexion Limited, can only Gay about 90-95 degrees of knee flexion due to hx of quad tendon repairs  115/120   Knee extension   -25/0   Ankle dorsiflexion      Ankle plantarflexion      Ankle inversion      Ankle eversion       (Blank rows = not tested)  LOWER EXTREMITY MMT:    MMT Right eval Left eval Right 06/26/23 Left 06/26/23  Hip flexion 3+ 4- 3 knee pain  4+  Hip extension      Hip abduction      Hip adduction      Hip internal rotation      Hip external rotation      Knee flexion 4 4+    Knee extension 3- (pain in the knee) 4- 4 -(knee pain) 4  Ankle dorsiflexion 4 (pt reports incr pain) 5    Ankle plantarflexion      Ankle inversion      Ankle eversion       (Blank rows = not tested)  GAIT: Gait pattern: decreased stance time- Right, knee flexed in stance- Right, antalgic, trunk flexed, and wide BOS Distance walked: Clinic distances  Assistive device utilized: Single point cane Level of assistance: SBA    TODAY'S TREATMENT:        Soft  tissue, Instrument assisted soft tissue mobilization and myofascial release to R thoracolumbar praspinalis with patient in L side lying; also worked on upper thoracic paraspinalis on R side  R hip abduction: 3 x 20, 2 lbs Bil sciatic nerve flossing with feet on ball: knees extended and ankle dorsiflexe: 20x R and L Standing lumbar extensions against countertop: tactile pressure at T8 level to promote movement of extension there: 2 x 10 Lower trunk rotations with red ball under bil legs: 20x R and L Bil knee flexion with feet on ball: without bridge: 20x   PATIENT EDUCATION:  Education details: Additions to HEP for low back - exercises that pt liked that he could perform in seated or standing  Person educated: Patient Education method: Explanation, Demonstration, Verbal cues, and Handouts Education comprehension: verbalized understanding and needs further education  HOME EXERCISE PROGRAM: Access Code: [email or web address] URL: [email or web address] Date: 07/07/2023 Prepared by: Lavone Nian  Exercises - Supine Single Knee to Chest Stretch  - 2  x daily - 7 x weekly - 1 sets - 3 reps - 30 hold - Supine Bridge  - 1-2 x daily - 7 x weekly - 1 sets - 5-10 reps - 1 second hold - Supine Figure 4 Piriformis Stretch  - 2 x daily - 7 x weekly - 1 sets - 3 reps - 30 hold - Supine Posterior Pelvic Tilt  - 1-2 x daily - 7 x weekly - 1 sets - 10 reps - 2-3 seconds  hold - Clamshell  - 2 x daily - 7 x weekly - 2 sets - 10 reps - Standing Lumbar Spine Flexion Stretch Counter  - 1-2 x daily - 5 x weekly - 2 sets - 10 reps - Thoracic Rotation at Counter  - 1-2 x daily - 5 x weekly - 2 sets - 10 reps - Seated Quadratus Lumborum Stretch in Chair  - 1-2 x daily - 5 x weekly - 2 sets - 10 reps - Standing Lumbar Extension  - 1 x daily - 7 x weekly - 2 sets - 10 reps - Sidelying Hip Abduction  - 1 x daily - 7 x weekly - 2 sets - 10 reps   ASSESSMENT:  CLINICAL IMPRESSION: Overall soft tissue mobility  is improving. There is still localized area in pain but patient is able to perform most of his daily tasks and even recreational activities without significant back pain.   OBJECTIVE IMPAIRMENTS: Abnormal gait, decreased activity tolerance, decreased balance, decreased endurance, decreased knowledge of use of DME, decreased mobility, difficulty walking, decreased ROM, decreased strength, hypomobility, impaired sensation, postural dysfunction, and pain.   ACTIVITY LIMITATIONS: standing, squatting, stairs, transfers, and locomotion level  PARTICIPATION LIMITATIONS: community activity  PERSONAL FACTORS: Age, Behavior pattern, Past/current experiences, Time since onset of injury/illness/exacerbation, and 3+ comorbidities:  A-fib, anxiety, chronic headaches, COPD, emphysema, HTN, Longstanding history of idiopathic neuropathy , peripheral vascular disease, hx of R quadriceps tendon repair in 2021/2022  are also affecting patient's functional outcome.   REHAB POTENTIAL: Good  CLINICAL DECISION MAKING: Evolving/moderate complexity  EVALUATION COMPLEXITY: Moderate   GOALS: Goals reviewed with patient? Yes  SHORT TERM GOALS: ALL STGS = LTGS  LONG TERM GOALS: Target date: 07/28/23  Pt will be independent with final HEP in order to build upon functional gains made in therapy. Baseline:  Goal status: ONGOING 06/26/23  2.  Pt will report 10% improvement on ODI to improve overall function Baseline:  Goal status: ONGOING 06/26/23  3.  Pt will demo >0.8 m/s gait speed without AD to improve community ambulation Baseline: 0.76 m/s without AD (06/08/23) Goal status: ONGOING, DNT 06/26/23  4.  Pt will demo <16 sec without UE support with 5x sit to stand to improve functional strength. Baseline: 21 sec (06/08/23); 06/26/23- 40 seconds  Goal status: ONGOING 06/26/23    PLAN:  PT FREQUENCY: 2x/week  PT DURATION: 8 weeks   PLANNED INTERVENTIONS: Therapeutic exercises, Therapeutic activity,  Neuromuscular re-education, Balance training, Gait training, Patient/Family education, Self Care, Joint mobilization, Stair training, Orthotic/Fit training, DME instructions, Aquatic Therapy, Electrical stimulation, Spinal mobilization, Taping, Manual therapy, and Re-evaluation.  PLAN FOR NEXT SESSION: how is low back doing and wound?    continue with core stability, hip strengthening, knee strengthening, low back exercises   Ileana Ladd, PT 07/27/2023, 1:51 PM

## 2023-07-28 ENCOUNTER — Ambulatory Visit: Payer: Medicare PPO | Attending: Neurology

## 2023-07-28 DIAGNOSIS — R2689 Other abnormalities of gait and mobility: Secondary | ICD-10-CM | POA: Diagnosis not present

## 2023-07-28 DIAGNOSIS — R2681 Unsteadiness on feet: Secondary | ICD-10-CM | POA: Diagnosis not present

## 2023-07-28 DIAGNOSIS — M79604 Pain in right leg: Secondary | ICD-10-CM | POA: Diagnosis not present

## 2023-07-28 NOTE — Therapy (Signed)
OUTPATIENT PHYSICAL THERAPY TREATMENT NOTE   PHYSICAL THERAPY DISCHARGE SUMMARY  Visits from Start of Care: 17  Current functional level related to goals / functional outcomes: Pt is [facility] with ADLs, transfers, and recreational activities   Remaining deficits: L LE (knee) weakness   Education / Equipment: HEP   Patient agrees to discharge. Patient goals were partially met. Patient is being discharged due to maximized rehab potential.    Patient Name: Kristopher Gay MRN: [id number] DOB:11-Jul-1951, 72 y.o., male Today's Date: 07/28/2023  END OF SESSION:  PT End of Session - 07/28/23 1133     Visit Number 17    Number of Visits 16    Date for PT Re-Evaluation 07/28/23    Authorization Type [employer]    PT Start Time 1105    PT Stop Time 1145    PT Time Calculation (min) 40 min    Activity Tolerance Patient tolerated treatment well    Behavior During Therapy WFL for tasks assessed/performed                  Past Medical History:  Diagnosis Date   A-fib (HCC)    Allergic rhinitis, cause unspecified    Allergy    Anxiety    Blood clot in vein 2002   left calf SUPERFICIAL CLOT REMOVED THEN PART OF VEIN REMOVED   Chronic headaches    COPD (chronic obstructive pulmonary disease) (HCC)    Cough    X 1 YEAR NO FEVER CLEAR SPUTUM   DDD (degenerative disc disease)    Depression    DJD (degenerative joint disease)    ALL THE WAY DOWN SPINE   Dropfoot    LEFT , NO BRACE WORN NOW   Dysrhythmia    a fib one time after hernia surgery   Emphysema of lung (HCC)    Extrinsic asthma, unspecified    GERD (gastroesophageal reflux disease)    OCC NO MEDS FOR   Hypertension    STOPPED HTN MEDS UNTIL 2011, THEN HAD WEIGHT LOSS, NO MEDS SINCE   Neuromuscular disorder (HCC)    neuropathy  legs and feet   Neuropathy    POLYNEUOPATHOPATHY FEET AND LEGS   Peripheral vascular disease (HCC)    VARICOSE VEINS LEFT LEG   Pneumonia AS CHILD   PONV (postoperative nausea  and vomiting)    Syncope    Ulnar nerve entrapment at elbow, right    Umbilical hernia    Unspecified asthma(493.90)    Past Surgical History:  Procedure Laterality Date   COLONSCOPY  06/16/2017   HERNIA REPAIR     INGUINAL HERNIA REPAIR Bilateral 07/20/2017   Procedure: LAPAROSCOPIC BILATERAL INGUINAL HERNIA REPAIR WITH MESH, UMBILICAL HERNIA REPAIR AND LYSIS OF ADHESIONS;  Surgeon: [name], De Blanch, MD;  Location: [facility];  Service: General;  Laterality: Bilateral;   LAPAROSCOPY N/A 07/20/2017   Procedure: LAPAROSCOPY DIAGNOSTIC;  Surgeon: Rodman Pickle, MD;  Location: [facility];  Service: General;  Laterality: N/A;   QUADRICEPS TENDON REPAIR Right 08/07/2020   Procedure: REPAIR QUADRICEP TENDON;  Surgeon: Sheral Apley, MD;  Location: [facility];  Service: Orthopedics;  Laterality: Right;  NEED RAPID TEST;SAME DAY WORKUP; COMING FROM [facility]   QUADRICEPS TENDON REPAIR Right 02/26/2021   Procedure: REPAIR QUADRICEP TENDON;  Surgeon: Sheral Apley, MD;  Location: [facility];  Service: Orthopedics;  Laterality: Right;   ROTATOR CUFF REPAIR Left 2005   detached; left shoulder-reattached   SHOULDER SURGERY Right  laBRAL  tendon torn   SUPERFICIAL LEFT LEG VEIN STRIPPING WITH SMALL SUPERFICIAL CLOT  2002   ULNAR NERVE TRANSPOSITION Right 05/10/2018   Procedure: RIGHT ELBOW ULNAR NERVE RELEASE;  Surgeon: Bradly Bienenstock, MD;  Location: [facility] OR;  Service: Orthopedics;  Laterality: Right;   UMBILICAL HERNIA REPAIR  02/18/2018   UMBILICAL HERNIA REPAIR N/A 02/18/2018   Procedure: LAPAROSCOPIC UMBILICAL HERNIA REPAIR ERAS PATHWAY;  Surgeon: [name], De Blanch, MD;  Location: [facility] OR;  Service: General;  Laterality: N/A;   Patient Active Problem List   Diagnosis Date Noted   Rupture of quadriceps tendon, right, sequela 02/26/2021   Rupture of right quadriceps tendon 08/07/2020   Ulnar neuropathy at elbow, right 05/03/2018   PAF (paroxysmal atrial fibrillation) (HCC)  02/20/2018   Chronic right shoulder pain 05/15/2016   Right knee pain 05/15/2016   Adrenal adenoma 02/20/2016   Chronic pain syndrome 10/12/2015   Dysphagia, pharyngoesophageal phase 09/14/2014   Other chronic postoperative pain 01/16/2014   Allergy, insect bite 07/02/2012   GERD (gastroesophageal reflux disease) 03/25/2012   Injury to peroneal nerve 03/12/2012   Lumbosacral spondylosis 01/13/2012   Chronic back pain 11/17/2011   Seasonal and perennial allergic rhinitis 12/26/2010    ONSET DATE: 05/07/2023   REFERRING DIAG: M54.16 (ICD-10-CM) - Lumbar radiculopathy R20.0 (ICD-10-CM) - Bilateral leg numbness  PCP: No PCP  REFERRING PROVIDER: Glendale Chard, DO   Rationale for Evaluation and Treatment: Rehabilitation  THERAPY DIAG:  Unsteadiness on feet  Other abnormalities of gait and mobility  Pain of right lower extremity  ONSET DATE: 05/07/2023  SUBJECTIVE:                                                                                                                                                                                           SUBJECTIVE STATEMENT:  No new complaints.   PERTINENT HISTORY:  PMH: A-fib, anxiety, chronic headaches, COPD, emphysema, HTN, Longstanding history of idiopathic neuropathy , peripheral vascular disease, hx of R quadriceps tendon repair in 2021/2022  Asymmetric right leg numbness maybe due to right lumbar radiculopathy.   PAIN:  PAIN:  Are you having pain? No   PRECAUTIONS: Fall, wound on L ankle    WEIGHT BEARING RESTRICTIONS: No  FALLS:  Has patient fallen in last 6 months? Yes. Number of falls 1 or 2 falls, can't recall how he fell, but knows it happens in the yard, can't remember the details   LIVING ENVIRONMENT:  Has following equipment at home: Single point cane, Walker - 2 wheeled, and doesn't use RW as much, but it is there.   PLOF: Independent  PATIENT GOALS:  Wants to improve the numbness in his legs    NEXT MD VISIT: Jacalyn Lefevre, Phys Med on 08/18/23   OBJECTIVE:   DIAGNOSTIC FINDINGS:  MRI lumbar spine: 04/12/23: IMPRESSION: 1. No acute findings or clear explanation for the patient's symptoms. 2. Chronic multilevel spondylosis associated with a convex right scoliosis, only minimally progressive from previous MRI from 2008. 3. Chronic left foraminal narrowing at L3-4 with probable chronic left L3 nerve root encroachment. 4. Chronic right-greater-than-left foraminal narrowing at L4-5 and L5-S1.  PATIENT SURVEYS:  Modified Oswestry 06/26/23- Oswestry 27/50   COGNITION: Overall cognitive status: Within functional limits for tasks assessed     SENSATION: Light touch: Impaired  and due to neuropathy, more difficult to tell with RLE more distally, numbness down RLE Proprioception: WFL and able to detect at bilat ankles in PF/DF, but when tested on R foot, pt reports that it feels numb or an abnormal pain that it has to describe    POSTURE: rounded shoulders and posterior pelvic tilt Trunk flexed during gait    LOWER EXTREMITY ROM:     Limited R knee extension AROM due to hx of R quad tendon repairs   Active  Right eval Left eval Right 06/02/23 Active/passive Right 06/26/23 AROM   Hip flexion      Hip extension      Hip abduction      Hip adduction      Hip internal rotation      Hip external rotation      Knee flexion Limited, can only do about 90-95 degrees of knee flexion due to hx of quad tendon repairs  115/120   Knee extension   -25/0   Ankle dorsiflexion      Ankle plantarflexion      Ankle inversion      Ankle eversion       (Blank rows = not tested)  LOWER EXTREMITY MMT:    MMT Right eval Left eval Right 06/26/23 Left 06/26/23  Hip flexion 3+ 4- 3 knee pain  4+  Hip extension      Hip abduction      Hip adduction      Hip internal rotation      Hip external rotation      Knee flexion 4 4+    Knee extension 3- (pain in the knee) 4- 4 -(knee pain) 4   Ankle dorsiflexion 4 (pt reports incr pain) 5    Ankle plantarflexion      Ankle inversion      Ankle eversion       (Blank rows = not tested)  GAIT: Gait pattern: decreased stance time- Right, knee flexed in stance- Right, antalgic, trunk flexed, and wide BOS Distance walked: Clinic distances  Assistive device utilized: Single point cane Level of assistance: SBA    TODAY'S TREATMENT:        Soft tissue, Instrument assisted soft tissue mobilization and myofascial release to R thoracolumbar praspinalis with patient in L side lying; also worked on upper thoracic paraspinalis on R side  R hip abduction: 3 x 20, 2 lbs Bil sciatic nerve flossing with feet on ball: knees extended and ankle dorsiflexe: 20x R and L Standing lumbar extensions against countertop: tactile pressure at T8 level to promote movement of extension there: 2 x 10 Lower trunk rotations with red ball under bil legs: 20x R and L Bil knee flexion with feet on ball: without bridge: 20x   PATIENT EDUCATION:  Education details: Additions to HEP  for low back - exercises that pt liked that he could perform in seated or standing  Person educated: Patient Education method: Explanation, Demonstration, Verbal cues, and Handouts Education comprehension: verbalized understanding and needs further education  HOME EXERCISE PROGRAM: Access Code: [email or web address] URL: [email or web address] Date: 07/07/2023 Prepared by: Lavone Nian  Exercises - Supine Single Knee to Chest Stretch  - 2 x daily - 7 x weekly - 1 sets - 3 reps - 30 hold - Supine Bridge  - 1-2 x daily - 7 x weekly - 1 sets - 5-10 reps - 1 second hold - Supine Figure 4 Piriformis Stretch  - 2 x daily - 7 x weekly - 1 sets - 3 reps - 30 hold - Supine Posterior Pelvic Tilt  - 1-2 x daily - 7 x weekly - 1 sets - 10 reps - 2-3 seconds  hold - Clamshell  - 2 x daily - 7 x weekly - 2 sets - 10 reps - Standing Lumbar Spine Flexion Stretch Counter  - 1-2 x daily -  5 x weekly - 2 sets - 10 reps - Thoracic Rotation at Counter  - 1-2 x daily - 5 x weekly - 2 sets - 10 reps - Seated Quadratus Lumborum Stretch in Chair  - 1-2 x daily - 5 x weekly - 2 sets - 10 reps - Standing Lumbar Extension  - 1 x daily - 7 x weekly - 2 sets - 10 reps - Sidelying Hip Abduction  - 1 x daily - 7 x weekly - 2 sets - 10 reps  Access Code: [email or web address] URL: [email or web address] Date: 07/28/2023 Prepared by: Lavone Nian  Exercises - Supine Lower Trunk Rotation with Swiss Ball  - 1 x daily - 7 x weekly - 2 sets - 10 reps - Supine Hip and Knee Flexion AROM with Swiss Ball  - 1 x daily - 7 x weekly - 2 sets - 10 reps - Supine Lumbar Rotation with Swiss Ball  - 1 x daily - 7 x weekly - 2 sets - 10 reps  ASSESSMENT:  CLINICAL IMPRESSION: Patient has met his long term goals partially. Patient currently has reached his maximum potential in therapy with his lower back and will be discharged from skilled PT.   OBJECTIVE IMPAIRMENTS: Abnormal gait, decreased activity tolerance, decreased balance, decreased endurance, decreased knowledge of use of DME, decreased mobility, difficulty walking, decreased ROM, decreased strength, hypomobility, impaired sensation, postural dysfunction, and pain.   ACTIVITY LIMITATIONS: standing, squatting, stairs, transfers, and locomotion level  PARTICIPATION LIMITATIONS: community activity  PERSONAL FACTORS: Age, Behavior pattern, Past/current experiences, Time since onset of injury/illness/exacerbation, and 3+ comorbidities:  A-fib, anxiety, chronic headaches, COPD, emphysema, HTN, Longstanding history of idiopathic neuropathy , peripheral vascular disease, hx of R quadriceps tendon repair in 2021/2022  are also affecting patient's functional outcome.   REHAB POTENTIAL: Good  CLINICAL DECISION MAKING: Evolving/moderate complexity  EVALUATION COMPLEXITY: Moderate   GOALS: Goals reviewed with patient? Yes  SHORT TERM GOALS: ALL  STGS = LTGS  LONG TERM GOALS: Target date: 07/28/23  Pt will be independent with final HEP in order to build upon functional gains made in therapy. Baseline:  Goal status: ONGOING 06/26/23  2.  Pt will report 10% improvement on ODI to improve overall function Baseline:  Goal status: ONGOING 06/26/23  3.  Pt will demo >0.8 m/s gait speed without AD to improve community ambulation Baseline: 0.76 m/s without AD (06/08/23); 0.6 m/s with st. Cane (07/28/23) Goal status: ONGOING, DNT 06/26/23  4.  Pt will demo <16 sec without UE support with 5x sit to stand to improve functional strength. Baseline: 21 sec (06/08/23); 06/26/23- 40 seconds ; 26 sec (07/28/23) Goal status: ONGOING 06/26/23    PLAN:  dISCHARGE FROM SKILLED PT  Ileana Ladd, PT 07/28/2023, 11:35 AM

## 2023-08-02 ENCOUNTER — Other Ambulatory Visit: Payer: Self-pay | Admitting: Internal Medicine

## 2023-08-13 ENCOUNTER — Ambulatory Visit (HOSPITAL_COMMUNITY)
Admission: EM | Admit: 2023-08-13 | Discharge: 2023-08-13 | Disposition: A | Discharge status: Home / Self Care | Payer: Medicare PPO | Attending: Internal Medicine | Admitting: Internal Medicine

## 2023-08-13 ENCOUNTER — Encounter (HOSPITAL_COMMUNITY): Payer: Self-pay

## 2023-08-13 DIAGNOSIS — L97322 Non-pressure chronic ulcer of left ankle with fat layer exposed: Secondary | ICD-10-CM

## 2023-08-13 DIAGNOSIS — I872 Venous insufficiency (chronic) (peripheral): Secondary | ICD-10-CM | POA: Diagnosis not present

## 2023-08-13 DIAGNOSIS — L03116 Cellulitis of left lower limb: Secondary | ICD-10-CM | POA: Diagnosis not present

## 2023-08-13 MED ORDER — SULFAMETHOXAZOLE-TRIMETHOPRIM 800-160 MG PO TABS: 1.0000 | ORAL_TABLET | Freq: Two times a day (BID) | ORAL | 0 refills | Status: AC

## 2023-08-13 MED ORDER — BACITRACIN ZINC 500 UNIT/GM EX OINT
TOPICAL_OINTMENT | CUTANEOUS | Status: AC
  Filled 2023-08-13: qty 0.9

## 2023-08-13 NOTE — ED Triage Notes (Signed)
Pt presents with c/o lt leg swelling and possible wound infection. Pt states he does not think he has cellulitis because he is not in pain.

## 2023-08-13 NOTE — Discharge Instructions (Addendum)
Take bactrim antibiotic twice a day for 7 days to treat infected wound. Apply mupirocin ointment twice a day for 7 days. Call wound care center to schedule an appointment for follow-up and ongoing management of chronic wound.  If your wound worsens or becomes more painful, red, swollen, or with more white drainage, please seek medical care. If you start having fever, chills, nausea, vomiting, etc, please go to the ER.

## 2023-08-13 NOTE — ED Provider Notes (Addendum)
[facility]    CSN: [id number] Arrival date & time: 08/13/23  1707      History   Chief Complaint Chief Complaint  Patient presents with   Wound Check    HPI Kristopher Gay is a 72 y.o. male.   Kristopher Gay is a 72 y.o. male with history of peripheral vascular disease and polyneuropathy presenting for chief complaint of wound check.  He has had a chronic nonpressure ulcer to the medial aspect of the left ankle for the last few months that has been fluctuating in pain and drainage.  He has been treated twice for cellulitis to the wound (once with doxycycline on June 10, 2023, then with ciprofloxacin for 10 days starting on June 15, 2023).  States after the ciprofloxacin antibiotic, the wound improved significantly and so did the swelling but the wound never fully healed.  Over the last 2 to 3 days, he has noticed worsening swelling to the left ankle/lower leg with some new white drainage from the wound. No fevers, chills, nausea, vomiting. He was given info for wound care center at last visit but never called since his wound was getting better.      Past Medical History:  Diagnosis Date   A-fib [facility])    Allergic rhinitis, cause unspecified    Allergy    Anxiety    Blood clot in vein 2002   left calf SUPERFICIAL CLOT REMOVED THEN PART OF VEIN REMOVED   Chronic headaches    COPD (chronic obstructive pulmonary disease) (HCC)    Cough    X 1 YEAR NO FEVER CLEAR SPUTUM   DDD (degenerative disc disease)    Depression    DJD (degenerative joint disease)    ALL THE WAY DOWN SPINE   Dropfoot    LEFT , NO BRACE WORN NOW   Dysrhythmia    a fib one time after hernia surgery   Emphysema of lung (HCC)    Extrinsic asthma, unspecified    GERD (gastroesophageal reflux disease)    OCC NO MEDS FOR   Hypertension    STOPPED HTN MEDS UNTIL 2011, THEN HAD WEIGHT LOSS, NO MEDS SINCE   Neuromuscular disorder (HCC)    neuropathy  legs and feet   Neuropathy     POLYNEUOPATHOPATHY FEET AND LEGS   Peripheral vascular disease (HCC)    VARICOSE VEINS LEFT LEG   Pneumonia AS CHILD   PONV (postoperative nausea and vomiting)    Syncope    Ulnar nerve entrapment at elbow, right    Umbilical hernia    Unspecified asthma(493.90)     Patient Active Problem List   Diagnosis Date Noted   Rupture of quadriceps tendon, right, sequela 02/26/2021   Rupture of right quadriceps tendon 08/07/2020   Ulnar neuropathy at elbow, right 05/03/2018   PAF (paroxysmal atrial fibrillation) (HCC) 02/20/2018   Chronic right shoulder pain 05/15/2016   Right knee pain 05/15/2016   Adrenal adenoma 02/20/2016   Chronic pain syndrome 10/12/2015   Dysphagia, pharyngoesophageal phase 09/14/2014   Other chronic postoperative pain 01/16/2014   Allergy, insect bite 07/02/2012   GERD (gastroesophageal reflux disease) 03/25/2012   Injury to peroneal nerve 03/12/2012   Lumbosacral spondylosis 01/13/2012   Chronic back pain 11/17/2011   Seasonal and perennial allergic rhinitis 12/26/2010    Past Surgical History:  Procedure Laterality Date   COLONSCOPY  06/16/2017   HERNIA REPAIR     INGUINAL HERNIA REPAIR Bilateral 07/20/2017   Procedure:  LAPAROSCOPIC BILATERAL INGUINAL HERNIA REPAIR WITH MESH, UMBILICAL HERNIA REPAIR AND LYSIS OF ADHESIONS;  Surgeon: [name], De Blanch, MD;  Location: [facility];  Service: General;  Laterality: Bilateral;   LAPAROSCOPY N/A 07/20/2017   Procedure: LAPAROSCOPY DIAGNOSTIC;  Surgeon: Sheliah Hatch De Blanch, MD;  Location: [facility];  Service: General;  Laterality: N/A;   QUADRICEPS TENDON REPAIR Right 08/07/2020   Procedure: REPAIR QUADRICEP TENDON;  Surgeon: Sheral Apley, MD;  Location: [facility];  Service: Orthopedics;  Laterality: Right;  NEED RAPID TEST;SAME DAY WORKUP; COMING FROM [facility]   QUADRICEPS TENDON REPAIR Right 02/26/2021   Procedure: REPAIR QUADRICEP TENDON;  Surgeon: Sheral Apley, MD;  Location: [facility];  Service:  Orthopedics;  Laterality: Right;   ROTATOR CUFF REPAIR Left 2005   detached; left shoulder-reattached   SHOULDER SURGERY Right    laBRAL  tendon torn   SUPERFICIAL LEFT LEG VEIN STRIPPING WITH SMALL SUPERFICIAL CLOT  2002   ULNAR NERVE TRANSPOSITION Right 05/10/2018   Procedure: RIGHT ELBOW ULNAR NERVE RELEASE;  Surgeon: Bradly Bienenstock, MD;  Location: [facility];  Service: Orthopedics;  Laterality: Right;   UMBILICAL HERNIA REPAIR  02/18/2018   UMBILICAL HERNIA REPAIR N/A 02/18/2018   Procedure: LAPAROSCOPIC UMBILICAL HERNIA REPAIR ERAS PATHWAY;  Surgeon: [name], De Blanch, MD;  Location: [facility];  Service: General;  Laterality: N/A;       Home Medications    Prior to Admission medications   Medication Sig Start Date End Date Taking? Authorizing Provider  sulfamethoxazole-trimethoprim (BACTRIM DS) 800-160 MG tablet Take 1 tablet by mouth 2 (two) times daily for 7 days. 08/13/23 08/20/23 Yes StanhopeDonavan Burnet, FNP  acetaminophen (TYLENOL) 500 MG tablet Take 2 tablets (1,000 mg total) by mouth every 6 (six) hours as needed for mild pain or moderate pain. 03/01/21   Jenne Pane, PA-C  ascorbic acid (VITAMIN C) 1000 MG tablet Take 2,000 mg by mouth daily.    [provider]  atorvastatin (LIPITOR) 40 MG tablet TAKE 1 TABLET(40 MG) BY MOUTH DAILY 05/18/23   Carlos Levering, NP  Azelastine-Fluticasone 137-50 MCG/ACT SUSP 1-2 puffs each nostril twice daily as needed Patient not taking: Reported on 03/27/2023 12/22/22   Waymon Budge, MD  Calcium Carbonate-Vit D-Min (CALCIUM 1200 PO) Take 1 tablet by mouth in the morning and at bedtime.    [provider]  celecoxib (CELEBREX) 100 MG capsule TAKE 1 CAPSULE BY MOUTH TWICE DAILY 07/07/23   Jones Bales, NP  Cholecalciferol (VITAMIN D3) 50 MCG (2000 UT) capsule Take 2,000 Units by mouth in the morning and at bedtime.    [provider]  cyanocobalamin 1000 MCG tablet Take 1,000 mcg by mouth in the morning and at  bedtime.    [provider]  DHEA 25 MG CAPS Take 25 mg by mouth daily.    [provider]  docusate sodium (DOK) 100 MG capsule     [provider]  EPINEPHrine 0.3 mg/0.3 mL IJ SOAJ injection Inject 0.3 mg into the muscle as needed for anaphylaxis.    [provider]  fluticasone-salmeterol [facility] INHUB) 500-50 MCG/ACT AEPB     [provider]  Fluticasone-Umeclidin-Vilant (TRELEGY ELLIPTA) 100-62.5-25 MCG/ACT AEPB INHALE 1 PUFF INTO THE LUNGS DAILY 08/05/23   Jetty Duhamel D, MD  furosemide (LASIX) 20 MG tablet TAKE 1 TABLET(20 MG) BY MOUTH DAILY 02/12/23   Ronnald Nian, MD  gabapentin (NEURONTIN) 300 MG capsule TAKE 2 CAPSULE BY MOUTH EVERY MORNING AND  1 CAPSULE MID DAY AND 2 CAPSULE AT [facility] 07/21/23   Jones Bales, NP  Glucosamine HCl-MSM (GLUCOSAMINE-MSM PO) Take 1 tablet by mouth in the morning and at bedtime.    [provider]  ibuprofen (ADVIL) 800 MG tablet     [provider]  Magnesium 250 MG TABS Take 250 mg by mouth daily.    [provider]  metoprolol succinate (TOPROL-XL) 25 MG 24 hr tablet TAKE 1 TABLET(25 MG) BY MOUTH DAILY 07/06/23   [name], Lisette Abu, MD  montelukast (SINGULAIR) 10 MG tablet TAKE 1 TABLET(10 MG) BY MOUTH DAILY 02/12/23   Ronnald Nian, MD  Multiple Vitamin (MULTIVITAMIN) tablet Take 1 tablet by mouth 2 (two) times daily.     [provider]  nitroGLYCERIN (NITROSTAT) 0.4 MG SL tablet Place 0.4 mg under the tongue every 5 (five) minutes as needed for chest pain.    [provider]  Omega-3 Fatty Acids (FISH OIL) 1200 MG CAPS Take 1,200 mg by mouth in the morning and at bedtime.    [provider]  Polyethylene Glycol 3350 (MIRALAX PO)     [provider]  traMADol (ULTRAM) 50 MG tablet TAKE 2 TABLETS BY MOUTH EVERY 6 HOURS AS NEEDED FOR PAIN 07/07/23   Jones Bales, NP  vitamin E 180 MG (400 UNITS) capsule Take 400 Units by mouth in the morning  and at bedtime.    [provider]    Family History Family History  Problem Relation Age of Onset   Pancreatic cancer Mother    Breast cancer Sister    Epilepsy Brother    Adrenal disorder Neg Hx     Social History Social History   Tobacco Use   Smoking status: Never   Smokeless tobacco: Never  Vaping Use   Vaping status: Never Used  Substance Use Topics   Alcohol use: No    Alcohol/week: 0.0 standard drinks of alcohol   Drug use: No     Allergies   Bee venom, Linaclotide, Molds & smuts, and Seasonal ic [cholestatin]   Review of Systems Review of Systems Per HPI  Physical Exam Triage Vital Signs ED Triage Vitals  Encounter Vitals Group     BP 08/13/23 1854 (!) 149/85     Systolic BP Percentile --      Diastolic BP Percentile --      Pulse Rate 08/13/23 1854 67     Resp 08/13/23 1854 16     Temp 08/13/23 1854 98.9 F (37.2 C)     Temp Source 08/13/23 1854 Oral     SpO2 08/13/23 1854 97 %     Weight --      Height --      Head Circumference --      Peak Flow --      Pain Score 08/13/23 1853 0     Pain Loc --      Pain Education --      Exclude from Growth Chart --    No data found.  Updated Vital Signs BP (!) 149/85 (BP Location: Right Arm)   Pulse 67   Temp 98.9 F (37.2 C) (Oral)   Resp 16   SpO2 97%   Visual Acuity Right Eye Distance:   Left Eye Distance:   Bilateral Distance:    Right Eye Near:   Left Eye Near:    Bilateral Near:     Physical Exam Vitals and nursing note reviewed.  Constitutional:  Appearance: He is not ill-appearing or toxic-appearing.  HENT:     Head: Normocephalic and atraumatic.     Right Ear: Hearing and external ear normal.     Left Ear: Hearing and external ear normal.     Nose: Nose normal.     Mouth/Throat:     Lips: Pink.     Mouth: Mucous membranes are moist. No injury.     Tongue: No lesions. Tongue does not deviate from midline.     Palate: No mass and lesions.     Pharynx:  Oropharynx is clear. Uvula midline. No pharyngeal swelling, oropharyngeal exudate, posterior oropharyngeal erythema or uvula swelling.     Tonsils: No tonsillar exudate or tonsillar abscesses.  Eyes:     General: Lids are normal. Vision grossly intact. Gaze aligned appropriately.     Extraocular Movements: Extraocular movements intact.     Conjunctiva/sclera: Conjunctivae normal.  Cardiovascular:     Rate and Rhythm: Normal rate and regular rhythm.     Heart sounds: Normal heart sounds, S1 normal and S2 normal.  Pulmonary:     Effort: Pulmonary effort is normal. No respiratory distress.     Breath sounds: Normal breath sounds and air entry.  Musculoskeletal:     Cervical back: Neck supple.     Right lower leg: Edema (+1 pitting edema) present.     Left lower leg: Edema (+2 pitting edema surrounding wound) present.     Comments: Homans signs negative bilaterally.   Skin:    General: Skin is warm and dry.     Capillary Refill: Capillary refill takes less than 2 seconds.     Findings: Wound present. No rash.          Comments: Non-pressure wound with purulent/serous drainage and surrounding erythema, tenderness, and swelling to the medial aspect of the left lower leg. +2 dorsalis pedis pulses bilaterally. Less than 2 cap refill distally. Ambulatory with steady gait with cane. Venous stasis skin changes to baseline.   Neurological:     General: No focal deficit present.     Mental Status: He is alert and oriented to person, place, and time. Mental status is at baseline.     Cranial Nerves: No dysarthria or facial asymmetry.  Psychiatric:        Mood and Affect: Mood normal.        Speech: Speech normal.        Behavior: Behavior normal.        Thought Content: Thought content normal.        Judgment: Judgment normal.   Left medial lower leg   Left lower leg    UC Treatments / Results  Labs (all labs ordered are listed, but only abnormal results are displayed) Labs Reviewed -  No data to display  EKG   Radiology No results found.  Procedures Procedures (including critical care time)  Medications Ordered in UC Medications - No data to display  Initial Impression / Assessment and Plan / UC Course  I have reviewed the triage vital signs and the nursing notes.  Pertinent labs & imaging results that were available during my care of the patient were reviewed by me and considered in my medical decision making (see chart for details).   1. Non-pressure chronic ulcer of left ankle with fat layer exposed, cellulitis of left leg, venous stasis dermatitis Patient with chronic wound and recurrent cellulitis infection.  History of peripheral vascular disease complicating wound healing.  Exam shows good distal  perfusion.  Bactrim DS every 12 hours for 7 days to treat cellulitis.  Topical mupirocin every 12 hours for 7 days.  Wound care and infection return precautions discussed. Patient given information to call wound care center to schedule appointment for ongoing management of chronic wound. PCP follow-up encouraged as well.   Counseled patient on potential for adverse effects with medications prescribed/recommended today, strict ER and return-to-clinic precautions discussed, patient verbalized understanding.    Final Clinical Impressions(s) / UC Diagnoses   Final diagnoses:  Non-pressure chronic ulcer of left ankle with fat layer exposed (HCC)  Cellulitis of leg, left  Venous stasis dermatitis     Discharge Instructions      Take bactrim antibiotic twice a day for 7 days to treat infected wound. Apply mupirocin ointment twice a day for 7 days. Call wound care center to schedule an appointment for follow-up and ongoing management of chronic wound.  If your wound worsens or becomes more painful, red, swollen, or with more white drainage, please seek medical care. If you start having fever, chills, nausea, vomiting, etc, please go to the ER.     ED  Prescriptions     Medication Sig Dispense Auth. Provider   sulfamethoxazole-trimethoprim (BACTRIM DS) 800-160 MG tablet Take 1 tablet by mouth 2 (two) times daily for 7 days. 14 tablet Carlisle Beers, FNP      PDMP not reviewed this encounter.   Carlisle Beers, FNP 08/13/23 2110    Carlisle Beers, FNP 08/13/23 2111

## 2023-08-18 ENCOUNTER — Encounter: Payer: Self-pay | Admitting: Registered Nurse

## 2023-08-18 ENCOUNTER — Encounter: Payer: Medicare PPO | Attending: Registered Nurse | Admitting: Registered Nurse

## 2023-08-18 VITALS — BP 128/67 | HR 60 | Ht 75.0 in | Wt 235.0 lb

## 2023-08-18 DIAGNOSIS — Z5181 Encounter for therapeutic drug level monitoring: Secondary | ICD-10-CM | POA: Diagnosis not present

## 2023-08-18 DIAGNOSIS — G894 Chronic pain syndrome: Secondary | ICD-10-CM | POA: Diagnosis not present

## 2023-08-18 DIAGNOSIS — M25511 Pain in right shoulder: Secondary | ICD-10-CM

## 2023-08-18 DIAGNOSIS — Z79891 Long term (current) use of opiate analgesic: Secondary | ICD-10-CM | POA: Diagnosis not present

## 2023-08-18 DIAGNOSIS — G8929 Other chronic pain: Secondary | ICD-10-CM | POA: Diagnosis not present

## 2023-08-18 DIAGNOSIS — M47817 Spondylosis without myelopathy or radiculopathy, lumbosacral region: Secondary | ICD-10-CM

## 2023-08-18 DIAGNOSIS — M25512 Pain in left shoulder: Secondary | ICD-10-CM | POA: Diagnosis not present

## 2023-08-18 DIAGNOSIS — M546 Pain in thoracic spine: Secondary | ICD-10-CM | POA: Diagnosis not present

## 2023-08-18 DIAGNOSIS — S8410XS Injury of peroneal nerve at lower leg level, unspecified leg, sequela: Secondary | ICD-10-CM | POA: Diagnosis not present

## 2023-08-18 DIAGNOSIS — M25562 Pain in left knee: Secondary | ICD-10-CM | POA: Diagnosis present

## 2023-08-18 DIAGNOSIS — G629 Polyneuropathy, unspecified: Secondary | ICD-10-CM

## 2023-08-18 DIAGNOSIS — M25561 Pain in right knee: Secondary | ICD-10-CM

## 2023-08-18 DIAGNOSIS — M25551 Pain in right hip: Secondary | ICD-10-CM | POA: Diagnosis present

## 2023-08-18 DIAGNOSIS — M542 Cervicalgia: Secondary | ICD-10-CM

## 2023-08-18 MED ORDER — TRAMADOL HCL 50 MG PO TABS: ORAL_TABLET | ORAL | 5 refills | Status: DC
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
## 2023-08-21 ENCOUNTER — Encounter: Payer: Self-pay | Admitting: Registered Nurse

## 2023-08-21 LAB — TOXASSURE SELECT,+ANTIDEPR,UR

## 2023-08-24 ENCOUNTER — Ambulatory Visit: Payer: Medicare PPO | Admitting: Emergency Medicine

## 2023-08-24 ENCOUNTER — Encounter: Payer: Self-pay | Admitting: Emergency Medicine

## 2023-08-24 VITALS — BP 118/78 | HR 62 | Temp 98.3°F | Ht 75.0 in | Wt 235.2 lb

## 2023-08-24 DIAGNOSIS — G894 Chronic pain syndrome: Secondary | ICD-10-CM | POA: Diagnosis not present

## 2023-08-24 DIAGNOSIS — T148XXA Other injury of unspecified body region, initial encounter: Secondary | ICD-10-CM | POA: Insufficient documentation

## 2023-08-24 DIAGNOSIS — Z7689 Persons encountering health services in other specified circumstances: Secondary | ICD-10-CM

## 2023-08-24 DIAGNOSIS — G629 Polyneuropathy, unspecified: Secondary | ICD-10-CM | POA: Insufficient documentation

## 2023-08-24 NOTE — Patient Instructions (Signed)
Health Maintenance After Age 72 After age 72, you are at a higher risk for certain long-term diseases and infections as well as injuries from falls. Falls are a major cause of broken bones and head injuries in people who are older than age 72. Getting regular preventive care can help to keep you healthy and well. Preventive care includes getting regular testing and making lifestyle changes as recommended by your health care provider. Talk with your health care provider about: Which screenings and tests you should have. A screening is a test that checks for a disease when you have no symptoms. A diet and exercise plan that is right for you. What should I know about screenings and tests to prevent falls? Screening and testing are the best ways to find a health problem early. Early diagnosis and treatment give you the best chance of managing medical conditions that are common after age 72. Certain conditions and lifestyle choices may make you more likely to have a fall. Your health care provider may recommend: Regular vision checks. Poor vision and conditions such as cataracts can make you more likely to have a fall. If you wear glasses, make sure to get your prescription updated if your vision changes. Medicine review. Work with your health care provider to regularly review all of the medicines you are taking, including over-the-counter medicines. Ask your health care provider about any side effects that may make you more likely to have a fall. Tell your health care provider if any medicines that you take make you feel dizzy or sleepy. Strength and balance checks. Your health care provider may recommend certain tests to check your strength and balance while standing, walking, or changing positions. Foot health exam. Foot pain and numbness, as well as not wearing proper footwear, can make you more likely to have a fall. Screenings, including: Osteoporosis screening. Osteoporosis is a condition that causes  the bones to get weaker and break more easily. Blood pressure screening. Blood pressure changes and medicines to control blood pressure can make you feel dizzy. Depression screening. You may be more likely to have a fall if you have a fear of falling, feel depressed, or feel unable to do activities that you used to do. Alcohol use screening. Using too much alcohol can affect your balance and may make you more likely to have a fall. Follow these instructions at home: Lifestyle Do not drink alcohol if: Your health care provider tells you not to drink. If you drink alcohol: Limit how much you have to: 0-1 drink a day for women. 0-2 drinks a day for men. Know how much alcohol is in your drink. In the [place] one drink equals one 12 oz bottle of beer (355 mL), one 5 oz glass of wine (148 mL), or one 1 oz glass of hard liquor (44 mL). Do not use any products that contain nicotine or tobacco. These products include cigarettes, chewing tobacco, and vaping devices, such as e-cigarettes. If you need help quitting, ask your health care provider. Activity  Follow a regular exercise program to stay fit. This will help you maintain your balance. Ask your health care provider what types of exercise are appropriate for you. If you need a cane or walker, use it as recommended by your health care provider. Wear supportive shoes that have nonskid soles. Safety  Remove any tripping hazards, such as rugs, cords, and clutter. Install safety equipment such as grab bars in bathrooms and safety rails on stairs. Keep rooms and walkways   well-lit. General instructions Talk with your health care provider about your risks for falling. Tell your health care provider if: You fall. Be sure to tell your health care provider about all falls, even ones that seem minor. You feel dizzy, tiredness (fatigue), or off-balance. Take over-the-counter and prescription medicines only as told by your health care provider. These include  supplements. Eat a healthy diet and maintain a healthy weight. A healthy diet includes low-fat dairy products, low-fat (lean) meats, and fiber from whole grains, beans, and lots of fruits and vegetables. Stay current with your vaccines. Schedule regular health, dental, and eye exams. Summary Having a healthy lifestyle and getting preventive care can help to protect your health and wellness after age 72. Screening and testing are the best way to find a health problem early and help you avoid having a fall. Early diagnosis and treatment give you the best chance for managing medical conditions that are more common for people who are older than age 72. Falls are a major cause of broken bones and head injuries in people who are older than age 72. Take precautions to prevent a fall at home. Work with your health care provider to learn what changes you can make to improve your health and wellness and to prevent falls. This information is not intended to replace advice given to you by your health care provider. Make sure you discuss any questions you have with your health care provider. Document Revised: 03/04/2021 Document Reviewed: 03/04/2021 Elsevier Patient Education  2024 Elsevier Inc.  

## 2023-08-24 NOTE — Assessment & Plan Note (Signed)
Was contacted by wound care center. Needs to call back and schedule appointment

## 2023-08-24 NOTE — Assessment & Plan Note (Signed)
On multiple chronic pain medications handled by pain management clinic

## 2023-08-24 NOTE — Assessment & Plan Note (Signed)
Follows up with neurologist on a regular basis Getting physical therapy Contributing to chronic pain On multiple medications handled by pain management clinic
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
## 2023-08-30 ENCOUNTER — Other Ambulatory Visit: Payer: Self-pay

## 2023-08-30 ENCOUNTER — Emergency Department (HOSPITAL_COMMUNITY): Payer: Medicare PPO

## 2023-08-30 ENCOUNTER — Encounter (HOSPITAL_COMMUNITY): Payer: Self-pay

## 2023-08-30 ENCOUNTER — Emergency Department (HOSPITAL_COMMUNITY)
Admission: EM | Admit: 2023-08-30 | Discharge: 2023-08-30 | Disposition: A | Discharge status: Home / Self Care | Payer: Medicare PPO | Attending: Emergency Medicine | Admitting: Emergency Medicine

## 2023-08-30 DIAGNOSIS — T8149XA Infection following a procedure, other surgical site, initial encounter: Secondary | ICD-10-CM | POA: Insufficient documentation

## 2023-08-30 DIAGNOSIS — R2242 Localized swelling, mass and lump, left lower limb: Secondary | ICD-10-CM | POA: Diagnosis not present

## 2023-08-30 DIAGNOSIS — L089 Local infection of the skin and subcutaneous tissue, unspecified: Secondary | ICD-10-CM | POA: Diagnosis not present

## 2023-08-30 DIAGNOSIS — L03116 Cellulitis of left lower limb: Secondary | ICD-10-CM | POA: Diagnosis not present

## 2023-08-30 DIAGNOSIS — S91002A Unspecified open wound, left ankle, initial encounter: Secondary | ICD-10-CM | POA: Diagnosis not present

## 2023-08-30 LAB — CBC WITH DIFFERENTIAL/PLATELET
Abs Immature Granulocytes: 0.01 10*3/uL (ref 0.00–0.07)
Basophils Absolute: 0 10*3/uL (ref 0.0–0.1)
Basophils Relative: 1 %
Eosinophils Absolute: 0.1 10*3/uL (ref 0.0–0.5)
Eosinophils Relative: 1 %
HCT: 41.1 % (ref 39.0–52.0)
Hemoglobin: 13.9 g/dL (ref 13.0–17.0)
Immature Granulocytes: 0 %
Lymphocytes Relative: 19 %
Lymphs Abs: 1.2 10*3/uL (ref 0.7–4.0)
MCH: 33.3 pg (ref 26.0–34.0)
MCHC: 33.8 g/dL (ref 30.0–36.0)
MCV: 98.6 fL (ref 80.0–100.0)
Monocytes Absolute: 0.4 10*3/uL (ref 0.1–1.0)
Monocytes Relative: 6 %
Neutro Abs: 4.9 10*3/uL (ref 1.7–7.7)
Neutrophils Relative %: 73 %
Platelets: 132 10*3/uL — ABNORMAL LOW (ref 150–400)
RBC: 4.17 MIL/uL — ABNORMAL LOW (ref 4.22–5.81)
RDW: 12.6 % (ref 11.5–15.5)
WBC: 6.7 10*3/uL (ref 4.0–10.5)
nRBC: 0 % (ref 0.0–0.2)

## 2023-08-30 LAB — BASIC METABOLIC PANEL
Anion gap: 8 (ref 5–15)
BUN: 28 mg/dL — ABNORMAL HIGH (ref 8–23)
CO2: 23 mmol/L (ref 22–32)
Calcium: 9.1 mg/dL (ref 8.9–10.3)
Chloride: 108 mmol/L (ref 98–111)
Creatinine, Ser: 1.12 mg/dL (ref 0.61–1.24)
GFR, Estimated: >60 mL/min (ref >60)
Glucose, Bld: 107 mg/dL — ABNORMAL HIGH (ref 70–99)
Potassium: 4.1 mmol/L (ref 3.5–5.1)
Sodium: 139 mmol/L (ref 135–145)

## 2023-08-30 LAB — I-STAT CG4 LACTIC ACID, ED: Lactic Acid, Venous: 1.1 mmol/L (ref 0.5–1.9)

## 2023-08-30 MED ORDER — SULFAMETHOXAZOLE-TRIMETHOPRIM 800-160 MG PO TABS: 1.0000 | ORAL_TABLET | Freq: Two times a day (BID) | ORAL | 0 refills | Status: AC

## 2023-08-30 MED ORDER — SULFAMETHOXAZOLE-TRIMETHOPRIM 800-160 MG PO TABS
1.0000 | ORAL_TABLET | Freq: Once | ORAL | Status: AC
  Administered 2023-08-30: 1 via ORAL
  Filled 2023-08-30: qty 1

## 2023-08-30 NOTE — Discharge Instructions (Signed)
You were seen in the emergency department for concern of wound infection.  It does appear that your wound is starting to become infected again, your x-ray showed no signs of bone infection and your labs showed no signs of severe infection or sepsis.  I have given you a new prescription of antibiotics to complete this as prescribed.  You can continue to use the mupirocin ointment that you have and apply dressings.  You should follow-up with your primary doctor and wound care to have your wound rechecked.  You should return to the emergency department if you are having streaking redness of your leg despite the antibiotics, fevers despite the antibiotics or any other new or concerning symptoms.

## 2023-08-30 NOTE — ED Triage Notes (Signed)
Pt presents with c/o  left leg swelling and wound infection. Was treated and completed antibiotic. Getting worse. Swollen and red. Denies fever, chills. Pedal pulse palpable.

## 2023-08-30 NOTE — ED Provider Notes (Signed)
[facility] EMERGENCY DEPARTMENT AT [facility] Provider Note   CSN: [id number] Arrival date & time: 08/30/23  1525     History  Chief Complaint  Patient presents with   Wound Infection    Kristopher Gay is a 72 y.o. male.  Patient is a 72 year old male with a chronic wound to his left ankle presenting to the emergency department with concern for wound infection.  The patient states that he first developed this wound and August and developed cellulitis.  He states that it resolved with antibiotics and the wound was improving.  He states that in October the wound started to increase in size and became red and hot.  He was given antibiotics and the redness improved.  He states over the last 2 to 3 days the redness has started to increase again and has had increasing crusting around the wound.  He states that he was referred to wound care by his primary doctor but has not yet been able to schedule an appointment with them.  He denies any fevers.  The history is provided by the patient.       Home Medications Prior to Admission medications   Medication Sig Start Date End Date Taking? Authorizing Provider  sulfamethoxazole-trimethoprim (BACTRIM DS) 800-160 MG tablet Take 1 tablet by mouth 2 (two) times daily for 7 days. 08/30/23 09/06/23 Yes Elayne Snare K, DO  acetaminophen (TYLENOL) 500 MG tablet Take 2 tablets (1,000 mg total) by mouth every 6 (six) hours as needed for mild pain or moderate pain. 03/01/21   Jenne Pane, PA-C  ascorbic acid (VITAMIN C) 1000 MG tablet Take 2,000 mg by mouth daily.    [provider]  atorvastatin (LIPITOR) 40 MG tablet TAKE 1 TABLET(40 MG) BY MOUTH DAILY 05/18/23   Carlos Levering, NP  Azelastine-Fluticasone 137-50 MCG/ACT SUSP 1-2 puffs each nostril twice daily as needed 12/22/22   Jetty Duhamel D, MD  Calcium Carbonate-Vit D-Min (CALCIUM 1200 PO) Take 1 tablet by mouth in the morning and at bedtime.    [provider]   celecoxib (CELEBREX) 100 MG capsule TAKE 1 CAPSULE BY MOUTH TWICE DAILY 07/07/23   Jones Bales, NP  Cholecalciferol (VITAMIN D3) 50 MCG (2000 UT) capsule Take 2,000 Units by mouth in the morning and at bedtime.    [provider]  cyanocobalamin 1000 MCG tablet Take 1,000 mcg by mouth in the morning and at bedtime.    [provider]  DHEA 25 MG CAPS Take 25 mg by mouth daily.    [provider]  docusate sodium (DOK) 100 MG capsule     [provider]  EPINEPHrine 0.3 mg/0.3 mL IJ SOAJ injection Inject 0.3 mg into the muscle as needed for anaphylaxis.    [provider]  fluticasone-salmeterol [facility] INHUB) 500-50 MCG/ACT AEPB     [provider]  Fluticasone-Umeclidin-Vilant (TRELEGY ELLIPTA) 100-62.5-25 MCG/ACT AEPB INHALE 1 PUFF INTO THE LUNGS DAILY 08/05/23   Jetty Duhamel D, MD  furosemide (LASIX) 20 MG tablet TAKE 1 TABLET(20 MG) BY MOUTH DAILY 02/12/23   Ronnald Nian, MD  gabapentin (NEURONTIN) 300 MG capsule TAKE 2 CAPSULE BY MOUTH EVERY MORNING AND 1 CAPSULE MID DAY AND 2 CAPSULE AT [facility] 07/21/23   Jones Bales, NP  Glucosamine HCl-MSM (GLUCOSAMINE-MSM PO) Take 1 tablet by mouth in the morning and at bedtime.    [provider]  ibuprofen (ADVIL) 800 MG tablet     [provider]  Magnesium 250 MG TABS Take 250 mg by mouth daily.    [provider]  metoprolol succinate (TOPROL-XL) 25 MG 24 hr tablet TAKE 1 TABLET(25 MG) BY MOUTH DAILY 07/06/23   [name], Lisette Abu, MD  montelukast (SINGULAIR) 10 MG tablet TAKE 1 TABLET(10 MG) BY MOUTH DAILY 02/12/23   Ronnald Nian, MD  Multiple Vitamin (MULTIVITAMIN) tablet Take 1 tablet by mouth 2 (two) times daily.     [provider]  nitroGLYCERIN (NITROSTAT) 0.4 MG SL tablet Place 0.4 mg under the tongue every 5 (five) minutes as needed for chest pain.    [provider]  Omega-3 Fatty Acids (FISH OIL) 1200 MG CAPS Take 1,200 mg by mouth  in the morning and at bedtime.    [provider]  Polyethylene Glycol 3350 (MIRALAX PO)     [provider]  traMADol (ULTRAM) 50 MG tablet TAKE 2 TABLETS BY MOUTH EVERY 6 HOURS AS NEEDED FOR PAIN 08/18/23   Jones Bales, NP  vitamin E 180 MG (400 UNITS) capsule Take 400 Units by mouth in the morning and at bedtime.    [provider]      Allergies    Bee venom, Linaclotide, Molds & smuts, and Seasonal ic [cholestatin]    Review of Systems   Review of Systems  Physical Exam Updated Vital Signs BP (!) 140/72 (BP Location: Left Arm)   Pulse (!) 57   Temp 98.2 F (36.8 C) (Oral)   Resp 20   Ht 6\' 3"  (1.905 m)   Wt 106 kg   SpO2 94%   BMI 29.21 kg/m  Physical Exam Vitals and nursing note reviewed.  Constitutional:      General: He is not in acute distress.    Appearance: Normal appearance.  HENT:     Head: Normocephalic and atraumatic.     Nose: Nose normal.     Mouth/Throat:     Mouth: Mucous membranes are moist.  Eyes:     Extraocular Movements: Extraocular movements intact.     Conjunctiva/sclera: Conjunctivae normal.  Cardiovascular:     Rate and Rhythm: Normal rate.     Pulses: Normal pulses.  Pulmonary:     Effort: Pulmonary effort is normal.  Abdominal:     General: Abdomen is flat.  Musculoskeletal:        General: No tenderness. Normal range of motion.     Cervical back: Normal range of motion.     Left lower leg: Edema (1+) present.  Skin:    General: Skin is warm and dry.     Comments: ~2x2 cm crusting wound on L medial ankle with surrounding chronic skin changes, mild erythema and warmth, serous drainage, no palpable fluctuance   Neurological:     General: No focal deficit present.     Mental Status: He is alert and oriented to person, place, and time.  Psychiatric:        Mood and Affect: Mood normal.        Behavior: Behavior normal.     ED Results / Procedures / Treatments   Labs (all labs ordered are listed,  but only abnormal results are displayed) Labs Reviewed  CBC WITH DIFFERENTIAL/PLATELET - Abnormal; Notable for the following components:      Result Value   RBC 4.17 (*)    Platelets 132 (*)    All other components within normal limits  BASIC METABOLIC PANEL - Abnormal; Notable for the following components:   Glucose,  Bld 107 (*)    BUN 28 (*)    All other components within normal limits  I-STAT CG4 LACTIC ACID, ED    EKG None  Radiology DG Ankle Complete Left  Result Date: 08/30/2023 CLINICAL DATA:  Medial malleolus wound. EXAM: LEFT ANKLE COMPLETE - 3 VIEW COMPARISON:  None Available. FINDINGS: There is no evidence of fracture, dislocation, or joint effusion. There is no evidence of arthropathy or other focal bone abnormality. Soft tissues are unremarkable. IMPRESSION: No acute osseous abnormality. Electronically Signed   By: Karen Kays M.D.   On: 08/30/2023 16:51    Procedures Procedures    Medications Ordered in ED Medications  sulfamethoxazole-trimethoprim (BACTRIM DS) 800-160 MG per tablet 1 tablet (1 tablet Oral Given 08/30/23 1817)    ED Course/ Medical Decision Making/ A&P                                 Medical Decision Making This patient presents to the ED with chief complaint(s) of wound infection with pertinent past medical history of chronic L ankle wound which further complicates the presenting complaint. The complaint involves an extensive differential diagnosis and also carries with it a high risk of complications and morbidity.    The differential diagnosis includes cellulitis, chronic wound, osteomyelitis, hemodynamically stable making sepsis unlikely   Additional history obtained: Additional history obtained from N/A Records reviewed Primary Care Documents and urgent care records  ED Course and Reassessment: On patient's arrival he is hemodynamically stable in no acute distress.  Was initially evaluated by triage and had labs and ankle x-ray  performed.  Labs are within normal range without signs of sepsis, x-ray shows no signs of osteomyelitis or bony abnormality.  Patient does appear to have a chronic wound though looking at pictures from his PCP appointment on 10/28, does appear more erythematous today and is warm to touch.  He has been off his antibiotics for 10 days and had initial improvement so unlikely to be failure of outpatient antibiotics.  He will be given a new prescription of oral antibiotics and was recommended close wound care follow-up.  He was given strict return precautions.  Independent labs interpretation:  The following labs were independently interpreted: Within normal range  Independent visualization of imaging: - I independently visualized the following imaging with scope of interpretation limited to determining acute life threatening conditions related to emergency care: Left ankle x-ray, which revealed no acute disease  Consultation: - Consulted or discussed management/test interpretation w/ external professional: N/A  Consideration for admission or further workup: Patient has no emergent conditions requiring admission or further work-up at this time and is stable for discharge home with primary care follow-up  Social Determinants of health: N/A    Risk Prescription drug management.          Final Clinical Impression(s) / ED Diagnoses Final diagnoses:  Wound infection    Rx / DC Orders ED Discharge Orders          Ordered    sulfamethoxazole-trimethoprim (BACTRIM DS) 800-160 MG tablet  2 times daily        08/30/23 1823              Rexford Maus, DO 08/30/23 1824

## 2023-08-30 NOTE — ED Provider Triage Note (Signed)
Emergency Medicine Provider Triage Evaluation Note  Kristopher Gay , a 72 y.o. male  was evaluated in triage.  Pt complains of wound to left medial malleolus.  Has been ongoing issue for patient.  Is been seen by urgent care per patient multiple times and given antibiotics.  He tried calling wound care for an appointment however has not heard back from them.  Wound is red.  No drainage.  He has chronic neuropathy in his bilateral feet.  No fever  Review of Systems  Positive: Left ankle wound Negative:   Physical Exam  BP (!) 140/72 (BP Location: Left Arm)   Pulse (!) 57   Temp 98.2 F (36.8 C) (Oral)   Resp 20   Ht 6\' 3"  (1.905 m)   Wt 106 kg   SpO2 94%   BMI 29.21 kg/m  Gen:   Awake, no distress   Resp:  Normal effort  MSK:   Moves extremities without difficulty, wound to left medial malleolus, crusting with surrounding erythema.  Prior surgical scars Other:    Medical Decision Making  Medically screening exam initiated at 4:06 PM.  Appropriate orders placed.  Kristopher Gay was informed that the remainder of the evaluation will be completed by another provider, this initial triage assessment does not replace that evaluation, and the importance of remaining in the ED until their evaluation is complete.  Left ankle wound   Kristopher Gay A, PA-C 08/30/23 1608

## 2023-08-31 ENCOUNTER — Telehealth: Payer: Self-pay | Admitting: Emergency Medicine

## 2023-08-31 DIAGNOSIS — T148XXA Other injury of unspecified body region, initial encounter: Secondary | ICD-10-CM

## 2023-08-31 NOTE — Telephone Encounter (Signed)
Patient called and said that this has to be sent today to the one in [facility].

## 2023-08-31 NOTE — Telephone Encounter (Signed)
Patient called and said he needs a referral to the wound care center in [facility] in [place]  [phone]  [address], [place]  Patient would like a call back when referral has been done

## 2023-08-31 NOTE — Telephone Encounter (Signed)
Called patient to inform him that a referral is placed to wound care for him

## 2023-08-31 NOTE — Telephone Encounter (Signed)
Patient called back and said this is serious and needs to be taken care of today

## 2023-09-01 NOTE — Telephone Encounter (Signed)
The referral was placed yesterday to the wound care clinic in [place]

## 2023-09-08 DIAGNOSIS — I872 Venous insufficiency (chronic) (peripheral): Secondary | ICD-10-CM | POA: Diagnosis not present

## 2023-09-08 DIAGNOSIS — R600 Localized edema: Secondary | ICD-10-CM | POA: Diagnosis not present

## 2023-09-08 DIAGNOSIS — L97322 Non-pressure chronic ulcer of left ankle with fat layer exposed: Secondary | ICD-10-CM | POA: Diagnosis not present

## 2023-09-11 ENCOUNTER — Telehealth: Payer: Self-pay

## 2023-09-11 NOTE — Telephone Encounter (Signed)
Transition Care Management Unsuccessful Follow-up Telephone Call  Date of discharge and from where:  08/30/2023 [facility]  Attempts:  1st Attempt  Reason for unsuccessful TCM follow-up call:  Left voice message  Negin Hegg [facility]  [facility], [facility] Resource Care Guide Direct Dial: [phone]  Website: [email or web address]

## 2023-09-14 ENCOUNTER — Telehealth: Payer: Self-pay

## 2023-09-14 NOTE — Telephone Encounter (Signed)
Transition Care Management Follow-up Telephone Call Date of discharge and from where: 08/30/2023 [facility] How have you been since you were released from the hospital? Patient stated he is still not feeling any better. Any questions or concerns? No  Items Reviewed: Did the pt receive and understand the discharge instructions provided? Yes  Medications obtained and verified? Yes  Other? No  Any new allergies since your discharge? No  Dietary orders reviewed? Yes Do you have support at home? Yes   Follow up appointments reviewed:  PCP Hospital f/u appt confirmed?  Patient contacted for referral to wound care  Scheduled to see  on  @ . Specialist Hospital f/u appt confirmed? Yes  Scheduled to see Daryl Eastern. Dillard, MD on 09/15/2023 @ [facility]. Are transportation arrangements needed? No  If their condition worsens, is the pt aware to call PCP or go to the Emergency Dept.? Yes Was the patient provided with contact information for the PCP's office or ED? Yes Was to pt encouraged to call back with questions or concerns? Yes   Faiza Bansal [facility]  [facility], [facility] Guide Direct Dial: [phone]  Website: [email or web address]

## 2023-09-15 DIAGNOSIS — L97322 Non-pressure chronic ulcer of left ankle with fat layer exposed: Secondary | ICD-10-CM | POA: Diagnosis not present

## 2023-09-15 DIAGNOSIS — I872 Venous insufficiency (chronic) (peripheral): Secondary | ICD-10-CM | POA: Diagnosis not present

## 2023-09-15 DIAGNOSIS — R600 Localized edema: Secondary | ICD-10-CM | POA: Diagnosis not present

## 2023-09-17 ENCOUNTER — Other Ambulatory Visit: Payer: Self-pay | Admitting: Family Medicine

## 2023-09-17 ENCOUNTER — Other Ambulatory Visit: Payer: Self-pay | Admitting: Emergency Medicine

## 2023-09-17 DIAGNOSIS — R609 Edema, unspecified: Secondary | ICD-10-CM

## 2023-09-18 NOTE — Telephone Encounter (Signed)
Patient needs this refill - Please call patient and he was here in October, 2024

## 2023-09-21 ENCOUNTER — Other Ambulatory Visit: Payer: Self-pay | Admitting: Emergency Medicine

## 2023-09-21 ENCOUNTER — Telehealth: Payer: Self-pay | Admitting: Emergency Medicine

## 2023-09-21 DIAGNOSIS — R609 Edema, unspecified: Secondary | ICD-10-CM

## 2023-09-21 MED ORDER — FUROSEMIDE 20 MG PO TABS
ORAL_TABLET | ORAL | 1 refills | Status: DC
Start: 2023-09-21 — End: 2024-03-22

## 2023-09-21 NOTE — Addendum Note (Signed)
Addended by: Evie Lacks on: 09/21/2023 03:04 PM   Modules accepted: Orders

## 2023-09-21 NOTE — Telephone Encounter (Signed)
Patient just called and wants to know why this is being denied.  Please call patient and let him know - [phone]

## 2023-09-21 NOTE — Telephone Encounter (Signed)
Prescription Request  09/21/2023  LOV: 08/24/2023  What is the name of the medication or equipment? furosemide (LASIX) 20 MG tablet   Have you contacted your pharmacy to request a refill? No   Which pharmacy would you like this sent to?  [id number] [place], [place] - [address] AT [facility] OF [facility] [address] [place] [address] Phone: [phone] Fax: [phone]    Patient notified that their request is being sent to the clinical staff for review and that they should receive a response within 2 business days.   Please advise at Mobile [phone] (mobile)  Pt called asking if Dr. Alvy Bimler could prescribe this medication because he no longer see Dr. Sharlot Gowda. Please advise. Please update for what pt for he need to do about this situation.

## 2023-09-22 DIAGNOSIS — R600 Localized edema: Secondary | ICD-10-CM | POA: Diagnosis not present

## 2023-09-22 DIAGNOSIS — L97322 Non-pressure chronic ulcer of left ankle with fat layer exposed: Secondary | ICD-10-CM | POA: Diagnosis not present

## 2023-09-22 DIAGNOSIS — I872 Venous insufficiency (chronic) (peripheral): Secondary | ICD-10-CM | POA: Diagnosis not present

## 2023-09-29 DIAGNOSIS — L97322 Non-pressure chronic ulcer of left ankle with fat layer exposed: Secondary | ICD-10-CM | POA: Diagnosis not present

## 2023-09-29 DIAGNOSIS — I872 Venous insufficiency (chronic) (peripheral): Secondary | ICD-10-CM | POA: Diagnosis not present

## 2023-10-06 DIAGNOSIS — L97322 Non-pressure chronic ulcer of left ankle with fat layer exposed: Secondary | ICD-10-CM | POA: Diagnosis not present

## 2023-10-06 DIAGNOSIS — I872 Venous insufficiency (chronic) (peripheral): Secondary | ICD-10-CM | POA: Diagnosis not present

## 2023-10-08 ENCOUNTER — Encounter (HOSPITAL_BASED_OUTPATIENT_CLINIC_OR_DEPARTMENT_OTHER): Payer: Medicare PPO | Admitting: Internal Medicine

## 2023-12-23 NOTE — Progress Notes (Signed)
 Tear---------------  Patient ID: Kristopher Gay, male    DOB: 1951/09/08, 73 y.o.   MRN: [id number]  HPI  M never smoker followed for allergic rhinitis and allergic asthma/ bronchitis, complicated by chronic musculoskeletal pain after an injury, GERD/ reflux, AFib Barium Swallow 04/04/14.2 Moderate gastroesophageal reflux to the level of mid esophagus Allergy Profile 10/16/16- Neg-no elevation for local environmental allergens CT chest 01/11/2016-IMPRESSION: 1. 2.2 cm left adrenal adenoma.ower lobe  2. New bronchial wall thickening and nodular opacities in the right likely reflecting bronchiolitis Office Spirometry 09/23/2016-mild restriction of exhaled volume. FVC 3.75/71%, FEV1 3.01/76%, ratio 0.80, FEF 25-75% 2.96/95%. -------------------------------------------------------------------------------------------------------------------   12/22/22-73 year old male never smoker followed for Allergic Rhinitis, Allergic Asthma/Bronchitis, complicated by chronic musculoskeletal pain after injury, GERD/reflux, grade 1 diastolic dysfunction, degenerative disc disease, PAFib/ Eliquis, Cellulitis Left leg, Leg Brace for hx Quadriceps tear, HTN, Dependent Edema,  -  Trelegy 100, flonase, singulair, Dymista nasal spray, [facility]- now off. Covid vax-3 Phizer Flu vax-had Was in rehab facility after repair ruptured quadriceps tendon. -----Trelegy is helping.  May have had COVID infection during the winter-resolved now.  Was in nursing facility after quadriceps surgery. Finds Trelegy helpful.  Asthma control comfortable.  Needs refills on Trelegy and Dymista nasal spray.  Not yet having significant seasonal problems with rhinitis.  Feels little difference after quitting allergy vaccine so he will stay off of that. Discussed RSV vaccine. CXR 12/23/21- No active cardiopulmonary disease.  12/24/23- 73 year old male never smoker followed for Allergic Rhinitis, Allergic  Asthma/Bronchitis, complicated by chronic musculoskeletal pain after injury, GERD/reflux, grade 1 diastolic dysfunction, degenerative disc disease, hx Cellulitis Left leg, Leg Brace for hx Quadriceps tear, HTN, Dependent Edema,  -  Trelegy 100, flonase, singulair, Dymista nasal spray, Being treated for stasis wound on leg. Asks Dymista refill, noting onset of early Spring pollen season. Discussed the use of AI scribe software for clinical note transcription with the patient, who gave verbal consent to proceed. History of Present Illness   The patient, with a history of asthma, presents with a recently healed leg wound and ongoing breathing issues. He reports a history of falls and bruising. The leg wound, initially an abrasion on the ankle, developed into cellulitis after being scratched by a limb. Despite initial treatment in the ER and subsequent wound care instructions from an urgent care center, the wound did not improve until he received treatment at [facility]. After about five visits, the wound healed. He was advised to wear a compression stocking to manage swelling, which was identified as a contributing factor to the wound's delayed healing.  The patient also reports breathing issues, which are not too severe but are triggered by cold weather. He is currently on Trelegy for his asthma and has been prescribed Singulair in the past. He has not needed a rescue inhaler for a long time. He also has nasal congestion and allergy problems, which flare up in the spring and fall. He has been prescribed Dymista for this in the past and would like a new prescription if his insurance will cover it.  The patient also mentions a past episode of atrial fibrillation following hernia surgery, but this is not an ongoing issue. He sees a cardiologist annually for monitoring. He is currently on metoprolol for heart rate control.     ROS-see HPI             + = positive Constitutional:   No-   weight loss,  night sweats,  fevers, chills, fatigue, lassitude. HEENT:   No-  headaches, difficulty swallowing, tooth/dental problems, +sore throat,       sneezing, itching, ear ache,  nasal congestion, +post nasal drip,  CV:  chest pain, no-orthopnea, PND, swelling in lower extremities, anasarca,  dizziness, palpitations Resp: No-   shortness of breath with exertion or at rest.             + productive cough,  + non-productive cough,  No- coughing up of blood.              No-   change in color of mucus.  +wheezing.   Skin: No-   rash or lesions. GI:  No-   heartburn, indigestion, abdominal pain, nausea, vomiting,  GU: . MS: +  joint pain or swelling.  No- decreased range of motion.  + back pain. Neuro-     nothing unusual Psych:  No- change in mood or affect. + depression or anxiety.  No memory loss.  OBJ- Physical Exam General- Alert, Oriented, Affect-appropriate, Distress- none acute. +tall Skin- rash-none, lesions- none, excoriation- none Lymphadenopathy- none Head- atraumatic            Eyes- Gross vision intact, PERRLA, conjunctivae and secretions clear            Ears- Hearing, canals-normal            Nose- Clear, no-Septal dev, mucus, polyps, erosion, perforation             Throat- Mallampati II , mucosa clear , drainage- none, tonsils- atrophic Neck- flexible , trachea midline, no stridor , thyroid nl, carotid no bruit Chest - symmetrical excursion , unlabored           Heart/CV- RRR , no murmur , no gallop  , no rub, nl s1 s2                           - JVD- none , edema- , stasis changes-, varices- none           Lung- clear, dullness-none, rub- none, cough-none           Chest wall- not obviously tender Abd-  Br/ Gen/ Rectal- Not done, not indicated Extrem- +cane Neuro- grossly intact to observation  Assessment and Plan    Lower Extremity Wound History of abrasion on ankle complicated by cellulitis. Treated with antibiotics and wound care at [facility]. Wound has healed  but patient has ongoing edema. -Continue with compression stockings as advised by wound care.  Allergic Rhinitis Patient has been using Dymista, but is aware of the cost and availability of Fluticasone (Flonase) over the counter. -Write prescription for Dymista as per patient's request. If not covered by insurance, patient will purchase Flonase over the counter.  Asthma Triggered by cold weather. Patient is on Trelegy and Singulair. No recent use of rescue inhaler (Albuterol). -Continue Trelegy daily. -Discontinue Advair as it is redundant with Trelegy. -Check refill status of Trelegy.  General Health Maintenance -Ensure up-to-date with COVID-19 and influenza vaccinations.

## 2023-12-24 ENCOUNTER — Encounter: Payer: Self-pay | Admitting: Internal Medicine

## 2023-12-24 ENCOUNTER — Ambulatory Visit: Payer: Medicare PPO | Admitting: Internal Medicine

## 2023-12-24 VITALS — BP 118/70 | HR 59 | Temp 98.0°F | Ht 75.0 in | Wt 227.0 lb

## 2023-12-24 DIAGNOSIS — J4520 Mild intermittent asthma, uncomplicated: Secondary | ICD-10-CM

## 2023-12-24 DIAGNOSIS — J45909 Unspecified asthma, uncomplicated: Secondary | ICD-10-CM | POA: Diagnosis not present

## 2023-12-24 MED ORDER — AZELASTINE-FLUTICASONE 137-50 MCG/ACT NA SUSP: NASAL | 12 refills | Status: AC

## 2023-12-24 NOTE — Patient Instructions (Signed)
 We can continue Trelegy   Script sent refilling Dymista nasal spray  Please call as needed

## 2023-12-30 ENCOUNTER — Encounter: Payer: Self-pay | Admitting: Internal Medicine

## 2024-01-10 ENCOUNTER — Other Ambulatory Visit: Payer: Self-pay | Admitting: Registered Nurse

## 2024-01-14 ENCOUNTER — Other Ambulatory Visit: Payer: Self-pay | Admitting: Registered Nurse
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
## 2024-02-05 ENCOUNTER — Encounter: Payer: Self-pay | Admitting: Registered Nurse

## 2024-02-05 ENCOUNTER — Encounter: Payer: Medicare PPO | Attending: Registered Nurse | Admitting: Registered Nurse

## 2024-02-05 VITALS — BP 137/76 | HR 60 | Temp 98.7°F | Ht 75.0 in | Wt 230.0 lb

## 2024-02-05 DIAGNOSIS — M25512 Pain in left shoulder: Secondary | ICD-10-CM | POA: Insufficient documentation

## 2024-02-05 DIAGNOSIS — Z79891 Long term (current) use of opiate analgesic: Secondary | ICD-10-CM | POA: Insufficient documentation

## 2024-02-05 DIAGNOSIS — M25511 Pain in right shoulder: Secondary | ICD-10-CM | POA: Diagnosis not present

## 2024-02-05 DIAGNOSIS — M25561 Pain in right knee: Secondary | ICD-10-CM | POA: Diagnosis not present

## 2024-02-05 DIAGNOSIS — G894 Chronic pain syndrome: Secondary | ICD-10-CM | POA: Diagnosis not present

## 2024-02-05 DIAGNOSIS — M47817 Spondylosis without myelopathy or radiculopathy, lumbosacral region: Secondary | ICD-10-CM | POA: Insufficient documentation

## 2024-02-05 DIAGNOSIS — G629 Polyneuropathy, unspecified: Secondary | ICD-10-CM | POA: Diagnosis present

## 2024-02-05 DIAGNOSIS — Z5181 Encounter for therapeutic drug level monitoring: Secondary | ICD-10-CM | POA: Diagnosis not present

## 2024-02-05 DIAGNOSIS — G8929 Other chronic pain: Secondary | ICD-10-CM | POA: Diagnosis not present

## 2024-02-05 DIAGNOSIS — M25551 Pain in right hip: Secondary | ICD-10-CM | POA: Diagnosis present

## 2024-02-05 DIAGNOSIS — M25562 Pain in left knee: Secondary | ICD-10-CM | POA: Insufficient documentation

## 2024-02-05 DIAGNOSIS — M546 Pain in thoracic spine: Secondary | ICD-10-CM | POA: Diagnosis not present

## 2024-02-05 DIAGNOSIS — S8410XS Injury of peroneal nerve at lower leg level, unspecified leg, sequela: Secondary | ICD-10-CM | POA: Diagnosis not present

## 2024-02-05 MED ORDER — TRAMADOL HCL 50 MG PO TABS: ORAL_TABLET | ORAL | 5 refills | Status: DC

## 2024-02-08 LAB — TOXASSURE SELECT,+ANTIDEPR,UR

## 2024-02-17 ENCOUNTER — Encounter

## 2024-02-17 ENCOUNTER — Ambulatory Visit

## 2024-02-23 ENCOUNTER — Encounter: Payer: Self-pay | Admitting: Emergency Medicine

## 2024-02-23 ENCOUNTER — Ambulatory Visit: Admitting: Emergency Medicine

## 2024-02-23 VITALS — BP 142/90 | HR 63 | Temp 98.4°F | Ht 75.0 in | Wt 222.8 lb

## 2024-02-23 DIAGNOSIS — K219 Gastro-esophageal reflux disease without esophagitis: Secondary | ICD-10-CM | POA: Diagnosis not present

## 2024-02-23 DIAGNOSIS — I480 Paroxysmal atrial fibrillation: Secondary | ICD-10-CM

## 2024-02-23 DIAGNOSIS — G6289 Other specified polyneuropathies: Secondary | ICD-10-CM | POA: Diagnosis not present

## 2024-02-23 DIAGNOSIS — S81802A Unspecified open wound, left lower leg, initial encounter: Secondary | ICD-10-CM | POA: Insufficient documentation

## 2024-02-23 DIAGNOSIS — G894 Chronic pain syndrome: Secondary | ICD-10-CM

## 2024-02-23 DIAGNOSIS — G629 Polyneuropathy, unspecified: Secondary | ICD-10-CM

## 2024-02-23 DIAGNOSIS — I739 Peripheral vascular disease, unspecified: Secondary | ICD-10-CM | POA: Diagnosis not present

## 2024-02-23 LAB — CBC WITH DIFFERENTIAL/PLATELET
Basophils Absolute: 0 10*3/uL (ref 0.0–0.1)
Basophils Relative: 0.6 % (ref 0.0–3.0)
Eosinophils Absolute: 0.1 10*3/uL (ref 0.0–0.7)
Eosinophils Relative: 2.8 % (ref 0.0–5.0)
HCT: 41 % (ref 39.0–52.0)
Hemoglobin: 14.2 g/dL (ref 13.0–17.0)
Lymphocytes Relative: 25.1 % (ref 12.0–46.0)
Lymphs Abs: 1.3 10*3/uL (ref 0.7–4.0)
MCHC: 34.7 g/dL (ref 30.0–36.0)
MCV: 96.8 fl (ref 78.0–100.0)
Monocytes Absolute: 0.6 10*3/uL (ref 0.1–1.0)
Monocytes Relative: 10.9 % (ref 3.0–12.0)
Neutro Abs: 3.1 10*3/uL (ref 1.4–7.7)
Neutrophils Relative %: 60.6 % (ref 43.0–77.0)
Platelets: 136 10*3/uL — ABNORMAL LOW (ref 150.0–400.0)
RBC: 4.23 Mil/uL (ref 4.22–5.81)
RDW: 13.1 % (ref 11.5–15.5)
WBC: 5.1 10*3/uL (ref 4.0–10.5)

## 2024-02-23 LAB — COMPREHENSIVE METABOLIC PANEL WITH GFR
ALT: 26 U/L (ref 0–53)
AST: 34 U/L (ref 0–37)
Albumin: 4.3 g/dL (ref 3.5–5.2)
Alkaline Phosphatase: 69 U/L (ref 39–117)
BUN: 28 mg/dL — ABNORMAL HIGH (ref 6–23)
CO2: 31 meq/L (ref 19–32)
Calcium: 9.5 mg/dL (ref 8.4–10.5)
Chloride: 105 meq/L (ref 96–112)
Creatinine, Ser: 1.05 mg/dL (ref 0.40–1.50)
GFR: 70.86 mL/min (ref >60.00)
Glucose, Bld: 106 mg/dL — ABNORMAL HIGH (ref 70–99)
Potassium: 4.3 meq/L (ref 3.5–5.1)
Sodium: 140 meq/L (ref 135–145)
Total Bilirubin: 0.6 mg/dL (ref 0.2–1.2)
Total Protein: 7.1 g/dL (ref 6.0–8.3)

## 2024-02-23 LAB — LIPID PANEL
Cholesterol: 107 mg/dL (ref 0–200)
HDL: 45.8 mg/dL (ref >39.00)
LDL Cholesterol: 45 mg/dL (ref 0–99)
NonHDL: 60.83
Total CHOL/HDL Ratio: 2
Triglycerides: 77 mg/dL (ref 0.0–149.0)
VLDL: 15.4 mg/dL (ref 0.0–40.0)

## 2024-02-23 LAB — HEMOGLOBIN A1C: Hgb A1c MFr Bld: 5.3 % (ref 4.6–6.5)

## 2024-02-23 MED ORDER — MUPIROCIN 2 % EX OINT
TOPICAL_OINTMENT | CUTANEOUS | 1 refills | Status: AC
Start: 2024-02-23 — End: ?

## 2024-02-23 NOTE — Patient Instructions (Signed)
 Health Maintenance After Age 73 After age 4, you are at a higher risk for certain long-term diseases and infections as well as injuries from falls. Falls are a major cause of broken bones and head injuries in people who are older than age 47. Getting regular preventive care can help to keep you healthy and well. Preventive care includes getting regular testing and making lifestyle changes as recommended by your health care provider. Talk with your health care provider about: Which screenings and tests you should have. A screening is a test that checks for a disease when you have no symptoms. A diet and exercise plan that is right for you. What should I know about screenings and tests to prevent falls? Screening and testing are the best ways to find a health problem early. Early diagnosis and treatment give you the best chance of managing medical conditions that are common after age 37. Certain conditions and lifestyle choices may make you more likely to have a fall. Your health care provider may recommend: Regular vision checks. Poor vision and conditions such as cataracts can make you more likely to have a fall. If you wear glasses, make sure to get your prescription updated if your vision changes. Medicine review. Work with your health care provider to regularly review all of the medicines you are taking, including over-the-counter medicines. Ask your health care provider about any side effects that may make you more likely to have a fall. Tell your health care provider if any medicines that you take make you feel dizzy or sleepy. Strength and balance checks. Your health care provider may recommend certain tests to check your strength and balance while standing, walking, or changing positions. Foot health exam. Foot pain and numbness, as well as not wearing proper footwear, can make you more likely to have a fall. Screenings, including: Osteoporosis screening. Osteoporosis is a condition that causes  the bones to get weaker and break more easily. Blood pressure screening. Blood pressure changes and medicines to control blood pressure can make you feel dizzy. Depression screening. You may be more likely to have a fall if you have a fear of falling, feel depressed, or feel unable to do activities that you used to do. Alcohol use screening. Using too much alcohol can affect your balance and may make you more likely to have a fall. Follow these instructions at home: Lifestyle Do not drink alcohol if: Your health care provider tells you not to drink. If you drink alcohol: Limit how much you have to: 0-1 drink a day for women. 0-2 drinks a day for men. Know how much alcohol is in your drink. In the [place] one drink equals one 12 oz bottle of beer (355 mL), one 5 oz glass of wine (148 mL), or one 1 oz glass of hard liquor (44 mL). Do not use any products that contain nicotine or tobacco. These products include cigarettes, chewing tobacco, and vaping devices, such as e-cigarettes. If you need help quitting, ask your health care provider. Activity  Follow a regular exercise program to stay fit. This will help you maintain your balance. Ask your health care provider what types of exercise are appropriate for you. If you need a cane or walker, use it as recommended by your health care provider. Wear supportive shoes that have nonskid soles. Safety  Remove any tripping hazards, such as rugs, cords, and clutter. Install safety equipment such as grab bars in bathrooms and safety rails on stairs. Keep rooms and walkways  well-lit. General instructions Talk with your health care provider about your risks for falling. Tell your health care provider if: You fall. Be sure to tell your health care provider about all falls, even ones that seem minor. You feel dizzy, tiredness (fatigue), or off-balance. Take over-the-counter and prescription medicines only as told by your health care provider. These include  supplements. Eat a healthy diet and maintain a healthy weight. A healthy diet includes low-fat dairy products, low-fat (lean) meats, and fiber from whole grains, beans, and lots of fruits and vegetables. Stay current with your vaccines. Schedule regular health, dental, and eye exams. Summary Having a healthy lifestyle and getting preventive care can help to protect your health and wellness after age 11. Screening and testing are the best way to find a health problem early and help you avoid having a fall. Early diagnosis and treatment give you the best chance for managing medical conditions that are more common for people who are older than age 28. Falls are a major cause of broken bones and head injuries in people who are older than age 48. Take precautions to prevent a fall at home. Work with your health care provider to learn what changes you can make to improve your health and wellness and to prevent falls. This information is not intended to replace advice given to you by your health care provider. Make sure you discuss any questions you have with your health care provider. Document Revised: 03/04/2021 Document Reviewed: 03/04/2021 Elsevier Patient Education  2024 [employer].

## 2024-02-23 NOTE — Assessment & Plan Note (Signed)
 Poor distal peripheral pulses with intermittent claudication Also history of slowly healing wounds to lower extremities left more than right Recommend evaluation by vascular surgeon Referral placed today

## 2024-02-23 NOTE — Assessment & Plan Note (Signed)
 On multiple chronic pain medications handled by pain management clinic

## 2024-02-23 NOTE — Assessment & Plan Note (Signed)
 Clinically stable.  No medications.

## 2024-02-23 NOTE — Assessment & Plan Note (Signed)
 Small scabbed wound to left lower leg Wound care management discussed Recommend to use Bactroban ointment twice a day for 7 days
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
## 2024-02-23 NOTE — Assessment & Plan Note (Signed)
 Follows up with neurologist on a regular basis Getting physical therapy Contributing to chronic pain On multiple medications handled by pain management clinic

## 2024-02-23 NOTE — Assessment & Plan Note (Signed)
 Normal sinus rhythm today Continue metoprolol  succinate 25 mg daily

## 2024-02-24 ENCOUNTER — Encounter: Payer: Self-pay | Admitting: Emergency Medicine

## 2024-02-25 ENCOUNTER — Ambulatory Visit: Payer: Medicare PPO | Admitting: Family Medicine

## 2024-03-17 ENCOUNTER — Other Ambulatory Visit: Payer: Self-pay | Admitting: Internal Medicine

## 2024-03-17 ENCOUNTER — Other Ambulatory Visit: Payer: Self-pay | Admitting: Emergency Medicine

## 2024-03-17 DIAGNOSIS — J302 Other seasonal allergic rhinitis: Secondary | ICD-10-CM

## 2024-03-17 DIAGNOSIS — R609 Edema, unspecified: Secondary | ICD-10-CM

## 2024-03-18 ENCOUNTER — Ambulatory Visit

## 2024-03-22 ENCOUNTER — Other Ambulatory Visit: Payer: Self-pay | Admitting: Radiology

## 2024-03-22 ENCOUNTER — Other Ambulatory Visit: Payer: Self-pay | Admitting: Emergency Medicine

## 2024-03-22 ENCOUNTER — Telehealth: Payer: Self-pay | Admitting: Emergency Medicine

## 2024-03-22 DIAGNOSIS — R609 Edema, unspecified: Secondary | ICD-10-CM

## 2024-03-22 MED ORDER — FUROSEMIDE 20 MG PO TABS: ORAL_TABLET | ORAL | 1 refills | Status: DC

## 2024-03-22 NOTE — Telephone Encounter (Signed)
Refill request sent

## 2024-03-22 NOTE — Telephone Encounter (Unsigned)
 Copied from CRM [id number]. Topic: Clinical - Medication Refill >> Mar 22, 2024 12:29 PM [place] wrote: Medication: furosemide  (LASIX ) 20 MG tablet  Has the patient contacted their pharmacy? Yes (Agent: If no, request that the patient contact the pharmacy for the refill. If patient does not wish to contact the pharmacy document the reason why and proceed with request.) (Agent: If yes, when and what did the pharmacy advise?)  This is the patient's preferred pharmacy:  [id number] Jonette Nestle, [place] - [address] AT [facility] OF [facility] [address] [place] [address] Phone: [phone] Fax: [phone]  Is this the correct pharmacy for this prescription? Yes If no, delete pharmacy and type the correct one.   Has the prescription been filled recently? Yes  Is the patient out of the medication? Yes  Has the patient been seen for an appointment in the last year OR does the patient have an upcoming appointment? Yes  Can we respond through MyChart? Yes  Agent: Please be advised that Rx refills may take up to 3 business days. We ask that you follow-up with your pharmacy.

## 2024-03-22 NOTE — Telephone Encounter (Signed)
 Patient called and stated that he was told the rx was denied per message and chart, the furosemide  was sent in today. Please call and advise.

## 2024-03-22 NOTE — Telephone Encounter (Signed)
 Pharmacist called in to request refill as well because they're system was down.

## 2024-03-27 ENCOUNTER — Other Ambulatory Visit: Payer: Self-pay | Admitting: Family Medicine

## 2024-03-27 DIAGNOSIS — J302 Other seasonal allergic rhinitis: Secondary | ICD-10-CM

## 2024-03-28 ENCOUNTER — Ambulatory Visit (INDEPENDENT_AMBULATORY_CARE_PROVIDER_SITE_OTHER)

## 2024-03-28 VITALS — BP 128/62 | HR 64 | Ht 75.0 in | Wt 227.6 lb

## 2024-03-28 DIAGNOSIS — Z0000 Encounter for general adult medical examination without abnormal findings: Secondary | ICD-10-CM
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
## 2024-03-28 NOTE — Patient Instructions (Signed)
 [name] , Thank you for taking time out of your busy schedule to complete your Annual Wellness Visit with me. I enjoyed our conversation and look forward to speaking with you again next year. I, as well as your care team,  appreciate your ongoing commitment to your health goals. Please review the following plan we discussed and let me know if I can assist you in the future. Your Game plan/ To Do List   Follow up Visits: Next Medicare AWV with our clinical staff: 03/30/2025   Have you seen your provider in the last 6 months (3 months if uncontrolled diabetes)? No Next Office Visit with your provider: 08/25/2024  Clinician Recommendations:  Aim for 30 minutes of exercise or brisk walking, 6-8 glasses of water, and 5 servings of fruits and vegetables each day.       This is a list of the screening recommended for you and due dates:  Health Maintenance  Topic Date Due   COVID-19 Vaccine (7 - 2024-25 season) 06/28/2023   Flu Shot  05/27/2024   Medicare Annual Wellness Visit  03/28/2025   Colon Cancer Screening  06/17/2027   DTaP/Tdap/Td vaccine (2 - Td or Tdap) 09/19/2029   Pneumonia Vaccine  Completed   Hepatitis C Screening  Completed   Zoster (Shingles) Vaccine  Completed   HPV Vaccine  Aged Out   Meningitis B Vaccine  Aged Out    Advanced directives: (Declined) Advance directive discussed with you today. Even though you declined this today, please call our office should you change your mind, and we can give you the proper paperwork for you to fill out. Advance Care Planning is important because it:  [x]  Makes sure you receive the medical care that is consistent with your values, goals, and preferences  [x]  It provides guidance to your family and loved ones and reduces their decisional burden about whether or not they are making the right decisions based on your wishes.  Follow the link provided in your after visit summary or read over the paperwork we have mailed to you to help you  started getting your Advance Directives in place. If you need assistance in completing these, please reach out to us  so that we can help you!   Managing Pain Without Opioids Opioids are strong medicines used to treat moderate to severe pain. For some people, especially those who have long-term (chronic) pain, opioids may not be the best choice for pain management due to: Side effects like nausea, constipation, and sleepiness. The risk of addiction (opioid use disorder). The longer you take opioids, the greater your risk of addiction. Pain that lasts for more than 3 months is called chronic pain. Managing chronic pain usually requires more than one approach and is often provided by a team of health care providers working together (multidisciplinary approach). Pain management may be done at a pain management center or pain clinic. How to manage pain without the use of opioids Use non-opioid medicines Non-opioid medicines for pain may include: Over-the-counter or prescription non-steroidal anti-inflammatory drugs (NSAIDs). These may be the first medicines used for pain. They work well for muscle and bone pain, and they reduce swelling. Acetaminophen . This over-the-counter medicine may work well for milder pain but not swelling. Antidepressants. These may be used to treat chronic pain. A certain type of antidepressant (tricyclics) is often used. These medicines are given in lower doses for pain than when used for depression. Anticonvulsants. These are usually used to treat seizures but may also  reduce nerve (neuropathic) pain. Muscle relaxants. These relieve pain caused by sudden muscle tightening (spasms). You may also use a pain medicine that is applied to the skin as a patch, cream, or gel (topical analgesic), such as a numbing medicine. These may cause fewer side effects than medicines taken by mouth. Do certain therapies as directed Some therapies can help with pain management. They  include: Physical therapy. You will do exercises to gain strength and flexibility. A physical therapist may teach you exercises to move and stretch parts of your body that are weak, stiff, or painful. You can learn these exercises at physical therapy visits and practice them at home. Physical therapy may also involve: Massage. Heat wraps or applying heat or cold to affected areas. Electrical signals that interrupt pain signals (transcutaneous electrical nerve stimulation, TENS). Weak lasers that reduce pain and swelling (low-level laser therapy). Signals from your body that help you learn to regulate pain (biofeedback). Occupational therapy. This helps you to learn ways to function at home and work with less pain. Recreational therapy. This involves trying new activities or hobbies, such as a physical activity or drawing. Mental health therapy, including: Cognitive behavioral therapy (CBT). This helps you learn coping skills for dealing with pain. Acceptance and commitment therapy (ACT) to change the way you think and react to pain. Relaxation therapies, including muscle relaxation exercises and mindfulness-based stress reduction. Pain management counseling. This may be individual, family, or group counseling.  Receive medical treatments Medical treatments for pain management include: Nerve block injections. These may include a pain blocker and anti-inflammatory medicines. You may have injections: Near the spine to relieve chronic back or neck pain. Into joints to relieve back or joint pain. Into nerve areas that supply a painful area to relieve body pain. Into muscles (trigger point injections) to relieve some painful muscle conditions. A medical device placed near your spine to help block pain signals and relieve nerve pain or chronic back pain (spinal cord stimulation device). Acupuncture. Follow these instructions at home Medicines Take over-the-counter and prescription medicines only  as told by your health care provider. If you are taking pain medicine, ask your health care providers about possible side effects to watch out for. Do not drive or use heavy machinery while taking prescription opioid pain medicine. Lifestyle  Do not use drugs or alcohol  to reduce pain. If you drink alcohol , limit how much you have to: 0-1 drink a day for women who are not pregnant. 0-2 drinks a day for men. Know how much alcohol  is in a drink. In the [place] one drink equals one 12 oz bottle of beer (355 mL), one 5 oz glass of wine (148 mL), or one 1 oz glass of hard liquor (44 mL). Do not use any products that contain nicotine or tobacco. These products include cigarettes, chewing tobacco, and vaping devices, such as e-cigarettes. If you need help quitting, ask your health care provider. Eat a healthy diet and maintain a healthy weight. Poor diet and excess weight may make pain worse. Eat foods that are high in fiber. These include fresh fruits and vegetables, whole grains, and beans. Limit foods that are high in fat and processed sugars, such as fried and sweet foods. Exercise regularly. Exercise lowers stress and may help relieve pain. Ask your health care provider what activities and exercises are safe for you. If your health care provider approves, join an exercise class that combines movement and stress reduction. Examples include yoga and tai chi. Get enough  sleep. Lack of sleep may make pain worse. Lower stress as much as possible. Practice stress reduction techniques as told by your therapist. General instructions Work with all your pain management providers to find the treatments that work best for you. You are an important member of your pain management team. There are many things you can do to reduce pain on your own. Consider joining an online or in-person support group for people who have chronic pain. Keep all follow-up visits. This is important. Where to find more  information You can find more information about managing pain without opioids from: American Academy of Pain Medicine: painmed.org Institute for Chronic Pain: instituteforchronicpain.org American Chronic Pain Association: theacpa.org Contact a health care provider if: You have side effects from pain medicine. Your pain gets worse or does not get better with treatments or home therapy. You are struggling with anxiety or depression. Summary Many types of pain can be managed without opioids. Chronic pain may respond better to pain management without opioids. Pain is best managed when you and a team of health care providers work together. Pain management without opioids may include non-opioid medicines, medical treatments, physical therapy, mental health therapy, and lifestyle changes. Tell your health care providers if your pain gets worse or is not being managed well enough. This information is not intended to replace advice given to you by your health care provider. Make sure you discuss any questions you have with your health care provider. Document Revised: 01/23/2021 Document Reviewed: 01/23/2021 Elsevier Patient Education  2024 [employer].

## 2024-04-08 ENCOUNTER — Other Ambulatory Visit: Payer: Self-pay | Admitting: Internal Medicine

## 2024-04-08 ENCOUNTER — Telehealth: Payer: Self-pay | Admitting: Emergency Medicine

## 2024-04-08 DIAGNOSIS — J302 Other seasonal allergic rhinitis: Secondary | ICD-10-CM

## 2024-04-08 NOTE — Telephone Encounter (Unsigned)
 Copied from CRM [id number]. Topic: Clinical - Medication Refill >> Apr 08, 2024 11:10 AM Antonieta Kitten wrote: Medication:  montelukast  (SINGULAIR ) 10 MG tablet   Has the patient contacted their pharmacy? Yes (Agent: If no, request that the patient contact the pharmacy for the refill. If patient does not wish to contact the pharmacy document the reason why and proceed with request.) (Agent: If yes, when and what did the pharmacy advise?)  This is the patient's preferred pharmacy:  [id number] Jonette Nestle, [place] - [address] AT [facility] [address] [place] [address] Phone: [phone] Fax: [phone]  Is this the correct pharmacy for this prescription? Yes If no, delete pharmacy and type the correct one.   Has the prescription been filled recently? No  Is the patient out of the medication? No  Has the patient been seen for an appointment in the last year OR does the patient have an upcoming appointment? Yes  Can we respond through MyChart? No  Agent: Please be advised that Rx refills may take up to 3 business days. We ask that you follow-up with your pharmacy.

## 2024-04-12 ENCOUNTER — Encounter: Payer: Self-pay | Admitting: Physician Assistant

## 2024-04-12 ENCOUNTER — Ambulatory Visit: Attending: Physician Assistant | Admitting: Physician Assistant

## 2024-04-12 VITALS — BP 109/67 | HR 62 | Ht 75.0 in | Wt 222.6 lb

## 2024-04-12 DIAGNOSIS — I10 Essential (primary) hypertension: Secondary | ICD-10-CM | POA: Diagnosis not present

## 2024-04-12 DIAGNOSIS — I480 Paroxysmal atrial fibrillation: Secondary | ICD-10-CM | POA: Diagnosis not present

## 2024-04-12 DIAGNOSIS — E785 Hyperlipidemia, unspecified: Secondary | ICD-10-CM | POA: Diagnosis not present

## 2024-04-12 NOTE — Patient Instructions (Signed)
 Medication Instructions:  NO CHANGES *If you need a refill on your cardiac medications before your next appointment, please call your pharmacy*  Lab Work: NO LABS If you have labs (blood work) drawn today and your tests are completely normal, you will receive your results only by: MyChart Message (if you have MyChart) OR A paper copy in the mail If you have any lab test that is abnormal or we need to change your treatment, we will call you to review the results.  Testing/Procedures: NO TESTING  Follow-Up: At [facility], you and your health needs are our priority.  As part of our continuing mission to provide you with exceptional heart care, our providers are all part of one team.  This team includes your primary Cardiologist (physician) and Advanced Practice Providers or APPs (Physician Assistants and Nurse Practitioners) who all work together to provide you with the care you need, when you need it.  Your next appointment:   1 year(s)  Provider:   Hazle Lites, MD

## 2024-04-12 NOTE — Progress Notes (Unsigned)
 Cardiology Office Note   Date:  04/14/2024  ID:  Kristopher Gay, DOB 07/26/1951, MRN [id number] PCP: Kristopher Hammersmith, MD  [facility] Providers Cardiologist:  Kristopher Lites, MD     History of Present Illness Kristopher Gay is a 73 y.o. male with a hx of COPD, postop atrial fibrillation, hypertension, hyperlipidemia and history of depression. Patient was initially diagnosed with postop A. fib in the setting of hernia repair. He was initially placed on Eliquis , later Eliquis  was discontinued given low risk and lack of recurrence. Coronary CTA in 2020 showed very minimal CAD in the LAD territory, calcium  score of 8 which placed the patient in 22nd percentile for age and sex matched control. He underwent repair of quadricep tendon in May 2022. Postprocedure, he was placed on 15 mg twice a day of Xarelto  for DVT prevention. He was readmitted to the hospital on 03/08/2021 with right knee pain. On admission, she had fever of 100.5. There was no leukocytosis. He was treated with a short course of antibiotic which was discontinued by the day of discharge. Patient was assessed by orthopedic service who has cleared him to be discharged back to the skilled nursing facility.  I last saw the patient in September 2022 at which time he was concerned about elevated blood pressure and increased fatigue.  His blood pressure was actually normal in the office.  Xarelto  was discontinued as he has completed 1 month course post orthopedic surgery.  I recommended continuing on metoprolol  succinate 25 mg daily.  He was last seen by Kristopher Ar NP on 03/27/2019 for, his blood pressure was very well-controlled at the time.  Since the last visit, patient has been seen by atrium for venous stasis ulcer of the left leg.  He was last seen by his PCP in April 2025, given poor lower extremity healing, he was referred to vascular surgery.  He has upcoming ABI and new visit with Kristopher Gay in July.  Patient presents today  for follow-up, he is in normal sinus rhythm with heart rate of 62 bpm.  He denies any recent chest pain or shortness of breath.  Her lower extremity wound has healed.  Overall, he has been doing well from the cardiac perspective.  He has upcoming visit with vascular surgery, will defer to vascular surgery to further assess possibility of PAD.   ROS:   He denies chest pain, palpitations, dyspnea, pnd, orthopnea, n, v, dizziness, syncope, edema, weight gain, or early satiety. All other systems reviewed and are otherwise negative except as noted above.    Studies Reviewed EKG Interpretation Date/Time:  Tuesday April 12 2024 08:23:36 EDT Ventricular Rate:  62 PR Interval:  172 QRS Duration:  88 QT Interval:  396 QTC Calculation: 401 R Axis:   58  Text Interpretation: Normal sinus rhythm No significant ST-T wave changes When compared with ECG of 09-Mar-2021 14:07, No significant change was found Confirmed by Ervin Heath [phone]) on 04/12/2024 8:58:50 AM    Cardiac Studies & Procedures   ______________________________________________________________________________________________     ECHOCARDIOGRAM  ECHOCARDIOGRAM COMPLETE 02/22/2018  Narrative *Kristopher* *[facility]* [address] [place], [place] [address] [phone]  ------------------------------------------------------------------- Transthoracic Echocardiography  Patient:    Kristopher, Gay MR #:       [id number] Study Date: 02/22/2018 Gender:     M Age:        40 Height:     188 cm Weight:     113.1 kg BSA:  2.46 m^2 Pt. Status: Room:       6N20C  ORDERING     Kristopher Franco MD REFERRING    Kristopher Franco MD PERFORMING   [facility], Inpatient SONOGRAPHER  Kristopher Gay ADMITTING    Kristopher Gay, Kristopher Gay ATTENDING    Kristopher Gay, Kristopher Gay  cc:  ------------------------------------------------------------------- LV EF: 50% -    55%  ------------------------------------------------------------------- Indications:      Atrial fibrillation - 427.31.  ------------------------------------------------------------------- History:   PMH:   Chronic obstructive pulmonary disease.  Risk factors:  Hypertension.  ------------------------------------------------------------------- Study Conclusions  - Left ventricle: The cavity size was normal. Wall thickness was increased in a pattern of mild LVH. Systolic function was normal. The estimated ejection fraction was in the range of 50% to 55%. Wall motion was normal; there were no regional wall motion abnormalities. Doppler parameters are consistent with abnormal left ventricular relaxation (grade 1 diastolic dysfunction). - Left atrium: The atrium was mildly dilated. - Right ventricle: The cavity size was mildly dilated. - Right atrium: The atrium was mildly dilated. - Tricuspid valve: There was moderate regurgitation. - Pulmonary arteries: Systolic pressure was moderately to severely increased.  Impressions:  - Normal LV systolic function; mild diastolic dysfunction; mild LVH; mild biatrial enlagement; mild RVE; moderate TR; moderate to severe pulmonary hypertension.  ------------------------------------------------------------------- Study data:  Comparison was made to the study of 09/29/2012.  Study status:  Routine.  Procedure:  The patient reported no pain pre or post test. Transthoracic echocardiography. Image quality was adequate.  Study completion:  There were no complications. Transthoracic echocardiography.  M-mode, complete 2D, spectral Doppler, and color Doppler.  Birthdate:  Patient birthdate: 07/19/1951.  Age:  Patient is 73 yr old.  Sex:  Gender: male. BMI: 32 kg/m^2.  Blood pressure:     119/71  Patient status: Inpatient.  Study date:  Study date: 02/22/2018. Study time: 09:26 AM.  Location:  Echo  laboratory.  -------------------------------------------------------------------  ------------------------------------------------------------------- Left ventricle:  The cavity size was normal. Wall thickness was increased in a pattern of mild LVH. Systolic function was normal. The estimated ejection fraction was in the range of 50% to 55%. Wall motion was normal; there were no regional wall motion abnormalities. Doppler parameters are consistent with abnormal left ventricular relaxation (grade 1 diastolic dysfunction).  ------------------------------------------------------------------- Aortic valve:   Trileaflet; mildly thickened leaflets. Mobility was not restricted.  Doppler:  Transvalvular velocity was within the normal range. There was no stenosis. There was no regurgitation.  ------------------------------------------------------------------- Aorta:  Aortic root: The aortic root was normal in size.  ------------------------------------------------------------------- Mitral valve:   Structurally normal valve.   Mobility was not restricted.  Doppler:  Transvalvular velocity was within the normal range. There was no evidence for stenosis. There was trivial regurgitation.  ------------------------------------------------------------------- Left atrium:  The atrium was mildly dilated.  ------------------------------------------------------------------- Right ventricle:  The cavity size was mildly dilated. Systolic function was normal.  ------------------------------------------------------------------- Pulmonic valve:    Doppler:  Transvalvular velocity was within the normal range. There was no evidence for stenosis.  ------------------------------------------------------------------- Tricuspid valve:   Structurally normal valve.    Doppler: Transvalvular velocity was within the normal range. There was moderate  regurgitation.  ------------------------------------------------------------------- Pulmonary artery:   Systolic pressure was moderately to severely increased.  ------------------------------------------------------------------- Right atrium:  The atrium was mildly dilated.  ------------------------------------------------------------------- Pericardium:  There was no pericardial effusion.  ------------------------------------------------------------------- Measurements  Left ventricle  Value        Reference LV ID, ED, PLAX chordal                45    mm     43 - 52 LV ID, ES, PLAX chordal                33    mm     23 - 38 LV fx shortening, PLAX chordal (L)     27    %      >=29 LV PW thickness, ED                    13    mm     ---------- IVS/LV PW ratio, ED                    0.69         <=1.3 Stroke volume, 2D                      107   ml     ---------- Stroke volume/bsa, 2D                  44    ml/m^2 ---------- LV e&', lateral                         12.1  cm/s   ---------- LV E/e&', lateral                       3.89         ---------- LV e&', medial                          7.29  cm/s   ---------- LV E/e&', medial                        6.46         ---------- LV e&', average                         9.7   cm/s   ---------- LV E/e&', average                       4.86         ----------  Ventricular septum                     Value        Reference IVS thickness, ED                      9     mm     ----------  LVOT                                   Value        Reference LVOT ID, S                             24    mm     ---------- LVOT area  4.52  cm^2   ---------- LVOT peak velocity, S                  108   cm/s   ---------- LVOT mean velocity, S                  68.1  cm/s   ---------- LVOT VTI, S                            23.6  cm     ----------  Aorta                                  Value         Reference Aortic root ID, ED                     36.22 mm     ----------  Left atrium                            Value        Reference LA ID, A-P, ES                         46    mm     ---------- LA ID/bsa, A-P                         1.87  cm/m^2 <=2.2 LA volume, S                           82.1  ml     ---------- LA volume/bsa, S                       33.4  ml/m^2 ---------- LA volume, ES, 1-p A4C                 67.3  ml     ---------- LA volume/bsa, ES, 1-p A4C             27.4  ml/m^2 ---------- LA volume, ES, 1-p A2C                 105   ml     ---------- LA volume/bsa, ES, 1-p A2C             42.7  ml/m^2 ----------  Mitral valve                           Value        Reference Mitral E-wave peak velocity            47.1  cm/s   ---------- Mitral A-wave peak velocity            53.4  cm/s   ---------- Mitral deceleration time       (H)     345   ms     150 - 230 Mitral E/A ratio, peak                 0.9          ----------  Tricuspid valve  Value        Reference Tricuspid regurg peak velocity         361   cm/s   ---------- Tricuspid peak RV-RA gradient          52    mm Hg  ----------  Right atrium                           Value        Reference RA ID, S-I, ES, A4C            (H)     68.8  mm     34 - 49 RA area, ES, A4C               (H)     35.8  cm^2   8.3 - 19.5 RA volume, ES, A/L                     147   ml     ---------- RA volume/bsa, ES, A/L                 59.8  ml/m^2 ----------  Right ventricle                        Value        Reference TAPSE                                  17.1  mm     ---------- RV s&', lateral, S                      9.25  cm/s   ----------  Legend: (L)  and  (H)  mark values outside specified reference range.  ------------------------------------------------------------------- Prepared and Electronically Authenticated by  Alexandria Angel 2019-04-29T14:06:42      CT SCANS  CT CORONARY MORPH W/CTA COR  W/SCORE 07/15/2019  Addendum 07/15/2019  4:00 PM ADDENDUM REPORT: 07/15/2019 15:57  ADDENDUM: OVER-READ INTERPRETATION  CT CHEST  The following report is an over-read performed by radiologist Dr. Fate Honor [facility] on 07/15/2019. This over-read does not include interpretation of cardiac or coronary anatomy or pathology. The coronary CTA interpretation by the cardiologist is attached.  COMPARISON:  Chest CT of 05/21/2016  FINDINGS:  Vascular: Normal aortic caliber, without dissection. No central pulmonary embolism, on this non-dedicated study.  Mediastinum/Nodes: No imaged thoracic adenopathy.  Lungs/Pleura: No imaged pleural fluid. Minimal motion degradation in the lower chest.  Bibasilar and lingular subsegmental atelectasis or scar.  Upper Abdomen: Normal imaged portions of the liver, spleen, stomach.  Musculoskeletal: No acute osseous abnormality.  IMPRESSION:  No acute findings in the imaged extracardiac chest.   Electronically Signed By: Lore Rode M.D. On: 07/15/2019 15:57  Narrative CLINICAL DATA:  Chest pain  EXAM: Cardiac CTA  MEDICATIONS: Sub lingual nitro. 4mg  and lopressor  5mg   TECHNIQUE: The patient was scanned on a Siemens Force 192 slice scanner. Gantry rotation speed was 250 msecs. Collimation was .6 mm. A 100 kV prospective scan was triggered in the ascending thoracic aorta at 140 HU's Full mA was used between 35% and 75% of the R-R interval. Average HR during the scan was 58 bpm. The 3D data set was interpreted on a dedicated work station using MPR, MIP and VRT modes. A total  of 80 cc of contrast was used.  FINDINGS: Non-cardiac: See separate report from [facility]. No significant findings on limited lung and soft tissue windows.  Calcium  Score: One area of calcium  noted in mid LAD  Coronary Arteries: Right dominant with no anomalies  LM: Normal  LAD: 1-24% mid LAD stenosis after D2 take off  D1:  Normal  D2: Normal  D3: Normal  Circumflex: Normal  OM1: Normal  AV groove: Normal  RCA: Tortuous in proximal vessel normal  PDA: Normal  PLA: Normal  IMPRESSION: 1. Calcium  score 8 isolated to mid LAD this is 22 nd percentile for age and sex  2.  Normal aortic root diameter 3.7 cm  3.  CAD RADS 1 non obstructive  CAD in mid LAD see description above  Janelle Mediate  Electronically Signed: By: Janelle Mediate M.D. On: 07/15/2019 15:25     ______________________________________________________________________________________________      Risk Assessment/Calculations  CHA2DS2-VASc Score = 2   This indicates a 2.2% annual risk of stroke. The patient's score is based upon: CHF History: 0 HTN History: 1 Diabetes History: 0 Stroke History: 0 Vascular Disease History: 0 Age Score: 1 Gender Score: 0           Physical Exam VS:  BP 109/67   Pulse 62   Ht 6' 3 (1.905 m)   Wt 222 lb 9.6 oz (101 kg)   SpO2 95%   BMI 27.82 kg/m    Wt Readings from Last 3 Encounters:  04/12/24 222 lb 9.6 oz (101 kg)  03/28/24 227 lb 9.6 oz (103.2 kg)  02/23/24 222 lb 12.8 oz (101.1 kg)    GEN: Well nourished, well developed in no acute distress NECK: No JVD; No carotid bruits CARDIAC: RRR, no murmurs, rubs, gallops RESPIRATORY:  Clear to auscultation without rales, wheezing or rhonchi  ABDOMEN: Soft, non-tender, non-distended EXTREMITIES:  No edema; No deformity   ASSESSMENT AND PLAN  Postop atrial fibrillation: Solitary episode, not on anticoagulation therapy.  Only episode documented was after the previous hernia surgery.  He has been doing well without any chest pain or palpitation.  Continue with rate control agent metoprolol  succinate  Hypertension: Blood pressure stable  Hyperlipidemia: On atorvastatin .        Dispo: Follow-up with Dr. Maximo Spar in 1 year  Signed, Pilar Corrales, [name]

## 2024-04-14 NOTE — Telephone Encounter (Unsigned)
 Copied from CRM [id number]. Topic: Clinical - Prescription Issue >> Apr 14, 2024  1:04 PM Baldo Levan wrote: Reason for CRM: Patient is calling in regarding the montelukast  (SINGULAIR ) 10 MG tablet [[id number]], patient requested the refill on 06/13 and is calling because it has not be filled yet. Medication is showing pending

## 2024-04-15 ENCOUNTER — Other Ambulatory Visit: Payer: Self-pay | Admitting: Radiology

## 2024-04-15 DIAGNOSIS — J302 Other seasonal allergic rhinitis: Secondary | ICD-10-CM

## 2024-04-15 MED ORDER — MONTELUKAST SODIUM 10 MG PO TABS
ORAL_TABLET | ORAL | 3 refills | Status: AC
Start: 2024-04-15 — End: ?

## 2024-04-18 ENCOUNTER — Other Ambulatory Visit: Payer: Self-pay

## 2024-04-18 DIAGNOSIS — I739 Peripheral vascular disease, unspecified: Secondary | ICD-10-CM

## 2024-05-02 ENCOUNTER — Encounter: Payer: Self-pay | Admitting: Surgery

## 2024-05-02 ENCOUNTER — Ambulatory Visit (HOSPITAL_COMMUNITY)
Admission: RE | Admit: 2024-05-02 | Discharge: 2024-05-02 | Disposition: A | Discharge status: Home / Self Care | Source: Ambulatory Visit | Attending: Surgery | Admitting: Surgery

## 2024-05-02 ENCOUNTER — Ambulatory Visit: Admitting: Surgery

## 2024-05-02 VITALS — BP 124/63 | HR 60 | Temp 98.7°F | Ht 74.0 in | Wt 220.0 lb

## 2024-05-02 DIAGNOSIS — I739 Peripheral vascular disease, unspecified: Secondary | ICD-10-CM | POA: Diagnosis not present

## 2024-05-02 LAB — VAS US ABI WITH/WO TBI
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
## 2024-05-04 ENCOUNTER — Other Ambulatory Visit: Payer: Self-pay

## 2024-05-04 DIAGNOSIS — I872 Venous insufficiency (chronic) (peripheral): Secondary | ICD-10-CM

## 2024-05-20 ENCOUNTER — Telehealth: Payer: Self-pay | Admitting: Registered Nurse

## 2024-05-20 NOTE — Telephone Encounter (Signed)
 Pt brought in application for renewal of his parking placard for [place] to fill out. Includes note with information on his condition over the last several years. It will be on her desk.

## 2024-05-30 ENCOUNTER — Ambulatory Visit (HOSPITAL_COMMUNITY)
Admission: RE | Admit: 2024-05-30 | Discharge: 2024-05-30 | Disposition: A | Discharge status: Home / Self Care | Source: Ambulatory Visit | Attending: Surgery | Admitting: Surgery

## 2024-05-30 ENCOUNTER — Ambulatory Visit: Attending: Surgery | Admitting: Physician Assistant

## 2024-05-30 VITALS — BP 112/69 | HR 54 | Temp 97.7°F | Wt 213.6 lb

## 2024-05-30 DIAGNOSIS — I872 Venous insufficiency (chronic) (peripheral): Secondary | ICD-10-CM | POA: Insufficient documentation
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
## 2024-06-21 ENCOUNTER — Other Ambulatory Visit: Payer: Self-pay | Admitting: Family Medicine

## 2024-06-21 DIAGNOSIS — E785 Hyperlipidemia, unspecified: Secondary | ICD-10-CM

## 2024-06-30 ENCOUNTER — Other Ambulatory Visit: Payer: Self-pay | Admitting: Internal Medicine

## 2024-06-30 ENCOUNTER — Other Ambulatory Visit: Payer: Self-pay | Admitting: Registered Nurse

## 2024-07-14 ENCOUNTER — Other Ambulatory Visit: Payer: Self-pay | Admitting: Physical Medicine & Rehabilitation

## 2024-08-09 ENCOUNTER — Other Ambulatory Visit: Payer: Self-pay

## 2024-08-09 DIAGNOSIS — E785 Hyperlipidemia, unspecified: Secondary | ICD-10-CM

## 2024-08-09 MED ORDER — ATORVASTATIN CALCIUM 40 MG PO TABS: ORAL_TABLET | ORAL | 3 refills | Status: AC

## 2024-08-11 ENCOUNTER — Encounter: Payer: Self-pay | Admitting: Registered Nurse

## 2024-08-11 ENCOUNTER — Encounter: Attending: Registered Nurse | Admitting: Registered Nurse

## 2024-08-11 VITALS — BP 111/64 | HR 63 | Ht 74.0 in | Wt 218.8 lb

## 2024-08-11 DIAGNOSIS — M25561 Pain in right knee: Secondary | ICD-10-CM | POA: Insufficient documentation

## 2024-08-11 DIAGNOSIS — M546 Pain in thoracic spine: Secondary | ICD-10-CM | POA: Insufficient documentation

## 2024-08-11 DIAGNOSIS — S8410XS Injury of peroneal nerve at lower leg level, unspecified leg, sequela: Secondary | ICD-10-CM | POA: Insufficient documentation

## 2024-08-11 DIAGNOSIS — Z79891 Long term (current) use of opiate analgesic: Secondary | ICD-10-CM | POA: Diagnosis not present

## 2024-08-11 DIAGNOSIS — M25512 Pain in left shoulder: Secondary | ICD-10-CM | POA: Diagnosis not present

## 2024-08-11 DIAGNOSIS — Z5181 Encounter for therapeutic drug level monitoring: Secondary | ICD-10-CM | POA: Insufficient documentation

## 2024-08-11 DIAGNOSIS — M25562 Pain in left knee: Secondary | ICD-10-CM | POA: Insufficient documentation

## 2024-08-11 DIAGNOSIS — G894 Chronic pain syndrome: Secondary | ICD-10-CM | POA: Diagnosis not present

## 2024-08-11 DIAGNOSIS — G629 Polyneuropathy, unspecified: Secondary | ICD-10-CM | POA: Diagnosis present

## 2024-08-11 DIAGNOSIS — G8929 Other chronic pain: Secondary | ICD-10-CM | POA: Diagnosis not present

## 2024-08-11 DIAGNOSIS — M25511 Pain in right shoulder: Secondary | ICD-10-CM | POA: Insufficient documentation

## 2024-08-11 DIAGNOSIS — M47817 Spondylosis without myelopathy or radiculopathy, lumbosacral region: Secondary | ICD-10-CM | POA: Insufficient documentation

## 2024-08-11 MED ORDER — TRAMADOL HCL 50 MG PO TABS: ORAL_TABLET | ORAL | 5 refills | Status: AC
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
## 2024-08-25 ENCOUNTER — Ambulatory Visit: Admitting: Emergency Medicine

## 2024-08-25 ENCOUNTER — Encounter: Payer: Self-pay | Admitting: Emergency Medicine

## 2024-08-25 VITALS — BP 100/80 | HR 67 | Temp 97.9°F | Ht 74.0 in | Wt 222.0 lb

## 2024-08-25 DIAGNOSIS — K219 Gastro-esophageal reflux disease without esophagitis: Secondary | ICD-10-CM | POA: Diagnosis not present

## 2024-08-25 DIAGNOSIS — G894 Chronic pain syndrome: Secondary | ICD-10-CM | POA: Diagnosis not present

## 2024-08-25 DIAGNOSIS — I739 Peripheral vascular disease, unspecified: Secondary | ICD-10-CM | POA: Diagnosis not present

## 2024-08-25 DIAGNOSIS — I480 Paroxysmal atrial fibrillation: Secondary | ICD-10-CM | POA: Diagnosis not present

## 2024-08-25 NOTE — Assessment & Plan Note (Addendum)
 On multiple chronic pain medications handled by pain management clinic  States medications not helping as much as before Advised to follow-up with clinic to discuss this issue

## 2024-08-25 NOTE — Assessment & Plan Note (Signed)
 Clinically stable.  No medications.

## 2024-08-25 NOTE — Assessment & Plan Note (Signed)
 On 05/02/2024 he was able to see vascular surgeon whose assessment and plan were as follows: Leg pain: I discussed with the patient that I do not feel his arterial insufficiency is contributing to any of his leg pain.  He has biphasic waveforms bilaterally despite noncompressible vessels.  He also has palpable pedal pulses.   Leg swelling: Patient suffers from bilateral edema with brawny discoloration of both lower extremities.  He does have a history of a venous stasis ulcer that required wound center and compression to heal.  I have recommended formal venous reflux testing to see if he would be a candidate for laser ablation.  In the meantime he should continue wearing his 20-30 compression socks.  He should continue to keep his legs elevated and to exercise is much as possible.  He will be following up in the [facility] clinic due to my lack of availability in the next few weeks.  If his reflux study is unremarkable, he should be encouraged just to continue her compression stockings.  If he does appear to be a candidate for laser ablation he can follow-up in my office after that.     Malvina Serene CLORE, MD, FACS [facility] [phone] Pager [phone]
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
## 2024-08-25 NOTE — Assessment & Plan Note (Signed)
 Normal sinus rhythm today Continue metoprolol  succinate 25 mg daily

## 2024-08-25 NOTE — Patient Instructions (Signed)
 Health Maintenance After Age 73 After age 27, you are at a higher risk for certain long-term diseases and infections as well as injuries from falls. Falls are a major cause of broken bones and head injuries in people who are older than age 73. Getting regular preventive care can help to keep you healthy and well. Preventive care includes getting regular testing and making lifestyle changes as recommended by your health care provider. Talk with your health care provider about: Which screenings and tests you should have. A screening is a test that checks for a disease when you have no symptoms. A diet and exercise plan that is right for you. What should I know about screenings and tests to prevent falls? Screening and testing are the best ways to find a health problem early. Early diagnosis and treatment give you the best chance of managing medical conditions that are common after age 90. Certain conditions and lifestyle choices may make you more likely to have a fall. Your health care provider may recommend: Regular vision checks. Poor vision and conditions such as cataracts can make you more likely to have a fall. If you wear glasses, make sure to get your prescription updated if your vision changes. Medicine review. Work with your health care provider to regularly review all of the medicines you are taking, including over-the-counter medicines. Ask your health care provider about any side effects that may make you more likely to have a fall. Tell your health care provider if any medicines that you take make you feel dizzy or sleepy. Strength and balance checks. Your health care provider may recommend certain tests to check your strength and balance while standing, walking, or changing positions. Foot health exam. Foot pain and numbness, as well as not wearing proper footwear, can make you more likely to have a fall. Screenings, including: Osteoporosis screening. Osteoporosis is a condition that causes  the bones to get weaker and break more easily. Blood pressure screening. Blood pressure changes and medicines to control blood pressure can make you feel dizzy. Depression screening. You may be more likely to have a fall if you have a fear of falling, feel depressed, or feel unable to do activities that you used to do. Alcohol  use screening. Using too much alcohol  can affect your balance and may make you more likely to have a fall. Follow these instructions at home: Lifestyle Do not drink alcohol  if: Your health care provider tells you not to drink. If you drink alcohol : Limit how much you have to: 0-1 drink a day for women. 0-2 drinks a day for men. Know how much alcohol  is in your drink. In the [place] one drink equals one 12 oz bottle of beer (355 mL), one 5 oz glass of wine (148 mL), or one 1 oz glass of hard liquor (44 mL). Do not use any products that contain nicotine or tobacco. These products include cigarettes, chewing tobacco, and vaping devices, such as e-cigarettes. If you need help quitting, ask your health care provider. Activity  Follow a regular exercise program to stay fit. This will help you maintain your balance. Ask your health care provider what types of exercise are appropriate for you. If you need a cane or walker, use it as recommended by your health care provider. Wear supportive shoes that have nonskid soles. Safety  Remove any tripping hazards, such as rugs, cords, and clutter. Install safety equipment such as grab bars in bathrooms and safety rails on stairs. Keep rooms and walkways  well-lit. General instructions Talk with your health care provider about your risks for falling. Tell your health care provider if: You fall. Be sure to tell your health care provider about all falls, even ones that seem minor. You feel dizzy, tiredness (fatigue), or off-balance. Take over-the-counter and prescription medicines only as told by your health care provider. These include  supplements. Eat a healthy diet and maintain a healthy weight. A healthy diet includes low-fat dairy products, low-fat (lean) meats, and fiber from whole grains, beans, and lots of fruits and vegetables. Stay current with your vaccines. Schedule regular health, dental, and eye exams. Summary Having a healthy lifestyle and getting preventive care can help to protect your health and wellness after age 15. Screening and testing are the best way to find a health problem early and help you avoid having a fall. Early diagnosis and treatment give you the best chance for managing medical conditions that are more common for people who are older than age 42. Falls are a major cause of broken bones and head injuries in people who are older than age 64. Take precautions to prevent a fall at home. Work with your health care provider to learn what changes you can make to improve your health and wellness and to prevent falls. This information is not intended to replace advice given to you by your health care provider. Make sure you discuss any questions you have with your health care provider. Document Revised: 03/04/2021 Document Reviewed: 03/04/2021 Elsevier Patient Education  2024 [employer].

## 2024-09-01 ENCOUNTER — Other Ambulatory Visit: Payer: Self-pay

## 2024-09-01 MED ORDER — TRELEGY ELLIPTA 100-62.5-25 MCG/ACT IN AEPB: INHALATION_SPRAY | RESPIRATORY_TRACT | 0 refills | Status: DC

## 2024-09-01 NOTE — Telephone Encounter (Signed)
 [facility] refill request for Trelegy 100 mcg.  Per chart note from 12/24/2023 Dr. Neysa:  Return in about 1 year (around 12/23/2024). We can continue Trelegy       Ok refill x 1 (90 days supply)

## 2024-09-06 ENCOUNTER — Other Ambulatory Visit: Payer: Self-pay

## 2024-09-06 NOTE — Telephone Encounter (Addendum)
 Fax from [facility] for Trelegy 100 mcg.  Patient last OV   [facility] refill request for Trelegy 100 mcg.  Per chart note from 12/24/2023 Dr. Neysa:   Return in about 1 year (around 12/23/2024). We can continue Trelegy         Ok refill x 1 (90 days supply) THIS WAS REFILLED ON 09/01/2024

## 2024-09-20 ENCOUNTER — Other Ambulatory Visit: Payer: Self-pay | Admitting: Emergency Medicine

## 2024-09-20 DIAGNOSIS — R609 Edema, unspecified: Secondary | ICD-10-CM

## 2024-11-02 ENCOUNTER — Encounter: Payer: Self-pay | Admitting: Internal Medicine

## 2024-11-30 ENCOUNTER — Other Ambulatory Visit: Payer: Self-pay | Admitting: Internal Medicine

## 2024-12-02 NOTE — Telephone Encounter (Signed)
 Yes, TOC appt with Dr. Annella on 12/26/24.

## 2024-12-02 NOTE — Telephone Encounter (Signed)
 Previous Dr. Neysa patient requesting refill of Trelegy. Routing to Dr. Theodoro to advise as Dr. Neysa has retired.   Per 12/24/23 OV notes: We can continue Trelegy

## 2024-12-26 ENCOUNTER — Ambulatory Visit: Payer: Medicare PPO | Admitting: Internal Medicine

## 2024-12-26 ENCOUNTER — Encounter: Admitting: Pulmonary Disease

## 2025-02-09 ENCOUNTER — Encounter: Admitting: Registered Nurse

## 2025-03-30 ENCOUNTER — Ambulatory Visit
